# Patient Record
Sex: Female | Born: 1955 | Race: Black or African American | Hispanic: No | Marital: Single | Smoking: Never smoker
Health system: Southern US, Community
[De-identification: ages and names within clinical notes are randomized; demographics above are authoritative.]

## PROBLEM LIST (undated history)

## (undated) DIAGNOSIS — M1990 Unspecified osteoarthritis, unspecified site: Secondary | ICD-10-CM

## (undated) DIAGNOSIS — J189 Pneumonia, unspecified organism: Secondary | ICD-10-CM

## (undated) DIAGNOSIS — R0600 Dyspnea, unspecified: Secondary | ICD-10-CM

## (undated) DIAGNOSIS — E7800 Pure hypercholesterolemia, unspecified: Secondary | ICD-10-CM

## (undated) DIAGNOSIS — J45909 Unspecified asthma, uncomplicated: Secondary | ICD-10-CM

## (undated) DIAGNOSIS — K219 Gastro-esophageal reflux disease without esophagitis: Secondary | ICD-10-CM

## (undated) DIAGNOSIS — I10 Essential (primary) hypertension: Secondary | ICD-10-CM

## (undated) HISTORY — PX: OTHER SURGICAL HISTORY: SHX169

## (undated) HISTORY — PX: ABDOMINAL HYSTERECTOMY: SHX81

---

## 1997-12-05 ENCOUNTER — Emergency Department (HOSPITAL_COMMUNITY): Admission: EM | Admit: 1997-12-05 | Discharge: 1997-12-05 | Payer: Self-pay | Admitting: Emergency Medicine

## 1998-01-19 ENCOUNTER — Ambulatory Visit (HOSPITAL_COMMUNITY): Admission: RE | Admit: 1998-01-19 | Discharge: 1998-01-19 | Payer: Self-pay | Admitting: Family Medicine

## 1998-05-31 ENCOUNTER — Ambulatory Visit (HOSPITAL_COMMUNITY): Admission: RE | Admit: 1998-05-31 | Discharge: 1998-05-31 | Payer: Self-pay | Admitting: Otolaryngology

## 1999-01-23 ENCOUNTER — Ambulatory Visit (HOSPITAL_COMMUNITY): Admission: RE | Admit: 1999-01-23 | Discharge: 1999-01-23 | Payer: Self-pay | Admitting: Family Medicine

## 2000-08-20 ENCOUNTER — Encounter: Payer: Self-pay | Admitting: Emergency Medicine

## 2000-08-20 ENCOUNTER — Emergency Department (HOSPITAL_COMMUNITY): Admission: EM | Admit: 2000-08-20 | Discharge: 2000-08-20 | Payer: Self-pay | Admitting: Emergency Medicine

## 2004-05-08 ENCOUNTER — Ambulatory Visit: Payer: Self-pay | Admitting: Family Medicine

## 2004-05-29 ENCOUNTER — Ambulatory Visit: Payer: Self-pay | Admitting: Internal Medicine

## 2004-06-08 ENCOUNTER — Ambulatory Visit: Payer: Self-pay | Admitting: *Deleted

## 2004-07-23 ENCOUNTER — Ambulatory Visit: Payer: Self-pay | Admitting: Family Medicine

## 2004-09-13 ENCOUNTER — Ambulatory Visit: Payer: Self-pay | Admitting: Family Medicine

## 2004-09-21 ENCOUNTER — Ambulatory Visit: Payer: Self-pay | Admitting: Family Medicine

## 2004-12-31 ENCOUNTER — Ambulatory Visit: Payer: Self-pay | Admitting: Family Medicine

## 2005-03-07 ENCOUNTER — Ambulatory Visit: Payer: Self-pay | Admitting: Internal Medicine

## 2005-04-29 ENCOUNTER — Ambulatory Visit: Payer: Self-pay | Admitting: Internal Medicine

## 2005-06-14 ENCOUNTER — Ambulatory Visit: Payer: Self-pay | Admitting: Family Medicine

## 2005-12-09 ENCOUNTER — Ambulatory Visit: Payer: Self-pay | Admitting: Family Medicine

## 2005-12-18 ENCOUNTER — Ambulatory Visit (HOSPITAL_COMMUNITY): Admission: RE | Admit: 2005-12-18 | Discharge: 2005-12-18 | Payer: Self-pay | Admitting: Family Medicine

## 2005-12-25 ENCOUNTER — Ambulatory Visit: Payer: Self-pay | Admitting: Family Medicine

## 2006-05-01 ENCOUNTER — Ambulatory Visit: Payer: Self-pay | Admitting: Family Medicine

## 2006-06-19 ENCOUNTER — Ambulatory Visit: Payer: Self-pay | Admitting: Family Medicine

## 2006-08-19 ENCOUNTER — Ambulatory Visit: Payer: Self-pay | Admitting: Family Medicine

## 2006-10-14 ENCOUNTER — Ambulatory Visit: Payer: Self-pay | Admitting: Family Medicine

## 2007-01-29 ENCOUNTER — Ambulatory Visit: Payer: Self-pay | Admitting: Internal Medicine

## 2007-03-02 DIAGNOSIS — I10 Essential (primary) hypertension: Secondary | ICD-10-CM

## 2007-03-02 DIAGNOSIS — E785 Hyperlipidemia, unspecified: Secondary | ICD-10-CM

## 2007-03-02 DIAGNOSIS — J45909 Unspecified asthma, uncomplicated: Secondary | ICD-10-CM | POA: Insufficient documentation

## 2007-03-02 DIAGNOSIS — J309 Allergic rhinitis, unspecified: Secondary | ICD-10-CM

## 2007-03-02 DIAGNOSIS — K219 Gastro-esophageal reflux disease without esophagitis: Secondary | ICD-10-CM

## 2007-05-18 ENCOUNTER — Ambulatory Visit: Payer: Self-pay | Admitting: Internal Medicine

## 2007-05-20 ENCOUNTER — Encounter (INDEPENDENT_AMBULATORY_CARE_PROVIDER_SITE_OTHER): Payer: Self-pay | Admitting: *Deleted

## 2007-06-24 ENCOUNTER — Ambulatory Visit: Payer: Self-pay | Admitting: Internal Medicine

## 2007-07-09 ENCOUNTER — Ambulatory Visit: Payer: Self-pay | Admitting: Internal Medicine

## 2007-09-28 ENCOUNTER — Ambulatory Visit: Payer: Self-pay | Admitting: Internal Medicine

## 2008-01-05 ENCOUNTER — Encounter (INDEPENDENT_AMBULATORY_CARE_PROVIDER_SITE_OTHER): Payer: Self-pay | Admitting: Family Medicine

## 2008-01-05 ENCOUNTER — Ambulatory Visit: Payer: Self-pay | Admitting: Internal Medicine

## 2008-01-05 LAB — CONVERTED CEMR LAB
ALT: 9 units/L (ref 0–35)
BUN: 19 mg/dL (ref 6–23)
Basophils Absolute: 0 10*3/uL (ref 0.0–0.1)
CO2: 27 meq/L (ref 19–32)
Calcium: 10.1 mg/dL (ref 8.4–10.5)
Chloride: 100 meq/L (ref 96–112)
Cholesterol: 164 mg/dL (ref 0–200)
Creatinine, Ser: 0.8 mg/dL (ref 0.40–1.20)
Glucose, Bld: 73 mg/dL (ref 70–99)
HCT: 38.9 % (ref 36.0–46.0)
Hemoglobin: 12.5 g/dL (ref 12.0–15.0)
Lymphocytes Relative: 36 % (ref 12–46)
Lymphs Abs: 2.4 10*3/uL (ref 0.7–4.0)
Monocytes Absolute: 0.5 10*3/uL (ref 0.1–1.0)
Monocytes Relative: 8 % (ref 3–12)
Neutro Abs: 3.2 10*3/uL (ref 1.7–7.7)
RBC: 3.95 M/uL (ref 3.87–5.11)
Total CHOL/HDL Ratio: 2
Triglycerides: 87 mg/dL (ref <150)

## 2008-01-07 ENCOUNTER — Ambulatory Visit (HOSPITAL_COMMUNITY): Admission: RE | Admit: 2008-01-07 | Discharge: 2008-01-07 | Payer: Self-pay | Admitting: Family Medicine

## 2008-01-29 ENCOUNTER — Ambulatory Visit: Payer: Self-pay | Admitting: Internal Medicine

## 2008-02-09 ENCOUNTER — Ambulatory Visit: Payer: Self-pay | Admitting: Internal Medicine

## 2008-05-02 ENCOUNTER — Ambulatory Visit: Payer: Self-pay | Admitting: Family Medicine

## 2008-05-16 ENCOUNTER — Ambulatory Visit: Payer: Self-pay | Admitting: Internal Medicine

## 2008-07-05 ENCOUNTER — Emergency Department (HOSPITAL_COMMUNITY): Admission: EM | Admit: 2008-07-05 | Discharge: 2008-07-05 | Payer: Self-pay | Admitting: Family Medicine

## 2008-07-13 ENCOUNTER — Ambulatory Visit: Payer: Self-pay | Admitting: Internal Medicine

## 2008-07-13 ENCOUNTER — Encounter (INDEPENDENT_AMBULATORY_CARE_PROVIDER_SITE_OTHER): Payer: Self-pay | Admitting: Adult Health

## 2008-07-13 LAB — CONVERTED CEMR LAB
AST: 19 units/L (ref 0–37)
Alkaline Phosphatase: 67 units/L (ref 39–117)
BUN: 7 mg/dL (ref 6–23)
Basophils Relative: 1 % (ref 0–1)
Creatinine, Ser: 0.84 mg/dL (ref 0.40–1.20)
Eosinophils Absolute: 1.5 10*3/uL — ABNORMAL HIGH (ref 0.0–0.7)
MCHC: 33.5 g/dL (ref 30.0–36.0)
MCV: 93.9 fL (ref 78.0–100.0)
Monocytes Absolute: 0.3 10*3/uL (ref 0.1–1.0)
Monocytes Relative: 5 % (ref 3–12)
Neutrophils Relative %: 40 % — ABNORMAL LOW (ref 43–77)
Potassium: 3.4 meq/L — ABNORMAL LOW (ref 3.5–5.3)
RBC: 4.1 M/uL (ref 3.87–5.11)

## 2008-09-28 ENCOUNTER — Ambulatory Visit: Payer: Self-pay | Admitting: Internal Medicine

## 2008-09-28 ENCOUNTER — Encounter (INDEPENDENT_AMBULATORY_CARE_PROVIDER_SITE_OTHER): Payer: Self-pay | Admitting: Adult Health

## 2008-10-11 ENCOUNTER — Ambulatory Visit (HOSPITAL_COMMUNITY): Admission: RE | Admit: 2008-10-11 | Discharge: 2008-10-11 | Payer: Self-pay | Admitting: Family Medicine

## 2008-10-13 ENCOUNTER — Ambulatory Visit: Payer: Self-pay | Admitting: Internal Medicine

## 2008-10-13 ENCOUNTER — Encounter (INDEPENDENT_AMBULATORY_CARE_PROVIDER_SITE_OTHER): Payer: Self-pay | Admitting: Adult Health

## 2008-10-13 LAB — CONVERTED CEMR LAB
Albumin: 4.2 g/dL (ref 3.5–5.2)
CO2: 23 meq/L (ref 19–32)
Cholesterol: 172 mg/dL (ref 0–200)
Glucose, Bld: 83 mg/dL (ref 70–99)
Potassium: 3.6 meq/L (ref 3.5–5.3)
Sodium: 138 meq/L (ref 135–145)
Total Protein: 7.5 g/dL (ref 6.0–8.3)
Triglycerides: 59 mg/dL (ref <150)

## 2008-12-26 ENCOUNTER — Ambulatory Visit: Payer: Self-pay | Admitting: Internal Medicine

## 2009-01-20 ENCOUNTER — Ambulatory Visit: Payer: Self-pay | Admitting: Family Medicine

## 2009-01-31 ENCOUNTER — Ambulatory Visit: Payer: Self-pay | Admitting: Internal Medicine

## 2009-02-02 ENCOUNTER — Emergency Department (HOSPITAL_COMMUNITY): Admission: EM | Admit: 2009-02-02 | Discharge: 2009-02-02 | Payer: Self-pay | Admitting: Emergency Medicine

## 2009-02-09 ENCOUNTER — Ambulatory Visit: Payer: Self-pay | Admitting: Internal Medicine

## 2009-03-27 ENCOUNTER — Ambulatory Visit: Payer: Self-pay | Admitting: Internal Medicine

## 2009-05-22 ENCOUNTER — Encounter (INDEPENDENT_AMBULATORY_CARE_PROVIDER_SITE_OTHER): Payer: Self-pay | Admitting: Adult Health

## 2009-05-22 ENCOUNTER — Ambulatory Visit: Payer: Self-pay | Admitting: Internal Medicine

## 2009-05-22 LAB — CONVERTED CEMR LAB: Potassium: 3.9 meq/L (ref 3.5–5.3)

## 2009-06-15 ENCOUNTER — Ambulatory Visit: Payer: Self-pay | Admitting: Family Medicine

## 2009-06-21 ENCOUNTER — Emergency Department (HOSPITAL_COMMUNITY): Admission: EM | Admit: 2009-06-21 | Discharge: 2009-06-21 | Payer: Self-pay | Admitting: Emergency Medicine

## 2009-06-30 ENCOUNTER — Ambulatory Visit: Payer: Self-pay | Admitting: Internal Medicine

## 2009-06-30 ENCOUNTER — Encounter (INDEPENDENT_AMBULATORY_CARE_PROVIDER_SITE_OTHER): Payer: Self-pay | Admitting: Adult Health

## 2009-06-30 LAB — CONVERTED CEMR LAB
ALT: 11 units/L (ref 0–35)
AST: 19 units/L (ref 0–37)
Albumin: 4.4 g/dL (ref 3.5–5.2)
Alkaline Phosphatase: 53 units/L (ref 39–117)
BUN: 10 mg/dL (ref 6–23)
Basophils Absolute: 0.1 10*3/uL (ref 0.0–0.1)
Basophils Relative: 1 % (ref 0–1)
CO2: 25 meq/L (ref 19–32)
Calcium: 9.5 mg/dL (ref 8.4–10.5)
Chloride: 102 meq/L (ref 96–112)
Cholesterol: 149 mg/dL (ref 0–200)
Creatinine, Ser: 0.75 mg/dL (ref 0.40–1.20)
Eosinophils Absolute: 1 10*3/uL — ABNORMAL HIGH (ref 0.0–0.7)
Eosinophils Relative: 15 % — ABNORMAL HIGH (ref 0–5)
Glucose, Bld: 76 mg/dL (ref 70–99)
HCT: 38.9 % (ref 36.0–46.0)
HDL: 73 mg/dL (ref >39)
Hemoglobin: 12.8 g/dL (ref 12.0–15.0)
LDL Cholesterol: 55 mg/dL (ref 0–99)
Lymphocytes Relative: 35 % (ref 12–46)
Lymphs Abs: 2.3 10*3/uL (ref 0.7–4.0)
MCHC: 32.9 g/dL (ref 30.0–36.0)
MCV: 99 fL (ref 78.0–100.0)
Microalb, Ur: 0.5 mg/dL (ref 0.00–1.89)
Monocytes Absolute: 0.4 10*3/uL (ref 0.1–1.0)
Monocytes Relative: 6 % (ref 3–12)
Neutro Abs: 2.8 10*3/uL (ref 1.7–7.7)
Neutrophils Relative %: 43 % (ref 43–77)
Platelets: 443 10*3/uL — ABNORMAL HIGH (ref 150–400)
Potassium: 3.2 meq/L — ABNORMAL LOW (ref 3.5–5.3)
RBC: 3.93 M/uL (ref 3.87–5.11)
RDW: 14.5 % (ref 11.5–15.5)
Sodium: 141 meq/L (ref 135–145)
Total Bilirubin: 0.6 mg/dL (ref 0.3–1.2)
Total CHOL/HDL Ratio: 2
Total Protein: 7.2 g/dL (ref 6.0–8.3)
Triglycerides: 106 mg/dL (ref <150)
VLDL: 21 mg/dL (ref 0–40)
WBC: 6.6 10*3/uL (ref 4.0–10.5)

## 2009-07-01 ENCOUNTER — Encounter (INDEPENDENT_AMBULATORY_CARE_PROVIDER_SITE_OTHER): Payer: Self-pay | Admitting: Adult Health

## 2009-09-22 ENCOUNTER — Ambulatory Visit: Payer: Self-pay | Admitting: Internal Medicine

## 2009-10-26 ENCOUNTER — Ambulatory Visit: Payer: Self-pay | Admitting: Internal Medicine

## 2009-12-14 ENCOUNTER — Ambulatory Visit: Payer: Self-pay | Admitting: Family Medicine

## 2009-12-14 ENCOUNTER — Telehealth (INDEPENDENT_AMBULATORY_CARE_PROVIDER_SITE_OTHER): Payer: Self-pay | Admitting: *Deleted

## 2009-12-25 ENCOUNTER — Ambulatory Visit (HOSPITAL_COMMUNITY): Admission: RE | Admit: 2009-12-25 | Discharge: 2009-12-25 | Payer: Self-pay | Admitting: Family Medicine

## 2010-01-23 ENCOUNTER — Ambulatory Visit: Payer: Self-pay | Admitting: Internal Medicine

## 2010-01-25 ENCOUNTER — Ambulatory Visit (HOSPITAL_COMMUNITY): Admission: RE | Admit: 2010-01-25 | Discharge: 2010-01-25 | Payer: Self-pay | Admitting: Internal Medicine

## 2010-02-14 ENCOUNTER — Ambulatory Visit: Payer: Self-pay | Admitting: Internal Medicine

## 2010-02-20 ENCOUNTER — Encounter (INDEPENDENT_AMBULATORY_CARE_PROVIDER_SITE_OTHER): Payer: Self-pay | Admitting: Adult Health

## 2010-02-20 ENCOUNTER — Ambulatory Visit: Payer: Self-pay | Admitting: Internal Medicine

## 2010-02-20 LAB — CONVERTED CEMR LAB
ALT: 12 units/L (ref 0–35)
AST: 19 units/L (ref 0–37)
Albumin: 4.5 g/dL (ref 3.5–5.2)
Alkaline Phosphatase: 59 units/L (ref 39–117)
Calcium: 9.6 mg/dL (ref 8.4–10.5)
Chloride: 99 meq/L (ref 96–112)
LDL Cholesterol: 97 mg/dL (ref 0–99)
Potassium: 4.4 meq/L (ref 3.5–5.3)
Sodium: 135 meq/L (ref 135–145)
Total Protein: 7.3 g/dL (ref 6.0–8.3)
Vit D, 25-Hydroxy: 22 ng/mL — ABNORMAL LOW (ref 30–89)

## 2010-02-28 ENCOUNTER — Ambulatory Visit: Payer: Self-pay | Admitting: Internal Medicine

## 2010-03-07 ENCOUNTER — Ambulatory Visit (HOSPITAL_COMMUNITY): Admission: RE | Admit: 2010-03-07 | Discharge: 2010-03-07 | Payer: Self-pay | Admitting: Internal Medicine

## 2010-03-30 ENCOUNTER — Ambulatory Visit: Payer: Self-pay | Admitting: Internal Medicine

## 2010-04-10 ENCOUNTER — Ambulatory Visit: Payer: Self-pay | Admitting: Family Medicine

## 2010-10-02 NOTE — Progress Notes (Signed)
Summary: triage/possible sinus infection  Phone Note Call from Patient   Caller: triage/poss sinus infection Reason for Call: Talk to Nurse Summary of Call: patient has had a cold for over one week now..It started last Wednesday and she is now feeling sinus pressure and is blowing up yellow phlegm..She has a history of asthma and has been using her inhalers..but doesn't think they are working as she is having difficulty breathing.Marland KitchenMarland KitchenShe also has a history of HTN.Marland KitchenShe feels she now has a sinus infection.Marland KitchenMarland KitchenMarland KitchenAppointment made in Acute slot for this afternoon. Initial call taken by: Conchita Paris,  December 14, 2009 9:04 AM

## 2010-11-30 ENCOUNTER — Inpatient Hospital Stay (INDEPENDENT_AMBULATORY_CARE_PROVIDER_SITE_OTHER)
Admission: RE | Admit: 2010-11-30 | Discharge: 2010-11-30 | Disposition: A | Discharge status: Home / Self Care | Payer: Self-pay | Source: Ambulatory Visit | Attending: Family Medicine | Admitting: Family Medicine

## 2010-11-30 DIAGNOSIS — J309 Allergic rhinitis, unspecified: Secondary | ICD-10-CM

## 2010-12-05 ENCOUNTER — Other Ambulatory Visit (HOSPITAL_COMMUNITY): Payer: Self-pay | Admitting: Family Medicine

## 2010-12-05 DIAGNOSIS — J329 Chronic sinusitis, unspecified: Secondary | ICD-10-CM

## 2010-12-06 ENCOUNTER — Encounter (HOSPITAL_COMMUNITY): Payer: Self-pay

## 2010-12-06 ENCOUNTER — Ambulatory Visit (HOSPITAL_COMMUNITY)
Admission: RE | Admit: 2010-12-06 | Discharge: 2010-12-06 | Disposition: A | Discharge status: Home / Self Care | Payer: Self-pay | Source: Ambulatory Visit | Attending: Family Medicine | Admitting: Family Medicine

## 2010-12-06 DIAGNOSIS — K029 Dental caries, unspecified: Secondary | ICD-10-CM | POA: Insufficient documentation

## 2010-12-06 DIAGNOSIS — J329 Chronic sinusitis, unspecified: Secondary | ICD-10-CM

## 2010-12-06 DIAGNOSIS — J328 Other chronic sinusitis: Secondary | ICD-10-CM | POA: Insufficient documentation

## 2010-12-10 LAB — BASIC METABOLIC PANEL
CO2: 25 mEq/L (ref 19–32)
Calcium: 9.1 mg/dL (ref 8.4–10.5)
Chloride: 101 mEq/L (ref 96–112)
Creatinine, Ser: 0.9 mg/dL (ref 0.4–1.2)
Glucose, Bld: 114 mg/dL — ABNORMAL HIGH (ref 70–99)

## 2011-01-15 ENCOUNTER — Inpatient Hospital Stay (INDEPENDENT_AMBULATORY_CARE_PROVIDER_SITE_OTHER)
Admission: RE | Admit: 2011-01-15 | Discharge: 2011-01-15 | Disposition: A | Discharge status: Home / Self Care | Payer: Self-pay | Source: Ambulatory Visit | Attending: Emergency Medicine | Admitting: Emergency Medicine

## 2011-01-15 DIAGNOSIS — K047 Periapical abscess without sinus: Secondary | ICD-10-CM

## 2011-01-15 DIAGNOSIS — J019 Acute sinusitis, unspecified: Secondary | ICD-10-CM

## 2011-02-12 ENCOUNTER — Inpatient Hospital Stay (INDEPENDENT_AMBULATORY_CARE_PROVIDER_SITE_OTHER)
Admission: RE | Admit: 2011-02-12 | Discharge: 2011-02-12 | Disposition: A | Discharge status: Home / Self Care | Payer: Self-pay | Source: Ambulatory Visit | Attending: Family Medicine | Admitting: Family Medicine

## 2011-02-12 DIAGNOSIS — J310 Chronic rhinitis: Secondary | ICD-10-CM

## 2011-02-12 DIAGNOSIS — J019 Acute sinusitis, unspecified: Secondary | ICD-10-CM

## 2011-04-12 ENCOUNTER — Inpatient Hospital Stay (INDEPENDENT_AMBULATORY_CARE_PROVIDER_SITE_OTHER)
Admission: RE | Admit: 2011-04-12 | Discharge: 2011-04-12 | Disposition: A | Discharge status: Home / Self Care | Payer: Self-pay | Source: Ambulatory Visit | Attending: Family Medicine | Admitting: Family Medicine

## 2011-04-12 DIAGNOSIS — J019 Acute sinusitis, unspecified: Secondary | ICD-10-CM

## 2011-04-12 DIAGNOSIS — J310 Chronic rhinitis: Secondary | ICD-10-CM

## 2011-06-08 ENCOUNTER — Inpatient Hospital Stay (INDEPENDENT_AMBULATORY_CARE_PROVIDER_SITE_OTHER)
Admission: RE | Admit: 2011-06-08 | Discharge: 2011-06-08 | Disposition: A | Discharge status: Home / Self Care | Payer: Self-pay | Source: Ambulatory Visit | Attending: Family Medicine | Admitting: Family Medicine

## 2011-06-08 DIAGNOSIS — J019 Acute sinusitis, unspecified: Secondary | ICD-10-CM

## 2012-09-06 ENCOUNTER — Emergency Department (INDEPENDENT_AMBULATORY_CARE_PROVIDER_SITE_OTHER)
Admission: EM | Admit: 2012-09-06 | Discharge: 2012-09-06 | Disposition: A | Discharge status: Home / Self Care | Payer: Self-pay | Source: Home / Self Care

## 2012-09-06 ENCOUNTER — Encounter (HOSPITAL_COMMUNITY): Payer: Self-pay | Admitting: *Deleted

## 2012-09-06 DIAGNOSIS — S0180XA Unspecified open wound of other part of head, initial encounter: Secondary | ICD-10-CM

## 2012-09-06 DIAGNOSIS — S0181XA Laceration without foreign body of other part of head, initial encounter: Secondary | ICD-10-CM

## 2012-09-06 DIAGNOSIS — Z23 Encounter for immunization: Secondary | ICD-10-CM

## 2012-09-06 HISTORY — DX: Unspecified asthma, uncomplicated: J45.909

## 2012-09-06 MED ORDER — TETANUS-DIPHTH-ACELL PERTUSSIS 5-2.5-18.5 LF-MCG/0.5 IM SUSP
INTRAMUSCULAR | Status: AC
  Filled 2012-09-06: qty 0.5

## 2012-09-06 MED ORDER — TETANUS-DIPHTH-ACELL PERTUSSIS 5-2.5-18.5 LF-MCG/0.5 IM SUSP
0.5000 mL | Freq: Once | INTRAMUSCULAR | Status: AC
  Administered 2012-09-06: 0.5 mL via INTRAMUSCULAR

## 2012-09-06 NOTE — ED Provider Notes (Signed)
Medical screening examination/treatment/procedure(s) were performed by [facility] fellow and as supervising physician I was immediately available for consultation/collaboration.   Sharin Grave, MD   Sharin Grave, MD 09/06/12 [phone]

## 2012-09-06 NOTE — ED Notes (Signed)
Tripped on porch last night and fell face down.  Laceration to L forehead.  Bleeding stopped.

## 2012-09-06 NOTE — ED Provider Notes (Signed)
Angel Hebert is a 57 y.o. female who presents to Urgent Care today for for head laceration. Patient fell last night at about 6:30 and hit her head on the concrete outside of her house.  She applied compression to control bleeding and feels well overnight.  She denies any headache blurry vision or dizziness and feels well otherwise. She denies any loss of consciousness with the injury. She denies any fevers or chill. Patient cannot remember her last tetanus shot.    PMH reviewed. Asthma, and hypertension History  Substance Use Topics  . Smoking status: Not on file  . Smokeless tobacco: Not on file  . Alcohol Use: Not on file   ROS as above Medications reviewed. Current Facility-Administered Medications  Medication Dose Route Frequency Provider Last Rate Last Dose  . TDaP (BOOSTRIX) injection 0.5 mL  0.5 mL Intramuscular Once Rodolph Bong, MD       No current outpatient prescriptions on file.    Exam:  BP 140/78  Pulse 91  Temp 98.8 F (37.1 C) (Oral)  SpO2 99% Gen: Well NAD FOR HEAD: Small 1 cm linear laceration across the for head on the left side.  It is well approximated and not bleeding.  Neuro: Alert and oriented normally conversant normal gait.   No results found for this or any previous visit (from the past 24 hour(s)). No results found.  Assessment and Plan: 57 y.o. female with for head laceration. Unfortunately her laceration is about 20 hours old and not amenable to primary closure tonight.   The wound was irrigated.  A dressing was applied. Will the wound heal with secondary intent.  Will administer a tetanus posterior today.      Rodolph Bong, MD 09/06/12 [phone]

## 2012-09-06 NOTE — ED Notes (Signed)
Pt states she fell last night hitting head on cement laceration to left side forehead above left eye. Bleeding is controlled. Some swelling, ice applied. Pt is in no distress. Pt seated back in waiting area. Mw,cma

## 2012-09-09 ENCOUNTER — Emergency Department (INDEPENDENT_AMBULATORY_CARE_PROVIDER_SITE_OTHER)
Admission: EM | Admit: 2012-09-09 | Discharge: 2012-09-09 | Disposition: A | Discharge status: Home / Self Care | Payer: Self-pay | Source: Home / Self Care | Attending: Family Medicine | Admitting: Family Medicine

## 2012-09-09 ENCOUNTER — Encounter (HOSPITAL_COMMUNITY): Payer: Self-pay | Admitting: *Deleted

## 2012-09-09 DIAGNOSIS — J329 Chronic sinusitis, unspecified: Secondary | ICD-10-CM

## 2012-09-09 DIAGNOSIS — S0180XA Unspecified open wound of other part of head, initial encounter: Secondary | ICD-10-CM

## 2012-09-09 MED ORDER — PREDNISONE 10 MG PO TABS: ORAL_TABLET | ORAL | Status: DC

## 2012-09-09 MED ORDER — AMOXICILLIN-POT CLAVULANATE 875-125 MG PO TABS: 1.0000 | ORAL_TABLET | Freq: Two times a day (BID) | ORAL | Status: DC

## 2012-09-09 MED ORDER — HYDROCODONE-HOMATROPINE 5-1.5 MG/5ML PO SYRP: 5.0000 mL | ORAL_SOLUTION | Freq: Four times a day (QID) | ORAL | Status: DC | PRN

## 2012-09-09 NOTE — ED Notes (Signed)
Pt  Reports  Symptoms  Of  Nasal  stuffyness   And  Congestion      With  Yellow phlegm     and  Mucous  Production     Since last  Week         Pt  Reports  History of  Asthma         She  Is  Masked  In a  Private  Room  And   Is  Ion no  Severe  distress

## 2012-09-09 NOTE — ED Provider Notes (Signed)
History     CSN: [id number]  Arrival date & time 09/09/12  1122   First MD Initiated Contact with Patient 09/09/12 1329      Chief Complaint  Patient presents with  . URI    (Consider location/radiation/quality/duration/timing/severity/associated sxs/prior treatment) Patient is a 57 y.o. female presenting with cough. The history is provided by the patient. No language interpreter was used.  Cough This is a new problem. The current episode started more than 1 week ago. The problem occurs constantly. The problem has been gradually worsening. The cough is productive of sputum. There has been no fever. Associated symptoms include ear congestion, headaches, rhinorrhea and sore throat.  Pt reports she has a history of sinus problems and polyps.   Pt is requesting rx for augmentin and prednisone.   Pt reports she has had in the past  Past Medical History  Diagnosis Date  . Asthma     Past Surgical History  Procedure Date  . Abdominal hysterectomy     No family history on file.  History  Substance Use Topics  . Smoking status: Never Smoker   . Smokeless tobacco: Not on file  . Alcohol Use: No    OB History    Grav Para Term Preterm Abortions TAB SAB Ect Mult Living                  Review of Systems  HENT: Positive for sore throat and rhinorrhea.   Respiratory: Positive for cough.   Neurological: Positive for headaches.  All other systems reviewed and are negative.    Allergies  Review of patient's allergies indicates no known allergies.  Home Medications   Current Outpatient Rx  Name  Route  Sig  Dispense  Refill  . ALBUTEROL SULFATE HFA 108 (90 BASE) MCG/ACT IN AERS   Inhalation   Inhale 2 puffs into the lungs every 6 (six) hours as needed.         . ATORVASTATIN CALCIUM 10 MG PO TABS   Oral   Take 10 mg by mouth daily.         Marland Kitchen FLUTICASONE-SALMETEROL 500-50 MCG/DOSE IN AEPB   Inhalation   Inhale 1 puff into the lungs every 12 (twelve) hours.        . IRBESARTAN 150 MG PO TABS   Oral   Take 150 mg by mouth daily after breakfast.         . LORATADINE 10 MG PO TABS   Oral   Take 10 mg by mouth daily.         Marland Kitchen MONTELUKAST SODIUM 10 MG PO TABS   Oral   Take 10 mg by mouth at bedtime.         Marland Kitchen PANTOPRAZOLE SODIUM 20 MG PO TBEC   Oral   Take 20 mg by mouth daily.         . TRAMADOL HCL 50 MG PO TABS   Oral   Take 50 mg by mouth every 6 (six) hours as needed.         . TRAZODONE HCL 50 MG PO TABS   Oral   Take 50 mg by mouth at bedtime.           BP 134/86  Pulse 91  Temp 97.8 F (36.6 C) (Oral)  Resp 16  SpO2 97%  Physical Exam  Nursing note and vitals reviewed. Constitutional: She is oriented to person, place, and time. She appears well-developed and well-nourished.  HENT:  Head: Normocephalic and atraumatic.  Right Ear: External ear normal.  Left Ear: External ear normal.  Nose: Nose normal.  Mouth/Throat: Oropharynx is clear and moist.       Tender maxillary sinuses,    Eyes: Conjunctivae normal and EOM are normal. Pupils are equal, round, and reactive to light.  Neck: Normal range of motion. Neck supple.  Cardiovascular: Normal rate and normal heart sounds.   Pulmonary/Chest: Effort normal and breath sounds normal.  Abdominal: Soft. Bowel sounds are normal.  Musculoskeletal: Normal range of motion.  Neurological: She is alert and oriented to person, place, and time. She has normal reflexes.  Skin: Skin is warm.  Psychiatric: She has a normal mood and affect.    ED Course  Procedures (including critical care time)  Labs Reviewed - No data to display No results found.   No diagnosis found.    MDM  augmentin and prednisone taper,   Pt request medication for cough.   Pt given rx for hycodan.       Lonia Skinner [place], [place] 09/09/12 1336

## 2012-09-11 NOTE — ED Provider Notes (Signed)
Medical screening examination/treatment/procedure(s) were performed by non-physician practitioner and as supervising physician I was immediately available for consultation/collaboration.   Hebert,Angel Irving; MD   Sonnie Pawloski Moreno-Coll, MD 09/11/12 0837 

## 2012-10-03 ENCOUNTER — Encounter (HOSPITAL_COMMUNITY): Payer: Self-pay | Admitting: Emergency Medicine

## 2012-10-03 ENCOUNTER — Emergency Department (INDEPENDENT_AMBULATORY_CARE_PROVIDER_SITE_OTHER): Payer: Self-pay

## 2012-10-03 ENCOUNTER — Emergency Department (INDEPENDENT_AMBULATORY_CARE_PROVIDER_SITE_OTHER)
Admission: EM | Admit: 2012-10-03 | Discharge: 2012-10-03 | Disposition: A | Discharge status: Home / Self Care | Payer: Self-pay | Source: Home / Self Care | Attending: Emergency Medicine | Admitting: Emergency Medicine

## 2012-10-03 DIAGNOSIS — M1990 Unspecified osteoarthritis, unspecified site: Secondary | ICD-10-CM

## 2012-10-03 DIAGNOSIS — S0180XA Unspecified open wound of other part of head, initial encounter: Secondary | ICD-10-CM

## 2012-10-03 DIAGNOSIS — Z23 Encounter for immunization: Secondary | ICD-10-CM

## 2012-10-03 DIAGNOSIS — M109 Gout, unspecified: Secondary | ICD-10-CM

## 2012-10-03 DIAGNOSIS — E785 Hyperlipidemia, unspecified: Secondary | ICD-10-CM

## 2012-10-03 DIAGNOSIS — J45909 Unspecified asthma, uncomplicated: Secondary | ICD-10-CM

## 2012-10-03 MED ORDER — MELOXICAM 15 MG PO TABS: 15.0000 mg | ORAL_TABLET | Freq: Every day | ORAL | Status: DC

## 2012-10-03 MED ORDER — ALBUTEROL SULFATE HFA 108 (90 BASE) MCG/ACT IN AERS: 1.0000 | INHALATION_SPRAY | Freq: Four times a day (QID) | RESPIRATORY_TRACT | Status: DC | PRN

## 2012-10-03 MED ORDER — COLCHICINE 0.6 MG PO TABS: ORAL_TABLET | ORAL | Status: DC

## 2012-10-03 MED ORDER — FLUTICASONE-SALMETEROL 500-50 MCG/DOSE IN AEPB: 1.0000 | INHALATION_SPRAY | Freq: Two times a day (BID) | RESPIRATORY_TRACT | Status: DC

## 2012-10-03 NOTE — ED Notes (Addendum)
Pt is here for left knee pain x1 week Sx include: swelling, pain increases w/activity; 10/10 on pain scale Pain is also worse at night Has not taken any meds other than her tramadol  No hx of arthritis  She is alert w/no signs of acute distress.

## 2012-10-03 NOTE — ED Provider Notes (Signed)
Chief Complaint  Patient presents with  . Knee Pain    History of Present Illness:   Angel Hebert is a 57 year old female who has had a one-week history of left knee pain and swelling. She denies any injury. She has had no fever, chills, history of gout, arthritis, or prior knee problems. She has giving way and locking of the joint. It hurts to walk, to stand, going up and down stairs, and with prolonged sitting.  Review of Systems:  Other than noted above, the patient denies any of the following symptoms: Systemic:  No fevers, chills, sweats, or aches.  No fatigue or tiredness. Musculoskeletal:  No joint pain, arthritis, bursitis, swelling, back pain, or neck pain.  Neurological:  No muscular weakness, paresthesias, headache, or trouble with speech or coordination.  No dizziness.   PMFSH:  Past medical history, family history, social history, meds, and allergies were reviewed.  No prior history of knee pain or arthritis.  Physical Exam:   Vital signs:  BP 103/79  Pulse 90  Temp 98.1 F (36.7 C) (Oral)  Resp 18  SpO2 99% Gen:  Alert and oriented times 3.  In no distress. Musculoskeletal: The knee was visibly swollen and has a palpable effusion with ballottement of the patella. There is overall tenderness to palpation, not localized to one particular area. There is no erythema or warmth. The knee has a slightly limited range of motion but she is able to flex past 90. It does hurt to move.   McMurray's test was negative.  Lachman's test was negative.  Anterior drawer test was negative.   Varus and valgus stress negative.  Otherwise, all joints had a full a ROM with no swelling, bruising or deformity.  No edema, pulses full. Extremities were warm and pink.  Capillary refill was brisk.  Skin:  Clear, warm and dry.  No rash. Neuro:  Alert and oriented times 3.  Muscle strength was normal.  Sensation was intact to light touch.   Radiology:  Dg Knee Complete 4 Views Left  10/03/2012  *RADIOLOGY  REPORT*  Clinical Data: Left knee pain and swelling for 1 week, no known injury  LEFT KNEE - COMPLETE 4+ VIEW  Comparison: None.  Findings:  No fracture or dislocation.  Moderate to severe tricompartmental degenerative change, worse within the medial compartment joint space loss, subchondral sclerosis and osteophytosis.  Moderate sized suprapatellar joint effusion.  Enthesopathic change of the superior pole of the patella.  No radiopaque foreign body.  IMPRESSION:  1.  No fracture. 2.  Moderate to severe degenerative changes medial compartment of the knee with associated moderate sized suprapatellar joint effusion.   Original Report Authenticated By: Tacey Ruiz, MD    I reviewed the images independently and personally and concur with the radiologist's findings.  Results for orders placed during the hospital encounter of 10/03/12  URIC ACID      Component Value Range   Uric Acid, Serum 3.6  2.4 - 7.0 mg/dL   Assessment:  The primary encounter diagnosis was Gout. Diagnoses of Osteoarthritis and ASTHMA were also pertinent to this visit.  The uric acid level was not back at that time the patient left, thus I was thinking that gout would be a possible cause for this versus an acute flareup of osteoarthritis. I think it's worthwhile to try a trial of colchicine to see if it works since uric acid can be normal even with a gout attack. I will call back tomorrow to let her  know about this result. She has meloxicam to take for pain and I told her to followup with a primary care physician within the next week. In addition to the albuterol I also refilled her Advair.  Plan:   1.  The following meds were prescribed:   New Prescriptions   ALBUTEROL (PROVENTIL HFA;VENTOLIN HFA) 108 (90 BASE) MCG/ACT INHALER    Inhale 1-2 puffs into the lungs every 6 (six) hours as needed for wheezing.   COLCHICINE 0.6 MG TABLET    Take 2 now and 1 in 1 hour.  May repeat dose once daily.  For gout attack.   MELOXICAM (MOBIC) 15  MG TABLET    Take 1 tablet (15 mg total) by mouth daily.   2.  The patient was instructed in symptomatic care, including rest and activity, elevation, application of ice and compression.  Appropriate handouts were given. 3.  The patient was told to return if becoming worse in any way, if no better in 3 or 4 days, and given some red flag symptoms that would indicate earlier return.   4.  The patient was told to follow up with a primary care physician within a week.    Reuben Likes, MD 10/03/12 [phone]

## 2012-10-05 NOTE — ED Notes (Signed)
Patient called, asking about her lab reports; uric acid level normal range, x-rays show degenerative changes. Discussed medication compliance, and to try to sign up for [facility] card.

## 2012-10-08 ENCOUNTER — Emergency Department (INDEPENDENT_AMBULATORY_CARE_PROVIDER_SITE_OTHER)
Admission: EM | Admit: 2012-10-08 | Discharge: 2012-10-08 | Disposition: A | Discharge status: Home / Self Care | Payer: Self-pay | Source: Home / Self Care | Attending: Family Medicine | Admitting: Family Medicine

## 2012-10-08 ENCOUNTER — Encounter (HOSPITAL_COMMUNITY): Payer: Self-pay

## 2012-10-08 DIAGNOSIS — Z23 Encounter for immunization: Secondary | ICD-10-CM

## 2012-10-08 DIAGNOSIS — E785 Hyperlipidemia, unspecified: Secondary | ICD-10-CM

## 2012-10-08 DIAGNOSIS — I10 Essential (primary) hypertension: Secondary | ICD-10-CM

## 2012-10-08 DIAGNOSIS — S0180XA Unspecified open wound of other part of head, initial encounter: Secondary | ICD-10-CM

## 2012-10-08 LAB — POCT URINALYSIS DIP (DEVICE)
Bilirubin Urine: NEGATIVE
Glucose, UA: NEGATIVE mg/dL
Nitrite: NEGATIVE
Specific Gravity, Urine: 1.015 (ref 1.005–1.030)
Urobilinogen, UA: 0.2 mg/dL (ref 0.0–1.0)
pH: 5.5 (ref 5.0–8.0)

## 2012-10-08 MED ORDER — HYDROCODONE-ACETAMINOPHEN 5-325 MG PO TABS: 1.0000 | ORAL_TABLET | Freq: Four times a day (QID) | ORAL | Status: DC | PRN

## 2012-10-08 MED ORDER — SULFAMETHOXAZOLE-TMP DS 800-160 MG PO TABS: 1.0000 | ORAL_TABLET | Freq: Two times a day (BID) | ORAL | Status: DC

## 2012-10-08 NOTE — ED Provider Notes (Signed)
History   CSN: [id number]  Arrival date & time 10/08/12  1040   First MD Initiated Contact with Patient 10/08/12 1150     Chief Complaint  Patient presents with  . Leg Pain   HPI Pt is having sig pain in the left knee.  Pt says that she hurts all the time. She is taking mobic 15 mg which she got from urgent care.  She says that her symptoms have improved but still having pain and swelling in the knee.  Pt is asking for an orthopedic referral for evaluation.    Past Medical History  Diagnosis Date  . Asthma     Past Surgical History  Procedure Date  . Abdominal hysterectomy     No family history on file.  History  Substance Use Topics  . Smoking status: Never Smoker   . Smokeless tobacco: Not on file  . Alcohol Use: No    OB History    Grav Para Term Preterm Abortions TAB SAB Ect Mult Living                 Review of Systems  Musculoskeletal: Positive for joint swelling and arthralgias.       Left knee pain and swelling   All other systems reviewed and are negative.   Allergies  Review of patient's allergies indicates no known allergies.  Home Medications   Current Outpatient Rx  Name  Route  Sig  Dispense  Refill  . ALBUTEROL SULFATE HFA 108 (90 BASE) MCG/ACT IN AERS   Inhalation   Inhale 2 puffs into the lungs every 6 (six) hours as needed.         . ALBUTEROL SULFATE HFA 108 (90 BASE) MCG/ACT IN AERS   Inhalation   Inhale 1-2 puffs into the lungs every 6 (six) hours as needed for wheezing.   1 Inhaler   2   . AMOXICILLIN-POT CLAVULANATE 875-125 MG PO TABS   Oral   Take 1 tablet by mouth every 12 (twelve) hours.   20 tablet   0   . ATORVASTATIN CALCIUM 10 MG PO TABS   Oral   Take 10 mg by mouth daily.         . COLCHICINE 0.6 MG PO TABS      Take 2 now and 1 in 1 hour.  May repeat dose once daily.  For gout attack.   12 tablet   2   . FLUTICASONE-SALMETEROL 500-50 MCG/DOSE IN AEPB   Inhalation   Inhale 1 puff into the lungs every 12  (twelve) hours.   60 each   2   . HYDROCODONE-HOMATROPINE 5-1.5 MG/5ML PO SYRP   Oral   Take 5 mLs by mouth every 6 (six) hours as needed for cough.   120 mL   0   . IRBESARTAN 150 MG PO TABS   Oral   Take 150 mg by mouth daily after breakfast.         . LORATADINE 10 MG PO TABS   Oral   Take 10 mg by mouth daily.         . MELOXICAM 15 MG PO TABS   Oral   Take 1 tablet (15 mg total) by mouth daily.   15 tablet   2   . MONTELUKAST SODIUM 10 MG PO TABS   Oral   Take 10 mg by mouth at bedtime.         Marland Kitchen PANTOPRAZOLE SODIUM 20 MG PO TBEC  Oral   Take 20 mg by mouth daily.         Marland Kitchen PREDNISONE 10 MG PO TABS      6,6,5,5,4,4,3,3,2,2,1,1 taper   42 tablet   0   . TRAMADOL HCL 50 MG PO TABS   Oral   Take 50 mg by mouth every 6 (six) hours as needed.         . TRAZODONE HCL 50 MG PO TABS   Oral   Take 50 mg by mouth at bedtime.           BP 120/70  Pulse 85  Temp 97.9 F (36.6 C) (Oral)  Resp 19  SpO2 99%  Physical Exam  Nursing note and vitals reviewed. Constitutional: She is oriented to person, place, and time. She appears well-developed and well-nourished.  HENT:  Head: Normocephalic and atraumatic.  Eyes: EOM are normal. Pupils are equal, round, and reactive to light.  Neck: Normal range of motion. Neck supple.  Cardiovascular: Normal rate, regular rhythm and normal heart sounds.   Pulmonary/Chest: Effort normal.  Abdominal: Soft. Bowel sounds are normal.  Musculoskeletal: Normal range of motion. She exhibits tenderness.       Left knee tenderness, effusion   Neurological: She is alert and oriented to person, place, and time.  Skin: Skin is warm and dry.  Psychiatric: She has a normal mood and affect. Her behavior is normal. Judgment and thought content normal.    ED Course  Procedures (including critical care time)  Labs Reviewed - No data to display No results found.  1. HYPERLIPIDEMIA   2. HYPERTENSION   3.  Severe OA of  left knee 4.  Chronic knee pain   MDM   RECOMMENDATIONS / PLAN Continue mobic 15 mg po daily Hydrocodone APAP 5/325  - take 1 po every 8  hour prn severe knee pain  Referral to orthopedics  FOLLOW UP 2 months   The patient was given clear instructions to go to ER or return to medical center if symptoms don't improve, worsen or new problems develop.  The patient verbalized understanding.  The patient was told to call to get lab results if they haven't heard anything in the next week.            Cleora Fleet, MD 10/09/12 1018

## 2012-10-08 NOTE — ED Notes (Signed)
Patient complains of left leg pain-not getting any better, frequent urination ,urine is cloudy and burns

## 2012-12-09 ENCOUNTER — Encounter (HOSPITAL_COMMUNITY): Payer: Self-pay | Admitting: *Deleted

## 2012-12-09 ENCOUNTER — Emergency Department (INDEPENDENT_AMBULATORY_CARE_PROVIDER_SITE_OTHER)
Admission: EM | Admit: 2012-12-09 | Discharge: 2012-12-09 | Disposition: A | Discharge status: Home / Self Care | Payer: Self-pay | Source: Home / Self Care | Attending: Family Medicine | Admitting: Family Medicine

## 2012-12-09 DIAGNOSIS — J329 Chronic sinusitis, unspecified: Secondary | ICD-10-CM

## 2012-12-09 MED ORDER — CETIRIZINE HCL 10 MG PO TABS: 10.0000 mg | ORAL_TABLET | Freq: Every day | ORAL | Status: DC

## 2012-12-09 MED ORDER — AMOXICILLIN-POT CLAVULANATE 500-125 MG PO TABS: 1.0000 | ORAL_TABLET | Freq: Three times a day (TID) | ORAL | Status: DC

## 2012-12-09 MED ORDER — PREDNISONE 20 MG PO TABS: ORAL_TABLET | ORAL | Status: DC

## 2012-12-09 MED ORDER — FLUTICASONE PROPIONATE 50 MCG/ACT NA SUSP: 2.0000 | Freq: Every day | NASAL | Status: DC

## 2012-12-09 NOTE — ED Notes (Signed)
Pt  Reports  Symptoms  Of  Nasal  Congestion  Stuffy  Nose             As  Well  As      Sinus  Drainage        -    Pt  Reports  The  Symptoms  Since  Last week       She  Is  Sitting upright on  Exam table  Speaking in  Complete  Sentence   Talking on  Her  Cell  Phone   She appears  In  no acute  Distress

## 2012-12-09 NOTE — ED Provider Notes (Signed)
History     CSN: [id number]  Arrival date & time 12/09/12  1002   First MD Initiated Contact with Patient 12/09/12 1014      Chief Complaint  Patient presents with  . URI    (Consider location/radiation/quality/duration/timing/severity/associated sxs/prior treatment) HPI Comments: 57 year old female with history of hypertension and recurrent and sinusitis among other comorbidities. Here complaining of nasal congestion sinus pressure and thick yellow rhinorrhea for over a week. Denies fever or chills. No chest pain or difficulty breathing. Appetite is okay. Patient was found incidentally hypertensive today with a blood pressure 160/102. Patient states she has not taken her blood pressure medications today as she was not feeling well. Denies headache or visual changes. Denies chest pain or leg swelling. Patient states she has refills on her blood pressure medications.   Past Medical History  Diagnosis Date  . Asthma     Past Surgical History  Procedure Laterality Date  . Abdominal hysterectomy      History reviewed. No pertinent family history.  History  Substance Use Topics  . Smoking status: Never Smoker   . Smokeless tobacco: Not on file  . Alcohol Use: No    OB History   Grav Para Term Preterm Abortions TAB SAB Ect Mult Living                  Review of Systems  Constitutional: Negative for fever, chills, appetite change and fatigue.  HENT: Positive for congestion, rhinorrhea, sneezing and postnasal drip. Negative for ear pain, sore throat and trouble swallowing.   Eyes: Negative for discharge.  Respiratory: Positive for cough. Negative for shortness of breath and wheezing.   Cardiovascular: Negative for chest pain and leg swelling.  Gastrointestinal: Negative for nausea, vomiting and abdominal pain.  Neurological: Negative for weakness, numbness and headaches.  All other systems reviewed and are negative.    Allergies  Review of patient's allergies indicates  no known allergies.  Home Medications   Current Outpatient Rx  Name  Route  Sig  Dispense  Refill  . albuterol (PROVENTIL HFA;VENTOLIN HFA) 108 (90 BASE) MCG/ACT inhaler   Inhalation   Inhale 1-2 puffs into the lungs every 6 (six) hours as needed for wheezing.   1 Inhaler   2   . amoxicillin-clavulanate (AUGMENTIN) 500-125 MG per tablet   Oral   Take 1 tablet (500 mg total) by mouth 3 (three) times daily.   30 tablet   0   . atorvastatin (LIPITOR) 10 MG tablet   Oral   Take 10 mg by mouth daily.         . cetirizine (ZYRTEC) 10 MG tablet   Oral   Take 1 tablet (10 mg total) by mouth daily.   30 tablet   0   . colchicine 0.6 MG tablet      Take 2 now and 1 in 1 hour.  May repeat dose once daily.  For gout attack.   12 tablet   2   . fluticasone (FLONASE) 50 MCG/ACT nasal spray   Nasal   Place 2 sprays into the nose daily.   16 g   0   . Fluticasone-Salmeterol (ADVAIR) 500-50 MCG/DOSE AEPB   Inhalation   Inhale 1 puff into the lungs every 12 (twelve) hours.   60 each   2   . HYDROcodone-acetaminophen (NORCO/VICODIN) 5-325 MG per tablet   Oral   Take 1 tablet by mouth every 6 (six) hours as needed for pain.   25  tablet   0   . irbesartan (AVAPRO) 150 MG tablet   Oral   Take 150 mg by mouth daily after breakfast.         . meloxicam (MOBIC) 15 MG tablet   Oral   Take 1 tablet (15 mg total) by mouth daily.   15 tablet   2   . montelukast (SINGULAIR) 10 MG tablet   Oral   Take 10 mg by mouth at bedtime.         . pantoprazole (PROTONIX) 20 MG tablet   Oral   Take 20 mg by mouth daily.         . predniSONE (DELTASONE) 20 MG tablet      2 tabs by mouth daily for 5 days   10 tablet   0   . traMADol (ULTRAM) 50 MG tablet   Oral   Take 50 mg by mouth every 6 (six) hours as needed.         . traZODone (DESYREL) 50 MG tablet   Oral   Take 50 mg by mouth at bedtime.           BP 160/102  Pulse 72  Temp(Src) 98.6 F (37 C) (Oral)   Resp 16  SpO2 100%  Physical Exam  Nursing note and vitals reviewed. Constitutional: She is oriented to person, place, and time. She appears well-developed and well-nourished. No distress.  HENT:  Head: Normocephalic and atraumatic.  Mouth/Throat: No oropharyngeal exudate.  Nasal Congestion with erythema and swelling of nasal turbinates, thick rhinorrhea. Nasal voice pharyngeal erythema no exudates. No uvula deviation. No trismus. TM's normal.  Eyes: Conjunctivae and EOM are normal. Pupils are equal, round, and reactive to light. Right eye exhibits no discharge. Left eye exhibits no discharge.  Neck: No JVD present. No thyromegaly present.  Cardiovascular: Normal rate, regular rhythm and normal heart sounds.   No leg swelling  Pulmonary/Chest: Effort normal and breath sounds normal. No respiratory distress. She has no wheezes. She has no rales. She exhibits no tenderness.  Lymphadenopathy:    She has no cervical adenopathy.  Neurological: She is alert and oriented to person, place, and time.  Skin: No rash noted. She is not diaphoretic.    ED Course  Procedures (including critical care time)  Labs Reviewed - No data to display No results found.   1. Sinusitis       MDM  Treated with Augmentin, prednisone, cetirizine and Flonase. Asked to take her blood pressure medications as soon as she gets home. Patient was asked to followup with her primary care provider next week for blood pressure rechecked and to monitor her symptoms. Supportive care and red flags that should prompt her return to medical attention discussed with patient and provided in writing.        Sharin Grave, MD 12/09/12 1059

## 2013-02-15 ENCOUNTER — Encounter (HOSPITAL_COMMUNITY): Payer: Self-pay | Admitting: Emergency Medicine

## 2013-02-15 ENCOUNTER — Emergency Department (HOSPITAL_COMMUNITY)
Admission: EM | Admit: 2013-02-15 | Discharge: 2013-02-15 | Discharge status: Against Medical Advice | Payer: Self-pay | Attending: Emergency Medicine | Admitting: Emergency Medicine

## 2013-02-15 DIAGNOSIS — M25569 Pain in unspecified knee: Secondary | ICD-10-CM | POA: Insufficient documentation

## 2013-02-15 DIAGNOSIS — J45909 Unspecified asthma, uncomplicated: Secondary | ICD-10-CM | POA: Insufficient documentation

## 2013-02-15 DIAGNOSIS — I10 Essential (primary) hypertension: Secondary | ICD-10-CM | POA: Insufficient documentation

## 2013-02-15 HISTORY — DX: Essential (primary) hypertension: I10

## 2013-02-15 HISTORY — DX: Pure hypercholesterolemia, unspecified: E78.00

## 2013-02-15 HISTORY — DX: Gastro-esophageal reflux disease without esophagitis: K21.9

## 2013-02-15 NOTE — ED Notes (Signed)
Pt c/o left knee pain w/ ambulation and swelling x  2 months

## 2013-03-03 ENCOUNTER — Ambulatory Visit: Payer: No Typology Code available for payment source | Attending: Family Medicine | Admitting: Internal Medicine

## 2013-03-03 ENCOUNTER — Encounter: Payer: Self-pay | Admitting: Internal Medicine

## 2013-03-03 VITALS — BP 149/88 | HR 94 | Temp 98.0°F | Resp 18 | Ht 67.0 in | Wt 143.8 lb

## 2013-03-03 DIAGNOSIS — K219 Gastro-esophageal reflux disease without esophagitis: Secondary | ICD-10-CM

## 2013-03-03 DIAGNOSIS — M25462 Effusion, left knee: Secondary | ICD-10-CM

## 2013-03-03 DIAGNOSIS — M25469 Effusion, unspecified knee: Secondary | ICD-10-CM

## 2013-03-03 NOTE — Progress Notes (Signed)
Patient ID: Angel Hebert, female   DOB: 09-01-1956, 57 y.o.   MRN: [id number]   CC: fluid in the left knee  HPI: Patient is a 57 year old female has chronic left knee pain and had x-ray of the left knee done several months prior to this visit indicating moderate to severe amount of fluid in the left knee. She explains she has tried to have somebody drained of fluid but nobody is available as he has no insurance. She has orange card and is trying to find out if anybody would take her to do the fluid drained. She denies fevers and chills, no difficulty ambulation, no specific trauma, no recent sicknesses or hospitalizations.  No Known Allergies Past Medical History  Diagnosis Date  . Asthma   . High cholesterol   . Hypertension   . GERD (gastroesophageal reflux disease)    Current Outpatient Prescriptions on File Prior to Visit  Medication Sig Dispense Refill  . albuterol (PROVENTIL HFA;VENTOLIN HFA) 108 (90 BASE) MCG/ACT inhaler Inhale 1-2 puffs into the lungs every 6 (six) hours as needed for wheezing.  1 Inhaler  2  . atorvastatin (LIPITOR) 10 MG tablet Take 10 mg by mouth daily.      . cetirizine (ZYRTEC) 10 MG tablet Take 1 tablet (10 mg total) by mouth daily.  30 tablet  0  . fluticasone (FLONASE) 50 MCG/ACT nasal spray Place 2 sprays into the nose daily.  16 g  0  . Fluticasone-Salmeterol (ADVAIR) 500-50 MCG/DOSE AEPB Inhale 1 puff into the lungs every 12 (twelve) hours.  60 each  2  . irbesartan (AVAPRO) 150 MG tablet Take 150 mg by mouth daily after breakfast.      . meloxicam (MOBIC) 15 MG tablet Take 1 tablet (15 mg total) by mouth daily.  15 tablet  2  . montelukast (SINGULAIR) 10 MG tablet Take 10 mg by mouth at bedtime.      . pantoprazole (PROTONIX) 20 MG tablet Take 20 mg by mouth daily.      . traMADol (ULTRAM) 50 MG tablet Take 50 mg by mouth every 6 (six) hours as needed for pain.       . traZODone (DESYREL) 50 MG tablet Take 50 mg by mouth at bedtime.      . [DISCONTINUED]  loratadine (CLARITIN) 10 MG tablet Take 10 mg by mouth daily.       No current facility-administered medications on file prior to visit.   History reviewed. No pertinent family history. History   Social History  . Marital Status: Single    Spouse Name: N/A    Number of Children: N/A  . Years of Education: N/A   Occupational History  . Not on file.   Social History Main Topics  . Smoking status: Never Smoker   . Smokeless tobacco: Not on file  . Alcohol Use: No  . Drug Use: No  . Sexually Active: Not on file   Other Topics Concern  . Not on file   Social History Narrative  . No narrative on file    Review of Systems  Constitutional: Negative for fever, chills, diaphoresis, activity change, appetite change and fatigue.  HENT: Negative for ear pain, nosebleeds, congestion, facial swelling, rhinorrhea, neck pain, neck stiffness and ear discharge.   Eyes: Negative for pain, discharge, redness, itching and visual disturbance.  Respiratory: Negative for cough, choking, chest tightness, shortness of breath, wheezing and stridor.   Cardiovascular: Negative for chest pain, palpitations and leg swelling.  Gastrointestinal: Negative  for abdominal distention.  Genitourinary: Negative for dysuria, urgency, frequency, hematuria, flank pain, decreased urine volume, difficulty urinating and dyspareunia.  Musculoskeletal: Negative for back pain, joint swelling, arthralgias and gait problem.  Neurological: Negative for dizziness, tremors, seizures, syncope, facial asymmetry, speech difficulty, weakness, light-headedness, numbness and headaches.  Hematological: Negative for adenopathy. Does not bruise/bleed easily.  Psychiatric/Behavioral: Negative for hallucinations, behavioral problems, confusion, dysphoric mood, decreased concentration and agitation.    Objective:   Filed Vitals:   03/03/13 1147  BP: 149/88  Pulse: 94  Temp: 98 F (36.7 C)  Resp: 18    Physical Exam   Constitutional: Appears well-developed and well-nourished. No distress.  HENT: Normocephalic. External right and left ear normal. Oropharynx is clear and moist.  Eyes: Conjunctivae and EOM are normal. PERRLA, no scleral icterus.  Neck: Normal ROM. Neck supple. No JVD. No tracheal deviation. No thyromegaly.  CVS: RRR, S1/S2 +, no murmurs, no gallops, no carotid bruit.  Pulmonary: Effort and breath sounds normal, no stridor, rhonchi, wheezes, rales.  Abdominal: Soft. BS +,  no distension, tenderness, rebound or guarding.  Musculoskeletal: left knee swelling with no tenderness to palpation, no erythema  Lymphadenopathy: No lymphadenopathy noted, cervical, inguinal. Neuro: Alert. Normal reflexes, muscle tone coordination. No cranial nerve deficit. Skin: Skin is warm and dry. No rash noted. Not diaphoretic. No erythema. No pallor.  Psychiatric: Normal mood and affect. Behavior, judgment, thought content normal.   Lab Results  Component Value Date   WBC 6.6 06/30/2009   HGB 12.8 06/30/2009   HCT 38.9 06/30/2009   MCV 99.0 06/30/2009   PLT 443* 06/30/2009   Lab Results  Component Value Date   CREATININE 0.89 02/20/2010   BUN 14 02/20/2010   NA 135 02/20/2010   K 4.4 02/20/2010   CL 99 02/20/2010   CO2 25 02/20/2010    No results found for this basename: HGBA1C   Lipid Panel     Component Value Date/Time   CHOL 184 02/20/2010 2258   TRIG 79 02/20/2010 2258   HDL 71 02/20/2010 2258   CHOLHDL 2.6 Ratio 02/20/2010 2258   VLDL 16 02/20/2010 2258   LDLCALC 97 02/20/2010 2258       Assessment and plan:   Patient Active Problem List   Diagnosis Date Noted  . Left knee effusion - referral to ortho or sports medicine provided as we can not perform in the clinic  03/02/2007

## 2013-03-03 NOTE — Progress Notes (Signed)
Pt here with c/o left knee fluid and pain x 1 yr.c/o intermit achy pain. Has been to [facility] ED but told she need to f/u with primary for referral. Taking tylenol for pain

## 2013-03-03 NOTE — Patient Instructions (Signed)

## 2013-03-17 ENCOUNTER — Ambulatory Visit: Payer: No Typology Code available for payment source | Attending: Family Medicine | Admitting: Family Medicine

## 2013-03-17 VITALS — BP 176/95 | HR 85 | Temp 97.8°F | Resp 16 | Wt 141.2 lb

## 2013-03-17 DIAGNOSIS — K219 Gastro-esophageal reflux disease without esophagitis: Secondary | ICD-10-CM

## 2013-03-17 DIAGNOSIS — E785 Hyperlipidemia, unspecified: Secondary | ICD-10-CM

## 2013-03-17 DIAGNOSIS — I10 Essential (primary) hypertension: Secondary | ICD-10-CM

## 2013-03-17 DIAGNOSIS — J329 Chronic sinusitis, unspecified: Secondary | ICD-10-CM

## 2013-03-17 DIAGNOSIS — J45909 Unspecified asthma, uncomplicated: Secondary | ICD-10-CM

## 2013-03-17 DIAGNOSIS — J019 Acute sinusitis, unspecified: Secondary | ICD-10-CM | POA: Insufficient documentation

## 2013-03-17 DIAGNOSIS — J309 Allergic rhinitis, unspecified: Secondary | ICD-10-CM

## 2013-03-17 MED ORDER — AMOXICILLIN-POT CLAVULANATE 875-125 MG PO TABS: 1.0000 | ORAL_TABLET | Freq: Two times a day (BID) | ORAL | Status: DC

## 2013-03-17 MED ORDER — PREDNISONE 10 MG PO TABS: ORAL_TABLET | ORAL | Status: DC

## 2013-03-17 NOTE — Progress Notes (Signed)
Patient has congestion,cant sleep thick yellow discharge from nose

## 2013-03-17 NOTE — Progress Notes (Signed)
Patient ID: Angel Hebert, female   DOB: December 09, 1955, 57 y.o.   MRN: [id number]  CC:  Sinus   HPI: Pt reports that she has had a problem for weeks with her sinuses being infected and reported that she has been having some sinus pain and drainage.  Pt says that she is having some sinus pressure.  Pt is having a yellow drainage from nasal cavity. She needs to have sinus surgery but cannot afford to have the surgery because of not having medical insurance.    No Known Allergies Past Medical History  Diagnosis Date  . Asthma   . High cholesterol   . Hypertension   . GERD (gastroesophageal reflux disease)    Current Outpatient Prescriptions on File Prior to Visit  Medication Sig Dispense Refill  . albuterol (PROVENTIL HFA;VENTOLIN HFA) 108 (90 BASE) MCG/ACT inhaler Inhale 1-2 puffs into the lungs every 6 (six) hours as needed for wheezing.  1 Inhaler  2  . atorvastatin (LIPITOR) 10 MG tablet Take 10 mg by mouth daily.      . cetirizine (ZYRTEC) 10 MG tablet Take 1 tablet (10 mg total) by mouth daily.  30 tablet  0  . fluticasone (FLONASE) 50 MCG/ACT nasal spray Place 2 sprays into the nose daily.  16 g  0  . Fluticasone-Salmeterol (ADVAIR) 500-50 MCG/DOSE AEPB Inhale 1 puff into the lungs every 12 (twelve) hours.  60 each  2  . irbesartan (AVAPRO) 150 MG tablet Take 150 mg by mouth daily after breakfast.      . meloxicam (MOBIC) 15 MG tablet Take 1 tablet (15 mg total) by mouth daily.  15 tablet  2  . montelukast (SINGULAIR) 10 MG tablet Take 10 mg by mouth at bedtime.      . pantoprazole (PROTONIX) 20 MG tablet Take 20 mg by mouth daily.      . traMADol (ULTRAM) 50 MG tablet Take 50 mg by mouth every 6 (six) hours as needed for pain.       . traZODone (DESYREL) 50 MG tablet Take 50 mg by mouth at bedtime.      . [DISCONTINUED] loratadine (CLARITIN) 10 MG tablet Take 10 mg by mouth daily.       No current facility-administered medications on file prior to visit.   History reviewed. No pertinent  family history. History   Social History  . Marital Status: Single    Spouse Name: N/A    Number of Children: N/A  . Years of Education: N/A   Occupational History  . Not on file.   Social History Main Topics  . Smoking status: Never Smoker   . Smokeless tobacco: Not on file  . Alcohol Use: No  . Drug Use: No  . Sexually Active: Not on file   Other Topics Concern  . Not on file   Social History Narrative  . No narrative on file    Review of Systems  Constitutional: Negative for fever, chills, diaphoresis, activity change, appetite change and fatigue.  HENT:As above for ear pain, nosebleeds, congestion, facial swelling, rhinorrhea,Negative for neck pain, neck stiffness and ear discharge.   Eyes: Negative for pain, discharge, redness, itching and visual disturbance.  Respiratory: Negative for cough, choking, chest tightness, shortness of breath, wheezing and stridor.   Cardiovascular: Negative for chest pain, palpitations and leg swelling.  Gastrointestinal: Negative for abdominal distention.  Genitourinary: Negative for dysuria, urgency, frequency, hematuria, flank pain, decreased urine volume, difficulty urinating and dyspareunia.  Musculoskeletal: Negative for back  pain, joint swelling, arthralgias and gait problem.  Neurological: Negative for dizziness, tremors, seizures, syncope, facial asymmetry, speech difficulty, weakness, light-headedness, numbness and headaches.  Hematological: Negative for adenopathy. Does not bruise/bleed easily.  Psychiatric/Behavioral: Negative for hallucinations, behavioral problems, confusion, dysphoric mood, decreased concentration and agitation.    Objective:   Filed Vitals:   03/17/13 1541  BP: 176/95  Pulse: 85  Temp: 97.8 F (36.6 C)  Resp: 16    Physical Exam  Constitutional: Appears well-developed and well-nourished. No distress.  HENT: Normocephalic. External right and left ear normal. Oropharynx is clear and moist. swollen  nasal turbinates with yellow pus seen in left nasal cavity  Eyes: Conjunctivae and EOM are normal. PERRLA, no scleral icterus.  Neck: Normal ROM. Neck supple. No JVD. No tracheal deviation. No thyromegaly.  CVS: RRR, S1/S2 +, no murmurs, no gallops, no carotid bruit.  Pulmonary: Effort and breath sounds normal, no stridor, rhonchi, wheezes, rales.  Abdominal: Soft. BS +,  no distension, tenderness, rebound or guarding.  Musculoskeletal: Normal range of motion. No edema and no tenderness.  Lymphadenopathy: No lymphadenopathy noted, cervical, inguinal. Neuro: Alert. Normal reflexes, muscle tone coordination. No cranial nerve deficit. Skin: Skin is warm and dry. No rash noted. Not diaphoretic. No erythema. No pallor.  Psychiatric: Normal mood and affect. Behavior, judgment, thought content normal.   Lab Results  Component Value Date   WBC 6.6 06/30/2009   HGB 12.8 06/30/2009   HCT 38.9 06/30/2009   MCV 99.0 06/30/2009   PLT 443* 06/30/2009   Lab Results  Component Value Date   CREATININE 0.89 02/20/2010   BUN 14 02/20/2010   NA 135 02/20/2010   K 4.4 02/20/2010   CL 99 02/20/2010   CO2 25 02/20/2010    No results found for this basename: HGBA1C   Lipid Panel     Component Value Date/Time   CHOL 184 02/20/2010 2258   TRIG 79 02/20/2010 2258   HDL 71 02/20/2010 2258   CHOLHDL 2.6 Ratio 02/20/2010 2258   VLDL 16 02/20/2010 2258   LDLCALC 97 02/20/2010 2258       Assessment and plan:   Patient Active Problem List   Diagnosis Date Noted  . HYPERLIPIDEMIA 03/02/2007  . HYPERTENSION 03/02/2007  . ALLERGIC RHINITIS 03/02/2007  . ASTHMA 03/02/2007  . GERD 03/02/2007   Acute Sinusitis  Pt has done well in the past with augmentin and prednisone.  Will rx for this and have her follow up if no improvement.    The patient was given clear instructions to go to ER or return to medical center if symptoms don't improve, worsen or new problems develop.  The patient verbalized understanding.   The patient was told to call to get any lab results if not heard anything in the next week.    Follow up as scheduled  Rodney Langton, MD, CDE, FAAFP [facility] [place], [place]

## 2013-03-17 NOTE — Patient Instructions (Addendum)
Allergic Rhinitis Allergic rhinitis is when the mucous membranes in the nose respond to allergens. Allergens are particles in the air that cause your body to have an allergic reaction. This causes you to release allergic antibodies. Through a chain of events, these eventually cause you to release histamine into the blood stream (hence the use of antihistamines). Although meant to be protective to the body, it is this release that causes your discomfort, such as frequent sneezing, congestion and an itchy runny nose.  CAUSES  The pollen allergens may come from grasses, trees, and weeds. This is seasonal allergic rhinitis, or "hay fever." Other allergens cause year-round allergic rhinitis (perennial allergic rhinitis) such as house dust mite allergen, pet dander and mold spores.  SYMPTOMS   Nasal stuffiness (congestion).  Runny, itchy nose with sneezing and tearing of the eyes.  There is often an itching of the mouth, eyes and ears. It cannot be cured, but it can be controlled with medications. DIAGNOSIS  If you are unable to determine the offending allergen, skin or blood testing may find it. TREATMENT   Avoid the allergen.  Medications and allergy shots (immunotherapy) can help.  Hay fever may often be treated with antihistamines in pill or nasal spray forms. Antihistamines block the effects of histamine. There are over-the-counter medicines that may help with nasal congestion and swelling around the eyes. Check with your caregiver before taking or giving this medicine. If the treatment above does not work, there are many new medications your caregiver can prescribe. Stronger medications may be used if initial measures are ineffective. Desensitizing injections can be used if medications and avoidance fails. Desensitization is when a patient is given ongoing shots until the body becomes less sensitive to the allergen. Make sure you follow up with your caregiver if problems continue. SEEK MEDICAL  CARE IF:   You develop fever (more than 100.5 F (38.1 C).  You develop a cough that does not stop easily (persistent).  You have shortness of breath.  You start wheezing.  Symptoms interfere with normal daily activities. Document Released: 05/14/2001 Document Revised: 11/11/2011 Document Reviewed: 11/23/2008 Exit[employer] Patient Information 2014 [employer]. Sinusitis Sinusitis is redness, soreness, and swelling (inflammation) of the paranasal sinuses. Paranasal sinuses are air pockets within the bones of your face (beneath the eyes, the middle of the forehead, or above the eyes). In healthy paranasal sinuses, mucus is able to drain out, and air is able to circulate through them by way of your nose. However, when your paranasal sinuses are inflamed, mucus and air can become trapped. This can allow bacteria and other germs to grow and cause infection. Sinusitis can develop quickly and last only a short time (acute) or continue over a long period (chronic). Sinusitis that lasts for more than 12 weeks is considered chronic.  CAUSES  Causes of sinusitis include:  Allergies.  Structural abnormalities, such as displacement of the cartilage that separates your nostrils (deviated septum), which can decrease the air flow through your nose and sinuses and affect sinus drainage.  Functional abnormalities, such as when the small hairs (cilia) that line your sinuses and help remove mucus do not work properly or are not present. SYMPTOMS  Symptoms of acute and chronic sinusitis are the same. The primary symptoms are pain and pressure around the affected sinuses. Other symptoms include:  Upper toothache.  Earache.  Headache.  Bad breath.  Decreased sense of smell and taste.  A cough, which worsens when you are lying flat.  Fatigue.    Fever.  Thick drainage from your nose, which often is green and may contain pus (purulent).  Swelling and warmth over the affected sinuses. DIAGNOSIS    Your caregiver will perform a physical exam. During the exam, your caregiver may:  Look in your nose for signs of abnormal growths in your nostrils (nasal polyps).  Tap over the affected sinus to check for signs of infection.  View the inside of your sinuses (endoscopy) with a special imaging device with a light attached (endoscope), which is inserted into your sinuses. If your caregiver suspects that you have chronic sinusitis, one or more of the following tests may be recommended:  Allergy tests.  Nasal culture A sample of mucus is taken from your nose and sent to a lab and screened for bacteria.  Nasal cytology A sample of mucus is taken from your nose and examined by your caregiver to determine if your sinusitis is related to an allergy. TREATMENT  Most cases of acute sinusitis are related to a viral infection and will resolve on their own within 10 days. Sometimes medicines are prescribed to help relieve symptoms (pain medicine, decongestants, nasal steroid sprays, or saline sprays).  However, for sinusitis related to a bacterial infection, your caregiver will prescribe antibiotic medicines. These are medicines that will help kill the bacteria causing the infection.  Rarely, sinusitis is caused by a fungal infection. In theses cases, your caregiver will prescribe antifungal medicine. For some cases of chronic sinusitis, surgery is needed. Generally, these are cases in which sinusitis recurs more than 3 times per year, despite other treatments. HOME CARE INSTRUCTIONS   Drink plenty of water. Water helps thin the mucus so your sinuses can drain more easily.  Use a humidifier.  Inhale steam 3 to 4 times a day (for example, sit in the bathroom with the shower running).  Apply a warm, moist washcloth to your face 3 to 4 times a day, or as directed by your caregiver.  Use saline nasal sprays to help moisten and clean your sinuses.  Take over-the-counter or prescription medicines for  pain, discomfort, or fever only as directed by your caregiver. SEEK IMMEDIATE MEDICAL CARE IF:  You have increasing pain or severe headaches.  You have nausea, vomiting, or drowsiness.  You have swelling around your face.  You have vision problems.  You have a stiff neck.  You have difficulty breathing. MAKE SURE YOU:   Understand these instructions.  Will watch your condition.  Will get help right away if you are not doing well or get worse. Document Released: 08/19/2005 Document Revised: 11/11/2011 Document Reviewed: 09/03/2011 [employer] Patient Information 2014 [employer].  

## 2013-03-31 ENCOUNTER — Ambulatory Visit (INDEPENDENT_AMBULATORY_CARE_PROVIDER_SITE_OTHER): Payer: No Typology Code available for payment source | Admitting: Sports Medicine

## 2013-03-31 VITALS — BP 149/77 | Ht 67.0 in | Wt 145.0 lb

## 2013-03-31 DIAGNOSIS — M1710 Unilateral primary osteoarthritis, unspecified knee: Secondary | ICD-10-CM

## 2013-03-31 DIAGNOSIS — M25569 Pain in unspecified knee: Secondary | ICD-10-CM

## 2013-03-31 DIAGNOSIS — M25562 Pain in left knee: Secondary | ICD-10-CM

## 2013-03-31 DIAGNOSIS — M1712 Unilateral primary osteoarthritis, left knee: Secondary | ICD-10-CM

## 2013-03-31 NOTE — Progress Notes (Signed)
  Subjective:    Patient ID: Angel Hebert, female    DOB: 24-Dec-1955, 57 y.o.   MRN: [id number]  HPI chief complaint: Left knee pain and swelling  Very pleasant 57 year old female comes in today at the request of her primary care physician for evaluation of her left knee. For the past several months she has noticed diffuse pain and swelling in the knee which is most noticeable after activity. No previous trauma. No prior knee surgery. No mechanical symptoms. Reason for referral was to consider aspiration and injection of this joint. She denies fevers or chills. She denies being given any specific medication for this condition. She has not had any previous injections into this joint. She is here today with her mother.  Past medical history and current medications are reviewed No known drug allergies     Review of Systems     Objective:   Physical Exam Well-developed, well-nourished. No acute distress. Awake alert and oriented x3. Vital signs are reviewed  Left knee: Range of motion 0-120. 1+ effusion. She is tender to palpation along the medial joint line with pain but no popping with McMurray's. No tenderness along the lateral joint line. Good ligamentous stability. Neurovascularly intact distally. Walking with a slight limp.  X-rays of the left knee dated 10/03/2012 are reviewed. They are non-standing x-rays and demonstrate moderate to severe medial compartmental narrowing consistent with DJD. There is also a moderate-sized suprapatellar joint effusion.       Assessment & Plan:  1. 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 Informed consent was obtained. Patient's left knee was cleaned and draped in a sterile fashion. Under sterile technique and utilizing a superiolateral approach,3 cc of 1% Xylocaine were used to anesthetize the skin. This was followed by aspiration of 30 cc of clear/yellow fluid  using a 30 cc syringe and an 18-gauge needle. Syringe was then exchanged for a syringe containing 1 cc of Depo-Medrol and 3 cc of Xylocaine. Post procedure the patient was comfortable. She tolerated without difficulty. Effusion had resolved. Patient likely has more advanced medial compartmental DJD than what is seen on her x-rays. Definitive treatment at some point we'll be a total knee arthroplasty. We will give her an ACE wrap to help with compression. Followup when necessary.

## 2013-04-05 ENCOUNTER — Ambulatory Visit: Payer: No Typology Code available for payment source

## 2013-05-31 ENCOUNTER — Other Ambulatory Visit (HOSPITAL_COMMUNITY): Payer: Self-pay | Admitting: Nurse Practitioner

## 2013-05-31 ENCOUNTER — Ambulatory Visit (HOSPITAL_COMMUNITY)
Admission: RE | Admit: 2013-05-31 | Discharge: 2013-05-31 | Disposition: A | Discharge status: Home / Self Care | Payer: No Typology Code available for payment source | Source: Ambulatory Visit | Attending: Nurse Practitioner | Admitting: Nurse Practitioner

## 2013-05-31 DIAGNOSIS — M25569 Pain in unspecified knee: Secondary | ICD-10-CM | POA: Insufficient documentation

## 2013-05-31 DIAGNOSIS — M1710 Unilateral primary osteoarthritis, unspecified knee: Secondary | ICD-10-CM | POA: Insufficient documentation

## 2013-05-31 DIAGNOSIS — R52 Pain, unspecified: Secondary | ICD-10-CM

## 2013-05-31 DIAGNOSIS — IMO0002 Reserved for concepts with insufficient information to code with codable children: Secondary | ICD-10-CM | POA: Insufficient documentation

## 2013-09-29 ENCOUNTER — Other Ambulatory Visit (HOSPITAL_COMMUNITY): Payer: Self-pay | Admitting: Internal Medicine

## 2013-09-29 DIAGNOSIS — Z1231 Encounter for screening mammogram for malignant neoplasm of breast: Secondary | ICD-10-CM

## 2013-10-04 ENCOUNTER — Ambulatory Visit (HOSPITAL_COMMUNITY): Payer: Self-pay

## 2013-10-18 ENCOUNTER — Ambulatory Visit (HOSPITAL_COMMUNITY)
Admission: RE | Admit: 2013-10-18 | Discharge: 2013-10-18 | Disposition: A | Discharge status: Home / Self Care | Payer: No Typology Code available for payment source | Source: Ambulatory Visit | Attending: Internal Medicine | Admitting: Internal Medicine

## 2013-10-18 DIAGNOSIS — Z1231 Encounter for screening mammogram for malignant neoplasm of breast: Secondary | ICD-10-CM | POA: Insufficient documentation

## 2013-10-19 ENCOUNTER — Ambulatory Visit: Payer: Self-pay | Admitting: Internal Medicine

## 2013-10-19 ENCOUNTER — Ambulatory Visit (HOSPITAL_COMMUNITY): Payer: No Typology Code available for payment source

## 2013-10-20 ENCOUNTER — Encounter (HOSPITAL_COMMUNITY): Payer: Self-pay | Admitting: Emergency Medicine

## 2013-10-20 ENCOUNTER — Emergency Department (HOSPITAL_COMMUNITY)
Admission: EM | Admit: 2013-10-20 | Discharge: 2013-10-20 | Disposition: A | Discharge status: Home / Self Care | Payer: No Typology Code available for payment source | Attending: Emergency Medicine | Admitting: Emergency Medicine

## 2013-10-20 ENCOUNTER — Emergency Department (HOSPITAL_COMMUNITY): Payer: No Typology Code available for payment source

## 2013-10-20 DIAGNOSIS — Z791 Long term (current) use of non-steroidal anti-inflammatories (NSAID): Secondary | ICD-10-CM | POA: Insufficient documentation

## 2013-10-20 DIAGNOSIS — J45909 Unspecified asthma, uncomplicated: Secondary | ICD-10-CM | POA: Insufficient documentation

## 2013-10-20 DIAGNOSIS — I10 Essential (primary) hypertension: Secondary | ICD-10-CM | POA: Insufficient documentation

## 2013-10-20 DIAGNOSIS — K219 Gastro-esophageal reflux disease without esophagitis: Secondary | ICD-10-CM | POA: Insufficient documentation

## 2013-10-20 DIAGNOSIS — Z79899 Other long term (current) drug therapy: Secondary | ICD-10-CM | POA: Insufficient documentation

## 2013-10-20 DIAGNOSIS — E7800 Pure hypercholesterolemia, unspecified: Secondary | ICD-10-CM | POA: Insufficient documentation

## 2013-10-20 DIAGNOSIS — G479 Sleep disorder, unspecified: Secondary | ICD-10-CM | POA: Insufficient documentation

## 2013-10-20 DIAGNOSIS — R928 Other abnormal and inconclusive findings on diagnostic imaging of breast: Secondary | ICD-10-CM

## 2013-10-20 DIAGNOSIS — IMO0002 Reserved for concepts with insufficient information to code with codable children: Secondary | ICD-10-CM | POA: Insufficient documentation

## 2013-10-20 DIAGNOSIS — J329 Chronic sinusitis, unspecified: Secondary | ICD-10-CM | POA: Insufficient documentation

## 2013-10-20 MED ORDER — AMOXICILLIN-POT CLAVULANATE 875-125 MG PO TABS: 1.0000 | ORAL_TABLET | Freq: Two times a day (BID) | ORAL | Status: DC

## 2013-10-20 MED ORDER — DEXAMETHASONE SODIUM PHOSPHATE 10 MG/ML IJ SOLN
10.0000 mg | Freq: Once | INTRAMUSCULAR | Status: AC
  Administered 2013-10-20: 10 mg via INTRAMUSCULAR
  Filled 2013-10-20: qty 1

## 2013-10-20 MED ORDER — HYDROCODONE-HOMATROPINE 5-1.5 MG/5ML PO SYRP: 5.0000 mL | ORAL_SOLUTION | Freq: Four times a day (QID) | ORAL | Status: DC | PRN

## 2013-10-20 NOTE — ED Notes (Signed)
Pt reports non-productive cough x 2 weeks with nasal congestion with sinus pressure.

## 2013-10-20 NOTE — ED Provider Notes (Signed)
CSN: [id number]     Arrival date & time 10/20/13  1338 History  This chart was scribed for non-physician practitioner, Margarita Mail, PA-C working with Arbie Cookey,  by Einar Pheasant, ED scribe. This patient was seen in room TR09C/TR09C and the patient's care was started at 4:51 PM.    Chief Complaint  Patient presents with  . Nasal Congestion  . Cough   The history is provided by the patient. No language interpreter was used.   HPI Comments: Angel Hebert is a 58 y.o. female who presents to the Emergency Department complaining of nasal congestion that started 2 weeks ago. Pt states that she frequently gets sinus infections, so she believes this episode may be a sinus infection. Pt is also complaining of associated productive cough with yellow sputum, and sleep disturbances secondary to her cough. She reports taking prednisone and Augmentin. She is requesting a steroid injection. Denies any fever, rash, and diaphoresis.   Past Medical History  Diagnosis Date  . Asthma   . High cholesterol   . Hypertension   . GERD (gastroesophageal reflux disease)    Past Surgical History  Procedure Laterality Date  . Abdominal hysterectomy     No family history on file. History  Substance Use Topics  . Smoking status: Never Smoker   . Smokeless tobacco: Not on file  . Alcohol Use: No   OB History   Grav Para Term Preterm Abortions TAB SAB Ect Mult Living                 Review of Systems  HENT: Positive for congestion.   Respiratory: Positive for cough.       Allergies  Review of patient's allergies indicates no known allergies.  Home Medications   Current Outpatient Rx  Name  Route  Sig  Dispense  Refill  . albuterol (PROVENTIL HFA;VENTOLIN HFA) 108 (90 BASE) MCG/ACT inhaler   Inhalation   Inhale 1-2 puffs into the lungs every 6 (six) hours as needed for wheezing.   1 Inhaler   2   . atorvastatin (LIPITOR) 10 MG tablet   Oral   Take 10 mg by mouth daily.          . cetirizine (ZYRTEC) 10 MG tablet   Oral   Take 1 tablet (10 mg total) by mouth daily.   30 tablet   0   . fluticasone (FLONASE) 50 MCG/ACT nasal spray   Nasal   Place 2 sprays into the nose daily.   16 g   0   . Fluticasone-Salmeterol (ADVAIR) 500-50 MCG/DOSE AEPB   Inhalation   Inhale 1 puff into the lungs every 12 (twelve) hours.   60 each   2   . hydrochlorothiazide (HYDRODIURIL) 25 MG tablet   Oral   Take 12.5 mg by mouth daily.         . irbesartan (AVAPRO) 150 MG tablet   Oral   Take 150 mg by mouth daily after breakfast.         . meloxicam (MOBIC) 15 MG tablet   Oral   Take 1 tablet (15 mg total) by mouth daily.   15 tablet   2   . montelukast (SINGULAIR) 10 MG tablet   Oral   Take 10 mg by mouth at bedtime.         . pantoprazole (PROTONIX) 20 MG tablet   Oral   Take 20 mg by mouth daily.         Marland Kitchen  traZODone (DESYREL) 50 MG tablet   Oral   Take 50 mg by mouth at bedtime.          Triage Vitals: BP 146/80  Pulse 89  Temp(Src) 97.5 F (36.4 C) (Oral)  Resp 18  SpO2 98% Physical Exam  Nursing note and vitals reviewed. Constitutional: She is oriented to person, place, and time. She appears well-developed and well-nourished. No distress.  HENT:  Head: Normocephalic and atraumatic.  Nose: Right sinus exhibits maxillary sinus tenderness and frontal sinus tenderness. Left sinus exhibits maxillary sinus tenderness and frontal sinus tenderness.  Mouth/Throat: No oropharyngeal exudate or posterior oropharyngeal edema.  Eyes: EOM are normal.  Neck: Neck supple. No tracheal deviation present.  Cardiovascular: Normal rate, regular rhythm and normal heart sounds.   No murmur heard. Pulmonary/Chest: Effort normal and breath sounds normal. No respiratory distress. She has no wheezes. She has no rales.  Musculoskeletal: Normal range of motion.  Neurological: She is alert and oriented to person, place, and time.  Skin: Skin is warm and dry.   Psychiatric: She has a normal mood and affect. Her behavior is normal.    ED Course  Procedures (including critical care time)  DIAGNOSTIC STUDIES: Oxygen Saturation is 98% on RA, normal by my interpretation.    COORDINATION OF CARE: 4:54 PM- Will order a steroid injection for pt. Pt advised of plan for treatment and pt agrees.  Labs Review Labs Reviewed - No data to display Imaging Review Dg Chest 2 View  10/20/2013   CLINICAL DATA:  Cough and shortness of breath  EXAM: CHEST  2 VIEW  COMPARISON:  Jan 25, 2010  FINDINGS: The lungs are clear. Heart size and pulmonary vascularity are normal. No adenopathy. No bone lesions. .  IMPRESSION: No abnormality noted.   Electronically Signed   By: Lowella Grip M.D.   On: 10/20/2013 14:07    EKG Interpretation   None       MDM   Final diagnoses:  Sinusitis   Patient complaining of symptoms of sinusitis.    Severe symptoms have been present for greater than 10 days with purulent nasal discharge and maxillary sinus pain.  Concern for acute bacterial rhinosinusitis.  Patient discharged with Augmentin.  Instructions given for warm saline nasal wash and recommendations for follow-up with primary care physician.    I personally performed the services described in this documentation, which was scribed in my presence. The recorded information has been reviewed and is accurate.    Margarita Mail, PA-C 10/25/13 2021

## 2013-10-20 NOTE — Discharge Instructions (Signed)

## 2013-10-21 ENCOUNTER — Other Ambulatory Visit: Payer: Self-pay | Admitting: Internal Medicine

## 2013-10-21 DIAGNOSIS — R928 Other abnormal and inconclusive findings on diagnostic imaging of breast: Secondary | ICD-10-CM

## 2013-10-26 NOTE — ED Provider Notes (Signed)
Medical screening examination/treatment/procedure(s) were performed by non-physician practitioner and as supervising physician I was immediately available for consultation/collaboration.  EKG Interpretation   None         Rahil Passey David Jesse Nosbisch III, MD 10/26/13 0720 

## 2013-11-16 ENCOUNTER — Ambulatory Visit (HOSPITAL_COMMUNITY): Payer: No Typology Code available for payment source

## 2013-11-16 ENCOUNTER — Other Ambulatory Visit: Payer: Self-pay | Admitting: Obstetrics and Gynecology

## 2013-11-16 DIAGNOSIS — R928 Other abnormal and inconclusive findings on diagnostic imaging of breast: Secondary | ICD-10-CM

## 2013-11-22 ENCOUNTER — Inpatient Hospital Stay: Admission: RE | Admit: 2013-11-22 | Payer: No Typology Code available for payment source | Source: Ambulatory Visit

## 2013-12-22 ENCOUNTER — Ambulatory Visit: Payer: No Typology Code available for payment source | Admitting: Internal Medicine

## 2014-10-03 ENCOUNTER — Encounter: Payer: Self-pay | Admitting: Physician Assistant

## 2014-10-03 ENCOUNTER — Ambulatory Visit: Payer: Self-pay | Attending: Internal Medicine | Admitting: Physician Assistant

## 2014-10-03 VITALS — BP 142/83 | HR 104 | Temp 98.4°F | Resp 18 | Ht 67.0 in | Wt 123.0 lb

## 2014-10-03 DIAGNOSIS — J0110 Acute frontal sinusitis, unspecified: Secondary | ICD-10-CM

## 2014-10-03 DIAGNOSIS — H6123 Impacted cerumen, bilateral: Secondary | ICD-10-CM | POA: Insufficient documentation

## 2014-10-03 DIAGNOSIS — Z791 Long term (current) use of non-steroidal anti-inflammatories (NSAID): Secondary | ICD-10-CM | POA: Insufficient documentation

## 2014-10-03 DIAGNOSIS — L539 Erythematous condition, unspecified: Secondary | ICD-10-CM | POA: Insufficient documentation

## 2014-10-03 DIAGNOSIS — J019 Acute sinusitis, unspecified: Secondary | ICD-10-CM | POA: Insufficient documentation

## 2014-10-03 DIAGNOSIS — Z7951 Long term (current) use of inhaled steroids: Secondary | ICD-10-CM | POA: Insufficient documentation

## 2014-10-03 DIAGNOSIS — Z79899 Other long term (current) drug therapy: Secondary | ICD-10-CM | POA: Insufficient documentation

## 2014-10-03 MED ORDER — PREDNISONE 10 MG PO TABS: 10.0000 mg | ORAL_TABLET | Freq: Every day | ORAL | Status: DC

## 2014-10-03 MED ORDER — AMOXICILLIN-POT CLAVULANATE 875-125 MG PO TABS: 1.0000 | ORAL_TABLET | Freq: Two times a day (BID) | ORAL | Status: DC

## 2014-10-03 NOTE — Progress Notes (Signed)
Pt comes in with c/o sinus infection  Cough,nasal congestion,post nasal drip x 2 weeks Denies fever,chills,n,v VSS-

## 2014-10-03 NOTE — Progress Notes (Signed)
Chief Complaint: " I think I have a sinus infection"  Subjective:  59 year old female presents with 2 weeks of drainage from the nose. Is elbow in color. His peak. Shehas some associated shortness of breath. She has some associated dfficulty sleeping. She's ben congested in her chest and in her face. She is sore in the front of her face. She denies chest pain. She denies sore throat. She denies earache.  ROS:  GEN: denies fever or chills, denies change in weight Skin: denies lesions or rashes HEENT: denies headache, earache, epistaxis, sore throat, or neck pain LUNGS: denies SHOB, dyspnea, PND, orthopnea CV: denies CP or palpitations ABD: denies abd pain, N or V EXT: denies muscle spasms or swelling; no pain in lower ext, no weakness NEURO: denies numbness or tingling, denies sz, stroke or TIA   Objective:  Filed Vitals:   10/03/14 1618  BP: 142/83  Pulse: 104  Temp: 98.4 F (36.9 C)  TempSrc: Oral  Resp: 18  Height: 5\' 7"  (1.702 m)  Weight: 123 lb (55.792 kg)  SpO2: 98%    Physical Exam:  General: in no acute distress. HEENT:   ears-Full of wax. Minimally erythematous. No fluid. Neck supple.  Throat without exudate. Tender to palpation of the frontal sinus area. Heart: Normal  s1 &s2  Regular rate and rhythm, without murmurs, rubs, gallops. Lungs: Clear to auscultation bilaterally.  Pertinent Lab Results:none   Medications: Prior to Admission medications   Medication Sig Start Date End Date Taking? Authorizing Provider  albuterol (PROVENTIL HFA;VENTOLIN HFA) 108 (90 BASE) MCG/ACT inhaler Inhale 1-2 puffs into the lungs every 6 (six) hours as needed for wheezing. 10/03/12  Yes Harden Mo, MD  atorvastatin (LIPITOR) 10 MG tablet Take 10 mg by mouth daily.   Yes Historical Provider, MD  cetirizine (ZYRTEC) 10 MG tablet Take 1 tablet (10 mg total) by mouth daily. 12/09/12  Yes Adlih Moreno-Coll, MD  fluticasone (FLONASE) 50 MCG/ACT nasal spray Place 2 sprays into the  nose daily. 12/09/12  Yes Adlih Moreno-Coll, MD  Fluticasone-Salmeterol (ADVAIR) 500-50 MCG/DOSE AEPB Inhale 1 puff into the lungs every 12 (twelve) hours. 10/03/12  Yes Harden Mo, MD  hydrochlorothiazide (HYDRODIURIL) 25 MG tablet Take 12.5 mg by mouth daily.   Yes Historical Provider, MD  irbesartan (AVAPRO) 150 MG tablet Take 150 mg by mouth daily after breakfast.   Yes Historical Provider, MD  meloxicam (MOBIC) 15 MG tablet Take 1 tablet (15 mg total) by mouth daily. 10/03/12  Yes Harden Mo, MD  montelukast (SINGULAIR) 10 MG tablet Take 10 mg by mouth at bedtime.   Yes Historical Provider, MD  pantoprazole (PROTONIX) 20 MG tablet Take 20 mg by mouth daily.   Yes Historical Provider, MD  traZODone (DESYREL) 50 MG tablet Take 50 mg by mouth at bedtime.   Yes Historical Provider, MD  amoxicillin-clavulanate (AUGMENTIN) 875-125 MG per tablet Take 1 tablet by mouth every 12 (twelve) hours. 10/03/14   Nanci Lakatos Daneil Dan, PA-C  HYDROcodone-homatropine (HYCODAN) 5-1.5 MG/5ML syrup Take 5 mLs by mouth every 6 (six) hours as needed for cough. Patient not taking: Reported on 10/03/2014 10/20/13   Margarita Mail, PA-C  predniSONE (DELTASONE) 10 MG tablet Take 1 tablet (10 mg total) by mouth daily with breakfast. 10/03/14   Brayton Caves, PA-C    Assessment: 1.   Acute sinusitis  Plan:  Augmentin  Prednisone  Follow up:as needed  The patient was given clear instructions to go to ER or return to  medical center if symptoms don't improve, worsen or new problems develop. The patient verbalized understanding. The patient was told to call to get lab results if they haven't heard anything in the next week.   This note has been created with Surveyor, quantity. Any transcriptional errors are unintentional.   Zettie Pho, PA-C 10/03/2014, 4:28 PM

## 2014-10-28 ENCOUNTER — Ambulatory Visit: Payer: Self-pay | Attending: Internal Medicine

## 2014-12-02 ENCOUNTER — Ambulatory Visit: Payer: Self-pay | Attending: Family Medicine | Admitting: Family Medicine

## 2014-12-02 VITALS — BP 164/85 | HR 86 | Temp 97.5°F | Resp 16

## 2014-12-02 DIAGNOSIS — J0100 Acute maxillary sinusitis, unspecified: Secondary | ICD-10-CM

## 2014-12-02 DIAGNOSIS — J019 Acute sinusitis, unspecified: Secondary | ICD-10-CM | POA: Insufficient documentation

## 2014-12-02 DIAGNOSIS — J329 Chronic sinusitis, unspecified: Secondary | ICD-10-CM | POA: Insufficient documentation

## 2014-12-02 DIAGNOSIS — I10 Essential (primary) hypertension: Secondary | ICD-10-CM | POA: Insufficient documentation

## 2014-12-02 MED ORDER — PREDNISONE 10 MG PO TABS: 10.0000 mg | ORAL_TABLET | Freq: Every day | ORAL | Status: DC

## 2014-12-02 MED ORDER — SULFAMETHOXAZOLE-TRIMETHOPRIM 800-160 MG PO TABS: 1.0000 | ORAL_TABLET | Freq: Two times a day (BID) | ORAL | Status: DC

## 2014-12-02 NOTE — Progress Notes (Signed)
Pt comes in as per walk in with c/o Sinusitis flare up x 3 week C/o frequent cough, post nasal drip with yellow nasal drainage Pt has Hx.Polyps but unable to afford ENT fees Denies fever,chills,n,v Pt is using Albuterol inhaler, Singulair and Flonase without relief

## 2014-12-02 NOTE — Patient Instructions (Signed)
Take medications as prescribed Lots of liquids Follow-up as needed.

## 2014-12-02 NOTE — Progress Notes (Signed)
Patient ID: Angel Hebert, female   DOB: [birth year], 59 y.o.   MRN: [id number] CC:  "Sinusitis"  Subjective:  Patient with a history of nasal polyps and chronic sinus congestion, presents with a 3 week history of increased congestion, facial tenderness and some discolored drainage. She is treated for sinisitis 2-3 times a year. She denies, fever, chills, cough.  ROS:  See subjective.  Objective:  There is obvious nasal congestion, nasal passages are puffy. There is facial tenderness over the maxillary sinuses bilaterally. There is irritation on posterior pharynx wall.  Neck is supple FROM without adenopathy or tenderness. Lungs are clear. HS are regular.  Assessment:  Sinusitis Hypertension  Plan:  1.. Septra DS bid for 10 days 2.  Prednisone 10 mg one a day for 5 days. 3.  Follow-up as needed. 4.  Take BP regularly   Micheline Chapman, FNP [facility]

## 2015-01-07 ENCOUNTER — Other Ambulatory Visit: Payer: Self-pay | Admitting: Family Medicine

## 2015-01-07 ENCOUNTER — Encounter (HOSPITAL_COMMUNITY): Payer: Self-pay

## 2015-01-07 ENCOUNTER — Emergency Department (INDEPENDENT_AMBULATORY_CARE_PROVIDER_SITE_OTHER)
Admission: EM | Admit: 2015-01-07 | Discharge: 2015-01-07 | Disposition: A | Discharge status: Home / Self Care | Payer: No Typology Code available for payment source | Source: Home / Self Care | Attending: Family Medicine | Admitting: Family Medicine

## 2015-01-07 DIAGNOSIS — R05 Cough: Secondary | ICD-10-CM

## 2015-01-07 DIAGNOSIS — J0101 Acute recurrent maxillary sinusitis: Secondary | ICD-10-CM

## 2015-01-07 DIAGNOSIS — R059 Cough, unspecified: Secondary | ICD-10-CM

## 2015-01-07 MED ORDER — PREDNISONE 20 MG PO TABS: ORAL_TABLET | ORAL | Status: DC

## 2015-01-07 MED ORDER — HYDROCODONE-HOMATROPINE 5-1.5 MG/5ML PO SYRP: 5.0000 mL | ORAL_SOLUTION | Freq: Four times a day (QID) | ORAL | Status: DC | PRN

## 2015-01-07 MED ORDER — AMOXICILLIN-POT CLAVULANATE 875-125 MG PO TABS: 1.0000 | ORAL_TABLET | Freq: Two times a day (BID) | ORAL | Status: DC

## 2015-01-07 NOTE — ED Notes (Signed)
C/o pain , pressure in face w green and yellow nasal secretions . NAD. "I think I have another sinus infection"

## 2015-01-07 NOTE — ED Notes (Signed)
augmentin called in to [facility] , [place], at patient request

## 2015-01-07 NOTE — ED Provider Notes (Signed)
CSN: [id number]     Arrival date & time 01/07/15  1106 History   First MD Initiated Contact with Patient 01/07/15 1128     Chief Complaint  Patient presents with  . Facial Pain    The history is provided by the patient.  This 59 yo woman has two weeks of severe nasal congestion without fever but associated with cough.  She has been a smoker and notes some wheezing.  No hemoptysis or epistaxis.  No ear pain  Past Medical History  Diagnosis Date  . Asthma   . High cholesterol   . Hypertension   . GERD (gastroesophageal reflux disease)    Past Surgical History  Procedure Laterality Date  . Abdominal hysterectomy     History reviewed. No pertinent family history. History  Substance Use Topics  . Smoking status: Never Smoker   . Smokeless tobacco: Not on file  . Alcohol Use: No   OB History    No data available     Review of Systems  Constitutional: Positive for fatigue. Negative for fever, chills, diaphoresis and unexpected weight change.  HENT: Positive for congestion, dental problem, ear pain, hearing loss, postnasal drip, sinus pressure and sneezing. Negative for ear discharge, mouth sores, nosebleeds, rhinorrhea, trouble swallowing and voice change.   Eyes: Negative.   Respiratory: Positive for cough, chest tightness and wheezing.   Cardiovascular: Negative for chest pain, palpitations and leg swelling.  Gastrointestinal: Negative.   Genitourinary: Negative.   Musculoskeletal: Positive for myalgias.  Skin: Negative.   Neurological: Negative.   Psychiatric/Behavioral: Negative.     Allergies  Review of patient's allergies indicates no known allergies.  Home Medications   Prior to Admission medications   Medication Sig Start Date End Date Taking? Authorizing Provider  albuterol (PROVENTIL HFA;VENTOLIN HFA) 108 (90 BASE) MCG/ACT inhaler Inhale 1-2 puffs into the lungs every 6 (six) hours as needed for wheezing. 10/03/12   Harden Mo, MD  amoxicillin-clavulanate  (AUGMENTIN) 875-125 MG per tablet Take 1 tablet by mouth every 12 (twelve) hours. 10/03/14   Brayton Caves, PA-C  atorvastatin (LIPITOR) 10 MG tablet Take 10 mg by mouth daily.    Historical Provider, MD  cetirizine (ZYRTEC) 10 MG tablet Take 1 tablet (10 mg total) by mouth daily. 12/09/12   Adlih Moreno-Coll, MD  fluticasone (FLONASE) 50 MCG/ACT nasal spray Place 2 sprays into the nose daily. 12/09/12   Adlih Moreno-Coll, MD  Fluticasone-Salmeterol (ADVAIR) 500-50 MCG/DOSE AEPB Inhale 1 puff into the lungs every 12 (twelve) hours. 10/03/12   Harden Mo, MD  hydrochlorothiazide (HYDRODIURIL) 25 MG tablet Take 12.5 mg by mouth daily.    Historical Provider, MD  HYDROcodone-homatropine (HYCODAN) 5-1.5 MG/5ML syrup Take 5 mLs by mouth every 6 (six) hours as needed for cough. Patient not taking: Reported on 10/03/2014 10/20/13   Margarita Mail, PA-C  irbesartan (AVAPRO) 150 MG tablet Take 150 mg by mouth daily after breakfast.    Historical Provider, MD  meloxicam (MOBIC) 15 MG tablet Take 1 tablet (15 mg total) by mouth daily. 10/03/12   Harden Mo, MD  montelukast (SINGULAIR) 10 MG tablet Take 10 mg by mouth at bedtime.    Historical Provider, MD  pantoprazole (PROTONIX) 20 MG tablet Take 20 mg by mouth daily.    Historical Provider, MD  predniSONE (DELTASONE) 10 MG tablet Take 1 tablet (10 mg total) by mouth daily with breakfast. 12/02/14   Micheline Chapman, NP  sulfamethoxazole-trimethoprim (BACTRIM DS,SEPTRA DS) 800-160 MG per  tablet Take 1 tablet by mouth 2 (two) times daily. 12/02/14   Micheline Chapman, NP  traZODone (DESYREL) 50 MG tablet Take 50 mg by mouth at bedtime.    Historical Provider, MD   BP 110/57 mmHg  Pulse 105  Temp(Src) 97.6 F (36.4 C) (Oral)  Resp 14  SpO2 95% Physical Exam  Constitutional: She is oriented to person, place, and time. She appears well-developed and well-nourished.  HENT:  Head: Normocephalic and atraumatic.  Right Ear: External ear normal.  Left Ear: External  ear normal.  Poor dentition Marked nasal swelling  Eyes: Conjunctivae and EOM are normal. Pupils are equal, round, and reactive to light.  Neck: Normal range of motion. Neck supple.  Cardiovascular: Normal rate, regular rhythm, normal heart sounds and intact distal pulses.   Pulmonary/Chest: Effort normal. She has wheezes.  Right sided deep ronchi and wheezes  Abdominal: Soft.  Musculoskeletal: Normal range of motion. She exhibits no edema.  Neurological: She is alert and oriented to person, place, and time.  Skin: Skin is warm and dry.  Psychiatric: She has a normal mood and affect. Her behavior is normal.    ED Course  Procedures (including critical care time) Labs Review Labs Reviewed - No data to display  Imaging Review No results found.   MDM  Acute recurrent maxillary sinusitis     Robyn Haber, MD 01/07/15 1139

## 2015-01-07 NOTE — ED Notes (Signed)
[facility] [address], spoke w pharmacy staff (sent to [employer])

## 2015-01-07 NOTE — Discharge Instructions (Signed)

## 2015-03-28 ENCOUNTER — Ambulatory Visit: Payer: No Typology Code available for payment source | Admitting: Internal Medicine

## 2015-04-03 ENCOUNTER — Ambulatory Visit: Payer: No Typology Code available for payment source | Attending: Internal Medicine

## 2015-04-03 ENCOUNTER — Ambulatory Visit: Payer: Self-pay

## 2015-04-05 ENCOUNTER — Ambulatory Visit: Payer: No Typology Code available for payment source | Attending: Internal Medicine | Admitting: Internal Medicine

## 2015-04-05 ENCOUNTER — Encounter: Payer: Self-pay | Admitting: Internal Medicine

## 2015-04-05 VITALS — BP 159/95 | HR 77 | Temp 97.3°F | Resp 16 | Ht 67.0 in | Wt 129.2 lb

## 2015-04-05 DIAGNOSIS — J0111 Acute recurrent frontal sinusitis: Secondary | ICD-10-CM

## 2015-04-05 DIAGNOSIS — D1720 Benign lipomatous neoplasm of skin and subcutaneous tissue of unspecified limb: Secondary | ICD-10-CM

## 2015-04-05 DIAGNOSIS — R030 Elevated blood-pressure reading, without diagnosis of hypertension: Secondary | ICD-10-CM

## 2015-04-05 DIAGNOSIS — J339 Nasal polyp, unspecified: Secondary | ICD-10-CM

## 2015-04-05 DIAGNOSIS — IMO0001 Reserved for inherently not codable concepts without codable children: Secondary | ICD-10-CM

## 2015-04-05 MED ORDER — PREDNISONE 10 MG PO TABS: 10.0000 mg | ORAL_TABLET | Freq: Every day | ORAL | Status: DC

## 2015-04-05 MED ORDER — AMOXICILLIN-POT CLAVULANATE 875-125 MG PO TABS: 1.0000 | ORAL_TABLET | Freq: Two times a day (BID) | ORAL | Status: DC

## 2015-04-05 NOTE — Progress Notes (Signed)
Patient ID: Angel Hebert, female   DOB: 1955-09-20, 59 y.o.   MRN: [id number]  CC: Sinus Congestion and pain   HPI: Angel Hebert is a 59 y.o. female here today for a follow up visit.  Patient has past medical history of HTN, HLD, asthma, and GERD. Patient reports that she has been having sinus congestion for 2 weeks. She is having headache, cough, and rhinitis with yellow/green discharge. She denies fever, chills, sore throat, otalgia. She reports that she gets frequent sinus infections due to nasal polyps. She states that she seen the ENT provider last year who told her she had to pay before having polyp removal. She is also concerned about a "knot" under her left arm that is not painful. She states that she has had the "knot" for a couple of years and she would like removal if possible.   Patient has No chest pain, No abdominal pain - No Nausea, No new weakness tingling or numbness, No Cough - SOB.  Past Medical History  Diagnosis Date  . Asthma   . High cholesterol   . Hypertension   . GERD (gastroesophageal reflux disease)    Current Outpatient Prescriptions on File Prior to Visit  Medication Sig Dispense Refill  . albuterol (PROVENTIL HFA;VENTOLIN HFA) 108 (90 BASE) MCG/ACT inhaler Inhale 1-2 puffs into the lungs every 6 (six) hours as needed for wheezing. 1 Inhaler 2  . amoxicillin-clavulanate (AUGMENTIN) 875-125 MG per tablet Take 1 tablet by mouth every 12 (twelve) hours. 20 tablet 0  . atorvastatin (LIPITOR) 10 MG tablet Take 10 mg by mouth daily.    . cetirizine (ZYRTEC) 10 MG tablet Take 1 tablet (10 mg total) by mouth daily. 30 tablet 0  . fluticasone (FLONASE) 50 MCG/ACT nasal spray Place 2 sprays into the nose daily. 16 g 0  . Fluticasone-Salmeterol (ADVAIR) 500-50 MCG/DOSE AEPB Inhale 1 puff into the lungs every 12 (twelve) hours. 60 each 2  . hydrochlorothiazide (HYDRODIURIL) 25 MG tablet Take 12.5 mg by mouth daily.    Marland Kitchen HYDROcodone-homatropine (HYCODAN) 5-1.5 MG/5ML syrup Take 5  mLs by mouth every 6 (six) hours as needed for cough. 30 mL 0  . HYDROcodone-homatropine (HYDROMET) 5-1.5 MG/5ML syrup Take 5 mLs by mouth every 6 (six) hours as needed for cough. 120 mL 0  . irbesartan (AVAPRO) 150 MG tablet Take 150 mg by mouth daily after breakfast.    . meloxicam (MOBIC) 15 MG tablet Take 1 tablet (15 mg total) by mouth daily. 15 tablet 2  . montelukast (SINGULAIR) 10 MG tablet Take 10 mg by mouth at bedtime.    . pantoprazole (PROTONIX) 20 MG tablet Take 20 mg by mouth daily.    . predniSONE (DELTASONE) 20 MG tablet 3-3-3-2-2-2-1-1-1-1-1 daily with food 20 tablet 0  . traZODone (DESYREL) 50 MG tablet Take 50 mg by mouth at bedtime.    . [DISCONTINUED] loratadine (CLARITIN) 10 MG tablet Take 10 mg by mouth daily.     No current facility-administered medications on file prior to visit.   No family history on file. History   Social History  . Marital Status: Single    Spouse Name: N/A  . Number of Children: N/A  . Years of Education: N/A   Occupational History  . Not on file.   Social History Main Topics  . Smoking status: Never Smoker   . Smokeless tobacco: Not on file  . Alcohol Use: No  . Drug Use: No  . Sexual Activity: Not on file  Other Topics Concern  . Not on file   Social History Narrative    Review of Systems: Other than what is stated in HPI, all other systems are negative.  .    Objective:   Filed Vitals:   04/05/15 0949  BP: 159/95  Pulse: 77  Temp: 97.3 F (36.3 C)  Resp: 16    Physical Exam  HENT:  Right Ear: External ear normal.  Left Ear: External ear normal.  Mouth/Throat: Oropharynx is clear and moist.  Left nare-nasal polyp   Eyes: Conjunctivae are normal. Right eye exhibits no discharge. Left eye exhibits no discharge.  Cardiovascular: Normal rate, regular rhythm and normal heart sounds.   Pulmonary/Chest: Effort normal and breath sounds normal.    Lymphadenopathy:    She has no cervical adenopathy.     Lab  Results  Component Value Date   WBC 6.6 06/30/2009   HGB 12.8 06/30/2009   HCT 38.9 06/30/2009   MCV 99.0 06/30/2009   PLT 443* 06/30/2009   Lab Results  Component Value Date   CREATININE 0.89 02/20/2010   BUN 14 02/20/2010   NA 135 02/20/2010   K 4.4 02/20/2010   CL 99 02/20/2010   CO2 25 02/20/2010    No results found for: HGBA1C Lipid Panel     Component Value Date/Time   CHOL 184 02/20/2010 2258   TRIG 79 02/20/2010 2258   HDL 71 02/20/2010 2258   CHOLHDL 2.6 Ratio 02/20/2010 2258   VLDL 16 02/20/2010 2258   LDLCALC 97 02/20/2010 2258       Assessment and plan:  Angel Hebert was seen today for sinusitis.  Diagnoses and all orders for this visit:  Acute recurrent frontal sinusitis  Review of charts show that patient has been treated for recurrent sinusitis 3 times since February. I have advised patient to seek insurance so that she can get polyp removed to prevent frequent infections. Will give Augmentin and Prednisone for now. Encouraged daily Flonase use.  Nasal polyp Comments: left nare Will need removal   Lipoma of axilla Patient is uninsured currently. I will send surgery referral whenever patient is ready. I have reassured patient that lipomas are not cancerous.  Elevated BP Patient has history of HTN but reports that she does not take her prescribed medication as she should. Advised medication compliance and DASH diet.  Return in about 3 months (around 07/06/2015) for Hypertension.        Lance Bosch, [facility] [phone] 04/05/2015, 10:02 AM

## 2015-04-05 NOTE — Progress Notes (Signed)
Here to reestablish care Pt admits to not taking bp meds daily Patient is congested and having sinus pain

## 2015-04-05 NOTE — Patient Instructions (Addendum)
Please take you BP medication daily  Use Flonase daily to help with symptoms   Lipoma A lipoma is a noncancerous (benign) tumor composed of fat cells. They are usually found under the skin (subcutaneous). A lipoma may occur in any tissue of the body that contains fat. Common areas for lipomas to appear include the back, shoulders, buttocks, and thighs. Lipomas are a very common soft tissue growth. They are soft and grow slowly. Most problems caused by a lipoma depend on where it is growing. DIAGNOSIS  A lipoma can be diagnosed with a physical exam. These tumors rarely become cancerous, but radiographic studies can help determine this for certain. Studies used may include:  Computerized X-ray scans (CT or CAT scan).  Computerized magnetic scans (MRI). TREATMENT  Small lipomas that are not causing problems may be watched. If a lipoma continues to enlarge or causes problems, removal is often the best treatment. Lipomas can also be removed to improve appearance. Surgery is done to remove the fatty cells and the surrounding capsule. Most often, this is done with medicine that numbs the area (local anesthetic). The removed tissue is examined under a microscope to make sure it is not cancerous. Keep all follow-up appointments with your caregiver. SEEK MEDICAL CARE IF:   The lipoma becomes larger or hard.  The lipoma becomes painful, red, or increasingly swollen. These could be signs of infection or a more serious condition. Document Released: 08/09/2002 Document Revised: 11/11/2011 Document Reviewed: 01/19/2010 [facility] Patient Information 2015 [place], [place]. This information is not intended to replace advice given to you by your health care provider. Make sure you discuss any questions you have with your health care provider.

## 2015-05-23 ENCOUNTER — Encounter: Payer: Self-pay | Admitting: Internal Medicine

## 2015-05-23 ENCOUNTER — Ambulatory Visit: Payer: Self-pay | Attending: Internal Medicine | Admitting: Internal Medicine

## 2015-05-23 VITALS — BP 117/72 | HR 100 | Temp 98.0°F | Resp 16 | Ht 67.0 in | Wt 119.2 lb

## 2015-05-23 DIAGNOSIS — J0111 Acute recurrent frontal sinusitis: Secondary | ICD-10-CM | POA: Insufficient documentation

## 2015-05-23 DIAGNOSIS — J45909 Unspecified asthma, uncomplicated: Secondary | ICD-10-CM | POA: Insufficient documentation

## 2015-05-23 DIAGNOSIS — Z7952 Long term (current) use of systemic steroids: Secondary | ICD-10-CM | POA: Insufficient documentation

## 2015-05-23 DIAGNOSIS — E780 Pure hypercholesterolemia: Secondary | ICD-10-CM | POA: Insufficient documentation

## 2015-05-23 DIAGNOSIS — K219 Gastro-esophageal reflux disease without esophagitis: Secondary | ICD-10-CM | POA: Insufficient documentation

## 2015-05-23 DIAGNOSIS — I10 Essential (primary) hypertension: Secondary | ICD-10-CM | POA: Insufficient documentation

## 2015-05-23 DIAGNOSIS — Z79899 Other long term (current) drug therapy: Secondary | ICD-10-CM | POA: Insufficient documentation

## 2015-05-23 DIAGNOSIS — J339 Nasal polyp, unspecified: Secondary | ICD-10-CM | POA: Insufficient documentation

## 2015-05-23 MED ORDER — AMOXICILLIN 500 MG PO CAPS: 500.0000 mg | ORAL_CAPSULE | Freq: Three times a day (TID) | ORAL | Status: DC

## 2015-05-23 MED ORDER — FLUTICASONE PROPIONATE 50 MCG/ACT NA SUSP: 2.0000 | Freq: Every day | NASAL | Status: DC

## 2015-05-23 MED ORDER — PREDNISONE 10 MG PO TABS: 10.0000 mg | ORAL_TABLET | Freq: Every day | ORAL | Status: DC

## 2015-05-23 NOTE — Progress Notes (Signed)
Patient ID: Angel Hebert, female   DOB: [birth year], 59 y.o.   MRN: [id number]  CC: sinus pressure   HPI: Angel Hebert is a 59 y.o. female here today for a follow up visit.  Patient has past medical history of HLD, HTN, GERD, asthma. Patient reports that she has continued to have sinus pressure in her face, cough, and nasal congestion. This is a chronic problem for the patient. She was seen last month for the same concerns and was treated of recurrent sinusitis. She has had a reoccurrence of this complaint since last week. She has tried flonase. She has been seen by Dr. Lovenia Shuck who told her she had polyps and needed removal several years ago. She denies fevers, chills, nausea, vomiting.   No Known Allergies Past Medical History  Diagnosis Date  . Asthma   . High cholesterol   . Hypertension   . GERD (gastroesophageal reflux disease)    Current Outpatient Prescriptions on File Prior to Visit  Medication Sig Dispense Refill  . albuterol (PROVENTIL HFA;VENTOLIN HFA) 108 (90 BASE) MCG/ACT inhaler Inhale 1-2 puffs into the lungs every 6 (six) hours as needed for wheezing. 1 Inhaler 2  . atorvastatin (LIPITOR) 10 MG tablet Take 10 mg by mouth daily.    . cetirizine (ZYRTEC) 10 MG tablet Take 1 tablet (10 mg total) by mouth daily. 30 tablet 0  . fluticasone (FLONASE) 50 MCG/ACT nasal spray Place 2 sprays into the nose daily. 16 g 0  . Fluticasone-Salmeterol (ADVAIR) 500-50 MCG/DOSE AEPB Inhale 1 puff into the lungs every 12 (twelve) hours. 60 each 2  . hydrochlorothiazide (HYDRODIURIL) 25 MG tablet Take 12.5 mg by mouth daily.    Marland Kitchen HYDROcodone-homatropine (HYCODAN) 5-1.5 MG/5ML syrup Take 5 mLs by mouth every 6 (six) hours as needed for cough. 30 mL 0  . irbesartan (AVAPRO) 150 MG tablet Take 150 mg by mouth daily after breakfast.    . meloxicam (MOBIC) 15 MG tablet Take 1 tablet (15 mg total) by mouth daily. 15 tablet 2  . montelukast (SINGULAIR) 10 MG tablet Take 10 mg by mouth at bedtime.    .  pantoprazole (PROTONIX) 20 MG tablet Take 20 mg by mouth daily.    . traZODone (DESYREL) 50 MG tablet Take 50 mg by mouth at bedtime.    Marland Kitchen amoxicillin-clavulanate (AUGMENTIN) 875-125 MG per tablet Take 1 tablet by mouth every 12 (twelve) hours. (Patient not taking: Reported on 05/23/2015) 20 tablet 0  . HYDROcodone-homatropine (HYDROMET) 5-1.5 MG/5ML syrup Take 5 mLs by mouth every 6 (six) hours as needed for cough. 120 mL 0  . predniSONE (DELTASONE) 10 MG tablet Take 1 tablet (10 mg total) by mouth daily with breakfast. 6-5-4-3-2-1. Start with 6 pills today and decrease by once each day (Patient not taking: Reported on 05/23/2015) 21 tablet 0  . [DISCONTINUED] loratadine (CLARITIN) 10 MG tablet Take 10 mg by mouth daily.     No current facility-administered medications on file prior to visit.   History reviewed. No pertinent family history. Social History   Social History  . Marital Status: Single    Spouse Name: N/A  . Number of Children: N/A  . Years of Education: N/A   Occupational History  . Not on file.   Social History Main Topics  . Smoking status: Never Smoker   . Smokeless tobacco: Not on file  . Alcohol Use: No  . Drug Use: No  . Sexual Activity: Not on file   Other Topics Concern  .  Not on file   Social History Narrative    Review of Systems: Other than what is stated in HPI, all other systems are negative.   Objective:   Filed Vitals:   05/23/15 1602  BP: 117/72  Pulse: 100  Temp: 98 F (36.7 C)  Resp: 16    Physical Exam  HENT:  Right Ear: External ear normal.  Left Ear: External ear normal.  Mouth/Throat: Oropharynx is clear and moist.  Left nasal polyp   Eyes: Right eye exhibits no discharge. Left eye exhibits no discharge.  Cardiovascular: Normal rate, regular rhythm and normal heart sounds.   Pulmonary/Chest: Effort normal and breath sounds normal. She has no wheezes.  Lymphadenopathy:    She has no cervical adenopathy.      Lab Results   Component Value Date   WBC 6.6 06/30/2009   HGB 12.8 06/30/2009   HCT 38.9 06/30/2009   MCV 99.0 06/30/2009   PLT 443* 06/30/2009   Lab Results  Component Value Date   CREATININE 0.89 02/20/2010   BUN 14 02/20/2010   NA 135 02/20/2010   K 4.4 02/20/2010   CL 99 02/20/2010   CO2 25 02/20/2010    No results found for: HGBA1C Lipid Panel     Component Value Date/Time   CHOL 184 02/20/2010 2258   TRIG 79 02/20/2010 2258   HDL 71 02/20/2010 2258   CHOLHDL 2.6 Ratio 02/20/2010 2258   VLDL 16 02/20/2010 2258   LDLCALC 97 02/20/2010 2258       Assessment and plan:   Angel Hebert was seen today for sinusitis.  Diagnoses and all orders for this visit:  Acute recurrent frontal sinusitis -     Ambulatory referral to ENT -     Begin amoxicillin (AMOXIL) 500 MG capsule; Take 1 capsule (500 mg total) by mouth 3 (three) times daily. -    Begin predniSONE (DELTASONE) 10 MG tablet; Take 1 tablet (10 mg total) by mouth daily with breakfast. 6-5-4-3-2-1. Start with 6 pills today and decrease by once each day Patient is very persistent and demanding that she has a antibiotic because it helps her breath better at night. I have explained to patient several times that she does not show signs of infection at this time.   Left nasal polyps -     Ambulatory referral to ENT -    Refill fluticasone (FLONASE) 50 MCG/ACT nasal spray; Place 2 sprays into both nostrils daily.  Return if symptoms worsen or fail to improve.      Lance Bosch, [facility] [phone] 05/23/2015, 4:12 PM

## 2015-05-23 NOTE — Progress Notes (Signed)
Patient complains of cough nasal congestion and sinus pressure for the  Past week OTC medications not working

## 2015-06-29 ENCOUNTER — Telehealth: Payer: Self-pay | Admitting: Internal Medicine

## 2015-06-29 NOTE — Telephone Encounter (Signed)
Patient used to be a patient at [facility] and wants to transfer her Rx's to [place] but they closed. Patient saw Feliciana Rossetti on 9/20 and is hoping she can get a refill for the following. Please follow up with pt if rx can be filled here at our pharmacy. Thank you.  Cetirizine 10mg  Pantoprazole 40mg 

## 2015-07-04 MED ORDER — CETIRIZINE HCL 10 MG PO TABS: 10.0000 mg | ORAL_TABLET | Freq: Every day | ORAL | Status: DC

## 2015-07-04 MED ORDER — PANTOPRAZOLE SODIUM 20 MG PO TBEC: 20.0000 mg | DELAYED_RELEASE_TABLET | Freq: Every day | ORAL | Status: DC

## 2015-07-04 NOTE — Telephone Encounter (Signed)
Patient is calling to check on the status of her refill and also is needing her inhaler refilled for her asthma. Please follow up with pt. Thank you.

## 2015-07-04 NOTE — Telephone Encounter (Signed)
Returned patient phone call Patient onto available Left message on voice mail to return our call

## 2015-07-05 NOTE — Telephone Encounter (Signed)
Patient called and requested to speak to nurse in regards to her medications. Patient did not specify if she needed refills. Please f/u

## 2015-07-06 ENCOUNTER — Ambulatory Visit (INDEPENDENT_AMBULATORY_CARE_PROVIDER_SITE_OTHER): Payer: Self-pay | Admitting: Otolaryngology

## 2015-07-10 ENCOUNTER — Emergency Department (HOSPITAL_COMMUNITY)
Admission: EM | Admit: 2015-07-10 | Discharge: 2015-07-10 | Disposition: A | Discharge status: Home / Self Care | Payer: Self-pay | Attending: Emergency Medicine | Admitting: Emergency Medicine

## 2015-07-10 ENCOUNTER — Emergency Department (HOSPITAL_COMMUNITY): Payer: Self-pay

## 2015-07-10 ENCOUNTER — Encounter (HOSPITAL_COMMUNITY): Payer: Self-pay | Admitting: *Deleted

## 2015-07-10 DIAGNOSIS — Y9289 Other specified places as the place of occurrence of the external cause: Secondary | ICD-10-CM | POA: Insufficient documentation

## 2015-07-10 DIAGNOSIS — Z79899 Other long term (current) drug therapy: Secondary | ICD-10-CM | POA: Insufficient documentation

## 2015-07-10 DIAGNOSIS — E782 Mixed hyperlipidemia: Secondary | ICD-10-CM | POA: Insufficient documentation

## 2015-07-10 DIAGNOSIS — X58XXXA Exposure to other specified factors, initial encounter: Secondary | ICD-10-CM | POA: Insufficient documentation

## 2015-07-10 DIAGNOSIS — J45901 Unspecified asthma with (acute) exacerbation: Secondary | ICD-10-CM | POA: Insufficient documentation

## 2015-07-10 DIAGNOSIS — J159 Unspecified bacterial pneumonia: Secondary | ICD-10-CM | POA: Insufficient documentation

## 2015-07-10 DIAGNOSIS — K219 Gastro-esophageal reflux disease without esophagitis: Secondary | ICD-10-CM | POA: Insufficient documentation

## 2015-07-10 DIAGNOSIS — Z7951 Long term (current) use of inhaled steroids: Secondary | ICD-10-CM | POA: Insufficient documentation

## 2015-07-10 DIAGNOSIS — Y9389 Activity, other specified: Secondary | ICD-10-CM | POA: Insufficient documentation

## 2015-07-10 DIAGNOSIS — Y998 Other external cause status: Secondary | ICD-10-CM | POA: Insufficient documentation

## 2015-07-10 DIAGNOSIS — I10 Essential (primary) hypertension: Secondary | ICD-10-CM | POA: Insufficient documentation

## 2015-07-10 DIAGNOSIS — S93401A Sprain of unspecified ligament of right ankle, initial encounter: Secondary | ICD-10-CM | POA: Insufficient documentation

## 2015-07-10 DIAGNOSIS — J189 Pneumonia, unspecified organism: Secondary | ICD-10-CM

## 2015-07-10 MED ORDER — NAPROXEN 250 MG PO TABS
500.0000 mg | ORAL_TABLET | Freq: Once | ORAL | Status: AC
  Administered 2015-07-10: 500 mg via ORAL
  Filled 2015-07-10: qty 2

## 2015-07-10 MED ORDER — BENZONATATE 100 MG PO CAPS: 100.0000 mg | ORAL_CAPSULE | Freq: Three times a day (TID) | ORAL | Status: DC

## 2015-07-10 MED ORDER — AZITHROMYCIN 250 MG PO TABS: 250.0000 mg | ORAL_TABLET | Freq: Every day | ORAL | Status: DC

## 2015-07-10 MED ORDER — GUAIFENESIN ER 600 MG PO TB12: 600.0000 mg | ORAL_TABLET | Freq: Two times a day (BID) | ORAL | Status: DC

## 2015-07-10 MED ORDER — BENZONATATE 100 MG PO CAPS
100.0000 mg | ORAL_CAPSULE | Freq: Once | ORAL | Status: AC
  Administered 2015-07-10: 100 mg via ORAL
  Filled 2015-07-10: qty 1

## 2015-07-10 MED ORDER — AZITHROMYCIN 250 MG PO TABS
500.0000 mg | ORAL_TABLET | Freq: Once | ORAL | Status: AC
  Administered 2015-07-10: 500 mg via ORAL
  Filled 2015-07-10: qty 2

## 2015-07-10 MED ORDER — ALBUTEROL SULFATE (2.5 MG/3ML) 0.083% IN NEBU
2.5000 mg | INHALATION_SOLUTION | Freq: Once | RESPIRATORY_TRACT | Status: AC
  Administered 2015-07-10: 2.5 mg via RESPIRATORY_TRACT
  Filled 2015-07-10: qty 3

## 2015-07-10 NOTE — Discharge Instructions (Signed)
Community-Acquired Pneumonia, Adult Take antibiotics as prescribed. Pneumonia is an infection of the lungs. There are different types of pneumonia. One type can develop while a person is in a hospital. A different type, called community-acquired pneumonia, develops in people who are not, or have not recently been, in the hospital or other health care facility.  CAUSES Pneumonia may be caused by bacteria, viruses, or funguses. Community-acquired pneumonia is often caused by Streptococcus pneumonia bacteria. These bacteria are often passed from one person to another by breathing in droplets from the cough or sneeze of an infected person. RISK FACTORS The condition is more likely to develop in:  People who havechronic diseases, such as chronic obstructive pulmonary disease (COPD), asthma, congestive heart failure, cystic fibrosis, diabetes, or kidney disease.  People who haveearly-stage or late-stage HIV.  People who havesickle cell disease.  People who havehad their spleen removed (splenectomy).  People who havepoor Human resources officer.  People who havemedical conditions that increase the risk of breathing in (aspirating) secretions their own mouth and nose.   People who havea weakened immune system (immunocompromised).  People who smoke.  People whotravel to areas where pneumonia-causing germs commonly exist.  People whoare around animal habitats or animals that have pneumonia-causing germs, including birds, bats, rabbits, cats, and farm animals. SYMPTOMS Symptoms of this condition include:  Adry cough.  A wet (productive) cough.  Fever.  Sweating.  Chest pain, especially when breathing deeply or coughing.  Rapid breathing or difficulty breathing.  Shortness of breath.  Shaking chills.  Fatigue.  Muscle aches. DIAGNOSIS Your health care provider will take a medical history and perform a physical exam. You may also have other tests, including:  Imaging  studies of your chest, including X-rays.  Tests to check your blood oxygen level and other blood gases.  Other tests on blood, mucus (sputum), fluid around your lungs (pleural fluid), and urine. If your pneumonia is severe, other tests may be done to identify the specific cause of your illness. TREATMENT The type of treatment that you receive depends on many factors, such as the cause of your pneumonia, the medicines you take, and other medical conditions that you have. For most adults, treatment and recovery from pneumonia may occur at home. In some cases, treatment must happen in a hospital. Treatment may include:  Antibiotic medicines, if the pneumonia was caused by bacteria.  Antiviral medicines, if the pneumonia was caused by a virus.  Medicines that are given by mouth or through an IV tube.  Oxygen.  Respiratory therapy. Although rare, treating severe pneumonia may include:  Mechanical ventilation. This is done if you are not breathing well on your own and you cannot maintain a safe blood oxygen level.  Thoracentesis. This procedureremoves fluid around one lung or both lungs to help you breathe better. HOME CARE INSTRUCTIONS  Take over-the-counter and prescription medicines only as told by your health care provider.  Only takecough medicine if you are losing sleep. Understand that cough medicine can prevent your body's natural ability to remove mucus from your lungs.  If you were prescribed an antibiotic medicine, take it as told by your health care provider. Do not stop taking the antibiotic even if you start to feel better.  Sleep in a semi-upright position at night. Try sleeping in a reclining chair, or place a few pillows under your head.  Do not use tobacco products, including cigarettes, chewing tobacco, and e-cigarettes. If you need help quitting, ask your health care provider.  Drink enough water  to keep your urine clear or pale yellow. This will help to thin out  mucus secretions in your lungs. PREVENTION There are ways that you can decrease your risk of developing community-acquired pneumonia. Consider getting a pneumococcal vaccine if:  You are older than 58 years of age.  You are older than 58 years of age and are undergoing cancer treatment, have chronic lung disease, or have other medical conditions that affect your immune system. Ask your health care provider if this applies to you. There are different types and schedules of pneumococcal vaccines. Ask your health care provider which vaccination option is best for you. You may also prevent community-acquired pneumonia if you take these actions:  Get an influenza vaccine every year. Ask your health care provider which type of influenza vaccine is best for you.  Go to the dentist on a regular basis.  Wash your hands often. Use hand sanitizer if soap and water are not available. SEEK MEDICAL CARE IF:  You have a fever.  You are losing sleep because you cannot control your cough with cough medicine. SEEK IMMEDIATE MEDICAL CARE IF:  You have worsening shortness of breath.  You have increased chest pain.  Your sickness becomes worse, especially if you are an older adult or have a weakened immune system.  You cough up blood.   This information is not intended to replace advice given to you by your health care provider. Make sure you discuss any questions you have with your health care provider.   Document Released: 08/19/2005 Document Revised: 05/10/2015 Document Reviewed: 12/14/2014 Elsevier Interactive Patient Education 2016 [place]. Compress. Elevate. An ankle sprain is an injury to the strong, fibrous tissues (ligaments) that hold your ankle bones together.  HOME CARE   Put ice on your ankle for 1-2 days or as told by your doctor.  Put ice in a plastic bag.  Place a towel between your skin and the bag.  Leave the ice on for 15-20 minutes at a time,  every 2 hours while you are awake.  Only take medicine as told by your doctor.  Raise (elevate) your injured ankle above the level of your heart as much as possible for 2-3 days.  Use crutches if your doctor tells you to. Slowly put your own weight on the affected ankle. Use the crutches until you can walk without pain.  If you have a plaster splint:  Do not rest it on anything harder than a pillow for 24 hours.  Do not put weight on it.  Do not get it wet.  Take it off to shower or bathe.  If given, use an elastic wrap or support stocking for support. Take the wrap off if your toes lose feeling (numb), tingle, or turn cold or blue.  If you have an air splint:  Add or let out air to make it comfortable.  Take it off at night and to shower and bathe.  Wiggle your toes and move your ankle up and down often while you are wearing it. GET HELP IF:  You have rapidly increasing bruising or puffiness (swelling).  Your toes feel very cold.  You lose feeling in your foot.  Your medicine does not help your pain. GET HELP RIGHT AWAY IF:   Your toes lose feeling (numb) or turn blue.  You have severe pain that is increasing. MAKE SURE YOU:   Understand these instructions.  Will watch your condition.  Will get help right  away if you are not doing well or get worse.   This information is not intended to replace advice given to you by your health care provider. Make sure you discuss any questions you have with your health care provider.   Document Released: 02/05/2008 Document Revised: 09/09/2014 Document Reviewed: 03/02/2012 Elsevier Interactive Patient Education [employer].

## 2015-07-10 NOTE — ED Notes (Signed)
Pt reports recent cough and cold symptoms, cough is non productive. Reports no relief with inhalers at home. No resp distress noted at this time. Pt reports tripping and falling on Friday, having right ankle pain and swelling since.

## 2015-07-10 NOTE — ED Provider Notes (Signed)
CSN: [id number]     Arrival date & time 07/10/15  2836 History  By signing my name below, I, Essence Howell, attest that this documentation has been prepared under the direction and in the presence of Ottie Glazier, PA-C Electronically Signed: Ladene Artist, ED Scribe 07/11/2015 at 2:05 PM.   Chief Complaint  Patient presents with  . Cough  . Foot Pain   The history is provided by the patient. No language interpreter was used.   HPI Comments: Laquitta Dominski is a 59 y.o. female, with a h/o hypertension and asthma, who presents to the Emergency Department complaining of persistent dry cough for the past week. Pt reports worsened cough at night and with lying down. She also reports associated nasal congestion that is worsened at night. She has tried her rescue inhaler without significant relief. She denies fever. No h/o COPD or tobacco use. Pt also presents with a trip and fall that occurred 3 days ago. She reports secondary right ankle pain with associated swelling since the fall 3 days ago. Pain is exacerbated with palpation and movement. She has tried Tylenol without significant relief.   Past Medical History  Diagnosis Date  . Asthma   . High cholesterol   . Hypertension   . GERD (gastroesophageal reflux disease)    Past Surgical History  Procedure Laterality Date  . Abdominal hysterectomy     History reviewed. No pertinent family history. Social History  Substance Use Topics  . Smoking status: Never Smoker   . Smokeless tobacco: None  . Alcohol Use: No   OB History    No data available     Review of Systems  Constitutional: Negative for fever.  HENT: Positive for congestion.   Respiratory: Positive for cough.   Musculoskeletal: Positive for joint swelling and arthralgias.   Allergies  Review of patient's allergies indicates no known allergies.  Home Medications   Prior to Admission medications   Medication Sig Start Date End Date Taking? Authorizing Provider   albuterol (PROVENTIL HFA;VENTOLIN HFA) 108 (90 BASE) MCG/ACT inhaler Inhale 1-2 puffs into the lungs every 6 (six) hours as needed for wheezing. 10/03/12   Harden Mo, MD  amoxicillin (AMOXIL) 500 MG capsule Take 1 capsule (500 mg total) by mouth 3 (three) times daily. 05/23/15   Lance Bosch, NP  atorvastatin (LIPITOR) 10 MG tablet Take 10 mg by mouth daily.    Historical Provider, MD  azithromycin (ZITHROMAX) 250 MG tablet Take 1 tablet (250 mg total) by mouth daily. Take 1 tablet every day until finished starting on Tuesday, 07/11/15. You received your first dose in the ED. 07/10/15   Ottie Glazier, PA-C  benzonatate (TESSALON) 100 MG capsule Take 1 capsule (100 mg total) by mouth every 8 (eight) hours. 07/10/15   Drusilla Wampole Patel-Mills, PA-C  cetirizine (ZYRTEC) 10 MG tablet Take 1 tablet (10 mg total) by mouth daily. 07/04/15   Lance Bosch, NP  fluticasone (FLONASE) 50 MCG/ACT nasal spray Place 2 sprays into both nostrils daily. 05/23/15   Lance Bosch, NP  Fluticasone-Salmeterol (ADVAIR) 500-50 MCG/DOSE AEPB Inhale 1 puff into the lungs every 12 (twelve) hours. 10/03/12   Harden Mo, MD  guaiFENesin (MUCINEX) 600 MG 12 hr tablet Take 1 tablet (600 mg total) by mouth 2 (two) times daily. 07/10/15   Draco Malczewski Patel-Mills, PA-C  hydrochlorothiazide (HYDRODIURIL) 25 MG tablet Take 12.5 mg by mouth daily.    Historical Provider, MD  HYDROcodone-homatropine (HYCODAN) 5-1.5 MG/5ML syrup Take 5 mLs by  mouth every 6 (six) hours as needed for cough. 01/07/15   Robyn Haber, MD  HYDROcodone-homatropine (HYDROMET) 5-1.5 MG/5ML syrup Take 5 mLs by mouth every 6 (six) hours as needed for cough. 01/07/15   Robyn Haber, MD  irbesartan (AVAPRO) 150 MG tablet Take 150 mg by mouth daily after breakfast.    Historical Provider, MD  meloxicam (MOBIC) 15 MG tablet Take 1 tablet (15 mg total) by mouth daily. 10/03/12   Harden Mo, MD  montelukast (SINGULAIR) 10 MG tablet Take 10 mg by mouth at bedtime.     Historical Provider, MD  pantoprazole (PROTONIX) 20 MG tablet Take 1 tablet (20 mg total) by mouth daily. 07/04/15   Lance Bosch, NP  predniSONE (DELTASONE) 10 MG tablet Take 1 tablet (10 mg total) by mouth daily with breakfast. 6-5-4-3-2-1. Start with 6 pills today and decrease by once each day 05/23/15   Lance Bosch, NP  traZODone (DESYREL) 50 MG tablet Take 50 mg by mouth at bedtime.    Historical Provider, MD   BP 141/82 mmHg  Pulse 80  Temp(Src) 97.4 F (36.3 C) (Oral)  Resp 17  Ht 5\' 7"  (1.702 m)  Wt 127 lb (57.607 kg)  BMI 19.89 kg/m2  SpO2 99% Physical Exam  Constitutional: She is oriented to person, place, and time. She appears well-developed and well-nourished. No distress.  HENT:  Head: Normocephalic and atraumatic.  Eyes: Conjunctivae and EOM are normal.  Neck: Neck supple. No tracheal deviation present.  Cardiovascular: Normal rate.   Pulmonary/Chest: Effort normal. No respiratory distress. She has wheezes.  L sided wheezing throughout but no respiratory distress or difficulty breathing.   Musculoskeletal: Normal range of motion.  Moderate swelling to the lateral R ankle with tenderness but no erythema. No 5th metatarsal pain. Able to dorsi and plantar flex. Able to flex and extend toes without difficulty. 2+ DP pulse.  Neurological: She is alert and oriented to person, place, and time.  Skin: Skin is warm and dry.  Psychiatric: She has a normal mood and affect. Her behavior is normal.  Nursing note and vitals reviewed.  ED Course  Procedures (including critical care time) DIAGNOSTIC STUDIES: Oxygen Saturation is 99% on RA, normal by my interpretation.    COORDINATION OF CARE: 11:35 AM-Discussed treatment plan which includes XR and nebulizer treatment with pt at bedside and pt agreed to plan.   Labs Review Labs Reviewed - No data to display  Imaging Review Dg Chest 2 View  07/10/2015  CLINICAL DATA:  Cough and shortness of breath for 1 week. EXAM: CHEST  2  VIEW COMPARISON:  10/20/2013 FINDINGS: Cardiomediastinal silhouette is within normal limits. The lungs remain mildly hyperinflated. There are new ill-defined opacities in both lung bases. No pleural effusion or pneumothorax is identified. No acute osseous abnormality is seen. IMPRESSION: New, mild bibasilar opacities suspicious for pneumonia. Electronically Signed   By: Logan Bores M.D.   On: 07/10/2015 13:54   Dg Ankle Complete Right  07/10/2015  CLINICAL DATA:  Fall, pain, swelling, bruising in right ankle. EXAM: RIGHT ANKLE - COMPLETE 3+ VIEW COMPARISON:  None FINDINGS: Old healed distal tibial fracture. Diffuse soft tissue swelling. No acute fracture, subluxation or dislocation. Joint spaces are maintained. IMPRESSION: No acute bony abnormality. Electronically Signed   By: Rolm Baptise M.D.   On: 07/10/2015 12:11   I have personally reviewed and evaluated these image results as part of my medical decision-making.   EKG Interpretation None  MDM   Final diagnoses:  Ankle sprain, right, initial encounter  Community acquired pneumonia  Patient presents for cough and right ankle pain after fall. Chest x-ray shows new bibasilar opacities that are suspicious for pneumonia. She is well-appearing and in no acute distress. She is afebrile. Patient was given prescription for Zithromax and I explained that she would need to follow-up in a couple of days with her primary care physician. I also explained she could take Mucinex for her congestion. Medications  albuterol (PROVENTIL) (2.5 MG/3ML) 0.083% nebulizer solution 2.5 mg (2.5 mg Nebulization Given 07/10/15 1225)  naproxen (NAPROSYN) tablet 500 mg (500 mg Oral Given 07/10/15 1225)  benzonatate (TESSALON) capsule 100 mg (100 mg Oral Given 07/10/15 1300)  azithromycin (ZITHROMAX) tablet 500 mg (500 mg Oral Given 07/10/15 1441)   Right ankle is negative for acute bony abnormality. She has an old healed distal tibial fracture. I discussed RICE with  the patient. I personally performed the services described in this documentation, which was scribed in my presence. The recorded information has been reviewed and is accurate.   Ottie Glazier, PA-C 07/11/15 [place], MD 07/12/15 1011

## 2015-07-24 ENCOUNTER — Encounter: Payer: Self-pay | Admitting: Internal Medicine

## 2015-07-24 ENCOUNTER — Ambulatory Visit: Payer: Self-pay | Attending: Internal Medicine | Admitting: Internal Medicine

## 2015-07-24 VITALS — BP 172/99 | HR 73 | Temp 98.0°F | Resp 16 | Ht 67.0 in | Wt 116.2 lb

## 2015-07-24 DIAGNOSIS — I10 Essential (primary) hypertension: Secondary | ICD-10-CM | POA: Insufficient documentation

## 2015-07-24 DIAGNOSIS — J4530 Mild persistent asthma, uncomplicated: Secondary | ICD-10-CM | POA: Insufficient documentation

## 2015-07-24 DIAGNOSIS — Z79899 Other long term (current) drug therapy: Secondary | ICD-10-CM | POA: Insufficient documentation

## 2015-07-24 DIAGNOSIS — Z791 Long term (current) use of non-steroidal anti-inflammatories (NSAID): Secondary | ICD-10-CM | POA: Insufficient documentation

## 2015-07-24 DIAGNOSIS — G4700 Insomnia, unspecified: Secondary | ICD-10-CM | POA: Insufficient documentation

## 2015-07-24 DIAGNOSIS — Z7951 Long term (current) use of inhaled steroids: Secondary | ICD-10-CM | POA: Insufficient documentation

## 2015-07-24 DIAGNOSIS — E785 Hyperlipidemia, unspecified: Secondary | ICD-10-CM | POA: Insufficient documentation

## 2015-07-24 DIAGNOSIS — J339 Nasal polyp, unspecified: Secondary | ICD-10-CM | POA: Insufficient documentation

## 2015-07-24 DIAGNOSIS — K219 Gastro-esophageal reflux disease without esophagitis: Secondary | ICD-10-CM | POA: Insufficient documentation

## 2015-07-24 LAB — COMPLETE METABOLIC PANEL WITH GFR
ALT: 8 U/L (ref 6–29)
AST: 22 U/L (ref 10–35)
Albumin: 3.6 g/dL (ref 3.6–5.1)
Alkaline Phosphatase: 76 U/L (ref 33–130)
BUN: 6 mg/dL — ABNORMAL LOW (ref 7–25)
CHLORIDE: 105 mmol/L (ref 98–110)
CO2: 25 mmol/L (ref 20–31)
Calcium: 9.2 mg/dL (ref 8.6–10.4)
Creat: 0.65 mg/dL (ref 0.50–1.05)
GFR, Est African American: >89 mL/min (ref >=60)
GLUCOSE: 88 mg/dL (ref 65–99)
POTASSIUM: 4.6 mmol/L (ref 3.5–5.3)
SODIUM: 141 mmol/L (ref 135–146)
TOTAL PROTEIN: 7.1 g/dL (ref 6.1–8.1)
Total Bilirubin: 0.5 mg/dL (ref 0.2–1.2)

## 2015-07-24 MED ORDER — PANTOPRAZOLE SODIUM 20 MG PO TBEC: 20.0000 mg | DELAYED_RELEASE_TABLET | Freq: Every day | ORAL | Status: DC

## 2015-07-24 MED ORDER — ALBUTEROL SULFATE HFA 108 (90 BASE) MCG/ACT IN AERS: 1.0000 | INHALATION_SPRAY | Freq: Four times a day (QID) | RESPIRATORY_TRACT | Status: DC | PRN

## 2015-07-24 MED ORDER — MONTELUKAST SODIUM 10 MG PO TABS: 10.0000 mg | ORAL_TABLET | Freq: Every day | ORAL | Status: DC

## 2015-07-24 MED ORDER — PREDNISONE 20 MG PO TABS: 20.0000 mg | ORAL_TABLET | Freq: Every day | ORAL | Status: DC

## 2015-07-24 MED ORDER — FLUTICASONE-SALMETEROL 500-50 MCG/DOSE IN AEPB: 1.0000 | INHALATION_SPRAY | Freq: Two times a day (BID) | RESPIRATORY_TRACT | Status: DC

## 2015-07-24 MED ORDER — IRBESARTAN 150 MG PO TABS: 150.0000 mg | ORAL_TABLET | Freq: Every day | ORAL | Status: DC

## 2015-07-24 MED ORDER — HYDROCHLOROTHIAZIDE 25 MG PO TABS: 12.5000 mg | ORAL_TABLET | Freq: Every day | ORAL | Status: DC

## 2015-07-24 MED ORDER — ATORVASTATIN CALCIUM 10 MG PO TABS: 10.0000 mg | ORAL_TABLET | Freq: Every day | ORAL | Status: DC

## 2015-07-24 MED ORDER — CETIRIZINE HCL 10 MG PO TABS: 10.0000 mg | ORAL_TABLET | Freq: Every day | ORAL | Status: DC

## 2015-07-24 MED ORDER — TRAZODONE HCL 50 MG PO TABS: 50.0000 mg | ORAL_TABLET | Freq: Every day | ORAL | Status: DC

## 2015-07-24 NOTE — Progress Notes (Signed)
Patient ID: Angel Hebert, female   DOB: 12/10/1955, 59 y.o.   MRN: [id number]  CC: HTN. Asthma  HPI: Angel Hebert is a 59 y.o. female here today for a follow up visit.  Patient has past medical history of hypertension, HLD, asthma, and GERD. Patient reports that she has been out of all her inhalers for the past few of weeks. She states that she was getting her medication free at [facility] but they recently closed down. Since being out of her inhalers she has been having more difficulty with cough and SOB. She is getting up nightly to cough and catch her breath. Patient reports that she takes her blood pressure medication every morning without complications. Today her pressure is moderately elevated and reports that she took her medication today. She denies headaches, chest pain, palpitations, edema. She does not check her pressures at home.  Patient would like refills of all her medications today.  Patient has No headache, No chest pain, No abdominal pain - No Nausea, No new weakness tingling or numbness, No Cough - SOB.  No Known Allergies Past Medical History  Diagnosis Date  . Asthma   . High cholesterol   . Hypertension   . GERD (gastroesophageal reflux disease)    Current Outpatient Prescriptions on File Prior to Visit  Medication Sig Dispense Refill  . albuterol (PROVENTIL HFA;VENTOLIN HFA) 108 (90 BASE) MCG/ACT inhaler Inhale 1-2 puffs into the lungs every 6 (six) hours as needed for wheezing. 1 Inhaler 2  . atorvastatin (LIPITOR) 10 MG tablet Take 10 mg by mouth daily.    . cetirizine (ZYRTEC) 10 MG tablet Take 1 tablet (10 mg total) by mouth daily. 30 tablet 0  . Fluticasone-Salmeterol (ADVAIR) 500-50 MCG/DOSE AEPB Inhale 1 puff into the lungs every 12 (twelve) hours. 60 each 2  . hydrochlorothiazide (HYDRODIURIL) 25 MG tablet Take 12.5 mg by mouth daily.    . irbesartan (AVAPRO) 150 MG tablet Take 150 mg by mouth daily after breakfast.    . meloxicam (MOBIC) 15 MG tablet  Take 1 tablet (15 mg total) by mouth daily. 15 tablet 2  . montelukast (SINGULAIR) 10 MG tablet Take 10 mg by mouth at bedtime.    . pantoprazole (PROTONIX) 20 MG tablet Take 1 tablet (20 mg total) by mouth daily. 30 tablet 1  . traZODone (DESYREL) 50 MG tablet Take 50 mg by mouth at bedtime.    Marland Kitchen azithromycin (ZITHROMAX) 250 MG tablet Take 1 tablet (250 mg total) by mouth daily. Take 1 tablet every day until finished starting on Tuesday, 07/11/15. You received your first dose in the ED. 4 tablet 0  . benzonatate (TESSALON) 100 MG capsule Take 1 capsule (100 mg total) by mouth every 8 (eight) hours. 21 capsule 0  . fluticasone (FLONASE) 50 MCG/ACT nasal spray Place 2 sprays into both nostrils daily. 16 g 2  . guaiFENesin (MUCINEX) 600 MG 12 hr tablet Take 1 tablet (600 mg total) by mouth 2 (two) times daily. 10 tablet 0  . HYDROcodone-homatropine (HYCODAN) 5-1.5 MG/5ML syrup Take 5 mLs by mouth every 6 (six) hours as needed for cough. 30 mL 0  . HYDROcodone-homatropine (HYDROMET) 5-1.5 MG/5ML syrup Take 5 mLs by mouth every 6 (six) hours as needed for cough. 120 mL 0  . [DISCONTINUED] loratadine (CLARITIN) 10 MG tablet Take 10 mg by mouth daily.     No current facility-administered medications on file prior to visit.   History reviewed. No pertinent family history. Social History  Social History  . Marital Status: Single    Spouse Name: N/A  . Number of Children: N/A  . Years of Education: N/A   Occupational History  . Not on file.   Social History Main Topics  . Smoking status: Never Smoker   . Smokeless tobacco: Not on file  . Alcohol Use: No  . Drug Use: No  . Sexual Activity: Not on file   Other Topics Concern  . Not on file   Social History Narrative    Review of Systems: Constitutional: Negative for fever, chills, diaphoresis, activity change, appetite change and fatigue. HENT: Negative for ear pain, nosebleeds, congestion, facial swelling, rhinorrhea, neck pain, neck  stiffness and ear discharge.  Eyes: Negative for pain, discharge, redness, itching and visual disturbance. Respiratory: Negative for cough, choking, chest tightness, shortness of breath, wheezing and stridor.  Cardiovascular: Negative for chest pain, palpitations and leg swelling. Gastrointestinal: Negative for abdominal distention. Genitourinary: Negative for dysuria, urgency, frequency, hematuria, flank pain, decreased urine volume, difficulty urinating and dyspareunia.  Musculoskeletal: Negative for back pain, joint swelling, arthralgias and gait problem. Neurological: Negative for dizziness, tremors, seizures, syncope, facial asymmetry, speech difficulty, weakness, light-headedness, numbness and headaches.  Hematological: Negative for adenopathy. Does not bruise/bleed easily. Psychiatric/Behavioral: Negative for hallucinations, behavioral problems, confusion, dysphoric mood, decreased concentration and agitation.    Objective:   Filed Vitals:   07/24/15 1140 07/24/15 1151  BP: 183/94 172/99  Pulse: 71 73  Temp: 98 F (36.7 C)   Resp: 16     Physical Exam  Constitutional: She is oriented to person, place, and time.  HENT:  Right Ear: External ear normal.  Left Ear: External ear normal.  Mouth/Throat: Oropharynx is clear and moist.  Bilateral large nasal polyps  Cardiovascular: Normal rate, regular rhythm and normal heart sounds.   Pulmonary/Chest: Effort normal and breath sounds normal. She has no wheezes.  Musculoskeletal:  Right swollen ankle from ankle injry earlier this month. Some tenderness to touch  Lymphadenopathy:    She has no cervical adenopathy.  Neurological: She is alert and oriented to person, place, and time.  Skin: Skin is warm and dry.  Psychiatric: She has a normal mood and affect.     Lab Results  Component Value Date   WBC 6.6 06/30/2009   HGB 12.8 06/30/2009   HCT 38.9 06/30/2009   MCV 99.0 06/30/2009   PLT 443* 06/30/2009   Lab Results   Component Value Date   CREATININE 0.89 02/20/2010   BUN 14 02/20/2010   NA 135 02/20/2010   K 4.4 02/20/2010   CL 99 02/20/2010   CO2 25 02/20/2010    No results found for: HGBA1C Lipid Panel     Component Value Date/Time   CHOL 184 02/20/2010 2258   TRIG 79 02/20/2010 2258   HDL 71 02/20/2010 2258   CHOLHDL 2.6 Ratio 02/20/2010 2258   VLDL 16 02/20/2010 2258   LDLCALC 97 02/20/2010 2258       Assessment and plan:   Gola was seen today for cough.  Diagnoses and all orders for this visit:  Essential hypertension -     hydrochlorothiazide (HYDRODIURIL) 25 MG tablet; Take 0.5 tablets (12.5 mg total) by mouth daily. For blood pressure -     irbesartan (AVAPRO) 150 MG tablet; Take 1 tablet (150 mg total) by mouth daily after breakfast. For blood pressure -     COMPLETE METABOLIC PANEL WITH GFR Patient's blood pressure is elevated today but the previous visit  was normal. I will not make changes today but I will bring her back for a recheck. DASH diet advised.  Asthma, mild persistent, uncomplicated -     albuterol (PROVENTIL HFA;VENTOLIN HFA) 108 (90 BASE) MCG/ACT inhaler; Inhale 1-2 puffs into the lungs every 6 (six) hours as needed for wheezing. -     cetirizine (ZYRTEC) 10 MG tablet; Take 1 tablet (10 mg total) by mouth daily. -     Fluticasone-Salmeterol (ADVAIR) 500-50 MCG/DOSE AEPB; Inhale 1 puff into the lungs every 12 (twelve) hours. -     montelukast (SINGULAIR) 10 MG tablet; Take 1 tablet (10 mg total) by mouth at bedtime. Stable, meds refilled  HLD (hyperlipidemia) -     atorvastatin (LIPITOR) 10 MG tablet; Take 1 tablet (10 mg total) by mouth daily at 6 PM. For cholesterol -     Lipid panel; Future Patient has no Lipid panel on file. Will have her come back for a recheck  Gastroesophageal reflux disease, esophagitis presence not specified -     pantoprazole (PROTONIX) 20 MG tablet; Take 1 tablet (20 mg total) by mouth daily. Stable, meds  refilled  Insomnia -     traZODone (DESYREL) 50 MG tablet; Take 1 tablet (50 mg total) by mouth at bedtime. She will only take as needed, not daily  Nasal polyps -     predniSONE (DELTASONE) 20 MG tablet; Take 1 tablet (20 mg total) by mouth daily with breakfast. I have stressed that she will need to see a ENT surgeon. Explained that steroid therapy is not recommended to take repeatedly. She will continue Flonase daily to help until she is able to get insurance.   Return for 1 week fasting labs and BP check with RN and 3 mo PCP .        Lance Bosch, [facility] [phone] 07/24/2015, 11:54 AM

## 2015-07-24 NOTE — Progress Notes (Signed)
Patient  Here for follow up on her athma and HTN Patient has been out of her medications for a couple of weeks Also complains of her asthma bothering her Has been coughing and congested

## 2015-07-25 ENCOUNTER — Telehealth: Payer: Self-pay

## 2015-07-25 NOTE — Telephone Encounter (Signed)
Patient not available Unable to leave message Voice mail box has not been set up

## 2015-07-25 NOTE — Telephone Encounter (Signed)
-----   Message from Lance Bosch, NP sent at 07/25/2015  9:41 AM EST ----- Labs are within normal limits. Still needs to have fasting lipid drawn very soon.

## 2015-08-08 ENCOUNTER — Encounter: Payer: Self-pay | Admitting: Pharmacist

## 2015-08-08 ENCOUNTER — Other Ambulatory Visit: Payer: Self-pay

## 2015-10-08 ENCOUNTER — Emergency Department (HOSPITAL_COMMUNITY): Payer: Self-pay

## 2015-10-08 ENCOUNTER — Encounter (HOSPITAL_COMMUNITY): Payer: Self-pay | Admitting: Physical Medicine and Rehabilitation

## 2015-10-08 ENCOUNTER — Inpatient Hospital Stay (HOSPITAL_COMMUNITY)
Admission: EM | Admit: 2015-10-08 | Discharge: 2015-10-15 | DRG: 871 | Disposition: A | Discharge status: Home / Self Care | Payer: Self-pay | Attending: Internal Medicine | Admitting: Internal Medicine

## 2015-10-08 DIAGNOSIS — K219 Gastro-esophageal reflux disease without esophagitis: Secondary | ICD-10-CM | POA: Diagnosis present

## 2015-10-08 DIAGNOSIS — J90 Pleural effusion, not elsewhere classified: Secondary | ICD-10-CM | POA: Diagnosis present

## 2015-10-08 DIAGNOSIS — R5381 Other malaise: Secondary | ICD-10-CM | POA: Insufficient documentation

## 2015-10-08 DIAGNOSIS — A419 Sepsis, unspecified organism: Secondary | ICD-10-CM

## 2015-10-08 DIAGNOSIS — J9691 Respiratory failure, unspecified with hypoxia: Secondary | ICD-10-CM | POA: Diagnosis present

## 2015-10-08 DIAGNOSIS — A403 Sepsis due to Streptococcus pneumoniae: Secondary | ICD-10-CM | POA: Diagnosis present

## 2015-10-08 DIAGNOSIS — E46 Unspecified protein-calorie malnutrition: Secondary | ICD-10-CM | POA: Diagnosis present

## 2015-10-08 DIAGNOSIS — R7881 Bacteremia: Secondary | ICD-10-CM | POA: Diagnosis present

## 2015-10-08 DIAGNOSIS — L8990 Pressure ulcer of unspecified site, unspecified stage: Secondary | ICD-10-CM | POA: Insufficient documentation

## 2015-10-08 DIAGNOSIS — E872 Acidosis: Secondary | ICD-10-CM | POA: Diagnosis present

## 2015-10-08 DIAGNOSIS — D539 Nutritional anemia, unspecified: Secondary | ICD-10-CM | POA: Diagnosis present

## 2015-10-08 DIAGNOSIS — J45909 Unspecified asthma, uncomplicated: Secondary | ICD-10-CM | POA: Diagnosis present

## 2015-10-08 DIAGNOSIS — J9601 Acute respiratory failure with hypoxia: Secondary | ICD-10-CM

## 2015-10-08 DIAGNOSIS — J189 Pneumonia, unspecified organism: Secondary | ICD-10-CM | POA: Diagnosis present

## 2015-10-08 DIAGNOSIS — J9809 Other diseases of bronchus, not elsewhere classified: Secondary | ICD-10-CM

## 2015-10-08 DIAGNOSIS — D509 Iron deficiency anemia, unspecified: Secondary | ICD-10-CM | POA: Insufficient documentation

## 2015-10-08 DIAGNOSIS — A409 Streptococcal sepsis, unspecified: Principal | ICD-10-CM | POA: Diagnosis present

## 2015-10-08 DIAGNOSIS — E876 Hypokalemia: Secondary | ICD-10-CM | POA: Diagnosis present

## 2015-10-08 DIAGNOSIS — I10 Essential (primary) hypertension: Secondary | ICD-10-CM | POA: Diagnosis present

## 2015-10-08 DIAGNOSIS — K7200 Acute and subacute hepatic failure without coma: Secondary | ICD-10-CM | POA: Diagnosis present

## 2015-10-08 DIAGNOSIS — R6520 Severe sepsis without septic shock: Secondary | ICD-10-CM | POA: Diagnosis present

## 2015-10-08 LAB — CBC WITH DIFFERENTIAL/PLATELET
BASOS ABS: 0 10*3/uL (ref 0.0–0.1)
BASOS PCT: 0 %
EOS PCT: 0 %
Eosinophils Absolute: 0 10*3/uL (ref 0.0–0.7)
HEMATOCRIT: 28.6 % — AB (ref 36.0–46.0)
HEMOGLOBIN: 9.4 g/dL — AB (ref 12.0–15.0)
LYMPHS ABS: 0.4 10*3/uL — AB (ref 0.7–4.0)
Lymphocytes Relative: 8 %
MCH: 26.5 pg (ref 26.0–34.0)
MCHC: 32.9 g/dL (ref 30.0–36.0)
MCV: 80.6 fL (ref 78.0–100.0)
MONOS PCT: 1 %
Monocytes Absolute: 0.1 10*3/uL (ref 0.1–1.0)
NEUTROS ABS: 4.7 10*3/uL (ref 1.7–7.7)
NEUTROS PCT: 91 %
PLATELETS: 366 10*3/uL (ref 150–400)
RBC: 3.55 MIL/uL — ABNORMAL LOW (ref 3.87–5.11)
RDW: 23.1 % — ABNORMAL HIGH (ref 11.5–15.5)
WBC: 5.2 10*3/uL (ref 4.0–10.5)

## 2015-10-08 LAB — I-STAT CG4 LACTIC ACID, ED
LACTIC ACID, VENOUS: 6.65 mmol/L — AB (ref 0.5–2.0)
LACTIC ACID, VENOUS: 4.29 mmol/L — AB (ref 0.5–2.0)
Lactic Acid, Venous: 6.22 mmol/L (ref 0.5–2.0)

## 2015-10-08 LAB — COMPREHENSIVE METABOLIC PANEL
ALK PHOS: 64 U/L (ref 38–126)
ALT: 54 U/L (ref 14–54)
AST: 405 U/L — AB (ref 15–41)
Albumin: 2.3 g/dL — ABNORMAL LOW (ref 3.5–5.0)
Anion gap: 20 — ABNORMAL HIGH (ref 5–15)
BUN: 19 mg/dL (ref 6–20)
CALCIUM: 9 mg/dL (ref 8.9–10.3)
CHLORIDE: 91 mmol/L — AB (ref 101–111)
CO2: 22 mmol/L (ref 22–32)
CREATININE: 0.88 mg/dL (ref 0.44–1.00)
Glucose, Bld: 75 mg/dL (ref 65–99)
Potassium: 2.7 mmol/L — CL (ref 3.5–5.1)
Sodium: 133 mmol/L — ABNORMAL LOW (ref 135–145)
Total Bilirubin: 1.2 mg/dL (ref 0.3–1.2)
Total Protein: 7.2 g/dL (ref 6.5–8.1)

## 2015-10-08 LAB — I-STAT ARTERIAL BLOOD GAS, ED
ACID-BASE DEFICIT: 2 mmol/L (ref 0.0–2.0)
Bicarbonate: 21.6 mEq/L (ref 20.0–24.0)
O2 Saturation: 89 %
PH ART: 7.444 (ref 7.350–7.450)
PO2 ART: 54 mmHg — AB (ref 80.0–100.0)
TCO2: 23 mmol/L (ref 0–100)
pCO2 arterial: 31.5 mmHg — ABNORMAL LOW (ref 35.0–45.0)

## 2015-10-08 LAB — URINALYSIS, ROUTINE W REFLEX MICROSCOPIC
Glucose, UA: NEGATIVE mg/dL
Hgb urine dipstick: NEGATIVE
KETONES UR: 15 mg/dL — AB
LEUKOCYTES UA: NEGATIVE
NITRITE: NEGATIVE
PROTEIN: 100 mg/dL — AB
Specific Gravity, Urine: 1.018 (ref 1.005–1.030)
pH: 6 (ref 5.0–8.0)

## 2015-10-08 LAB — I-STAT TROPONIN, ED: TROPONIN I, POC: 0 ng/mL (ref 0.00–0.08)

## 2015-10-08 LAB — STREP PNEUMONIAE URINARY ANTIGEN: Strep Pneumo Urinary Antigen: POSITIVE — AB

## 2015-10-08 LAB — MRSA PCR SCREENING: MRSA by PCR: NEGATIVE

## 2015-10-08 LAB — PROCALCITONIN
PROCALCITONIN: 43.94 ng/mL
PROCALCITONIN: 42.22 ng/mL

## 2015-10-08 LAB — URINE MICROSCOPIC-ADD ON: WBC UA: NONE SEEN WBC/hpf (ref 0–5)

## 2015-10-08 LAB — BRAIN NATRIURETIC PEPTIDE: B NATRIURETIC PEPTIDE 5: 124.5 pg/mL — AB (ref 0.0–100.0)

## 2015-10-08 LAB — TROPONIN I: Troponin I: <0.03 ng/mL (ref <0.031)

## 2015-10-08 LAB — D-DIMER, QUANTITATIVE: D-Dimer, Quant: 3.87 ug/mL-FEU — ABNORMAL HIGH (ref 0.00–0.50)

## 2015-10-08 MED ORDER — ARFORMOTEROL TARTRATE 15 MCG/2ML IN NEBU
15.0000 ug | INHALATION_SOLUTION | Freq: Two times a day (BID) | RESPIRATORY_TRACT | Status: DC
  Administered 2015-10-08 – 2015-10-15 (×13): 15 ug via RESPIRATORY_TRACT
  Filled 2015-10-08 (×14): qty 2

## 2015-10-08 MED ORDER — IOHEXOL 350 MG/ML SOLN
100.0000 mL | Freq: Once | INTRAVENOUS | Status: AC | PRN
  Administered 2015-10-08: 100 mL via INTRAVENOUS

## 2015-10-08 MED ORDER — PANTOPRAZOLE SODIUM 40 MG PO TBEC
40.0000 mg | DELAYED_RELEASE_TABLET | Freq: Every day | ORAL | Status: DC
  Administered 2015-10-08 – 2015-10-15 (×8): 40 mg via ORAL
  Filled 2015-10-08 (×8): qty 1

## 2015-10-08 MED ORDER — FENTANYL CITRATE (PF) 100 MCG/2ML IJ SOLN
50.0000 ug | Freq: Once | INTRAMUSCULAR | Status: AC
  Administered 2015-10-08: 50 ug via INTRAVENOUS
  Filled 2015-10-08: qty 2

## 2015-10-08 MED ORDER — ONDANSETRON HCL 4 MG/2ML IJ SOLN: 4.0000 mg | Freq: Four times a day (QID) | INTRAMUSCULAR | Status: DC | PRN

## 2015-10-08 MED ORDER — DEXTROSE 5 % IV SOLN
1.0000 g | INTRAVENOUS | Status: DC
  Filled 2015-10-08: qty 10

## 2015-10-08 MED ORDER — POTASSIUM CHLORIDE 10 MEQ/100ML IV SOLN
10.0000 meq | INTRAVENOUS | Status: AC
  Administered 2015-10-08 (×2): 10 meq via INTRAVENOUS
  Filled 2015-10-08 (×2): qty 100

## 2015-10-08 MED ORDER — BUDESONIDE 0.5 MG/2ML IN SUSP
0.2500 mg | Freq: Four times a day (QID) | RESPIRATORY_TRACT | Status: DC
  Administered 2015-10-08 (×2): 0.25 mg via RESPIRATORY_TRACT
  Filled 2015-10-08 (×2): qty 2

## 2015-10-08 MED ORDER — SODIUM CHLORIDE 0.9 % IV BOLUS (SEPSIS): 500.0000 mL | Freq: Once | INTRAVENOUS | Status: DC

## 2015-10-08 MED ORDER — ALBUTEROL SULFATE (2.5 MG/3ML) 0.083% IN NEBU: 2.5000 mg | INHALATION_SOLUTION | RESPIRATORY_TRACT | Status: DC | PRN

## 2015-10-08 MED ORDER — ACETAMINOPHEN 325 MG PO TABS
650.0000 mg | ORAL_TABLET | ORAL | Status: DC | PRN
  Administered 2015-10-08 – 2015-10-13 (×4): 650 mg via ORAL
  Filled 2015-10-08 (×4): qty 2

## 2015-10-08 MED ORDER — SODIUM CHLORIDE 0.9 % IV BOLUS (SEPSIS)
1000.0000 mL | Freq: Once | INTRAVENOUS | Status: AC
  Administered 2015-10-08: 1000 mL via INTRAVENOUS

## 2015-10-08 MED ORDER — SODIUM CHLORIDE 0.9 % IV SOLN
INTRAVENOUS | Status: DC
  Administered 2015-10-08 – 2015-10-14 (×8): via INTRAVENOUS

## 2015-10-08 MED ORDER — ENOXAPARIN SODIUM 40 MG/0.4ML ~~LOC~~ SOLN
40.0000 mg | SUBCUTANEOUS | Status: DC
  Administered 2015-10-08: 40 mg via SUBCUTANEOUS
  Filled 2015-10-08: qty 0.4

## 2015-10-08 MED ORDER — DEXTROSE 5 % IV SOLN
500.0000 mg | INTRAVENOUS | Status: DC
  Administered 2015-10-09: 500 mg via INTRAVENOUS
  Filled 2015-10-08: qty 500

## 2015-10-08 MED ORDER — AZITHROMYCIN 500 MG IV SOLR
500.0000 mg | Freq: Once | INTRAVENOUS | Status: AC
  Administered 2015-10-08: 500 mg via INTRAVENOUS
  Filled 2015-10-08: qty 500

## 2015-10-08 MED ORDER — SODIUM CHLORIDE 0.9 % IV BOLUS (SEPSIS): 250.0000 mL | Freq: Once | INTRAVENOUS | Status: DC

## 2015-10-08 MED ORDER — DEXTROSE 5 % IV SOLN
1.0000 g | Freq: Once | INTRAVENOUS | Status: AC
  Administered 2015-10-08: 1 g via INTRAVENOUS
  Filled 2015-10-08: qty 10

## 2015-10-08 NOTE — ED Notes (Signed)
Pt placed on the bedpan   Talking with family

## 2015-10-08 NOTE — Progress Notes (Signed)
Pharmacy Code Sepsis Protocol  Time of code sepsis page: 0950 [x]  Antibiotics delivered at 10:03 []  Antibiotics administered prior to code at (if checked, omit next 2 questions)  Were antibiotics ordered at the time of the code sepsis page? Yes Was it required to contact the physician? [x]  Physician not contacted []  Physician contacted to order antibiotics for code sepsis []  Physician contacted to recommend changing antibiotics  Pharmacy consulted for: Ceftriaxone/Zithromax  Anti-infectives    Start     Dose/Rate Route Frequency Ordered Stop   10/08/15 0945  cefTRIAXone (ROCEPHIN) 1 g in dextrose 5 % 50 mL IVPB     1 g 100 mL/hr over 30 Minutes Intravenous  Once 10/08/15 0944     10/08/15 0945  azithromycin (ZITHROMAX) 500 mg in dextrose 5 % 250 mL IVPB     500 mg 250 mL/hr over 60 Minutes Intravenous  Once 10/08/15 0944          Nurse education provided: []  Minutes left to administer antibiotics to achieve 1 hour goal [x]  Correct order of antibiotic administration []  Antibiotic Y-site compatibilities     Rober Minion, PharmD., MS Clinical Pharmacist Pager:  [phone] Thank you for allowing pharmacy to be part of this patients care team. 10/08/2015, 10:26 AM

## 2015-10-08 NOTE — ED Notes (Signed)
Pt returned from CT °

## 2015-10-08 NOTE — ED Notes (Signed)
Phlebotomy at the bedside  

## 2015-10-08 NOTE — Progress Notes (Signed)
ANTIBIOTIC CONSULT NOTE - INITIAL  Pharmacy Consult for Azithromax/Ceftriaxone Indication: sepsis  No Known Allergies  Patient Measurements: Height: 5\' 7"  (170.2 cm) Weight: 127 lb (57.607 kg) IBW/kg (Calculated) : 61.6  Vital Signs: Temp: 97.5 F (36.4 C) (02/05 0859) Temp Source: Oral (02/05 0859) BP: 130/84 mmHg (02/05 1005) Pulse Rate: 129 (02/05 1005)  Labs:  Recent Labs  10/08/15 0906  WBC 5.2  HGB 9.4*  PLT 366  CREATININE 0.88   Estimated Creatinine Clearance: 63.4 mL/min (by C-G formula based on Cr of 0.88).  Microbiology: No results found for this or any previous visit (from the past 720 hour(s)).  Medical History: Past Medical History  Diagnosis Date  . Asthma   . High cholesterol   . Hypertension   . GERD (gastroesophageal reflux disease)    Medications:  Anti-infectives    Start     Dose/Rate Route Frequency Ordered Stop   10/08/15 0945  cefTRIAXone (ROCEPHIN) 1 g in dextrose 5 % 50 mL IVPB     1 g 100 mL/hr over 30 Minutes Intravenous  Once 10/08/15 0944 10/08/15 1035   10/08/15 0945  azithromycin (ZITHROMAX) 500 mg in dextrose 5 % 250 mL IVPB     500 mg 250 mL/hr over 60 Minutes Intravenous  Once 10/08/15 0944       Assessment: 60yo female comes into the ED with c/o SOB with productive sputum since yesterday.  She is afebrile and her WBC is 5.2 but a lactic acid of 6.6.  We have been asked to dose her Ceftriaxone and Zithromax for r/o sepsis and possible CAP.  Goal of Therapy:  Therapeutic response to IV antibiotics  Plan:  - Code sepsis called with Ceftriaxone 1gm and Azithromycin 500mg  ordered x 1.  She has been started on the Ceftriaxone which will be followed by the Azithromycin. - Will continue Ceftriaxone 1gm daily - Continue Azithromycin 500mg  every 24 hours also - Monitor clinical response, culture data and LOT  Rober Minion, PharmD., MS Clinical Pharmacist Pager:  [phone] Thank you for allowing pharmacy to be part of  this patients care team. 10/08/2015,10:29 AM

## 2015-10-08 NOTE — ED Provider Notes (Signed)
CSN: [id number]     Arrival date & time 10/08/15  [id number] History   First MD Initiated Contact with Patient 10/08/15 [phone]     Chief Complaint  Patient presents with  . Chest Pain     (Consider location/radiation/quality/duration/timing/severity/associated sxs/prior Treatment) HPI Comments: Shortness of breath and cough of productive yellow mucus since yesterday.  Denies chest pain but does have some pleuritic L rib pain.  Does not smoke, has asthma, exposed to smoke at home. No heart history. No fever.  Cough productive of yellow mucus.  Level 5 caveat for respiratory distress on arrival.  Patient is a 60 y.o. female presenting with chest pain. The history is provided by a relative and the patient. The history is limited by the condition of the patient.  Chest Pain Associated symptoms: cough, fatigue and shortness of breath   Associated symptoms: no abdominal pain, no dizziness, no fever, no headache, no nausea, not vomiting and no weakness     Past Medical History  Diagnosis Date  . Asthma   . High cholesterol   . Hypertension   . GERD (gastroesophageal reflux disease)    Past Surgical History  Procedure Laterality Date  . Abdominal hysterectomy     No family history on file. Social History  Substance Use Topics  . Smoking status: Never Smoker   . Smokeless tobacco: None  . Alcohol Use: No   OB History    No data available     Review of Systems  Constitutional: Positive for activity change, appetite change and fatigue. Negative for fever.  HENT: Negative for congestion and rhinorrhea.   Eyes: Negative for visual disturbance.  Respiratory: Positive for cough, chest tightness and shortness of breath.   Cardiovascular: Positive for chest pain.  Gastrointestinal: Negative for nausea, vomiting and abdominal pain.  Genitourinary: Negative for dysuria, hematuria, vaginal bleeding and vaginal discharge.  Musculoskeletal: Negative for myalgias and arthralgias.  Neurological:  Negative for dizziness, weakness and headaches.  A complete 10 system review of systems was obtained and all systems are negative except as noted in the HPI and PMH.      Allergies  Review of patient's allergies indicates no known allergies.  Home Medications   Prior to Admission medications   Medication Sig Start Date End Date Taking? Authorizing Provider  albuterol (PROVENTIL HFA;VENTOLIN HFA) 108 (90 BASE) MCG/ACT inhaler Inhale 1-2 puffs into the lungs every 6 (six) hours as needed for wheezing. 07/24/15  Yes Lance Bosch, NP  atorvastatin (LIPITOR) 10 MG tablet Take 1 tablet (10 mg total) by mouth daily at 6 PM. For cholesterol 07/24/15  Yes Lance Bosch, NP  cetirizine (ZYRTEC) 10 MG tablet Take 1 tablet (10 mg total) by mouth daily. 07/24/15  Yes Lance Bosch, NP  fluticasone (FLONASE) 50 MCG/ACT nasal spray Place 2 sprays into both nostrils daily. 05/23/15  Yes Lance Bosch, NP  Fluticasone-Salmeterol (ADVAIR) 500-50 MCG/DOSE AEPB Inhale 1 puff into the lungs every 12 (twelve) hours. 07/24/15  Yes Lance Bosch, NP  hydrochlorothiazide (HYDRODIURIL) 25 MG tablet Take 0.5 tablets (12.5 mg total) by mouth daily. For blood pressure 07/24/15  Yes Lance Bosch, NP  irbesartan (AVAPRO) 150 MG tablet Take 1 tablet (150 mg total) by mouth daily after breakfast. For blood pressure 07/24/15  Yes Lance Bosch, NP  montelukast (SINGULAIR) 10 MG tablet Take 1 tablet (10 mg total) by mouth at bedtime. 07/24/15  Yes Lance Bosch, NP  pantoprazole (PROTONIX) 20 MG tablet  Take 1 tablet (20 mg total) by mouth daily. 07/24/15  Yes Lance Bosch, NP  predniSONE (DELTASONE) 20 MG tablet Take 1 tablet (20 mg total) by mouth daily with breakfast. 07/24/15  Yes Lance Bosch, NP  traZODone (DESYREL) 50 MG tablet Take 1 tablet (50 mg total) by mouth at bedtime. 07/24/15  Yes Lance Bosch, NP   BP 151/92 mmHg  Pulse 145  Temp(Src) 97.5 F (36.4 C) (Oral)  Resp 36  Ht 5\' 7"  (1.702 m)   Wt 127 lb (57.607 kg)  BMI 19.89 kg/m2  SpO2 94% Physical Exam  Constitutional: She is oriented to person, place, and time. She appears well-developed and well-nourished. She appears distressed.  Tachypneic, speaking in short phrases disheveled  HENT:  Head: Normocephalic and atraumatic.  Mouth/Throat: Oropharynx is clear and moist. No oropharyngeal exudate.  Dry mucus membranes Poor dentition  Eyes: Conjunctivae and EOM are normal. Pupils are equal, round, and reactive to light.  Neck: Normal range of motion. Neck supple.  No meningismus.  Cardiovascular: Normal rate, normal heart sounds and intact distal pulses.   No murmur heard. Tachycardic to 130s  Pulmonary/Chest: Breath sounds normal. She is in respiratory distress.  Decreased breath sounds at L base with shallow respirations  Abdominal: Soft. There is no tenderness. There is no rebound and no guarding.  Musculoskeletal: Normal range of motion. She exhibits no edema or tenderness.  Neurological: She is alert and oriented to person, place, and time. No cranial nerve deficit. She exhibits normal muscle tone. Coordination normal.  No ataxia on finger to nose bilaterally. No pronator drift. 5/5 strength throughout. CN 2-12 intact.Equal grip strength. Sensation intact.   Skin: Skin is warm.  Psychiatric: She has a normal mood and affect. Her behavior is normal.  Nursing note and vitals reviewed.   ED Course  Procedures (including critical care time) Labs Review Labs Reviewed  CBC WITH DIFFERENTIAL/PLATELET - Abnormal; Notable for the following:    RBC 3.55 (*)    Hemoglobin 9.4 (*)    HCT 28.6 (*)    RDW 23.1 (*)    Lymphs Abs 0.4 (*)    All other components within normal limits  COMPREHENSIVE METABOLIC PANEL - Abnormal; Notable for the following:    Sodium 133 (*)    Potassium 2.7 (*)    Chloride 91 (*)    Albumin 2.3 (*)    AST 405 (*)    Anion gap 20 (*)    All other components within normal limits  D-DIMER,  QUANTITATIVE (NOT AT [facility]) - Abnormal; Notable for the following:    D-Dimer, Quant 3.87 (*)    All other components within normal limits  BRAIN NATRIURETIC PEPTIDE - Abnormal; Notable for the following:    B Natriuretic Peptide 124.5 (*)    All other components within normal limits  URINALYSIS, ROUTINE W REFLEX MICROSCOPIC (NOT AT [facility]) - Abnormal; Notable for the following:    Color, Urine AMBER (*)    APPearance CLOUDY (*)    Bilirubin Urine SMALL (*)    Ketones, ur 15 (*)    Protein, ur 100 (*)    All other components within normal limits  URINE MICROSCOPIC-ADD ON - Abnormal; Notable for the following:    Squamous Epithelial / LPF 6-30 (*)    Bacteria, UA RARE (*)    Casts HYALINE CASTS (*)    All other components within normal limits  STREP PNEUMONIAE URINARY ANTIGEN - Abnormal; Notable for the following:  Strep Pneumo Urinary Antigen POSITIVE (*)    All other components within normal limits  I-STAT CG4 LACTIC ACID, ED - Abnormal; Notable for the following:    Lactic Acid, Venous 6.65 (*)    All other components within normal limits  I-STAT ARTERIAL BLOOD GAS, ED - Abnormal; Notable for the following:    pCO2 arterial 31.5 (*)    pO2, Arterial 54.0 (*)    All other components within normal limits  I-STAT CG4 LACTIC ACID, ED - Abnormal; Notable for the following:    Lactic Acid, Venous 6.22 (*)    All other components within normal limits  I-STAT CG4 LACTIC ACID, ED - Abnormal; Notable for the following:    Lactic Acid, Venous 4.29 (*)    All other components within normal limits  CULTURE, BLOOD (ROUTINE X 2)  CULTURE, BLOOD (ROUTINE X 2)  URINE CULTURE  RESPIRATORY VIRUS PANEL  TROPONIN I  PROCALCITONIN  PROCALCITONIN  LEGIONELLA ANTIGEN, URINE  CBC  COMPREHENSIVE METABOLIC PANEL  LACTIC ACID, PLASMA  LACTIC ACID, PLASMA  I-STAT TROPOININ, ED  I-STAT CG4 LACTIC ACID, ED    Imaging Review Ct Angio Chest Pe W/cm &/or Wo Cm  10/08/2015  CLINICAL DATA:  Acute  shortness of breath. EXAM: CT ANGIOGRAPHY CHEST WITH CONTRAST TECHNIQUE: Multidetector CT imaging of the chest was performed using the standard protocol during bolus administration of intravenous contrast. Multiplanar CT image reconstructions and MIPs were obtained to evaluate the vascular anatomy. CONTRAST:  139mL OMNIPAQUE IOHEXOL 350 MG/ML SOLN COMPARISON:  Chest radiograph of same day. FINDINGS: No pneumothorax is noted. Small amount of fluid is noted in the superior portion of the left major fissure. There is nearly complete opacification of the left lower lobe due to pneumonia. Complete obstruction of the left lower lobe bronchus is noted most likely due to mucous plug or inflammatory material. Large right lower lobe pneumonia is noted as well. There is no evidence of pulmonary embolus. There is no evidence of thoracic aortic dissection or aneurysm. Coronary artery calcifications are noted. Visualized portion of upper abdomen is unremarkable. No significant osseous abnormality is noted. Probable lipoma is noted in left posterior chest. Review of the MIP images confirms the above findings. IMPRESSION: No evidence of pulmonary embolus. Complete opacification of the left lower lobe due to pneumonia. Complete obstruction of the left lower lobe bronchus is noted most likely due to mucous plug or inflammatory material. Large right lower lobe pneumonia is noted as well. Probable lipoma seen in left posterior chest. Electronically Signed   By: Marijo Conception, M.D.   On: 10/08/2015 14:32   Dg Chest Portable 1 View  10/08/2015  CLINICAL DATA:  60 year old female with shortness of breath since last night. Initial encounter. EXAM: PORTABLE CHEST 1 VIEW COMPARISON:  Chest radiographs 07/10/2015 and earlier. FINDINGS: Portable AP semi upright view at 0934 hours. New moderate size left pleural effusion with dense opacification of the left lung base. Visible mediastinal contours are stable. The right lung appears stable  and clear. No superimposed pneumothorax or pulmonary edema. No acute osseous abnormality identified. IMPRESSION: New moderate size left pleural effusion with dense left lung base collapse or consolidation. Electronically Signed   By: Genevie Ann M.D.   On: 10/08/2015 10:32   I have personally reviewed and evaluated these images and lab results as part of my medical decision-making.   EKG Interpretation   Date/Time:  Sunday October 08 2015 09:01:01 EST Ventricular Rate:  132 PR Interval:  116  QRS Duration: 80 QT Interval:  312 QTC Calculation: 462 R Axis:   78 Text Interpretation:  Sinus tachycardia Possible Left atrial enlargement  Borderline ECG No significant change was found Confirmed by Wyvonnia Dusky  MD,  Keidrick Murty [phone]) on 10/08/2015 9:35:37 AM      MDM   Final diagnoses:  Sepsis, due to unspecified organism (Joice)  CAP (community acquired pneumonia)   Patient with chest pain and shortness of breath onset last night. She is tachycardic, tachypneic, Hypoxic. Diminished Breath Sounds at the Left Base.  X-ray shows large infiltrate with effusion. Treated for community acquired pneumonia. Code sepsis activated.  Patient given IV fluids, IV antibiotics. Cultures obtained. Lactate is 6.6.  Discussed with Dr. Nelda Marseille critical care. She has no CO2 retention on her ABG. He feels she can go to stepdown if her lactate is improving with fluids.  Repeat lactate still 6 after 2 Liters of fluid.  HR 130-145s.  RR 30-40s.  No CO2 retention on ABG, BP stable.  Mental status stable. Large O2 requirement and switched to NRB.  D/w DR. Nelda Marseille who will admit to ICU.  CT negative for PE but does show Large LLL pneumonia with likely mucus plugging.  She is comfortable on NRB and mentating well.  Hold on intubation at this time.   CRITICAL CARE Performed by: Ezequiel Essex Total critical care time: 60 minutes Critical care time was exclusive of separately billable procedures and treating other  patients. Critical care was necessary to treat or prevent imminent or life-threatening deterioration. Critical care was time spent personally by me on the following activities: development of treatment plan with patient and/or surrogate as well as nursing, discussions with consultants, evaluation of patient's response to treatment, examination of patient, obtaining history from patient or surrogate, ordering and performing treatments and interventions, ordering and review of laboratory studies, ordering and review of radiographic studies, pulse oximetry and re-evaluation of patient's condition.   Ezequiel Essex, MD 10/08/15 1739

## 2015-10-08 NOTE — ED Notes (Signed)
theb pt just received a hhn  Alert  She appears comfortable

## 2015-10-08 NOTE — ED Notes (Signed)
Paged RT for breathing treatment and RVP.

## 2015-10-08 NOTE — ED Notes (Signed)
The pt feels a little better.  resp swab collected then to lab

## 2015-10-08 NOTE — ED Notes (Signed)
Admitting at the bedside.  

## 2015-10-08 NOTE — ED Notes (Signed)
Xray at the bedside.

## 2015-10-08 NOTE — Progress Notes (Signed)
CRITICAL VALUE ALERT  Critical value received:  (+ blood cultures) GRAM POS COCCI in pairs and chains in both sets of aerobic bottles.   Date of notification:  10/08/15  Time of notification:  2030  Critical value read back: Yes  Nurse who received alert:  Remo Lipps, RN  MD notified (1st page):  Called PCCM @ 2033 and spoke with Dr. Corinna Lines

## 2015-10-08 NOTE — ED Notes (Signed)
Pt reports SOB and L sided chest pain, onset Saturday evening. Denies chest pain upon arrival, reports "I feel like I can't catch my breath." respirations labored upon arrival to ED.

## 2015-10-08 NOTE — H&P (Signed)
[facility]ICINE   Name: Angel Hebert MRN: [id number] DOB: 1956/06/19    ADMISSION DATE:  10/08/2015  REFERRING MD:  EDP   CHIEF COMPLAINT:  LLL PNA/Sepsis   HISTORY OF PRESENT ILLNESS:   60 yo female never smoker with asthma presented to ER on 10/08/15 with a 2 day hx of cough, congestion , feverish, chills and sob . CXR showed a  large LLL PNA and effusion, elevated lactate ~6 . She had tachypnea and was hypoxic . Improved with NRB.  She was given IVF bolus with repeat lactate not showing significant improvement. WBC nml . ABG showed normal pH and no Co2 retention. She is talking comfortable with NRB on in room w/ normal b/p. No active wheezing. She did not receive flu shot. She has very poor dentition.  Says she has been compliant with Advair. Had friend that was sick recently that she was exposed to . She denies chest pain, orthopnea, leg swelling or hemoptysis .   PAST MEDICAL HISTORY :  She  has a past medical history of Asthma; High cholesterol; Hypertension; and GERD (gastroesophageal reflux disease).  PAST SURGICAL HISTORY: She  has past surgical history that includes Abdominal hysterectomy.  No Known Allergies  No current facility-administered medications on file prior to encounter.   Current Outpatient Prescriptions on File Prior to Encounter  Medication Sig  . albuterol (PROVENTIL HFA;VENTOLIN HFA) 108 (90 BASE) MCG/ACT inhaler Inhale 1-2 puffs into the lungs every 6 (six) hours as needed for wheezing.  [facility] atorvastatin (LIPITOR) 10 MG tablet Take 1 tablet (10 mg total) by mouth daily at 6 PM. For cholesterol  . cetirizine (ZYRTEC) 10 MG tablet Take 1 tablet (10 mg total) by mouth daily.  . fluticasone (FLONASE) 50 MCG/ACT nasal spray Place 2 sprays into both nostrils daily.  . Fluticasone-Salmeterol (ADVAIR) 500-50 MCG/DOSE AEPB Inhale 1 puff into the lungs every 12 (twelve) hours.  . hydrochlorothiazide (HYDRODIURIL) 25 MG tablet Take 0.5 tablets (12.5 mg  total) by mouth daily. For blood pressure  . irbesartan (AVAPRO) 150 MG tablet Take 1 tablet (150 mg total) by mouth daily after breakfast. For blood pressure  . montelukast (SINGULAIR) 10 MG tablet Take 1 tablet (10 mg total) by mouth at bedtime.  . pantoprazole (PROTONIX) 20 MG tablet Take 1 tablet (20 mg total) by mouth daily.  . predniSONE (DELTASONE) 20 MG tablet Take 1 tablet (20 mg total) by mouth daily with breakfast.  . traZODone (DESYREL) 50 MG tablet Take 1 tablet (50 mg total) by mouth at bedtime.  . [DISCONTINUED] loratadine (CLARITIN) 10 MG tablet Take 10 mg by mouth daily.    FAMILY HISTORY:  Her has no family status information on file.   SOCIAL HISTORY: She  reports that she has never smoked. She does not have any smokeless tobacco history on file. She reports that she does not drink alcohol or use illicit drugs.  REVIEW OF SYSTEMS:   Constitutional:   No  weight loss, night sweats,   +Fevers, chills, fatigue, or  lassitude.  HEENT:   No headaches,  Difficulty swallowing,  Tooth/dental problems, or  Sore throat,                No sneezing, itching, ear ache,  +nasal congestion, post nasal drip,   CV:  No chest pain,  Orthopnea, PND, swelling in lower extremities, anasarca, dizziness, palpitations, syncope.   GI  No heartburn, indigestion, abdominal pain, nausea, vomiting, diarrhea, change in bowel habits,  loss of appetite, bloody stools.   Resp: +shortness of breath with exertion or at rest.  +excess mucus, no productive cough,  + non-productive cough,  No coughing up of blood.  [facility]  No chest wall deformity  Skin: no rash or lesions.  GU: no dysuria, change in color of urine, no urgency or frequency.  No flank pain, no hematuria   MS:  No joint pain or swelling.  No decreased range of motion.  No back pain.  Psych:  No change in mood or affect. No depression or anxiety.  No memory loss.       SUBJECTIVE:  She says she is more comfortable on the oxygen    VITAL SIGNS: BP 140/82 mmHg  Pulse 132  Temp(Src) 97.5 F (36.4 C) (Oral)  Resp 29  Ht 5\' 7"  (1.702 m)  Wt 127 lb (57.607 kg)  BMI 19.89 kg/m2  SpO2 100%  HEMODYNAMICS:    VENTILATOR SETTINGS:    INTAKE / OUTPUT:    PHYSICAL EXAMINATION: General: frail with mild resp distress on NRB  Neuro:  A/Ox 3 , no focal deficits noted.  HEENT: very poor dentition Cardiovascular:  Tachy , no m/r/g Lungs: corse crackles on left  Abdomen:  Soft NT ,BS + Musculoskeletal:  Intact  Skin:  Intact w/ no rash   LABS:  BMET  Recent Labs Lab 10/08/15 0906  NA 133*  K 2.7*  CL 91*  CO2 22  BUN 19  CREATININE 0.88  GLUCOSE 75    Electrolytes  Recent Labs Lab 10/08/15 0906  CALCIUM 9.0    CBC  Recent Labs Lab 10/08/15 0906  WBC 5.2  HGB 9.4*  HCT 28.6*  PLT 366    Coag's No results for input(s): APTT, INR in the last 168 hours.  Sepsis Markers  Recent Labs Lab 10/08/15 0936 10/08/15 1151  LATICACIDVEN 6.65* 6.22*    ABG  Recent Labs Lab 10/08/15 1012  PHART 7.444  PCO2ART 31.5*  PO2ART 54.0*    Liver Enzymes  Recent Labs Lab 10/08/15 0906  AST 405*  ALT 54  ALKPHOS 64  BILITOT 1.2  ALBUMIN 2.3*    Cardiac Enzymes  Recent Labs Lab 10/08/15 0939  TROPONINI <0.03    Glucose No results for input(s): GLUCAP in the last 168 hours.  Imaging Dg Chest Portable 1 View  10/08/2015  CLINICAL DATA:  60 year old female with shortness of breath since last night. Initial encounter. EXAM: PORTABLE CHEST 1 VIEW COMPARISON:  Chest radiographs 07/10/2015 and earlier. FINDINGS: Portable AP semi upright view at 0934 hours. New moderate size left pleural effusion with dense opacification of the left lung base. Visible mediastinal contours are stable. The right lung appears stable and clear. No superimposed pneumothorax or pulmonary edema. No acute osseous abnormality identified. IMPRESSION: New moderate size left pleural effusion with dense left lung  base collapse or consolidation. Electronically Signed   By: Genevie Ann M.D.   On: 10/08/2015 10:32     STUDIES:   CULTURES: 2/5 BC x 2 >> 21/5 UC >>   ANTIBIOTICS: 2/5 Zithromax >> 2/5 Rocephin>>  SIGNIFICANT EVENTS:   LINES/TUBES:   DISCUSSION: 60 yo female never smoker with asthma admitted 2/5 with LLL CAP /effusion with SIRS presentation . Admit to SDU on IV abx and pulmonary hygiene  Follow lactate clearance.    ASSESSMENT / PLAN:  PULMONARY A: LLL CAP with left effusion  Asthma  P:   Admit to SDU  Cont  IV abx,-see ID  sxn  Change Advair to Brovana/Pulmicort  Albuterol Neb As needed   O2 to keep sat >90-92% Pulmonary hygiene with IS  Check viral panel  Check strep/Leg urin ag  CARDIOVASCULAR A: Tachycardia most likely from SIRS /PNA Troponin neg  HTN -b/p ok  Elevated Lactate  P:  Continue IVF at 100cc /hr  Hold Avapro and HCTZ  Tr lactate     RENAL A:   Hypokalemia   P:   Replace K+ in ER  Tr bmet  Replace electrolytes as indicated   GASTROINTESTINAL A:   GERD -on home PPI  Transaminitis  ? Secondary to infection vs Etoh P:   Heart healthy diet  PPI  Tr LFT   HEMATOLOGIC A:   Anemia  P:  Tr cbc  Lovenox DVT proph  INFECTIOUS A:  LLL PNA -CAP   P:   Cont Azith/Rocephin  Check viral panel  Follow cx data   ENDOCRINE A:     No acute issues  P:   Follow glucose on bmet   NEUROLOGIC A:  No apparent acute issues   P:   RASS goal: n/a Monitor     FAMILY  - Updates:   - Inter-disciplinary family meet or Palliative Care meeting due by:  day 7   Tammy Parrett NP-C  Pulmonary and [place] Pager: [phone]  10/08/2015, 1:22 PM  Attending Note:  60 year old female with PMH above presenting with hypoxemic respiratory failure requiring 100% NRB and lactic acidosis.  The patient responded nicely to IVF but failed to clear lactate.  Also severe hypoxemic with dramatic involvement  of the left lung.  There is also a moderate pleural effusion and obstruction of the left mainstem bronchus.  In a perfect world I would like to bronch the patient to evaluate the bronchial obstruction.  However, with level of hypoxemia she will need to be intubated first.  Therefore, will allow for a few days of abx, use flutter valve and IS in an attempt to clear what radiology believe is a mucous plug then repeat the CT without contrast.  If obstruction is still there then will need a bronchoscopy.  Will treat as CAP for now and check PCT protocol.  Will also check a lactic acid level in AM.  Given that she appears comfortable on 100% NRB and that BP has improved with IVF will admit to SDU and give to [facility] for AM with PCCM staying as consult.  The patient is critically ill with multiple organ systems failure and requires high complexity decision making for assessment and support, frequent evaluation and titration of therapies, application of advanced monitoring technologies and extensive interpretation of multiple databases.   Critical Care Time devoted to patient care services described in this note is  35  Minutes. This time reflects time of care of this signee Dr Jennet Maduro. This critical care time does not reflect procedure time, or teaching time or supervisory time of PA/NP/Med student/Med Resident etc but could involve care discussion time.  Rush Farmer, M.D. [facility] Pulmonary/Critical Care Medicine. Pager: [phone]. After hours pager: [phone].

## 2015-10-08 NOTE — ED Notes (Signed)
Report called to 1600

## 2015-10-08 NOTE — Progress Notes (Signed)
[facility] Progress Note Patient Name: Angel Hebert DOB: 02-16-1956 MRN: [id number]   Date of Service  10/08/2015  HPI/Events of Note  Blood Cultures are positive for GPC's in pairs and chains. Patient is currently on Rocephin and Azithromycin. Both antibiotics should cover GPC's in pairs and chains.   eICU Interventions  Continue present management.     Intervention Category Major Interventions: Infection - evaluation and management  Sommer,Steven Eugene 10/08/2015, 8:37 PM

## 2015-10-09 ENCOUNTER — Ambulatory Visit: Payer: Self-pay

## 2015-10-09 ENCOUNTER — Inpatient Hospital Stay (HOSPITAL_COMMUNITY): Payer: Self-pay

## 2015-10-09 DIAGNOSIS — R6520 Severe sepsis without septic shock: Secondary | ICD-10-CM

## 2015-10-09 DIAGNOSIS — A419 Sepsis, unspecified organism: Secondary | ICD-10-CM

## 2015-10-09 LAB — LACTIC ACID, PLASMA: LACTIC ACID, VENOUS: 3.2 mmol/L — AB (ref 0.5–2.0)

## 2015-10-09 LAB — LEGIONELLA ANTIGEN, URINE

## 2015-10-09 LAB — URINE CULTURE

## 2015-10-09 LAB — COMPREHENSIVE METABOLIC PANEL
ALBUMIN: 1.7 g/dL — AB (ref 3.5–5.0)
ALT: 36 U/L (ref 14–54)
ANION GAP: 15 (ref 5–15)
AST: 213 U/L — AB (ref 15–41)
Alkaline Phosphatase: 52 U/L (ref 38–126)
BILIRUBIN TOTAL: 1.4 mg/dL — AB (ref 0.3–1.2)
BUN: 10 mg/dL (ref 6–20)
CHLORIDE: 100 mmol/L — AB (ref 101–111)
CO2: 22 mmol/L (ref 22–32)
CREATININE: 0.78 mg/dL (ref 0.44–1.00)
Calcium: 7.8 mg/dL — ABNORMAL LOW (ref 8.9–10.3)
GFR calc Af Amer: >60 mL/min (ref >60)
GFR calc non Af Amer: >60 mL/min (ref >60)
Glucose, Bld: 145 mg/dL — ABNORMAL HIGH (ref 65–99)
POTASSIUM: 2.5 mmol/L — AB (ref 3.5–5.1)
SODIUM: 137 mmol/L (ref 135–145)
Total Protein: 5.6 g/dL — ABNORMAL LOW (ref 6.5–8.1)

## 2015-10-09 LAB — CBC
HCT: 23.9 % — ABNORMAL LOW (ref 36.0–46.0)
Hemoglobin: 7.9 g/dL — ABNORMAL LOW (ref 12.0–15.0)
MCH: 26.4 pg (ref 26.0–34.0)
MCHC: 33.1 g/dL (ref 30.0–36.0)
MCV: 79.9 fL (ref 78.0–100.0)
PLATELETS: 396 10*3/uL (ref 150–400)
RBC: 2.99 MIL/uL — ABNORMAL LOW (ref 3.87–5.11)
RDW: 22.8 % — AB (ref 11.5–15.5)
WBC: 17.1 10*3/uL — AB (ref 4.0–10.5)

## 2015-10-09 LAB — INFLUENZA PANEL BY PCR (TYPE A & B)
H1N1 flu by pcr: NOT DETECTED
Influenza A By PCR: NEGATIVE
Influenza B By PCR: NEGATIVE

## 2015-10-09 MED ORDER — CETYLPYRIDINIUM CHLORIDE 0.05 % MT LIQD
7.0000 mL | Freq: Two times a day (BID) | OROMUCOSAL | Status: DC
  Administered 2015-10-09 – 2015-10-15 (×13): 7 mL via OROMUCOSAL

## 2015-10-09 MED ORDER — ENOXAPARIN SODIUM 40 MG/0.4ML ~~LOC~~ SOLN
40.0000 mg | SUBCUTANEOUS | Status: DC
  Administered 2015-10-09 – 2015-10-14 (×6): 40 mg via SUBCUTANEOUS
  Filled 2015-10-09 (×7): qty 0.4

## 2015-10-09 MED ORDER — POTASSIUM CHLORIDE CRYS ER 20 MEQ PO TBCR
40.0000 meq | EXTENDED_RELEASE_TABLET | Freq: Once | ORAL | Status: AC
  Administered 2015-10-09: 40 meq via ORAL
  Filled 2015-10-09: qty 2

## 2015-10-09 MED ORDER — BUDESONIDE 0.5 MG/2ML IN SUSP
0.2500 mg | Freq: Two times a day (BID) | RESPIRATORY_TRACT | Status: DC
  Filled 2015-10-09 (×2): qty 2

## 2015-10-09 MED ORDER — BENZONATATE 100 MG PO CAPS
200.0000 mg | ORAL_CAPSULE | Freq: Three times a day (TID) | ORAL | Status: DC | PRN
  Administered 2015-10-09 – 2015-10-15 (×6): 200 mg via ORAL
  Filled 2015-10-09 (×6): qty 2

## 2015-10-09 MED ORDER — LEVALBUTEROL HCL 0.63 MG/3ML IN NEBU: 0.6300 mg | INHALATION_SOLUTION | RESPIRATORY_TRACT | Status: DC | PRN

## 2015-10-09 MED ORDER — DEXTROSE 5 % IV SOLN
2.0000 g | INTRAVENOUS | Status: DC
  Administered 2015-10-09 – 2015-10-14 (×6): 2 g via INTRAVENOUS
  Filled 2015-10-09 (×6): qty 2

## 2015-10-09 MED ORDER — DM-GUAIFENESIN ER 30-600 MG PO TB12
1.0000 | ORAL_TABLET | Freq: Two times a day (BID) | ORAL | Status: DC
  Administered 2015-10-09 – 2015-10-12 (×6): 1 via ORAL
  Filled 2015-10-09 (×6): qty 1

## 2015-10-09 MED ORDER — PNEUMOCOCCAL VAC POLYVALENT 25 MCG/0.5ML IJ INJ
0.5000 mL | INJECTION | INTRAMUSCULAR | Status: AC
  Administered 2015-10-10: 0.5 mL via INTRAMUSCULAR
  Filled 2015-10-09: qty 0.5

## 2015-10-09 MED ORDER — BUDESONIDE 0.25 MG/2ML IN SUSP
0.2500 mg | Freq: Two times a day (BID) | RESPIRATORY_TRACT | Status: DC
  Administered 2015-10-09 – 2015-10-15 (×12): 0.25 mg via RESPIRATORY_TRACT
  Filled 2015-10-09 (×12): qty 2

## 2015-10-09 MED ORDER — INFLUENZA VAC SPLIT QUAD 0.5 ML IM SUSY
0.5000 mL | PREFILLED_SYRINGE | INTRAMUSCULAR | Status: AC
Start: 2015-10-10 — End: 2015-10-10
  Administered 2015-10-10: 0.5 mL via INTRAMUSCULAR
  Filled 2015-10-09: qty 0.5

## 2015-10-09 NOTE — Progress Notes (Signed)
[place] TEAM 1 - Stepdown/ICU TEAM PROGRESS NOTE  Angel Hebert [id number] DOB: 30-Nov-1955 DOA: 10/08/2015 PCP: Lance Bosch, NP  Admit HPI / Brief Narrative: 60 yo female never smoker with asthma presented to ER on 10/08/15 with a 2 day hx of cough, congestion, fever/chills and sob . CXR showed a large LLL PNA and effusion, elevated lactate ~6 . She had tachypnea and was hypoxic . Improved with NRB. She was given IVF bolus with repeat lactate not showing significant improvement. WBC nml . ABG showed normal pH and no Co2 retention.   HPI/Subjective: The patient states she is feeling better overall.  She does complain of persisting hectic cough.  She denies body aches fevers chills nausea vomiting or chest pain.  Assessment/Plan:  Severe Sepsis due to Strep pneumo LLL > RLL CAP with left effusion  Lactate is trending down - cont to hydrate - follow effusion on serial CXR w/ hydration ongoing   Bacteremia 2/2 blood cx  Likely Strep pneumo - cont abx coverage and follow   Sinus Tachycardia  Due to above - hydrate and follow   Hypokalemia  Due to poor intake/malnutrition - check Mg - follow   Asthma  Well compensated at present   HTN  BP controlled at this time - follow trend   GERD on home PPI   Transaminitis Likely shock liver v/s direct irritation from RLL PNA - follow w/ hydration   Anemia  Likely due to chronic poor nutrition - follow   Code Status: FULL Family Communication: spoke w/ sister at bedside  Disposition Plan: SDU  Consultants: PCCM  Procedures: none  Antibiotics: Azithromycin 2/5 > Rocephin 2/5 >  DVT prophylaxis: lovenox   Objective: Blood pressure 127/81, pulse 115, temperature 98.1 F (36.7 C), temperature source Oral, resp. rate 33, height 5\' 7"  (1.702 m), weight 51.4 kg (113 lb 5.1 oz), SpO2 100 %.  Intake/Output Summary (Last 24 hours) at 10/09/15 1341 Last data filed at 10/09/15 1004  Gross per 24 hour  Intake   2975 ml    Output   1350 ml  Net   1625 ml   Exam: General: No acute respiratory distress at rest in bed  Lungs: coarse B basilar crackles - no wheeze  Cardiovascular: tachycardic but regular - no gallup or rub  Abdomen: Nontender, nondistended, soft, bowel sounds positive, no rebound, no ascites, no appreciable mass Extremities: No significant cyanosis, clubbing, or edema bilateral lower extremities  Data Reviewed:  Basic Metabolic Panel:  Recent Labs Lab 10/08/15 0906 10/09/15 0504  NA 133* 137  K 2.7* 2.5*  CL 91* 100*  CO2 22 22  GLUCOSE 75 145*  BUN 19 10  CREATININE 0.88 0.78  CALCIUM 9.0 7.8*    CBC:  Recent Labs Lab 10/08/15 0906 10/09/15 0504  WBC 5.2 17.1*  NEUTROABS 4.7  --   HGB 9.4* 7.9*  HCT 28.6* 23.9*  MCV 80.6 79.9  PLT 366 396   Liver Function Tests:  Recent Labs Lab 10/08/15 0906 10/09/15 0504  AST 405* 213*  ALT 54 36  ALKPHOS 64 52  BILITOT 1.2 1.4*  PROT 7.2 5.6*  ALBUMIN 2.3* 1.7*   Cardiac Enzymes:  Recent Labs Lab 10/08/15 0939  TROPONINI <0.03    Recent Results (from the past 240 hour(s))  Blood Culture (routine x 2)     Status: None (Preliminary result)   Collection Time: 10/08/15  9:57 AM  Result Value Ref Range Status   Specimen Description BLOOD RIGHT  HAND  Final   Special Requests BOTTLES DRAWN AEROBIC ONLY 5CCS  Final   Culture  Setup Time   Final    AEROBIC BOTTLE ONLY GRAM POSITIVE COCCI IN PAIRS IN CHAINS CRITICAL RESULT CALLED TO, READ BACK BY AND VERIFIED WITH: [facility] 2033 10/08/15 M.CAMPBELL    Culture CULTURE REINCUBATED FOR BETTER GROWTH  Final   Report Status PENDING  Incomplete  Blood Culture (routine x 2)     Status: None (Preliminary result)   Collection Time: 10/08/15 10:00 AM  Result Value Ref Range Status   Specimen Description BLOOD LEFT FOREARM  Final   Special Requests BOTTLES DRAWN AEROBIC ONLY 5CCS  Final   Culture  Setup Time   Final    AEROBIC BOTTLE ONLY GRAM POSITIVE COCCI IN PAIRS IN  CHAINS CRITICAL RESULT CALLED TO, READ BACK BY AND VERIFIED WITH: [facility] 2033 10/08/15 M.CAMPBELL    Culture CULTURE REINCUBATED FOR BETTER GROWTH  Final   Report Status PENDING  Incomplete  MRSA PCR Screening     Status: None   Collection Time: 10/08/15  7:17 PM  Result Value Ref Range Status   MRSA by PCR NEGATIVE NEGATIVE Final    Comment:        The GeneXpert MRSA Assay (FDA approved for NASAL specimens only), is one component of a comprehensive MRSA colonization surveillance program. It is not intended to diagnose MRSA infection nor to guide or monitor treatment for MRSA infections.      Studies:   Recent x-ray studies have been reviewed in detail by the Attending Physician  Scheduled Meds:  Scheduled Meds: . antiseptic oral rinse  7 mL Mouth Rinse BID  . arformoterol  15 mcg Nebulization BID  . azithromycin  500 mg Intravenous Q24H  . budesonide (PULMICORT) nebulizer solution  0.25 mg Nebulization BID  . cefTRIAXone (ROCEPHIN)  IV  2 g Intravenous Q24H  . enoxaparin (LOVENOX) injection  40 mg Subcutaneous Q24H  . [START ON 10/10/2015] Influenza vac split quadrivalent PF  0.5 mL Intramuscular Tomorrow-1000  . pantoprazole  40 mg Oral Daily  . [START ON 10/10/2015] pneumococcal 23 valent vaccine  0.5 mL Intramuscular Tomorrow-1000    Time spent on care of this patient: 35 mins   Christain Niznik T , MD   [facility] Office  [phone] Pager - Text Page per Shea Evans as per below:  On-Call/Text Page:      [email or web address]      password [email or web address]  If 7PM-7AM, please contact night-coverage [email or web address] Password [email or web address] 10/09/2015, 1:41 PM   LOS: 1 day

## 2015-10-09 NOTE — Progress Notes (Signed)
CRITICAL VALUE ALERT  Critical value received:  Potassium 2.5 and lactic acid 3.2   Date of notification:  10/09/15  Time of notification:  0658  Critical value read back: Yes  Nurse who received alert:  Remo Lipps, RN  MD notified (1st page):  [place]  Time of first page:  0701

## 2015-10-09 NOTE — Progress Notes (Signed)
Pharmacy Antibiotic Note  Angel Hebert is a 60 y.o. female admitted on 10/08/2015 with LLL PNA / sepsis.  Pharmacy has been consulted for ceftriaxone and azithromycin dosing. Now day #2 of abx. Blood cx's are growing 2/2 GPC in pairs and chains. Strep pneumo antigen also positive. Afebrile today and WBC elevated at 17.1.  Plan: Increase ceftriaxone to 2g IV Q24 Continue azithromycin 500mg  IV Q24 Monitor clinical picture F/U C&S, abx deescalation / LOT  Height: 5\' 7"  (170.2 cm) Weight: 113 lb 5.1 oz (51.4 kg) IBW/kg (Calculated) : 61.6  Temp (24hrs), Avg:98.9 F (37.2 C), Min:97.5 F (36.4 C), Max:101.4 F (38.6 C)   Recent Labs Lab 10/08/15 0906 10/08/15 0936 10/08/15 1151 10/08/15 1539 10/09/15 0504  WBC 5.2  --   --   --  17.1*  CREATININE 0.88  --   --   --  0.78  LATICACIDVEN  --  6.65* 6.22* 4.29* 3.2*    Estimated Creatinine Clearance: 62.2 mL/min (by C-G formula based on Cr of 0.78).    No Known Allergies  Antimicrobials this admission: Ceftriaxone 2/5 >>  Azithromycin 2/5 >>   Dose adjustments this admission: n/a  Microbiology results: 2/5 BCx: 2/2 GPC in pairs and chains 2/5 UCx: sent  2/5 MRSA PCR: negative 2/5 RVP: sent  Thank you for allowing pharmacy to be a part of this patient's care.  Elenor Quinones, PharmD, BCPS Clinical Pharmacist Pager [phone] 10/09/2015 8:50 AM

## 2015-10-09 NOTE — Progress Notes (Signed)
Utilization Review Completed.  

## 2015-10-10 ENCOUNTER — Inpatient Hospital Stay (HOSPITAL_COMMUNITY): Payer: Self-pay

## 2015-10-10 DIAGNOSIS — A419 Sepsis, unspecified organism: Secondary | ICD-10-CM | POA: Diagnosis present

## 2015-10-10 DIAGNOSIS — J189 Pneumonia, unspecified organism: Secondary | ICD-10-CM | POA: Diagnosis present

## 2015-10-10 DIAGNOSIS — R7881 Bacteremia: Secondary | ICD-10-CM | POA: Diagnosis present

## 2015-10-10 DIAGNOSIS — A403 Sepsis due to Streptococcus pneumoniae: Secondary | ICD-10-CM | POA: Diagnosis present

## 2015-10-10 DIAGNOSIS — J90 Pleural effusion, not elsewhere classified: Secondary | ICD-10-CM | POA: Diagnosis present

## 2015-10-10 LAB — RESPIRATORY VIRUS PANEL
Adenovirus: NEGATIVE
INFLUENZA A: NEGATIVE
INFLUENZA B 1: NEGATIVE
Metapneumovirus: NEGATIVE
PARAINFLUENZA 1 A: NEGATIVE
PARAINFLUENZA 2 A: NEGATIVE
PARAINFLUENZA 3 A: NEGATIVE
Respiratory Syncytial Virus A: NEGATIVE
Respiratory Syncytial Virus B: NEGATIVE
Rhinovirus: NEGATIVE

## 2015-10-10 LAB — CBC
HEMATOCRIT: 22.7 % — AB (ref 36.0–46.0)
Hemoglobin: 7.3 g/dL — ABNORMAL LOW (ref 12.0–15.0)
MCH: 25.3 pg — AB (ref 26.0–34.0)
MCHC: 32.2 g/dL (ref 30.0–36.0)
MCV: 78.5 fL (ref 78.0–100.0)
Platelets: 390 10*3/uL (ref 150–400)
RBC: 2.89 MIL/uL — ABNORMAL LOW (ref 3.87–5.11)
RDW: 22.7 % — AB (ref 11.5–15.5)
WBC: 19.7 10*3/uL — ABNORMAL HIGH (ref 4.0–10.5)

## 2015-10-10 LAB — COMPREHENSIVE METABOLIC PANEL
ALT: 25 U/L (ref 14–54)
AST: 90 U/L — ABNORMAL HIGH (ref 15–41)
Albumin: 1.6 g/dL — ABNORMAL LOW (ref 3.5–5.0)
Alkaline Phosphatase: 81 U/L (ref 38–126)
Anion gap: 10 (ref 5–15)
BILIRUBIN TOTAL: 1 mg/dL (ref 0.3–1.2)
BUN: 7 mg/dL (ref 6–20)
CHLORIDE: 100 mmol/L — AB (ref 101–111)
CO2: 23 mmol/L (ref 22–32)
CREATININE: 0.49 mg/dL (ref 0.44–1.00)
Calcium: 7.9 mg/dL — ABNORMAL LOW (ref 8.9–10.3)
Glucose, Bld: 82 mg/dL (ref 65–99)
Potassium: 3.5 mmol/L (ref 3.5–5.1)
Sodium: 133 mmol/L — ABNORMAL LOW (ref 135–145)
Total Protein: 6 g/dL — ABNORMAL LOW (ref 6.5–8.1)

## 2015-10-10 LAB — PROCALCITONIN: PROCALCITONIN: 32.07 ng/mL

## 2015-10-10 LAB — MAGNESIUM: Magnesium: 2 mg/dL (ref 1.7–2.4)

## 2015-10-10 MED ORDER — DEXTROSE 5 % IV SOLN
500.0000 mg | INTRAVENOUS | Status: DC
  Administered 2015-10-10 – 2015-10-11 (×2): 500 mg via INTRAVENOUS
  Filled 2015-10-10 (×2): qty 500

## 2015-10-10 NOTE — Progress Notes (Signed)
[place] TEAM 1 - Stepdown/ICU TEAM Progress Note  Angel Hebert [id number] DOB: 1955-10-14 DOA: 10/08/2015 PCP: Lance Bosch, NP  Admit HPI / Brief Narrative: 60 yo BF PMHx Asthma, HTN, HLD,  Never smoker with asthma presented to ER on 10/08/15 with a 2 day hx of cough, congestion, fever/chills and sob . CXR showed a large LLL PNA and effusion, elevated lactate ~6 . She had tachypnea and was hypoxic . Improved with NRB. She was given IVF bolus with repeat lactate not showing significant improvement. WBC nml . ABG showed normal pH and no Co2 retention.    HPI/Subjective: 2/7 A/O 4 NAD  Assessment/Plan:  Severe Sepsis due to Strep pneumo LLL > RLL CAP with left effusion  -Lactate is trending down  - cont normal saline 100 ml/hr  - Recheck effusion on 2/9 may require diagnostic/therapeutic thoracentesis  Bacteremia 2/2 blood cxpositive strep pneumoniae  Likely Strep pneumo - cont abx coverage and follow   Sinus Tachycardia  -Due to above - hydrate and follow   Hypokalemia  -Due to poor intake/malnutrition - check Mg - follow  -Resolved  Asthma  -Well compensated at present   HTN  -BP controlled at this time - follow trend   GERD on home PPI   Transaminitis -Likely shock liver v/s direct irritation from RLL PNA - resolving w/ hydration   Anemia  -Likely due to chronic poor nutrition - follow -Stable     Code Status: FULL Family Communication: no family present at time of exam Disposition Plan: SNF? Will await PT/OT recommendations   Consultants: PCCM  Procedure/Significant Events:    Culture 2/5 blood right hand/forearm positive strep pneumoniae 2/5 urine positive multiple species 2/5 MRSA by PCR negative 2/5 strep pneumo urine antigen positive  2/5 Legionella urine antigen negative    Antibiotics: Azithromycin 2/5 > Rocephin 2/5 >  DVT prophylaxis: Lovenox   Devices    LINES / TUBES:      Continuous Infusions: . sodium  chloride Stopped (10/10/15 1500)    Objective: VITAL SIGNS: Temp: 97.9 F (36.6 C) (02/07 2007) Temp Source: Oral (02/07 2007) BP: 131/93 mmHg (02/07 2007) Pulse Rate: 116 (02/07 2007) SPO2; FIO2:   Intake/Output Summary (Last 24 hours) at 10/10/15 2131 Last data filed at 10/10/15 1545  Gross per 24 hour  Intake   2190 ml  Output    925 ml  Net   1265 ml     Exam: General: A/O 4, NAD, No acute respiratory distress Eyes: Negative headache, negative scleral hemorrhage ENT: Negative Runny nose, negative gingival bleeding, Neck:  Negative scars, masses, torticollis, lymphadenopathy, JVD Lungs: Clear to auscultation on the right with decreased breath sounds LLL without wheezes or crackles Cardiovascular: Regular rate and rhythm without murmur gallop or rub normal S1 and S2 Abdomen:negative abdominal pain, nondistended, positive soft, bowel sounds, no rebound, no ascites, no appreciable mass Extremities: No significant cyanosis, clubbing, or edema bilateral lower extremities Psychiatric:  Negative depression, negative anxiety, negative fatigue, negative mania  Neurologic:  Cranial nerves II through XII intact, tongue/uvula midline, all extremities muscle strength 5/5, sensation intact throughout, negative dysarthria, negative expressive aphasia, negative receptive aphasia.   Data Reviewed: Basic Metabolic Panel:  Recent Labs Lab 10/08/15 0906 10/09/15 0504 10/10/15 0518  NA 133* 137 133*  K 2.7* 2.5* 3.5  CL 91* 100* 100*  CO2 22 22 23   GLUCOSE 75 145* 82  BUN 19 10 7   CREATININE 0.88 0.78 0.49  CALCIUM 9.0 7.8* 7.9*  MG  --   --  2.0   Liver Function Tests:  Recent Labs Lab 10/08/15 0906 10/09/15 0504 10/10/15 0518  AST 405* 213* 90*  ALT 54 36 25  ALKPHOS 64 52 81  BILITOT 1.2 1.4* 1.0  PROT 7.2 5.6* 6.0*  ALBUMIN 2.3* 1.7* 1.6*   No results for input(s): LIPASE, AMYLASE in the last 168 hours. No results for input(s): AMMONIA in the last 168  hours. CBC:  Recent Labs Lab 10/08/15 0906 10/09/15 0504 10/10/15 0518  WBC 5.2 17.1* 19.7*  NEUTROABS 4.7  --   --   HGB 9.4* 7.9* 7.3*  HCT 28.6* 23.9* 22.7*  MCV 80.6 79.9 78.5  PLT 366 396 390   Cardiac Enzymes:  Recent Labs Lab 10/08/15 0939  TROPONINI <0.03   BNP (last 3 results)  Recent Labs  10/08/15 0939  BNP 124.5*    ProBNP (last 3 results) No results for input(s): PROBNP in the last 8760 hours.  CBG: No results for input(s): GLUCAP in the last 168 hours.  Recent Results (from the past 240 hour(s))  Blood Culture (routine x 2)     Status: None (Preliminary result)   Collection Time: 10/08/15  9:57 AM  Result Value Ref Range Status   Specimen Description BLOOD RIGHT HAND  Final   Special Requests BOTTLES DRAWN AEROBIC ONLY 5CCS  Final   Culture  Setup Time   Final    AEROBIC BOTTLE ONLY GRAM POSITIVE COCCI IN PAIRS IN CHAINS CRITICAL RESULT CALLED TO, READ BACK BY AND VERIFIED WITH: [facility] 2033 10/08/15 M.CAMPBELL    Culture   Final    STREPTOCOCCUS PNEUMONIAE SUSCEPTIBILITIES TO FOLLOW    Report Status PENDING  Incomplete  Blood Culture (routine x 2)     Status: None (Preliminary result)   Collection Time: 10/08/15 10:00 AM  Result Value Ref Range Status   Specimen Description BLOOD LEFT FOREARM  Final   Special Requests BOTTLES DRAWN AEROBIC ONLY 5CCS  Final   Culture  Setup Time   Final    AEROBIC BOTTLE ONLY GRAM POSITIVE COCCI IN PAIRS IN CHAINS CRITICAL RESULT CALLED TO, READ BACK BY AND VERIFIED WITH: [facility] 2033 10/08/15 M.CAMPBELL    Culture STREPTOCOCCUS PNEUMONIAE  Final   Report Status PENDING  Incomplete  Urine culture     Status: None   Collection Time: 10/08/15 11:00 AM  Result Value Ref Range Status   Specimen Description URINE, CLEAN CATCH  Final   Special Requests NONE  Final   Culture MULTIPLE SPECIES PRESENT, SUGGEST RECOLLECTION  Final   Report Status 10/09/2015 FINAL  Final  MRSA PCR Screening     Status:  None   Collection Time: 10/08/15  7:17 PM  Result Value Ref Range Status   MRSA by PCR NEGATIVE NEGATIVE Final    Comment:        The GeneXpert MRSA Assay (FDA approved for NASAL specimens only), is one component of a comprehensive MRSA colonization surveillance program. It is not intended to diagnose MRSA infection nor to guide or monitor treatment for MRSA infections.      Studies:  Recent x-ray studies have been reviewed in detail by the Attending Physician  Scheduled Meds:  Scheduled Meds: . antiseptic oral rinse  7 mL Mouth Rinse BID  . arformoterol  15 mcg Nebulization BID  . azithromycin  500 mg Intravenous Q24H  . budesonide (PULMICORT) nebulizer solution  0.25 mg Nebulization BID  . cefTRIAXone (ROCEPHIN)  IV  2 g  Intravenous Q24H  . dextromethorphan-guaiFENesin  1 tablet Oral BID  . enoxaparin (LOVENOX) injection  40 mg Subcutaneous Q24H  . pantoprazole  40 mg Oral Daily    Time spent on care of this patient: 40 mins   Gotti Alwin, Geraldo Docker , MD  [facility] Office  [phone] Pager - [phone]  On-Call/Text Page:      [email or web address]      password [email or web address]  If 7PM-7AM, please contact night-coverage [email or web address] Password [email or web address] 10/10/2015, 9:31 PM   LOS: 2 days   Care during the described time interval was provided by me .  I have reviewed this patient's available data, including medical history, events of note, physical examination, and all test results as part of my evaluation. I have personally reviewed and interpreted all radiology studies.   Dia Crawford, MD [phone] Pager

## 2015-10-10 NOTE — Plan of Care (Signed)
Problem: Respiratory: Goal: Respiratory status will improve Outcome: Progressing Pt is on 2L [place] at night and RA during the day.  Pt has had no episodes of respiratory distress.

## 2015-10-10 NOTE — Plan of Care (Signed)
Problem: Activity: Goal: Ability to tolerate increased activity will improve Outcome: Progressing Pt moves around in the bed easily and independently. Pt easily transfers from bed to [facility] with minimal assistance.

## 2015-10-11 DIAGNOSIS — A409 Streptococcal sepsis, unspecified: Principal | ICD-10-CM

## 2015-10-11 LAB — CULTURE, BLOOD (ROUTINE X 2)

## 2015-10-11 LAB — CBC
HEMATOCRIT: 23.9 % — AB (ref 36.0–46.0)
Hemoglobin: 7.8 g/dL — ABNORMAL LOW (ref 12.0–15.0)
MCH: 25.3 pg — ABNORMAL LOW (ref 26.0–34.0)
MCHC: 32.6 g/dL (ref 30.0–36.0)
MCV: 77.6 fL — ABNORMAL LOW (ref 78.0–100.0)
PLATELETS: 490 10*3/uL — AB (ref 150–400)
RBC: 3.08 MIL/uL — ABNORMAL LOW (ref 3.87–5.11)
RDW: 22.9 % — AB (ref 11.5–15.5)
WBC: 18.2 10*3/uL — AB (ref 4.0–10.5)

## 2015-10-11 LAB — IRON AND TIBC
IRON: 11 ug/dL — AB (ref 28–170)
Saturation Ratios: 5 % — ABNORMAL LOW (ref 10.4–31.8)
TIBC: 231 ug/dL — ABNORMAL LOW (ref 250–450)
UIBC: 220 ug/dL

## 2015-10-11 LAB — LACTIC ACID, PLASMA: Lactic Acid, Venous: 2.3 mmol/L (ref 0.5–2.0)

## 2015-10-11 LAB — RETICULOCYTES: RBC.: 3.08 MIL/uL — AB (ref 3.87–5.11)

## 2015-10-11 LAB — FERRITIN: FERRITIN: 231 ng/mL (ref 11–307)

## 2015-10-11 LAB — FOLATE: Folate: 11.9 ng/mL (ref >5.9)

## 2015-10-11 LAB — VITAMIN B12: Vitamin B-12: 3217 pg/mL — ABNORMAL HIGH (ref 180–914)

## 2015-10-11 MED ORDER — PREDNISONE 20 MG PO TABS
20.0000 mg | ORAL_TABLET | Freq: Every day | ORAL | Status: DC
  Administered 2015-10-12 – 2015-10-15 (×4): 20 mg via ORAL
  Filled 2015-10-11 (×4): qty 1

## 2015-10-11 MED ORDER — HYDROCORTISONE NA SUCCINATE PF 100 MG IJ SOLR
50.0000 mg | Freq: Three times a day (TID) | INTRAMUSCULAR | Status: AC
  Administered 2015-10-11 (×2): 50 mg via INTRAVENOUS
  Filled 2015-10-11 (×2): qty 2

## 2015-10-11 NOTE — Progress Notes (Signed)
[place] TEAM 1 - Stepdown/ICU TEAM PROGRESS NOTE  Angel Hebert [id number] DOB: 1955-12-20 DOA: 10/08/2015 PCP: Lance Bosch, NP  Admit HPI / Brief Narrative: 60 yo female never smoker with asthma presented to ER on 10/08/15 with a 2 day hx of cough, congestion, fever/chills and sob. CXR showed a large LLL PNA and effusion, elevated lactate ~6. She had tachypnea and was hypoxic. Improved with NRB. She was given IVF bolus with repeat lactate not showing significant improvement. WBC nml. ABG showed normal pH and no Co2 retention.   HPI/Subjective: The patient states she feels much better overall.  She denies chest pain shortness of breath fevers chills nausea vomiting or anxiety.  She tells me she is on chronic prednisone for "nasal polyps" and tells me she has been on it "as long as I can remember."  Assessment/Plan:  Severe Sepsis due to Strep pneumo LLL > RLL CAP with left effusion  Lactate has trended down - cont to hydrate but slow rate - follow effusion on serial CXR in AM   Strep pneumo Bacteremia 2/2 blood cx  cont focused abx coverage and follow clinically   Sinus Tachycardia  Likely due to combination of infection as well as mild adrenal insuff (due to pred) - resume home prednisone after initial hydrocortisone burst - follow on tele   Hypokalemia  Due to poor intake/malnutrition - Mg normal - replace and follow   Mild hyponatremia Follow w/ hydration and resumption of steroids   Asthma  Well compensated at present   HTN  BP controlled at this time - follow trend   GERD on home PPI   Transaminitis Likely shock liver v/s direct irritation from RLL PNA - improving   Anemia  Likely due to chronic poor nutrition - Hgb trending down w/ hydration - no evidence of blood loss - check anemia panel - no IV Fe for now given systemic infection/bacteremia   Code Status: FULL Family Communication: no family present at time of exam today  Disposition Plan: stable for  transfer to tele bed - f/u Hgb - f/u CXR in AM - cont IV abx   Consultants: PCCM  Procedures: none  Antibiotics: Azithromycin 2/5 > 2/8 Rocephin 2/5 >  DVT prophylaxis: lovenox   Objective: Blood pressure 131/84, pulse 115, temperature 98 F (36.7 C), temperature source Axillary, resp. rate 46, height 5\' 7"  (1.702 m), weight 58.4 kg (128 lb 12 oz), SpO2 96 %.  Intake/Output Summary (Last 24 hours) at 10/11/15 1439 Last data filed at 10/11/15 1207  Gross per 24 hour  Intake   1920 ml  Output    875 ml  Net   1045 ml   Exam: General: No acute respiratory distress at rest in chair Lungs: coarse B basilar crackles - poor air movement in R base  Cardiovascular: tachycardic but regular - no gallup or rub or M Abdomen: Nontender, nondistended, soft, bowel sounds positive, no rebound, no ascites, no appreciable mass Extremities: No significant cyanosis, clubbing, edema bilateral lower extremities  Data Reviewed:  Basic Metabolic Panel:  Recent Labs Lab 10/08/15 0906 10/09/15 0504 10/10/15 0518  NA 133* 137 133*  K 2.7* 2.5* 3.5  CL 91* 100* 100*  CO2 22 22 23   GLUCOSE 75 145* 82  BUN 19 10 7   CREATININE 0.88 0.78 0.49  CALCIUM 9.0 7.8* 7.9*  MG  --   --  2.0    CBC:  Recent Labs Lab 10/08/15 0906 10/09/15 0504 10/10/15 0518  WBC  5.2 17.1* 19.7*  NEUTROABS 4.7  --   --   HGB 9.4* 7.9* 7.3*  HCT 28.6* 23.9* 22.7*  MCV 80.6 79.9 78.5  PLT 366 396 390   Liver Function Tests:  Recent Labs Lab 10/08/15 0906 10/09/15 0504 10/10/15 0518  AST 405* 213* 90*  ALT 54 36 25  ALKPHOS 64 52 81  BILITOT 1.2 1.4* 1.0  PROT 7.2 5.6* 6.0*  ALBUMIN 2.3* 1.7* 1.6*   Cardiac Enzymes:  Recent Labs Lab 10/08/15 0939  TROPONINI <0.03    Recent Results (from the past 240 hour(s))  Blood Culture (routine x 2)     Status: None   Collection Time: 10/08/15  9:57 AM  Result Value Ref Range Status   Specimen Description BLOOD RIGHT HAND  Final   Special Requests  BOTTLES DRAWN AEROBIC ONLY 5CCS  Final   Culture  Setup Time   Final    AEROBIC BOTTLE ONLY GRAM POSITIVE COCCI IN PAIRS IN CHAINS CRITICAL RESULT CALLED TO, READ BACK BY AND VERIFIED WITH: [facility] 2033 10/08/15 M.CAMPBELL    Culture STREPTOCOCCUS PNEUMONIAE  Final   Report Status 10/11/2015 FINAL  Final   Organism ID, Bacteria STREPTOCOCCUS PNEUMONIAE  Final      Susceptibility   Streptococcus pneumoniae - MIC*    ERYTHROMYCIN <=0.12 SENSITIVE Sensitive     LEVOFLOXACIN 0.5 SENSITIVE Sensitive     VANCOMYCIN 0.5 SENSITIVE Sensitive     PENICILLIN <=0.06 SENSITIVE Sensitive     CEFTRIAXONE <=0.12 SENSITIVE Sensitive     * STREPTOCOCCUS PNEUMONIAE  Blood Culture (routine x 2)     Status: None   Collection Time: 10/08/15 10:00 AM  Result Value Ref Range Status   Specimen Description BLOOD LEFT FOREARM  Final   Special Requests BOTTLES DRAWN AEROBIC ONLY 5CCS  Final   Culture  Setup Time   Final    AEROBIC BOTTLE ONLY GRAM POSITIVE COCCI IN PAIRS IN CHAINS CRITICAL RESULT CALLED TO, READ BACK BY AND VERIFIED WITH: [facility] 2033 10/08/15 M.CAMPBELL    Culture   Final    STREPTOCOCCUS PNEUMONIAE SUSCEPTIBILITIES PERFORMED ON PREVIOUS CULTURE WITHIN THE LAST 5 DAYS.    Report Status 10/11/2015 FINAL  Final  Urine culture     Status: None   Collection Time: 10/08/15 11:00 AM  Result Value Ref Range Status   Specimen Description URINE, CLEAN CATCH  Final   Special Requests NONE  Final   Culture MULTIPLE SPECIES PRESENT, SUGGEST RECOLLECTION  Final   Report Status 10/09/2015 FINAL  Final  Respiratory virus panel     Status: None   Collection Time: 10/08/15  4:00 PM  Result Value Ref Range Status   Respiratory Syncytial Virus A Negative Negative Final   Respiratory Syncytial Virus B Negative Negative Final   Influenza A Negative Negative Final   Influenza B Negative Negative Final   Parainfluenza 1 Negative Negative Final   Parainfluenza 2 Negative Negative Final    Parainfluenza 3 Negative Negative Final   Metapneumovirus Negative Negative Final   Rhinovirus Negative Negative Final   Adenovirus Negative Negative Final    Comment: (NOTE) Performed At: [facility] [address] [place], [place] [id number] Lindon Romp MD [id number]   MRSA PCR Screening     Status: None   Collection Time: 10/08/15  7:17 PM  Result Value Ref Range Status   MRSA by PCR NEGATIVE NEGATIVE Final    Comment:        The GeneXpert MRSA Assay (FDA  approved for NASAL specimens only), is one component of a comprehensive MRSA colonization surveillance program. It is not intended to diagnose MRSA infection nor to guide or monitor treatment for MRSA infections.      Studies:   Recent x-ray studies have been reviewed in detail by the Attending Physician  Scheduled Meds:  Scheduled Meds: . antiseptic oral rinse  7 mL Mouth Rinse BID  . arformoterol  15 mcg Nebulization BID  . azithromycin  500 mg Intravenous Q24H  . budesonide (PULMICORT) nebulizer solution  0.25 mg Nebulization BID  . cefTRIAXone (ROCEPHIN)  IV  2 g Intravenous Q24H  . dextromethorphan-guaiFENesin  1 tablet Oral BID  . enoxaparin (LOVENOX) injection  40 mg Subcutaneous Q24H  . pantoprazole  40 mg Oral Daily    Time spent on care of this patient: 35 mins   Nicolemarie Wooley T , MD   [facility] Office  [phone] Pager - Text Page per Shea Evans as per below:  On-Call/Text Page:      [email or web address]      password [email or web address]  If 7PM-7AM, please contact night-coverage [email or web address] Password [email or web address] 10/11/2015, 2:39 PM   LOS: 3 days

## 2015-10-11 NOTE — Progress Notes (Signed)
Angel Hebert [id number] Admission Data: 10/11/2015 6:13 PM Attending Provider: Cherene Altes, MD  [id number] [place], NP Consults/ Treatment Team:    Angel Hebert is a 60 y.o. female patient transfer from [facility] alert  & orientated  X $,  Full Code, VSS - Blood pressure 148/97, pulse 112, temperature 98.7 F (37.1 C), temperature source Oral, resp. rate 17, height 5\' 7"  (1.702 m), weight 58.4 kg (128 lb 12 oz), SpO2 97 %., Room Air, no c/o shortness of breath, no c/o chest pain, no distress noted. Tele # [phone] placed.    IV site WDL:  Right AC with a transparent dsg that's clean dry and intact. Running Normal Saline at 60 mL/Hour   Allergies:  No Known Allergies   Past Medical History  Diagnosis Date  . Asthma   . High cholesterol   . Hypertension   . GERD (gastroesophageal reflux disease)     Pt orientation to unit, room and routine.Upper Siderails up x 2, fall risk assessment complete with Patient verbalizing understanding of risks associated with falls. Pt verbalizes an understanding of how to use the call bell and to call for help before getting out of bed.  Skin, clean-dry. RN noted stage 2 on sacrum, RN placed foam dressing on site.  Skin exam done with Angel Liberty RN.   Will cont to monitor and assist as needed.  Dorita Fray, RN 10/11/2015 6:16 PM

## 2015-10-11 NOTE — Evaluation (Signed)
Occupational Therapy Evaluation Patient Details Name: Angel Hebert MRN: [id number] DOB: 1956-06-18 Today's Date: 10/11/2015    History of Present Illness 60 yo female admitted with LLL PNA effusion and elevated lactate 6. Severe sepsis due to strep LLL and sinus tachycardia. PMH: asthma, HTN, GERD   Clinical Impression   PT admitted with LLL PNA with AMS. Pt currently with functional limitiations due to the deficits listed below (see OT problem list). PTA independent with all adls living with a friend.  Pt will benefit from skilled OT to increase their independence and safety with adls and balance to allow discharge SNF due to ams and balance deficits. Pt incontinent of bladder and required Min (A) for balance.     Follow Up Recommendations  SNF;Supervision/Assistance - 24 hour (cognitive deficits needs 24/ 7 (A))    Equipment Recommendations  Other (comment) (defer to venue)    Recommendations for Other Services       Precautions / Restrictions Precautions Precautions: Fall Precaution Comments: watch HR and O2 Restrictions Weight Bearing Restrictions: No      Mobility Bed Mobility Overal bed mobility: Independent                Transfers Overall transfer level: Needs assistance   Transfers: Sit to/from Stand Sit to Stand: Min assist              Balance Overall balance assessment: Needs assistance         Standing balance support: During functional activity;Single extremity supported Standing balance-Leahy Scale: Poor                              ADL Overall ADL's : Needs assistance/impaired Eating/Feeding: Set up   Grooming: Wash/dry face;Oral care;Applying deodorant;Minimal assistance;Standing Grooming Details (indicate cue type and reason): hr incr 135 Upper Body Bathing: Minimal assitance;Standing   Lower Body Bathing: Minimal assistance;Sitting/lateral leans       Lower Body Dressing: Minimal assistance;Sitting/lateral leans    Toilet Transfer: Minimal assistance;Ambulation   Toileting- Clothing Manipulation and Hygiene: Minimal assistance;Sit to/from stand Toileting - Clothing Manipulation Details (indicate cue type and reason): where is the tissue?     Functional mobility during ADLs: Minimal assistance General ADL Comments: Pt unsteady with ambulation and needs physical (A) but reports "i am okay"     Vision     Perception     Praxis      Pertinent Vitals/Pain Pain Assessment: No/denies pain     Hand Dominance Right   Extremity/Trunk Assessment Upper Extremity Assessment Upper Extremity Assessment: Overall WFL for tasks assessed   Lower Extremity Assessment Lower Extremity Assessment: Defer to PT evaluation   Cervical / Trunk Assessment Cervical / Trunk Assessment: Normal   Communication Communication Communication: No difficulties   Cognition Arousal/Alertness: Awake/alert Behavior During Therapy: Flat affect Overall Cognitive Status: Impaired/Different from baseline Area of Impairment: Safety/judgement;Awareness;Problem solving;Following commands;Attention;Orientation Orientation Level: Disoriented to;Situation Current Attention Level: Focused Memory: Decreased short-term memory Following Commands: Follows one step commands inconsistently Safety/Judgement: Decreased awareness of deficits;Decreased awareness of safety Awareness: Intellectual Problem Solving: Slow processing;Difficulty sequencing General Comments: requires (A) to initiate and sequence adl at sink. Pt reports "i need to go to the bathroom and immediately voiding  bladder in floor. Pt needed (A) to sequence LB bathing    General Comments       Exercises       Shoulder Instructions      Home Living Family/patient expects to  be discharged to:: Private residence Living Arrangements: Non-relatives/Friends Available Help at Discharge: Available PRN/intermittently;Friend(s) Type of Home: House Home Access: [place]: One level     Bathroom Shower/Tub: Teacher, early years/pre: Carpenter: None   Additional Comments: lives with a friend. Boyfriend present at evaluation and works daytime hours doing sheet rock work. Pt will be home alone majority of the day      Prior Functioning/Environment Level of Independence: Independent             OT Diagnosis: Generalized weakness;Cognitive deficits   OT Problem List: Decreased strength;Decreased activity tolerance;Impaired balance (sitting and/or standing);Decreased cognition;Decreased safety awareness;Decreased knowledge of use of DME or AE;Decreased knowledge of precautions   OT Treatment/Interventions: Self-care/ADL training;Therapeutic exercise;DME and/or AE instruction;Therapeutic activities;Cognitive remediation/compensation;Patient/family education;Balance training    OT Goals(Current goals can be found in the care plan section) Acute Rehab OT Goals Patient Stated Goal: none stated OT Goal Formulation: Patient unable to participate in goal setting Time For Goal Achievement: 10/25/15 Potential to Achieve Goals: Good  OT Frequency: Min 2X/week   Barriers to D/C:            Co-evaluation              End of Session Equipment Utilized During Treatment: Gait belt Nurse Communication: Mobility status;Precautions  Activity Tolerance: Patient tolerated treatment well (inc HR) Patient left: in chair;with call bell/phone within reach;with chair alarm set;with family/visitor present;with nursing/sitter in room (boyfriend and RN present)   Time: [id number] OT Time Calculation (min): 21 min Charges:  OT General Charges $OT Visit: 1 Procedure OT Evaluation $OT Eval Moderate Complexity: 1 Procedure G-Codes:    Parke Poisson B 2015-11-09, 9:16 AM   Jeri Modena   OTR/L [phone] Office: [phone] .

## 2015-10-11 NOTE — Progress Notes (Signed)
10/11/2015 Rn on 2 central did made nurse on 5w patient respiratory rate goes in the 30's to the 40's. Did send a text via amion to Dr Thereasa Solo to make him aware. Patient been asymptomatic and is not having any work of breathing. Rico Sheehan RN

## 2015-10-11 NOTE — Progress Notes (Addendum)
Resp 30's-50's. Asymptomatic. Resp shallow. No accessory muscles used. Vitals within normal range. Arouses easy. Alert X4. Resp lower when woken. Baltazar Najjar NP notified.

## 2015-10-11 NOTE — Evaluation (Signed)
Physical Therapy Evaluation Patient Details Name: Angel Hebert MRN: [id number] DOB: 1955-12-20 Today's Date: 10/11/2015   History of Present Illness  60 yo female admitted with LLL PNA effusion and elevated lactate 6. Severe sepsis due to strep LLL and sinus tachycardia. PMH: asthma, HTN, GERD  Clinical Impression  Patient demonstrates deficits in functional mobility as indicated below. Will benefit from continued skilled PT to address deficits and maximize function. Will see as indicated and progress as tolerated. OF NOTE: patient with several noted LOB during session, concerned regarding fall risk. Patient may benefit from [facility] upon acute discharge unless increased supervision 24/7 initially can be arranged. Will follow for progress,    Follow Up Recommendations Supervision/Assistance - 24 hour;SNF (May consider home if adequate supervision can be provided)    Equipment Recommendations   (TBD)    Recommendations for Other Services       Precautions / Restrictions Precautions Precautions: Fall Precaution Comments: watch HR and O2      Mobility  Bed Mobility               General bed mobility comments: received in recliner  Transfers Overall transfer level: Needs assistance   Transfers: Sit to/from Stand Sit to Stand: Min assist         General transfer comment: from chair and from toilet  Ambulation/Gait Ambulation/Gait assistance: Min assist Ambulation Distance (Feet): 160 Feet Assistive device: None Gait Pattern/deviations: Step-through pattern;Decreased stride length;Narrow base of support;Scissoring;Staggering right;Staggering left Gait velocity: decreased Gait velocity interpretation: <1.8 ft/sec, indicative of risk for recurrent falls General Gait Details: instability noted with ambulation, multiple LOB during higher level ambulation activity (max cues for increased cadence )  Stairs            Wheelchair Mobility    Modified Rankin (Stroke  Patients Only)       Balance Overall balance assessment: Needs assistance         Standing balance support: During functional activity Standing balance-Leahy Scale: Poor               High level balance activites: Turns;Sudden stops;Head turns High Level Balance Comments: patient with instability and multiple LOB requring assist to correct during higher level balance tasks. Slow processing for task instruction             Pertinent Vitals/Pain Pain Assessment: No/denies pain    Home Living Family/patient expects to be discharged to:: Private residence Living Arrangements: Non-relatives/Friends Available Help at Discharge: Available PRN/intermittently;Friend(s) Type of Home: House Home Access: Ramped entrance     Home Layout: One level Home Equipment: None Additional Comments: lives with boyfriend, but boyfriend works and patient is home majority of day alone    Prior Function Level of Independence: Independent               Hand Dominance   Dominant Hand: Right    Extremity/Trunk Assessment               Lower Extremity Assessment:  (poor coordination)      Cervical / Trunk Assessment: Normal  Communication   Communication: No difficulties  Cognition Arousal/Alertness: Awake/alert Behavior During Therapy: Flat affect Overall Cognitive Status: Impaired/Different from baseline Area of Impairment: Safety/judgement;Awareness;Problem solving;Following commands;Attention;Orientation Orientation Level: Disoriented to;Situation Current Attention Level: Focused Memory: Decreased short-term memory Following Commands: Follows one step commands inconsistently Safety/Judgement: Decreased awareness of deficits;Decreased awareness of safety Awareness: Intellectual Problem Solving: Slow processing;Difficulty sequencing      General Comments General comments (skin  integrity, edema, etc.): patient was able to use bathroom and perform pericare and  hygiene without assist    Exercises        Assessment/Plan    PT Assessment Patient needs continued PT services  PT Diagnosis Difficulty walking;Abnormality of gait   PT Problem List Decreased strength;Decreased activity tolerance;Decreased balance;Decreased mobility;Decreased coordination;Decreased cognition;Decreased safety awareness  PT Treatment Interventions DME instruction;Gait training;Stair training;Functional mobility training;Therapeutic activities;Therapeutic exercise;Balance training;Patient/family education;Cognitive remediation   PT Goals (Current goals can be found in the Care Plan section) Acute Rehab PT Goals Patient Stated Goal: none stated PT Goal Formulation: With patient Time For Goal Achievement: 10/25/15 Potential to Achieve Goals: Good    Frequency Min 3X/week   Barriers to discharge Decreased caregiver support boyfriend works during the day, patient would be by herself    Co-evaluation               End of Session Equipment Utilized During Treatment: Gait belt Activity Tolerance: Patient limited by fatigue Patient left: in chair;with call bell/phone within reach;with chair alarm set Nurse Communication: Mobility status         Time: [id number] PT Time Calculation (min) (ACUTE ONLY): 17 min   Charges:   PT Evaluation $PT Eval Moderate Complexity: 1 Procedure     PT G CodesDuncan Dull 10/23/15, 3:15 PM  Alben Deeds, [place] DPT  [phone]

## 2015-10-11 NOTE — Progress Notes (Addendum)
CRITICAL VALUE ALERT  Critical value received:  Lactic Acid, Venous (2.3)  Date of notification:  10/11/2015  Time of notification:  [id number]  Critical value read back:Yes.    Nurse who received alert:  Rico Sheehan   MD notified (1st page):  Dr Thereasa Solo Via Amion  Time of first page:  6  MD notified (2nd page):  Time of second page:  Responding MD:  Made MD Aware via Amion spoke to Dr Thereasa Solo at [facility] via telephone.  Time MD responded:  Made MD Aware via Shea Evans

## 2015-10-12 ENCOUNTER — Inpatient Hospital Stay (HOSPITAL_COMMUNITY): Payer: MEDICAID

## 2015-10-12 DIAGNOSIS — E876 Hypokalemia: Secondary | ICD-10-CM

## 2015-10-12 DIAGNOSIS — D649 Anemia, unspecified: Secondary | ICD-10-CM

## 2015-10-12 DIAGNOSIS — R7881 Bacteremia: Secondary | ICD-10-CM

## 2015-10-12 DIAGNOSIS — A403 Sepsis due to Streptococcus pneumoniae: Secondary | ICD-10-CM

## 2015-10-12 LAB — COMPREHENSIVE METABOLIC PANEL
ALBUMIN: 1.5 g/dL — AB (ref 3.5–5.0)
ALT: 59 U/L — ABNORMAL HIGH (ref 14–54)
ANION GAP: 12 (ref 5–15)
AST: 148 U/L — AB (ref 15–41)
Alkaline Phosphatase: 111 U/L (ref 38–126)
BUN: 6 mg/dL (ref 6–20)
CO2: 26 mmol/L (ref 22–32)
Calcium: 7.6 mg/dL — ABNORMAL LOW (ref 8.9–10.3)
Chloride: 96 mmol/L — ABNORMAL LOW (ref 101–111)
Creatinine, Ser: 0.39 mg/dL — ABNORMAL LOW (ref 0.44–1.00)
GFR calc Af Amer: >60 mL/min (ref >60)
GFR calc non Af Amer: >60 mL/min (ref >60)
GLUCOSE: 96 mg/dL (ref 65–99)
POTASSIUM: 3.3 mmol/L — AB (ref 3.5–5.1)
SODIUM: 134 mmol/L — AB (ref 135–145)
Total Bilirubin: 1.1 mg/dL (ref 0.3–1.2)
Total Protein: 5.7 g/dL — ABNORMAL LOW (ref 6.5–8.1)

## 2015-10-12 LAB — CBC
HCT: 22.4 % — ABNORMAL LOW (ref 36.0–46.0)
Hemoglobin: 7.6 g/dL — ABNORMAL LOW (ref 12.0–15.0)
MCH: 26.7 pg (ref 26.0–34.0)
MCHC: 33.9 g/dL (ref 30.0–36.0)
MCV: 78.6 fL (ref 78.0–100.0)
Platelets: 435 10*3/uL — ABNORMAL HIGH (ref 150–400)
RBC: 2.85 MIL/uL — ABNORMAL LOW (ref 3.87–5.11)
RDW: 22.6 % — AB (ref 11.5–15.5)
WBC: 16.6 10*3/uL — ABNORMAL HIGH (ref 4.0–10.5)

## 2015-10-12 LAB — PROCALCITONIN: PROCALCITONIN: 9.15 ng/mL

## 2015-10-12 MED ORDER — GUAIFENESIN-DM 100-10 MG/5ML PO SYRP
5.0000 mL | ORAL_SOLUTION | ORAL | Status: DC | PRN
  Administered 2015-10-12 – 2015-10-15 (×4): 5 mL via ORAL
  Filled 2015-10-12 (×5): qty 5

## 2015-10-12 MED ORDER — METHOCARBAMOL 500 MG PO TABS
500.0000 mg | ORAL_TABLET | Freq: Three times a day (TID) | ORAL | Status: DC | PRN
  Administered 2015-10-12 – 2015-10-14 (×4): 500 mg via ORAL
  Filled 2015-10-12 (×4): qty 1

## 2015-10-12 NOTE — Progress Notes (Signed)
PROGRESS NOTE  Angel Hebert [id number] DOB: 1955-11-28 DOA: 10/08/2015 PCP: Lance Bosch, NP  Admit HPI / Brief Narrative: 60 yo female never smoker with asthma presented to ER on 10/08/15 with a 2 day hx of cough, congestion, fever/chills and sob. CXR showed a large LLL PNA and effusion, elevated lactate ~6. She had tachypnea and was hypoxic. Improved with NRB. She was given IVF bolus with repeat lactate not showing significant improvement. WBC nml. ABG showed normal pH and no Co2 retention.   HPI/Subjective: The patient states she feels much better overall.  Complaining of coughing spells and lower back pain. denies overt bleeding. No CP and no fever. AAOX3  Assessment/Plan:  Severe Sepsis due to Strep pneumo LLL > RLL CAP with left effusion  -Lactate has trended down  -afebrile and with WBC's trending down -patient weak and physically deconditioned; but overall better and improving  -good O2 sat on RA  Strep pneumo Bacteremia 2/2 blood cx  -cont focused abx coverage and follow clinically  -non toxic currently   Sinus Tachycardia  -Likely due to combination of infection as well as mild adrenal insuff -resume home prednisone after initial hydrocortisone burst  -HR stable for the last 24 hours; will d/c telemetry   Hypokalemia  -Due to poor intake/malnutrition - Mg normal - replace as needed   Mild hyponatremia -Follow w/ hydration and resumption of steroids   Asthma  -Well compensated at present  -continue home bronchodilators regimen   HTN  -BP controlled at this time - follow trend   GERD -continue PPI   Transaminitis -Likely shock liver v/s direct irritation from RLL PNA - improving   Anemia  -Likely due to chronic poor nutrition -Hgb trending down w/ hydration - no evidence of blood loss - check anemia panel - no IV Fe for now given systemic infection/bacteremia. -will transfuse as needed   Code Status: FULL Family Communication: no family present at  time of exam today  Disposition Plan: stable for transfer to tele bed - f/u Hgb - f/u CXR in AM - cont IV abx   Consultants: PCCM  Procedures: none  Antibiotics: Azithromycin 2/5 > 2/8 Rocephin 2/5 >  DVT prophylaxis: lovenox   Objective: Blood pressure 149/91, pulse 86, temperature 98.1 F (36.7 C), temperature source Oral, resp. rate 18, height 5\' 7"  (1.702 m), weight 55.8 kg (123 lb 0.3 oz), SpO2 100 %.  Intake/Output Summary (Last 24 hours) at 10/12/15 1746 Last data filed at 10/12/15 0715  Gross per 24 hour  Intake    840 ml  Output    300 ml  Net    540 ml   Exam: General: No acute respiratory; afebrile and complaining just of lower back pain and coughing spells. Lungs: improve air movement, no wheezing, positive rhonchi  Cardiovascular: RRR- no gallup or rub or M Abdomen: Nontender, nondistended, soft, bowel sounds positive, no rebound, no ascites, no appreciable mass Extremities: No significant cyanosis, clubbing, edema bilateral lower extremities  Data Reviewed:  Basic Metabolic Panel:  Recent Labs Lab 10/08/15 0906 10/09/15 0504 10/10/15 0518 10/12/15 0539  NA 133* 137 133* 134*  K 2.7* 2.5* 3.5 3.3*  CL 91* 100* 100* 96*  CO2 22 22 23 26   GLUCOSE 75 145* 82 96  BUN 19 10 7 6   CREATININE 0.88 0.78 0.49 0.39*  CALCIUM 9.0 7.8* 7.9* 7.6*  MG  --   --  2.0  --     CBC:  Recent Labs Lab 10/08/15  [id number] 10/09/15 0504 10/10/15 0518 10/11/15 1542 10/12/15 0539  WBC 5.2 17.1* 19.7* 18.2* 16.6*  NEUTROABS 4.7  --   --   --   --   HGB 9.4* 7.9* 7.3* 7.8* 7.6*  HCT 28.6* 23.9* 22.7* 23.9* 22.4*  MCV 80.6 79.9 78.5 77.6* 78.6  PLT 366 396 390 490* 435*   Liver Function Tests:  Recent Labs Lab 10/08/15 0906 10/09/15 0504 10/10/15 0518 10/12/15 0539  AST 405* 213* 90* 148*  ALT 54 36 25 59*  ALKPHOS 64 52 81 111  BILITOT 1.2 1.4* 1.0 1.1  PROT 7.2 5.6* 6.0* 5.7*  ALBUMIN 2.3* 1.7* 1.6* 1.5*   Cardiac Enzymes:  Recent Labs Lab  10/08/15 0939  TROPONINI <0.03    Recent Results (from the past 240 hour(s))  Blood Culture (routine x 2)     Status: None   Collection Time: 10/08/15  9:57 AM  Result Value Ref Range Status   Specimen Description BLOOD RIGHT HAND  Final   Special Requests BOTTLES DRAWN AEROBIC ONLY 5CCS  Final   Culture  Setup Time   Final    AEROBIC BOTTLE ONLY GRAM POSITIVE COCCI IN PAIRS IN CHAINS CRITICAL RESULT CALLED TO, READ BACK BY AND VERIFIED WITH: [facility] 2033 10/08/15 M.CAMPBELL    Culture STREPTOCOCCUS PNEUMONIAE  Final   Report Status 10/11/2015 FINAL  Final   Organism ID, Bacteria STREPTOCOCCUS PNEUMONIAE  Final      Susceptibility   Streptococcus pneumoniae - MIC*    ERYTHROMYCIN <=0.12 SENSITIVE Sensitive     LEVOFLOXACIN 0.5 SENSITIVE Sensitive     VANCOMYCIN 0.5 SENSITIVE Sensitive     PENICILLIN <=0.06 SENSITIVE Sensitive     CEFTRIAXONE <=0.12 SENSITIVE Sensitive     * STREPTOCOCCUS PNEUMONIAE  Blood Culture (routine x 2)     Status: None   Collection Time: 10/08/15 10:00 AM  Result Value Ref Range Status   Specimen Description BLOOD LEFT FOREARM  Final   Special Requests BOTTLES DRAWN AEROBIC ONLY 5CCS  Final   Culture  Setup Time   Final    AEROBIC BOTTLE ONLY GRAM POSITIVE COCCI IN PAIRS IN CHAINS CRITICAL RESULT CALLED TO, READ BACK BY AND VERIFIED WITH: [facility] 2033 10/08/15 M.CAMPBELL    Culture   Final    STREPTOCOCCUS PNEUMONIAE SUSCEPTIBILITIES PERFORMED ON PREVIOUS CULTURE WITHIN THE LAST 5 DAYS.    Report Status 10/11/2015 FINAL  Final  Urine culture     Status: None   Collection Time: 10/08/15 11:00 AM  Result Value Ref Range Status   Specimen Description URINE, CLEAN CATCH  Final   Special Requests NONE  Final   Culture MULTIPLE SPECIES PRESENT, SUGGEST RECOLLECTION  Final   Report Status 10/09/2015 FINAL  Final  Respiratory virus panel     Status: None   Collection Time: 10/08/15  4:00 PM  Result Value Ref Range Status   Respiratory  Syncytial Virus A Negative Negative Final   Respiratory Syncytial Virus B Negative Negative Final   Influenza A Negative Negative Final   Influenza B Negative Negative Final   Parainfluenza 1 Negative Negative Final   Parainfluenza 2 Negative Negative Final   Parainfluenza 3 Negative Negative Final   Metapneumovirus Negative Negative Final   Rhinovirus Negative Negative Final   Adenovirus Negative Negative Final    Comment: (NOTE) Performed At: [facility] [address] [place], [place] [id number] Lindon Romp MD [id number]   MRSA PCR Screening     Status: None   Collection  Time: 10/08/15  7:17 PM  Result Value Ref Range Status   MRSA by PCR NEGATIVE NEGATIVE Final    Comment:        The GeneXpert MRSA Assay (FDA approved for NASAL specimens only), is one component of a comprehensive MRSA colonization surveillance program. It is not intended to diagnose MRSA infection nor to guide or monitor treatment for MRSA infections.      Scheduled Meds:  Scheduled Meds: . antiseptic oral rinse  7 mL Mouth Rinse BID  . arformoterol  15 mcg Nebulization BID  . budesonide (PULMICORT) nebulizer solution  0.25 mg Nebulization BID  . cefTRIAXone (ROCEPHIN)  IV  2 g Intravenous Q24H  . enoxaparin (LOVENOX) injection  40 mg Subcutaneous Q24H  . pantoprazole  40 mg Oral Daily  . predniSONE  20 mg Oral Q breakfast    Time spent on care of this patient: 8 mins   Barton Dubois , MD  [phone] [facility] Office  [phone]  If 7PM-7AM, please contact night-coverage [email or web address] Password [email or web address] 10/12/2015, 5:46 PM   LOS: 4 days

## 2015-10-12 NOTE — Progress Notes (Signed)
Occupational Therapy Treatment Patient Details Name: Angel Hebert MRN: [id number] DOB: 07/02/1956 Today's Date: 10/12/2015    History of present illness 60 yo female admitted with LLL PNA effusion and elevated lactate 6. Severe sepsis due to strep LLL and sinus tachycardia. PMH: asthma, HTN, GERD   OT comments  Patient progressing towards OT goals, continue plan of care for now. Pt currently functioning at an overall close supervision to min guard level. IF pt unable to go SNF, she will need 24/7 initially due to decreased dynamic standing balance to safety perform ADLs and functional mobility on her own.    Follow Up Recommendations  SNF;Supervision/Assistance - 24 hour    Equipment Recommendations  3 in 1 bedside comode    Recommendations for Other Services  None at this time  Precautions / Restrictions Precautions Precautions: Fall Precaution Comments: watch HR and O2 Restrictions Weight Bearing Restrictions: No       Mobility Bed Mobility Overal bed mobility: Independent  Transfers Overall transfer level: Needs assistance Equipment used: None Transfers: Sit to/from Stand Sit to Stand: Min guard General transfer comment: min guard for safety, no LOB during this OT treat    Balance Overall balance assessment: Needs assistance Sitting-balance support: No upper extremity supported;Feet supported Sitting balance-Leahy Scale: Good     Standing balance support: No upper extremity supported;During functional activity Standing balance-Leahy Scale: Fair   ADL Overall ADL's : Needs assistance/impaired     Grooming: Wash/dry hands;Min Licensed conveyancer: Min guard;Ambulation;Comfort height toilet;Grab bars Functional mobility during ADLs: Min guard;Cueing for safety General ADL Comments: Pt overall min guard with functional ambulation, close to being supervision.      Cognition   Behavior During Therapy: Flat affect Overall Cognitive Status:  Impaired/Different from baseline Area of Impairment: Attention;Safety/judgement;Awareness;Problem solving   Current Attention Level: Selective    Following Commands: Follows one step commands consistently Safety/Judgement:  (pt able to state the need for assistance prior to getting OOB) Awareness: Emergent Problem Solving: Slow processing General Comments: Pt able to problem solve during ADL tasks today, no incontinence as patient voided in toilet.                  Pertinent Vitals/ Pain       Pain Assessment: Faces Pain Score: 10-Worst pain ever Faces Pain Scale: Hurts even more Pain Location: left side of low back Pain Descriptors / Indicators: Spasm Pain Intervention(s): Monitored during session   Frequency Min 2X/week     Progress Toward Goals  OT Goals(current goals can now befound in the care plan section)  Progress towards OT goals: Progressing toward goals  Acute Rehab OT Goals Patient Stated Goal: none stated OT Goal Formulation: With patient Time For Goal Achievement: 10/25/15 Potential to Achieve Goals: Good  Plan Discharge plan remains appropriate       End of Session Equipment Utilized During Treatment: Gait belt   Activity Tolerance Patient tolerated treatment well   Patient Left in bed;with call bell/phone within reach     Time: 1517-1530 OT Time Calculation (min): 13 min  Charges: OT General Charges $OT Visit: 1 Procedure OT Treatments $Self Care/Home Management : 8-22 mins  Chrys Racer , MS, OTR/L, CLT Pager: [phone]  10/12/2015, 3:53 PM

## 2015-10-12 NOTE — Progress Notes (Signed)
Physical Therapy Treatment Patient Details Name: Angel Hebert MRN: [id number] DOB: 07/25/1956 Today's Date: Oct 23, 2015    History of Present Illness 60 yo female admitted with LLL PNA effusion and elevated lactate 6. Severe sepsis due to strep LLL and sinus tachycardia. PMH: asthma, HTN, GERD    PT Comments    Pt performed increased gait distance.  Balance deficits remains requiring cues for B weight shifting and reciprocal armswing during gait training to improve balance.    Follow Up Recommendations  Supervision/Assistance - 24 hour;SNF     Equipment Recommendations   (TBD)    Recommendations for Other Services       Precautions / Restrictions Precautions Precautions: Fall Precaution Comments: watch HR and O2 Restrictions Weight Bearing Restrictions: No    Mobility  Bed Mobility Overal bed mobility: Independent                Transfers Overall transfer level: Needs assistance   Transfers: Sit to/from Stand Sit to Stand: Min guard         General transfer comment: from bed to standing.  Pt required cues for hand placement.    Ambulation/Gait Ambulation/Gait assistance: Min assist Ambulation Distance (Feet): 270 Feet Assistive device: None Gait Pattern/deviations: Step-through pattern;Decreased step length - left;Narrow base of support;Staggering left;Staggering right Gait velocity: decreased   General Gait Details: instability noted with ambulation, multiple LOB during higher level ambulation activity remains   Stairs            Wheelchair Mobility    Modified Rankin (Stroke Patients Only)       Balance Overall balance assessment: Needs assistance           Standing balance-Leahy Scale: Poor                      Cognition Arousal/Alertness: Awake/alert Behavior During Therapy: Flat affect Overall Cognitive Status: Impaired/Different from baseline Area of Impairment: Safety/judgement;Awareness;Problem solving;Following  commands;Attention;Orientation   Current Attention Level: Focused   Following Commands: Follows one step commands consistently Safety/Judgement: Decreased awareness of safety Awareness: Intellectual Problem Solving: Slow processing;Difficulty sequencing      Exercises      General Comments        Pertinent Vitals/Pain Pain Assessment: 0-10 Pain Score: 10-Worst pain ever Pain Location: Lside of low back Pain Descriptors / Indicators: Cramping Pain Intervention(s): Monitored during session    Home Living                      Prior Function            PT Goals (current goals can now be found in the care plan section) Acute Rehab PT Goals Potential to Achieve Goals: Good Progress towards PT goals: Progressing toward goals    Frequency  Min 3X/week    PT Plan      Co-evaluation             End of Session Equipment Utilized During Treatment: Gait belt Activity Tolerance: Patient limited by fatigue Patient left: in bed;with call bell/phone within reach;with family/visitor present     Time: [id number] PT Time Calculation (min) (ACUTE ONLY): 18 min  Charges:  $Gait Training: 8-22 mins                    G Codes:      Cristela Blue 23-Oct-2015, 12:11 PM  Governor Rooks, PTA pager [phone]

## 2015-10-12 NOTE — Progress Notes (Signed)
CSW consulted regarding SNF placement and homelessness. Patient expressed understanding that she did not qualify for SNF placement at this time and patient expressed being unable to pay privately. Patient reports currently living with her boyfriend/neighbor and that her sister brought her to the hospital. Patient is interested in finding her own housing and she has submitted a Medicaid application. CSW provided her w/ [facility] handout for emergency shelters and completed VI-SPDAT with patient.   CSW signing off.  Percell Locus Shamona Wirtz LCSWA [phone]

## 2015-10-12 NOTE — Progress Notes (Signed)
UR COMPLETED  

## 2015-10-13 DIAGNOSIS — R5381 Other malaise: Secondary | ICD-10-CM

## 2015-10-13 LAB — BASIC METABOLIC PANEL
Anion gap: 12 (ref 5–15)
BUN: <5 mg/dL — ABNORMAL LOW (ref 6–20)
CALCIUM: 8 mg/dL — AB (ref 8.9–10.3)
CO2: 27 mmol/L (ref 22–32)
CREATININE: 0.37 mg/dL — AB (ref 0.44–1.00)
Chloride: 98 mmol/L — ABNORMAL LOW (ref 101–111)
Glucose, Bld: 65 mg/dL (ref 65–99)
Potassium: 2.7 mmol/L — CL (ref 3.5–5.1)
SODIUM: 137 mmol/L (ref 135–145)

## 2015-10-13 LAB — CBC
HCT: 22.3 % — ABNORMAL LOW (ref 36.0–46.0)
HEMOGLOBIN: 7.2 g/dL — AB (ref 12.0–15.0)
MCH: 24.8 pg — ABNORMAL LOW (ref 26.0–34.0)
MCHC: 32.3 g/dL (ref 30.0–36.0)
MCV: 76.9 fL — ABNORMAL LOW (ref 78.0–100.0)
Platelets: 560 10*3/uL — ABNORMAL HIGH (ref 150–400)
RBC: 2.9 MIL/uL — ABNORMAL LOW (ref 3.87–5.11)
RDW: 23.3 % — AB (ref 11.5–15.5)
WBC: 13.5 10*3/uL — AB (ref 4.0–10.5)

## 2015-10-13 MED ORDER — POTASSIUM CHLORIDE CRYS ER 20 MEQ PO TBCR
40.0000 meq | EXTENDED_RELEASE_TABLET | ORAL | Status: AC
  Administered 2015-10-13 (×3): 40 meq via ORAL
  Filled 2015-10-13 (×3): qty 2

## 2015-10-13 NOTE — Progress Notes (Signed)
PROGRESS NOTE  Angel Hebert [id number] DOB: [birth year] DOA: 10/08/2015 PCP: Lance Bosch, NP  Admit HPI / Brief Narrative: 60 yo female never smoker with asthma presented to ER on 10/08/15 with a 2 day hx of cough, congestion, fever/chills and sob. CXR showed a large LLL PNA and effusion, elevated lactate ~6. She had tachypnea and was hypoxic. Improved with NRB. She was given IVF bolus with repeat lactate not showing significant improvement. WBC nml. ABG showed normal pH and no Co2 retention.   HPI/Subjective: The patient states she feels much better overall.  reporting cough is improving and denies CP and fever. AAOX3. Reports feeling weak and is in agreement with SNF for rehab.  Assessment/Plan:  Severe Sepsis due to Strep pneumo LLL > RLL CAP with left effusion  -Lactate WNL now. -afebrile and WBC's continue trending down -patient weak and physically deconditioned; but overall better and improved  -good O2 sat on RA  Strep pneumo Bacteremia 2/2 blood cx  -cont focused abx coverage and follow clinically  -non toxic currently  -plan is to complete at least 6 days IV therapy and total of 14 days of antibiotics, with transition to PO starting on 2/11  Sinus Tachycardia  -Likely due to combination of infection as well as mild adrenal insuff -resume home prednisone after initial hydrocortisone burst  -HR stable for the last 24 hours; will d/c telemetry   Hypokalemia  -Due to poor intake/malnutrition - Mg normal  -will replete and follow trend   Mild hyponatremia -Follow w/ hydration and resumption of steroids   Asthma/nasal polyps -Well compensated at present  -continue home bronchodilators regimen  -continue chronic prednisone   HTN  -BP controlled at this time - follow trend   GERD -continue PPI   Transaminitis -Likely shock liver v/s direct irritation from RLL PNA - improving   Anemia  -Likely due to chronic poor nutrition -Hgb trending down w/ hydration -  no evidence of blood loss - check anemia panel - no IV Fe for now given systemic infection/bacteremia. -will transfuse as needed   Code Status: FULL Family Communication: no family present at time of exam today  Disposition Plan: stable for transfer to tele bed - f/u Hgb - f/u CXR in AM - cont IV abx   Consultants: PCCM  Procedures: none  Antibiotics: Azithromycin 2/5 > 2/8 Rocephin 2/5 >  DVT prophylaxis: lovenox   Objective: Blood pressure 142/80, pulse 92, temperature 98 F (36.7 C), temperature source Oral, resp. rate 20, height 5\' 7"  (1.702 m), weight 59.058 kg (130 lb 3.2 oz), SpO2 100 %.  Intake/Output Summary (Last 24 hours) at 10/13/15 1744 Last data filed at 10/13/15 1153  Gross per 24 hour  Intake      0 ml  Output   1650 ml  Net  -1650 ml   Exam: General: No acute respiratory; afebrile and with improvement in coughing spells. Reports low back pain is also better. Lungs: improve air movement, no wheezing, positive rhonchi  Cardiovascular: RRR- no gallup or rub or M Abdomen: Nontender, nondistended, soft, bowel sounds positive, no rebound, no ascites, no appreciable mass Extremities: No significant cyanosis, clubbing, edema bilateral lower extremities  Data Reviewed:  Basic Metabolic Panel:  Recent Labs Lab 10/08/15 0906 10/09/15 0504 10/10/15 0518 10/12/15 0539 10/13/15 0631  NA 133* 137 133* 134* 137  K 2.7* 2.5* 3.5 3.3* 2.7*  CL 91* 100* 100* 96* 98*  CO2 22 22 23 26 27   GLUCOSE 75 145* 82  96 65  BUN 19 10 7 6  <5*  CREATININE 0.88 0.78 0.49 0.39* 0.37*  CALCIUM 9.0 7.8* 7.9* 7.6* 8.0*  MG  --   --  2.0  --   --     CBC:  Recent Labs Lab 10/08/15 0906 10/09/15 0504 10/10/15 0518 10/11/15 1542 10/12/15 0539 10/13/15 0631  WBC 5.2 17.1* 19.7* 18.2* 16.6* 13.5*  NEUTROABS 4.7  --   --   --   --   --   HGB 9.4* 7.9* 7.3* 7.8* 7.6* 7.2*  HCT 28.6* 23.9* 22.7* 23.9* 22.4* 22.3*  MCV 80.6 79.9 78.5 77.6* 78.6 76.9*  PLT 366 396 390 490*  435* 560*   Liver Function Tests:  Recent Labs Lab 10/08/15 0906 10/09/15 0504 10/10/15 0518 10/12/15 0539  AST 405* 213* 90* 148*  ALT 54 36 25 59*  ALKPHOS 64 52 81 111  BILITOT 1.2 1.4* 1.0 1.1  PROT 7.2 5.6* 6.0* 5.7*  ALBUMIN 2.3* 1.7* 1.6* 1.5*   Cardiac Enzymes:  Recent Labs Lab 10/08/15 0939  TROPONINI <0.03    Recent Results (from the past 240 hour(s))  Blood Culture (routine x 2)     Status: None   Collection Time: 10/08/15  9:57 AM  Result Value Ref Range Status   Specimen Description BLOOD RIGHT HAND  Final   Special Requests BOTTLES DRAWN AEROBIC ONLY 5CCS  Final   Culture  Setup Time   Final    AEROBIC BOTTLE ONLY GRAM POSITIVE COCCI IN PAIRS IN CHAINS CRITICAL RESULT CALLED TO, READ BACK BY AND VERIFIED WITH: [facility] 2033 10/08/15 M.CAMPBELL    Culture STREPTOCOCCUS PNEUMONIAE  Final   Report Status 10/11/2015 FINAL  Final   Organism ID, Bacteria STREPTOCOCCUS PNEUMONIAE  Final      Susceptibility   Streptococcus pneumoniae - MIC*    ERYTHROMYCIN <=0.12 SENSITIVE Sensitive     LEVOFLOXACIN 0.5 SENSITIVE Sensitive     VANCOMYCIN 0.5 SENSITIVE Sensitive     PENICILLIN <=0.06 SENSITIVE Sensitive     CEFTRIAXONE <=0.12 SENSITIVE Sensitive     * STREPTOCOCCUS PNEUMONIAE  Blood Culture (routine x 2)     Status: None   Collection Time: 10/08/15 10:00 AM  Result Value Ref Range Status   Specimen Description BLOOD LEFT FOREARM  Final   Special Requests BOTTLES DRAWN AEROBIC ONLY 5CCS  Final   Culture  Setup Time   Final    AEROBIC BOTTLE ONLY GRAM POSITIVE COCCI IN PAIRS IN CHAINS CRITICAL RESULT CALLED TO, READ BACK BY AND VERIFIED WITH: [facility] 2033 10/08/15 M.CAMPBELL    Culture   Final    STREPTOCOCCUS PNEUMONIAE SUSCEPTIBILITIES PERFORMED ON PREVIOUS CULTURE WITHIN THE LAST 5 DAYS.    Report Status 10/11/2015 FINAL  Final  Urine culture     Status: None   Collection Time: 10/08/15 11:00 AM  Result Value Ref Range Status   Specimen  Description URINE, CLEAN CATCH  Final   Special Requests NONE  Final   Culture MULTIPLE SPECIES PRESENT, SUGGEST RECOLLECTION  Final   Report Status 10/09/2015 FINAL  Final  Respiratory virus panel     Status: None   Collection Time: 10/08/15  4:00 PM  Result Value Ref Range Status   Respiratory Syncytial Virus A Negative Negative Final   Respiratory Syncytial Virus B Negative Negative Final   Influenza A Negative Negative Final   Influenza B Negative Negative Final   Parainfluenza 1 Negative Negative Final   Parainfluenza 2 Negative Negative Final   Parainfluenza  3 Negative Negative Final   Metapneumovirus Negative Negative Final   Rhinovirus Negative Negative Final   Adenovirus Negative Negative Final    Comment: (NOTE) Performed At: [facility] [place], [place] [id number] Lindon Romp MD [id number]   MRSA PCR Screening     Status: None   Collection Time: 10/08/15  7:17 PM  Result Value Ref Range Status   MRSA by PCR NEGATIVE NEGATIVE Final    Comment:        The GeneXpert MRSA Assay (FDA approved for NASAL specimens only), is one component of a comprehensive MRSA colonization surveillance program. It is not intended to diagnose MRSA infection nor to guide or monitor treatment for MRSA infections.      Scheduled Meds:  Scheduled Meds: . antiseptic oral rinse  7 mL Mouth Rinse BID  . arformoterol  15 mcg Nebulization BID  . budesonide (PULMICORT) nebulizer solution  0.25 mg Nebulization BID  . cefTRIAXone (ROCEPHIN)  IV  2 g Intravenous Q24H  . enoxaparin (LOVENOX) injection  40 mg Subcutaneous Q24H  . pantoprazole  40 mg Oral Daily  . potassium chloride  40 mEq Oral Q4H  . predniSONE  20 mg Oral Q breakfast    Time spent on care of this patient: 76 mins   Barton Dubois , MD  [phone] [facility] Office  [phone]  If 7PM-7AM, please contact night-coverage [email or web address] Password [email or web address] 10/13/2015, 5:44 PM   LOS:  5 days

## 2015-10-13 NOTE — Progress Notes (Signed)
Physical Therapy Treatment Patient Details Name: Angel Hebert MRN: [id number] DOB: 23-Dec-1955 Today's Date: 10/13/2015    History of Present Illness 60 yo female admitted with LLL PNA effusion and elevated lactate 6. Severe sepsis due to strep LLL and sinus tachycardia. PMH: asthma, HTN, GERD    PT Comments    Patient able to ambulate 170 feet with min guard assistance. Noted instability throughout session but no gross loss of balance. PT to continue to follow and progress as tolerated.   Follow Up Recommendations  Supervision/Assistance - 24 hour;SNF     Equipment Recommendations  None recommended by PT;Other (comment) (to be addressed at next level of care)    Recommendations for Other Services       Precautions / Restrictions Precautions Precautions: Fall Precaution Comments: watch HR and O2 Restrictions Weight Bearing Restrictions: No    Mobility  Bed Mobility Overal bed mobility: Independent             General bed mobility comments: sit to supine  Transfers Overall transfer level: Needs assistance Equipment used: None Transfers: Sit to/from Stand Sit to Stand: Min guard         General transfer comment: for safety  Ambulation/Gait Ambulation/Gait assistance: Min guard Ambulation Distance (Feet): 170 Feet Assistive device: None Gait Pattern/deviations: Step-through pattern Gait velocity: decreased   General Gait Details: noted instability with gait but, no gross loss of balance.    Stairs            Wheelchair Mobility    Modified Rankin (Stroke Patients Only)       Balance Overall balance assessment: Needs assistance Sitting-balance support: No upper extremity supported Sitting balance-Leahy Scale: Good     Standing balance support: No upper extremity supported Standing balance-Leahy Scale: Fair Standing balance comment: mild instability dynamically                    Cognition Arousal/Alertness: Awake/alert Behavior  During Therapy: Flat affect Overall Cognitive Status: Within Functional Limits for tasks assessed         Following Commands: Follows one step commands consistently            Exercises      General Comments        Pertinent Vitals/Pain Pain Assessment: Faces Faces Pain Scale: Hurts a little bit Pain Location: low back Pain Descriptors / Indicators: Sore Pain Intervention(s): Limited activity within patient's tolerance;Monitored during session    Home Living                      Prior Function            PT Goals (current goals can now be found in the care plan section) Acute Rehab PT Goals Patient Stated Goal: none stated PT Goal Formulation: With patient Time For Goal Achievement: 10/25/15 Potential to Achieve Goals: Good    Frequency  Min 3X/week    PT Plan Current plan remains appropriate    Co-evaluation             End of Session Equipment Utilized During Treatment: Gait belt Activity Tolerance: Patient limited by fatigue Patient left: in bed;with call bell/phone within reach     Time: [id number] PT Time Calculation (min) (ACUTE ONLY): 11 min  Charges:  $Gait Training: 8-22 mins                    G Codes:      Cassell Clement, PT,  CSCS Pager [phone] Office [phone]  10/13/2015, 3:58 PM

## 2015-10-13 NOTE — Progress Notes (Signed)
Pt a/o, no c/o pain, pt resting comfortably, pt stable 

## 2015-10-14 DIAGNOSIS — L8990 Pressure ulcer of unspecified site, unspecified stage: Secondary | ICD-10-CM | POA: Insufficient documentation

## 2015-10-14 LAB — BASIC METABOLIC PANEL
Anion gap: 11 (ref 5–15)
CALCIUM: 8.1 mg/dL — AB (ref 8.9–10.3)
CHLORIDE: 103 mmol/L (ref 101–111)
CO2: 24 mmol/L (ref 22–32)
CREATININE: 0.55 mg/dL (ref 0.44–1.00)
GFR calc Af Amer: >60 mL/min (ref >60)
GFR calc non Af Amer: >60 mL/min (ref >60)
Glucose, Bld: 100 mg/dL — ABNORMAL HIGH (ref 65–99)
Potassium: 4.1 mmol/L (ref 3.5–5.1)
SODIUM: 138 mmol/L (ref 135–145)

## 2015-10-14 MED ORDER — LEVOFLOXACIN 750 MG PO TABS
750.0000 mg | ORAL_TABLET | Freq: Every day | ORAL | Status: DC
Start: 2015-10-14 — End: 2015-10-15
  Administered 2015-10-14 – 2015-10-15 (×2): 750 mg via ORAL
  Filled 2015-10-14 (×2): qty 1

## 2015-10-14 MED ORDER — POLYETHYLENE GLYCOL 3350 17 G PO PACK
17.0000 g | PACK | Freq: Every day | ORAL | Status: DC
  Administered 2015-10-14 – 2015-10-15 (×2): 17 g via ORAL
  Filled 2015-10-14 (×2): qty 1

## 2015-10-14 MED ORDER — SENNA 8.6 MG PO TABS
1.0000 | ORAL_TABLET | Freq: Two times a day (BID) | ORAL | Status: DC
  Administered 2015-10-14 – 2015-10-15 (×2): 8.6 mg via ORAL
  Filled 2015-10-14 (×2): qty 1

## 2015-10-14 NOTE — Progress Notes (Addendum)
PROGRESS NOTE  Angel Hebert [id number] DOB: Feb 21, 1956 DOA: 10/08/2015 PCP: Lance Bosch, NP  Admit HPI / Brief Narrative: 60 yo female never smoker with asthma presented to ER on 10/08/15 with a 2 day hx of cough, congestion, fever/chills and sob. CXR showed a large LLL PNA and effusion, elevated lactate ~6. She had tachypnea and was hypoxic. Improved with NRB. She was given IVF bolus with repeat lactate not showing significant improvement. WBC nml. ABG showed normal pH and no Co2 retention.   HPI/Subjective: The patient states she feels much better overall.  reporting cough is much improved. Denies CP and fever. AAOX3. Reports feeling weak and is in agreement with discharge home with home services; patient was not approved for SNF.  Assessment/Plan: Severe Sepsis due to Strep pneumo LLL > RLL CAP with left effusion  -Lactate WNL now. -afebrile and WBC's continue trending down -patient weak and physically deconditioned; but overall better and steadily improving -didn't qualify for SNF (lack of insurance basically); will set up [facility] services and will received assistance/care from family members and boyfriend -good O2 sat on RA  Strep pneumo Bacteremia 2/2 blood cx  -cont focused abx coverage; will transitioned to levaquin PO now -non toxic currently  -has completed 6 days of IV therapy and now will transitioned to PO Levaquin for a total of 14 days of antibiotics.  Sinus Tachycardia  -Likely due to combination of infection as well as mild adrenal insuff -resume home prednisone after initial hydrocortisone burst  -HR has now remains stable  Hypokalemia  -Due to poor intake/malnutrition - Mg normal  -repleted and WNL now  Mild hyponatremia -associated with dehydration and PNA -resolved with IVF's, steroids resumption and antibiotic therapy   Asthma/nasal polyps -Well compensated at present  -continue home bronchodilators regimen  -continue chronic prednisone   HTN  -BP  controlled at this time - follow trend   GERD -continue PPI   Transaminitis -Likely shock liver v/s direct irritation from RLL PNA - improving/resolving    Anemia  -Likely due to chronic poor nutrition -Hgb trending down w/ hydration - no evidence of blood loss  -will transfuse as needed  -niferex at discharge  Pressure ulcer -will continue barrier cream and preventive measures -patient is now more mobile  Code Status: FULL Family Communication: no family present at time of exam today  Disposition Plan: stable for transition to PO antibiotics; will set up [facility] services and most likely home in am  Consultants: PCCM  Procedures: none  Antibiotics: Azithromycin 2/5 > 2/8 Rocephin 2/5 >  DVT prophylaxis: lovenox   Objective: Blood pressure 124/72, pulse 100, temperature 98.4 F (36.9 C), temperature source Oral, resp. rate 30, height 5\' 7"  (1.702 m), weight 59.194 kg (130 lb 8 oz), SpO2 100 %.  Intake/Output Summary (Last 24 hours) at 10/14/15 1749 Last data filed at 10/14/15 0655  Gross per 24 hour  Intake   3415 ml  Output   1850 ml  Net   1565 ml   Exam: General: No acute respiratory; afebrile and with improvement in coughing spells. Reports low back pain is also better and is feeling stronger. Will transition to PO abx's today; home in am most likely  Lungs: improve air movement, no wheezing, positive rhonchi  Cardiovascular: RRR- no gallup or rub or M Abdomen: Nontender, nondistended, soft, bowel sounds positive, no rebound, no ascites, no appreciable mass Extremities: No significant cyanosis, clubbing, edema bilateral lower extremities  Data Reviewed:  Basic Metabolic Panel:  Recent Labs Lab 10/09/15 0504 10/10/15 0518 10/12/15 0539 10/13/15 0631 10/14/15 0937  NA 137 133* 134* 137 138  K 2.5* 3.5 3.3* 2.7* 4.1  CL 100* 100* 96* 98* 103  CO2 22 23 26 27 24   GLUCOSE 145* 82 96 65 100*  BUN 10 7 6  <5* <5*  CREATININE 0.78 0.49 0.39* 0.37* 0.55   CALCIUM 7.8* 7.9* 7.6* 8.0* 8.1*  MG  --  2.0  --   --   --     CBC:  Recent Labs Lab 10/08/15 0906 10/09/15 0504 10/10/15 0518 10/11/15 1542 10/12/15 0539 10/13/15 0631  WBC 5.2 17.1* 19.7* 18.2* 16.6* 13.5*  NEUTROABS 4.7  --   --   --   --   --   HGB 9.4* 7.9* 7.3* 7.8* 7.6* 7.2*  HCT 28.6* 23.9* 22.7* 23.9* 22.4* 22.3*  MCV 80.6 79.9 78.5 77.6* 78.6 76.9*  PLT 366 396 390 490* 435* 560*   Liver Function Tests:  Recent Labs Lab 10/08/15 0906 10/09/15 0504 10/10/15 0518 10/12/15 0539  AST 405* 213* 90* 148*  ALT 54 36 25 59*  ALKPHOS 64 52 81 111  BILITOT 1.2 1.4* 1.0 1.1  PROT 7.2 5.6* 6.0* 5.7*  ALBUMIN 2.3* 1.7* 1.6* 1.5*   Cardiac Enzymes:  Recent Labs Lab 10/08/15 0939  TROPONINI <0.03    Recent Results (from the past 240 hour(s))  Blood Culture (routine x 2)     Status: None   Collection Time: 10/08/15  9:57 AM  Result Value Ref Range Status   Specimen Description BLOOD RIGHT HAND  Final   Special Requests BOTTLES DRAWN AEROBIC ONLY 5CCS  Final   Culture  Setup Time   Final    AEROBIC BOTTLE ONLY GRAM POSITIVE COCCI IN PAIRS IN CHAINS CRITICAL RESULT CALLED TO, READ BACK BY AND VERIFIED WITH: [facility] 2033 10/08/15 M.CAMPBELL    Culture STREPTOCOCCUS PNEUMONIAE  Final   Report Status 10/11/2015 FINAL  Final   Organism ID, Bacteria STREPTOCOCCUS PNEUMONIAE  Final      Susceptibility   Streptococcus pneumoniae - MIC*    ERYTHROMYCIN <=0.12 SENSITIVE Sensitive     LEVOFLOXACIN 0.5 SENSITIVE Sensitive     VANCOMYCIN 0.5 SENSITIVE Sensitive     PENICILLIN <=0.06 SENSITIVE Sensitive     CEFTRIAXONE <=0.12 SENSITIVE Sensitive     * STREPTOCOCCUS PNEUMONIAE  Blood Culture (routine x 2)     Status: None   Collection Time: 10/08/15 10:00 AM  Result Value Ref Range Status   Specimen Description BLOOD LEFT FOREARM  Final   Special Requests BOTTLES DRAWN AEROBIC ONLY 5CCS  Final   Culture  Setup Time   Final    AEROBIC BOTTLE ONLY GRAM POSITIVE  COCCI IN PAIRS IN CHAINS CRITICAL RESULT CALLED TO, READ BACK BY AND VERIFIED WITH: [facility] 2033 10/08/15 M.CAMPBELL    Culture   Final    STREPTOCOCCUS PNEUMONIAE SUSCEPTIBILITIES PERFORMED ON PREVIOUS CULTURE WITHIN THE LAST 5 DAYS.    Report Status 10/11/2015 FINAL  Final  Urine culture     Status: None   Collection Time: 10/08/15 11:00 AM  Result Value Ref Range Status   Specimen Description URINE, CLEAN CATCH  Final   Special Requests NONE  Final   Culture MULTIPLE SPECIES PRESENT, SUGGEST RECOLLECTION  Final   Report Status 10/09/2015 FINAL  Final  Respiratory virus panel     Status: None   Collection Time: 10/08/15  4:00 PM  Result Value Ref Range Status   Respiratory  Syncytial Virus A Negative Negative Final   Respiratory Syncytial Virus B Negative Negative Final   Influenza A Negative Negative Final   Influenza B Negative Negative Final   Parainfluenza 1 Negative Negative Final   Parainfluenza 2 Negative Negative Final   Parainfluenza 3 Negative Negative Final   Metapneumovirus Negative Negative Final   Rhinovirus Negative Negative Final   Adenovirus Negative Negative Final    Comment: (NOTE) Performed At: [facility] [place], [place] [id number] Lindon Romp MD [id number]   MRSA PCR Screening     Status: None   Collection Time: 10/08/15  7:17 PM  Result Value Ref Range Status   MRSA by PCR NEGATIVE NEGATIVE Final    Comment:        The GeneXpert MRSA Assay (FDA approved for NASAL specimens only), is one component of a comprehensive MRSA colonization surveillance program. It is not intended to diagnose MRSA infection nor to guide or monitor treatment for MRSA infections.      Scheduled Meds:  Scheduled Meds: . antiseptic oral rinse  7 mL Mouth Rinse BID  . arformoterol  15 mcg Nebulization BID  . budesonide (PULMICORT) nebulizer solution  0.25 mg Nebulization BID  . enoxaparin (LOVENOX) injection  40 mg Subcutaneous  Q24H  . levofloxacin  750 mg Oral Daily  . pantoprazole  40 mg Oral Daily  . polyethylene glycol  17 g Oral Daily  . predniSONE  20 mg Oral Q breakfast  . senna  1 tablet Oral BID    Time spent on care of this patient: 64 mins   Barton Dubois , MD  [phone] [facility] Office  [phone]  If 7PM-7AM, please contact night-coverage [email or web address] Password [email or web address] 10/14/2015, 5:49 PM   LOS: 6 days

## 2015-10-15 DIAGNOSIS — I10 Essential (primary) hypertension: Secondary | ICD-10-CM | POA: Insufficient documentation

## 2015-10-15 DIAGNOSIS — D509 Iron deficiency anemia, unspecified: Secondary | ICD-10-CM

## 2015-10-15 DIAGNOSIS — R5381 Other malaise: Secondary | ICD-10-CM | POA: Insufficient documentation

## 2015-10-15 LAB — CBC
HEMATOCRIT: 20.4 % — AB (ref 36.0–46.0)
Hemoglobin: 6.5 g/dL — CL (ref 12.0–15.0)
MCH: 25.3 pg — ABNORMAL LOW (ref 26.0–34.0)
MCHC: 31.9 g/dL (ref 30.0–36.0)
MCV: 79.4 fL (ref 78.0–100.0)
PLATELETS: 830 10*3/uL — AB (ref 150–400)
RBC: 2.57 MIL/uL — AB (ref 3.87–5.11)
RDW: 23.4 % — ABNORMAL HIGH (ref 11.5–15.5)
WBC: 12.5 10*3/uL — AB (ref 4.0–10.5)

## 2015-10-15 LAB — PREPARE RBC (CROSSMATCH)

## 2015-10-15 LAB — ABO/RH: ABO/RH(D): B POS

## 2015-10-15 MED ORDER — SODIUM CHLORIDE 0.9 % IV SOLN
Freq: Once | INTRAVENOUS | Status: DC
Start: 2015-10-15 — End: 2015-10-15

## 2015-10-15 MED ORDER — POLYSACCHARIDE IRON COMPLEX 150 MG PO CAPS: 150.0000 mg | ORAL_CAPSULE | Freq: Every day | ORAL | Status: DC

## 2015-10-15 MED ORDER — LEVOFLOXACIN 750 MG PO TABS: 750.0000 mg | ORAL_TABLET | Freq: Every day | ORAL | Status: DC

## 2015-10-15 MED ORDER — BENZONATATE 200 MG PO CAPS: 200.0000 mg | ORAL_CAPSULE | Freq: Three times a day (TID) | ORAL | Status: DC | PRN

## 2015-10-15 MED ORDER — SODIUM CHLORIDE 0.9 % IV SOLN
Freq: Once | INTRAVENOUS | Status: AC
  Administered 2015-10-15: 08:00:00 via INTRAVENOUS

## 2015-10-15 MED ORDER — POLYSACCHARIDE IRON COMPLEX 150 MG PO CAPS
150.0000 mg | ORAL_CAPSULE | Freq: Every day | ORAL | Status: DC
  Administered 2015-10-15: 150 mg via ORAL
  Filled 2015-10-15: qty 1

## 2015-10-15 NOTE — Discharge Summary (Signed)
Physician Discharge Summary  Angel Hebert [id number] DOB: [birth year] DOA: 10/08/2015  PCP: Lance Bosch, NP  Admit date: 10/08/2015 Discharge date: 10/15/2015  Time spent: 40 minutes  Recommendations for Outpatient Follow-up:  Repeat BMET to follow electrolytes and renal function Repeat CBC to follow Hgb trend If further low Hgb continue to be appreciated; will need outpatient work up with GI service   Discharge Diagnoses:  Active Problems:   Community acquired pneumonia   CAP (community acquired pneumonia)   Pleural effusion   Sepsis ([place])   Sepsis due to Streptococcus pneumoniae ([place])   Bacteremia   Pressure ulcer iron deficiency anemia   Discharge Condition: stable and improved. Patient discharge home with home health services and with instructions to follow up with PCP in 10 days.  Diet recommendation: heart healthy diet   Filed Weights   10/13/15 0513 10/14/15 0451 10/15/15 0655  Weight: 59.058 kg (130 lb 3.2 oz) 59.194 kg (130 lb 8 oz) 56.201 kg (123 lb 14.4 oz)    History of present illness:  60 yo female never smoker with asthma presented to ER on 10/08/15 with a 2 day hx of cough, congestion , feverish, chills and sob . CXR showed a large LLL PNA and effusion, elevated lactate ~6 . She had tachypnea and was hypoxic . Improved with NRB. She was given IVF bolus with repeat lactate not showing significant improvement. ABG showed normal pH and no Co2 retention. She is talking comfortable with NRB on in room w/ normal b/p. No active wheezing. She did not receive flu shot this season. She has very poor dentition. Patient endorses been compliant with Advair. Had friend that was sick recently that she was exposed to . She denies chest pain, orthopnea, leg swelling or hemoptysis .   Hospital Course:  Severe Sepsis due to Strep pneumo LLL > RLL CAP with left effusion  -Lactate WNL now. -Patient has remained afebrile and WBC's continue trending down -patient weak and physically  deconditioned; but overall better and steadily improving -didn't qualify for SNF (lack of insurance basically); will set up [facility] services at discharge and she will received assistance/care from family members and boyfriend -good O2 sat on RA  Strep pneumo Bacteremia  -2/2 positive blood cx  -cont focused abx coverage; will transitioned to levaquin PO now and complete treatment. -non toxic at discharge and able to tolerate PO medications and diet. -has completed 6 days of IV therapy and now will transitioned to PO Levaquin for a total of 14 days of antibiotics.  Sinus Tachycardia  -Likely due to combination of infection as well as mild adrenal insuff -resume home prednisone after initial hydrocortisone burst  -HR has remains stable  Hypokalemia  -Due to poor intake/malnutrition - Mg normal  -repleted and WNL at discharge  Mild hyponatremia -associated with dehydration and PNA -resolved with IVF's, steroids resumption and antibiotic therapy   Asthma/nasal polyps -Well compensated at present  -continue home bronchodilators regimen  -continue chronic prednisone   HTN  -BP controlled at this time - follow trend   GERD -continue PPI   Transaminitis -Likely shock liver v/s direct irritation from RLL PNA - improving/resolving   Anemia  -Likely due to chronic poor nutrition -Hgb trending down w/ hydration - no evidence of blood loss  -required 4 units of PRBC's during whole hospitalization  -niferex at discharge prescribed  -CBC to follow Hgb trend will be needed during follow visit  Pressure ulcer -will continue barrier cream and preventive measures -patient  is now more mobile  Procedures:  See below for x-ray reports   Consultations:  PCCM  Discharge Exam: Filed Vitals:   10/15/15 0931 10/15/15 1119  BP: 114/81 140/74  Pulse: 107 84  Temp: 98.3 F (36.8 C) 98.4 F (36.9 C)  Resp: 26 24   General: No acute respiratory; afebrile and with improvement  in coughing spells. Reports low back pain is also better and is feeling stronger. Will transition to PO abx's today; home in am most likely  Lungs: improve air movement, no wheezing, positive rhonchi  Cardiovascular: RRR- no gallup or rub or M Abdomen: Nontender, nondistended, soft, bowel sounds positive, no rebound, no ascites, no appreciable mass Extremities: No significant cyanosis, clubbing, edema bilateral lower extremities  Discharge Instructions   Discharge Instructions    Diet - low sodium heart healthy    Complete by:  As directed      Discharge instructions    Complete by:  As directed   Avoid prolonged exposure to cold Stop smoking and Avoid second hand smoking  Take medications as prescribed  Keep yourself well hydrated  Arrange follow up with PCP in 10 days          Current Discharge Medication List    START taking these medications   Details  benzonatate (TESSALON) 200 MG capsule Take 1 capsule (200 mg total) by mouth 3 (three) times daily as needed for cough. Qty: 20 capsule, Refills: 0    iron polysaccharides (NIFEREX) 150 MG capsule Take 1 capsule (150 mg total) by mouth daily. Qty: 30 capsule, Refills: 2    levofloxacin (LEVAQUIN) 750 MG tablet Take 1 tablet (750 mg total) by mouth daily. Qty: 8 tablet, Refills: 0      CONTINUE these medications which have NOT CHANGED   Details  albuterol (PROVENTIL HFA;VENTOLIN HFA) 108 (90 BASE) MCG/ACT inhaler Inhale 1-2 puffs into the lungs every 6 (six) hours as needed for wheezing. Qty: 1 Inhaler, Refills: 2   Associated Diagnoses: Asthma, mild persistent, uncomplicated    atorvastatin (LIPITOR) 10 MG tablet Take 1 tablet (10 mg total) by mouth daily at 6 PM. For cholesterol Qty: 30 tablet, Refills: 5   Associated Diagnoses: HLD (hyperlipidemia)    cetirizine (ZYRTEC) 10 MG tablet Take 1 tablet (10 mg total) by mouth daily. Qty: 30 tablet, Refills: 1   Associated Diagnoses: Asthma, mild persistent,  uncomplicated    fluticasone (FLONASE) 50 MCG/ACT nasal spray Place 2 sprays into both nostrils daily. Qty: 16 g, Refills: 2   Associated Diagnoses: Left nasal polyps    Fluticasone-Salmeterol (ADVAIR) 500-50 MCG/DOSE AEPB Inhale 1 puff into the lungs every 12 (twelve) hours. Qty: 60 each, Refills: 4   Associated Diagnoses: Asthma, mild persistent, uncomplicated    hydrochlorothiazide (HYDRODIURIL) 25 MG tablet Take 0.5 tablets (12.5 mg total) by mouth daily. For blood pressure Qty: 30 tablet, Refills: 5   Associated Diagnoses: Essential hypertension    irbesartan (AVAPRO) 150 MG tablet Take 1 tablet (150 mg total) by mouth daily after breakfast. For blood pressure Qty: 30 tablet, Refills: 5   Associated Diagnoses: Essential hypertension    montelukast (SINGULAIR) 10 MG tablet Take 1 tablet (10 mg total) by mouth at bedtime. Qty: 30 tablet, Refills: 2   Associated Diagnoses: Asthma, mild persistent, uncomplicated    pantoprazole (PROTONIX) 20 MG tablet Take 1 tablet (20 mg total) by mouth daily. Qty: 30 tablet, Refills: 5   Associated Diagnoses: Gastroesophageal reflux disease, esophagitis presence not specified  predniSONE (DELTASONE) 20 MG tablet Take 1 tablet (20 mg total) by mouth daily with breakfast. Qty: 3 tablet, Refills: 0   Associated Diagnoses: Nasal polyps    traZODone (DESYREL) 50 MG tablet Take 1 tablet (50 mg total) by mouth at bedtime. Qty: 30 tablet, Refills: 0   Associated Diagnoses: Insomnia       No Known Allergies Follow-up Information    Follow up with Lance Bosch, NP. Schedule an appointment as soon as possible for a visit in 10 days.   Specialty:  Internal Medicine   Contact information:   [place] [address] [phone]       The results of significant diagnostics from this hospitalization (including imaging, microbiology, ancillary and laboratory) are listed below for reference.    Significant Diagnostic Studies: Ct  Angio Chest Pe W/cm &/or Wo Cm  10/08/2015  CLINICAL DATA:  Acute shortness of breath. EXAM: CT ANGIOGRAPHY CHEST WITH CONTRAST TECHNIQUE: Multidetector CT imaging of the chest was performed using the standard protocol during bolus administration of intravenous contrast. Multiplanar CT image reconstructions and MIPs were obtained to evaluate the vascular anatomy. CONTRAST:  157mL OMNIPAQUE IOHEXOL 350 MG/ML SOLN COMPARISON:  Chest radiograph of same day. FINDINGS: No pneumothorax is noted. Small amount of fluid is noted in the superior portion of the left major fissure. There is nearly complete opacification of the left lower lobe due to pneumonia. Complete obstruction of the left lower lobe bronchus is noted most likely due to mucous plug or inflammatory material. Large right lower lobe pneumonia is noted as well. There is no evidence of pulmonary embolus. There is no evidence of thoracic aortic dissection or aneurysm. Coronary artery calcifications are noted. Visualized portion of upper abdomen is unremarkable. No significant osseous abnormality is noted. Probable lipoma is noted in left posterior chest. Review of the MIP images confirms the above findings. IMPRESSION: No evidence of pulmonary embolus. Complete opacification of the left lower lobe due to pneumonia. Complete obstruction of the left lower lobe bronchus is noted most likely due to mucous plug or inflammatory material. Large right lower lobe pneumonia is noted as well. Probable lipoma seen in left posterior chest. Electronically Signed   By: Marijo Conception, M.D.   On: 10/08/2015 14:32   Dg Chest Port 1 View  10/12/2015  CLINICAL DATA:  Followup of pneumonia.  Pleural effusion. EXAM: PORTABLE CHEST 1 VIEW COMPARISON:  10/10/2015. FINDINGS: Midline trachea. Cardiomegaly accentuated by AP portable technique. Small left pleural effusion is similar. Trace right pleural fluid is not significantly changed. No pneumothorax. No congestive failure. Left  worse than right base airspace disease is not significantly changed. IMPRESSION: No significant change in left worse than right airspace disease and pleural fluid. Likely infection. Recommend radiographic follow-up until clearing, especially of the left lower lobe. Cardiomegaly without congestive failure. Electronically Signed   By: Abigail Miyamoto M.D.   On: 10/12/2015 07:57   Dg Chest Port 1 View  10/10/2015  CLINICAL DATA:  Community-acquired pneumonia, history of asthma and hypertension. EXAM: PORTABLE CHEST 1 VIEW COMPARISON:  Chest x-ray of October 09, 2015. FINDINGS: There remains increased density at the left lung base with obscuration of the hemidiaphragm and portions of the left heart border. On the right there is increased density which partially obscures the hemidiaphragm. The cardiac silhouette where visualized appears top normal in size. The pulmonary vascularity is not engorged. The mediastinum is normal in width. IMPRESSION: Persistent left basilar atelectasis or pneumonia with  moderate-sized pleural effusion. The pleural effusion has slightly increased in volume. On the right increased basilar atelectasis or pneumonia is present. Electronically Signed   By: David  Martinique M.D.   On: 10/10/2015 07:17   Dg Chest Port 1 View  10/09/2015  CLINICAL DATA:  Pneumonia EXAM: PORTABLE CHEST 1 VIEW COMPARISON:  10/08/2015 FINDINGS: Left lower lobe consolidation unchanged. Associated left effusion unchanged. Mild right lower lobe airspace disease also unchanged. Negative for heart failure or edema. IMPRESSION: No significant change bibasilar airspace disease left greater than right. Probable associated left pleural effusion. Electronically Signed   By: Franchot Gallo M.D.   On: 10/09/2015 07:09   Dg Chest Portable 1 View  10/08/2015  CLINICAL DATA:  60 year old female with shortness of breath since last night. Initial encounter. EXAM: PORTABLE CHEST 1 VIEW COMPARISON:  Chest radiographs 07/10/2015 and  earlier. FINDINGS: Portable AP semi upright view at 0934 hours. New moderate size left pleural effusion with dense opacification of the left lung base. Visible mediastinal contours are stable. The right lung appears stable and clear. No superimposed pneumothorax or pulmonary edema. No acute osseous abnormality identified. IMPRESSION: New moderate size left pleural effusion with dense left lung base collapse or consolidation. Electronically Signed   By: Genevie Ann M.D.   On: 10/08/2015 10:32    Microbiology: Recent Results (from the past 240 hour(s))  Blood Culture (routine x 2)     Status: None   Collection Time: 10/08/15  9:57 AM  Result Value Ref Range Status   Specimen Description BLOOD RIGHT HAND  Final   Special Requests BOTTLES DRAWN AEROBIC ONLY 5CCS  Final   Culture  Setup Time   Final    AEROBIC BOTTLE ONLY GRAM POSITIVE COCCI IN PAIRS IN CHAINS CRITICAL RESULT CALLED TO, READ BACK BY AND VERIFIED WITH: [facility] 2033 10/08/15 M.CAMPBELL    Culture STREPTOCOCCUS PNEUMONIAE  Final   Report Status 10/11/2015 FINAL  Final   Organism ID, Bacteria STREPTOCOCCUS PNEUMONIAE  Final      Susceptibility   Streptococcus pneumoniae - MIC*    ERYTHROMYCIN <=0.12 SENSITIVE Sensitive     LEVOFLOXACIN 0.5 SENSITIVE Sensitive     VANCOMYCIN 0.5 SENSITIVE Sensitive     PENICILLIN <=0.06 SENSITIVE Sensitive     CEFTRIAXONE <=0.12 SENSITIVE Sensitive     * STREPTOCOCCUS PNEUMONIAE  Blood Culture (routine x 2)     Status: None   Collection Time: 10/08/15 10:00 AM  Result Value Ref Range Status   Specimen Description BLOOD LEFT FOREARM  Final   Special Requests BOTTLES DRAWN AEROBIC ONLY 5CCS  Final   Culture  Setup Time   Final    AEROBIC BOTTLE ONLY GRAM POSITIVE COCCI IN PAIRS IN CHAINS CRITICAL RESULT CALLED TO, READ BACK BY AND VERIFIED WITH: [facility] 2033 10/08/15 M.CAMPBELL    Culture   Final    STREPTOCOCCUS PNEUMONIAE SUSCEPTIBILITIES PERFORMED ON PREVIOUS CULTURE WITHIN THE LAST  5 DAYS.    Report Status 10/11/2015 FINAL  Final  Urine culture     Status: None   Collection Time: 10/08/15 11:00 AM  Result Value Ref Range Status   Specimen Description URINE, CLEAN CATCH  Final   Special Requests NONE  Final   Culture MULTIPLE SPECIES PRESENT, SUGGEST RECOLLECTION  Final   Report Status 10/09/2015 FINAL  Final  Respiratory virus panel     Status: None   Collection Time: 10/08/15  4:00 PM  Result Value Ref Range Status   Respiratory Syncytial Virus A Negative Negative  Final   Respiratory Syncytial Virus B Negative Negative Final   Influenza A Negative Negative Final   Influenza B Negative Negative Final   Parainfluenza 1 Negative Negative Final   Parainfluenza 2 Negative Negative Final   Parainfluenza 3 Negative Negative Final   Metapneumovirus Negative Negative Final   Rhinovirus Negative Negative Final   Adenovirus Negative Negative Final    Comment: (NOTE) Performed At: [facility] [place], [place] [id number] Lindon Romp MD [id number]   MRSA PCR Screening     Status: None   Collection Time: 10/08/15  7:17 PM  Result Value Ref Range Status   MRSA by PCR NEGATIVE NEGATIVE Final    Comment:        The GeneXpert MRSA Assay (FDA approved for NASAL specimens only), is one component of a comprehensive MRSA colonization surveillance program. It is not intended to diagnose MRSA infection nor to guide or monitor treatment for MRSA infections.      Labs: Basic Metabolic Panel:  Recent Labs Lab 10/09/15 0504 10/10/15 0518 10/12/15 0539 10/13/15 0631 10/14/15 0937  NA 137 133* 134* 137 138  K 2.5* 3.5 3.3* 2.7* 4.1  CL 100* 100* 96* 98* 103  CO2 22 23 26 27 24   GLUCOSE 145* 82 96 65 100*  BUN 10 7 6  <5* <5*  CREATININE 0.78 0.49 0.39* 0.37* 0.55  CALCIUM 7.8* 7.9* 7.6* 8.0* 8.1*  MG  --  2.0  --   --   --    Liver Function Tests:  Recent Labs Lab 10/09/15 0504 10/10/15 0518 10/12/15 0539  AST 213* 90*  148*  ALT 36 25 59*  ALKPHOS 52 81 111  BILITOT 1.4* 1.0 1.1  PROT 5.6* 6.0* 5.7*  ALBUMIN 1.7* 1.6* 1.5*   CBC:  Recent Labs Lab 10/10/15 0518 10/11/15 1542 10/12/15 0539 10/13/15 0631 10/15/15 0359  WBC 19.7* 18.2* 16.6* 13.5* 12.5*  HGB 7.3* 7.8* 7.6* 7.2* 6.5*  HCT 22.7* 23.9* 22.4* 22.3* 20.4*  MCV 78.5 77.6* 78.6 76.9* 79.4  PLT 390 490* 435* 560* 830*    BNP (last 3 results)  Recent Labs  10/08/15 0939  BNP 124.5*    Signed:  Barton Dubois MD.  [facility] 10/15/2015, 3:15 PM

## 2015-10-15 NOTE — Progress Notes (Signed)
UR COMPLETED  

## 2015-10-15 NOTE — Progress Notes (Signed)
CRITICAL VALUE ALERT  Critical value received:  hgb 6.5  Date of notification:  10/15/15  Time of notification:  0450  Critical value read back:Yes.    Nurse who received alert:  cassie  MD notified (1st page):  On call physician Archivist)   Time of first page:  04:52  MD notified (2nd page):  Time of second page:  Responding MD:  Schorr  Time MD responded:  [phone]

## 2015-10-16 LAB — TYPE AND SCREEN
ABO/RH(D): B POS
Antibody Screen: NEGATIVE
Unit division: 0
Unit division: 0

## 2015-10-26 ENCOUNTER — Ambulatory Visit: Payer: Self-pay | Attending: Internal Medicine

## 2015-10-26 ENCOUNTER — Ambulatory Visit (HOSPITAL_BASED_OUTPATIENT_CLINIC_OR_DEPARTMENT_OTHER): Payer: Self-pay | Admitting: Internal Medicine

## 2015-10-26 ENCOUNTER — Encounter: Payer: Self-pay | Admitting: Internal Medicine

## 2015-10-26 VITALS — BP 157/90 | HR 79 | Temp 98.6°F | Resp 15 | Ht 67.0 in | Wt 118.2 lb

## 2015-10-26 DIAGNOSIS — J189 Pneumonia, unspecified organism: Secondary | ICD-10-CM

## 2015-10-26 DIAGNOSIS — D649 Anemia, unspecified: Secondary | ICD-10-CM

## 2015-10-26 DIAGNOSIS — E7800 Pure hypercholesterolemia, unspecified: Secondary | ICD-10-CM | POA: Insufficient documentation

## 2015-10-26 DIAGNOSIS — J45909 Unspecified asthma, uncomplicated: Secondary | ICD-10-CM | POA: Insufficient documentation

## 2015-10-26 DIAGNOSIS — Z131 Encounter for screening for diabetes mellitus: Secondary | ICD-10-CM | POA: Insufficient documentation

## 2015-10-26 DIAGNOSIS — Z79899 Other long term (current) drug therapy: Secondary | ICD-10-CM | POA: Insufficient documentation

## 2015-10-26 DIAGNOSIS — K219 Gastro-esophageal reflux disease without esophagitis: Secondary | ICD-10-CM | POA: Insufficient documentation

## 2015-10-26 DIAGNOSIS — I10 Essential (primary) hypertension: Secondary | ICD-10-CM | POA: Insufficient documentation

## 2015-10-26 LAB — POCT GLYCOSYLATED HEMOGLOBIN (HGB A1C): HEMOGLOBIN A1C: 5.3

## 2015-10-26 MED ORDER — GUAIFENESIN-CODEINE 100-10 MG/5ML PO SYRP: 5.0000 mL | ORAL_SOLUTION | Freq: Three times a day (TID) | ORAL | Status: DC | PRN

## 2015-10-26 NOTE — Progress Notes (Signed)
Patient ID: Angel Hebert, female   DOB: Oct 16, 1955, 60 y.o.   MRN: [id number]  CC: HFU  HPI: Angel Hebert is a 60 y.o. female here today for a follow up visit.  Patient has past medical history of asthma, HLD, HTN, GERD. Patient reports that she was recently discharged from the hospital for CAP and sepsis. She was discharged with Levaquin and has completed all medication. She states that she still has a lingering non-productive cough. She has also continued to take iron supplement due to very low hemoglobin levels while hospitalized. She denies any dark tarry stools or fatigue.    No Known Allergies Past Medical History  Diagnosis Date  . Asthma   . High cholesterol   . Hypertension   . GERD (gastroesophageal reflux disease)    Current Outpatient Prescriptions on File Prior to Visit  Medication Sig Dispense Refill  . albuterol (PROVENTIL HFA;VENTOLIN HFA) 108 (90 BASE) MCG/ACT inhaler Inhale 1-2 puffs into the lungs every 6 (six) hours as needed for wheezing. 1 Inhaler 2  . atorvastatin (LIPITOR) 10 MG tablet Take 1 tablet (10 mg total) by mouth daily at 6 PM. For cholesterol 30 tablet 5  . cetirizine (ZYRTEC) 10 MG tablet Take 1 tablet (10 mg total) by mouth daily. 30 tablet 1  . fluticasone (FLONASE) 50 MCG/ACT nasal spray Place 2 sprays into both nostrils daily. 16 g 2  . Fluticasone-Salmeterol (ADVAIR) 500-50 MCG/DOSE AEPB Inhale 1 puff into the lungs every 12 (twelve) hours. 60 each 4  . hydrochlorothiazide (HYDRODIURIL) 25 MG tablet Take 0.5 tablets (12.5 mg total) by mouth daily. For blood pressure 30 tablet 5  . irbesartan (AVAPRO) 150 MG tablet Take 1 tablet (150 mg total) by mouth daily after breakfast. For blood pressure 30 tablet 5  . iron polysaccharides (NIFEREX) 150 MG capsule Take 1 capsule (150 mg total) by mouth daily. 30 capsule 2  . montelukast (SINGULAIR) 10 MG tablet Take 1 tablet (10 mg total) by mouth at bedtime. 30 tablet 2  . pantoprazole (PROTONIX) 20 MG tablet Take 1  tablet (20 mg total) by mouth daily. 30 tablet 5  . traZODone (DESYREL) 50 MG tablet Take 1 tablet (50 mg total) by mouth at bedtime. 30 tablet 0  . benzonatate (TESSALON) 200 MG capsule Take 1 capsule (200 mg total) by mouth 3 (three) times daily as needed for cough. (Patient not taking: Reported on 10/26/2015) 20 capsule 0  . [DISCONTINUED] loratadine (CLARITIN) 10 MG tablet Take 10 mg by mouth daily.     No current facility-administered medications on file prior to visit.   History reviewed. No pertinent family history. Social History   Social History  . Marital Status: Single    Spouse Name: N/A  . Number of Children: N/A  . Years of Education: N/A   Occupational History  . Not on file.   Social History Main Topics  . Smoking status: Never Smoker   . Smokeless tobacco: Not on file  . Alcohol Use: No  . Drug Use: No  . Sexual Activity: Not on file   Other Topics Concern  . Not on file   Social History Narrative    Review of Systems: Other than what is stated in HPI, all other systems are negative.   Objective:   Filed Vitals:   10/26/15 1447  BP: 157/90  Pulse: 79  Temp: 98.6 F (37 C)  Resp: 15    Physical Exam  Constitutional: She is oriented to person, place, and  time.  Cardiovascular: Normal rate, regular rhythm and normal heart sounds.   Pulmonary/Chest: Effort normal and breath sounds normal. She has no wheezes.  Neurological: She is alert and oriented to person, place, and time.  Skin: Skin is warm and dry.  Psychiatric: She has a normal mood and affect.    Lab Results  Component Value Date   WBC 12.5* 10/15/2015   HGB 6.5* 10/15/2015   HCT 20.4* 10/15/2015   MCV 79.4 10/15/2015   PLT 830* 10/15/2015   Lab Results  Component Value Date   CREATININE 0.55 10/14/2015   BUN <5* 10/14/2015   NA 138 10/14/2015   K 4.1 10/14/2015   CL 103 10/14/2015   CO2 24 10/14/2015    Lab Results  Component Value Date   HGBA1C 5.3 10/26/2015   Lipid  Panel     Component Value Date/Time   CHOL 184 02/20/2010 2258   TRIG 79 02/20/2010 2258   HDL 71 02/20/2010 2258   CHOLHDL 2.6 Ratio 02/20/2010 2258   VLDL 16 02/20/2010 2258   LDLCALC 97 02/20/2010 2258       Assessment and plan:   Dafney was seen today for hospitalization follow-up.  Diagnoses and all orders for this visit:  CAP (community acquired pneumonia) -     Basic Metabolic Panel -     Begin guaiFENesin-codeine (ROBITUSSIN AC) 100-10 MG/5ML syrup; Take 5 mLs by mouth 3 (three) times daily as needed for cough.  Anemia, unspecified anemia type -     CBC with Differential I will recheck today. I have explained that she needs to apply for [facility] so that I can send her for a Colonoscopy. She has never had a Colonoscopy.   Diabetes mellitus screening -     HgB A1c   Return if symptoms worsen or fail to improve.       Lance Bosch, [facility] [phone] 10/26/2015, 3:03 PM

## 2015-10-26 NOTE — Progress Notes (Signed)
Patient reports feeling better after recent hospitalization She has taken her meds today She finished her antibiotics

## 2015-10-27 LAB — CBC WITH DIFFERENTIAL/PLATELET
BASOS ABS: 0.1 10*3/uL (ref 0.0–0.1)
Basophils Relative: 1 % (ref 0–1)
EOS ABS: 0.1 10*3/uL (ref 0.0–0.7)
Eosinophils Relative: 1 % (ref 0–5)
HEMATOCRIT: 29.8 % — AB (ref 36.0–46.0)
Hemoglobin: 9.2 g/dL — ABNORMAL LOW (ref 12.0–15.0)
LYMPHS ABS: 2.3 10*3/uL (ref 0.7–4.0)
LYMPHS PCT: 27 % (ref 12–46)
MCH: 26.4 pg (ref 26.0–34.0)
MCHC: 30.9 g/dL (ref 30.0–36.0)
MCV: 85.4 fL (ref 78.0–100.0)
MPV: 8.7 fL (ref 8.6–12.4)
Monocytes Absolute: 0.3 10*3/uL (ref 0.1–1.0)
Monocytes Relative: 4 % (ref 3–12)
NEUTROS PCT: 67 % (ref 43–77)
Neutro Abs: 5.8 10*3/uL (ref 1.7–7.7)
PLATELETS: 713 10*3/uL — AB (ref 150–400)
RBC: 3.49 MIL/uL — ABNORMAL LOW (ref 3.87–5.11)
RDW: 20.8 % — ABNORMAL HIGH (ref 11.5–15.5)
WBC: 8.7 10*3/uL (ref 4.0–10.5)

## 2015-10-27 LAB — BASIC METABOLIC PANEL
BUN: 6 mg/dL — ABNORMAL LOW (ref 7–25)
CHLORIDE: 101 mmol/L (ref 98–110)
CO2: 28 mmol/L (ref 20–31)
CREATININE: 0.43 mg/dL — AB (ref 0.50–1.05)
Calcium: 8.9 mg/dL (ref 8.6–10.4)
Glucose, Bld: 66 mg/dL (ref 65–99)
POTASSIUM: 4.1 mmol/L (ref 3.5–5.3)
Sodium: 136 mmol/L (ref 135–146)

## 2015-11-03 ENCOUNTER — Ambulatory Visit: Payer: Self-pay | Attending: Family Medicine

## 2015-11-06 ENCOUNTER — Telehealth: Payer: Self-pay

## 2015-11-06 NOTE — Telephone Encounter (Signed)
Tried to contact patient Patient not available Left message with family member to have her return our call

## 2015-11-06 NOTE — Telephone Encounter (Signed)
-----   Message from Lance Bosch, NP sent at 11/06/2015 12:52 PM EST ----- Anemia has improved but she still needs to continue taking iron. If she has [facility] please place order for Colonoscopy to evaluate for bleeding

## 2015-11-08 ENCOUNTER — Other Ambulatory Visit: Payer: Self-pay | Admitting: Internal Medicine

## 2015-11-09 ENCOUNTER — Encounter: Payer: Self-pay | Admitting: Gastroenterology

## 2015-11-09 ENCOUNTER — Telehealth: Payer: Self-pay

## 2015-11-09 DIAGNOSIS — Z0000 Encounter for general adult medical examination without abnormal findings: Secondary | ICD-10-CM

## 2015-11-09 NOTE — Telephone Encounter (Signed)
Patient returned phone call and stated she needs a referral to GI for  Her colonoscopy

## 2015-11-09 NOTE — Telephone Encounter (Signed)
Tried to contact patient to see why she needs a referral to gi Patient not available Message left on voice mail to return our call

## 2015-12-01 ENCOUNTER — Ambulatory Visit: Payer: Self-pay | Admitting: Internal Medicine

## 2015-12-08 ENCOUNTER — Other Ambulatory Visit: Payer: Self-pay | Admitting: Internal Medicine

## 2015-12-08 ENCOUNTER — Ambulatory Visit: Payer: Self-pay | Attending: Internal Medicine | Admitting: Internal Medicine

## 2015-12-08 ENCOUNTER — Encounter: Payer: Self-pay | Admitting: Internal Medicine

## 2015-12-08 VITALS — BP 175/75 | HR 96 | Temp 97.5°F | Resp 16 | Ht 67.0 in | Wt 143.6 lb

## 2015-12-08 DIAGNOSIS — R059 Cough, unspecified: Secondary | ICD-10-CM

## 2015-12-08 DIAGNOSIS — E7800 Pure hypercholesterolemia, unspecified: Secondary | ICD-10-CM | POA: Insufficient documentation

## 2015-12-08 DIAGNOSIS — J45909 Unspecified asthma, uncomplicated: Secondary | ICD-10-CM | POA: Insufficient documentation

## 2015-12-08 DIAGNOSIS — M25469 Effusion, unspecified knee: Secondary | ICD-10-CM | POA: Insufficient documentation

## 2015-12-08 DIAGNOSIS — M25561 Pain in right knee: Secondary | ICD-10-CM | POA: Insufficient documentation

## 2015-12-08 DIAGNOSIS — Z79899 Other long term (current) drug therapy: Secondary | ICD-10-CM | POA: Insufficient documentation

## 2015-12-08 DIAGNOSIS — J339 Nasal polyp, unspecified: Secondary | ICD-10-CM | POA: Insufficient documentation

## 2015-12-08 DIAGNOSIS — M25562 Pain in left knee: Secondary | ICD-10-CM | POA: Insufficient documentation

## 2015-12-08 DIAGNOSIS — K219 Gastro-esophageal reflux disease without esophagitis: Secondary | ICD-10-CM | POA: Insufficient documentation

## 2015-12-08 DIAGNOSIS — I10 Essential (primary) hypertension: Secondary | ICD-10-CM | POA: Insufficient documentation

## 2015-12-08 DIAGNOSIS — R05 Cough: Secondary | ICD-10-CM | POA: Insufficient documentation

## 2015-12-08 MED ORDER — BENZONATATE 200 MG PO CAPS: 200.0000 mg | ORAL_CAPSULE | Freq: Three times a day (TID) | ORAL | Status: DC | PRN

## 2015-12-08 MED ORDER — PREDNISONE 20 MG PO TABS: 20.0000 mg | ORAL_TABLET | Freq: Every day | ORAL | Status: DC

## 2015-12-08 NOTE — Progress Notes (Signed)
Patient ID: Angel Hebert, female   DOB: 10-Aug-1956, 60 y.o.   MRN: [id number]  CC: joint swelling  HPI: Angel Hebert is a 60 y.o. female here today for a follow up visit.  Patient has past medical history of GERD, HTN, and nasal polyps. Patient reports that she has been suffering from bilateral knee pain and joint swelling for the past 2 months. Knees are painful and often rated as a 10/10 on pain scale. Pain is aggravated by activity and walking. She states that she had her knee drained several years ago. She has only tried Tylenol Arthritis for pain with very minimal relief.  Patient reports that she has been out of her Avapro for one week. States that she only took HCTZ this morning. Patient states that it is very difficult for her to get transportation to the pharmacy.  No Known Allergies Past Medical History  Diagnosis Date  . Asthma   . High cholesterol   . Hypertension   . GERD (gastroesophageal reflux disease)    Current Outpatient Prescriptions on File Prior to Visit  Medication Sig Dispense Refill  . albuterol (PROVENTIL HFA;VENTOLIN HFA) 108 (90 BASE) MCG/ACT inhaler Inhale 1-2 puffs into the lungs every 6 (six) hours as needed for wheezing. 1 Inhaler 2  . atorvastatin (LIPITOR) 10 MG tablet Take 1 tablet (10 mg total) by mouth daily at 6 PM. For cholesterol 30 tablet 5  . cetirizine (ZYRTEC) 10 MG tablet Take 1 tablet (10 mg total) by mouth daily. 30 tablet 1  . fluticasone (FLONASE) 50 MCG/ACT nasal spray Place 2 sprays into both nostrils daily. 16 g 2  . Fluticasone-Salmeterol (ADVAIR) 500-50 MCG/DOSE AEPB Inhale 1 puff into the lungs every 12 (twelve) hours. 60 each 4  . hydrochlorothiazide (HYDRODIURIL) 25 MG tablet Take 0.5 tablets (12.5 mg total) by mouth daily. For blood pressure 30 tablet 5  . irbesartan (AVAPRO) 150 MG tablet Take 1 tablet (150 mg total) by mouth daily after breakfast. For blood pressure 30 tablet 5  . montelukast (SINGULAIR) 10 MG tablet Take 1 tablet (10 mg  total) by mouth at bedtime. 30 tablet 2  . pantoprazole (PROTONIX) 20 MG tablet Take 1 tablet (20 mg total) by mouth daily. 30 tablet 5  . traZODone (DESYREL) 50 MG tablet Take 1 tablet (50 mg total) by mouth at bedtime. 30 tablet 0  . benzonatate (TESSALON) 200 MG capsule Take 1 capsule (200 mg total) by mouth 3 (three) times daily as needed for cough. (Patient not taking: Reported on 10/26/2015) 20 capsule 0  . guaiFENesin-codeine (ROBITUSSIN AC) 100-10 MG/5ML syrup Take 5 mLs by mouth 3 (three) times daily as needed for cough. (Patient not taking: Reported on 12/08/2015) 180 mL 0  . iron polysaccharides (NIFEREX) 150 MG capsule Take 1 capsule (150 mg total) by mouth daily. (Patient not taking: Reported on 12/08/2015) 30 capsule 2  . [DISCONTINUED] loratadine (CLARITIN) 10 MG tablet Take 10 mg by mouth daily.     No current facility-administered medications on file prior to visit.   History reviewed. No pertinent family history. Social History   Social History  . Marital Status: Single    Spouse Name: N/A  . Number of Children: N/A  . Years of Education: N/A   Occupational History  . Not on file.   Social History Main Topics  . Smoking status: Never Smoker   . Smokeless tobacco: Not on file  . Alcohol Use: No  . Drug Use: No  . Sexual Activity:  Not on file   Other Topics Concern  . Not on file   Social History Narrative    Review of Systems: Other than what is stated in HPI, all other systems are negative.   Objective:   Filed Vitals:   12/08/15 0927  BP: 175/75  Pulse: 96  Temp: 97.5 F (36.4 C)  Resp: 16    Physical Exam  Constitutional: She is oriented to person, place, and time.  HENT:  Right Ear: External ear normal.  Left Ear: External ear normal.  Nose: Nose normal.  Mouth/Throat: Oropharynx is clear and moist.  Bilateral nasal polyps  Cardiovascular: Normal rate, regular rhythm and normal heart sounds.   Pulmonary/Chest: Effort normal and breath sounds  normal.  Musculoskeletal: Normal range of motion. She exhibits edema and tenderness (medial knees bilaterally, effusion).  Neurological: She is alert and oriented to person, place, and time.     Lab Results  Component Value Date   WBC 8.7 10/26/2015   HGB 9.2* 10/26/2015   HCT 29.8* 10/26/2015   MCV 85.4 10/26/2015   PLT 713* 10/26/2015   Lab Results  Component Value Date   CREATININE 0.43* 10/26/2015   BUN 6* 10/26/2015   NA 136 10/26/2015   K 4.1 10/26/2015   CL 101 10/26/2015   CO2 28 10/26/2015    Lab Results  Component Value Date   HGBA1C 5.3 10/26/2015   Lipid Panel     Component Value Date/Time   CHOL 184 02/20/2010 2258   TRIG 79 02/20/2010 2258   HDL 71 02/20/2010 2258   CHOLHDL 2.6 Ratio 02/20/2010 2258   VLDL 16 02/20/2010 2258   LDLCALC 97 02/20/2010 2258       Assessment and plan:   Flara was seen today for joint swelling.  Diagnoses and all orders for this visit:  Knee effusion, unspecified laterality -     Ambulatory referral to Orthopedic Surgery -     predniSONE (DELTASONE) 20 MG tablet; Take 1 tablet (20 mg total) by mouth daily with breakfast. Patient unable to use NSAID's due to blood pressure. Steroids will help with nasal polyps and knee until seen by Orthopedics. Will use heat and rest.   Nasal polyps -     predniSONE (DELTASONE) 20 MG tablet; Take 1 tablet (20 mg total) by mouth daily with breakfast. Continue flonase.   Cough -     benzonatate (TESSALON) 200 MG capsule; Take 1 capsule (200 mg total) by mouth 3 (three) times daily as needed for cough. Was diagnosed with Pneumonia 2 months ago, has resolved. Cough could be a result of PND. I have encourage continued use of Flonase and inhaler.  HTN Patient blood pressure remains elevated today, she will stay on current regimen. I have stressed taking medication as directed. She will return in 2 weeks for blood pressure recheck with nurse. Stressed diet changes, regular exercise  regimen, and modifiable risk factors. Will follow up with CMP as needed, Will follow up with patient in 3 months.   Return in about 2 weeks (around 12/22/2015) for Nurse Visit-BP check and 3 mo PCP .       Lance Bosch, [facility] [phone] 12/08/2015, 9:40 AM

## 2015-12-08 NOTE — Progress Notes (Signed)
Patient c/o swelling to both knees. Patient states they have been swollen x29months.   Patient rates her pain 10/10, described as painful to walk on.

## 2015-12-12 ENCOUNTER — Other Ambulatory Visit: Payer: Self-pay | Admitting: Internal Medicine

## 2015-12-12 NOTE — Telephone Encounter (Signed)
Pt. Called requesting a med refill on traZODone (DESYREL) 50 MG tablet. Please f/u with pt. °

## 2015-12-13 ENCOUNTER — Encounter: Payer: Self-pay | Admitting: Family Medicine

## 2015-12-13 ENCOUNTER — Ambulatory Visit (INDEPENDENT_AMBULATORY_CARE_PROVIDER_SITE_OTHER): Payer: Self-pay | Admitting: Family Medicine

## 2015-12-13 ENCOUNTER — Ambulatory Visit
Admission: RE | Admit: 2015-12-13 | Discharge: 2015-12-13 | Disposition: A | Discharge status: Home / Self Care | Payer: No Typology Code available for payment source | Source: Ambulatory Visit | Attending: Family Medicine | Admitting: Family Medicine

## 2015-12-13 DIAGNOSIS — M1712 Unilateral primary osteoarthritis, left knee: Secondary | ICD-10-CM | POA: Insufficient documentation

## 2015-12-13 DIAGNOSIS — M25562 Pain in left knee: Secondary | ICD-10-CM

## 2015-12-13 DIAGNOSIS — M25561 Pain in right knee: Secondary | ICD-10-CM

## 2015-12-13 MED ORDER — METHYLPREDNISOLONE ACETATE 40 MG/ML IJ SUSP
40.0000 mg | Freq: Once | INTRAMUSCULAR | Status: AC
  Administered 2015-12-13: 40 mg via INTRA_ARTICULAR

## 2015-12-13 NOTE — Progress Notes (Signed)
  [name] - 60 y.o. female MRN [id number]  Date of birth: 03-14-1956  SUBJECTIVE:  Including CC & ROS.  Angel Hebert is a 60 y.o. female who presents today for B/L knee pain.    Knee Pain B/L, initial visit 12/13/15 - patient presents today for ongoing bilateral knee pain. Pain is worse in the left than the right. It aches at night and bugs her when initially moving. Does get better with activity. 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 She is previously had one aspiration injection was worked for 2-3 years. Has tried medication but no brace or compression sleeve.  PMHx - Updated and reviewed.  Contributory factors include: HTn, HLD, GERD PSHx - Updated and reviewed.  Contributory factors include:  Hysterectomy FHx - Updated and reviewed.  Contributory factors include:  Negative Social Hx - Updated and reviewed. Contributory factors include: Non smoker   Medications - Updated/reviewed    12 point ROS negative other than per HPI.   Exam:  Filed Vitals:   12/13/15 1354  BP: 124/66   Gen: NAD, AAO 3 Cardio- RRR Pulm - Normal respiratory effort/rate Skin: No rashes or erythema Extremities: No edema  Vascular: pulses +2 bilateral upper and lower extremity Psych: Normal affect   B/L Knee:  Effusion +1 L>R TTP Medial jt line L>R ROM normal in flexion (135 degrees) and extension (0 degrees) and lower leg rotation. Ligaments with solid consistent endpoints including ACL, PCL, LCL, MCL.  Negative Anterior Drawer/Lachman/Pivot Shift Patellar and quadriceps tendons unremarkable. Hamstring and quadriceps strength is normal.  Neurovascularly intact B/L LE

## 2015-12-13 NOTE — Assessment & Plan Note (Signed)
L>R.  B/L 4 view standing films today.  Consider medication, medial unloading brace, topical medications, physical therapy, compression sleeve, synvisc and PRN injections.   Aspiration/Injection Procedure Note Lameeka Dungee 04/23/1956  Procedure: Aspiration and Injection Indications: Knee effusion and pain B/L   Procedure Details Consent: Risks of procedure as well as the alternatives and risks of each were explained to the (patient/caregiver).  Consent for procedure obtained. Time Out: Verified patient identification, verified procedure, site/side was marked, verified correct patient position, special equipment/implants available, medications/allergies/relevent history reviewed, required imaging and test results available.  Performed.  The area was cleaned with iodine and alcohol swabs.    The R and L knee were injected using 2 cc's of 40mg  Depomedrol and 2 cc's of 1% lidocaine with a 21 1 1/2" needle.    Amount of Fluid Aspirated: 40mL B/L  Character of Fluid: straw colored Fluid was sent [id number] A sterile dressing was applied.  Patient did tolerate procedure well. Estimated blood loss: None

## 2015-12-20 ENCOUNTER — Ambulatory Visit: Payer: Self-pay | Admitting: Sports Medicine

## 2015-12-26 ENCOUNTER — Ambulatory Visit: Payer: Self-pay | Admitting: Pharmacist

## 2016-01-03 ENCOUNTER — Ambulatory Visit (AMBULATORY_SURGERY_CENTER): Payer: Self-pay | Admitting: *Deleted

## 2016-01-03 VITALS — Ht 67.0 in | Wt 151.0 lb

## 2016-01-03 DIAGNOSIS — Z1211 Encounter for screening for malignant neoplasm of colon: Secondary | ICD-10-CM

## 2016-01-03 MED ORDER — NA SULFATE-K SULFATE-MG SULF 17.5-3.13-1.6 GM/177ML PO SOLN: ORAL | Status: DC

## 2016-01-03 NOTE — Progress Notes (Signed)
Patient denies any allergies to eggs or soy. Patient denies any problems with anesthesia/sedation. Patient denies any oxygen use at home and does not take any diet/weight loss medications. Patient declined EMMI education. Patient has orange card, Suprep sample given to patient during PV. Pt's sister at her side during PV today.

## 2016-01-17 ENCOUNTER — Encounter: Payer: Self-pay | Admitting: Gastroenterology

## 2016-01-17 ENCOUNTER — Ambulatory Visit (AMBULATORY_SURGERY_CENTER): Payer: Self-pay | Admitting: Gastroenterology

## 2016-01-17 VITALS — BP 128/93 | HR 83 | Temp 97.8°F | Resp 16 | Ht 67.0 in | Wt 151.0 lb

## 2016-01-17 DIAGNOSIS — D125 Benign neoplasm of sigmoid colon: Secondary | ICD-10-CM

## 2016-01-17 DIAGNOSIS — D120 Benign neoplasm of cecum: Secondary | ICD-10-CM

## 2016-01-17 DIAGNOSIS — D123 Benign neoplasm of transverse colon: Secondary | ICD-10-CM

## 2016-01-17 DIAGNOSIS — Z1211 Encounter for screening for malignant neoplasm of colon: Secondary | ICD-10-CM

## 2016-01-17 MED ORDER — SODIUM CHLORIDE 0.9 % IV SOLN: 500.0000 mL | INTRAVENOUS | Status: DC

## 2016-01-17 NOTE — Progress Notes (Signed)
Called to room to assist during endoscopic procedure.  Patient ID and intended procedure confirmed with present staff. Received instructions for my participation in the procedure from the performing physician.  

## 2016-01-17 NOTE — Op Note (Signed)
[facility] Patient Name: Angel Hebert Procedure Date: 01/17/2016 10:44 AM MRN: [id number] Endoscopist: Mauri Pole , MD Age: 60 Referring MD:  Date of Birth: 1956/01/16 Gender: Female Procedure:                Colonoscopy Indications:              Screening for colorectal malignant neoplasm Medicines:                Monitored Anesthesia Care Procedure:                Pre-Anesthesia Assessment:                           - Prior to the procedure, a History and Physical                            was performed, and patient medications and                            allergies were reviewed. The patient's tolerance of                            previous anesthesia was also reviewed. The risks                            and benefits of the procedure and the sedation                            options and risks were discussed with the patient.                            All questions were answered, and informed consent                            was obtained. Prior Anticoagulants: The patient has                            taken no previous anticoagulant or antiplatelet                            agents. ASA Grade Assessment: II - A patient with                            mild systemic disease. After reviewing the risks                            and benefits, the patient was deemed in                            satisfactory condition to undergo the procedure.                           After obtaining informed consent, the colonoscope  was passed under direct vision. Throughout the                            procedure, the patient's blood pressure, pulse, and                            oxygen saturations were monitored continuously. The                            Model CF-HQ190L [id number]) scope was introduced                            through the anus and advanced to the the cecum,                            identified by appendiceal orifice and  ileocecal                            valve. The colonoscopy was performed without                            difficulty. The patient tolerated the procedure                            well. The quality of the bowel preparation was                            good. The ileocecal valve, appendiceal orifice, and                            rectum were photographed. Scope In: 10:56:01 AM Scope Out: 11:13:36 AM Scope Withdrawal Time: 0 hours 14 minutes 19 seconds  Total Procedure Duration: 0 hours 17 minutes 35 seconds  Findings:                 The perianal and digital rectal examinations were                            normal.                           A 11 mm polyp was found in the cecum. The polyp was                            sessile. The polyp was removed with a hot snare.                            Resection and retrieval were complete.                           A 4 mm polyp was found in the transverse colon. The                            polyp was sessile. The polyp was removed with  a                            cold biopsy forceps. Resection and retrieval were                            complete.                           A 7 mm polyp was found in the sigmoid colon. The                            polyp was sessile. The polyp was removed with a                            cold snare. Resection and retrieval were complete.                           A few small-mouthed diverticula were found in the                            sigmoid colon.                           Non-bleeding internal hemorrhoids were found during                            retroflexion. The hemorrhoids were small. Complications:            No immediate complications. Estimated Blood Loss:     Estimated blood loss: none. Impression:               - One 11 mm polyp in the cecum, removed with a hot                            snare. Resected and retrieved.                           - One 4 mm polyp in the transverse  colon, removed                            with a cold biopsy forceps. Resected and retrieved.                           - One 7 mm polyp in the sigmoid colon, removed with                            a cold snare. Resected and retrieved.                           - Diverticulosis in the sigmoid colon.                           - Non-bleeding internal hemorrhoids. Recommendation:           - Patient has a  contact number available for                            emergencies. The signs and symptoms of potential                            delayed complications were discussed with the                            patient. Return to normal activities tomorrow.                            Written discharge instructions were provided to the                            patient.                           - Resume previous diet.                           - Continue present medications.                           - Await pathology results.                           - Repeat colonoscopy in 3 - 5 years for                            surveillance.                           - Return to GI clinic PRN. Mauri Pole, MD 01/17/2016 11:24:35 AM This report has been signed electronically.

## 2016-01-17 NOTE — Patient Instructions (Signed)
YOU HAD AN ENDOSCOPIC PROCEDURE TODAY AT THE [facility]:   Refer to the procedure report that was given to you for any specific questions about what was found during the examination.  If the procedure report does not answer your questions, please call your gastroenterologist to clarify.  If you requested that your care partner not be given the details of your procedure findings, then the procedure report has been included in a sealed envelope for you to review at your convenience later.  YOU SHOULD EXPECT: Some feelings of bloating in the abdomen. Passage of more gas than usual.  Walking can help get rid of the air that was put into your GI tract during the procedure and reduce the bloating. If you had a lower endoscopy (such as a colonoscopy or flexible sigmoidoscopy) you may notice spotting of blood in your stool or on the toilet paper. If you underwent a bowel prep for your procedure, you may not have a normal bowel movement for a few days.  Please Note:  You might notice some irritation and congestion in your nose or some drainage.  This is from the oxygen used during your procedure.  There is no need for concern and it should clear up in a day or so.  SYMPTOMS TO REPORT IMMEDIATELY:   Following lower endoscopy (colonoscopy or flexible sigmoidoscopy):  Excessive amounts of blood in the stool  Significant tenderness or worsening of abdominal pains  Swelling of the abdomen that is new, acute  Fever of 100F or higher   For urgent or emergent issues, a gastroenterologist can be reached at any hour by calling [phone].   DIET: Your first meal following the procedure should be a small meal and then it is ok to progress to your normal diet. Heavy or fried foods are harder to digest and may make you feel nauseous or bloated.  Likewise, meals heavy in dairy and vegetables can increase bloating.  Drink plenty of fluids but you should avoid alcoholic beverages for 24  hours.  ACTIVITY:  You should plan to take it easy for the rest of today and you should NOT DRIVE or use heavy machinery until tomorrow (because of the sedation medicines used during the test).    FOLLOW UP: Our staff will call the number listed on your records the next business day following your procedure to check on you and address any questions or concerns that you may have regarding the information given to you following your procedure. If we do not reach you, we will leave a message.  However, if you are feeling well and you are not experiencing any problems, there is no need to return our call.  We will assume that you have returned to your regular daily activities without incident.  If any biopsies were taken you will be contacted by phone or by letter within the next 1-3 weeks.  Please call us at [phone] if you have not heard about the biopsies in 3 weeks.    SIGNATURES/CONFIDENTIALITY: You and/or your care partner have signed paperwork which will be entered into your electronic medical record.  These signatures attest to the fact that that the information above on your After Visit Summary has been reviewed and is understood.  Full responsibility of the confidentiality of this discharge information lies with you and/or your care-partner. 

## 2016-01-18 ENCOUNTER — Telehealth: Payer: Self-pay | Admitting: *Deleted

## 2016-01-18 NOTE — Telephone Encounter (Signed)
  Follow up Call-  Call back number 01/17/2016  Permission to leave phone message Yes     Patient questions:  Do you have a fever, pain , or abdominal swelling? No. Pain Score  0 *  Have you tolerated food without any problems? Yes.    Have you been able to return to your normal activities? Yes.    Do you have any questions about your discharge instructions: Diet   No. Medications  No. Follow up visit  No.  Do you have questions or concerns about your Care? No.  Actions: * If pain score is 4 or above: No action needed, pain <4.

## 2016-01-24 ENCOUNTER — Encounter: Payer: Self-pay | Admitting: Gastroenterology

## 2016-02-05 ENCOUNTER — Other Ambulatory Visit: Payer: Self-pay | Admitting: Internal Medicine

## 2016-02-07 ENCOUNTER — Other Ambulatory Visit: Payer: Self-pay | Admitting: Internal Medicine

## 2016-02-08 NOTE — Telephone Encounter (Signed)
Medication Refill: montelukast (SINGULAIR) 10 MG tablet

## 2016-02-20 ENCOUNTER — Other Ambulatory Visit: Payer: Self-pay | Admitting: Internal Medicine

## 2016-02-20 ENCOUNTER — Ambulatory Visit (HOSPITAL_COMMUNITY)
Admission: EM | Admit: 2016-02-20 | Discharge: 2016-02-20 | Disposition: A | Discharge status: Home / Self Care | Payer: No Typology Code available for payment source | Attending: Emergency Medicine | Admitting: Emergency Medicine

## 2016-02-20 ENCOUNTER — Encounter (HOSPITAL_COMMUNITY): Payer: Self-pay | Admitting: Emergency Medicine

## 2016-02-20 DIAGNOSIS — J018 Other acute sinusitis: Secondary | ICD-10-CM

## 2016-02-20 DIAGNOSIS — J339 Nasal polyp, unspecified: Secondary | ICD-10-CM

## 2016-02-20 MED ORDER — FLUTICASONE PROPIONATE 50 MCG/ACT NA SUSP: 2.0000 | Freq: Every day | NASAL | Status: DC

## 2016-02-20 MED ORDER — CETIRIZINE HCL 10 MG PO TABS: ORAL_TABLET | ORAL | Status: DC

## 2016-02-20 MED ORDER — AMOXICILLIN-POT CLAVULANATE 875-125 MG PO TABS: 1.0000 | ORAL_TABLET | Freq: Two times a day (BID) | ORAL | Status: DC

## 2016-02-20 MED ORDER — PREDNISONE 20 MG PO TABS: ORAL_TABLET | ORAL | Status: DC

## 2016-02-20 NOTE — ED Notes (Signed)
PT reports nasal congestion, facial pain and pressure, and non productive cough for 3 weeks.

## 2016-02-20 NOTE — Discharge Instructions (Signed)
You have a combination of allergies and infection causing your symptoms. Take Augmentin and prednisone as prescribed. These will help with the infection. Use the cetirizine and Flonase daily to help with the allergies. This should get better over the next several days. You can only use Afrin for 3 days. Follow-up as needed.

## 2016-02-20 NOTE — ED Provider Notes (Signed)
CSN: [id number]     Arrival date & time 02/20/16  1258 History   First MD Initiated Contact with Patient 02/20/16 1310     Chief Complaint  Patient presents with  . Facial Pain   (Consider location/radiation/quality/duration/timing/severity/associated sxs/prior Treatment) HPI She is a 60 year old woman here for evaluation of nasal congestion. She states for the last 3 weeks she has had nasal congestion and sinus drainage. She reports postnasal drainage. She also reports a cough that is predominantly present at night and keeps her awake. She states she is unable to breathe through her nose at night. This makes her feel short of breath. She denies any fevers or ear pain. No chest pain. No nausea or vomiting. She has tried Benadryl as well as Afrin with minimal improvement.  She states she has a history of nasal polyps that were removed. She reports she gets symptoms like this every once a while and is usually treated with antibiotics and steroids.  Past Medical History  Diagnosis Date  . Asthma   . High cholesterol   . Hypertension   . GERD (gastroesophageal reflux disease)    Past Surgical History  Procedure Laterality Date  . Abdominal hysterectomy     Family History  Problem Relation Age of Onset  . Colon cancer Neg Hx    Social History  Substance Use Topics  . Smoking status: Never Smoker   . Smokeless tobacco: Never Used  . Alcohol Use: No   OB History    No data available     Review of Systems As in history of present illness Allergies  Review of patient's allergies indicates no known allergies.  Home Medications   Prior to Admission medications   Medication Sig Start Date End Date Taking? Authorizing Provider  albuterol (PROVENTIL HFA;VENTOLIN HFA) 108 (90 BASE) MCG/ACT inhaler Inhale 1-2 puffs into the lungs every 6 (six) hours as needed for wheezing. 07/24/15   Lance Bosch, NP  amoxicillin-clavulanate (AUGMENTIN) 875-125 MG tablet Take 1 tablet by mouth 2 (two)  times daily. 02/20/16   Melony Overly, MD  atorvastatin (LIPITOR) 10 MG tablet Take 1 tablet (10 mg total) by mouth daily at 6 PM. For cholesterol 07/24/15   Lance Bosch, NP  cetirizine (ZYRTEC) 10 MG tablet TAKE 1 TABLET (10 MG TOTAL) BY MOUTH DAILY. 02/20/16   Melony Overly, MD  fluticasone (FLONASE) 50 MCG/ACT nasal spray Place 2 sprays into both nostrils daily. 02/20/16   Melony Overly, MD  Fluticasone-Salmeterol (ADVAIR) 500-50 MCG/DOSE AEPB Inhale 1 puff into the lungs every 12 (twelve) hours. 07/24/15   Lance Bosch, NP  hydrochlorothiazide (HYDRODIURIL) 25 MG tablet Take 0.5 tablets (12.5 mg total) by mouth daily. For blood pressure 07/24/15   Lance Bosch, NP  irbesartan (AVAPRO) 150 MG tablet Take 1 tablet (150 mg total) by mouth daily after breakfast. For blood pressure 07/24/15   Lance Bosch, NP  montelukast (SINGULAIR) 10 MG tablet TAKE 1 TABLET BY MOUTH AT BEDTIME. 02/08/16   Tresa Garter, MD  pantoprazole (PROTONIX) 20 MG tablet Take 1 tablet (20 mg total) by mouth daily. 07/24/15   Lance Bosch, NP  predniSONE (DELTASONE) 20 MG tablet Take 3 tablets daily for 5 days, then 2 tablets for 3 days, then 1 tablet for 3 days. 02/20/16   Melony Overly, MD  traZODone (DESYREL) 50 MG tablet Take 1 tablet (50 mg total) by mouth at bedtime. Must have office visit for refills 02/05/16  Tresa Garter, MD   Meds Ordered and Administered this Visit  Medications - No data to display  BP 117/74 mmHg  Pulse 91  Temp(Src) 98.3 F (36.8 C) (Oral)  Resp 20  Ht 5\' 7"  (1.702 m)  SpO2 99% No data found.   Physical Exam  Constitutional: She is oriented to person, place, and time. She appears well-developed and well-nourished.  HENT:  Mouth/Throat: No oropharyngeal exudate.  Thick postnasal drainage seen. Nasal mucosa is quite edematous with some purulent appearing drainage. Right TM is normal. Left TM obscured by earwax.  Neck: Neck supple.  Cardiovascular: Normal rate, regular  rhythm and normal heart sounds.   No murmur heard. Pulmonary/Chest: Effort normal and breath sounds normal. No respiratory distress. She has no wheezes. She has no rales.  Lymphadenopathy:    She has no cervical adenopathy.  Neurological: She is alert and oriented to person, place, and time.    ED Course  Procedures (including critical care time)  Labs Review Labs Reviewed - No data to display  Imaging Review No results found.    MDM   1. Other acute sinusitis   2. Left nasal polyps    Likely combination of allergies and subsequent infection. Treat with Augmentin and prednisone. Cetirizine and Flonase for allergic component. Discussed that she can only use Afrin for 3 days. Follow-up as needed.    Melony Overly, MD 02/20/16 [phone]

## 2016-02-29 ENCOUNTER — Other Ambulatory Visit: Payer: Self-pay | Admitting: Internal Medicine

## 2016-04-01 ENCOUNTER — Other Ambulatory Visit: Payer: Self-pay | Admitting: Internal Medicine

## 2016-04-18 ENCOUNTER — Other Ambulatory Visit: Payer: Self-pay | Admitting: Internal Medicine

## 2016-04-18 DIAGNOSIS — E785 Hyperlipidemia, unspecified: Secondary | ICD-10-CM

## 2016-04-22 ENCOUNTER — Other Ambulatory Visit: Payer: Self-pay | Admitting: Internal Medicine

## 2016-04-22 ENCOUNTER — Telehealth: Payer: Self-pay | Admitting: Internal Medicine

## 2016-04-22 ENCOUNTER — Ambulatory Visit: Payer: No Typology Code available for payment source | Attending: Internal Medicine

## 2016-04-22 DIAGNOSIS — I10 Essential (primary) hypertension: Secondary | ICD-10-CM

## 2016-04-22 DIAGNOSIS — J4530 Mild persistent asthma, uncomplicated: Secondary | ICD-10-CM

## 2016-04-22 NOTE — Telephone Encounter (Signed)
Patient is needing Hydrochlorothiazide.   Please follow up.

## 2016-04-22 NOTE — Telephone Encounter (Signed)
Hydrochlorothiazide refilled x 30 days - patient needs office visit for further refills.

## 2016-04-26 ENCOUNTER — Encounter (HOSPITAL_COMMUNITY): Payer: Self-pay | Admitting: *Deleted

## 2016-04-26 ENCOUNTER — Ambulatory Visit (HOSPITAL_COMMUNITY)
Admission: EM | Admit: 2016-04-26 | Discharge: 2016-04-26 | Disposition: A | Discharge status: Home / Self Care | Payer: No Typology Code available for payment source | Attending: Family Medicine | Admitting: Family Medicine

## 2016-04-26 DIAGNOSIS — J339 Nasal polyp, unspecified: Secondary | ICD-10-CM

## 2016-04-26 MED ORDER — IPRATROPIUM BROMIDE 0.06 % NA SOLN: 2.0000 | Freq: Four times a day (QID) | NASAL | 1 refills | Status: DC

## 2016-04-26 MED ORDER — PREDNISONE 50 MG PO TABS: ORAL_TABLET | ORAL | 0 refills | Status: DC

## 2016-04-26 MED ORDER — FLUTICASONE PROPIONATE 50 MCG/ACT NA SUSP: 1.0000 | Freq: Two times a day (BID) | NASAL | 2 refills | Status: DC

## 2016-04-26 NOTE — ED Triage Notes (Signed)
Pt  Reports  Symptoms  Of  Nasal   Congestion   /  Cough        Sneezing    With  Symptoms     Since last  Week      Pt  Ambulated  To  Room  With a  Steady fluid gait

## 2016-04-26 NOTE — ED Provider Notes (Signed)
[place]    CSN: [id number] Arrival date & time: 04/26/16  1150  First Provider Contact:  First MD Initiated Contact with Patient 04/26/16 1238        History   Chief Complaint Chief Complaint  Patient presents with  . URI    HPI Angel Hebert is a 61 y.o. female.   The history is provided by the patient.  URI  Presenting symptoms: congestion, cough and rhinorrhea   Presenting symptoms: no fever   Severity:  Moderate Onset quality:  Gradual Duration:  1 week Progression:  Worsening Chronicity:  Recurrent (nasal polyps,cause of sx.) Relieved by:  None tried Worsened by:  Nothing Ineffective treatments:  None tried   Past Medical History:  Diagnosis Date  . Asthma   . GERD (gastroesophageal reflux disease)   . High cholesterol   . Hypertension     Patient Active Problem List   Diagnosis Date Noted  . Primary localized osteoarthritis of knee 12/13/2015  . Benign essential HTN   . Physical deconditioning   . Anemia, iron deficiency   . Pressure ulcer 10/14/2015  . CAP (community acquired pneumonia)   . Pleural effusion   . Sepsis ([place])   . Sepsis due to Streptococcus pneumoniae ([place])   . Bacteremia   . Community acquired pneumonia 10/08/2015  . Acute sinusitis 12/02/2014  . Sinusitis 03/17/2013  . HYPERLIPIDEMIA 03/02/2007  . HYPERTENSION 03/02/2007  . ALLERGIC RHINITIS 03/02/2007  . ASTHMA 03/02/2007  . GERD 03/02/2007    Past Surgical History:  Procedure Laterality Date  . ABDOMINAL HYSTERECTOMY      OB History    No data available       Home Medications    Prior to Admission medications   Medication Sig Start Date End Date Taking? Authorizing Provider  amoxicillin-clavulanate (AUGMENTIN) 875-125 MG tablet Take 1 tablet by mouth 2 (two) times daily. 02/20/16   Melony Overly, MD  atorvastatin (LIPITOR) 10 MG tablet TAKE 1 TABLET BY MOUTH DAILY AT 6 PM FOR CHOLESTEROL 04/18/16   Tresa Garter, MD  cetirizine (ZYRTEC) 10 MG  tablet TAKE 1 TABLET BY MOUTH DAILY. 03/01/16   Tresa Garter, MD  fluticasone (FLONASE) 50 MCG/ACT nasal spray Place 2 sprays into both nostrils daily. 02/20/16   Melony Overly, MD  Fluticasone-Salmeterol (ADVAIR) 500-50 MCG/DOSE AEPB Inhale 1 puff into the lungs every 12 (twelve) hours. 07/24/15   Lance Bosch, NP  hydrochlorothiazide (HYDRODIURIL) 25 MG tablet TAKE 1/2 TABLET BY MOUTH DAILY FOR BLOOD PRESSURE 04/22/16   Tresa Garter, MD  irbesartan (AVAPRO) 150 MG tablet Take 1 tablet (150 mg total) by mouth daily after breakfast. For blood pressure 07/24/15   Lance Bosch, NP  montelukast (SINGULAIR) 10 MG tablet TAKE 1 TABLET BY MOUTH AT BEDTIME. 04/22/16   Tresa Garter, MD  pantoprazole (PROTONIX) 20 MG tablet Take 1 tablet (20 mg total) by mouth daily. 07/24/15   Lance Bosch, NP  predniSONE (DELTASONE) 20 MG tablet Take 3 tablets daily for 5 days, then 2 tablets for 3 days, then 1 tablet for 3 days. 02/20/16   Melony Overly, MD  traZODone (DESYREL) 50 MG tablet TAKE 1 TABLET BY MOUTH AT BEDTIME. 04/22/16   Olugbemiga E Doreene Burke, MD  VENTOLIN HFA 108 (90 Base) MCG/ACT inhaler INHALE 1-2 PUFFS INTO THE LUNGS EVERY 6 HOURS AS NEEDED FOR WHEEZING. 04/22/16   Tresa Garter, MD    Family History Family History  Problem  Relation Age of Onset  . Colon cancer Neg Hx     Social History Social History  Substance Use Topics  . Smoking status: Never Smoker  . Smokeless tobacco: Never Used  . Alcohol use No     Allergies   Review of patient's allergies indicates no known allergies.   Review of Systems Review of Systems  Constitutional: Negative.  Negative for fever.  HENT: Positive for congestion and rhinorrhea.   Respiratory: Positive for cough.   All other systems reviewed and are negative.    Physical Exam Triage Vital Signs ED Triage Vitals [04/26/16 1224]  Enc Vitals Group     BP 134/72     Pulse Rate 85     Resp 16     Temp 97.9 F (36.6 C)      Temp Source Oral     SpO2 99 %     Weight      Height      Head Circumference      Peak Flow      Pain Score      Pain Loc      Pain Edu?      Excl. in [place]?    No data found.   Updated Vital Signs BP 134/72 (BP Location: Left Arm)   Pulse 85   Temp 97.9 F (36.6 C) (Oral)   Resp 16   SpO2 99%   Visual Acuity Right Eye Distance:   Left Eye Distance:   Bilateral Distance:    Right Eye Near:   Left Eye Near:    Bilateral Near:     Physical Exam  Constitutional: She appears well-developed and well-nourished. No distress.  HENT:  Right Ear: External ear normal.  Left Ear: External ear normal.  Nose: Mucosal edema, rhinorrhea and septal deviation present.    Mouth/Throat: Oropharynx is clear and moist.  Eyes: EOM are normal. Pupils are equal, round, and reactive to light.  Neck: Normal range of motion. Neck supple.  Cardiovascular: Normal rate, regular rhythm, normal heart sounds and intact distal pulses.   Lymphadenopathy:    She has no cervical adenopathy.  Nursing note and vitals reviewed.    UC Treatments / Results  Labs (all labs ordered are listed, but only abnormal results are displayed) Labs Reviewed - No data to display  EKG  EKG Interpretation None       Radiology No results found.  Procedures Procedures (including critical care time)  Medications Ordered in UC Medications - No data to display   Initial Impression / Assessment and Plan / UC Course  I have reviewed the triage vital signs and the nursing notes.  Pertinent labs & imaging results that were available during my care of the patient were reviewed by me and considered in my medical decision making (see chart for details).  Clinical Course      Final Clinical Impressions(s) / UC Diagnoses   Final diagnoses:  None    New Prescriptions New Prescriptions   No medications on file     Billy Fischer, MD 04/26/16 1254

## 2016-04-29 ENCOUNTER — Emergency Department (HOSPITAL_COMMUNITY)
Admission: EM | Admit: 2016-04-29 | Discharge: 2016-04-29 | Disposition: A | Discharge status: Home / Self Care | Payer: Self-pay | Attending: Emergency Medicine | Admitting: Emergency Medicine

## 2016-04-29 ENCOUNTER — Emergency Department (HOSPITAL_COMMUNITY): Payer: Self-pay

## 2016-04-29 ENCOUNTER — Encounter (HOSPITAL_COMMUNITY): Payer: Self-pay | Admitting: Physical Medicine and Rehabilitation

## 2016-04-29 DIAGNOSIS — I10 Essential (primary) hypertension: Secondary | ICD-10-CM | POA: Insufficient documentation

## 2016-04-29 DIAGNOSIS — J069 Acute upper respiratory infection, unspecified: Secondary | ICD-10-CM | POA: Insufficient documentation

## 2016-04-29 DIAGNOSIS — J019 Acute sinusitis, unspecified: Secondary | ICD-10-CM | POA: Insufficient documentation

## 2016-04-29 DIAGNOSIS — Z79899 Other long term (current) drug therapy: Secondary | ICD-10-CM | POA: Insufficient documentation

## 2016-04-29 DIAGNOSIS — J45909 Unspecified asthma, uncomplicated: Secondary | ICD-10-CM | POA: Insufficient documentation

## 2016-04-29 MED ORDER — AZITHROMYCIN 250 MG PO TABS: 250.0000 mg | ORAL_TABLET | Freq: Every day | ORAL | 0 refills | Status: DC

## 2016-04-29 MED ORDER — BENZONATATE 100 MG PO CAPS: 200.0000 mg | ORAL_CAPSULE | Freq: Two times a day (BID) | ORAL | 0 refills | Status: DC | PRN

## 2016-04-29 MED ORDER — OXYMETAZOLINE HCL 0.05 % NA SOLN: 1.0000 | Freq: Two times a day (BID) | NASAL | 0 refills | Status: DC

## 2016-04-29 NOTE — Discharge Instructions (Signed)
Take your medications as prescribed. I recommend continuing to drink fluids at home to remain hydrated. You may also take an over-the-counter decongestion if your nasal spray is not sufficient enough. Follow-up with your primary care provider in the next week if your symptoms have not improved. Please return to the Emergency Department if symptoms worsen or new onset of fever, facial swelling, difficulty breathing, coughing up blood, chest pain, vomiting, unable to keep fluids down, headache, neck stiffness.

## 2016-04-29 NOTE — ED Provider Notes (Signed)
[place] DEPT Provider Note   CSN: [id number] Arrival date & time: 04/29/16  1036     History   Chief Complaint Chief Complaint  Patient presents with  . Cough    HPI Angel Hebert is a 60 y.o. female.  Patient is a 60 year old female with past medical history of hypertension and asthma who presents the ED with complaint of cough, onset 1.5 weeks. Patient reports she has had a worsening dry cough with associated nasal congestion, rhinorrhea and sinus pressure. She notes she has been taking medications over-the-counter without relief. Patient states she was seen at an urgent care on 8/25 for similar symptoms and was discharged home with no spray and prednisone without relief. Denies fever, chills, headache, ear pain, sore throat, hemoptysis, shortness of breath, wheezing, chest pain, palpitations, abdominal pain, vomiting, headache, neck stiffness. Pt denies any known sick contacts. Patient states she had pneumonia in February and is concerned that she has pneumonia again.      Past Medical History:  Diagnosis Date  . Asthma   . GERD (gastroesophageal reflux disease)   . High cholesterol   . Hypertension     Patient Active Problem List   Diagnosis Date Noted  . Primary localized osteoarthritis of knee 12/13/2015  . Benign essential HTN   . Physical deconditioning   . Anemia, iron deficiency   . Pressure ulcer 10/14/2015  . CAP (community acquired pneumonia)   . Pleural effusion   . Sepsis ([place])   . Sepsis due to Streptococcus pneumoniae ([place])   . Bacteremia   . Community acquired pneumonia 10/08/2015  . Acute sinusitis 12/02/2014  . Sinusitis 03/17/2013  . HYPERLIPIDEMIA 03/02/2007  . HYPERTENSION 03/02/2007  . ALLERGIC RHINITIS 03/02/2007  . ASTHMA 03/02/2007  . GERD 03/02/2007    Past Surgical History:  Procedure Laterality Date  . ABDOMINAL HYSTERECTOMY      OB History    No data available       Home Medications    Prior to Admission medications    Medication Sig Start Date End Date Taking? Authorizing Provider  atorvastatin (LIPITOR) 10 MG tablet TAKE 1 TABLET BY MOUTH DAILY AT 6 PM FOR CHOLESTEROL 04/18/16  Yes Olugbemiga E Doreene Burke, MD  cetirizine (ZYRTEC) 10 MG tablet TAKE 1 TABLET BY MOUTH DAILY. 03/01/16  Yes Tresa Garter, MD  fluticasone (FLONASE) 50 MCG/ACT nasal spray Place 1 spray into both nostrils 2 (two) times daily. 04/26/16  Yes Billy Fischer, MD  Fluticasone-Salmeterol (ADVAIR) 500-50 MCG/DOSE AEPB Inhale 1 puff into the lungs every 12 (twelve) hours. 07/24/15  Yes Lance Bosch, NP  hydrochlorothiazide (HYDRODIURIL) 25 MG tablet TAKE 1/2 TABLET BY MOUTH DAILY FOR BLOOD PRESSURE 04/22/16  Yes Tresa Garter, MD  ibuprofen (ADVIL,MOTRIN) 200 MG tablet Take 200 mg by mouth every 6 (six) hours as needed for mild pain.   Yes Historical Provider, MD  ipratropium (ATROVENT) 0.06 % nasal spray Place 2 sprays into both nostrils 4 (four) times daily. 04/26/16  Yes Billy Fischer, MD  irbesartan (AVAPRO) 150 MG tablet Take 1 tablet (150 mg total) by mouth daily after breakfast. For blood pressure 07/24/15  Yes Lance Bosch, NP  montelukast (SINGULAIR) 10 MG tablet TAKE 1 TABLET BY MOUTH AT BEDTIME. 04/22/16  Yes Olugbemiga E Doreene Burke, MD  pantoprazole (PROTONIX) 20 MG tablet Take 1 tablet (20 mg total) by mouth daily. 07/24/15  Yes Lance Bosch, NP  traZODone (DESYREL) 50 MG tablet TAKE 1 TABLET BY MOUTH AT  BEDTIME. 04/22/16  Yes Olugbemiga E Doreene Burke, MD  VENTOLIN HFA 108 (90 Base) MCG/ACT inhaler INHALE 1-2 PUFFS INTO THE LUNGS EVERY 6 HOURS AS NEEDED FOR WHEEZING. 04/22/16  Yes Tresa Garter, MD  amoxicillin-clavulanate (AUGMENTIN) 875-125 MG tablet Take 1 tablet by mouth 2 (two) times daily. 02/20/16   Melony Overly, MD  azithromycin (ZITHROMAX) 250 MG tablet Take 1 tablet (250 mg total) by mouth daily. Take first 2 tablets together, then 1 every day until finished. 04/29/16   Nona Dell, PA-C  benzonatate  (TESSALON) 100 MG capsule Take 2 capsules (200 mg total) by mouth 2 (two) times daily as needed for cough. 04/29/16   Nona Dell, PA-C  oxymetazoline (AFRIN NASAL SPRAY) 0.05 % nasal spray Place 1 spray into both nostrils 2 (two) times daily. 04/29/16   Nona Dell, PA-C    Family History Family History  Problem Relation Age of Onset  . Colon cancer Neg Hx     Social History Social History  Substance Use Topics  . Smoking status: Never Smoker  . Smokeless tobacco: Never Used  . Alcohol use No     Allergies   Review of patient's allergies indicates no known allergies.   Review of Systems Review of Systems  HENT: Positive for congestion, rhinorrhea and sinus pressure.   Respiratory: Positive for cough.   All other systems reviewed and are negative.    Physical Exam Updated Vital Signs BP 123/76 (BP Location: Right Arm)   Pulse 87   Temp 97.8 F (36.6 C) (Oral)   Resp 20   Ht 5\' 7"  (1.702 m)   Wt 68 kg   SpO2 100%   BMI 23.49 kg/m   Physical Exam  Constitutional: She is oriented to person, place, and time. She appears well-developed and well-nourished. No distress.  HENT:  Head: Normocephalic and atraumatic.  Right Ear: Tympanic membrane normal.  Left Ear: Tympanic membrane normal.  Nose: Mucosal edema and rhinorrhea present. Right sinus exhibits no maxillary sinus tenderness and no frontal sinus tenderness. Left sinus exhibits no maxillary sinus tenderness and no frontal sinus tenderness.  Mouth/Throat: Uvula is midline and oropharynx is clear and moist. No oropharyngeal exudate, posterior oropharyngeal edema, posterior oropharyngeal erythema or tonsillar abscesses. No tonsillar exudate.  Eyes: Conjunctivae and EOM are normal. Pupils are equal, round, and reactive to light. Right eye exhibits no discharge. Left eye exhibits no discharge. No scleral icterus.  Neck: Normal range of motion. Neck supple.  Cardiovascular: Normal rate, regular  rhythm, normal heart sounds and intact distal pulses.   Pulmonary/Chest: Effort normal and breath sounds normal. No respiratory distress. She has no wheezes. She has no rales. She exhibits no tenderness.  Abdominal: Soft. Bowel sounds are normal. She exhibits no distension and no mass. There is no tenderness. There is no rebound and no guarding. No hernia.  Musculoskeletal: Normal range of motion. She exhibits no edema or tenderness.  Lymphadenopathy:    She has no cervical adenopathy.  Neurological: She is alert and oriented to person, place, and time.  Skin: Skin is warm and dry. She is not diaphoretic.  Nursing note and vitals reviewed.    ED Treatments / Results  Labs (all labs ordered are listed, but only abnormal results are displayed) Labs Reviewed - No data to display  EKG  EKG Interpretation None       Radiology Dg Chest 2 View  Result Date: 04/29/2016 CLINICAL DATA:  Pt reports sinus congestion and pain. Ongoing  for 2 weeks. Also states dry cough. Respirations unlabored. Pt concerned she could have pneumonia. EXAM: CHEST  2 VIEW COMPARISON:  10/12/2015 and 10/20/2013 FINDINGS: Cardiac silhouette is normal in size. No mediastinal or hilar masses or evidence of adenopathy. Mild prominence of bronchovascular markings, most evident in the lung bases, increased when compared to a chest radiograph dated 10/20/2013. Findings may reflect acute bronchial infection/ inflammation. There is no convincing pulmonary edema. There is no consolidation to suggest pneumonia. No pleural effusion or pneumothorax. Bony thorax is demineralized but intact. IMPRESSION: 1. No evidence of pneumonia or pulmonary edema. 2. Prominence of the bronchovascular markings suggests acute bronchial infection or inflammation given the history of cough. Electronically Signed   By: Lajean Manes M.D.   On: 04/29/2016 11:08    Procedures Procedures (including critical care time)  Medications Ordered in  ED Medications - No data to display   Initial Impression / Assessment and Plan / ED Course  I have reviewed the triage vital signs and the nursing notes.  Pertinent labs & imaging results that were available during my care of the patient were reviewed by me and considered in my medical decision making (see chart for details).  Clinical Course    Asian presents with cough, nasal congestion, rhinorrhea and sinus pressure for the past week that has worsened. She was seen at urgent care and prescribed nasal spray and steroids without relief. Denies fever, shortness of breath or chest pain. VSS. Exam revealed mucosal edema and rhinorrhea, lungs clear to auscultation bilaterally, no evidence of respiratory distress on exam, remaining exam unremarkable. Chest x-ray revealed bronchial vascular markings suggesting acute bronchial infection or inflammation. Due to patient without wheezing or shortness of breath on exam I suspect patient's symptoms are likely due to viral respiratory infection. Plan to d/c patient home with symptomatic treatment.  Patient also complaining of symptoms of sinusitis. Moderate symptoms have been present for greater than 10 days with purulent nasal discharge.  Concern for acute bacterial rhinosinusitis.  Patient discharged with Azithromycin.  Instructions given for warm saline nasal wash and recommendations for follow-up with primary care physician.  Discussed results and plan for discharge with patient. Discussed return precautions.   Final Clinical Impressions(s) / ED Diagnoses   Final diagnoses:  Acute sinusitis, recurrence not specified, unspecified location  URI (upper respiratory infection)    New Prescriptions New Prescriptions   AZITHROMYCIN (ZITHROMAX) 250 MG TABLET    Take 1 tablet (250 mg total) by mouth daily. Take first 2 tablets together, then 1 every day until finished.   BENZONATATE (TESSALON) 100 MG CAPSULE    Take 2 capsules (200 mg total) by mouth 2  (two) times daily as needed for cough.   OXYMETAZOLINE (AFRIN NASAL SPRAY) 0.05 % NASAL SPRAY    Place 1 spray into both nostrils 2 (two) times daily.     Chesley Noon [place], [place] 04/29/16 [place], MD 04/29/16 [phone]

## 2016-04-29 NOTE — ED Notes (Signed)
Pt states she understands instructions. Home stable , steady gait with husband.

## 2016-04-29 NOTE — ED Triage Notes (Signed)
Pt reports sinus congestion and pain. Ongoing for several day. Also states dry cough. Respirations unlabored. Pt concerned she could have pneumonia.

## 2016-04-29 NOTE — ED Notes (Signed)
Pt oob to [facility] with steaDY GAIT.

## 2016-05-02 ENCOUNTER — Ambulatory Visit (HOSPITAL_COMMUNITY)
Admission: EM | Admit: 2016-05-02 | Discharge: 2016-05-02 | Disposition: A | Discharge status: Home / Self Care | Payer: No Typology Code available for payment source | Attending: Family | Admitting: Family

## 2016-05-02 ENCOUNTER — Encounter (HOSPITAL_COMMUNITY): Payer: Self-pay | Admitting: Emergency Medicine

## 2016-05-02 DIAGNOSIS — R05 Cough: Secondary | ICD-10-CM

## 2016-05-02 DIAGNOSIS — J0110 Acute frontal sinusitis, unspecified: Secondary | ICD-10-CM

## 2016-05-02 DIAGNOSIS — R059 Cough, unspecified: Secondary | ICD-10-CM

## 2016-05-02 MED ORDER — PREDNISONE 10 MG (21) PO TBPK: ORAL_TABLET | ORAL | 0 refills | Status: DC

## 2016-05-02 MED ORDER — AMOXICILLIN-POT CLAVULANATE 875-125 MG PO TABS: 1.0000 | ORAL_TABLET | Freq: Two times a day (BID) | ORAL | 0 refills | Status: AC

## 2016-05-02 MED ORDER — HYDROCOD POLST-CPM POLST ER 10-8 MG/5ML PO SUER: 5.0000 mL | Freq: Every evening | ORAL | 0 refills | Status: DC | PRN

## 2016-05-02 NOTE — ED Triage Notes (Signed)
Pt c/o cold sx onset x1 week... Reports she was seen at [facility] ER and [facility] for similar sx   Was given a Z pack, tessalon caps, and Afrin nasal spray w/no relief. On 8/28  Sx today include: dry cough, congestion, and runny nose  A&O x4... NAD

## 2016-05-02 NOTE — Discharge Instructions (Signed)
Start Augmentin twice a day as directed. Start Prednisone as directed. Use Tussionex cough syrup at night to help with cough. Continue to increase fluid intake and rest. Follow-up with your primary care provider in 5 to 7 days if not improving.

## 2016-05-02 NOTE — ED Provider Notes (Signed)
CSN: [id number]     Arrival date & time 05/02/16  [id number] History   First MD Initiated Contact with Patient 05/02/16 1019     Chief Complaint  Patient presents with  . URI   (Consider location/radiation/quality/duration/timing/severity/associated sxs/prior Treatment) 60 year old female presents with nasal congestion, sinus pressure and cough for almost 2 weeks. Was seen here at Urgent Care on 04/26/16 and dx with nasal polps- no medication provided. She then went to the ER 2 days ago and was given an Rx for Z-pak and Tessalon perles due to possible bronchial infection on chest x-ray. She has a long standing history of nasal polyp inflammation and recurrent sinus infections that respond well to Augmentin and Prednisone. She also is not sleeping at night due to the cough and requests other cough medication since Tessalon perles are not helping.       Past Medical History:  Diagnosis Date  . Asthma   . GERD (gastroesophageal reflux disease)   . High cholesterol   . Hypertension    Past Surgical History:  Procedure Laterality Date  . ABDOMINAL HYSTERECTOMY     Family History  Problem Relation Age of Onset  . Colon cancer Neg Hx    Social History  Substance Use Topics  . Smoking status: Never Smoker  . Smokeless tobacco: Never Used  . Alcohol use No   OB History    No data available     Review of Systems  Constitutional: Positive for fatigue. Negative for chills and fever.  HENT: Positive for congestion, rhinorrhea, sinus pressure and sore throat. Negative for ear pain.   Respiratory: Positive for cough and wheezing. Negative for chest tightness and shortness of breath.   Cardiovascular: Negative for chest pain.  Gastrointestinal: Negative for diarrhea, nausea and vomiting.  Neurological: Positive for headaches. Negative for dizziness, syncope, weakness and numbness.    Allergies  Review of patient's allergies indicates no known allergies.  Home Medications   Prior to  Admission medications   Medication Sig Start Date End Date Taking? Authorizing Provider  atorvastatin (LIPITOR) 10 MG tablet TAKE 1 TABLET BY MOUTH DAILY AT 6 PM FOR CHOLESTEROL 04/18/16  Yes Olugbemiga E Doreene Burke, MD  azithromycin (ZITHROMAX) 250 MG tablet Take 1 tablet (250 mg total) by mouth daily. Take first 2 tablets together, then 1 every day until finished. 04/29/16  Yes Chesley Noon Nadeau, PA-C  benzonatate (TESSALON) 100 MG capsule Take 2 capsules (200 mg total) by mouth 2 (two) times daily as needed for cough. 04/29/16  Yes Nona Dell, PA-C  cetirizine (ZYRTEC) 10 MG tablet TAKE 1 TABLET BY MOUTH DAILY. 03/01/16  Yes Tresa Garter, MD  fluticasone (FLONASE) 50 MCG/ACT nasal spray Place 1 spray into both nostrils 2 (two) times daily. 04/26/16  Yes Billy Fischer, MD  Fluticasone-Salmeterol (ADVAIR) 500-50 MCG/DOSE AEPB Inhale 1 puff into the lungs every 12 (twelve) hours. 07/24/15  Yes Lance Bosch, NP  hydrochlorothiazide (HYDRODIURIL) 25 MG tablet TAKE 1/2 TABLET BY MOUTH DAILY FOR BLOOD PRESSURE 04/22/16  Yes Tresa Garter, MD  ipratropium (ATROVENT) 0.06 % nasal spray Place 2 sprays into both nostrils 4 (four) times daily. 04/26/16  Yes Billy Fischer, MD  montelukast (SINGULAIR) 10 MG tablet TAKE 1 TABLET BY MOUTH AT BEDTIME. 04/22/16  Yes Olugbemiga E Doreene Burke, MD  VENTOLIN HFA 108 (90 Base) MCG/ACT inhaler INHALE 1-2 PUFFS INTO THE LUNGS EVERY 6 HOURS AS NEEDED FOR WHEEZING. 04/22/16  Yes Tresa Garter, MD  amoxicillin-clavulanate (AUGMENTIN) 875-125 MG tablet Take 1 tablet by mouth every 12 (twelve) hours. 05/02/16 05/09/16  Katy Apo, NP  chlorpheniramine-HYDROcodone [facility] PENNKINETIC ER) 10-8 MG/5ML SUER Take 5 mLs by mouth at bedtime as needed for cough. 05/02/16   Katy Apo, NP  ibuprofen (ADVIL,MOTRIN) 200 MG tablet Take 200 mg by mouth every 6 (six) hours as needed for mild pain.    Historical Provider, MD  irbesartan (AVAPRO) 150 MG tablet  Take 1 tablet (150 mg total) by mouth daily after breakfast. For blood pressure 07/24/15   Lance Bosch, NP  pantoprazole (PROTONIX) 20 MG tablet Take 1 tablet (20 mg total) by mouth daily. 07/24/15   Lance Bosch, NP  predniSONE (STERAPRED UNI-PAK 21 TAB) 10 MG (21) TBPK tablet Take 6 tabs by mouth daily  for 2 days, then 5 tabs for 2 days, then 4 tabs for 2 days, then 3 tabs for 2 days, 2 tabs for 2 days, then 1 tab by mouth daily for 2 days 05/02/16   Katy Apo, NP  traZODone (DESYREL) 50 MG tablet TAKE 1 TABLET BY MOUTH AT BEDTIME. 04/22/16   Tresa Garter, MD   Meds Ordered and Administered this Visit  Medications - No data to display  BP 156/89 (BP Location: Left Arm)   Pulse 103   Temp 98.3 F (36.8 C) (Oral)   Resp 18   SpO2 97%  No data found.   Physical Exam  Constitutional: She is oriented to person, place, and time. She appears well-developed and well-nourished. No distress.  HENT:  Head: Normocephalic and atraumatic.  Right Ear: Hearing, tympanic membrane, external ear and ear canal normal.  Left Ear: Hearing, tympanic membrane, external ear and ear canal normal.  Nose: Mucosal edema, rhinorrhea (with yellow discharge) and sinus tenderness present. Right sinus exhibits frontal sinus tenderness. Right sinus exhibits no maxillary sinus tenderness. Left sinus exhibits frontal sinus tenderness. Left sinus exhibits no maxillary sinus tenderness.  Mouth/Throat: Uvula is midline and mucous membranes are normal. Abnormal dentition. Posterior oropharyngeal erythema (with yellow post nasal drainage) present.  Neck: Normal range of motion. Neck supple.  Cardiovascular: Normal rate, regular rhythm and normal heart sounds.   Pulmonary/Chest: Effort normal. She has no decreased breath sounds. She has wheezes. She has no rhonchi.  Lymphadenopathy:    She has no cervical adenopathy.  Neurological: She is alert and oriented to person, place, and time.  Skin: Skin is warm and  dry.  Psychiatric: She has a normal mood and affect. Her behavior is normal. Judgment and thought content normal.    Urgent Care Course   Clinical Course    Procedures (including critical care time)  Labs Review Labs Reviewed - No data to display  Imaging Review No results found.   Visual Acuity Review  Right Eye Distance:   Left Eye Distance:   Bilateral Distance:    Right Eye Near:   Left Eye Near:    Bilateral Near:         MDM   1. Acute frontal sinusitis, recurrence not specified   2. Cough    Recommend start Augmentin 875mg  twice a day as directed. She took last dose of Z-pak today. Recommend start Prednisone 10mg  12 day dose pack as directed. May continue using Flonase. Stop Afrin. Continue Tessalon perles during the day. May start Tussionex 1 teaspoon at night for cough. [place] Controlled medication Registry reviewed with no active or recent Rx for patient. Continue inhaler medication  for asthma. Rest. Follow-up with her primary care provider if symptoms do not improve within 7 days.     Katy Apo, NP 05/03/16 1253

## 2016-05-15 ENCOUNTER — Other Ambulatory Visit: Payer: Self-pay | Admitting: Internal Medicine

## 2016-05-15 DIAGNOSIS — I10 Essential (primary) hypertension: Secondary | ICD-10-CM

## 2016-05-15 DIAGNOSIS — E785 Hyperlipidemia, unspecified: Secondary | ICD-10-CM

## 2016-05-15 DIAGNOSIS — K219 Gastro-esophageal reflux disease without esophagitis: Secondary | ICD-10-CM

## 2016-05-20 ENCOUNTER — Encounter: Payer: Self-pay | Admitting: Family Medicine

## 2016-05-20 ENCOUNTER — Ambulatory Visit: Payer: Self-pay | Attending: Family Medicine | Admitting: Family Medicine

## 2016-05-20 VITALS — BP 99/65 | HR 90 | Temp 98.7°F | Resp 18 | Ht 67.0 in | Wt 163.8 lb

## 2016-05-20 DIAGNOSIS — J4530 Mild persistent asthma, uncomplicated: Secondary | ICD-10-CM

## 2016-05-20 DIAGNOSIS — K219 Gastro-esophageal reflux disease without esophagitis: Secondary | ICD-10-CM

## 2016-05-20 DIAGNOSIS — Z23 Encounter for immunization: Secondary | ICD-10-CM | POA: Insufficient documentation

## 2016-05-20 DIAGNOSIS — Z0000 Encounter for general adult medical examination without abnormal findings: Secondary | ICD-10-CM

## 2016-05-20 DIAGNOSIS — E785 Hyperlipidemia, unspecified: Secondary | ICD-10-CM

## 2016-05-20 DIAGNOSIS — I10 Essential (primary) hypertension: Secondary | ICD-10-CM

## 2016-05-20 DIAGNOSIS — Z79899 Other long term (current) drug therapy: Secondary | ICD-10-CM | POA: Insufficient documentation

## 2016-05-20 DIAGNOSIS — J302 Other seasonal allergic rhinitis: Secondary | ICD-10-CM

## 2016-05-20 DIAGNOSIS — J0101 Acute recurrent maxillary sinusitis: Secondary | ICD-10-CM

## 2016-05-20 MED ORDER — CETIRIZINE HCL 10 MG PO TABS: 10.0000 mg | ORAL_TABLET | Freq: Every day | ORAL | 5 refills | Status: DC

## 2016-05-20 MED ORDER — FLUTICASONE PROPIONATE 50 MCG/ACT NA SUSP: 1.0000 | Freq: Two times a day (BID) | NASAL | 5 refills | Status: DC

## 2016-05-20 MED ORDER — PANTOPRAZOLE SODIUM 20 MG PO TBEC: 20.0000 mg | DELAYED_RELEASE_TABLET | Freq: Every day | ORAL | 5 refills | Status: DC

## 2016-05-20 MED ORDER — HYDROCHLOROTHIAZIDE 25 MG PO TABS: ORAL_TABLET | ORAL | 5 refills | Status: DC

## 2016-05-20 MED ORDER — ATORVASTATIN CALCIUM 10 MG PO TABS: ORAL_TABLET | ORAL | 5 refills | Status: DC

## 2016-05-20 MED ORDER — IRBESARTAN 150 MG PO TABS: ORAL_TABLET | ORAL | 5 refills | Status: DC

## 2016-05-20 MED ORDER — ALBUTEROL SULFATE HFA 108 (90 BASE) MCG/ACT IN AERS: INHALATION_SPRAY | RESPIRATORY_TRACT | 5 refills | Status: DC

## 2016-05-20 MED ORDER — TRAZODONE HCL 50 MG PO TABS: 50.0000 mg | ORAL_TABLET | Freq: Every day | ORAL | 5 refills | Status: DC

## 2016-05-20 MED ORDER — HYDROCOD POLST-CPM POLST ER 10-8 MG/5ML PO SUER: 5.0000 mL | Freq: Every evening | ORAL | 0 refills | Status: DC | PRN

## 2016-05-20 MED ORDER — FLUTICASONE-SALMETEROL 500-50 MCG/DOSE IN AEPB: 1.0000 | INHALATION_SPRAY | Freq: Two times a day (BID) | RESPIRATORY_TRACT | 5 refills | Status: DC

## 2016-05-20 MED ORDER — MONTELUKAST SODIUM 10 MG PO TABS: 10.0000 mg | ORAL_TABLET | Freq: Every day | ORAL | 5 refills | Status: DC

## 2016-05-20 NOTE — Progress Notes (Signed)
Subjective:  Patient ID: Angel Hebert, female    DOB: [birth year]  Age: 60 y.o. MRN: [id number]  CC: Establish Care and Sinus Problem   HPI Angel Hebert presents Is a 60 year old female with a history of hypertension, hyperlipidemia, asthma, allergic rhinitis, nasal polyps, GERD who presents to the clinic to establish care with me.  She complains of nasal congestion, postnasal drip and cough which keeps her awake at night and postnasal drip is worse when she lies flat. Denies shortness of breath, wheezing or chest pains. Review of her chart indicates she was prescribed Z-Pak and a 10 day prednisone taper on 05/02/16. She denies fever.  Blood pressure is on the low side and she denies lightheadedness. Has been compliant with all her medications and Would like to have a refill of her medications today.  Past Medical History:  Diagnosis Date  . Asthma   . GERD (gastroesophageal reflux disease)   . High cholesterol   . Hypertension     Past Surgical History:  Procedure Laterality Date  . ABDOMINAL HYSTERECTOMY       Outpatient Medications Prior to Visit  Medication Sig Dispense Refill  . ipratropium (ATROVENT) 0.06 % nasal spray Place 2 sprays into both nostrils 4 (four) times daily. 15 mL 1  . atorvastatin (LIPITOR) 10 MG tablet TAKE 1 TABLET BY MOUTH DAILY AT 6 PM FOR CHOLESTEROL 30 tablet 0  . azithromycin (ZITHROMAX) 250 MG tablet Take 1 tablet (250 mg total) by mouth daily. Take first 2 tablets together, then 1 every day until finished. 6 tablet 0  . benzonatate (TESSALON) 100 MG capsule Take 2 capsules (200 mg total) by mouth 2 (two) times daily as needed for cough. 20 capsule 0  . cetirizine (ZYRTEC) 10 MG tablet TAKE 1 TABLET BY MOUTH DAILY. 30 tablet 0  . chlorpheniramine-HYDROcodone (TUSSIONEX PENNKINETIC ER) 10-8 MG/5ML SUER Take 5 mLs by mouth at bedtime as needed for cough. 60 mL 0  . fluticasone (FLONASE) 50 MCG/ACT nasal spray Place 1 spray into both nostrils 2 (two) times  daily. 1 g 2  . Fluticasone-Salmeterol (ADVAIR) 500-50 MCG/DOSE AEPB Inhale 1 puff into the lungs every 12 (twelve) hours. 60 each 4  . hydrochlorothiazide (HYDRODIURIL) 25 MG tablet TAKE 1/2 TABLET BY MOUTH DAILY FOR BLOOD PRESSURE 30 tablet 0  . ibuprofen (ADVIL,MOTRIN) 200 MG tablet Take 200 mg by mouth every 6 (six) hours as needed for mild pain.    Marland Kitchen irbesartan (AVAPRO) 150 MG tablet TAKE 1 TABLET BY MOUTH DAILY AFTER BREAKFAST FOR BLOOD PRESSURE 30 tablet 0  . montelukast (SINGULAIR) 10 MG tablet TAKE 1 TABLET BY MOUTH AT BEDTIME. 30 tablet 0  . pantoprazole (PROTONIX) 20 MG tablet TAKE 1 TABLET BY MOUTH DAILY. 30 tablet 0  . predniSONE (STERAPRED UNI-PAK 21 TAB) 10 MG (21) TBPK tablet Take 6 tabs by mouth daily  for 2 days, then 5 tabs for 2 days, then 4 tabs for 2 days, then 3 tabs for 2 days, 2 tabs for 2 days, then 1 tab by mouth daily for 2 days 42 tablet 0  . traZODone (DESYREL) 50 MG tablet TAKE 1 TABLET BY MOUTH AT BEDTIME. 30 tablet 0  . VENTOLIN HFA 108 (90 Base) MCG/ACT inhaler INHALE 1-2 PUFFS INTO THE LUNGS EVERY 6 HOURS AS NEEDED FOR WHEEZING. 18 g 2   No facility-administered medications prior to visit.     ROS Review of Systems  Constitutional: Negative for activity change, appetite change and fatigue.  HENT:  Positive for congestion and postnasal drip. Negative for sinus pressure and sore throat.   Eyes: Negative for visual disturbance.  Respiratory: Positive for cough. Negative for chest tightness, shortness of breath and wheezing.   Cardiovascular: Negative for chest pain and palpitations.  Gastrointestinal: Negative for abdominal distention, abdominal pain and constipation.  Endocrine: Negative for polydipsia.  Genitourinary: Negative for dysuria and frequency.  Musculoskeletal: Negative for arthralgias and back pain.  Skin: Negative for rash.  Neurological: Negative for tremors, light-headedness and numbness.  Hematological: Does not bruise/bleed easily.    Psychiatric/Behavioral: Negative for agitation and behavioral problems.    Objective:  BP 99/65 (BP Location: Right Arm, Patient Position: Sitting, Cuff Size: Large)   Pulse 90   Temp 98.7 F (37.1 C) (Oral)   Resp 18   Ht 5\' 7"  (1.702 m)   Wt 163 lb 12.8 oz (74.3 kg)   SpO2 98%   BMI 25.65 kg/m   BP/Weight 05/20/2016 05/02/2016 0000000  Systolic BP 99 [id number]  Diastolic BP 65 89 72  Wt. (Lbs) 163.8 - 150  BMI 25.65 - 23.49      Physical Exam  Constitutional: She is oriented to person, place, and time. She appears well-developed and well-nourished.  Cardiovascular: Normal rate, normal heart sounds and intact distal pulses.   No murmur heard. Pulmonary/Chest: Effort normal and breath sounds normal. She has no wheezes. She has no rales. She exhibits no tenderness.  Abdominal: Soft. Bowel sounds are normal. She exhibits no distension and no mass. There is no tenderness.  Musculoskeletal: Normal range of motion.  Neurological: She is alert and oriented to person, place, and time.     Assessment & Plan:   1. Healthcare maintenance - Flu Vaccine QUAD 36+ mos PF IM (Fluarix & Fluzone Quad PF)  2. HLD (hyperlipidemia) - Lipid panel; Future - atorvastatin (LIPITOR) 10 MG tablet; TAKE 1 TABLET BY MOUTH DAILY AT 6 PM FOR CHOLESTEROL  Dispense: 30 tablet; Refill: 5 - Lipid panel  3. Benign essential HTN Blood pressure is on the low side. Blood pressure from last visit was in the [id number] systolic and so I will make no changes to her regimen today - COMPLETE METABOLIC PANEL WITH GFR; Future - COMPLETE METABOLIC PANEL WITH GFR  4. Acute recurrent maxillary sinusitis Holding off on antibiotics as she recently completed a Z-Pak last month along with a prednisone taper Continue antihistamines  5. Asthma, mild persistent, uncomplicated No acute exacerbation - Fluticasone-Salmeterol (ADVAIR) 500-50 MCG/DOSE AEPB; Inhale 1 puff into the lungs every 12 (twelve) hours.  Dispense: 60  each; Refill: 5 - montelukast (SINGULAIR) 10 MG tablet; Take 1 tablet (10 mg total) by mouth at bedtime.  Dispense: 30 tablet; Refill: 5 - albuterol (VENTOLIN HFA) 108 (90 Base) MCG/ACT inhaler; INHALE 1-2 PUFFS INTO THE LUNGS EVERY 6 HOURS AS NEEDED FOR WHEEZING.  Dispense: 18 g; Refill: 5  6. Essential hypertension - hydrochlorothiazide (HYDRODIURIL) 25 MG tablet; TAKE 1/2 TABLET BY MOUTH DAILY FOR BLOOD PRESSURE  Dispense: 30 tablet; Refill: 5 - irbesartan (AVAPRO) 150 MG tablet; TAKE 1 TABLET BY MOUTH DAILY AFTER BREAKFAST FOR BLOOD PRESSURE  Dispense: 30 tablet; Refill: 5  7. Gastroesophageal reflux disease, esophagitis presence not specified Stable - pantoprazole (PROTONIX) 20 MG tablet; Take 1 tablet (20 mg total) by mouth daily.  Dispense: 30 tablet; Refill: 5  8. Other seasonal allergic rhinitis This could explain cough and postnasal drip - chlorpheniramine-HYDROcodone (TUSSIONEX PENNKINETIC ER) 10-8 MG/5ML SUER; Take 5 mLs by mouth at  bedtime as needed for cough.  Dispense: 60 mL; Refill: 0 - cetirizine (ZYRTEC) 10 MG tablet; Take 1 tablet (10 mg total) by mouth daily.  Dispense: 30 tablet; Refill: 5 - fluticasone (FLONASE) 50 MCG/ACT nasal spray; Place 1 spray into both nostrils 2 (two) times daily.  Dispense: 1 g; Refill: 5   Meds ordered this encounter  Medications  . chlorpheniramine-HYDROcodone (TUSSIONEX PENNKINETIC ER) 10-8 MG/5ML SUER    Sig: Take 5 mLs by mouth at bedtime as needed for cough.    Dispense:  60 mL    Refill:  0  . atorvastatin (LIPITOR) 10 MG tablet    Sig: TAKE 1 TABLET BY MOUTH DAILY AT 6 PM FOR CHOLESTEROL    Dispense:  30 tablet    Refill:  5    Must have office visit for refills  . cetirizine (ZYRTEC) 10 MG tablet    Sig: Take 1 tablet (10 mg total) by mouth daily.    Dispense:  30 tablet    Refill:  5    Must have office visit for refills  . fluticasone (FLONASE) 50 MCG/ACT nasal spray    Sig: Place 1 spray into both nostrils 2 (two) times  daily.    Dispense:  1 g    Refill:  5  . hydrochlorothiazide (HYDRODIURIL) 25 MG tablet    Sig: TAKE 1/2 TABLET BY MOUTH DAILY FOR BLOOD PRESSURE    Dispense:  30 tablet    Refill:  5    Must have office visit for refills  . Fluticasone-Salmeterol (ADVAIR) 500-50 MCG/DOSE AEPB    Sig: Inhale 1 puff into the lungs every 12 (twelve) hours.    Dispense:  60 each    Refill:  5  . montelukast (SINGULAIR) 10 MG tablet    Sig: Take 1 tablet (10 mg total) by mouth at bedtime.    Dispense:  30 tablet    Refill:  5    Must have office visit for refills  . irbesartan (AVAPRO) 150 MG tablet    Sig: TAKE 1 TABLET BY MOUTH DAILY AFTER BREAKFAST FOR BLOOD PRESSURE    Dispense:  30 tablet    Refill:  5    Must have office visit for refills  . pantoprazole (PROTONIX) 20 MG tablet    Sig: Take 1 tablet (20 mg total) by mouth daily.    Dispense:  30 tablet    Refill:  5    Must have office visit for refills  . traZODone (DESYREL) 50 MG tablet    Sig: Take 1 tablet (50 mg total) by mouth at bedtime.    Dispense:  30 tablet    Refill:  5    Must have office visit for refills  . albuterol (VENTOLIN HFA) 108 (90 Base) MCG/ACT inhaler    Sig: INHALE 1-2 PUFFS INTO THE LUNGS EVERY 6 HOURS AS NEEDED FOR WHEEZING.    Dispense:  18 g    Refill:  5    Follow-up: Return in about 2 weeks (around 06/03/2016) for follow up of sinusitis.   Arnoldo Morale MD

## 2016-05-20 NOTE — Progress Notes (Signed)
Patient is here for SINUS FU  Patient complains of medications providing minimal relief for sinus concerns.  Patient has taken medication today and patient has not eaten today.  Patient would like the flu vaccine today and pneumococcal if possible.

## 2016-05-20 NOTE — Patient Instructions (Signed)

## 2016-05-21 LAB — LIPID PANEL
CHOL/HDL RATIO: 2.5 ratio (ref <=5.0)
Cholesterol: 185 mg/dL (ref 125–200)
HDL: 74 mg/dL (ref >=46)
LDL Cholesterol: 85 mg/dL (ref <130)
TRIGLYCERIDES: 129 mg/dL (ref <150)
VLDL: 26 mg/dL (ref <30)

## 2016-05-21 LAB — COMPLETE METABOLIC PANEL WITH GFR
ALBUMIN: 4.4 g/dL (ref 3.6–5.1)
ALK PHOS: 89 U/L (ref 33–130)
ALT: 7 U/L (ref 6–29)
AST: 17 U/L (ref 10–35)
BILIRUBIN TOTAL: 0.8 mg/dL (ref 0.2–1.2)
BUN: 5 mg/dL — ABNORMAL LOW (ref 7–25)
CO2: 28 mmol/L (ref 20–31)
Calcium: 9.8 mg/dL (ref 8.6–10.4)
Chloride: 95 mmol/L — ABNORMAL LOW (ref 98–110)
Creat: 0.55 mg/dL (ref 0.50–1.05)
Glucose, Bld: 92 mg/dL (ref 65–99)
POTASSIUM: 3.6 mmol/L (ref 3.5–5.3)
SODIUM: 133 mmol/L — AB (ref 135–146)
TOTAL PROTEIN: 7.4 g/dL (ref 6.1–8.1)

## 2016-05-22 ENCOUNTER — Telehealth: Payer: Self-pay

## 2016-05-22 NOTE — Telephone Encounter (Signed)
-----   Message from Arnoldo Morale, MD sent at 05/21/2016  4:50 PM EDT ----- Lipids are stable, lipids are normal.

## 2016-05-22 NOTE — Telephone Encounter (Signed)
Patient hipaa verif; patient advised of recent lipid results and no further questions at this time. Priscille Heidelberg, RN, BSN

## 2016-05-24 ENCOUNTER — Emergency Department (HOSPITAL_COMMUNITY)
Admission: EM | Admit: 2016-05-24 | Discharge: 2016-05-24 | Disposition: A | Discharge status: Home Health | Payer: Self-pay | Attending: Emergency Medicine | Admitting: Emergency Medicine

## 2016-05-24 ENCOUNTER — Encounter (HOSPITAL_COMMUNITY): Payer: Self-pay | Admitting: Emergency Medicine

## 2016-05-24 ENCOUNTER — Emergency Department (HOSPITAL_COMMUNITY): Payer: Self-pay

## 2016-05-24 DIAGNOSIS — Z79899 Other long term (current) drug therapy: Secondary | ICD-10-CM | POA: Insufficient documentation

## 2016-05-24 DIAGNOSIS — I10 Essential (primary) hypertension: Secondary | ICD-10-CM | POA: Insufficient documentation

## 2016-05-24 DIAGNOSIS — J189 Pneumonia, unspecified organism: Secondary | ICD-10-CM | POA: Insufficient documentation

## 2016-05-24 MED ORDER — DOXYCYCLINE HYCLATE 100 MG PO CAPS: 100.0000 mg | ORAL_CAPSULE | Freq: Two times a day (BID) | ORAL | 0 refills | Status: DC

## 2016-05-24 MED ORDER — OXYMETAZOLINE HCL 0.05 % NA SOLN: 1.0000 | Freq: Two times a day (BID) | NASAL | 0 refills | Status: DC

## 2016-05-24 MED ORDER — BENZONATATE 100 MG PO CAPS: 200.0000 mg | ORAL_CAPSULE | Freq: Two times a day (BID) | ORAL | 0 refills | Status: DC | PRN

## 2016-05-24 NOTE — ED Triage Notes (Signed)
Pt sts productive cough with yellow sputum x 3 days and congestion

## 2016-05-24 NOTE — Discharge Instructions (Signed)
Take your antibiotic and medications as prescribed. I recommend using albuterol inhaler at home as needed for shortness of breath/wheezing. Continue drinking fluids at home to remain hydrated. Follow-up with your primary care provider in the next 5-7 days. Please return to the Emergency Department if symptoms worsen or new onset of worsening shortness of breath/difficulty breathing, chest pain, coughing up blood, decreased oral intake, weakness, syncope.

## 2016-05-24 NOTE — ED Provider Notes (Signed)
[facility] DEPT Provider Note   CSN: [id number] Arrival date & time: 05/24/16  1049  By signing my name below, I, Dora Sims, attest that this documentation has been prepared under the direction and in the presence of Harlene Ramus, [place]. Electronically Signed: Dora Sims, Scribe. 05/24/2016. 11:51 AM.  History   Chief Complaint Chief Complaint  Patient presents with  . Cough    The history is provided by the patient. No language interpreter was used.     HPI Comments: Angel Hebert is a 60 y.o. female with PMHx of asthma, GERD, HLD, and HTN who presents to the Emergency Department complaining of sudden onset, constant, productive cough with yellow mucous for the last week. Pt notes associated congestion, rhinorrhea, and mild SOB. Pt denies being SOB upon arrival to the ED. Pt has not tried any medications for these symptoms. She denies sick contacts with similar symptoms. She is not currently on any antibiotics. Pt has no known allergies to medications. She denies recent hospitalizations. Pt further denies abdominal pain, nausea, vomiting, hemoptysis, chest pain, wheezing, fever, sore throat, ear pain, or any other associated symptoms. She is followed at [facility].  Past Medical History:  Diagnosis Date  . Asthma   . GERD (gastroesophageal reflux disease)   . High cholesterol   . Hypertension     Patient Active Problem List   Diagnosis Date Noted  . Primary localized osteoarthritis of knee 12/13/2015  . Benign essential HTN   . Physical deconditioning   . Anemia, iron deficiency   . Pressure ulcer 10/14/2015  . CAP (community acquired pneumonia)   . Pleural effusion   . Sepsis ([place])   . Sepsis due to Streptococcus pneumoniae ([place])   . Bacteremia   . Community acquired pneumonia 10/08/2015  . Acute sinusitis 12/02/2014  . Sinusitis 03/17/2013  . Hyperlipidemia 03/02/2007  . HYPERTENSION 03/02/2007  . Allergic rhinitis 03/02/2007  . Asthma  03/02/2007  . GERD 03/02/2007    Past Surgical History:  Procedure Laterality Date  . ABDOMINAL HYSTERECTOMY      OB History    No data available       Home Medications    Prior to Admission medications   Medication Sig Start Date End Date Taking? Authorizing Provider  albuterol (VENTOLIN HFA) 108 (90 Base) MCG/ACT inhaler INHALE 1-2 PUFFS INTO THE LUNGS EVERY 6 HOURS AS NEEDED FOR WHEEZING. 05/20/16   Arnoldo Morale, MD  atorvastatin (LIPITOR) 10 MG tablet TAKE 1 TABLET BY MOUTH DAILY AT 6 PM FOR CHOLESTEROL 05/20/16   Arnoldo Morale, MD  benzonatate (TESSALON) 100 MG capsule Take 2 capsules (200 mg total) by mouth 2 (two) times daily as needed for cough. 05/24/16   Nona Dell, PA-C  cetirizine (ZYRTEC) 10 MG tablet Take 1 tablet (10 mg total) by mouth daily. 05/20/16   Arnoldo Morale, MD  chlorpheniramine-HYDROcodone (TUSSIONEX PENNKINETIC ER) 10-8 MG/5ML SUER Take 5 mLs by mouth at bedtime as needed for cough. 05/20/16   Arnoldo Morale, MD  doxycycline (VIBRAMYCIN) 100 MG capsule Take 1 capsule (100 mg total) by mouth 2 (two) times daily. 05/24/16   Nona Dell, PA-C  fluticasone [facility]) 50 MCG/ACT nasal spray Place 1 spray into both nostrils 2 (two) times daily. 05/20/16   Arnoldo Morale, MD  Fluticasone-Salmeterol (ADVAIR) 500-50 MCG/DOSE AEPB Inhale 1 puff into the lungs every 12 (twelve) hours. 05/20/16   Arnoldo Morale, MD  hydrochlorothiazide (HYDRODIURIL) 25 MG tablet TAKE 1/2 TABLET BY MOUTH DAILY FOR BLOOD  PRESSURE 05/20/16   Arnoldo Morale, MD  ipratropium (ATROVENT) 0.06 % nasal spray Place 2 sprays into both nostrils 4 (four) times daily. 04/26/16   Billy Fischer, MD  irbesartan (AVAPRO) 150 MG tablet TAKE 1 TABLET BY MOUTH DAILY AFTER BREAKFAST FOR BLOOD PRESSURE 05/20/16   Arnoldo Morale, MD  montelukast (SINGULAIR) 10 MG tablet Take 1 tablet (10 mg total) by mouth at bedtime. 05/20/16   Arnoldo Morale, MD  oxymetazoline (AFRIN NASAL SPRAY) 0.05 % nasal spray Place 1  spray into both nostrils 2 (two) times daily. Apply spray to both nostrils twice daily for the next 3 days. Do not use longer than 3 days to prevent rebound rhinorrhea. 05/24/16   Nona Dell, PA-C  pantoprazole (PROTONIX) 20 MG tablet Take 1 tablet (20 mg total) by mouth daily. 05/20/16   Arnoldo Morale, MD  traZODone (DESYREL) 50 MG tablet Take 1 tablet (50 mg total) by mouth at bedtime. 05/20/16   Arnoldo Morale, MD    Family History Family History  Problem Relation Age of Onset  . Colon cancer Neg Hx     Social History Social History  Substance Use Topics  . Smoking status: Never Smoker  . Smokeless tobacco: Never Used  . Alcohol use No     Allergies   Review of patient's allergies indicates no known allergies.   Review of Systems Review of Systems  Constitutional: Negative for fever.  HENT: Positive for congestion and rhinorrhea. Negative for ear pain and sore throat.   Respiratory: Positive for cough and shortness of breath. Negative for wheezing.        Negative for hemoptysis.  Cardiovascular: Negative for chest pain.  Gastrointestinal: Negative for abdominal pain, nausea and vomiting.  All other systems reviewed and are negative.   Physical Exam Updated Vital Signs BP 123/71 (BP Location: Right Arm)   Pulse 98   Temp 97.6 F (36.4 C) (Oral)   Resp 18   SpO2 97%   Physical Exam  Constitutional: She is oriented to person, place, and time. She appears well-developed and well-nourished.  HENT:  Head: Normocephalic and atraumatic.  Right Ear: Tympanic membrane normal.  Left Ear: Tympanic membrane normal.  Nose: Nose normal. Right sinus exhibits no maxillary sinus tenderness and no frontal sinus tenderness. Left sinus exhibits no maxillary sinus tenderness and no frontal sinus tenderness.  Mouth/Throat: Uvula is midline, oropharynx is clear and moist and mucous membranes are normal. No oropharyngeal exudate, posterior oropharyngeal edema, posterior  oropharyngeal erythema or tonsillar abscesses.  Eyes: Conjunctivae and EOM are normal. Right eye exhibits no discharge. Left eye exhibits no discharge. No scleral icterus.  Neck: Normal range of motion. Neck supple.  Cardiovascular: Normal rate, regular rhythm, normal heart sounds and intact distal pulses.   Pulmonary/Chest: Effort normal. No respiratory distress. She has no wheezes. She has rales (mild rales noted in bibasillar lobes). She exhibits no tenderness.  Abdominal: Soft. Bowel sounds are normal. She exhibits no distension and no mass. There is no tenderness. There is no rebound and no guarding. No hernia.  Musculoskeletal: She exhibits no edema.  Lymphadenopathy:    She has no cervical adenopathy.  Neurological: She is alert and oriented to person, place, and time.  Skin: Skin is warm and dry.  Nursing note and vitals reviewed.   ED Treatments / Results  Labs (all labs ordered are listed, but only abnormal results are displayed) Labs Reviewed - No data to display  EKG  EKG Interpretation None  Radiology Dg Chest 2 View  Result Date: 05/24/2016 CLINICAL DATA:  Cough for 1 week. EXAM: CHEST  2 VIEW COMPARISON:  None. FINDINGS: Two-view chest shows no focal airspace consolidation. No pulmonary edema or pleural effusion. Interstitial markings are diffusely coarsened with chronic features. Chronic atelectasis or scarring at the left base is stable. Subtle reticular nodular pattern seen at the lung bases today is slightly more conspicuous than prior study and bronchopneumonia is not excluded. The cardiopericardial silhouette is within normal limits for size. The visualized bony structures of the thorax are intact. IMPRESSION: Question patchy basilar pneumonia bilaterally. No dense focal airspace consolidation. Electronically Signed   By: Misty Stanley M.D.   On: 05/24/2016 11:44    Procedures Procedures (including critical care time)  DIAGNOSTIC STUDIES: Oxygen  Saturation is 97% on RA, normal by my interpretation.    COORDINATION OF CARE: 11:56 AM Discussed treatment plan with pt at bedside and pt agreed to plan.  Medications Ordered in ED Medications - No data to display   Initial Impression / Assessment and Plan / ED Course  I have reviewed the triage vital signs and the nursing notes.  Pertinent labs & imaging results that were available during my care of the patient were reviewed by me and considered in my medical decision making (see chart for details).  Clinical Course   Patient has been diagnosed with CAP via chest xray. Pt is not ill appearing, immunocompromised, and does not have multiple co morbidities, therefore I feel like the they can be treated as an OP with abx therapy. Pt has been advised to return to the ED if symptoms worsen or they do not improve. Pt verbalizes understanding and is agreeable with plan.    I personally performed the services described in this documentation, which was scribed in my presence. The recorded information has been reviewed and is accurate.   Final Clinical Impressions(s) / ED Diagnoses   Final diagnoses:  CAP (community acquired pneumonia)    New Prescriptions New Prescriptions   BENZONATATE (TESSALON) 100 MG CAPSULE    Take 2 capsules (200 mg total) by mouth 2 (two) times daily as needed for cough.   DOXYCYCLINE (VIBRAMYCIN) 100 MG CAPSULE    Take 1 capsule (100 mg total) by mouth 2 (two) times daily.   OXYMETAZOLINE (AFRIN NASAL SPRAY) 0.05 % NASAL SPRAY    Place 1 spray into both nostrils 2 (two) times daily. Apply spray to both nostrils twice daily for the next 3 days. Do not use longer than 3 days to prevent rebound rhinorrhea.     Chesley Noon [place], [place] 05/24/16 1217    Ripley Fraise, MD 05/25/16 [phone]

## 2016-05-29 ENCOUNTER — Encounter (HOSPITAL_COMMUNITY): Payer: Self-pay

## 2016-05-29 ENCOUNTER — Emergency Department (HOSPITAL_COMMUNITY)
Admission: EM | Admit: 2016-05-29 | Discharge: 2016-05-29 | Disposition: A | Discharge status: Home / Self Care | Payer: Self-pay | Attending: Emergency Medicine | Admitting: Emergency Medicine

## 2016-05-29 DIAGNOSIS — J069 Acute upper respiratory infection, unspecified: Secondary | ICD-10-CM | POA: Insufficient documentation

## 2016-05-29 DIAGNOSIS — J209 Acute bronchitis, unspecified: Secondary | ICD-10-CM | POA: Insufficient documentation

## 2016-05-29 DIAGNOSIS — I10 Essential (primary) hypertension: Secondary | ICD-10-CM | POA: Insufficient documentation

## 2016-05-29 DIAGNOSIS — J309 Allergic rhinitis, unspecified: Secondary | ICD-10-CM | POA: Insufficient documentation

## 2016-05-29 MED ORDER — FLUTICASONE PROPIONATE 50 MCG/ACT NA SUSP: 2.0000 | Freq: Every day | NASAL | 0 refills | Status: DC

## 2016-05-29 MED ORDER — BENZONATATE 100 MG PO CAPS
200.0000 mg | ORAL_CAPSULE | Freq: Once | ORAL | Status: AC
  Administered 2016-05-29: 200 mg via ORAL
  Filled 2016-05-29: qty 2

## 2016-05-29 MED ORDER — GUAIFENESIN-CODEINE 100-10 MG/5ML PO SOLN
10.0000 mL | Freq: Once | ORAL | Status: AC
  Administered 2016-05-29: 10 mL via ORAL
  Filled 2016-05-29: qty 10

## 2016-05-29 MED ORDER — ALBUTEROL SULFATE HFA 108 (90 BASE) MCG/ACT IN AERS: 1.0000 | INHALATION_SPRAY | Freq: Four times a day (QID) | RESPIRATORY_TRACT | 0 refills | Status: DC | PRN

## 2016-05-29 MED ORDER — IPRATROPIUM-ALBUTEROL 0.5-2.5 (3) MG/3ML IN SOLN
3.0000 mL | Freq: Once | RESPIRATORY_TRACT | Status: AC
  Administered 2016-05-29: 3 mL via RESPIRATORY_TRACT
  Filled 2016-05-29: qty 6

## 2016-05-29 MED ORDER — PREDNISONE 20 MG PO TABS: 40.0000 mg | ORAL_TABLET | Freq: Every day | ORAL | 0 refills | Status: DC

## 2016-05-29 MED ORDER — LORATADINE 10 MG PO TABS: 10.0000 mg | ORAL_TABLET | Freq: Every day | ORAL | 0 refills | Status: DC

## 2016-05-29 MED ORDER — GUAIFENESIN-CODEINE 100-10 MG/5ML PO SOLN: 5.0000 mL | Freq: Three times a day (TID) | ORAL | 0 refills | Status: DC | PRN

## 2016-05-29 MED ORDER — ACETAMINOPHEN-CODEINE 120-12 MG/5ML PO SOLN: 10.0000 mL | Freq: Once | ORAL | Status: DC

## 2016-05-29 NOTE — ED Notes (Signed)
Pt complains of sinus problems with drainage and cough.  Not being able to sleep at night due to same.

## 2016-05-29 NOTE — ED Notes (Addendum)
Pt ambulated with no issue around the nurses station on [address]. Pt had no distress while walking

## 2016-05-29 NOTE — ED Triage Notes (Signed)
Patient complains of ongoing cough and congestion after being diagnosed with pneumonia 5 days ago. Taking antibiotics as prescribed but coughing all night, no distress

## 2016-05-29 NOTE — ED Provider Notes (Signed)
[facility] DEPT Provider Note   CSN: [id number] Arrival date & time: 05/29/16  [id number]     History   Chief Complaint Chief Complaint  Patient presents with  . Nasal Congestion  . Cough    HPI Joelisa Tobey is a 60 y.o. female.  HPI   Patient is a 60 year old female with history of asthma, hypertension, GERD, she presents ER with CC of pneumonia diagnosed 5 days ago, unimproved with doxycycline and Tessalon pearls, with continued profuse nasal congestion and drainage with persistent cough.  She has difficulty sleeping because of postnasal drainage and worsening cough. She has clear sputum production. She denies fever, sweats, chills.  She reports generalized malaise and fatigue with mild exertional dyspnea. She states normally she is able to ambulate long distances without any dyspnea however since being ill she can only walk roughly one block before becoming short of breath and having to stop to catch her breath. She denies a history of smoking but has some exposure to secondhand smoke with her significant other. She reports a history of asthma and bronchitis but does not have any breathing treatments available to her at home.  She denies orthopnea, lower extremity edema, palpitations, chest pain, near syncope, diaphoresis, nausea, vomiting, abdominal pain.  Past Medical History:  Diagnosis Date  . Asthma   . GERD (gastroesophageal reflux disease)   . High cholesterol   . Hypertension     Patient Active Problem List   Diagnosis Date Noted  . Primary localized osteoarthritis of knee 12/13/2015  . Benign essential HTN   . Physical deconditioning   . Anemia, iron deficiency   . Pressure ulcer 10/14/2015  . CAP (community acquired pneumonia)   . Pleural effusion   . Sepsis ([place])   . Sepsis due to Streptococcus pneumoniae ([place])   . Bacteremia   . Community acquired pneumonia 10/08/2015  . Acute sinusitis 12/02/2014  . Sinusitis 03/17/2013  . Hyperlipidemia 03/02/2007  .  HYPERTENSION 03/02/2007  . Allergic rhinitis 03/02/2007  . Asthma 03/02/2007  . GERD 03/02/2007    Past Surgical History:  Procedure Laterality Date  . ABDOMINAL HYSTERECTOMY      OB History    No data available       Home Medications    Prior to Admission medications   Medication Sig Start Date End Date Taking? Authorizing Provider  albuterol (PROVENTIL HFA;VENTOLIN HFA) 108 (90 Base) MCG/ACT inhaler Inhale 1-2 puffs into the lungs every 6 (six) hours as needed for wheezing or shortness of breath. 05/29/16   Delsa Grana, PA-C  albuterol (VENTOLIN HFA) 108 (90 Base) MCG/ACT inhaler INHALE 1-2 PUFFS INTO THE LUNGS EVERY 6 HOURS AS NEEDED FOR WHEEZING. 05/20/16   Arnoldo Morale, MD  atorvastatin (LIPITOR) 10 MG tablet TAKE 1 TABLET BY MOUTH DAILY AT 6 PM FOR CHOLESTEROL 05/20/16   Arnoldo Morale, MD  benzonatate (TESSALON) 100 MG capsule Take 2 capsules (200 mg total) by mouth 2 (two) times daily as needed for cough. 05/24/16   Nona Dell, PA-C  cetirizine (ZYRTEC) 10 MG tablet Take 1 tablet (10 mg total) by mouth daily. 05/20/16   Arnoldo Morale, MD  chlorpheniramine-HYDROcodone (TUSSIONEX PENNKINETIC ER) 10-8 MG/5ML SUER Take 5 mLs by mouth at bedtime as needed for cough. 05/20/16   Arnoldo Morale, MD  doxycycline (VIBRAMYCIN) 100 MG capsule Take 1 capsule (100 mg total) by mouth 2 (two) times daily. 05/24/16   Nona Dell, PA-C  fluticasone [facility]) 50 MCG/ACT nasal spray Place 1 spray into both  nostrils 2 (two) times daily. 05/20/16   Arnoldo Morale, MD  fluticasone (FLONASE) 50 MCG/ACT nasal spray Place 2 sprays into both nostrils daily. 05/29/16   Delsa Grana, PA-C  Fluticasone-Salmeterol (ADVAIR) 500-50 MCG/DOSE AEPB Inhale 1 puff into the lungs every 12 (twelve) hours. 05/20/16   Arnoldo Morale, MD  guaiFENesin-codeine 100-10 MG/5ML syrup Take 5 mLs by mouth 3 (three) times daily as needed for cough. 05/29/16   Delsa Grana, PA-C  hydrochlorothiazide (HYDRODIURIL) 25 MG tablet  TAKE 1/2 TABLET BY MOUTH DAILY FOR BLOOD PRESSURE 05/20/16   Arnoldo Morale, MD  ipratropium (ATROVENT) 0.06 % nasal spray Place 2 sprays into both nostrils 4 (four) times daily. 04/26/16   Billy Fischer, MD  irbesartan (AVAPRO) 150 MG tablet TAKE 1 TABLET BY MOUTH DAILY AFTER BREAKFAST FOR BLOOD PRESSURE 05/20/16   Arnoldo Morale, MD  loratadine (CLARITIN) 10 MG tablet Take 1 tablet (10 mg total) by mouth daily. 05/29/16   Delsa Grana, PA-C  montelukast (SINGULAIR) 10 MG tablet Take 1 tablet (10 mg total) by mouth at bedtime. 05/20/16   Arnoldo Morale, MD  oxymetazoline (AFRIN NASAL SPRAY) 0.05 % nasal spray Place 1 spray into both nostrils 2 (two) times daily. Apply spray to both nostrils twice daily for the next 3 days. Do not use longer than 3 days to prevent rebound rhinorrhea. 05/24/16   Nona Dell, PA-C  pantoprazole (PROTONIX) 20 MG tablet Take 1 tablet (20 mg total) by mouth daily. 05/20/16   Arnoldo Morale, MD  predniSONE (DELTASONE) 20 MG tablet Take 2 tablets (40 mg total) by mouth daily. 05/29/16   Delsa Grana, PA-C  traZODone (DESYREL) 50 MG tablet Take 1 tablet (50 mg total) by mouth at bedtime. 05/20/16   Arnoldo Morale, MD    Family History Family History  Problem Relation Age of Onset  . Colon cancer Neg Hx     Social History Social History  Substance Use Topics  . Smoking status: Never Smoker  . Smokeless tobacco: Never Used  . Alcohol use No     Allergies   Review of patient's allergies indicates no known allergies.   Review of Systems Review of Systems  All other systems reviewed and are negative.    Physical Exam Updated Vital Signs BP 136/87 (BP Location: Right Arm)   Pulse 90   Temp 98 F (36.7 C) (Oral)   Resp 16   Ht 5\' 7"  (1.702 m)   Wt 72.6 kg   SpO2 98%   BMI 25.06 kg/m   Physical Exam  Constitutional: She is oriented to person, place, and time. She appears well-developed and well-nourished. No distress.  HENT:  Head: Normocephalic and  atraumatic.  Right Ear: External ear normal.  Left Ear: External ear normal.  Nose: Mucosal edema present.  Mouth/Throat: Uvula is midline and oropharynx is clear and moist. No oropharyngeal exudate.  Pale, boggy and edematous nasal mucosa with profuse clear discharge  Eyes: Conjunctivae and EOM are normal. Pupils are equal, round, and reactive to light. Right eye exhibits no discharge. Left eye exhibits no discharge. No scleral icterus.  Neck: Normal range of motion. Neck supple. No JVD present. No tracheal deviation present.  Cardiovascular: Normal rate, regular rhythm, normal heart sounds and intact distal pulses.  Exam reveals no gallop and no friction rub.   No murmur heard. Symmetrical pulses radial 2+, dorsal pedis 2+, no lower extremity edema  Pulmonary/Chest: Effort normal. No stridor. No respiratory distress. She has wheezes. She has no rales.  She exhibits no tenderness.  Faint expiratory wheeze and upper airways, mildly diminished bilaterally at the bases, no rales or rhonchi, no retractions, no tachypnea, no respiratory distress, frequent cough  Abdominal: Soft. Bowel sounds are normal. She exhibits no distension and no mass. There is no tenderness. There is no guarding.  Musculoskeletal: Normal range of motion. She exhibits no edema.  Lymphadenopathy:    She has no cervical adenopathy.  Neurological: She is alert and oriented to person, place, and time. She exhibits normal muscle tone. Coordination normal.  Skin: Skin is warm and dry. Capillary refill takes less than 2 seconds. No rash noted. She is not diaphoretic. No erythema. No pallor.  No clubbing, no cyanosis, brisk capillary refill  Psychiatric: She has a normal mood and affect. Her behavior is normal. Judgment and thought content normal.  Nursing note and vitals reviewed.    ED Treatments / Results  Labs (all labs ordered are listed, but only abnormal results are displayed) Labs Reviewed - No data to display  EKG   EKG Interpretation None       Radiology No results found.  Procedures Procedures (including critical care time)  Medications Ordered in ED Medications  ipratropium-albuterol (DUONEB) 0.5-2.5 (3) MG/3ML nebulizer solution 3 mL (3 mLs Nebulization Given 05/29/16 1042)  benzonatate (TESSALON) capsule 200 mg (200 mg Oral Given 05/29/16 1041)  guaiFENesin-codeine 100-10 MG/5ML solution 10 mL (10 mLs Oral Given 05/29/16 1048)     Initial Impression / Assessment and Plan / ED Course  I have reviewed the triage vital signs and the nursing notes.  Pertinent labs & imaging results that were available during my care of the patient were reviewed by me and considered in my medical decision making (see chart for details).  Clinical Course   60 year old female complains of URI, profuse nasal drainage, continued cough, recently diagnosed and treated for immunity acquired pneumonia, 5 days ago. She reported minimal relief with Tessalon. Has been compliant with doxycycline without much improvement however she does state that her cough and shortness of breath are mostly from her postnasal drip and profuse nasal discharge. She denies chest pain, orthopnea, PND, palpitations, lower extremity edema. No history of CHF, she appears euvolemic. She has no respiratory distress.  No concern for ACS, or PE. She has had 2 chest x-rays of the last month, 5 days ago x-ray was significant for questionable patchy basilar pneumonia bilaterally without focal airspace consolidation. She does not have fever, do not feel she has felt outpatient and back therapy for But rather she has severe rhinitis which is causing continued cough. We will treat wheeze, cough and asthma with breathing treatments, steroids and cough medicine. Will treat nasal symptoms more aggressively with changes antihistamine, Flonase and encouraged to add montelukast, if change in antihistamine does not improve her nasal symptoms.  She was encouraged to  follow up with her PCP in 2-3 days. Return precautions reviewed, patient verbalizes understanding.  Final Clinical Impressions(s) / ED Diagnoses   Final diagnoses:  URI (upper respiratory infection)  Allergic rhinitis, unspecified allergic rhinitis type  Acute bronchitis, unspecified organism    New Prescriptions New Prescriptions   ALBUTEROL (PROVENTIL HFA;VENTOLIN HFA) 108 (90 BASE) MCG/ACT INHALER    Inhale 1-2 puffs into the lungs every 6 (six) hours as needed for wheezing or shortness of breath.   FLUTICASONE (FLONASE) 50 MCG/ACT NASAL SPRAY    Place 2 sprays into both nostrils daily.   GUAIFENESIN-CODEINE 100-10 MG/5ML SYRUP    Take 5 mLs  by mouth 3 (three) times daily as needed for cough.   LORATADINE (CLARITIN) 10 MG TABLET    Take 1 tablet (10 mg total) by mouth daily.   PREDNISONE (DELTASONE) 20 MG TABLET    Take 2 tablets (40 mg total) by mouth daily.     Delsa Grana, PA-C 06/01/16 2357    Gareth Morgan, MD 06/05/16 [phone]

## 2016-06-21 ENCOUNTER — Encounter: Payer: Self-pay | Admitting: Family Medicine

## 2016-06-21 ENCOUNTER — Ambulatory Visit: Payer: Self-pay | Attending: Family Medicine | Admitting: Family Medicine

## 2016-06-21 VITALS — BP 105/69 | HR 110 | Temp 98.1°F | Ht 67.0 in | Wt 166.2 lb

## 2016-06-21 DIAGNOSIS — J4540 Moderate persistent asthma, uncomplicated: Secondary | ICD-10-CM | POA: Insufficient documentation

## 2016-06-21 DIAGNOSIS — E785 Hyperlipidemia, unspecified: Secondary | ICD-10-CM | POA: Insufficient documentation

## 2016-06-21 DIAGNOSIS — G8929 Other chronic pain: Secondary | ICD-10-CM | POA: Insufficient documentation

## 2016-06-21 DIAGNOSIS — M25561 Pain in right knee: Secondary | ICD-10-CM | POA: Insufficient documentation

## 2016-06-21 DIAGNOSIS — Z79899 Other long term (current) drug therapy: Secondary | ICD-10-CM | POA: Insufficient documentation

## 2016-06-21 DIAGNOSIS — I10 Essential (primary) hypertension: Secondary | ICD-10-CM | POA: Insufficient documentation

## 2016-06-21 DIAGNOSIS — J322 Chronic ethmoidal sinusitis: Secondary | ICD-10-CM | POA: Insufficient documentation

## 2016-06-21 DIAGNOSIS — M25562 Pain in left knee: Secondary | ICD-10-CM | POA: Insufficient documentation

## 2016-06-21 DIAGNOSIS — K219 Gastro-esophageal reflux disease without esophagitis: Secondary | ICD-10-CM | POA: Insufficient documentation

## 2016-06-21 MED ORDER — MELOXICAM 7.5 MG PO TABS: 7.5000 mg | ORAL_TABLET | Freq: Every day | ORAL | 1 refills | Status: DC

## 2016-06-21 MED ORDER — PREDNISONE 10 MG PO TABS: 10.0000 mg | ORAL_TABLET | Freq: Every day | ORAL | 0 refills | Status: DC

## 2016-06-21 MED ORDER — ALBUTEROL SULFATE (2.5 MG/3ML) 0.083% IN NEBU: 2.5000 mg | INHALATION_SOLUTION | Freq: Four times a day (QID) | RESPIRATORY_TRACT | 1 refills | Status: DC | PRN

## 2016-06-21 MED ORDER — ALBUTEROL SULFATE HFA 108 (90 BASE) MCG/ACT IN AERS: 1.0000 | INHALATION_SPRAY | Freq: Four times a day (QID) | RESPIRATORY_TRACT | 2 refills | Status: DC | PRN

## 2016-06-21 NOTE — Patient Instructions (Signed)

## 2016-06-24 NOTE — Progress Notes (Signed)
Subjective:  Patient ID: Angel Hebert, female    DOB: 30-Dec-1955  Age: 60 y.o. MRN: [id number]  CC: Hypertension; URI; Cough; Nasal Congestion; Shortness of Breath; and Knee Pain (bilateral)   HPI Akeya Hebert  Is a 60 year old female with a history of hypertension, hyperlipidemia, asthma, allergic rhinitis, nasal polyps, GERD who presents to the clinic complaining of nasal congestion and sinus pressure. She denies fever, shortness of breath or wheezing.  She has had a couple of ED visits since her last visit with me a month ago. On 05/24/16 she was treated for community-acquired pneumonia and received a prescription for doxycycline, Tessalon Perles, Afrin nasal spray; on 9/27 treated for URI and received breathing treatments, steroids and Singulair was added to her regimen. Of note in 05/02/16 she received Augmentin and prednisone after completing a course of Z-Pak for acute sinusitis.  She has been unable to see ENT for recommended sinus surgery due to cost.  Past Medical History:  Diagnosis Date  . Asthma   . GERD (gastroesophageal reflux disease)   . High cholesterol   . Hypertension     Past Surgical History:  Procedure Laterality Date  . ABDOMINAL HYSTERECTOMY       Outpatient Medications Prior to Visit  Medication Sig Dispense Refill  . atorvastatin (LIPITOR) 10 MG tablet TAKE 1 TABLET BY MOUTH DAILY AT 6 PM FOR CHOLESTEROL 30 tablet 5  . chlorpheniramine-HYDROcodone (TUSSIONEX PENNKINETIC ER) 10-8 MG/5ML SUER Take 5 mLs by mouth at bedtime as needed for cough. 60 mL 0  . Fluticasone-Salmeterol (ADVAIR) 500-50 MCG/DOSE AEPB Inhale 1 puff into the lungs every 12 (twelve) hours. 60 each 5  . hydrochlorothiazide (HYDRODIURIL) 25 MG tablet TAKE 1/2 TABLET BY MOUTH DAILY FOR BLOOD PRESSURE 30 tablet 5  . ipratropium (ATROVENT) 0.06 % nasal spray Place 2 sprays into both nostrils 4 (four) times daily. 15 mL 1  . irbesartan (AVAPRO) 150 MG tablet TAKE 1 TABLET BY MOUTH DAILY AFTER  BREAKFAST FOR BLOOD PRESSURE 30 tablet 5  . montelukast (SINGULAIR) 10 MG tablet Take 1 tablet (10 mg total) by mouth at bedtime. 30 tablet 5  . pantoprazole (PROTONIX) 20 MG tablet Take 1 tablet (20 mg total) by mouth daily. 30 tablet 5  . traZODone (DESYREL) 50 MG tablet Take 1 tablet (50 mg total) by mouth at bedtime. 30 tablet 5  . albuterol (PROVENTIL HFA;VENTOLIN HFA) 108 (90 Base) MCG/ACT inhaler Inhale 1-2 puffs into the lungs every 6 (six) hours as needed for wheezing or shortness of breath. 1 Inhaler 0  . benzonatate (TESSALON) 100 MG capsule Take 2 capsules (200 mg total) by mouth 2 (two) times daily as needed for cough. (Patient not taking: Reported on 06/21/2016) 20 capsule 0  . cetirizine (ZYRTEC) 10 MG tablet Take 1 tablet (10 mg total) by mouth daily. 30 tablet 5  . fluticasone (FLONASE) 50 MCG/ACT nasal spray Place 1 spray into both nostrils 2 (two) times daily. (Patient not taking: Reported on 06/21/2016) 1 g 5  . oxymetazoline (AFRIN NASAL SPRAY) 0.05 % nasal spray Place 1 spray into both nostrils 2 (two) times daily. Apply spray to both nostrils twice daily for the next 3 days. Do not use longer than 3 days to prevent rebound rhinorrhea. (Patient not taking: Reported on 06/21/2016) 30 mL 0  . albuterol (VENTOLIN HFA) 108 (90 Base) MCG/ACT inhaler INHALE 1-2 PUFFS INTO THE LUNGS EVERY 6 HOURS AS NEEDED FOR WHEEZING. 18 g 5  . doxycycline (VIBRAMYCIN) 100 MG capsule Take  1 capsule (100 mg total) by mouth 2 (two) times daily. 20 capsule 0  . fluticasone (FLONASE) 50 MCG/ACT nasal spray Place 2 sprays into both nostrils daily. 16 g 0  . guaiFENesin-codeine 100-10 MG/5ML syrup Take 5 mLs by mouth 3 (three) times daily as needed for cough. (Patient not taking: Reported on 06/21/2016) 120 mL 0  . loratadine (CLARITIN) 10 MG tablet Take 1 tablet (10 mg total) by mouth daily. (Patient not taking: Reported on 06/21/2016) 30 tablet 0  . predniSONE (DELTASONE) 20 MG tablet Take 2 tablets (40 mg  total) by mouth daily. (Patient not taking: Reported on 06/21/2016) 10 tablet 0   No facility-administered medications prior to visit.     ROS Review of Systems  Constitutional: Negative for activity change, appetite change and fatigue.  HENT:       See history of present illness  Eyes: Negative for visual disturbance.  Respiratory: Negative for cough, chest tightness, shortness of breath and wheezing.   Cardiovascular: Negative for chest pain and palpitations.  Gastrointestinal: Negative for abdominal distention, abdominal pain and constipation.  Endocrine: Negative for polydipsia.  Genitourinary: Negative for dysuria and frequency.  Musculoskeletal: Negative for arthralgias and back pain.  Skin: Negative for rash.  Neurological: Negative for tremors, light-headedness and numbness.  Hematological: Does not bruise/bleed easily.  Psychiatric/Behavioral: Negative for agitation and behavioral problems.    Objective:  BP 105/69 (BP Location: Right Arm, Patient Position: Sitting, Cuff Size: Large)   Pulse (!) 110   Temp 98.1 F (36.7 C) (Oral)   Ht 5\' 7"  (1.702 m)   Wt 166 lb 3.2 oz (75.4 kg)   SpO2 98%   BMI 26.03 kg/m   BP/Weight 06/21/2016 05/29/2016 [id number]  Systolic BP 123456 [id number]  Diastolic BP 69 78 71  Wt. (Lbs) 166.2 160 -  BMI 26.03 25.06 -      Physical Exam  Constitutional: She is oriented to person, place, and time. She appears well-developed and well-nourished.  HENT:  Nasal speech Right maxillary sinus tenderness  Cardiovascular: Normal heart sounds and intact distal pulses.  Tachycardia present.   No murmur heard. Pulmonary/Chest: Effort normal and breath sounds normal. She has no wheezes. She has no rales. She exhibits no tenderness.  Abdominal: Soft. Bowel sounds are normal. She exhibits no distension and no mass. There is no tenderness.  Musculoskeletal: Normal range of motion.  Neurological: She is alert and oriented to person, place, and time.      Assessment & Plan:   1. Chronic ethmoidal sinusitis Recently completed a course of doxycycline which was prescribed on 05/24/16 I do not feel antibiotic is indicated at this time. Patient needs to have a sinus surgery which is unable to afford. - predniSONE (DELTASONE) 10 MG tablet; Take 1 tablet (10 mg total) by mouth daily.  Dispense: 5 tablet; Refill: 0  2. Chronic pain of both knees Likely underlying osteoarthritis - meloxicam (MOBIC) 7.5 MG tablet; Take 1 tablet (7.5 mg total) by mouth daily.  Dispense: 30 tablet; Refill: 1  3. Moderate persistent asthma without complication Continue nebulizers as needed as well as Advair, proventil.   Meds ordered this encounter  Medications  . meloxicam (MOBIC) 7.5 MG tablet    Sig: Take 1 tablet (7.5 mg total) by mouth daily.    Dispense:  30 tablet    Refill:  1  . predniSONE (DELTASONE) 10 MG tablet    Sig: Take 1 tablet (10 mg total) by mouth daily.  Dispense:  5 tablet    Refill:  0  . albuterol (PROVENTIL HFA;VENTOLIN HFA) 108 (90 Base) MCG/ACT inhaler    Sig: Inhale 1-2 puffs into the lungs every 6 (six) hours as needed for wheezing or shortness of breath.    Dispense:  1 Inhaler    Refill:  2  . albuterol (PROVENTIL) (2.5 MG/3ML) 0.083% nebulizer solution    Sig: Take 3 mLs (2.5 mg total) by nebulization every 6 (six) hours as needed for wheezing or shortness of breath.    Dispense:  150 mL    Refill:  1    Follow-up: Return in about 6 weeks (around 08/02/2016) for Follow-up on chronic medical conditions.   Arnoldo Morale MD

## 2016-07-10 ENCOUNTER — Ambulatory Visit: Payer: Self-pay

## 2016-07-10 ENCOUNTER — Ambulatory Visit: Payer: Self-pay | Attending: Family Medicine

## 2016-07-18 ENCOUNTER — Ambulatory Visit (HOSPITAL_COMMUNITY)
Admission: EM | Admit: 2016-07-18 | Discharge: 2016-07-18 | Disposition: A | Discharge status: Home / Self Care | Payer: Self-pay | Attending: Emergency Medicine | Admitting: Emergency Medicine

## 2016-07-18 ENCOUNTER — Encounter (HOSPITAL_COMMUNITY): Payer: Self-pay | Admitting: Emergency Medicine

## 2016-07-18 DIAGNOSIS — J339 Nasal polyp, unspecified: Secondary | ICD-10-CM

## 2016-07-18 DIAGNOSIS — J320 Chronic maxillary sinusitis: Secondary | ICD-10-CM

## 2016-07-18 MED ORDER — PREDNISONE 10 MG (21) PO TBPK: ORAL_TABLET | ORAL | 0 refills | Status: DC

## 2016-07-18 MED ORDER — AMOXICILLIN-POT CLAVULANATE 875-125 MG PO TABS: 1.0000 | ORAL_TABLET | Freq: Two times a day (BID) | ORAL | 0 refills | Status: AC

## 2016-07-18 NOTE — ED Provider Notes (Signed)
CSN: [id number]     Arrival date & time 07/18/16  1021 History   First MD Initiated Contact with Patient 07/18/16 1157     Chief Complaint  Patient presents with  . Sinusitis   (Consider location/radiation/quality/duration/timing/severity/associated sxs/prior Treatment) 60 year old female presents with nasal congestion, sinus pain and pressure, and dry cough for over 2 weeks. Denies any fever or GI symptoms. Has history of recurrent sinus infections due to numerous nasal polyps. Last seen here in August 2017 and was prescribed Augmentin and Prednisone which worked well. She has also used her Albuterol inhaler with good success. She is unable to use her Flonase due to severe congestion and nasal swelling. She had seen an ENT a few years ago but was unable to afford surgery at that time. Wants to be referred again to ENT for further evaluation.      Past Medical History:  Diagnosis Date  . Asthma   . GERD (gastroesophageal reflux disease)   . High cholesterol   . Hypertension    Past Surgical History:  Procedure Laterality Date  . ABDOMINAL HYSTERECTOMY     Family History  Problem Relation Age of Onset  . Colon cancer Neg Hx    Social History  Substance Use Topics  . Smoking status: Never Smoker  . Smokeless tobacco: Never Used  . Alcohol use No   OB History    No data available     Review of Systems  Constitutional: Positive for fatigue. Negative for appetite change, chills and fever.  HENT: Positive for congestion, rhinorrhea, sinus pain and sinus pressure. Negative for ear discharge, ear pain, facial swelling, sneezing and sore throat.   Eyes: Negative for discharge.  Respiratory: Positive for cough. Negative for chest tightness, shortness of breath and wheezing.   Cardiovascular: Negative for chest pain.  Gastrointestinal: Negative for abdominal pain, diarrhea, nausea and vomiting.  Musculoskeletal: Negative for neck pain and neck stiffness.  Skin: Negative for rash  and wound.  Neurological: Positive for headaches. Negative for dizziness, weakness, light-headedness and numbness.  Hematological: Negative for adenopathy.    Allergies  Patient has no known allergies.  Home Medications   Prior to Admission medications   Medication Sig Start Date End Date Taking? Authorizing Provider  albuterol (PROVENTIL HFA;VENTOLIN HFA) 108 (90 Base) MCG/ACT inhaler Inhale 1-2 puffs into the lungs every 6 (six) hours as needed for wheezing or shortness of breath. 06/21/16  Yes Arnoldo Morale, MD  albuterol (PROVENTIL) (2.5 MG/3ML) 0.083% nebulizer solution Take 3 mLs (2.5 mg total) by nebulization every 6 (six) hours as needed for wheezing or shortness of breath. 06/21/16  Yes Arnoldo Morale, MD  atorvastatin (LIPITOR) 10 MG tablet TAKE 1 TABLET BY MOUTH DAILY AT 6 PM FOR CHOLESTEROL 05/20/16  Yes Arnoldo Morale, MD  cetirizine (ZYRTEC) 10 MG tablet Take 1 tablet (10 mg total) by mouth daily. 05/20/16  Yes Arnoldo Morale, MD  fluticasone (FLONASE) 50 MCG/ACT nasal spray Place 1 spray into both nostrils 2 (two) times daily. 05/20/16  Yes Arnoldo Morale, MD  Fluticasone-Salmeterol (ADVAIR) 500-50 MCG/DOSE AEPB Inhale 1 puff into the lungs every 12 (twelve) hours. 05/20/16  Yes Arnoldo Morale, MD  hydrochlorothiazide (HYDRODIURIL) 25 MG tablet TAKE 1/2 TABLET BY MOUTH DAILY FOR BLOOD PRESSURE 05/20/16  Yes Arnoldo Morale, MD  ipratropium (ATROVENT) 0.06 % nasal spray Place 2 sprays into both nostrils 4 (four) times daily. 04/26/16  Yes Billy Fischer, MD  irbesartan (AVAPRO) 150 MG tablet TAKE 1 TABLET BY MOUTH  DAILY AFTER BREAKFAST FOR BLOOD PRESSURE 05/20/16  Yes Arnoldo Morale, MD  meloxicam (MOBIC) 7.5 MG tablet Take 1 tablet (7.5 mg total) by mouth daily. 06/21/16  Yes Arnoldo Morale, MD  montelukast (SINGULAIR) 10 MG tablet Take 1 tablet (10 mg total) by mouth at bedtime. 05/20/16  Yes Arnoldo Morale, MD  pantoprazole (PROTONIX) 20 MG tablet Take 1 tablet (20 mg total) by mouth daily. 05/20/16  Yes  Arnoldo Morale, MD  traZODone (DESYREL) 50 MG tablet Take 1 tablet (50 mg total) by mouth at bedtime. 05/20/16  Yes Arnoldo Morale, MD  amoxicillin-clavulanate (AUGMENTIN) 875-125 MG tablet Take 1 tablet by mouth 2 (two) times daily. 07/18/16 07/28/16  Katy Apo, NP  predniSONE (STERAPRED UNI-PAK 21 TAB) 10 MG (21) TBPK tablet Take 6 tabs by mouth on 1st day then 5 tabs on 2nd day, then 4 tabs for 3rd day, then 3 tabs for 4th day then 2 tabs for 5th day, then 1 tab by mouth on day 6. 07/18/16   Katy Apo, NP   Meds Ordered and Administered this Visit  Medications - No data to display  BP 130/76 (BP Location: Left Arm)   Pulse 111   Temp 97.7 F (36.5 C) (Oral)   Resp 20   SpO2 99%  No data found.   Physical Exam  Constitutional: She is oriented to person, place, and time. She appears well-developed and well-nourished. She appears ill. No distress.  HENT:  Head: Normocephalic and atraumatic.  Right Ear: Hearing, tympanic membrane, external ear and ear canal normal.  Left Ear: Hearing, tympanic membrane, external ear and ear canal normal.  Nose: Mucosal edema and nasal deformity (nasal polyps present in both upper nares) present. Right sinus exhibits maxillary sinus tenderness. Right sinus exhibits no frontal sinus tenderness. Left sinus exhibits maxillary sinus tenderness. Left sinus exhibits no frontal sinus tenderness.  Mouth/Throat: Uvula is midline, oropharynx is clear and moist and mucous membranes are normal. Oropharyngeal exudate: yellow post nasal drainage present.  Neck: Normal range of motion. Neck supple.  Cardiovascular: Normal rate, regular rhythm and normal heart sounds.   Pulmonary/Chest: Effort normal and breath sounds normal. She has no wheezes. She has no rhonchi. She has no rales.  Lymphadenopathy:    She has no cervical adenopathy.  Neurological: She is alert and oriented to person, place, and time.  Skin: Skin is warm and dry. Capillary refill takes less than  2 seconds.  Psychiatric: She has a normal mood and affect. Her behavior is normal. Judgment and thought content normal.    Urgent Care Course   Clinical Course     Procedures (including critical care time)  Labs Review Labs Reviewed - No data to display  Imaging Review No results found.   Visual Acuity Review  Right Eye Distance:   Left Eye Distance:   Bilateral Distance:    Right Eye Near:   Left Eye Near:    Bilateral Near:         MDM   1. Sinusitis, maxillary, chronic   2. Multiple nasal polyps    Discussed that we will treat her current infection but she needs to see an ENT. Recommend start Augmentin 875mg  twice a day as directed. (Did not prescribe 1000mg XR since unable to afford- no insurance).  Recommend Prednisone 10mg  6 day dose pack as directed. Continue Albuterol and Flonase as directed. Call ENT office as listedon her paperwork for appointment ASAP for further evaluation.      Nicholes Stairs  Fey Coghill, NP 07/19/16 1010

## 2016-07-18 NOTE — ED Triage Notes (Signed)
Pt has been suffering from nasal and sinus congestion and a cough for two weeks.  Pt reports green drainage from the nose.  She denies any fever.  She has not tried any OTC remedies.

## 2016-07-18 NOTE — Discharge Instructions (Signed)
Start Augmentin twice a day as directed. Recommend Prednisone 10mg  6 day dose pack as directed. Continue Albuterol and Flonase as directed. Call ENT office as listed for appointment ASAP for further evaluation.

## 2016-07-29 ENCOUNTER — Emergency Department (HOSPITAL_COMMUNITY)
Admission: EM | Admit: 2016-07-29 | Discharge: 2016-07-29 | Disposition: A | Discharge status: Home / Self Care | Payer: Self-pay | Attending: Emergency Medicine | Admitting: Emergency Medicine

## 2016-07-29 ENCOUNTER — Encounter (HOSPITAL_COMMUNITY): Payer: Self-pay | Admitting: *Deleted

## 2016-07-29 DIAGNOSIS — G8929 Other chronic pain: Secondary | ICD-10-CM

## 2016-07-29 DIAGNOSIS — M25561 Pain in right knee: Secondary | ICD-10-CM | POA: Insufficient documentation

## 2016-07-29 DIAGNOSIS — M25562 Pain in left knee: Secondary | ICD-10-CM | POA: Insufficient documentation

## 2016-07-29 DIAGNOSIS — J329 Chronic sinusitis, unspecified: Secondary | ICD-10-CM | POA: Insufficient documentation

## 2016-07-29 DIAGNOSIS — I10 Essential (primary) hypertension: Secondary | ICD-10-CM | POA: Insufficient documentation

## 2016-07-29 DIAGNOSIS — J45909 Unspecified asthma, uncomplicated: Secondary | ICD-10-CM | POA: Insufficient documentation

## 2016-07-29 MED ORDER — DEXAMETHASONE SODIUM PHOSPHATE 10 MG/ML IJ SOLN
10.0000 mg | Freq: Once | INTRAMUSCULAR | Status: AC
  Administered 2016-07-29: 10 mg via INTRAMUSCULAR
  Filled 2016-07-29: qty 1

## 2016-07-29 MED ORDER — NAPROXEN 500 MG PO TABS: 500.0000 mg | ORAL_TABLET | Freq: Two times a day (BID) | ORAL | 0 refills | Status: DC

## 2016-07-29 NOTE — ED Provider Notes (Signed)
[name] DEPT Provider Note   CSN: [id number] Arrival date & time: 07/29/16  [id number]  By signing my name below, I, Rayna Sexton, attest that this documentation has been prepared under the direction and in the presence of Gloriann Loan, PA-C. Electronically Signed: Rayna Sexton, ED Scribe. 07/29/16. 12:37 PM.   History   Chief Complaint Chief Complaint  Patient presents with  . Knee Pain  . URI    HPI HPI Comments: Angel Hebert is a 60 y.o. female who presents to the Emergency Department complaining of chronic, moderate, worsening bilateral knee pain x 1 week. She denies any recent falls or injuries. Pt reports associated, mild, bilateral knee swelling. She states her symptoms are chronic and typically has an arthrocentesis performed for relief. She denies fevers, chills, increased warmth, redness or other associated symptoms at this time.    Pt was seen on 07/19/2016 and dx with a URI/sinusitis. She was d/c with Augmentin 875 mg twice daily as well as six day course of prednisone and instructed to continue use of albuterol and Flonase. Pt states she was compliant with her abx and denies relief of her sinus congestion. Pt states she is unable to use Flonase due to her sinus congestion but has been taking zyrtec. She denies eye pain, visual changes, HA, sore throat or other associated symptoms at this time.   The history is provided by the patient. No language interpreter was used.    Past Medical History:  Diagnosis Date  . Asthma   . GERD (gastroesophageal reflux disease)   . High cholesterol   . Hypertension     Patient Active Problem List   Diagnosis Date Noted  . Primary localized osteoarthritis of knee 12/13/2015  . Benign essential HTN   . Physical deconditioning   . Anemia, iron deficiency   . Pressure ulcer 10/14/2015  . CAP (community acquired pneumonia)   . Pleural effusion   . Sepsis ([place])   . Sepsis due to Streptococcus pneumoniae ([place])   . Bacteremia   .  Community acquired pneumonia 10/08/2015  . Acute sinusitis 12/02/2014  . Sinusitis 03/17/2013  . Hyperlipidemia 03/02/2007  . HYPERTENSION 03/02/2007  . Allergic rhinitis 03/02/2007  . Asthma 03/02/2007  . GERD 03/02/2007    Past Surgical History:  Procedure Laterality Date  . ABDOMINAL HYSTERECTOMY      OB History    No data available       Home Medications    Prior to Admission medications   Medication Sig Start Date End Date Taking? Authorizing Provider  albuterol (PROVENTIL HFA;VENTOLIN HFA) 108 (90 Base) MCG/ACT inhaler Inhale 1-2 puffs into the lungs every 6 (six) hours as needed for wheezing or shortness of breath. 06/21/16   Arnoldo Morale, MD  albuterol (PROVENTIL) (2.5 MG/3ML) 0.083% nebulizer solution Take 3 mLs (2.5 mg total) by nebulization every 6 (six) hours as needed for wheezing or shortness of breath. 06/21/16   Arnoldo Morale, MD  atorvastatin (LIPITOR) 10 MG tablet TAKE 1 TABLET BY MOUTH DAILY AT 6 PM FOR CHOLESTEROL 05/20/16   Arnoldo Morale, MD  cetirizine (ZYRTEC) 10 MG tablet Take 1 tablet (10 mg total) by mouth daily. 05/20/16   Arnoldo Morale, MD  fluticasone (FLONASE) 50 MCG/ACT nasal spray Place 1 spray into both nostrils 2 (two) times daily. 05/20/16   Arnoldo Morale, MD  Fluticasone-Salmeterol (ADVAIR) 500-50 MCG/DOSE AEPB Inhale 1 puff into the lungs every 12 (twelve) hours. 05/20/16   Arnoldo Morale, MD  hydrochlorothiazide (HYDRODIURIL) 25 MG tablet TAKE  1/2 TABLET BY MOUTH DAILY FOR BLOOD PRESSURE 05/20/16   Arnoldo Morale, MD  ipratropium (ATROVENT) 0.06 % nasal spray Place 2 sprays into both nostrils 4 (four) times daily. 04/26/16   Billy Fischer, MD  irbesartan (AVAPRO) 150 MG tablet TAKE 1 TABLET BY MOUTH DAILY AFTER BREAKFAST FOR BLOOD PRESSURE 05/20/16   Arnoldo Morale, MD  meloxicam (MOBIC) 7.5 MG tablet Take 1 tablet (7.5 mg total) by mouth daily. 06/21/16   Arnoldo Morale, MD  montelukast (SINGULAIR) 10 MG tablet Take 1 tablet (10 mg total) by mouth at bedtime.  05/20/16   Arnoldo Morale, MD  naproxen (NAPROSYN) 500 MG tablet Take 1 tablet (500 mg total) by mouth 2 (two) times daily. 07/29/16   Gloriann Loan, PA-C  pantoprazole (PROTONIX) 20 MG tablet Take 1 tablet (20 mg total) by mouth daily. 05/20/16   Arnoldo Morale, MD  predniSONE (STERAPRED UNI-PAK 21 TAB) 10 MG (21) TBPK tablet Take 6 tabs by mouth on 1st day then 5 tabs on 2nd day, then 4 tabs for 3rd day, then 3 tabs for 4th day then 2 tabs for 5th day, then 1 tab by mouth on day 6. 07/18/16   Katy Apo, NP  traZODone (DESYREL) 50 MG tablet Take 1 tablet (50 mg total) by mouth at bedtime. 05/20/16   Arnoldo Morale, MD    Family History Family History  Problem Relation Age of Onset  . Colon cancer Neg Hx     Social History Social History  Substance Use Topics  . Smoking status: Never Smoker  . Smokeless tobacco: Never Used  . Alcohol use No     Allergies   Patient has no known allergies.   Review of Systems Review of Systems  HENT: Positive for congestion. Negative for sore throat.   Eyes: Negative for pain and visual disturbance.  Musculoskeletal: Positive for arthralgias and joint swelling.  Skin: Negative for color change and wound.  Neurological: Negative for numbness.  All other systems reviewed and are negative.  Physical Exam Updated Vital Signs BP 119/69 (BP Location: Left Arm)   Pulse 97   Temp 98.2 F (36.8 C) (Oral)   Resp 18   Ht 5\' 7"  (1.702 m)   Wt 72.6 kg   SpO2 100%   BMI 25.06 kg/m   Physical Exam  Constitutional: She is oriented to person, place, and time. She appears well-developed and well-nourished.  HENT:  Head: Normocephalic and atraumatic.  Right Ear: External ear normal.  Left Ear: External ear normal.  Nose: Mucosal edema and rhinorrhea present. Right sinus exhibits no maxillary sinus tenderness and no frontal sinus tenderness. Left sinus exhibits no maxillary sinus tenderness and no frontal sinus tenderness.  Eyes: Conjunctivae are normal.  No scleral icterus.  No pain with EOM  Neck: No tracheal deviation present.  Pulmonary/Chest: Effort normal. No respiratory distress.  Abdominal: She exhibits no distension.  Musculoskeletal: Normal range of motion. She exhibits tenderness.  Generalized tenderness over bilateral knees with minimal swelling without erythema, warmth, or pain with PROM/AROM.  Neurological: She is alert and oriented to person, place, and time.  Normal strength and sensation throughout bilateral lower extremities.   Skin: Skin is warm and dry.  Psychiatric: She has a normal mood and affect. Her behavior is normal.   ED Treatments / Results  Labs (all labs ordered are listed, but only abnormal results are displayed) Labs Reviewed - No data to display  EKG  EKG Interpretation None  Radiology No results found.  Procedures Procedures  DIAGNOSTIC STUDIES: Oxygen Saturation is 100% on RA, normal by my interpretation.    COORDINATION OF CARE: 12:35 PM Discussed next steps with pt. Pt verbalized understanding and is agreeable with the plan.    Medications Ordered in ED Medications  dexamethasone (DECADRON) injection 10 mg (10 mg Intramuscular Given 07/29/16 1253)     Initial Impression / Assessment and Plan / ED Course  I have reviewed the triage vital signs and the nursing notes.  Pertinent labs & imaging results that were available during my care of the patient were reviewed by me and considered in my medical decision making (see chart for details).  Clinical Course    Patient complaining of symptoms of sinusitis and chronic bilateral knee pain.  She has finished Augmentin and prednisone with continued nasal discharge.  No frontal/maxillary sinus tenderness on exam, no signs of orbital cellulitis, nasal discharge and mucosal edema.  Given Decadron in ED.  Discussed saline nasal washes, flonase, and zyrtec.  Given follow up with ENT.  Bilateral knee pain is chronic, no trauma to warrant  imaging.  No signs of septic arthritis.  No joint effusions to warrant arthrocentesis.  Home with Naproxen.  Follow up with orthopedics.  Return precautions discussed.  Stable for discharge.   Final Clinical Impressions(s) / ED Diagnoses   Final diagnoses:  Sinusitis, unspecified chronicity, unspecified location  Chronic pain of both knees    New Prescriptions New Prescriptions   NAPROXEN (NAPROSYN) 500 MG TABLET    Take 1 tablet (500 mg total) by mouth 2 (two) times daily.   I personally performed the services described in this documentation, which was scribed in my presence. The recorded information has been reviewed and is accurate.        Gloriann Loan, PA-C 07/29/16 [place], MD 08/06/16 [phone]

## 2016-07-29 NOTE — ED Triage Notes (Signed)
Pt states chronic bil knee pain and sometimes has fluid drained off of them. Here today to see if she can have them x-rayed, as well as her chronic pain looked at.  Also states the sinus congestion that she was seen here for 1 week ago has not improved.

## 2016-07-29 NOTE — Discharge Instructions (Signed)
Take Naproxen twice daily for pain.  Continue using Flonase for nasal congestion.  Also, continue taking Zyrtec.  It is important to follow up with an ENT for continued management of your nasal congestion.  Follow up with orthopedics for persistent knee pain.  Return to the ED for worsening pain, visual changes, severe headache, pain with eye movement, or any new or concerning symptoms.

## 2016-07-29 NOTE — ED Notes (Signed)
Declined W/C at D/C and was escorted to lobby by RN. 

## 2016-08-01 ENCOUNTER — Encounter: Payer: Self-pay | Admitting: Family Medicine

## 2016-08-01 ENCOUNTER — Ambulatory Visit: Payer: Self-pay | Attending: Family Medicine | Admitting: Family Medicine

## 2016-08-01 VITALS — BP 107/62 | HR 98 | Temp 97.8°F | Ht 67.0 in | Wt 171.6 lb

## 2016-08-01 DIAGNOSIS — M7989 Other specified soft tissue disorders: Secondary | ICD-10-CM | POA: Insufficient documentation

## 2016-08-01 DIAGNOSIS — M79604 Pain in right leg: Secondary | ICD-10-CM | POA: Insufficient documentation

## 2016-08-01 DIAGNOSIS — M170 Bilateral primary osteoarthritis of knee: Secondary | ICD-10-CM | POA: Insufficient documentation

## 2016-08-01 DIAGNOSIS — J322 Chronic ethmoidal sinusitis: Secondary | ICD-10-CM | POA: Insufficient documentation

## 2016-08-01 DIAGNOSIS — M79605 Pain in left leg: Secondary | ICD-10-CM | POA: Insufficient documentation

## 2016-08-01 DIAGNOSIS — M1710 Unilateral primary osteoarthritis, unspecified knee: Secondary | ICD-10-CM

## 2016-08-01 DIAGNOSIS — J45909 Unspecified asthma, uncomplicated: Secondary | ICD-10-CM | POA: Insufficient documentation

## 2016-08-01 DIAGNOSIS — E7800 Pure hypercholesterolemia, unspecified: Secondary | ICD-10-CM | POA: Insufficient documentation

## 2016-08-01 DIAGNOSIS — I10 Essential (primary) hypertension: Secondary | ICD-10-CM | POA: Insufficient documentation

## 2016-08-01 DIAGNOSIS — R0981 Nasal congestion: Secondary | ICD-10-CM | POA: Insufficient documentation

## 2016-08-01 DIAGNOSIS — K219 Gastro-esophageal reflux disease without esophagitis: Secondary | ICD-10-CM | POA: Insufficient documentation

## 2016-08-01 MED ORDER — METHYLPREDNISOLONE SODIUM SUCC 125 MG IJ SOLR
62.5000 mg | Freq: Once | INTRAMUSCULAR | Status: AC
  Administered 2016-08-01: 62.5 mg via INTRAMUSCULAR

## 2016-08-01 NOTE — Progress Notes (Signed)
Subjective:  Patient ID: Angel Hebert, female    DOB: 26-Nov-1955  Age: 60 y.o. MRN: [id number]  CC: Nasal Congestion; Hypertension; and Leg Pain (L>R  swelling)   HPI Angel Hebert  Is a 60 year old female with a history of hypertension, hyperlipidemia, asthma, allergic rhinitis, nasal polyps, GERD who presents to the clinic Requesting referral to ENT and orthopedics.  She was seen at her last ED visit for bilateral knee pain and was informed she would need to see orthopedics. X-rays from 12/2015 revealed tricompartmental osteoarthritis most severe in the medial femorotibial compartment, left greater than right, moderate left joint effusion, small right joint effusion. She has had repeated arthrocentesis in the past according to patient. Pain is not responding to NSAIDs and is worse with walking.  She has also had several ED visits for chronic sinusitis and has received antibiotics at least monthly in the last 6 months. She was previously by ENT that she would need surgery however she never followed through due to the cost.  Past Medical History:  Diagnosis Date  . Asthma   . GERD (gastroesophageal reflux disease)   . High cholesterol   . Hypertension     Past Surgical History:  Procedure Laterality Date  . ABDOMINAL HYSTERECTOMY      No Known Allergies   Outpatient Medications Prior to Visit  Medication Sig Dispense Refill  . albuterol (PROVENTIL HFA;VENTOLIN HFA) 108 (90 Base) MCG/ACT inhaler Inhale 1-2 puffs into the lungs every 6 (six) hours as needed for wheezing or shortness of breath. 1 Inhaler 2  . albuterol (PROVENTIL) (2.5 MG/3ML) 0.083% nebulizer solution Take 3 mLs (2.5 mg total) by nebulization every 6 (six) hours as needed for wheezing or shortness of breath. 150 mL 1  . atorvastatin (LIPITOR) 10 MG tablet TAKE 1 TABLET BY MOUTH DAILY AT 6 PM FOR CHOLESTEROL 30 tablet 5  . cetirizine (ZYRTEC) 10 MG tablet Take 1 tablet (10 mg total) by mouth daily. 30 tablet 5  .  Fluticasone-Salmeterol (ADVAIR) 500-50 MCG/DOSE AEPB Inhale 1 puff into the lungs every 12 (twelve) hours. 60 each 5  . hydrochlorothiazide (HYDRODIURIL) 25 MG tablet TAKE 1/2 TABLET BY MOUTH DAILY FOR BLOOD PRESSURE 30 tablet 5  . irbesartan (AVAPRO) 150 MG tablet TAKE 1 TABLET BY MOUTH DAILY AFTER BREAKFAST FOR BLOOD PRESSURE 30 tablet 5  . montelukast (SINGULAIR) 10 MG tablet Take 1 tablet (10 mg total) by mouth at bedtime. 30 tablet 5  . pantoprazole (PROTONIX) 20 MG tablet Take 1 tablet (20 mg total) by mouth daily. 30 tablet 5  . traZODone (DESYREL) 50 MG tablet Take 1 tablet (50 mg total) by mouth at bedtime. 30 tablet 5  . fluticasone (FLONASE) 50 MCG/ACT nasal spray Place 1 spray into both nostrils 2 (two) times daily. (Patient not taking: Reported on 08/01/2016) 1 g 5  . ipratropium (ATROVENT) 0.06 % nasal spray Place 2 sprays into both nostrils 4 (four) times daily. (Patient not taking: Reported on 08/01/2016) 15 mL 1  . meloxicam (MOBIC) 7.5 MG tablet Take 1 tablet (7.5 mg total) by mouth daily. (Patient not taking: Reported on 08/01/2016) 30 tablet 1  . naproxen (NAPROSYN) 500 MG tablet Take 1 tablet (500 mg total) by mouth 2 (two) times daily. (Patient not taking: Reported on 08/01/2016) 30 tablet 0  . predniSONE (STERAPRED UNI-PAK 21 TAB) 10 MG (21) TBPK tablet Take 6 tabs by mouth on 1st day then 5 tabs on 2nd day, then 4 tabs for 3rd day, then 3  tabs for 4th day then 2 tabs for 5th day, then 1 tab by mouth on day 6. 21 tablet 0   No facility-administered medications prior to visit.     ROS Review of Systems Constitutional: Negative for activity change, appetite change and fatigue.  HENT:       See history of present illness  Eyes: Negative for visual disturbance.  Respiratory: Negative for cough, chest tightness, shortness of breath and wheezing.   Cardiovascular: Negative for chest pain and palpitations.  Gastrointestinal: Negative for abdominal distention, abdominal pain and  constipation.  Endocrine: Negative for polydipsia.  Genitourinary: Negative for dysuria and frequency.  Musculoskeletal: See history of present illness Skin: Negative for rash.  Neurological: Negative for tremors, light-headedness and numbness.  Hematological: Does not bruise/bleed easily.  Psychiatric/Behavioral: Negative for agitation and behavioral problems.   Objective:  BP 107/62 (BP Location: Right Arm, Patient Position: Sitting, Cuff Size: Small)   Pulse 98   Temp 97.8 F (36.6 C) (Oral)   Ht 5\' 7"  (1.702 m)   Wt 171 lb 9.6 oz (77.8 kg)   SpO2 99%   BMI 26.88 kg/m   BP/Weight 08/01/2016 07/29/2016 [id number]  Systolic BP [id number]  Diastolic BP 62 69 76  Wt. (Lbs) 171.6 160 -  BMI 26.88 25.06 -      Physical Exam  Constitutional: She is oriented to person, place, and time. She appears well-developed and well-nourished.  Cardiovascular: Normal rate, normal heart sounds and intact distal pulses.   No murmur heard. Pulmonary/Chest: Effort normal and breath sounds normal. She has no wheezes. She has no rales. She exhibits no tenderness.  Abdominal: Soft. Bowel sounds are normal. She exhibits no distension and no mass. There is no tenderness.  Musculoskeletal: She exhibits edema (slight edema of lefee) and tenderness (Tenderness on range of motion of left knee).  Neurological: She is alert and oriented to person, place, and time.     Assessment & Plan:   1. Primary localized osteoarthritis of knee She has been encouraged to pick up her prescription for naproxen (which she received from the ED) from the pharmacy Apply RICE protocol - methylPREDNISolone sodium succinate (SOLU-MEDROL) 125 mg/2 mL injection 62.5 mg; Inject 1 mL (62.5 mg total) into the muscle once. - Ambulatory referral to Orthopedic Surgery  2. Chronic ethmoidal sinusitis Recurrent Continue Flonase, Zyrtec - Ambulatory referral to ENT   Meds ordered this encounter  Medications  .  methylPREDNISolone sodium succinate (SOLU-MEDROL) 125 mg/2 mL injection 62.5 mg    Follow-up: Return in about 3 months (around 10/30/2016) for Follow-up of chronic medical conditions.Arnoldo Morale MD

## 2016-08-01 NOTE — Patient Instructions (Signed)

## 2016-08-09 ENCOUNTER — Encounter (INDEPENDENT_AMBULATORY_CARE_PROVIDER_SITE_OTHER): Payer: Self-pay | Admitting: Orthopedic Surgery

## 2016-08-09 ENCOUNTER — Ambulatory Visit (INDEPENDENT_AMBULATORY_CARE_PROVIDER_SITE_OTHER): Payer: Medicaid Other | Admitting: Orthopedic Surgery

## 2016-08-09 DIAGNOSIS — M170 Bilateral primary osteoarthritis of knee: Secondary | ICD-10-CM | POA: Diagnosis not present

## 2016-08-09 MED ORDER — HYDROCODONE-ACETAMINOPHEN 5-325 MG PO TABS: 1.0000 | ORAL_TABLET | Freq: Every day | ORAL | 0 refills | Status: DC

## 2016-08-09 NOTE — Progress Notes (Signed)
Office Visit Note   Patient: Angel Hebert           Date of Birth: 04/24/1956           MRN: [id number] Visit Date: 08/09/2016 Requested by: Arnoldo Morale, MD [place], [place] [address] PCP: Arnoldo Morale, MD  Subjective: Chief Complaint  Patient presents with  . Left Knee - Pain  . Right Knee - Pain    HPI Angel Hebert is a 60 year old female with bilateral knee pain left worse than right.  She was seen in the emergency room 07/29/2016.  At that time aspiration and cortisone injection was performed.  Radiographs from April are reviewed.  End-stage arthritis is present in both knees left worse and right.  Both knees give out.  Reports burning and cracking.  Denies any history of injury.  She is currently unemployed.  She does not use a cane.  She had pneumonia earlier this year.  She has on medication reviewed prescription for mobic and naproxen.  She also is taking trazodone among others.              Review of Systems All systems reviewed are negative as they relate to the chief complaint within the history of present illness.  Patient denies  fevers or chills.    Assessment & Plan: Visit Diagnoses:  1. Primary osteoarthritis of both knees     Plan: Impression is bilateral knee arthritis left worse than right.  She has reasonable flexion but is having some pain with ambulation.  Patient states that she wants to try pain medicine.  How long talk with her today about not becoming addicted to pain medicine for this problem which she is going have to manage.  In general we don't do long-term pain medicine for arthritis.  I did write her a one time prescription for Norco 05/325 one per day maximum that will not be refilled at this office.  I'll see her back as needed.  She is going to check into her insurance issues and see if she can work that out for knee replacement on the left  Follow-Up Instructions: Return if symptoms worsen or fail to improve.   Orders:  No orders of  the defined types were placed in this encounter.  Meds ordered this encounter  Medications  . HYDROcodone-acetaminophen (NORCO/VICODIN) 5-325 MG tablet    Sig: Take 1 tablet by mouth daily.    Dispense:  30 tablet    Refill:  0      Procedures: No procedures performed   Clinical Data: No additional findings.  Objective: Vital Signs: There were no vitals taken for this visit.  Physical Exam   Constitutional: Patient appears well-developed HEENT:  Head: Normocephalic Eyes:EOM are normal Neck: Normal range of motion Cardiovascular: Normal rate Pulmonary/chest: Effort normal Neurologic: Patient is alert Skin: Skin is warm Psychiatric: Patient has normal mood and affect     Ortho Exam examination of both knees demonstrates palpable pedal pulses mild effusion in each knee about a 5 flexion contracture in each knee.  These flex to about 115.  On the left knee collateral cruciate ligaments are stable there is no groin pain with internal rotation leg skin is intact in the knee region extensor mechanism is intact collateral crucial ligaments are stable  Specialty Comments:  No specialty comments available.  Imaging: No results found.   PMFS History: Patient Active Problem List   Diagnosis Date Noted  . Primary localized osteoarthritis of knee  12/13/2015  . Benign essential HTN   . Physical deconditioning   . Anemia, iron deficiency   . Pressure ulcer 10/14/2015  . CAP (community acquired pneumonia)   . Pleural effusion   . Sepsis ([place])   . Sepsis due to Streptococcus pneumoniae ([place])   . Bacteremia   . Community acquired pneumonia 10/08/2015  . Acute sinusitis 12/02/2014  . Sinusitis 03/17/2013  . Hyperlipidemia 03/02/2007  . HYPERTENSION 03/02/2007  . Allergic rhinitis 03/02/2007  . Asthma 03/02/2007  . GERD 03/02/2007   Past Medical History:  Diagnosis Date  . Asthma   . GERD (gastroesophageal reflux disease)   . High cholesterol   . Hypertension       Family History  Problem Relation Age of Onset  . Colon cancer Neg Hx     Past Surgical History:  Procedure Laterality Date  . ABDOMINAL HYSTERECTOMY     Social History   Occupational History  . Not on file.   Social History Main Topics  . Smoking status: Never Smoker  . Smokeless tobacco: Never Used  . Alcohol use No  . Drug use: No  . Sexual activity: Not on file

## 2016-08-13 ENCOUNTER — Encounter: Payer: Self-pay | Admitting: Family Medicine

## 2016-08-13 ENCOUNTER — Ambulatory Visit: Payer: Self-pay | Attending: Family Medicine | Admitting: Family Medicine

## 2016-08-13 VITALS — BP 137/84 | HR 94 | Temp 97.5°F | Ht 67.0 in | Wt 173.0 lb

## 2016-08-13 DIAGNOSIS — K219 Gastro-esophageal reflux disease without esophagitis: Secondary | ICD-10-CM | POA: Insufficient documentation

## 2016-08-13 DIAGNOSIS — J322 Chronic ethmoidal sinusitis: Secondary | ICD-10-CM | POA: Insufficient documentation

## 2016-08-13 DIAGNOSIS — Z79899 Other long term (current) drug therapy: Secondary | ICD-10-CM | POA: Insufficient documentation

## 2016-08-13 DIAGNOSIS — M1712 Unilateral primary osteoarthritis, left knee: Secondary | ICD-10-CM | POA: Insufficient documentation

## 2016-08-13 DIAGNOSIS — M1710 Unilateral primary osteoarthritis, unspecified knee: Secondary | ICD-10-CM

## 2016-08-13 DIAGNOSIS — E7800 Pure hypercholesterolemia, unspecified: Secondary | ICD-10-CM | POA: Insufficient documentation

## 2016-08-13 DIAGNOSIS — J45909 Unspecified asthma, uncomplicated: Secondary | ICD-10-CM | POA: Insufficient documentation

## 2016-08-13 DIAGNOSIS — I10 Essential (primary) hypertension: Secondary | ICD-10-CM | POA: Insufficient documentation

## 2016-08-13 MED ORDER — NAPROXEN 500 MG PO TABS: 500.0000 mg | ORAL_TABLET | Freq: Two times a day (BID) | ORAL | 2 refills | Status: DC

## 2016-08-13 NOTE — Progress Notes (Signed)
Medication refill for naproxen

## 2016-08-13 NOTE — Progress Notes (Signed)
Subjective:  Patient ID: Angel Hebert, female    DOB: [birth year]  Age: 60 y.o. MRN: [id number]  CC: Nasal Congestion and Cough (dry)   HPI Angel Hebert  Is a 60 year old female with a history of hypertension, hyperlipidemia, asthma, allergic rhinitis, nasal polyps, GERD who presents to the clinic complaining of nasal congestion She had been referred to ENT at her last office visit for chronic sinusitis unresponsive to current management.   Also has an upcoming appointment with orthopedics for evaluation of left knee pain and is requesting refill of naproxen.  X-rays from 12/2015 revealed tricompartmental osteoarthritis most severe in the medial femorotibial compartment, left greater than right, moderate left joint effusion, small right joint effusion. She has had repeated arthrocentesis in the past according to patient. Pain is worse with walking.  She has also had several ED visits for chronic sinusitis and has received antibiotics at least monthly in the last 6 months. She was previously informed by ENT that she would need surgery however she never followed through due to the cost.  Past Medical History:  Diagnosis Date  . Asthma   . GERD (gastroesophageal reflux disease)   . High cholesterol   . Hypertension     Past Surgical History:  Procedure Laterality Date  . ABDOMINAL HYSTERECTOMY      No Known Allergies   Outpatient Medications Prior to Visit  Medication Sig Dispense Refill  . albuterol (PROVENTIL HFA;VENTOLIN HFA) 108 (90 Base) MCG/ACT inhaler Inhale 1-2 puffs into the lungs every 6 (six) hours as needed for wheezing or shortness of breath. 1 Inhaler 2  . albuterol (PROVENTIL) (2.5 MG/3ML) 0.083% nebulizer solution Take 3 mLs (2.5 mg total) by nebulization every 6 (six) hours as needed for wheezing or shortness of breath. 150 mL 1  . atorvastatin (LIPITOR) 10 MG tablet TAKE 1 TABLET BY MOUTH DAILY AT 6 PM FOR CHOLESTEROL 30 tablet 5  . cetirizine (ZYRTEC) 10 MG tablet Take  1 tablet (10 mg total) by mouth daily. 30 tablet 5  . fluticasone (FLONASE) 50 MCG/ACT nasal spray Place 1 spray into both nostrils 2 (two) times daily. 1 g 5  . Fluticasone-Salmeterol (ADVAIR) 500-50 MCG/DOSE AEPB Inhale 1 puff into the lungs every 12 (twelve) hours. 60 each 5  . hydrochlorothiazide (HYDRODIURIL) 25 MG tablet TAKE 1/2 TABLET BY MOUTH DAILY FOR BLOOD PRESSURE 30 tablet 5  . HYDROcodone-acetaminophen (NORCO/VICODIN) 5-325 MG tablet Take 1 tablet by mouth daily. 30 tablet 0  . ipratropium (ATROVENT) 0.06 % nasal spray Place 2 sprays into both nostrils 4 (four) times daily. 15 mL 1  . irbesartan (AVAPRO) 150 MG tablet TAKE 1 TABLET BY MOUTH DAILY AFTER BREAKFAST FOR BLOOD PRESSURE 30 tablet 5  . montelukast (SINGULAIR) 10 MG tablet Take 1 tablet (10 mg total) by mouth at bedtime. 30 tablet 5  . pantoprazole (PROTONIX) 20 MG tablet Take 1 tablet (20 mg total) by mouth daily. 30 tablet 5  . traZODone (DESYREL) 50 MG tablet Take 1 tablet (50 mg total) by mouth at bedtime. 30 tablet 5  . meloxicam (MOBIC) 7.5 MG tablet Take 1 tablet (7.5 mg total) by mouth daily. (Patient not taking: Reported on 08/13/2016) 30 tablet 1  . naproxen (NAPROSYN) 500 MG tablet Take 1 tablet (500 mg total) by mouth 2 (two) times daily. (Patient not taking: Reported on 08/13/2016) 30 tablet 0   No facility-administered medications prior to visit.     ROS Review of Systems Constitutional: Negative for activity change, appetite change  and fatigue.  HENT:       See history of present illness  Eyes: Negative for visual disturbance.  Respiratory: Negative for cough, chest tightness, shortness of breath and wheezing.   Cardiovascular: Negative for chest pain and palpitations.  Gastrointestinal: Negative for abdominal distention, abdominal pain and constipation.  Endocrine: Negative for polydipsia.  Genitourinary: Negative for dysuria and frequency.  Musculoskeletal: See history of present illness Skin:  Negative for rash.  Neurological: Negative for tremors, light-headedness and numbness.  Hematological: Does not bruise/bleed easily.  Psychiatric/Behavioral: Negative for agitation and behavioral problems Objective:  BP 137/84 (BP Location: Right Arm, Patient Position: Sitting, Cuff Size: Small)   Pulse 94   Temp 97.5 F (36.4 C) (Oral)   Ht 5\' 7"  (1.702 m)   Wt 173 lb (78.5 kg)   SpO2 98%   BMI 27.10 kg/m   Wt Readings from Last 3 Encounters:  08/13/16 173 lb (78.5 kg)  08/01/16 171 lb 9.6 oz (77.8 kg)  07/29/16 160 lb (72.6 kg)    BP/Weight 08/13/2016 08/01/2016 0000000  Systolic BP 0000000 [id number] [id number]  Diastolic BP 84 62 69  Wt. (Lbs) 173 171.6 160  BMI 27.1 26.88 25.06      Physical Exam Constitutional: She is oriented to person, place, and time. She appears well-developed and well-nourished.  HEENT: Nasal speech, no sinus tenderness, normal oropharynx, normal TM Cardiovascular: Normal rate, normal heart sounds and intact distal pulses.   No murmur heard. Pulmonary/Chest: Effort normal and breath sounds normal. She has no wheezes. She has no rales. She exhibits no tenderness.  Abdominal: Soft. Bowel sounds are normal. She exhibits no distension and no mass. There is no tenderness.  Musculoskeletal: She exhibits edema (slight edema of lefee) and tenderness (Tenderness on range of motion of left knee).  Neurological: She is alert and oriented to person, place, and time.     Assessment & Plan:   1. Chronic ethmoidal sinusitis She received Solu-Medrol less than 2 weeks ago and has had multiple doses of steroids and antibiotics in the last few months with resulting weight gain. Educated about side effects of steroids- in fact that she is gaining weight significantly (gained 13 pounds in 3 weeks) and is at risk of developing diabetes Continue Flonase, Zyrtec and other nasal decongestant and await appointment for ENT Reviewed referral now to the patient which indicated the  patient was made aware that referral could take a long time.  2. Osteoarthritis of the knee Referred to orthopedic Continue naproxen  Meds ordered this encounter  Medications  . naproxen (NAPROSYN) 500 MG tablet    Sig: Take 1 tablet (500 mg total) by mouth 2 (two) times daily.    Dispense:  60 tablet    Refill:  2    Follow-up: Return in about 3 months (around 11/11/2016) for follow up of chronic medical conditions.   Arnoldo Morale MD

## 2016-08-19 ENCOUNTER — Telehealth (INDEPENDENT_AMBULATORY_CARE_PROVIDER_SITE_OTHER): Payer: Self-pay | Admitting: Orthopedic Surgery

## 2016-08-19 NOTE — Telephone Encounter (Signed)
Patient called and would like to schedule knee surgery. Please forward surgery sheet and we will get her scheduled.

## 2016-08-19 NOTE — Telephone Encounter (Signed)
Okay to schedule blue sheet on my desk

## 2016-08-20 ENCOUNTER — Other Ambulatory Visit (INDEPENDENT_AMBULATORY_CARE_PROVIDER_SITE_OTHER): Payer: Self-pay | Admitting: Orthopedic Surgery

## 2016-08-20 DIAGNOSIS — M1712 Unilateral primary osteoarthritis, left knee: Secondary | ICD-10-CM

## 2016-08-31 ENCOUNTER — Emergency Department (HOSPITAL_COMMUNITY)
Admission: EM | Admit: 2016-08-31 | Discharge: 2016-08-31 | Disposition: A | Discharge status: Home / Self Care | Payer: Self-pay | Attending: Emergency Medicine | Admitting: Emergency Medicine

## 2016-08-31 ENCOUNTER — Encounter (HOSPITAL_COMMUNITY): Payer: Self-pay | Admitting: Emergency Medicine

## 2016-08-31 DIAGNOSIS — J339 Nasal polyp, unspecified: Secondary | ICD-10-CM | POA: Insufficient documentation

## 2016-08-31 DIAGNOSIS — J45909 Unspecified asthma, uncomplicated: Secondary | ICD-10-CM | POA: Insufficient documentation

## 2016-08-31 DIAGNOSIS — I10 Essential (primary) hypertension: Secondary | ICD-10-CM | POA: Insufficient documentation

## 2016-08-31 DIAGNOSIS — J329 Chronic sinusitis, unspecified: Secondary | ICD-10-CM | POA: Insufficient documentation

## 2016-08-31 MED ORDER — DEXAMETHASONE SODIUM PHOSPHATE 10 MG/ML IJ SOLN
10.0000 mg | Freq: Once | INTRAMUSCULAR | Status: AC
  Administered 2016-08-31: 10 mg via INTRAMUSCULAR
  Filled 2016-08-31: qty 1

## 2016-08-31 NOTE — ED Notes (Signed)
ED Provider at bedside. 

## 2016-08-31 NOTE — ED Provider Notes (Signed)
[facility] DEPT Provider Note   CSN: [id number] Arrival date & time: 08/31/16  1116   By signing my name below, I, [name], attest that this documentation has been prepared under the direction and in the presence of  Gloriann Loan, PA-C. Electronically Signed: [name], ED Scribe. 08/31/16. 12:09 PM.   History   Chief Complaint Chief Complaint  Patient presents with  . Cough  . Nasal Congestion   The history is provided by the patient. No language interpreter was used.   HPI Comments:  Angel Hebert is a 60 y.o. female, with a hx of nasal polyps and asthma, who presents to the Emergency Department complaining of ongoing, moderate nasal congestion x 2 weeks. Pt also reports difficulty breathing. She has been taking antibiotics, been treated with steroids and has been taking over the counter medications with no relief. Pt denies being followed by an ENT specialist, fevers, vision changes, any other associated symptoms and any other modifying factors at this time.    Past Medical History:  Diagnosis Date  . Asthma   . GERD (gastroesophageal reflux disease)   . High cholesterol   . Hypertension     Patient Active Problem List   Diagnosis Date Noted  . Primary localized osteoarthritis of knee 12/13/2015  . Benign essential HTN   . Physical deconditioning   . Anemia, iron deficiency   . Pressure ulcer 10/14/2015  . CAP (community acquired pneumonia)   . Pleural effusion   . Sepsis ([place])   . Sepsis due to Streptococcus pneumoniae ([place])   . Bacteremia   . Community acquired pneumonia 10/08/2015  . Acute sinusitis 12/02/2014  . Sinusitis 03/17/2013  . Hyperlipidemia 03/02/2007  . HYPERTENSION 03/02/2007  . Allergic rhinitis 03/02/2007  . Asthma 03/02/2007  . GERD 03/02/2007    Past Surgical History:  Procedure Laterality Date  . ABDOMINAL HYSTERECTOMY      OB History    No data available       Home Medications    Prior to Admission medications     Medication Sig Start Date End Date Taking? Authorizing Provider  albuterol (PROVENTIL HFA;VENTOLIN HFA) 108 (90 Base) MCG/ACT inhaler Inhale 1-2 puffs into the lungs every 6 (six) hours as needed for wheezing or shortness of breath. 06/21/16   Arnoldo Morale, MD  albuterol (PROVENTIL) (2.5 MG/3ML) 0.083% nebulizer solution Take 3 mLs (2.5 mg total) by nebulization every 6 (six) hours as needed for wheezing or shortness of breath. 06/21/16   Arnoldo Morale, MD  atorvastatin (LIPITOR) 10 MG tablet TAKE 1 TABLET BY MOUTH DAILY AT 6 PM FOR CHOLESTEROL 05/20/16   Arnoldo Morale, MD  cetirizine (ZYRTEC) 10 MG tablet Take 1 tablet (10 mg total) by mouth daily. 05/20/16   Arnoldo Morale, MD  fluticasone (FLONASE) 50 MCG/ACT nasal spray Place 1 spray into both nostrils 2 (two) times daily. 05/20/16   Arnoldo Morale, MD  Fluticasone-Salmeterol (ADVAIR) 500-50 MCG/DOSE AEPB Inhale 1 puff into the lungs every 12 (twelve) hours. 05/20/16   Arnoldo Morale, MD  hydrochlorothiazide (HYDRODIURIL) 25 MG tablet TAKE 1/2 TABLET BY MOUTH DAILY FOR BLOOD PRESSURE 05/20/16   Arnoldo Morale, MD  HYDROcodone-acetaminophen (NORCO/VICODIN) 5-325 MG tablet Take 1 tablet by mouth daily. 08/09/16   Meredith Pel, MD  ipratropium (ATROVENT) 0.06 % nasal spray Place 2 sprays into both nostrils 4 (four) times daily. 04/26/16   Billy Fischer, MD  irbesartan (AVAPRO) 150 MG tablet TAKE 1 TABLET BY MOUTH DAILY AFTER BREAKFAST FOR BLOOD PRESSURE  05/20/16   Arnoldo Morale, MD  meloxicam (MOBIC) 7.5 MG tablet Take 1 tablet (7.5 mg total) by mouth daily. Patient not taking: Reported on 08/13/2016 06/21/16   Arnoldo Morale, MD  montelukast (SINGULAIR) 10 MG tablet Take 1 tablet (10 mg total) by mouth at bedtime. 05/20/16   Arnoldo Morale, MD  naproxen (NAPROSYN) 500 MG tablet Take 1 tablet (500 mg total) by mouth 2 (two) times daily. 08/13/16   Arnoldo Morale, MD  pantoprazole (PROTONIX) 20 MG tablet Take 1 tablet (20 mg total) by mouth daily. 05/20/16   Arnoldo Morale, MD  traZODone (DESYREL) 50 MG tablet Take 1 tablet (50 mg total) by mouth at bedtime. 05/20/16   Arnoldo Morale, MD    Family History Family History  Problem Relation Age of Onset  . Colon cancer Neg Hx     Social History Social History  Substance Use Topics  . Smoking status: Never Smoker  . Smokeless tobacco: Never Used  . Alcohol use No     Allergies   Patient has no known allergies.   Review of Systems Review of Systems 10 systems reviewed and all are negative for acute change except as noted in the HPI.  Physical Exam Updated Vital Signs BP 115/79   Pulse 109   Temp 97.9 F (36.6 C) (Oral)   Resp 22   Ht 5\' 7"  (1.702 m)   Wt 77.1 kg   SpO2 99%   BMI 26.63 kg/m   Physical Exam  Constitutional: She is oriented to person, place, and time. She appears well-developed and well-nourished. She is active.  Non-toxic appearance. She does not have a sickly appearance. She does not appear ill.  HENT:  Head: Normocephalic and atraumatic.  Right Ear: Tympanic membrane and external ear normal. Tympanic membrane is not erythematous and not bulging.  Left Ear: Tympanic membrane and external ear normal. Tympanic membrane is not erythematous and not bulging.  Nose: Mucosal edema present.  Mouth/Throat: Uvula is midline, oropharynx is clear and moist and mucous membranes are normal. No trismus in the jaw. No uvula swelling. No oropharyngeal exudate, posterior oropharyngeal edema, posterior oropharyngeal erythema or tonsillar abscesses.  Bilateral nasal polyps and mucosal edema without purulent discharge.  Neck: Normal range of motion. Neck supple.  No nuchal rigidity.   Cardiovascular: Normal rate and regular rhythm.   Pulmonary/Chest: Effort normal and breath sounds normal. No respiratory distress. She has no wheezes. She has no rales.  Abdominal: Soft. Bowel sounds are normal. She exhibits no distension. There is no tenderness.  Musculoskeletal: Normal range of motion.    Lymphadenopathy:    She has no cervical adenopathy.  Neurological: She is alert and oriented to person, place, and time.  Skin: Skin is warm and dry.  Psychiatric: She has a normal mood and affect. Her behavior is normal.     ED Treatments / Results  DIAGNOSTIC STUDIES:  Oxygen Saturation is 99% on RA, normal by my interpretation.    COORDINATION OF CARE:  12:07 PM Discussed treatment plan with pt at bedside and pt agreed to plan.  Labs (all labs ordered are listed, but only abnormal results are displayed) Labs Reviewed - No data to display  EKG  EKG Interpretation None       Radiology No results found.  Procedures Procedures (including critical care time)  Medications Ordered in ED Medications  dexamethasone (DECADRON) injection 10 mg (10 mg Intramuscular Given 08/31/16 1213)     Initial Impression / Assessment and Plan /  ED Course  I have reviewed the triage vital signs and the nursing notes.  Pertinent labs & imaging results that were available during my care of the patient were reviewed by me and considered in my medical decision making (see chart for details).  Clinical Course    Patient with multiple ED visits for the same.  Has been told by ENT she needs surgery for nasal polyp removal.  She has no systemic symptoms.  I do not suspect bacterial sinusitis or deep space infection.  No visual changes.  She has been on antibiotics recently, and I do not think they are indicated based on clinical findings.  She was given IM Decadron in ED and given follow up for ENT.  Return precautions discussed.  Stable for discharge.   Final Clinical Impressions(s) / ED Diagnoses   Final diagnoses:  Chronic sinusitis, unspecified location  Nasal polyps    New Prescriptions Discharge Medication List as of 08/31/2016 12:08 PM     I personally performed the services described in this documentation, which was scribed in my presence. The recorded information has been  reviewed and is accurate.     Gloriann Loan, PA-C 08/31/16 2038    Lajean Saver, MD 09/01/16 [phone]

## 2016-08-31 NOTE — Discharge Instructions (Signed)
Call the ENT doctor to schedule an appointment as soon as possible for further management of your nasal polyps.

## 2016-08-31 NOTE — ED Triage Notes (Signed)
Pt c/o cough and nasal congestion ongoing for 2 weeks. Pt has tried over the counter mediations without relief. Pt reports that she has nasal polyps that she needs to have surgery on.

## 2016-09-05 DIAGNOSIS — J324 Chronic pansinusitis: Secondary | ICD-10-CM | POA: Insufficient documentation

## 2016-09-17 ENCOUNTER — Other Ambulatory Visit: Payer: Self-pay

## 2016-09-20 NOTE — Pre-Procedure Instructions (Signed)
Angel Hebert  09/20/2016      [id number] - [place], [address] [place] [address] Phone: [phone] Fax: [place], [address] [place] [address] Phone: [phone] Fax: [phone]    Your procedure is scheduled on January, 30th, Tuesday.   Report to [facility] Admitting at 10:15 AM             (posted surgery time 12:15 pm - 3:05 pm)   Call this number if you have problems the morning of surgery:  [phone]   Remember:  Do not eat food or drink liquids after midnight Monday.   Take these medicines the morning of surgery with A SIP OF WATER : Protonix, Claritin, Hydrocodone.  Please use your inhalers that morning.              4-5 days prior to surgery, STOP TAKING any Vitamins, Herbal Supplements, Anti-inflammatories.   Do not wear jewelry, make-up or nail polish.  Do not wear lotions, powders,  perfumes, or deoderant.   Do not shave underarms & legs 48 hours prior to surgery.    Do not bring valuables to the hospital.  [facility] is not responsible for any belongings or valuables.  Contacts, dentures or bridgework may not be worn into surgery.  Leave your suitcase in the car.  After surgery it may be brought to your room.  For patients admitted to the hospital, discharge time will be determined by your treatment team.  Please read over the following fact sheets that you were given. Pain Booklet, MRSA Information and Surgical Site Infection Prevention

## 2016-09-23 ENCOUNTER — Encounter (HOSPITAL_COMMUNITY)
Admission: RE | Admit: 2016-09-23 | Discharge: 2016-09-23 | Disposition: A | Discharge status: Home / Self Care | Payer: BLUE CROSS/BLUE SHIELD | Source: Ambulatory Visit | Attending: Orthopedic Surgery | Admitting: Orthopedic Surgery

## 2016-09-23 ENCOUNTER — Ambulatory Visit (HOSPITAL_COMMUNITY)
Admission: RE | Admit: 2016-09-23 | Discharge: 2016-09-23 | Disposition: A | Discharge status: Home / Self Care | Payer: BLUE CROSS/BLUE SHIELD | Source: Ambulatory Visit | Attending: Orthopedic Surgery | Admitting: Orthopedic Surgery

## 2016-09-23 ENCOUNTER — Encounter (HOSPITAL_COMMUNITY): Payer: Self-pay

## 2016-09-23 DIAGNOSIS — Z01812 Encounter for preprocedural laboratory examination: Secondary | ICD-10-CM | POA: Diagnosis not present

## 2016-09-23 DIAGNOSIS — Z01818 Encounter for other preprocedural examination: Secondary | ICD-10-CM | POA: Insufficient documentation

## 2016-09-23 DIAGNOSIS — Z01810 Encounter for preprocedural cardiovascular examination: Secondary | ICD-10-CM | POA: Insufficient documentation

## 2016-09-23 HISTORY — DX: Dyspnea, unspecified: R06.00

## 2016-09-23 HISTORY — DX: Unspecified osteoarthritis, unspecified site: M19.90

## 2016-09-23 LAB — COMPREHENSIVE METABOLIC PANEL
ALT: 8 U/L — AB (ref 14–54)
AST: 18 U/L (ref 15–41)
Albumin: 3.8 g/dL (ref 3.5–5.0)
Alkaline Phosphatase: 73 U/L (ref 38–126)
Anion gap: 7 (ref 5–15)
BILIRUBIN TOTAL: 0.3 mg/dL (ref 0.3–1.2)
BUN: 6 mg/dL (ref 6–20)
CO2: 25 mmol/L (ref 22–32)
CREATININE: 0.7 mg/dL (ref 0.44–1.00)
Calcium: 9.2 mg/dL (ref 8.9–10.3)
Chloride: 103 mmol/L (ref 101–111)
GFR calc Af Amer: >60 mL/min (ref >60)
Glucose, Bld: 91 mg/dL (ref 65–99)
POTASSIUM: 3.6 mmol/L (ref 3.5–5.1)
Sodium: 135 mmol/L (ref 135–145)
TOTAL PROTEIN: 7.1 g/dL (ref 6.5–8.1)

## 2016-09-23 LAB — URINALYSIS, ROUTINE W REFLEX MICROSCOPIC
BILIRUBIN URINE: NEGATIVE
Glucose, UA: NEGATIVE mg/dL
Ketones, ur: NEGATIVE mg/dL
LEUKOCYTES UA: NEGATIVE
NITRITE: NEGATIVE
Protein, ur: NEGATIVE mg/dL
SPECIFIC GRAVITY, URINE: 1.009 (ref 1.005–1.030)
pH: 5 (ref 5.0–8.0)

## 2016-09-23 LAB — TYPE AND SCREEN
ABO/RH(D): B POS
ANTIBODY SCREEN: NEGATIVE

## 2016-09-23 LAB — CBC
HEMATOCRIT: 33.8 % — AB (ref 36.0–46.0)
HEMOGLOBIN: 10.9 g/dL — AB (ref 12.0–15.0)
MCH: 29.5 pg (ref 26.0–34.0)
MCHC: 32.2 g/dL (ref 30.0–36.0)
MCV: 91.4 fL (ref 78.0–100.0)
Platelets: 510 10*3/uL — ABNORMAL HIGH (ref 150–400)
RBC: 3.7 MIL/uL — ABNORMAL LOW (ref 3.87–5.11)
RDW: 16.3 % — ABNORMAL HIGH (ref 11.5–15.5)
WBC: 8.3 10*3/uL (ref 4.0–10.5)

## 2016-09-23 LAB — SURGICAL PCR SCREEN
MRSA, PCR: NEGATIVE
Staphylococcus aureus: NEGATIVE

## 2016-09-23 NOTE — Progress Notes (Signed)
PCP is Dr. Arnoldo Morale at [facility]. LOV 08/2016 Denies any cardiac issues.  Has never been to see a cardio. This past Tuesday, the 16th, she had nasal polyps removed by Dr. Janace Hoard at [facility]..  No active bleeding or problems and her f/u is on Thursday.

## 2016-09-24 LAB — URINE CULTURE

## 2016-09-30 MED ORDER — TRANEXAMIC ACID 1000 MG/10ML IV SOLN
1000.0000 mg | INTRAVENOUS | Status: AC
  Administered 2016-10-01: 1000 mg via INTRAVENOUS
  Filled 2016-09-30: qty 10

## 2016-09-30 MED ORDER — CEFAZOLIN SODIUM-DEXTROSE 2-4 GM/100ML-% IV SOLN
2.0000 g | INTRAVENOUS | Status: AC
  Administered 2016-10-01: 2 g via INTRAVENOUS
  Filled 2016-09-30: qty 100

## 2016-10-01 ENCOUNTER — Encounter (HOSPITAL_COMMUNITY)
Admission: RE | Disposition: A | Discharge status: Home Health | Payer: Self-pay | Source: Ambulatory Visit | Attending: Orthopedic Surgery

## 2016-10-01 ENCOUNTER — Inpatient Hospital Stay (HOSPITAL_COMMUNITY)
Admission: RE | Admit: 2016-10-01 | Discharge: 2016-10-04 | DRG: 470 | Disposition: A | Discharge status: Home Health | Payer: BLUE CROSS/BLUE SHIELD | Source: Ambulatory Visit | Attending: Orthopedic Surgery | Admitting: Orthopedic Surgery

## 2016-10-01 ENCOUNTER — Inpatient Hospital Stay (HOSPITAL_COMMUNITY): Payer: BLUE CROSS/BLUE SHIELD | Admitting: Anesthesiology

## 2016-10-01 ENCOUNTER — Encounter (HOSPITAL_COMMUNITY): Payer: Self-pay | Admitting: *Deleted

## 2016-10-01 DIAGNOSIS — Z79899 Other long term (current) drug therapy: Secondary | ICD-10-CM

## 2016-10-01 DIAGNOSIS — I10 Essential (primary) hypertension: Secondary | ICD-10-CM | POA: Diagnosis present

## 2016-10-01 DIAGNOSIS — D509 Iron deficiency anemia, unspecified: Secondary | ICD-10-CM | POA: Diagnosis present

## 2016-10-01 DIAGNOSIS — K219 Gastro-esophageal reflux disease without esophagitis: Secondary | ICD-10-CM | POA: Diagnosis present

## 2016-10-01 DIAGNOSIS — J45909 Unspecified asthma, uncomplicated: Secondary | ICD-10-CM | POA: Diagnosis present

## 2016-10-01 DIAGNOSIS — E7800 Pure hypercholesterolemia, unspecified: Secondary | ICD-10-CM | POA: Diagnosis present

## 2016-10-01 DIAGNOSIS — M1712 Unilateral primary osteoarthritis, left knee: Secondary | ICD-10-CM | POA: Diagnosis present

## 2016-10-01 DIAGNOSIS — M25562 Pain in left knee: Secondary | ICD-10-CM | POA: Diagnosis present

## 2016-10-01 DIAGNOSIS — M1710 Unilateral primary osteoarthritis, unspecified knee: Secondary | ICD-10-CM | POA: Diagnosis present

## 2016-10-01 HISTORY — PX: TOTAL KNEE ARTHROPLASTY: SHX125

## 2016-10-01 SURGERY — ARTHROPLASTY, KNEE, TOTAL
Anesthesia: Spinal | Site: Knee | Laterality: Left

## 2016-10-01 MED ORDER — TRAZODONE HCL 50 MG PO TABS
50.0000 mg | ORAL_TABLET | Freq: Every day | ORAL | Status: DC
  Administered 2016-10-01 – 2016-10-03 (×3): 50 mg via ORAL
  Filled 2016-10-01 (×3): qty 1

## 2016-10-01 MED ORDER — FENTANYL CITRATE (PF) 100 MCG/2ML IJ SOLN
INTRAMUSCULAR | Status: DC | PRN
  Administered 2016-10-01: 50 ug via INTRAVENOUS

## 2016-10-01 MED ORDER — CEFAZOLIN SODIUM-DEXTROSE 2-4 GM/100ML-% IV SOLN
2.0000 g | Freq: Four times a day (QID) | INTRAVENOUS | Status: AC
  Administered 2016-10-01 (×2): 2 g via INTRAVENOUS
  Filled 2016-10-01 (×2): qty 100

## 2016-10-01 MED ORDER — PROPOFOL 10 MG/ML IV BOLUS
INTRAVENOUS | Status: AC
  Filled 2016-10-01: qty 20

## 2016-10-01 MED ORDER — MORPHINE SULFATE (PF) 4 MG/ML IV SOLN
4.0000 mg | INTRAVENOUS | Status: DC | PRN
  Administered 2016-10-01: 4 mg via INTRAVENOUS
  Filled 2016-10-01: qty 1

## 2016-10-01 MED ORDER — POTASSIUM CHLORIDE IN NACL 20-0.9 MEQ/L-% IV SOLN
INTRAVENOUS | Status: AC
  Administered 2016-10-01: 18:00:00 via INTRAVENOUS
  Filled 2016-10-01: qty 1000

## 2016-10-01 MED ORDER — ALBUTEROL SULFATE HFA 108 (90 BASE) MCG/ACT IN AERS: 1.0000 | INHALATION_SPRAY | Freq: Four times a day (QID) | RESPIRATORY_TRACT | Status: DC | PRN

## 2016-10-01 MED ORDER — IRBESARTAN 150 MG PO TABS
150.0000 mg | ORAL_TABLET | Freq: Every day | ORAL | Status: DC
  Administered 2016-10-02 – 2016-10-04 (×3): 150 mg via ORAL
  Filled 2016-10-01 (×5): qty 1

## 2016-10-01 MED ORDER — BUPIVACAINE HCL (PF) 0.5 % IJ SOLN
INTRAMUSCULAR | Status: DC | PRN
  Administered 2016-10-01: 3 mL via INTRATHECAL

## 2016-10-01 MED ORDER — ONDANSETRON HCL 4 MG/2ML IJ SOLN: 4.0000 mg | Freq: Four times a day (QID) | INTRAMUSCULAR | Status: DC | PRN

## 2016-10-01 MED ORDER — FENTANYL CITRATE (PF) 100 MCG/2ML IJ SOLN
INTRAMUSCULAR | Status: AC
  Administered 2016-10-01: 50 ug via INTRAVENOUS
  Filled 2016-10-01: qty 2

## 2016-10-01 MED ORDER — FENTANYL CITRATE (PF) 100 MCG/2ML IJ SOLN
50.0000 ug | Freq: Once | INTRAMUSCULAR | Status: AC
  Administered 2016-10-01: 50 ug via INTRAVENOUS

## 2016-10-01 MED ORDER — METOCLOPRAMIDE HCL 5 MG PO TABS: 5.0000 mg | ORAL_TABLET | Freq: Three times a day (TID) | ORAL | Status: DC | PRN

## 2016-10-01 MED ORDER — SODIUM CHLORIDE 0.9% FLUSH
INTRAVENOUS | Status: DC | PRN
  Administered 2016-10-01: 20 mL

## 2016-10-01 MED ORDER — BUPIVACAINE HCL (PF) 0.25 % IJ SOLN
INTRAMUSCULAR | Status: AC
  Filled 2016-10-01: qty 30

## 2016-10-01 MED ORDER — BUPIVACAINE-EPINEPHRINE (PF) 0.5% -1:200000 IJ SOLN
INTRAMUSCULAR | Status: AC
  Filled 2016-10-01: qty 30

## 2016-10-01 MED ORDER — ONDANSETRON HCL 4 MG PO TABS: 4.0000 mg | ORAL_TABLET | Freq: Four times a day (QID) | ORAL | Status: DC | PRN

## 2016-10-01 MED ORDER — MIDAZOLAM HCL 2 MG/2ML IJ SOLN
INTRAMUSCULAR | Status: AC
  Administered 2016-10-01: 1 mg via INTRAVENOUS
  Filled 2016-10-01: qty 2

## 2016-10-01 MED ORDER — HYDROCHLOROTHIAZIDE 25 MG PO TABS
12.5000 mg | ORAL_TABLET | Freq: Every day | ORAL | Status: DC
  Administered 2016-10-02 – 2016-10-04 (×3): 12.5 mg via ORAL
  Filled 2016-10-01 (×4): qty 1

## 2016-10-01 MED ORDER — MIDAZOLAM HCL 2 MG/2ML IJ SOLN
INTRAMUSCULAR | Status: AC
  Filled 2016-10-01: qty 2

## 2016-10-01 MED ORDER — PHENYLEPHRINE HCL 10 MG/ML IJ SOLN
INTRAVENOUS | Status: DC | PRN
  Administered 2016-10-01: 30 ug/min via INTRAVENOUS

## 2016-10-01 MED ORDER — MOMETASONE FURO-FORMOTEROL FUM 200-5 MCG/ACT IN AERO
2.0000 | INHALATION_SPRAY | Freq: Two times a day (BID) | RESPIRATORY_TRACT | Status: DC
  Administered 2016-10-01 – 2016-10-04 (×6): 2 via RESPIRATORY_TRACT
  Filled 2016-10-01: qty 8.8

## 2016-10-01 MED ORDER — MORPHINE SULFATE (PF) 4 MG/ML IV SOLN
INTRAVENOUS | Status: DC | PRN
  Administered 2016-10-01: 8 mg via INTRAMUSCULAR

## 2016-10-01 MED ORDER — PROPOFOL 500 MG/50ML IV EMUL
INTRAVENOUS | Status: DC | PRN
  Administered 2016-10-01: 75 ug/kg/min via INTRAVENOUS

## 2016-10-01 MED ORDER — LACTATED RINGERS IV SOLN
INTRAVENOUS | Status: DC
  Administered 2016-10-01 (×4): via INTRAVENOUS

## 2016-10-01 MED ORDER — MENTHOL 3 MG MT LOZG: 1.0000 | LOZENGE | OROMUCOSAL | Status: DC | PRN

## 2016-10-01 MED ORDER — PANTOPRAZOLE SODIUM 20 MG PO TBEC
20.0000 mg | DELAYED_RELEASE_TABLET | Freq: Every day | ORAL | Status: DC
  Administered 2016-10-02 – 2016-10-04 (×3): 20 mg via ORAL
  Filled 2016-10-01 (×4): qty 1

## 2016-10-01 MED ORDER — MIDAZOLAM HCL 5 MG/5ML IJ SOLN
INTRAMUSCULAR | Status: DC | PRN
  Administered 2016-10-01: 2 mg via INTRAVENOUS

## 2016-10-01 MED ORDER — DOCUSATE SODIUM 100 MG PO CAPS
100.0000 mg | ORAL_CAPSULE | Freq: Two times a day (BID) | ORAL | Status: DC
  Administered 2016-10-01 – 2016-10-04 (×6): 100 mg via ORAL
  Filled 2016-10-01 (×6): qty 1

## 2016-10-01 MED ORDER — FENTANYL CITRATE (PF) 100 MCG/2ML IJ SOLN
INTRAMUSCULAR | Status: AC
  Filled 2016-10-01: qty 4

## 2016-10-01 MED ORDER — CHLORHEXIDINE GLUCONATE 4 % EX LIQD: 60.0000 mL | Freq: Once | CUTANEOUS | Status: DC

## 2016-10-01 MED ORDER — HYDROMORPHONE HCL 1 MG/ML IJ SOLN: 0.2500 mg | INTRAMUSCULAR | Status: DC | PRN

## 2016-10-01 MED ORDER — RIVAROXABAN 10 MG PO TABS
10.0000 mg | ORAL_TABLET | Freq: Every day | ORAL | Status: DC
  Administered 2016-10-02 – 2016-10-04 (×3): 10 mg via ORAL
  Filled 2016-10-01 (×3): qty 1

## 2016-10-01 MED ORDER — MORPHINE SULFATE (PF) 4 MG/ML IV SOLN
INTRAVENOUS | Status: AC
  Filled 2016-10-01: qty 2

## 2016-10-01 MED ORDER — OXYCODONE HCL 5 MG/5ML PO SOLN: 5.0000 mg | Freq: Once | ORAL | Status: DC | PRN

## 2016-10-01 MED ORDER — CLONIDINE HCL (ANALGESIA) 100 MCG/ML EP SOLN
150.0000 ug | Freq: Once | EPIDURAL | Status: AC
  Administered 2016-10-01: 1 mL via INTRA_ARTICULAR
  Filled 2016-10-01: qty 1.5

## 2016-10-01 MED ORDER — LIDOCAINE 2% (20 MG/ML) 5 ML SYRINGE
INTRAMUSCULAR | Status: AC
  Filled 2016-10-01: qty 5

## 2016-10-01 MED ORDER — ACETAMINOPHEN 650 MG RE SUPP: 650.0000 mg | Freq: Four times a day (QID) | RECTAL | Status: DC | PRN

## 2016-10-01 MED ORDER — BUPIVACAINE LIPOSOME 1.3 % IJ SUSP
20.0000 mL | Freq: Once | INTRAMUSCULAR | Status: AC
  Administered 2016-10-01: 20 mL
  Filled 2016-10-01: qty 20

## 2016-10-01 MED ORDER — METOCLOPRAMIDE HCL 5 MG/ML IJ SOLN: 5.0000 mg | Freq: Three times a day (TID) | INTRAMUSCULAR | Status: DC | PRN

## 2016-10-01 MED ORDER — 0.9 % SODIUM CHLORIDE (POUR BTL) OPTIME
TOPICAL | Status: DC | PRN
  Administered 2016-10-01 (×2): 1000 mL

## 2016-10-01 MED ORDER — SUGAMMADEX SODIUM 200 MG/2ML IV SOLN
INTRAVENOUS | Status: AC
  Filled 2016-10-01: qty 2

## 2016-10-01 MED ORDER — MONTELUKAST SODIUM 10 MG PO TABS
10.0000 mg | ORAL_TABLET | Freq: Every day | ORAL | Status: DC
Start: 2016-10-01 — End: 2016-10-04
  Administered 2016-10-01 – 2016-10-03 (×3): 10 mg via ORAL
  Filled 2016-10-01 (×3): qty 1

## 2016-10-01 MED ORDER — SODIUM CHLORIDE 0.9 % IV SOLN
2000.0000 mg | Freq: Once | INTRAVENOUS | Status: AC
  Administered 2016-10-01: 2000 mg via TOPICAL
  Filled 2016-10-01: qty 20

## 2016-10-01 MED ORDER — DEXAMETHASONE SODIUM PHOSPHATE 10 MG/ML IJ SOLN
INTRAMUSCULAR | Status: AC
  Filled 2016-10-01: qty 2

## 2016-10-01 MED ORDER — MIDAZOLAM HCL 2 MG/2ML IJ SOLN
1.0000 mg | Freq: Once | INTRAMUSCULAR | Status: AC
  Administered 2016-10-01: 1 mg via INTRAVENOUS

## 2016-10-01 MED ORDER — PHENYLEPHRINE HCL 10 MG/ML IJ SOLN
INTRAMUSCULAR | Status: DC | PRN
  Administered 2016-10-01 (×3): 80 ug via INTRAVENOUS
  Administered 2016-10-01: 120 ug via INTRAVENOUS

## 2016-10-01 MED ORDER — PHENOL 1.4 % MT LIQD: 1.0000 | OROMUCOSAL | Status: DC | PRN

## 2016-10-01 MED ORDER — GABAPENTIN 300 MG PO CAPS
300.0000 mg | ORAL_CAPSULE | Freq: Three times a day (TID) | ORAL | Status: DC
  Administered 2016-10-01 – 2016-10-04 (×9): 300 mg via ORAL
  Filled 2016-10-01: qty 3
  Filled 2016-10-01 (×5): qty 1
  Filled 2016-10-01 (×2): qty 3
  Filled 2016-10-01: qty 1

## 2016-10-01 MED ORDER — METHOCARBAMOL 1000 MG/10ML IJ SOLN
500.0000 mg | Freq: Four times a day (QID) | INTRAVENOUS | Status: DC | PRN
  Filled 2016-10-01: qty 5

## 2016-10-01 MED ORDER — OXYCODONE HCL 5 MG PO TABS
5.0000 mg | ORAL_TABLET | ORAL | Status: DC | PRN
  Administered 2016-10-01 – 2016-10-04 (×15): 10 mg via ORAL
  Filled 2016-10-01 (×15): qty 2

## 2016-10-01 MED ORDER — ACETAMINOPHEN 325 MG PO TABS
650.0000 mg | ORAL_TABLET | Freq: Four times a day (QID) | ORAL | Status: DC | PRN
  Administered 2016-10-04: 650 mg via ORAL
  Filled 2016-10-01: qty 2

## 2016-10-01 MED ORDER — ATORVASTATIN CALCIUM 10 MG PO TABS
10.0000 mg | ORAL_TABLET | Freq: Every day | ORAL | Status: DC
  Administered 2016-10-01 – 2016-10-03 (×3): 10 mg via ORAL
  Filled 2016-10-01 (×3): qty 1

## 2016-10-01 MED ORDER — OXYCODONE HCL 5 MG PO TABS: 5.0000 mg | ORAL_TABLET | Freq: Once | ORAL | Status: DC | PRN

## 2016-10-01 MED ORDER — ALBUTEROL SULFATE (2.5 MG/3ML) 0.083% IN NEBU: 2.5000 mg | INHALATION_SOLUTION | Freq: Four times a day (QID) | RESPIRATORY_TRACT | Status: DC | PRN

## 2016-10-01 MED ORDER — ONDANSETRON HCL 4 MG/2ML IJ SOLN
INTRAMUSCULAR | Status: AC
  Filled 2016-10-01: qty 4

## 2016-10-01 MED ORDER — METHOCARBAMOL 500 MG PO TABS
500.0000 mg | ORAL_TABLET | Freq: Four times a day (QID) | ORAL | Status: DC | PRN
  Administered 2016-10-01 – 2016-10-04 (×8): 500 mg via ORAL
  Filled 2016-10-01 (×8): qty 1

## 2016-10-01 MED ORDER — SUCCINYLCHOLINE CHLORIDE 200 MG/10ML IV SOSY
PREFILLED_SYRINGE | INTRAVENOUS | Status: AC
  Filled 2016-10-01: qty 10

## 2016-10-01 MED ORDER — BUPIVACAINE HCL (PF) 0.25 % IJ SOLN
INTRAMUSCULAR | Status: DC | PRN
  Administered 2016-10-01: 20 mL

## 2016-10-01 MED ORDER — BUPIVACAINE HCL (PF) 0.5 % IJ SOLN
INTRAMUSCULAR | Status: AC
  Filled 2016-10-01: qty 30

## 2016-10-01 MED ORDER — BUPIVACAINE HCL (PF) 0.5 % IJ SOLN
INTRAMUSCULAR | Status: DC | PRN
  Administered 2016-10-01: 10 mL
  Administered 2016-10-01: 20 mL

## 2016-10-01 MED ORDER — ROPIVACAINE HCL 7.5 MG/ML IJ SOLN
INTRAMUSCULAR | Status: DC | PRN
  Administered 2016-10-01: 20 mL via PERINEURAL

## 2016-10-01 SURGICAL SUPPLY — 73 items
BANDAGE ACE 4X5 VEL STRL LF (GAUZE/BANDAGES/DRESSINGS) ×3 IMPLANT
BANDAGE ESMARK 6X9 LF (GAUZE/BANDAGES/DRESSINGS) ×1 IMPLANT
BLADE SAG 18X100X1.27 (BLADE) ×3 IMPLANT
BLADE SAW SGTL 13.0X1.19X90.0M (BLADE) IMPLANT
BNDG CMPR 9X6 STRL LF SNTH (GAUZE/BANDAGES/DRESSINGS) ×1
BNDG CMPR MED 15X6 ELC VLCR LF (GAUZE/BANDAGES/DRESSINGS) ×1
BNDG COHESIVE 6X5 TAN STRL LF (GAUZE/BANDAGES/DRESSINGS) ×3 IMPLANT
BNDG ELASTIC 6X15 VLCR STRL LF (GAUZE/BANDAGES/DRESSINGS) ×5 IMPLANT
BNDG ESMARK 6X9 LF (GAUZE/BANDAGES/DRESSINGS) ×3
BOWL SMART MIX CTS (DISPOSABLE) IMPLANT
CAPT KNEE TRIATH TK-4 ×3 IMPLANT
CLOSURE WOUND 1/2 X4 (GAUZE/BANDAGES/DRESSINGS) ×1
COVER SURGICAL LIGHT HANDLE (MISCELLANEOUS) ×3 IMPLANT
CUFF TOURNIQUET SINGLE 34IN LL (TOURNIQUET CUFF) ×3 IMPLANT
CUFF TOURNIQUET SINGLE 44IN (TOURNIQUET CUFF) IMPLANT
DECANTER SPIKE VIAL GLASS SM (MISCELLANEOUS) ×3 IMPLANT
DRAPE INCISE IOBAN 66X45 STRL (DRAPES) IMPLANT
DRAPE ORTHO SPLIT 77X108 STRL (DRAPES) ×9
DRAPE SURG ORHT 6 SPLT 77X108 (DRAPES) ×3 IMPLANT
DRAPE U-SHAPE 47X51 STRL (DRAPES) ×3 IMPLANT
DRSG AQUACEL AG ADV 3.5X10 (GAUZE/BANDAGES/DRESSINGS) ×3 IMPLANT
DRSG AQUACEL AG ADV 3.5X14 (GAUZE/BANDAGES/DRESSINGS) ×3 IMPLANT
DRSG PAD ABDOMINAL 8X10 ST (GAUZE/BANDAGES/DRESSINGS) ×6 IMPLANT
DURAPREP 26ML APPLICATOR (WOUND CARE) ×6 IMPLANT
ELECT CAUTERY BLADE 6.4 (BLADE) ×3 IMPLANT
ELECT REM PT RETURN 9FT ADLT (ELECTROSURGICAL) ×3
ELECTRODE REM PT RTRN 9FT ADLT (ELECTROSURGICAL) ×1 IMPLANT
GAUZE SPONGE 4X4 12PLY STRL (GAUZE/BANDAGES/DRESSINGS) ×3 IMPLANT
GLOVE BIOGEL PI IND STRL 7.5 (GLOVE) ×1 IMPLANT
GLOVE BIOGEL PI IND STRL 8 (GLOVE) ×1 IMPLANT
GLOVE BIOGEL PI INDICATOR 7.5 (GLOVE) ×2
GLOVE BIOGEL PI INDICATOR 8 (GLOVE) ×2
GLOVE ECLIPSE 7.0 STRL STRAW (GLOVE) ×3 IMPLANT
GLOVE SURG ORTHO 8.0 STRL STRW (GLOVE) ×3 IMPLANT
GOWN STRL REUS W/ TWL LRG LVL3 (GOWN DISPOSABLE) ×3 IMPLANT
GOWN STRL REUS W/TWL LRG LVL3 (GOWN DISPOSABLE) ×9
HANDPIECE INTERPULSE COAX TIP (DISPOSABLE)
HOOD PEEL AWAY FLYTE STAYCOOL (MISCELLANEOUS) ×9 IMPLANT
IMMOBILIZER KNEE 20 (SOFTGOODS) IMPLANT
IMMOBILIZER KNEE 22 UNIV (SOFTGOODS) ×3 IMPLANT
IMMOBILIZER KNEE 24 THIGH 36 (MISCELLANEOUS) IMPLANT
IMMOBILIZER KNEE 24 UNIV (MISCELLANEOUS)
KIT BASIN OR (CUSTOM PROCEDURE TRAY) ×3 IMPLANT
KIT ROOM TURNOVER OR (KITS) ×3 IMPLANT
MANIFOLD NEPTUNE II (INSTRUMENTS) ×3 IMPLANT
NDL SAFETY ECLIPSE 18X1.5 (NEEDLE) ×1 IMPLANT
NEEDLE HYPO 18GX1.5 SHARP (NEEDLE) ×3
NEEDLE HYPO 22GX1.5 SAFETY (NEEDLE) ×3 IMPLANT
NEEDLE SPNL 18GX3.5 QUINCKE PK (NEEDLE) ×3 IMPLANT
NS IRRIG 1000ML POUR BTL (IV SOLUTION) ×6 IMPLANT
PACK TOTAL JOINT (CUSTOM PROCEDURE TRAY) ×3 IMPLANT
PAD ARMBOARD 7.5X6 YLW CONV (MISCELLANEOUS) ×6 IMPLANT
PAD CAST 4YDX4 CTTN HI CHSV (CAST SUPPLIES) ×1 IMPLANT
PADDING CAST COTTON 4X4 STRL (CAST SUPPLIES) ×3
PADDING CAST COTTON 6X4 STRL (CAST SUPPLIES) ×3 IMPLANT
SET HNDPC FAN SPRY TIP SCT (DISPOSABLE) IMPLANT
SPONGE LAP 18X18 X RAY DECT (DISPOSABLE) ×2 IMPLANT
STRIP CLOSURE SKIN 1/2X4 (GAUZE/BANDAGES/DRESSINGS) ×3 IMPLANT
SUCTION FRAZIER HANDLE 10FR (MISCELLANEOUS) ×2
SUCTION TUBE FRAZIER 10FR DISP (MISCELLANEOUS) ×1 IMPLANT
SUT MNCRL AB 3-0 PS2 18 (SUTURE) ×3 IMPLANT
SUT VIC AB 0 CT1 27 (SUTURE) ×9
SUT VIC AB 0 CT1 27XBRD ANBCTR (SUTURE) ×3 IMPLANT
SUT VIC AB 1 CT1 27 (SUTURE) ×21
SUT VIC AB 1 CT1 27XBRD ANBCTR (SUTURE) ×5 IMPLANT
SUT VIC AB 2-0 CT1 27 (SUTURE) ×12
SUT VIC AB 2-0 CT1 TAPERPNT 27 (SUTURE) ×4 IMPLANT
SYR 30ML LL (SYRINGE) ×9 IMPLANT
SYR TB 1ML LUER SLIP (SYRINGE) ×3 IMPLANT
TOWEL OR 17X24 6PK STRL BLUE (TOWEL DISPOSABLE) ×6 IMPLANT
TOWEL OR 17X26 10 PK STRL BLUE (TOWEL DISPOSABLE) ×6 IMPLANT
TRAY CATH 16FR W/PLASTIC CATH (SET/KITS/TRAYS/PACK) IMPLANT
WATER STERILE IRR 1000ML POUR (IV SOLUTION) IMPLANT

## 2016-10-01 NOTE — Anesthesia Procedure Notes (Signed)
Anesthesia Regional Block:  Adductor canal block  Pre-Anesthetic Checklist: ,, timeout performed, Correct Patient, Correct Site, Correct Laterality, Correct Procedure, Correct Position, site marked, Risks and benefits discussed,  Surgical consent,  Pre-op evaluation,  At surgeon's request and post-op pain management  Laterality: Left  Prep: chloraprep       Needles:  Injection technique: Single-shot  Needle Type: Echogenic Needle     Needle Length: 9cm 9 cm Needle Gauge: 21 and 21 G    Additional Needles:  Procedures: ultrasound guided (picture in chart) Adductor canal block Narrative:  Start time: 10/01/2016 10:31 AM End time: 10/01/2016 10:41 AM Injection made incrementally with aspirations every 5 mL.  Performed by: Personally  Anesthesiologist: Tonjia Parillo  Additional Notes: Pt tolerated the procedure well.

## 2016-10-01 NOTE — Progress Notes (Signed)
Orthopedic Tech Progress Note Patient Details:  Angel Hebert April 13, 1956 [id number]  Ortho Devices Ortho Device/Splint Location: Bone Foam zero degree knee provided and placed at bedside.  Overhead Frame applied.  Knee immobilizer already on leg at this time.    Kristopher Oppenheim 10/01/2016, 5:35 PM

## 2016-10-01 NOTE — Anesthesia Preprocedure Evaluation (Signed)
Anesthesia Evaluation  Patient identified by MRN, date of birth, ID band Patient awake    Reviewed: Allergy & Precautions, H&P , NPO status , Patient's Chart, lab work & pertinent test results  Airway Mallampati: II   Neck ROM: full    Dental   Pulmonary asthma ,    breath sounds clear to auscultation       Cardiovascular hypertension,  Rhythm:regular Rate:Normal     Neuro/Psych    GI/Hepatic GERD  ,  Endo/Other    Renal/GU      Musculoskeletal  (+) Arthritis ,   Abdominal   Peds  Hematology   Anesthesia Other Findings   Reproductive/Obstetrics                             Anesthesia Physical Anesthesia Plan  ASA: II  Anesthesia Plan: General   Post-op Pain Management:  Regional for Post-op pain   Induction: Intravenous  Airway Management Planned: Oral ETT  Additional Equipment:   Intra-op Plan:   Post-operative Plan: Extubation in OR  Informed Consent: I have reviewed the patients History and Physical, chart, labs and discussed the procedure including the risks, benefits and alternatives for the proposed anesthesia with the patient or authorized representative who has indicated his/her understanding and acceptance.     Plan Discussed with: CRNA, Anesthesiologist and Surgeon  Anesthesia Plan Comments:         Anesthesia Quick Evaluation

## 2016-10-01 NOTE — Transfer of Care (Signed)
Immediate Anesthesia Transfer of Care Note  Patient: Angel Hebert  Procedure(s) Performed: Procedure(s): LEFT TOTAL KNEE ARTHROPLASTY (Left)  Patient Location: PACU  Anesthesia Type:Spinal  Level of Consciousness: awake, alert , oriented and patient cooperative  Airway & Oxygen Therapy: Patient Spontanous Breathing and Patient connected to nasal cannula oxygen  Post-op Assessment: Report given to RN and Post -op Vital signs reviewed and stable  Post vital signs: Reviewed and stable  Last Vitals:  Vitals:   10/01/16 0915  BP: 131/73  Pulse: 82  Resp: 20  Temp: 36.7 C    Last Pain:  Vitals:   10/01/16 0915  TempSrc: Oral      Patients Stated Pain Goal: 6 ([id number])  Complications: No apparent anesthesia complications

## 2016-10-01 NOTE — Brief Op Note (Signed)
10/01/2016  2:49 PM  PATIENT:  Angel Hebert  61 y.o. female  PRE-OPERATIVE DIAGNOSIS:  LEFT KNEE OSTEOARTHRITIS  POST-OPERATIVE DIAGNOSIS:  LEFT KNEE OSTEOARTHRITIS  PROCEDURE:  Procedure(s): LEFT TOTAL KNEE ARTHROPLASTY  SURGEON:  Surgeon(s): Meredith Pel, MD  ASSISTANT: Laure Kidney rnfa  ANESTHESIA:   general  EBL: 75 ml    Total I/O In: 1000 [I.V.:1000] Out: [id number] [Urine:1500; Blood:80]  BLOOD ADMINISTERED: none  DRAINS: none   LOCAL MEDICATIONS USED:  Marcaine mso4 clonidine exparel  SPECIMEN:  No Specimen  COUNTS:  YES  TOURNIQUET:   Total Tourniquet Time Documented: Thigh (Left) - 87 minutes Total: Thigh (Left) - 87 minutes   DICTATION: .Other Dictation: Dictation Number [id number]  PLAN OF CARE: Admit to inpatient   PATIENT DISPOSITION:  PACU - hemodynamically stable

## 2016-10-01 NOTE — H&P (Signed)
TOTAL KNEE ADMISSION H&P  Patient is being admitted for left total knee arthroplasty.  Subjective:  Chief Complaint:left knee pain.  HPI: Angel Hebert, 60 y.o. female, has a history of pain and functional disability in the left knee due to arthritis and has failed non-surgical conservative treatments for greater than 12 weeks to includeNSAID's and/or analgesics, corticosteriod injections, flexibility and strengthening excercises, use of assistive devices and activity modification.  Onset of symptoms was gradual, starting 8 years ago with gradually worsening course since that time. The patient noted no past surgery on the left knee(s).  Patient currently rates pain in the left knee(s) at 10 out of 10 with activity. Patient has night pain, worsening of pain with activity and weight bearing, pain that interferes with activities of daily living, pain with passive range of motion, crepitus and joint swelling.  Patient has evidence of subchondral sclerosis and joint space narrowing by imaging studies. This patient has had pain for a long time but it has increased recently to the point where it's become somewhat unbearable.. There is no active infection.  Patient has no personal or family history of DVT or pulmonary embolism  Patient Active Problem List   Diagnosis Date Noted  . Primary localized osteoarthritis of knee 12/13/2015  . Benign essential HTN   . Physical deconditioning   . Anemia, iron deficiency   . Pressure ulcer 10/14/2015  . CAP (community acquired pneumonia)   . Pleural effusion   . Sepsis ([place])   . Sepsis due to Streptococcus pneumoniae ([place])   . Bacteremia   . Community acquired pneumonia 10/08/2015  . Acute sinusitis 12/02/2014  . Sinusitis 03/17/2013  . Hyperlipidemia 03/02/2007  . HYPERTENSION 03/02/2007  . Allergic rhinitis 03/02/2007  . Asthma 03/02/2007  . GERD 03/02/2007   Past Medical History:  Diagnosis Date  . Arthritis   . Asthma    last year in  December  . Dyspnea   . GERD (gastroesophageal reflux disease)   . High cholesterol   . Hypertension     Past Surgical History:  Procedure Laterality Date  . ABDOMINAL HYSTERECTOMY    . nasal polyps     removed just this past tuesday at [facility] facility [facility]    Prescriptions Prior to Admission  Medication Sig Dispense Refill Last Dose  . albuterol (PROVENTIL HFA;VENTOLIN HFA) 108 (90 Base) MCG/ACT inhaler Inhale 1-2 puffs into the lungs every 6 (six) hours as needed for wheezing or shortness of breath. 1 Inhaler 2 Taking  . albuterol (PROVENTIL) (2.5 MG/3ML) 0.083% nebulizer solution Take 3 mLs (2.5 mg total) by nebulization every 6 (six) hours as needed for wheezing or shortness of breath. 150 mL 1 Taking  . atorvastatin (LIPITOR) 10 MG tablet TAKE 1 TABLET BY MOUTH DAILY AT 6 PM FOR CHOLESTEROL (Patient taking differently: Take 10 mg by mouth daily. FOR CHOLESTEROL) 30 tablet 5 Taking  . cetirizine (ZYRTEC) 10 MG tablet Take 1 tablet (10 mg total) by mouth daily. 30 tablet 5 Taking  . Fluticasone-Salmeterol (ADVAIR) 500-50 MCG/DOSE AEPB Inhale 1 puff into the lungs every 12 (twelve) hours. (Patient taking differently: Inhale 1 puff into the lungs daily as needed (shortness of breath). ) 60 each 5 Taking  . hydrochlorothiazide (HYDRODIURIL) 25 MG tablet TAKE 1/2 TABLET BY MOUTH DAILY FOR BLOOD PRESSURE (Patient taking differently: Take 12.5 mg by mouth daily. ) 30 tablet 5 Taking  . HYDROcodone-acetaminophen (NORCO/VICODIN) 5-325 MG tablet Take 1 tablet by mouth daily. 30 tablet 0 Taking  .  irbesartan (AVAPRO) 150 MG tablet TAKE 1 TABLET BY MOUTH DAILY AFTER BREAKFAST FOR BLOOD PRESSURE (Patient taking differently: Take 150 mg by mouth daily. AFTER BREAKFAST FOR BLOOD PRESSURE) 30 tablet 5 Taking  . montelukast (SINGULAIR) 10 MG tablet Take 1 tablet (10 mg total) by mouth at bedtime. 30 tablet 5 Taking  . naproxen (NAPROSYN) 500 MG tablet Take 1 tablet (500 mg total) by mouth 2  (two) times daily. 60 tablet 2   . pantoprazole (PROTONIX) 20 MG tablet Take 1 tablet (20 mg total) by mouth daily. 30 tablet 5 Taking  . traZODone (DESYREL) 50 MG tablet Take 1 tablet (50 mg total) by mouth at bedtime. 30 tablet 5 Taking   Allergies  Allergen Reactions  . No Known Allergies     Social History  Substance Use Topics  . Smoking status: Never Smoker  . Smokeless tobacco: Never Used  . Alcohol use No    Family History  Problem Relation Age of Onset  . Colon cancer Neg Hx      Review of Systems  Musculoskeletal: Positive for joint pain.  All other systems reviewed and are negative.   Objective:  Physical Exam  Constitutional: She appears well-developed.  HENT:  Head: Normocephalic.  Eyes: Pupils are equal, round, and reactive to light.  Cardiovascular: Normal rate.   Respiratory: Effort normal.  Neurological: She is alert.  Skin: Skin is warm.  Psychiatric: She has a normal mood and affect.   examination the left knee demonstrates palpable pedal pulses she has good flexion easily past 90 about a 5 flexion contracture medial and lateral joint line tenderness extensor mechanism is intact no other masses lymph adenopathy or skin changes noted in the left knee region.  Vital signs in last 24 hours: Temp:  [98.1 F (36.7 C)] 98.1 F (36.7 C) (01/30 0915) Pulse Rate:  [82] 82 (01/30 0915) Resp:  [20] 20 (01/30 0915) BP: (131)/(73) 131/73 (01/30 0915) SpO2:  [100 %] 100 % (01/30 0915) Weight:  [171 lb (77.6 kg)] 171 lb (77.6 kg) (01/30 0915)  Labs:   Estimated body mass index is 26.78 kg/m as calculated from the following:   Height as of 09/23/16: 5\' 7"  (1.702 m).   Weight as of this encounter: 171 lb (77.6 kg).   Imaging Review Plain radiographs demonstrate severe degenerative joint disease of the left knee(s). The overall alignment ismild varus. The bone quality appears to be good for age and reported activity level.  Assessment/Plan:  End stage  arthritis, left knee   The patient history, physical examination, clinical judgment of the provider and imaging studies are consistent with end stage degenerative joint disease of the left knee(s) and total knee arthroplasty is deemed medically necessary. The treatment options including medical management, injection therapy arthroscopy and arthroplasty were discussed at length. The risks and benefits of total knee arthroplasty were presented and reviewed. The risks due to aseptic loosening, infection, stiffness, patella tracking problems, thromboembolic complications and other imponderables were discussed. The patient acknowledged the explanation, agreed to proceed with the plan and consent was signed. Patient is being admitted for inpatient treatment for surgery, pain control, PT, OT, prophylactic antibiotics, VTE prophylaxis, progressive ambulation and ADL's and discharge planning. The patient is planning to be discharged to skilled nursing facility

## 2016-10-01 NOTE — Anesthesia Postprocedure Evaluation (Signed)
Anesthesia Post Note  Patient: Angel Hebert  Procedure(s) Performed: Procedure(s) (LRB): LEFT TOTAL KNEE ARTHROPLASTY (Left)  Patient location during evaluation: PACU Anesthesia Type: Spinal Level of consciousness: awake and alert Pain management: pain level controlled Vital Signs Assessment: post-procedure vital signs reviewed and stable Respiratory status: spontaneous breathing and respiratory function stable Cardiovascular status: blood pressure returned to baseline and stable Postop Assessment: spinal receding Anesthetic complications: no       Last Vitals:  Vitals:   10/01/16 1545 10/01/16 1600  BP:    Pulse: 67 65  Resp: 13 12  Temp:      Last Pain:  Vitals:   10/01/16 1600  TempSrc:   PainSc: 0-No pain                 Tiajuana Amass

## 2016-10-01 NOTE — Anesthesia Procedure Notes (Signed)
Spinal  Patient location during procedure: OR Start time: 10/01/2016 11:49 AM End time: 10/01/2016 11:53 AM Staffing Anesthesiologist: Marcie Bal, ADAM Performed: anesthesiologist  Preanesthetic Checklist Completed: patient identified, site marked, surgical consent, pre-op evaluation, timeout performed, IV checked, risks and benefits discussed and monitors and equipment checked Spinal Block Patient position: sitting Prep: Betadine Patient monitoring: heart rate, cardiac monitor, continuous pulse ox and blood pressure Approach: midline Location: L3-4 Injection technique: single-shot Needle Needle type: Pencan  Needle gauge: 24 G Needle length: 9 cm Assessment Sensory level: T8 Additional Notes Pt tolerated the procedure well.

## 2016-10-02 ENCOUNTER — Encounter (HOSPITAL_COMMUNITY): Payer: Self-pay | Admitting: Orthopedic Surgery

## 2016-10-02 NOTE — Evaluation (Signed)
Physical Therapy Evaluation Patient Details Name: Angel Hebert MRN: [id number] DOB: 08-15-1956 Today's Date: 10/02/2016   History of Present Illness  61 yo female with h/o GERD, HTN, CAP, bacteremia, asthma who presents for left TKA after failing conservative management of left knee OA  Clinical Impression  Patient is s/p above surgery resulting in functional limitations due to the deficits listed below (see PT Problem List). Pt with 8/10 left knee pain with mobility but tolerated 25' ambulation with min A and RW. Pt left in zero knee foam which was very uncomfortable for her but she was working towards staying in 15 mins.   Patient will benefit from skilled PT to increase their independence and safety with mobility to allow discharge to the venue listed below.       Follow Up Recommendations Home health PT;Supervision for mobility/OOB    Equipment Recommendations  Rolling walker with 5" wheels;3in1 (PT)    Recommendations for Other Services       Precautions / Restrictions Precautions Precautions: Knee Precaution Booklet Issued: No Precaution Comments: reviewed proper positioning and use of CPM and zero knee foam Required Braces or Orthoses: Knee Immobilizer - Left Restrictions Weight Bearing Restrictions: Yes LLE Weight Bearing: Weight bearing as tolerated      Mobility  Bed Mobility Overal bed mobility: Needs Assistance Bed Mobility: Supine to Sit     Supine to sit: Min guard     General bed mobility comments: pt able to sit straight up in bed and use UE's to move LLE to EOB, vc's for sequencing and discussed rolling and sitting up if she is inable to sit straight up at home  Transfers Overall transfer level: Needs assistance Equipment used: Rolling walker (2 wheeled) Transfers: Sit to/from Stand Sit to Stand: Min guard         General transfer comment: vc's for hand placement  Ambulation/Gait Ambulation/Gait assistance: Min assist Ambulation Distance  (Feet): 25 Feet Assistive device: Rolling walker (2 wheeled) Gait Pattern/deviations: Step-through pattern;Decreased weight shift to left;Decreased stance time - left;Decreased step length - left Gait velocity: decreased Gait velocity interpretation: Below normal speed for age/gender General Gait Details: vc's for sequencing and trying to get left heel to floor  Stairs            Wheelchair Mobility    Modified Rankin (Stroke Patients Only)       Balance Overall balance assessment: Needs assistance Sitting-balance support: No upper extremity supported Sitting balance-Leahy Scale: Normal     Standing balance support: Single extremity supported Standing balance-Leahy Scale: Fair Standing balance comment: relies on RW for stability                             Pertinent Vitals/Pain Pain Assessment: 0-10 Pain Score: 8  Pain Location: left knee Pain Descriptors / Indicators: Aching;Operative site guarding Pain Intervention(s): Limited activity within patient's tolerance;Monitored during session;Premedicated before session;Repositioned    Home Living Family/patient expects to be discharged to:: Private residence Living Arrangements: Non-relatives/Friends Available Help at Discharge: Friend(s);Available 24 hours/day Type of Home: House Home Access: Ramped entrance     Home Layout: One level Home Equipment: None Additional Comments: pt lives with boyfriend who works and friend names Glendell Docker who does not work and is at the house all of the time    Prior Function Level of Independence: Independent               Journalist, newspaper  Extremity/Trunk Assessment   Upper Extremity Assessment Upper Extremity Assessment: Overall WFL for tasks assessed    Lower Extremity Assessment Lower Extremity Assessment: LLE deficits/detail LLE Deficits / Details: hip flex 2+/5, knee ext 2+/5, knee flex 3/5, ankle WFL    Cervical / Trunk Assessment Cervical /  Trunk Assessment: Normal  Communication   Communication: No difficulties  Cognition Arousal/Alertness: Awake/alert Behavior During Therapy: WFL for tasks assessed/performed Overall Cognitive Status: Within Functional Limits for tasks assessed                      General Comments General comments (skin integrity, edema, etc.): O2 sats 96% on RA, HR 93 bpm    Exercises Total Joint Exercises Ankle Circles/Pumps: AROM;Both;20 reps;Seated Quad Sets: AROM;Both;10 reps;Seated Long Arc Quad: AROM;Left;10 reps;Seated Goniometric ROM: 10-85   Assessment/Plan    PT Assessment Patient needs continued PT services  PT Problem List Decreased strength;Decreased range of motion;Decreased activity tolerance;Decreased balance;Decreased mobility;Decreased knowledge of use of DME;Decreased knowledge of precautions;Pain          PT Treatment Interventions DME instruction;Gait training;Functional mobility training;Therapeutic activities;Therapeutic exercise;Balance training;Patient/family education    PT Goals (Current goals can be found in the Care Plan section)  Acute Rehab PT Goals Patient Stated Goal: return home PT Goal Formulation: With patient/family Time For Goal Achievement: 10/09/16 Potential to Achieve Goals: Good    Frequency 7X/week   Barriers to discharge        Co-evaluation               End of Session Equipment Utilized During Treatment: Gait belt Activity Tolerance: Patient tolerated treatment well Patient left: in chair;with call bell/phone within reach;with chair alarm set;with family/visitor present Nurse Communication: Mobility status         Time: [id number] PT Time Calculation (min) (ACUTE ONLY): 25 min   Charges:   PT Evaluation $PT Eval Low Complexity: 1 Procedure PT Treatments $Gait Training: 8-22 mins   PT G Codes:      Leighton Roach, PT  Acute Rehab Services  [place] 10/02/2016, 9:50 AM

## 2016-10-02 NOTE — Progress Notes (Signed)
Subjective: Patient stable.  Pain reasonably well-controlled on Percocet and IV morphine.  Patient is tolerating CPM machine well   Objective: Vital signs in last 24 hours: Temp:  [97 F (36.1 C)-98.6 F (37 C)] 98.6 F (37 C) (01/31 0450) Pulse Rate:  [60-82] 82 (01/31 0045) Resp:  [10-20] 16 (01/31 0450) BP: (98-131)/(52-74) 113/68 (01/31 0450) SpO2:  [97 %-100 %] 97 % (01/31 0757) Weight:  [171 lb (77.6 kg)] 171 lb (77.6 kg) (01/30 0915)  Intake/Output from previous day: 01/30 0701 - 01/31 0700 In: 3631.3 [P.O.:320; I.V.:3101.3; IV Piggyback:210] Out: 2580 [Urine:2500; Blood:80] Intake/Output this shift: No intake/output data recorded.  Exam:  Dorsiflexion/Plantar flexion intact  Labs: No results for input(s): HGB in the last 72 hours. No results for input(s): WBC, RBC, HCT, PLT in the last 72 hours. No results for input(s): NA, K, CL, CO2, BUN, CREATININE, GLUCOSE, CALCIUM in the last 72 hours. No results for input(s): LABPT, INR in the last 72 hours.  Assessment/Plan: Plan physical therapy twice today.  I will remove Ace wrap tomorrow.  Patient may be able to be discharged home tomorrow versus Friday depending on her progress today.   Angel Hebert 10/02/2016, 8:49 AM

## 2016-10-02 NOTE — Progress Notes (Signed)
Physical Therapy Treatment Patient Details Name: Angel Hebert MRN: [id number] DOB: [birth year] Today's Date: 10/02/2016    History of Present Illness 61 yo female with h/o GERD, HTN, CAP, bacteremia, asthma who presents for left TKA after failing conservative management of left knee OA, now s/p L TKA    PT Comments    Pt progressing well with afternoon, ambulated 100' with RW and min-guard A. Worked on wt shifting to left with ambulation and getting heel to floor. Left in CPM -5-60 degrees. PT will continue to follow.   Follow Up Recommendations  Home health PT;Supervision for mobility/OOB     Equipment Recommendations  Rolling walker with 5" wheels;3in1 (PT)    Recommendations for Other Services       Precautions / Restrictions Precautions Precautions: Knee Precaution Booklet Issued: No Required Braces or Orthoses: Knee Immobilizer - Left Restrictions Weight Bearing Restrictions: Yes LLE Weight Bearing: Weight bearing as tolerated    Mobility  Bed Mobility Overal bed mobility: Needs Assistance (Pt up in chair upon OT arrival) Bed Mobility: Sit to Supine       Sit to supine: Min assist   General bed mobility comments: min A for LLE back into bed  Transfers Overall transfer level: Needs assistance Equipment used: Rolling walker (2 wheeled) Transfers: Sit to/from Stand Sit to Stand: Min guard         General transfer comment: min-guard from recliner, toielt, and bed. Needed reminding of proper hand placement  Ambulation/Gait Ambulation/Gait assistance: Min guard Ambulation Distance (Feet): 100 Feet Assistive device: Rolling walker (2 wheeled) Gait Pattern/deviations: Step-through pattern;Decreased weight shift to left;Decreased stance time - left;Decreased step length - left Gait velocity: decreased Gait velocity interpretation: Below normal speed for age/gender General Gait Details: pt tolerated increased distance this afternoon and had smoother gait  pattern with increased distance   Stairs            Wheelchair Mobility    Modified Rankin (Stroke Patients Only)       Balance Overall balance assessment: Needs assistance Sitting-balance support: No upper extremity supported Sitting balance-Leahy Scale: Normal     Standing balance support: Single extremity supported Standing balance-Leahy Scale: Fair Standing balance comment: relies on RW for stability                    Cognition Arousal/Alertness: Awake/alert Behavior During Therapy: WFL for tasks assessed/performed Overall Cognitive Status: Within Functional Limits for tasks assessed                      Exercises Total Joint Exercises Ankle Circles/Pumps: AROM;Both;20 reps;Seated Quad Sets: AROM;Both;10 reps;Seated Heel Slides: AROM;Left;10 reps;Supine Hip ABduction/ADduction: Left;AAROM;10 reps;Supine Straight Leg Raises: AAROM;Left;10 reps;Supine Long Arc Quad: AROM;Left;10 reps;Seated Knee Flexion: AROM;Left;10 reps;Seated Goniometric ROM: 10-80 Marching in Standing: AROM;Left;10 reps;Standing Standing Hip Extension: AROM;Left;10 reps;Standing    General Comments General comments (skin integrity, edema, etc.): pt has some concerns because the man she and her boyfriend live with owns the house and she says she has to leave whenever his brother comes, which is every Sunday. Discussed the possibility of rehab with her but she does not want to go to rehab      Pertinent Vitals/Pain Pain Assessment: Faces Faces Pain Scale: Hurts whole lot Pain Location: left knee Pain Descriptors / Indicators: Aching;Operative site guarding Pain Intervention(s): Limited activity within patient's tolerance;Monitored during session;Repositioned    Home Living  Prior Function            PT Goals (current goals can now be found in the care plan section) Acute Rehab PT Goals Patient Stated Goal: Decreased pain left knee PT  Goal Formulation: With patient/family Time For Goal Achievement: 10/09/16 Potential to Achieve Goals: Good Progress towards PT goals: Progressing toward goals    Frequency    7X/week      PT Plan Current plan remains appropriate    Co-evaluation             End of Session Equipment Utilized During Treatment: Gait belt Activity Tolerance: Patient tolerated treatment well Patient left: with call bell/phone within reach;with family/visitor present;in bed;in CPM     Time: 1357-1435 PT Time Calculation (min) (ACUTE ONLY): 38 min  Charges:  $Gait Training: 23-37 mins $Therapeutic Exercise: 8-22 mins                    G Codes:     Leighton Roach, PT  Acute Rehab Services  [place] 10/02/2016, 3:26 PM

## 2016-10-02 NOTE — Progress Notes (Signed)
Orthopedic Tech Progress Note Patient Details:  Angel Hebert 03/22/1956 [id number] Put on cpm at 1800 Patient ID: Angel Hebert, female   DOB: [birth year], 61 y.o.   MRN: [id number]   Braulio Bosch 10/02/2016, 6:01 PM

## 2016-10-02 NOTE — Op Note (Deleted)
  The note originally documented on this encounter has been moved the the encounter in which it belongs.  

## 2016-10-02 NOTE — Progress Notes (Signed)
Orthopedic Tech Progress Note Patient Details:  Angel Hebert 10/30/1955 [id number]  Patient ID: Angel Hebert, female   DOB: 02-May-1956, 61 y.o.   MRN: [id number]   [name] 10/02/2016, 7:49 AM Placed pt's lle on cpm @ 0-40 degrees @0750 ; RNnotified

## 2016-10-02 NOTE — Evaluation (Signed)
Occupational Therapy Evaluation Patient Details Name: Angel Hebert MRN: [id number] DOB: 01/17/1956 Today's Date: 10/02/2016    History of Present Illness 61 yo female with h/o GERD, HTN, CAP, bacteremia, asthma who presents for left TKA after failing conservative management of left knee OA, now s/p L TKA   Clinical Impression   Pt admitted as above, currently Min A LB ADL's/self care tasks. She should benefit from acute OT to assist with maximizing independence with ADL, functional moblity and transfers. She will have boyfriend/friends assist 24/7 at d/c per her report. She should benefit from 3:1 for home use. Will follow for OT, recommend tub transfers next session.    Follow Up Recommendations  No OT follow up;Supervision - Intermittent    Equipment Recommendations  3 in 1 bedside commode    Recommendations for Other Services       Precautions / Restrictions Precautions Precautions: Knee Precaution Booklet Issued: No Precaution Comments: reviewed proper positioning and use of CPM and zero knee foam Required Braces or Orthoses: Knee Immobilizer - Left Restrictions Weight Bearing Restrictions: Yes LLE Weight Bearing: Weight bearing as tolerated      Mobility Bed Mobility Overal bed mobility:  (Pt up in chair upon OT arrival) Bed Mobility: Supine to Sit     Supine to sit: Min guard     General bed mobility comments: pt able to sit straight up in bed and use UE's to move LLE to EOB, vc's for sequencing and discussed rolling and sitting up if she is inable to sit straight up at home  Transfers Overall transfer level: Needs assistance Equipment used: Rolling walker (2 wheeled) Transfers: Sit to/from [facility] Sit to Stand: Min guard Stand pivot transfers: Min guard       General transfer comment: vc's for hand placement    Balance Overall balance assessment: Needs assistance Sitting-balance support: No upper extremity supported Sitting  balance-Leahy Scale: Normal     Standing balance support: Single extremity supported Standing balance-Leahy Scale: Fair Standing balance comment: relies on RW for stability                            ADL Overall ADL's : Needs assistance/impaired Eating/Feeding: Independent;Sitting   Grooming: Wash/dry hands;Wash/dry face;Standing;Supervision/safety   Upper Body Bathing: Set up;Sitting   Lower Body Bathing: Minimal assistance;Sit to/from stand   Upper Body Dressing : Set up;Sitting   Lower Body Dressing: Minimal assistance;Sit to/from stand;Sitting/lateral leans   Toilet Transfer: Supervision/safety;Comfort height toilet;Grab bars;RW;Ambulation   Toileting- Clothing Manipulation and Hygiene: Min guard;Sit to/from stand       Functional mobility during ADLs: Supervision/safety;Min guard;Rolling walker General ADL Comments: Pt was assessed, educated in role of OT, followed by participation in ADL retraining session with focus on functional mobility, transfers and grooming standing at sink. Pt is currently Min A LB ADl's and self care tasks. She should benefit from acute OT to assist in maximizing independence with ADL, functional moblity and transfers. She will have boyfriend/friends assist 24/7 at d/c per her report. She should benefit from 3:1 for home use. Will follow for OT, rec tub transfers next session.     Vision  No change from baseline per pt report   Perception     Praxis      Pertinent Vitals/Pain Pain Assessment: 0-10 Pain Score: 10-Worst pain ever Pain Location: left knee Pain Descriptors / Indicators: Aching;Operative site guarding Pain Intervention(s): Limited activity within patient's tolerance;Monitored during session;Repositioned;Patient  requesting pain meds-RN notified;Ice applied     Hand Dominance Left   Extremity/Trunk Assessment Upper Extremity Assessment Upper Extremity Assessment: Overall WFL for tasks assessed   Lower Extremity  Assessment Lower Extremity Assessment: Defer to PT evaluation LLE Deficits / Details: hip flex 2+/5, knee ext 2+/5, knee flex 3/5, ankle WFL   Cervical / Trunk Assessment Cervical / Trunk Assessment: Normal   Communication Communication Communication: No difficulties   Cognition Arousal/Alertness: Awake/alert Behavior During Therapy: WFL for tasks assessed/performed Overall Cognitive Status: Within Functional Limits for tasks assessed                     General Comments       Exercises Exercises: Total Joint     Shoulder Instructions      Home Living Family/patient expects to be discharged to:: Private residence Living Arrangements: Non-relatives/Friends Available Help at Discharge: Friend(s);Available 24 hours/day Type of Home: House Home Access: Ramped entrance     Home Layout: One level     Bathroom Shower/Tub: Teacher, early years/pre: Standard     Home Equipment: None   Additional Comments: pt lives with boyfriend who works and friend named Glendell Docker who does not work and is at the house all of the time      Prior Functioning/Environment Level of Independence: Independent                 OT Problem List: Pain;Decreased knowledge of use of DME or AE;Decreased activity tolerance;Decreased knowledge of precautions   OT Treatment/Interventions: Self-care/ADL training;Therapeutic exercise;Patient/family education;Therapeutic activities;DME and/or AE instruction    OT Goals(Current goals can be found in the care plan section) Acute Rehab OT Goals Patient Stated Goal: Decreased pain left knee OT Goal Formulation: With patient Time For Goal Achievement: 10/09/16 Potential to Achieve Goals: Good  OT Frequency: Min 2X/week   Barriers to D/C:            Co-evaluation              End of Session Equipment Utilized During Treatment: Gait belt;Rolling walker;Left knee immobilizer Nurse Communication: Mobility status;Patient requests  pain meds  Activity Tolerance: Patient tolerated treatment well;No increased pain Patient left: in chair;with call bell/phone within reach;with chair alarm set;with nursing/sitter in room;with family/visitor present   Time: 0922-0947 OT Time Calculation (min): 25 min Charges:  OT General Charges $OT Visit: 1 Procedure OT Evaluation $OT Eval Low Complexity: 1 Procedure OT Treatments $Self Care/Home Management : 8-22 mins G-Codes:    Josephine Igo Dixon, OTR/L 10/02/2016, 10:00 AM

## 2016-10-02 NOTE — Op Note (Signed)
NAME:  Angel Hebert, Angel Hebert NO.:  [id number]  MEDICAL RECORD NO.:  [id number]  LOCATION:                               FACILITY:  [facility]  PHYSICIAN:  Anderson Malta, M.D.    DATE OF BIRTH:  April 16, 1956  DATE OF PROCEDURE: DATE OF DISCHARGE:                              OPERATIVE REPORT   PREOPERATIVE DIAGNOSIS:  Left knee arthritis.  POSTOPERATIVE DIAGNOSIS:  Left knee arthritis.  PROCEDURES PERFORMED:  Left total knee replacement with Stryker components, press-fit 3 femur, 3 tibia, 11-mm polyethylene insert, and 29-mm 3-peg press-fit patella.  SURGEON:  Anderson Malta, MD.  ASSISTANT:  Laure Kidney, RNFA.  INDICATIONS:  Angel Hebert is a patient with end-stage left knee arthritis, who presents for operative management after explanation of risks and benefits.  PROCEDURE IN DETAIL:  The patient was brought to the operating room, where spinal anesthetic was induced.  Preoperative antibiotics were administered.  Time-out was called.  The left leg was pre-scrubbed with alcohol and Betadine, allowed to air dry, prepped with DuraPrep solution, and draped in a sterile manner.  Charlie Pitter was used to cover the operative field.  The leg was elevated and exsanguinated with the Esmarch wrap.  Tourniquet was inflated.  A median parapatellar approach was made. This was marked with a #1 Vicryl suture upon everting the patella.  The fat pad was partially excised.  Soft tissue of the anterior distal femur was removed in a supraperiosteal fashion.  The lateral patellofemoral ligament was released.  Using intramedullary alignment, a 9 mm cut off the least affected lateral tibial plateau was made with the collaterals and PCL protected.  The PCL appeared robust and was maintained for posterior cruciate retaining prosthesis.  The cut was made.  Intramedullary alignment was then used to cut the distal femur 5 degrees of valgus, with a 10-mm cut due to preoperative flexion contracture of about 10  to 12 degrees.  Following this, a 9-mm spacer fit easily within the resected surfaces.  The femur was measured, and then the chamfer cuts, anterior-posterior cuts were made with the collaterals protected using a size 3 block. The tibia was then keel punched for press-fit implant.  Bone quality was good.  In a similar manner, the lug nuts were drilled for the femur.  Trial components were placed.  Patella was cut from 21 down to 12, and a 9-mm patellar trial was placed.  With trials in position including an 11-mm spacer, the patient achieved full extension, full flexion, excellent patellar tracking using no thumbs technique, and excellent stability to varus and valgus stress at 0, 30, and 90 degrees.  Implants were removed.  A thorough irrigation was performed with 3 liters of irrigating solution. Exparel was then injected into the capsule circumferentially around the knee.  Tranexamic acid was then utilized for 3 minutes inside the knee. This was then removed, and the true components were press fit into position with good press-fit achieved.  The 11-mm poly spacer was utilized.  This was not a __________ spacer because the integrity of the PCL was excellent.  Following this, the tourniquet was released. Bleeding points were encountered and controlled with electrocautery.  An additional 2 L of irrigating solution  was utilized, and the knee was closed using #1 Vicryl suture, 0 Vicryl suture, 2-0 Vicryl suture, and then a 3-0 Monocryl.  It should be noted that the skin edges were anesthetized with 30 mL of Marcaine-morphine-clonidine mixture.  Bulky wrap knee immobilizer was placed.  The patient tolerated the procedure well without immediate complications, and transferred to the recovery room in stable condition.     Anderson Malta, M.D.   ______________________________ Darnell Level. Alphonzo Severance, M.D.    GSD/MEDQ  D:  10/01/2016  T:  10/02/2016  Job:  [id number]

## 2016-10-03 ENCOUNTER — Encounter (HOSPITAL_COMMUNITY): Payer: Self-pay | Admitting: General Practice

## 2016-10-03 MED ORDER — RIVAROXABAN 10 MG PO TABS: 10.0000 mg | ORAL_TABLET | Freq: Every day | ORAL | 0 refills | Status: DC

## 2016-10-03 MED ORDER — METHOCARBAMOL 500 MG PO TABS: 500.0000 mg | ORAL_TABLET | Freq: Four times a day (QID) | ORAL | 0 refills | Status: DC | PRN

## 2016-10-03 MED ORDER — DOCUSATE SODIUM 100 MG PO CAPS: 100.0000 mg | ORAL_CAPSULE | Freq: Two times a day (BID) | ORAL | 0 refills | Status: DC

## 2016-10-03 MED ORDER — OXYCODONE HCL 5 MG PO TABS: 5.0000 mg | ORAL_TABLET | ORAL | 0 refills | Status: DC | PRN

## 2016-10-03 NOTE — Progress Notes (Signed)
Subjective: Pt stable - moving well on floor   Objective: Vital signs in last 24 hours: Temp:  [98.7 F (37.1 C)-100.1 F (37.8 C)] 98.7 F (37.1 C) (02/01 0900) Pulse Rate:  [101-109] 102 (02/01 0951) Resp:  [16-18] 17 (02/01 0951) BP: (112-121)/(61-71) 112/61 (02/01 0900) SpO2:  [98 %-100 %] 98 % (02/01 0951)  Intake/Output from previous day: 01/31 0701 - 02/01 0700 In: 716 [P.O.:716] Out: -  Intake/Output this shift: Total I/O In: 240 [P.O.:240] Out: -   Exam:  Sensation intact distally Intact pulses distally Dorsiflexion/Plantar flexion intact  Labs: No results for input(s): HGB in the last 72 hours. No results for input(s): WBC, RBC, HCT, PLT in the last 72 hours. No results for input(s): NA, K, CL, CO2, BUN, CREATININE, GLUCOSE, CALCIUM in the last 72 hours. No results for input(s): LABPT, INR in the last 72 hours.  Assessment/Plan: Plan dc am   Anderson Malta 10/03/2016, 12:43 PM

## 2016-10-03 NOTE — Progress Notes (Signed)
Orthopedic Tech Progress Note Patient Details:  Angel Hebert 03-19-1956 [id number]  Patient ID: [name], female   DOB: 1955/09/09, 61 y.o.   MRN: [id number] Pt wants to wait till after breakfast to get in cpm. Will call when ready.  Karolee Stamps 10/03/2016, 6:15 AM

## 2016-10-03 NOTE — Progress Notes (Signed)
Physical Therapy Treatment Patient Details Name: Angel Hebert MRN: [id number] DOB: 10-03-1955 Today's Date: 10/03/2016    History of Present Illness 61 yo female with h/o GERD, HTN, CAP, bacteremia, asthma who presents for left TKA after failing conservative management of left knee OA, now s/p L TKA    PT Comments    Patient is progressing toward mobility goals. Overall min guard/supervision. Continue to progress as tolerated with anticipated d/c home with HHPT.   Follow Up Recommendations  Home health PT;Supervision for mobility/OOB     Equipment Recommendations  Rolling walker with 5" wheels;3in1 (PT)    Recommendations for Other Services       Precautions / Restrictions Precautions Precautions: Knee Precaution Comments: reviewed precautions/positioning  Required Braces or Orthoses: Knee Immobilizer - Left Restrictions Weight Bearing Restrictions: Yes LLE Weight Bearing: Weight bearing as tolerated    Mobility  Bed Mobility Overal bed mobility: Needs Assistance Bed Mobility: Supine to Sit     Supine to sit: Min guard     General bed mobility comments: pt OOB in chair upon arrival  Transfers Overall transfer level: Needs assistance Equipment used: Rolling walker (2 wheeled) Transfers: Sit to/from Stand Sit to Stand: Min guard         General transfer comment: min guard for safety; cues for safe hand placement  Ambulation/Gait Ambulation/Gait assistance: Min guard Ambulation Distance (Feet): 130 Feet Assistive device: Rolling walker (2 wheeled) Gait Pattern/deviations: Step-through pattern;Decreased weight shift to left;Decreased stance time - left;Decreased step length - right Gait velocity: decreased   General Gait Details: pt with mildly antalgic gait initially; cues for posture, sequencing and proximity of RW; pt with improved WB on L LE and step length symmetry with increased distance   Stairs            Wheelchair Mobility     Modified Rankin (Stroke Patients Only)       Balance Overall balance assessment: Needs assistance Sitting-balance support: No upper extremity supported Sitting balance-Leahy Scale: Normal     Standing balance support: Single extremity supported Standing balance-Leahy Scale: Fair Standing balance comment: relies on RW for stability                    Cognition Arousal/Alertness: Awake/alert Behavior During Therapy: WFL for tasks assessed/performed Overall Cognitive Status: Within Functional Limits for tasks assessed                      Exercises Total Joint Exercises Quad Sets: AROM;Both;10 reps Short Arc Quad: AROM;Left;10 reps Heel Slides: AROM;Left;10 reps Hip ABduction/ADduction: Left;10 reps;AROM Straight Leg Raises: AAROM;Left;10 reps    General Comments        Pertinent Vitals/Pain Pain Assessment: 0-10 Pain Score: 10-Worst pain ever Pain Location: left knee Pain Descriptors / Indicators: Guarding;Sore Pain Intervention(s): Limited activity within patient's tolerance;Monitored during session;Premedicated before session;Repositioned    Home Living                      Prior Function            PT Goals (current goals can now be found in the care plan section) Acute Rehab PT Goals Patient Stated Goal: Decreased pain left knee PT Goal Formulation: With patient/family Time For Goal Achievement: 10/09/16 Potential to Achieve Goals: Good Progress towards PT goals: Progressing toward goals    Frequency    7X/week      PT Plan Current plan remains appropriate    Co-evaluation  End of Session Equipment Utilized During Treatment: Gait belt Activity Tolerance: Patient tolerated treatment well Patient left: with call bell/phone within reach;with family/visitor present;in bed;in CPM     Time: [id number] PT Time Calculation (min) (ACUTE ONLY): 36 min  Charges:  $Gait Training: 8-22 mins $Therapeutic  Exercise: 8-22 mins                    G Codes:      Salina April, PTA Pager: [phone]   10/03/2016, 2:38 PM

## 2016-10-03 NOTE — Progress Notes (Signed)
Occupational Therapy Treatment Patient Details Name: Angel Hebert MRN: [id number] DOB: 06-29-1956 Today's Date: 10/03/2016    History of present illness 61 yo female with h/o GERD, HTN, CAP, bacteremia, asthma who presents for left TKA after failing conservative management of left knee OA, now s/p L TKA   OT comments  Pt progressing towards acute OT goals. Pt requesting to void upon therapist arrival. Focus of session was toilet transfer and education on tub transfer using 3n1 as shower seat. D/c plan remains appropriate.    Follow Up Recommendations  No OT follow up;Supervision - Intermittent    Equipment Recommendations  3 in 1 bedside commode    Recommendations for Other Services      Precautions / Restrictions Precautions Precautions: Knee Precaution Comments: reviewed precautions Required Braces or Orthoses: Knee Immobilizer - Left Restrictions Weight Bearing Restrictions: Yes LLE Weight Bearing: Weight bearing as tolerated       Mobility Bed Mobility Overal bed mobility: Needs Assistance Bed Mobility: Supine to Sit     Supine to sit: Min guard     General bed mobility comments: extra time and effort  Transfers Overall transfer level: Needs assistance Equipment used: Rolling walker (2 wheeled) Transfers: Sit to/from Stand Sit to Stand: Min guard         General transfer comment: from EOB, regular height toilet, and recliner    Balance Overall balance assessment: Needs assistance Sitting-balance support: No upper extremity supported Sitting balance-Leahy Scale: Normal     Standing balance support: Bilateral upper extremity supported Standing balance-Leahy Scale: Fair Standing balance comment: relies on RW for stability                   ADL Overall ADL's : Needs assistance/impaired                         Toilet Transfer: Min guard;Regular Toilet;Grab bars;RW Armed forces technical officer Details (indicate cue type and reason): no 3n1 in  room. notified nursing staff. Pt min guard from lower seat surface. would benefit from 3n1 over toilet. Toileting- Water quality scientist and Hygiene: Min guard;Sit to/from stand       Functional mobility during ADLs: Min guard;Rolling walker General ADL Comments: Pt requesting to use the bathroom upon therapist arrival. Pt completed bed mobility, toilet transfer, and pericare as detailed above. Provided handout and educated on tub transfer to 3n1.       Vision                     Perception     Praxis      Cognition   Behavior During Therapy: WFL for tasks assessed/performed Overall Cognitive Status: Within Functional Limits for tasks assessed                       Extremity/Trunk Assessment               Exercises     Shoulder Instructions       General Comments      Pertinent Vitals/ Pain       Pain Assessment: 0-10 Pain Score: 10-Worst pain ever Pain Location: left knee Pain Descriptors / Indicators: Aching;Operative site guarding Pain Intervention(s): Limited activity within patient's tolerance;Monitored during session;Repositioned;Other (comment) (Nurse gave oral meds just before OT session)  Home Living  Prior Functioning/Environment              Frequency  Min 2X/week        Progress Toward Goals  OT Goals(current goals can now be found in the care plan section)  Progress towards OT goals: Progressing toward goals  Acute Rehab OT Goals Patient Stated Goal: Decreased pain left knee OT Goal Formulation: With patient Time For Goal Achievement: 10/09/16 Potential to Achieve Goals: Good ADL Goals Pt Will Perform Lower Body Dressing: with min guard assist;with caregiver independent in assisting;sit to/from stand;sitting/lateral leans Pt Will Transfer to Toilet: with modified independence;ambulating;bedside commode Pt Will Perform Toileting - Clothing Manipulation and  hygiene: with modified independence;sitting/lateral leans;sit to/from stand Pt Will Perform Tub/Shower Transfer: Tub transfer;with min assist;with caregiver independent in assisting;ambulating;rolling walker;3 in 1  Plan Discharge plan remains appropriate    Co-evaluation                 End of Session Equipment Utilized During Treatment: Gait belt;Rolling walker;Left knee immobilizer   Activity Tolerance Patient tolerated treatment well;Patient limited by pain   Patient Left in chair;with call bell/phone within reach   Nurse Communication          Time: [id number] OT Time Calculation (min): 27 min  Charges: OT General Charges $OT Visit: 1 Procedure OT Evaluation $OT Eval Low Complexity: 1 Procedure OT Treatments $Therapeutic Activity: 23-37 mins  Hortencia Pilar 10/03/2016, 1:00 PM

## 2016-10-03 NOTE — Progress Notes (Signed)
Orthopedic Tech Progress Note Patient Details:  Angel Hebert [birth year] [id number]  CPM Left Knee CPM Left Knee: On Left Knee Flexion (Degrees): 60 Left Knee Extension (Degrees): -5   [name] 10/03/2016, 4:43 PM

## 2016-10-03 NOTE — Progress Notes (Signed)
Physical Therapy Treatment Patient Details Name: Angel Hebert MRN: [id number] DOB: [birth year] Today's Date: 10/03/2016    History of Present Illness 61 yo female with h/o GERD, HTN, CAP, bacteremia, asthma who presents for left TKA after failing conservative management of left knee OA, now s/p L TKA    PT Comments    Patient continues to progress with gait and practiced bed mobility this session. Pt will be d/cing to sister's home and has 3 steps. Patient needs to practice stairs next session.     Follow Up Recommendations  Home health PT;Supervision for mobility/OOB     Equipment Recommendations  Rolling walker with 5" wheels;3in1 (PT)    Recommendations for Other Services       Precautions / Restrictions Precautions Precautions: Knee Precaution Comments: reviewed precautions/positioning  Restrictions Weight Bearing Restrictions: Yes LLE Weight Bearing: Weight bearing as tolerated    Mobility  Bed Mobility Overal bed mobility: Needs Assistance Bed Mobility: Supine to Sit;Sit to Supine     Supine to sit: Supervision Sit to supine: Supervision   General bed mobility comments: supervision for safety; cues for sequencing and technique and pt able to "hook" R foot under L LE to assist to EOB and back into bed  Transfers Overall transfer level: Needs assistance Equipment used: Rolling walker (2 wheeled) Transfers: Sit to/from Stand Sit to Stand: Min guard         General transfer comment: min guard for safety; cues for safe hand placement  Ambulation/Gait Ambulation/Gait assistance: Min guard Ambulation Distance (Feet): 175 Feet Assistive device: Rolling walker (2 wheeled) Gait Pattern/deviations: Step-through pattern;Decreased weight shift to left;Decreased stance time - left;Decreased step length - right Gait velocity: decreased   General Gait Details: cues for sequencing and posture; pt with improved gait mechanics this session   Stairs             Wheelchair Mobility    Modified Rankin (Stroke Patients Only)       Balance Overall balance assessment: Needs assistance Sitting-balance support: No upper extremity supported Sitting balance-Leahy Scale: Normal     Standing balance support: Single extremity supported Standing balance-Leahy Scale: Fair Standing balance comment: relies on RW for stability                    Cognition Arousal/Alertness: Awake/alert Behavior During Therapy: WFL for tasks assessed/performed Overall Cognitive Status: Within Functional Limits for tasks assessed                      Exercises Total Joint Exercises Quad Sets: AROM;Both;10 reps Short Arc Quad: AROM;Left;10 reps Heel Slides: AROM;Left;10 reps Hip ABduction/ADduction: Left;10 reps;AROM Straight Leg Raises: AAROM;Left;10 reps    General Comments        Pertinent Vitals/Pain Pain Assessment: Faces Pain Score: 10-Worst pain ever Faces Pain Scale: Hurts little more Pain Location: left knee Pain Descriptors / Indicators: Guarding;Sore Pain Intervention(s): Limited activity within patient's tolerance;Monitored during session;Premedicated before session;Repositioned    Home Living                      Prior Function            PT Goals (current goals can now be found in the care plan section) Acute Rehab PT Goals Patient Stated Goal: Decreased pain left knee PT Goal Formulation: With patient/family Time For Goal Achievement: 10/09/16 Potential to Achieve Goals: Good Progress towards PT goals: Progressing toward goals    Frequency  7X/week      PT Plan Current plan remains appropriate    Co-evaluation             End of Session Equipment Utilized During Treatment: Gait belt Activity Tolerance: Patient tolerated treatment well Patient left: with call bell/phone within reach;with family/visitor present;in bed;Other (comment) (L LE in zero degree foam)     Time: [id number] PT  Time Calculation (min) (ACUTE ONLY): 22 min  Charges:  $Gait Training: 8-22 mins $Therapeutic Exercise: 8-22 mins                    G Codes:      Salina April, PTA Pager: [phone]   10/03/2016, 4:31 PM

## 2016-10-04 ENCOUNTER — Telehealth (INDEPENDENT_AMBULATORY_CARE_PROVIDER_SITE_OTHER): Payer: Self-pay

## 2016-10-04 DIAGNOSIS — M1712 Unilateral primary osteoarthritis, left knee: Principal | ICD-10-CM

## 2016-10-04 MED ORDER — POLYETHYLENE GLYCOL 3350 17 G PO PACK
17.0000 g | PACK | Freq: Every day | ORAL | Status: DC
  Administered 2016-10-04: 17 g via ORAL
  Filled 2016-10-04 (×2): qty 1

## 2016-10-04 NOTE — Telephone Encounter (Signed)
Angel Hebert at [facility] on [address] would like an order for patient's constipation.  Call back number is [phone]. Please Advise. Thank You

## 2016-10-04 NOTE — Progress Notes (Signed)
Pt discharge instructions including wound and RX reviewed with pt and family verb understanding.. Pt left via wheelchair in NAD with family at side.

## 2016-10-04 NOTE — Progress Notes (Signed)
Physical Therapy Treatment Patient Details Name: Angel Hebert MRN: [id number] DOB: 01/04/1956 Today's Date: 10/04/2016    History of Present Illness 61 yo female with h/o GERD, HTN, CAP, bacteremia, asthma who presents for left TKA after failing conservative management of left knee OA, now s/p L TKA    PT Comments    Patient continues to make progress toward mobility goals and tolerated stair training this session. Continue to progress as tolerated with anticipated d/c home with HHPT.   Follow Up Recommendations  Home health PT;Supervision for mobility/OOB     Equipment Recommendations  Rolling walker with 5" wheels;3in1 (PT)    Recommendations for Other Services       Precautions / Restrictions Precautions Precautions: Knee Precaution Comments: reviewed precautions/positioning  Restrictions Weight Bearing Restrictions: Yes LLE Weight Bearing: Weight bearing as tolerated    Mobility  Bed Mobility Overal bed mobility: Needs Assistance Bed Mobility: Supine to Sit;Sit to Supine     Supine to sit: Supervision Sit to supine: Supervision   General bed mobility comments: supervision for safety; cues for sequencing   Transfers Overall transfer level: Needs assistance Equipment used: Rolling walker (2 wheeled) Transfers: Sit to/from Stand Sit to Stand: Supervision         General transfer comment: supervision for safety; from EOB and BSC; carry over of safe hand placement  Ambulation/Gait Ambulation/Gait assistance: Supervision Ambulation Distance (Feet): 150 Feet Assistive device: Rolling walker (2 wheeled) Gait Pattern/deviations: Step-through pattern;Decreased weight shift to left;Decreased step length - right;Decreased stance time - left;Antalgic Gait velocity: decreased   General Gait Details: cues for sequencing and posture; heavy reliance on RW   Stairs Stairs: Yes   Stair Management: One rail Left;Step to pattern;Forwards;Sideways Number of  Stairs: 2 General stair comments: assist to steady and HHA when ascending and with L hand rail; cues for sequencing and technique  Wheelchair Mobility    Modified Rankin (Stroke Patients Only)       Balance Overall balance assessment: Needs assistance Sitting-balance support: No upper extremity supported Sitting balance-Leahy Scale: Normal     Standing balance support: Single extremity supported Standing balance-Leahy Scale: Fair Standing balance comment: relies on RW for stability                    Cognition Arousal/Alertness: Awake/alert Behavior During Therapy: WFL for tasks assessed/performed Overall Cognitive Status: Within Functional Limits for tasks assessed                      Exercises      General Comments        Pertinent Vitals/Pain Pain Assessment: Faces Faces Pain Scale: Hurts little more Pain Location: left knee Pain Descriptors / Indicators: Guarding;Sore Pain Intervention(s): Limited activity within patient's tolerance;Monitored during session;Premedicated before session;Repositioned    Home Living                      Prior Function            PT Goals (current goals can now be found in the care plan section) Acute Rehab PT Goals Patient Stated Goal: Decreased pain left knee PT Goal Formulation: With patient/family Time For Goal Achievement: 10/09/16 Potential to Achieve Goals: Good Progress towards PT goals: Progressing toward goals    Frequency    7X/week      PT Plan Current plan remains appropriate    Co-evaluation             End  of Session Equipment Utilized During Treatment: Gait belt Activity Tolerance: Patient tolerated treatment well Patient left: with call bell/phone within reach;Other (comment);in chair (L LE in zero degree foam)     Time: 1116-1150 PT Time Calculation (min) (ACUTE ONLY): 34 min  Charges:  $Gait Training: 8-22 mins $Therapeutic Activity: 8-22 mins                     G Codes:      Salina April, PTA Pager: [phone]   10/04/2016, 12:02 PM

## 2016-10-04 NOTE — Care Management Note (Signed)
Case Management Note  Patient Details  Name: Angel Hebert MRN: [id number] Date of Birth: 10-12-1955  Subjective/Objective:   61 yr old female s/p left total knee arthroplasty.                  Action/Plan: Case manager spoke  with patient concerning [place] and DME needs.Patient was preoperatively setup with [facility], no changes.  Patient asked case manager if she could get her housing because she is technically homeless. CM asked about her living arrangements prior to her surgery, patient explained she has a place [place] but cant stay there on Sunday. CM contacted patient's sister, Angel Hebert at [phone], and discussed possibility of patient staying with her for a few weeks. She has agreed and states she would call patient. Her Address is [address], [place] [address]. DME has been delivered to patient's room.    Expected Discharge Date:  10/04/16               Expected Discharge Plan:  [place]  In-House Referral:  NA  Discharge planning Services  CM Consult  Post Acute Care Choice:  Durable Medical Equipment, Home Health Choice offered to:  Patient  DME Arranged:  [facility]-N-1[place] DME Agency:  [place]:  [place] Agency:  [facility])  Status of Service:  Completed, signed off  If discussed at [employer] of Stay Meetings, dates discussed:    Additional Comments:  Angel Meeker, RN 10/04/2016, 2:55 PM

## 2016-10-04 NOTE — Progress Notes (Signed)
Rept received from [place]. Pt resting quietly SF in bed. Resps even and unlabored. Pt easily aroused. No s/sx resp or cardiac distress and no c/o such. Pt denies pain. Will continue to monitor.

## 2016-10-04 NOTE — Telephone Encounter (Signed)
Dr Marlou Sa called [facility]

## 2016-10-04 NOTE — Progress Notes (Signed)
Physical Therapy Treatment Patient Details Name: Angel Hebert MRN: [id number] DOB: [birth year] Today's Date: 10/04/2016    History of Present Illness 61 yo female with h/o GERD, HTN, CAP, bacteremia, asthma who presents for left TKA after failing conservative management of left knee OA, now s/p L TKA    PT Comments    Patient tolerated therex well and is progressing with ROM. Reviewed HEP handout and discussed frequency, use of zero degree foam and ice, as well as precautions and positioning. Current plan remains appropriate.   Follow Up Recommendations  Home health PT;Supervision for mobility/OOB     Equipment Recommendations  Rolling walker with 5" wheels;3in1 (PT)    Recommendations for Other Services       Precautions / Restrictions Precautions Precautions: Knee Precaution Comments: reviewed precautions/positioning  Restrictions Weight Bearing Restrictions: Yes LLE Weight Bearing: Weight bearing as tolerated    Mobility  Bed Mobility Overal bed mobility: Needs Assistance Bed Mobility: Supine to Sit;Sit to Supine     Supine to sit: Supervision Sit to supine: Supervision   General bed mobility comments: pt OOB in chair upon arrival  Transfers Overall transfer level: Needs assistance Equipment used: Rolling walker (2 wheeled) Transfers: Sit to/from Stand Sit to Stand: Supervision         General transfer comment: supervision for safety; from EOB and BSC; carry over of safe hand placement  Ambulation/Gait Ambulation/Gait assistance: Supervision Ambulation Distance (Feet): 150 Feet Assistive device: Rolling walker (2 wheeled) Gait Pattern/deviations: Step-through pattern;Decreased weight shift to left;Decreased step length - right;Decreased stance time - left;Antalgic Gait velocity: decreased   General Gait Details: cues for sequencing and posture; heavy reliance on RW   Stairs Stairs: Yes   Stair Management: One rail Left;Step to  pattern;Forwards;Sideways Number of Stairs: 2 General stair comments: assist to steady and HHA when ascending and with L hand rail; cues for sequencing and technique  Wheelchair Mobility    Modified Rankin (Stroke Patients Only)       Balance Overall balance assessment: Needs assistance Sitting-balance support: No upper extremity supported Sitting balance-Leahy Scale: Normal     Standing balance support: Single extremity supported Standing balance-Leahy Scale: Fair Standing balance comment: relies on RW for stability                    Cognition Arousal/Alertness: Awake/alert Behavior During Therapy: WFL for tasks assessed/performed Overall Cognitive Status: Within Functional Limits for tasks assessed                      Exercises Total Joint Exercises Ankle Circles/Pumps: AROM;Both;10 reps Quad Sets: AROM;Both;10 reps Short Arc Quad: AROM;Left;10 reps Heel Slides: AROM;Left;10 reps Hip ABduction/ADduction: AROM;Left;10 reps Straight Leg Raises: AAROM;Left;10 reps Long Arc Quad: AROM;Left;10 reps Knee Flexion: AROM;Left;5 reps;Seated;Other (comment) (10 sec holds) Goniometric ROM: 0-92    General Comments        Pertinent Vitals/Pain Pain Assessment: Faces Faces Pain Scale: Hurts little more (6 with flexion exercises) Pain Location: left knee Pain Descriptors / Indicators: Guarding;Sore;Grimacing;Tightness Pain Intervention(s): Limited activity within patient's tolerance;Monitored during session;Premedicated before session;Repositioned    Home Living                      Prior Function            PT Goals (current goals can now be found in the care plan section) Acute Rehab PT Goals Patient Stated Goal: Decreased pain left knee PT Goal Formulation: With patient/family  Time For Goal Achievement: 10/09/16 Potential to Achieve Goals: Good Progress towards PT goals: Progressing toward goals    Frequency    7X/week      PT  Plan Current plan remains appropriate    Co-evaluation             End of Session Equipment Utilized During Treatment: Gait belt Activity Tolerance: Patient tolerated treatment well Patient left: with call bell/phone within reach;Other (comment);in chair (L LE in zero degree foam)     Time: 1431-1456 PT Time Calculation (min) (ACUTE ONLY): 25 min  Charges:   $Therapeutic Exercise: 23-37 mins                     G Codes:      Salina April, PTA Pager: [phone]   10/04/2016, 3:03 PM

## 2016-10-08 ENCOUNTER — Telehealth (INDEPENDENT_AMBULATORY_CARE_PROVIDER_SITE_OTHER): Payer: Self-pay | Admitting: Orthopedic Surgery

## 2016-10-08 NOTE — Telephone Encounter (Signed)
IC Debbie and advised ok. LMVM

## 2016-10-08 NOTE — Telephone Encounter (Signed)
Angel Hebert from [place] [facility] called requesting to clarify orders for [facility] physical therapy.  HHPT would like to continue 3x a week for 2 weeks.  [id number]

## 2016-10-10 ENCOUNTER — Telehealth (INDEPENDENT_AMBULATORY_CARE_PROVIDER_SITE_OTHER): Payer: Self-pay | Admitting: Orthopedic Surgery

## 2016-10-10 ENCOUNTER — Other Ambulatory Visit (INDEPENDENT_AMBULATORY_CARE_PROVIDER_SITE_OTHER): Payer: Self-pay | Admitting: Orthopedic Surgery

## 2016-10-10 MED ORDER — OXYCODONE HCL 5 MG PO TABS: 5.0000 mg | ORAL_TABLET | Freq: Four times a day (QID) | ORAL | 0 refills | Status: DC | PRN

## 2016-10-10 NOTE — Telephone Encounter (Signed)
Rx request- oxycodone postop

## 2016-10-10 NOTE — Telephone Encounter (Signed)
rx done pls clal thx

## 2016-10-10 NOTE — Telephone Encounter (Signed)
Patient calling to request more pain medication for knee.  Please call when ready to pick up.

## 2016-10-10 NOTE — Telephone Encounter (Signed)
IC patient and advised Rx ready for pickup 

## 2016-10-14 ENCOUNTER — Ambulatory Visit (INDEPENDENT_AMBULATORY_CARE_PROVIDER_SITE_OTHER): Payer: BLUE CROSS/BLUE SHIELD

## 2016-10-14 ENCOUNTER — Encounter (INDEPENDENT_AMBULATORY_CARE_PROVIDER_SITE_OTHER): Payer: Self-pay | Admitting: Orthopedic Surgery

## 2016-10-14 ENCOUNTER — Ambulatory Visit (INDEPENDENT_AMBULATORY_CARE_PROVIDER_SITE_OTHER): Payer: Medicaid Other | Admitting: Orthopedic Surgery

## 2016-10-14 DIAGNOSIS — Z96652 Presence of left artificial knee joint: Secondary | ICD-10-CM

## 2016-10-14 MED ORDER — ASPIRIN EC 325 MG PO TBEC: DELAYED_RELEASE_TABLET | ORAL | 0 refills | Status: DC

## 2016-10-14 NOTE — Progress Notes (Signed)
   Post-Op Visit Note   Patient: Angel Hebert           Date of Birth: [birth year]           MRN: [id number] Visit Date: 10/14/2016 PCP: Arnoldo Morale, MD   Assessment & Plan:  Chief Complaint:  Chief Complaint  Patient presents with  . Left Knee - Routine Post Op   Visit Diagnoses:  1. Presence of left artificial knee joint     Plan: Angel Hebert is a patient who is now 2 weeks out left total knee replacement.  She's been doing well.  On exam she flexes to 95 and comes out straight.  She can do a straight leg raise.  X-rays showed components to be in good position.  Plan is for her to continue with home health physical therapy transitioning to outpatient physical therapy.  No real calf tenderness to direct palpation.  I'm acute from the Xarelto for 3 weeks total postop and then change her over to one aspirin a day.  I'll see her back in 4 weeks for clinical recheck  Follow-Up Instructions: No Follow-up on file.   Orders:  Orders Placed This Encounter  Procedures  . XR Knee 1-2 Views Left   No orders of the defined types were placed in this encounter.   Imaging: Xr Knee 1-2 Views Left  Result Date: 10/14/2016 2 views left knee reviewed.  AP and lateral.  Hardware appears to be in good position.  The alignment on the coronal and sagittal views.  No evidence of hardware complication or fracture.   PMFS History: Patient Active Problem List   Diagnosis Date Noted  . Presence of left artificial knee joint 10/14/2016  . Arthritis of knee 10/01/2016  . Primary localized osteoarthritis of knee 12/13/2015  . Benign essential HTN   . Physical deconditioning   . Anemia, iron deficiency   . Pressure ulcer 10/14/2015  . CAP (community acquired pneumonia)   . Pleural effusion   . Sepsis ([place])   . Sepsis due to Streptococcus pneumoniae ([place])   . Bacteremia   . Community acquired pneumonia 10/08/2015  . Acute sinusitis 12/02/2014  . Sinusitis 03/17/2013  . Hyperlipidemia  03/02/2007  . HYPERTENSION 03/02/2007  . Allergic rhinitis 03/02/2007  . Asthma 03/02/2007  . GERD 03/02/2007   Past Medical History:  Diagnosis Date  . Arthritis   . Asthma    last year in December  . Dyspnea   . GERD (gastroesophageal reflux disease)   . High cholesterol   . Hypertension     Family History  Problem Relation Age of Onset  . Colon cancer Neg Hx     Past Surgical History:  Procedure Laterality Date  . ABDOMINAL HYSTERECTOMY    . nasal polyps     removed just this past tuesday at [place] facility over by [facility]  . TOTAL KNEE ARTHROPLASTY Left 10/01/2016   Procedure: LEFT TOTAL KNEE ARTHROPLASTY;  Surgeon: Meredith Pel, MD;  Location: [facility];  Service: Orthopedics;  Laterality: Left;   Social History   Occupational History  . Not on file.   Social History Main Topics  . Smoking status: Never Smoker  . Smokeless tobacco: Never Used  . Alcohol use No  . Drug use: No  . Sexual activity: Not on file

## 2016-10-14 NOTE — Discharge Summary (Signed)
Physician Discharge Summary  Patient ID: Angel Hebert MRN: [id number] DOB/AGE: October 02, 1955 61 y.o.  Admit date: 10/01/2016 Discharge date: 10/04/2016  Admission Diagnoses:  Active Problems:   Arthritis of knee   Discharge Diagnoses:  Same  Surgeries: Procedure(s): LEFT TOTAL KNEE ARTHROPLASTY on 10/01/2016   Consultants:   Discharged Condition: Stable  Hospital Course: Angel Hebert is an 61 y.o. female who was admitted 10/01/2016 with a chief complaint of Left knee pain, and found to have a diagnosis of left knee arthritis.  They were brought to the operating room on 10/01/2016 and underwent the above named procedures.  She tolerated the procedure well.  She was discharged home on postop day #3 in good condition.  She will follow-up with me in 2 weeks for evaluation.  Home health physical therapy and CPM machine treatment initiated.  Home on pain medicine and Xarelto for DVT prophylaxis  Antibiotics given:  Anti-infectives    Start     Dose/Rate Route Frequency Ordered Stop   10/01/16 1745  ceFAZolin (ANCEF) IVPB 2g/100 mL premix     2 g 200 mL/hr over 30 Minutes Intravenous Every 6 hours 10/01/16 1658 10/01/16 2306   10/01/16 1030  ceFAZolin (ANCEF) IVPB 2g/100 mL premix     2 g 200 mL/hr over 30 Minutes Intravenous To ShortStay Surgical 09/30/16 1034 10/01/16 1145    .  Recent vital signs:  Vitals:   10/03/16 2244 10/04/16 0447  BP:  102/62  Pulse:  98  Resp:  16  Temp: 99.7 F (37.6 C) 98.8 F (37.1 C)    Recent laboratory studies:  Results for orders placed or performed during the hospital encounter of 09/23/16  Urine culture  Result Value Ref Range   Specimen Description URINE, CLEAN CATCH    Special Requests NONE    Culture MULTIPLE SPECIES PRESENT, SUGGEST RECOLLECTION (A)    Report Status 09/24/2016 FINAL   Surgical pcr screen  Result Value Ref Range   MRSA, PCR NEGATIVE NEGATIVE   Staphylococcus aureus NEGATIVE NEGATIVE  CBC  Result Value  Ref Range   WBC 8.3 4.0 - 10.5 K/uL   RBC 3.70 (L) 3.87 - 5.11 MIL/uL   Hemoglobin 10.9 (L) 12.0 - 15.0 g/dL   HCT 33.8 (L) 36.0 - 46.0 %   MCV 91.4 78.0 - 100.0 fL   MCH 29.5 26.0 - 34.0 pg   MCHC 32.2 30.0 - 36.0 g/dL   RDW 16.3 (H) 11.5 - 15.5 %   Platelets 510 (H) 150 - 400 K/uL  Urinalysis, Routine w reflex microscopic  Result Value Ref Range   Color, Urine STRAW (A) YELLOW   APPearance CLEAR CLEAR   Specific Gravity, Urine 1.009 1.005 - 1.030   pH 5.0 5.0 - 8.0   Glucose, UA NEGATIVE NEGATIVE mg/dL   Hgb urine dipstick SMALL (A) NEGATIVE   Bilirubin Urine NEGATIVE NEGATIVE   Ketones, ur NEGATIVE NEGATIVE mg/dL   Protein, ur NEGATIVE NEGATIVE mg/dL   Nitrite NEGATIVE NEGATIVE   Leukocytes, UA NEGATIVE NEGATIVE   RBC / HPF 0-5 0 - 5 RBC/hpf   WBC, UA 0-5 0 - 5 WBC/hpf   Bacteria, UA FEW (A) NONE SEEN   Squamous Epithelial / LPF 0-5 (A) NONE SEEN  Comprehensive metabolic panel  Result Value Ref Range   Sodium 135 135 - 145 mmol/L   Potassium 3.6 3.5 - 5.1 mmol/L   Chloride 103 101 - 111 mmol/L   CO2 25 22 - 32 mmol/L   Glucose, Bld 91  65 - 99 mg/dL   BUN 6 6 - 20 mg/dL   Creatinine, Ser 0.70 0.44 - 1.00 mg/dL   Calcium 9.2 8.9 - 10.3 mg/dL   Total Protein 7.1 6.5 - 8.1 g/dL   Albumin 3.8 3.5 - 5.0 g/dL   AST 18 15 - 41 U/L   ALT 8 (L) 14 - 54 U/L   Alkaline Phosphatase 73 38 - 126 U/L   Total Bilirubin 0.3 0.3 - 1.2 mg/dL   GFR calc non Af Amer >60 >60 mL/min   GFR calc Af Amer >60 >60 mL/min   Anion gap 7 5 - 15  Type and screen Order type and screen if day of surgery is less than 15 days from draw of preadmission visit or order morning of surgery if day of surgery is greater than 6 days from preadmission visit.  Result Value Ref Range   ABO/RH(D) B POS    Antibody Screen NEG    Sample Expiration 10/07/2016    Extend sample reason NO TRANSFUSIONS OR PREGNANCY IN THE PAST 3 MONTHS     Discharge Medications:   Allergies as of 10/04/2016      Reactions   No  Known Allergies       Medication List    STOP taking these medications   HYDROcodone-acetaminophen 5-325 MG tablet Commonly known as:  NORCO/VICODIN   naproxen 500 MG tablet Commonly known as:  NAPROSYN     TAKE these medications   albuterol 108 (90 Base) MCG/ACT inhaler Commonly known as:  PROVENTIL HFA;VENTOLIN HFA Inhale 1-2 puffs into the lungs every 6 (six) hours as needed for wheezing or shortness of breath.   albuterol (2.5 MG/3ML) 0.083% nebulizer solution Commonly known as:  PROVENTIL Take 3 mLs (2.5 mg total) by nebulization every 6 (six) hours as needed for wheezing or shortness of breath.   atorvastatin 10 MG tablet Commonly known as:  LIPITOR TAKE 1 TABLET BY MOUTH DAILY AT 6 PM FOR CHOLESTEROL What changed:  how much to take  how to take this  when to take this  additional instructions   cetirizine 10 MG tablet Commonly known as:  ZYRTEC Take 1 tablet (10 mg total) by mouth daily.   docusate sodium 100 MG capsule Commonly known as:  COLACE Take 1 capsule (100 mg total) by mouth 2 (two) times daily.   Fluticasone-Salmeterol 500-50 MCG/DOSE Aepb Commonly known as:  ADVAIR Inhale 1 puff into the lungs every 12 (twelve) hours. What changed:  when to take this  reasons to take this   hydrochlorothiazide 25 MG tablet Commonly known as:  HYDRODIURIL TAKE 1/2 TABLET BY MOUTH DAILY FOR BLOOD PRESSURE What changed:  how much to take  how to take this  when to take this  additional instructions   irbesartan 150 MG tablet Commonly known as:  AVAPRO TAKE 1 TABLET BY MOUTH DAILY AFTER BREAKFAST FOR BLOOD PRESSURE What changed:  how much to take  how to take this  when to take this  additional instructions   methocarbamol 500 MG tablet Commonly known as:  ROBAXIN Take 1 tablet (500 mg total) by mouth every 6 (six) hours as needed for muscle spasms.   montelukast 10 MG tablet Commonly known as:  SINGULAIR Take 1 tablet (10 mg total) by  mouth at bedtime.   oxyCODONE 5 MG immediate release tablet Commonly known as:  Oxy IR/ROXICODONE Take 1-2 tablets (5-10 mg total) by mouth every 3 (three) hours as needed for breakthrough pain.  pantoprazole 20 MG tablet Commonly known as:  PROTONIX Take 1 tablet (20 mg total) by mouth daily.   rivaroxaban 10 MG Tabs tablet Commonly known as:  XARELTO Take 1 tablet (10 mg total) by mouth daily with breakfast.   traZODone 50 MG tablet Commonly known as:  DESYREL Take 1 tablet (50 mg total) by mouth at bedtime.       Diagnostic Studies: Dg Chest 2 View  Result Date: 09/23/2016 CLINICAL DATA:  Pre knee replacement evaluation. EXAM: CHEST  2 VIEW COMPARISON:  05/24/2016. FINDINGS: Normal sized heart. Clear lungs. Minimal diffuse peribronchial thickening without significant change. Diffuse osteopenia. IMPRESSION: No acute abnormality.  Minimal chronic bronchitic changes. Electronically Signed   By: Claudie Revering M.D.   On: 09/23/2016 14:14   Xr Knee 1-2 Views Left  Result Date: 10/14/2016 2 views left knee reviewed.  AP and lateral.  Hardware appears to be in good position.  The alignment on the coronal and sagittal views.  No evidence of hardware complication or fracture.   Disposition: 01-Home or Self Care  Discharge Instructions    Call MD / Call 911    Complete by:  As directed    If you experience chest pain or shortness of breath, CALL 911 and be transported to the hospital emergency room.  If you develope a fever above 101 F, pus (white drainage) or increased drainage or redness at the wound, or calf pain, call your surgeon's office.   Call MD / Call 911    Complete by:  As directed    If you experience chest pain or shortness of breath, CALL 911 and be transported to the hospital emergency room.  If you develope a fever above 101 F, pus (white drainage) or increased drainage or redness at the wound, or calf pain, call your surgeon's office.   Constipation Prevention     Complete by:  As directed    Drink plenty of fluids.  Prune juice may be helpful.  You may use a stool softener, such as Colace (over the counter) 100 mg twice a day.  Use MiraLax (over the counter) for constipation as needed.   Constipation Prevention    Complete by:  As directed    Drink plenty of fluids.  Prune juice may be helpful.  You may use a stool softener, such as Colace (over the counter) 100 mg twice a day.  Use MiraLax (over the counter) for constipation as needed.   Diet - low sodium heart healthy    Complete by:  As directed    Diet - low sodium heart healthy    Complete by:  As directed    Discharge instructions    Complete by:  As directed    Weight bearing as tolerated Ok to shower Leave dressing on until return visit Use cpm machine 1 hour three times a day - do not go beyond 90 degrees   Increase activity slowly as tolerated    Complete by:  As directed    Increase activity slowly as tolerated    Complete by:  As directed       Follow-up Information    [facility] Follow up.   Specialty:  [place] Why:  Someone from [facility] will contact you to arrange start date and time for therapy. Contact information: [address] [place] [address] [phone]            Signed: Anderson Malta 10/14/2016,  3:37 PM

## 2016-10-17 ENCOUNTER — Telehealth (INDEPENDENT_AMBULATORY_CARE_PROVIDER_SITE_OTHER): Payer: Self-pay | Admitting: *Deleted

## 2016-10-17 NOTE — Telephone Encounter (Signed)
See below, ok to do this instead of OP PT?

## 2016-10-17 NOTE — Telephone Encounter (Signed)
error 

## 2016-10-17 NOTE — Telephone Encounter (Signed)
Yes

## 2016-10-17 NOTE — Telephone Encounter (Signed)
IC and advised ok.  LMVM

## 2016-10-17 NOTE — Telephone Encounter (Signed)
[facility] called for home health physical therapy to 2x next week. Pt is not doing any out pt therapy

## 2016-10-28 NOTE — Telephone Encounter (Signed)
Patient is requesting a refill of oxycodone .  Cb#: [id number]

## 2016-10-28 NOTE — Telephone Encounter (Signed)
Rx request 

## 2016-10-29 MED ORDER — OXYCODONE HCL 5 MG PO TABS: ORAL_TABLET | ORAL | 0 refills | Status: DC

## 2016-10-29 NOTE — Telephone Encounter (Signed)
Okay to refill 1 by mouth twice a day as needed for pain #40

## 2016-10-29 NOTE — Addendum Note (Signed)
Addended byBrand Males on: 10/29/2016 04:11 PM   Modules accepted: Orders

## 2016-10-30 NOTE — Telephone Encounter (Signed)
IC patient and advised Rx ready to pickup 

## 2016-11-05 ENCOUNTER — Other Ambulatory Visit: Payer: Self-pay | Admitting: Family Medicine

## 2016-11-05 DIAGNOSIS — E785 Hyperlipidemia, unspecified: Secondary | ICD-10-CM

## 2016-11-08 ENCOUNTER — Other Ambulatory Visit: Payer: Self-pay

## 2016-11-08 ENCOUNTER — Other Ambulatory Visit: Payer: Self-pay | Admitting: Family Medicine

## 2016-11-08 DIAGNOSIS — J4520 Mild intermittent asthma, uncomplicated: Secondary | ICD-10-CM

## 2016-11-08 MED ORDER — PROAIR HFA 108 (90 BASE) MCG/ACT IN AERS: 2.0000 | INHALATION_SPRAY | Freq: Four times a day (QID) | RESPIRATORY_TRACT | 5 refills | Status: DC | PRN

## 2016-11-11 ENCOUNTER — Other Ambulatory Visit: Payer: Self-pay | Admitting: Family Medicine

## 2016-11-13 ENCOUNTER — Other Ambulatory Visit: Payer: Self-pay | Admitting: Family Medicine

## 2016-11-13 ENCOUNTER — Ambulatory Visit (INDEPENDENT_AMBULATORY_CARE_PROVIDER_SITE_OTHER): Payer: Medicaid Other | Admitting: Orthopedic Surgery

## 2016-11-14 ENCOUNTER — Telehealth (INDEPENDENT_AMBULATORY_CARE_PROVIDER_SITE_OTHER): Payer: Self-pay | Admitting: *Deleted

## 2016-11-14 MED ORDER — OXYCODONE-ACETAMINOPHEN 5-325 MG PO TABS: ORAL_TABLET | ORAL | 0 refills | Status: DC

## 2016-11-14 NOTE — Addendum Note (Signed)
Addended byLaurann Montana on: 11/14/2016 04:27 PM   Modules accepted: Orders

## 2016-11-14 NOTE — Telephone Encounter (Signed)
Can you please advise since Dr Marlou Sa not in office this week?

## 2016-11-14 NOTE — Telephone Encounter (Signed)
rx printed for Dr Erlinda Hong to sign in the morning. I called and spoke with patient advised could pick up in the morning

## 2016-11-14 NOTE — Telephone Encounter (Signed)
Patient called in this morning in regards to needing a prescription refill on her Oxycodone please. Her CB # [phone]. Thank you

## 2016-11-14 NOTE — Telephone Encounter (Signed)
Yes #30.  1 tab po bid prn pain

## 2016-11-18 ENCOUNTER — Other Ambulatory Visit: Payer: Self-pay | Admitting: Family Medicine

## 2016-11-19 ENCOUNTER — Other Ambulatory Visit: Payer: Self-pay | Admitting: Family Medicine

## 2016-11-20 ENCOUNTER — Telehealth (INDEPENDENT_AMBULATORY_CARE_PROVIDER_SITE_OTHER): Payer: Self-pay | Admitting: Orthopedic Surgery

## 2016-11-20 ENCOUNTER — Ambulatory Visit (INDEPENDENT_AMBULATORY_CARE_PROVIDER_SITE_OTHER): Payer: Medicaid Other | Admitting: Orthopedic Surgery

## 2016-11-20 ENCOUNTER — Encounter (INDEPENDENT_AMBULATORY_CARE_PROVIDER_SITE_OTHER): Payer: Self-pay | Admitting: Orthopedic Surgery

## 2016-11-20 DIAGNOSIS — M1712 Unilateral primary osteoarthritis, left knee: Secondary | ICD-10-CM

## 2016-11-20 NOTE — Progress Notes (Signed)
   Post-Op Visit Note   Patient: Angel Hebert           Date of Birth: Jan 29, 1956           MRN: [id number] Visit Date: 11/20/2016 PCP: Arnoldo Morale, MD   Assessment & Plan:  Chief Complaint:  Chief Complaint  Patient presents with  . Left Knee - Follow-up   Visit Diagnoses:  1. Unilateral primary osteoarthritis, left knee     Plan: Angel Hebert is a 61 year old female who is now 6 weeks out left total knee replacement.  She's been doing well.  On exam she has full extension and flexion easily past 90.  Still is having some pain but is not out of the ordinary.  Plan at this time two-month return.  I can refill her pain medicine 1 time  Follow-Up Instructions: No Follow-up on file.   Orders:  No orders of the defined types were placed in this encounter.  No orders of the defined types were placed in this encounter.   Imaging: No results found.  PMFS History: Patient Active Problem List   Diagnosis Date Noted  . Presence of left artificial knee joint 10/14/2016  . Arthritis of knee 10/01/2016  . Unilateral primary osteoarthritis, left knee 12/13/2015  . Benign essential HTN   . Physical deconditioning   . Anemia, iron deficiency   . Pressure ulcer 10/14/2015  . CAP (community acquired pneumonia)   . Pleural effusion   . Sepsis ([place])   . Sepsis due to Streptococcus pneumoniae ([place])   . Bacteremia   . Community acquired pneumonia 10/08/2015  . Acute sinusitis 12/02/2014  . Sinusitis 03/17/2013  . Hyperlipidemia 03/02/2007  . HYPERTENSION 03/02/2007  . Allergic rhinitis 03/02/2007  . Asthma 03/02/2007  . GERD 03/02/2007   Past Medical History:  Diagnosis Date  . Arthritis   . Asthma    last year in December  . Dyspnea   . GERD (gastroesophageal reflux disease)   . High cholesterol   . Hypertension     Family History  Problem Relation Age of Onset  . Colon cancer Neg Hx     Past Surgical History:  Procedure Laterality Date  . ABDOMINAL HYSTERECTOMY      . nasal polyps     removed just this past tuesday at [facility] facility over by [facility]  . TOTAL KNEE ARTHROPLASTY Left 10/01/2016   Procedure: LEFT TOTAL KNEE ARTHROPLASTY;  Surgeon: Meredith Pel, MD;  Location: [facility];  Service: Orthopedics;  Laterality: Left;   Social History   Occupational History  . Not on file.   Social History Main Topics  . Smoking status: Never Smoker  . Smokeless tobacco: Never Used  . Alcohol use No  . Drug use: No  . Sexual activity: Not on file

## 2016-11-20 NOTE — Telephone Encounter (Signed)
Patient called stating that since she has medicaid, she was only allowed to get 14 pills.  She wanted to make sure that we knew she had medicaid for her insurance.  Dr. Marlou Sa had written for a quantity of 30.

## 2016-11-21 ENCOUNTER — Telehealth (INDEPENDENT_AMBULATORY_CARE_PROVIDER_SITE_OTHER): Payer: Self-pay | Admitting: Radiology

## 2016-11-21 NOTE — Telephone Encounter (Signed)
I called pt, she only got #14 of oxycodone per Dr Erlinda Hong.  Per Dr Marlou Sa ok to get the rest of the Rx for her, will send for auth to [facility] and pt can get that next Wednesday on schedule per Rx Xu wrote.

## 2016-11-25 NOTE — Telephone Encounter (Signed)
PENDING PRIOR AUTH

## 2016-11-25 NOTE — Telephone Encounter (Signed)
[place] for rest thx

## 2016-11-25 NOTE — Telephone Encounter (Signed)
This is a medicaid PA we need to send in.

## 2016-11-25 NOTE — Telephone Encounter (Signed)
Ok to fill for #16 more tablets. Patient only received 14 of the 30 prescribed because her insurance requires prior auth.

## 2016-11-26 ENCOUNTER — Telehealth (INDEPENDENT_AMBULATORY_CARE_PROVIDER_SITE_OTHER): Payer: Self-pay | Admitting: Orthopedic Surgery

## 2016-11-26 MED ORDER — OXYCODONE-ACETAMINOPHEN 5-325 MG PO TABS: ORAL_TABLET | ORAL | 0 refills | Status: DC

## 2016-11-26 NOTE — Telephone Encounter (Signed)
This is the one we sent for PA.  Will you please followup?  Thanks

## 2016-11-26 NOTE — Addendum Note (Signed)
Addended byLaurann Montana on: 11/26/2016 02:20 PM   Modules accepted: Orders

## 2016-11-26 NOTE — Telephone Encounter (Signed)
Tried to call patient. No answer. Left detailed message advising patient that rx was approved by insurance but she would have to come by to pick up rx to take to pharmacy. Advised her on VM that she would not be able to pick up rx until tomorrow morning because it had to be signed by Dr Marlou Sa and he would not be in until then.

## 2016-11-26 NOTE — Telephone Encounter (Signed)
Medication was authorized PA# [id number] valid through 05/25/17

## 2016-11-26 NOTE — Telephone Encounter (Signed)
Patient returned your call.  CB#[phone]

## 2016-11-26 NOTE — Telephone Encounter (Signed)
Called spoke with patient and advised working on trying to get insurance to authorize rx. Advised her we would let her know as soon as we knew something.

## 2016-11-26 NOTE — Telephone Encounter (Signed)
Patient called this morning requesting a refill on her oxycodone.  She uses [facility] on [address]. CB#[phone].  Thank you.

## 2016-11-27 ENCOUNTER — Encounter: Payer: Self-pay | Admitting: Family Medicine

## 2016-11-27 ENCOUNTER — Ambulatory Visit: Payer: Medicaid Other | Attending: Family Medicine | Admitting: Family Medicine

## 2016-11-27 DIAGNOSIS — Z96652 Presence of left artificial knee joint: Secondary | ICD-10-CM | POA: Diagnosis not present

## 2016-11-27 DIAGNOSIS — E7800 Pure hypercholesterolemia, unspecified: Secondary | ICD-10-CM | POA: Insufficient documentation

## 2016-11-27 DIAGNOSIS — Z7901 Long term (current) use of anticoagulants: Secondary | ICD-10-CM | POA: Insufficient documentation

## 2016-11-27 DIAGNOSIS — M170 Bilateral primary osteoarthritis of knee: Secondary | ICD-10-CM | POA: Diagnosis not present

## 2016-11-27 DIAGNOSIS — E785 Hyperlipidemia, unspecified: Secondary | ICD-10-CM | POA: Insufficient documentation

## 2016-11-27 DIAGNOSIS — J302 Other seasonal allergic rhinitis: Secondary | ICD-10-CM | POA: Insufficient documentation

## 2016-11-27 DIAGNOSIS — Z7982 Long term (current) use of aspirin: Secondary | ICD-10-CM | POA: Insufficient documentation

## 2016-11-27 DIAGNOSIS — I10 Essential (primary) hypertension: Secondary | ICD-10-CM | POA: Diagnosis not present

## 2016-11-27 DIAGNOSIS — J4520 Mild intermittent asthma, uncomplicated: Secondary | ICD-10-CM | POA: Diagnosis not present

## 2016-11-27 DIAGNOSIS — K219 Gastro-esophageal reflux disease without esophagitis: Secondary | ICD-10-CM | POA: Insufficient documentation

## 2016-11-27 MED ORDER — ALBUTEROL SULFATE (2.5 MG/3ML) 0.083% IN NEBU: 2.5000 mg | INHALATION_SOLUTION | Freq: Four times a day (QID) | RESPIRATORY_TRACT | 3 refills | Status: DC | PRN

## 2016-11-27 MED ORDER — ATORVASTATIN CALCIUM 10 MG PO TABS
ORAL_TABLET | ORAL | 5 refills | Status: DC
Start: 2016-11-27 — End: 2017-03-03

## 2016-11-27 MED ORDER — FLUTICASONE PROPIONATE 50 MCG/ACT NA SUSP: 2.0000 | Freq: Every day | NASAL | 6 refills | Status: DC

## 2016-11-27 MED ORDER — FLUTICASONE-SALMETEROL 500-50 MCG/DOSE IN AEPB: 1.0000 | INHALATION_SPRAY | Freq: Two times a day (BID) | RESPIRATORY_TRACT | 5 refills | Status: DC

## 2016-11-27 MED ORDER — PROAIR HFA 108 (90 BASE) MCG/ACT IN AERS: 2.0000 | INHALATION_SPRAY | Freq: Four times a day (QID) | RESPIRATORY_TRACT | 5 refills | Status: DC | PRN

## 2016-11-27 MED ORDER — MONTELUKAST SODIUM 10 MG PO TABS: 10.0000 mg | ORAL_TABLET | Freq: Every day | ORAL | 5 refills | Status: DC

## 2016-11-27 MED ORDER — IRBESARTAN 150 MG PO TABS: ORAL_TABLET | ORAL | 5 refills | Status: DC

## 2016-11-27 MED ORDER — CETIRIZINE HCL 10 MG PO TABS: 10.0000 mg | ORAL_TABLET | Freq: Every day | ORAL | 5 refills | Status: DC

## 2016-11-27 MED ORDER — NAPROXEN 500 MG PO TABS: 500.0000 mg | ORAL_TABLET | Freq: Two times a day (BID) | ORAL | 3 refills | Status: DC

## 2016-11-27 MED ORDER — PANTOPRAZOLE SODIUM 20 MG PO TBEC: 20.0000 mg | DELAYED_RELEASE_TABLET | Freq: Every day | ORAL | 5 refills | Status: DC

## 2016-11-27 MED ORDER — HYDROCHLOROTHIAZIDE 25 MG PO TABS: ORAL_TABLET | ORAL | 5 refills | Status: DC

## 2016-11-27 MED ORDER — TRAZODONE HCL 50 MG PO TABS: 50.0000 mg | ORAL_TABLET | Freq: Every day | ORAL | 5 refills | Status: DC

## 2016-11-27 NOTE — Progress Notes (Signed)
Subjective:  Patient ID: Angel Hebert, female    DOB: 17-Aug-1956  Age: 61 y.o. MRN: [id number]  CC: Hypertension and Nasal Congestion   HPI Angel Hebert Is a 61 -year-old female with a history of hypertension, hyperlipidemia, asthma, allergic rhinitis, nasal polyps (Status post sinus surgery in 10/2016), GERD, bilateral knee osteoarthritis (status post left total knee arthroplasty in 09/2016) who comes in for a follow-up visit.  She has completed physical therapy and still follows up with her orthopedics - Dr. Marlou Sa from whom she receives oxycodone but has run out. She complains of some swelling of her left knee joint and pain worse with movement; she ambulates with a cane.  Complaints of persisting nasal congestion and review of her chart indicates she is not on Flonase but as per the patient she took this in the past. Denies fever, cough but does have some postnasal drainage.  Hospital is controlled and she denies wheezing or shortness of breath. She has been compliant with her statin and antihypertensive.  Past Medical History:  Diagnosis Date  . Arthritis   . Asthma    last year in December  . Dyspnea   . GERD (gastroesophageal reflux disease)   . High cholesterol   . Hypertension     Past Surgical History:  Procedure Laterality Date  . ABDOMINAL HYSTERECTOMY    . nasal polyps     removed just this past tuesday at [facility] facility over by [facility]  . TOTAL KNEE ARTHROPLASTY Left 10/01/2016   Procedure: LEFT TOTAL KNEE ARTHROPLASTY;  Surgeon: Meredith Pel, MD;  Location: [place];  Service: Orthopedics;  Laterality: Left;    Allergies  Allergen Reactions  . No Known Allergies      Outpatient Medications Prior to Visit  Medication Sig Dispense Refill  . aspirin EC 325 MG tablet Begin taking aspirin on 10/27/2016 30 tablet 0  . oxyCODONE (OXY IR/ROXICODONE) 5 MG immediate release tablet Take 1-2 tablets (5-10 mg total) by mouth every 3 (three) hours as  needed for breakthrough pain. 60 tablet 0  . oxyCODONE-acetaminophen (PERCOCET/ROXICET) 5-325 MG tablet 1 tab po bid prn pain 16 tablet 0  . albuterol (PROVENTIL HFA;VENTOLIN HFA) 108 (90 Base) MCG/ACT inhaler Inhale 1-2 puffs into the lungs every 6 (six) hours as needed for wheezing or shortness of breath. 1 Inhaler 2  . albuterol (PROVENTIL) (2.5 MG/3ML) 0.083% nebulizer solution Take 3 mLs (2.5 mg total) by nebulization every 6 (six) hours as needed for wheezing or shortness of breath. 150 mL 1  . atorvastatin (LIPITOR) 10 MG tablet TAKE 1 TABLET BY MOUTH DAILY AT 6 PM FOR CHOLESTEROL 30 tablet 2  . cetirizine (ZYRTEC) 10 MG tablet Take 1 tablet (10 mg total) by mouth daily. 30 tablet 5  . docusate sodium (COLACE) 100 MG capsule Take 1 capsule (100 mg total) by mouth 2 (two) times daily. 10 capsule 0  . Fluticasone-Salmeterol (ADVAIR) 500-50 MCG/DOSE AEPB Inhale 1 puff into the lungs every 12 (twelve) hours. (Patient taking differently: Inhale 1 puff into the lungs daily as needed (shortness of breath). ) 60 each 5  . hydrochlorothiazide (HYDRODIURIL) 25 MG tablet TAKE 1/2 TABLET BY MOUTH DAILY FOR BLOOD PRESSURE (Patient taking differently: Take 12.5 mg by mouth daily. ) 30 tablet 5  . irbesartan (AVAPRO) 150 MG tablet TAKE 1 TABLET BY MOUTH DAILY AFTER BREAKFAST FOR BLOOD PRESSURE (Patient taking differently: Take 150 mg by mouth daily. AFTER BREAKFAST FOR BLOOD PRESSURE) 30 tablet 5  . montelukast (  SINGULAIR) 10 MG tablet Take 1 tablet (10 mg total) by mouth at bedtime. 30 tablet 5  . pantoprazole (PROTONIX) 20 MG tablet Take 1 tablet (20 mg total) by mouth daily. 30 tablet 5  . PROAIR HFA 108 (90 Base) MCG/ACT inhaler Inhale 2 puffs into the lungs every 6 (six) hours as needed for wheezing or shortness of breath. 1 Inhaler 5  . traZODone (DESYREL) 50 MG tablet Take 1 tablet (50 mg total) by mouth at bedtime. 30 tablet 5  . methocarbamol (ROBAXIN) 500 MG tablet Take 1 tablet (500 mg total) by  mouth every 6 (six) hours as needed for muscle spasms. (Patient not taking: Reported on 11/27/2016) 30 tablet 0  . oxyCODONE (OXY IR/ROXICODONE) 5 MG immediate release tablet 1 po bid prn pain 40 tablet 0  . rivaroxaban (XARELTO) 10 MG TABS tablet Take 1 tablet (10 mg total) by mouth daily with breakfast. (Patient not taking: Reported on 11/27/2016) 21 tablet 0   No facility-administered medications prior to visit.     ROS Review of Systems Constitutional: Negative for activity change, appetite change and fatigue.  HENT:       See history of present illness  Eyes: Negative for visual disturbance.  Respiratory: Negative for cough, chest tightness, shortness of breath and wheezing.   Cardiovascular: Negative for chest pain and palpitations.  Gastrointestinal: Negative for abdominal distention, abdominal pain and constipation.  Endocrine: Negative for polydipsia.  Genitourinary: Negative for dysuria and frequency.  Musculoskeletal: See history of present illness Skin: Negative for rash.  Neurological: Negative for tremors, light-headedness and numbness.  Hematological: Does not bruise/bleed easily.  Psychiatric/Behavioral: Negative for agitation and behavioral problems  Objective:  BP 133/77 (BP Location: Right Arm, Patient Position: Sitting, Cuff Size: Small)   Pulse 99   Temp 98.2 F (36.8 C) (Oral)   Ht 5\' 7"  (1.702 m)   Wt 170 lb 9.6 oz (77.4 kg)   SpO2 98%   BMI 26.72 kg/m   BP/Weight 11/27/2016 10/04/2016 0/53/9767  Systolic BP 341 937 -  Diastolic BP 77 62 -  Wt. (Lbs) 170.6 171 -  BMI 26.72 - 26.78      Physical Exam  Constitutional: She is oriented to person, place, and time. She appears well-developed and well-nourished.  HENT:  Right Ear: External ear normal.  Left Ear: External ear normal.  Cardiovascular: Normal rate, normal heart sounds and intact distal pulses.   No murmur heard. Pulmonary/Chest: Effort normal and breath sounds normal. She has no wheezes. She  has no rales. She exhibits no tenderness.  Abdominal: Soft. Bowel sounds are normal. She exhibits no distension and no mass. There is no tenderness.  Musculoskeletal:  Right knee: Normal Left knee: Slight edema, healed vertical surgical scar, mild tenderness and restricted range of motion  Neurological: She is alert and oriented to person, place, and time.  Skin: Skin is warm and dry.  Psychiatric: She has a normal mood and affect.     CMP Latest Ref Rng & Units 09/23/2016 05/20/2016 10/26/2015  Glucose 65 - 99 mg/dL 91 92 66  BUN 6 - 20 mg/dL 6 5(L) 6(L)  Creatinine 0.44 - 1.00 mg/dL 0.70 0.55 0.43(L)  Sodium 135 - 145 mmol/L 135 133(L) 136  Potassium 3.5 - 5.1 mmol/L 3.6 3.6 4.1  Chloride 101 - 111 mmol/L 103 95(L) 101  CO2 22 - 32 mmol/L 25 28 28   Calcium 8.9 - 10.3 mg/dL 9.2 9.8 8.9  Total Protein 6.5 - 8.1 g/dL 7.1 7.4 -  Total Bilirubin 0.3 - 1.2 mg/dL 0.3 0.8 -  Alkaline Phos 38 - 126 U/L 73 89 -  AST 15 - 41 U/L 18 17 -  ALT 14 - 54 U/L 8(L) 7 -    Lipid Panel     Component Value Date/Time   CHOL 185 05/20/2016 1452   TRIG 129 05/20/2016 1452   HDL 74 05/20/2016 1452   CHOLHDL 2.5 05/20/2016 1452   VLDL 26 05/20/2016 1452   LDLCALC 85 05/20/2016 1452    Assessment & Plan:   1. Essential hypertension Controlled Low-sodium diet - irbesartan (AVAPRO) 150 MG tablet; TAKE 1 TABLET BY MOUTH DAILY AFTER BREAKFAST FOR BLOOD PRESSURE  Dispense: 30 tablet; Refill: 5 - hydrochlorothiazide (HYDRODIURIL) 25 MG tablet; TAKE 1/2 TABLET BY MOUTH DAILY FOR BLOOD PRESSURE  Dispense: 30 tablet; Refill: 5  2. Gastroesophageal reflux disease, esophagitis presence not specified Controlled - pantoprazole (PROTONIX) 20 MG tablet; Take 1 tablet (20 mg total) by mouth daily.  Dispense: 30 tablet; Refill: 5  3. Pure hypercholesterolemia Controlled Low-cholesterol diet - atorvastatin (LIPITOR) 10 MG tablet; TAKE 1 TABLET BY MOUTH DAILY AT 6 PM FOR CHOLESTEROL  Dispense: 30 tablet;  Refill: 5  4. Other seasonal allergic rhinitis Uncontrolled Status post sinus surgery Flonase added to her regimen - cetirizine (ZYRTEC) 10 MG tablet; Take 1 tablet (10 mg total) by mouth daily.  Dispense: 30 tablet; Refill: 5  5. Mild intermittent asthma without complication No acute exacerbation - PROAIR HFA 108 (90 Base) MCG/ACT inhaler; Inhale 2 puffs into the lungs every 6 (six) hours as needed for wheezing or shortness of breath.  Dispense: 1 Inhaler; Refill: 5  6. History of total knee arthroplasty, left Residual pain Awaiting oxycodone from orthopedics Placed on naproxen  Keep follow-up appointment with Dr. Alphonzo Severance in 2 months.   Meds ordered this encounter  Medications  . montelukast (SINGULAIR) 10 MG tablet    Sig: Take 1 tablet (10 mg total) by mouth at bedtime.    Dispense:  30 tablet    Refill:  5  . Fluticasone-Salmeterol (ADVAIR) 500-50 MCG/DOSE AEPB    Sig: Inhale 1 puff into the lungs every 12 (twelve) hours.    Dispense:  60 each    Refill:  5  . irbesartan (AVAPRO) 150 MG tablet    Sig: TAKE 1 TABLET BY MOUTH DAILY AFTER BREAKFAST FOR BLOOD PRESSURE    Dispense:  30 tablet    Refill:  5  . hydrochlorothiazide (HYDRODIURIL) 25 MG tablet    Sig: TAKE 1/2 TABLET BY MOUTH DAILY FOR BLOOD PRESSURE    Dispense:  30 tablet    Refill:  5  . pantoprazole (PROTONIX) 20 MG tablet    Sig: Take 1 tablet (20 mg total) by mouth daily.    Dispense:  30 tablet    Refill:  5  . atorvastatin (LIPITOR) 10 MG tablet    Sig: TAKE 1 TABLET BY MOUTH DAILY AT 6 PM FOR CHOLESTEROL    Dispense:  30 tablet    Refill:  5  . cetirizine (ZYRTEC) 10 MG tablet    Sig: Take 1 tablet (10 mg total) by mouth daily.    Dispense:  30 tablet    Refill:  5  . traZODone (DESYREL) 50 MG tablet    Sig: Take 1 tablet (50 mg total) by mouth at bedtime.    Dispense:  30 tablet    Refill:  5  . albuterol (PROVENTIL) (2.5 MG/3ML) 0.083% nebulizer solution  Sig: Take 3 mLs (2.5 mg total)  by nebulization every 6 (six) hours as needed for wheezing or shortness of breath.    Dispense:  75 mL    Refill:  3  . PROAIR HFA 108 (90 Base) MCG/ACT inhaler    Sig: Inhale 2 puffs into the lungs every 6 (six) hours as needed for wheezing or shortness of breath.    Dispense:  1 Inhaler    Refill:  5  . fluticasone (FLONASE) 50 MCG/ACT nasal spray    Sig: Place 2 sprays into both nostrils daily.    Dispense:  16 g    Refill:  6  . naproxen (NAPROSYN) 500 MG tablet    Sig: Take 1 tablet (500 mg total) by mouth 2 (two) times daily with a meal.    Dispense:  60 tablet    Refill:  3    Follow-up: Return in about 3 months (around 02/27/2017) for follow up on Chronic medical conditions.   Arnoldo Morale MD

## 2016-11-27 NOTE — Patient Instructions (Signed)
Allergies, Adult An allergy is when your body's defense system (immune system) overreacts to an otherwise harmless substance (allergen) that you breathe in or eat or something that touches your skin. When you come into contact with something that you are allergic to, your immune system produces certain proteins (antibodies). These proteins cause cells to release chemicals (histamines) that trigger the symptoms of an allergic reaction. Allergies often affect the nasal passages (allergic rhinitis), eyes (allergic conjunctivitis), skin (atopic dermatitis), and stomach. Allergies can be mild or severe. Allergies cannot spread from person to person (are not contagious). They can develop at any age and may be outgrown. What are the causes? Allergies can be caused by any substance that your immune system mistakenly targets as harmful. These may include:  Outdoor allergens, such as pollen, grass, weeds, car exhaust, and mold spores.  Indoor allergens, such as dust, smoke, mold, and pet dander.  Foods, especially peanuts, milk, eggs, fish, shellfish, soy, nuts, and wheat.  Medicines, such as penicillin.  Skin irritants, such as detergents, chemicals, and latex.  Perfume.  Insect bites or stings. What increases the risk? You may be at greater risk of allergies if other people in your family have allergies. What are the signs or symptoms? Symptoms depend on what type of allergy you have. They may include:  Runny, stuffy nose.  Sneezing.  Itchy mouth, ears, or throat.  Postnasal drip.  Sore throat.  Itchy, red, watery, or puffy eyes.  Skin rash or hives.  Stomach pain.  Vomiting.  Diarrhea.  Bloating.  Wheezing or coughing. People with a severe allergy to food, medicine, or an insect bite may have a life-threatening allergic reaction (anaphylaxis). Symptoms of anaphylaxis include:  Hives.  Itching.  Flushed face.  Swollen lips, tongue, or mouth.  Tight or swollen  throat.  Chest pain or tightness in the chest.  Trouble breathing or shortness of breath.  Rapid heartbeat.  Dizziness or fainting.  Vomiting.  Diarrhea.  Pain in the abdomen. How is this diagnosed? This condition is diagnosed based on:  Your symptoms.  Your family and medical history.  A physical exam. You may need to see a health care provider who specializes in treating allergies (allergist). You may also have tests, including:  Skin tests to see which allergens are causing your symptoms, such as:  Skin prick test. In this test, your skin is pricked with a tiny needle and exposed to small amounts of possible allergens to see if your skin reacts.  Intradermal skin test. In this test, a small amount of allergen is injected under your skin to see if your skin reacts.  Patch test. In this test, a small amount of allergen is placed on your skin and then your skin is covered with a bandage. Your health care provider will check your skin after a couple of days to see if a rash has developed.  Blood tests.  Challenges tests. In this test, you inhale a small amount of allergen by mouth to see if you have an allergic reaction. You may also be asked to:  Keep a food diary. A food diary is a record of all the foods and drinks you have in a day and any symptoms you experience.  Practice an elimination diet. An elimination diet involves eliminating specific foods from your diet and then adding them back in one by one to find out if a certain food causes an allergic reaction. How is this treated? Treatment for allergies depends on your symptoms.   Treatment may include:  Cold compresses to soothe itching and swelling.  Eye drops.  Nasal sprays.  Using a saline spray or container (neti pot) to flush out the nose (nasal irrigation). These methods can help clear away mucus and keep the nasal passages moist.  Using a humidifier.  Oral antihistamines or other medicines to block  allergic reaction and inflammation.  Skin creams to treat rashes or itching.  Diet changes to eliminate food allergy triggers.  Repeated exposure to tiny amounts of allergens to build up a tolerance and prevent future allergic reactions (immunotherapy). These include:  Allergy shots.  Oral treatment. This involves taking small doses of an allergen under the tongue (sublingual immunotherapy).  Emergency epinephrine injection (auto-injector) in case of an allergic emergency. This is a self-injectable, pre-measured medicine that must be given within the first few minutes of a serious allergic reaction. Follow these instructions at home:  Avoid known allergens whenever possible.  If you suffer from airborne allergens, wash out your nose daily. You can do this with a saline spray or a neti pot to flush out your nose (nasal irrigation).  Take over-the-counter and prescription medicines only as told by your health care provider.  Keep all follow-up visits as told by your health care provider. This is important.  If you are at risk of a severe allergic reaction (anaphylaxis), keep your auto-injector with you at all times.  If you have ever had anaphylaxis, wear a medical alert bracelet or necklace that states you have a severe allergy. Contact a health care provider if:  Your symptoms do not improve with treatment. Get help right away if:  You have symptoms of anaphylaxis, such as:  Swollen mouth, tongue, or throat.  Pain or tightness in your chest.  Trouble breathing or shortness of breath.  Dizziness or fainting.  Severe abdominal pain, vomiting, or diarrhea. This information is not intended to replace advice given to you by your health care provider. Make sure you discuss any questions you have with your health care provider. Document Released: 11/12/2002 Document Revised: 04/18/2016 Document Reviewed: 03/06/2016 Elsevier Interactive Patient Education  2017 Elsevier Inc.  

## 2016-11-27 NOTE — Progress Notes (Signed)
Medication refills- "everything"

## 2016-12-05 ENCOUNTER — Telehealth (INDEPENDENT_AMBULATORY_CARE_PROVIDER_SITE_OTHER): Payer: Self-pay | Admitting: Orthopedic Surgery

## 2016-12-05 NOTE — Telephone Encounter (Signed)
Okay to refill please call thanks

## 2016-12-05 NOTE — Telephone Encounter (Signed)
Last rx on 03/27. Ok to rf?

## 2016-12-05 NOTE — Telephone Encounter (Signed)
Patient called needing Rx refilled (Oxycodone) The number to contact patient is [phone]

## 2016-12-06 MED ORDER — OXYCODONE-ACETAMINOPHEN 5-325 MG PO TABS: ORAL_TABLET | ORAL | 0 refills | Status: DC

## 2016-12-06 NOTE — Addendum Note (Signed)
Addended byBrand Males on: 12/06/2016 08:29 AM   Modules accepted: Orders

## 2016-12-06 NOTE — Telephone Encounter (Signed)
IC patient and advised Rx ready for pickup, printed and signed by Dondra Prader.

## 2016-12-06 NOTE — Telephone Encounter (Signed)
Will you see if someone is ok writing this? Dr Marlou Sa ok'd it yesterday.

## 2016-12-06 NOTE — Addendum Note (Signed)
Addended byBrand Males on: 12/06/2016 08:23 AM   Modules accepted: Orders

## 2016-12-17 IMAGING — CR DG CHEST 1V PORT
1 series · 1 of 1 positions shown · non-contrast
Comparison: 10/08/2015

CLINICAL DATA: Pneumonia

EXAM:
PORTABLE CHEST 1 VIEW

[AP]
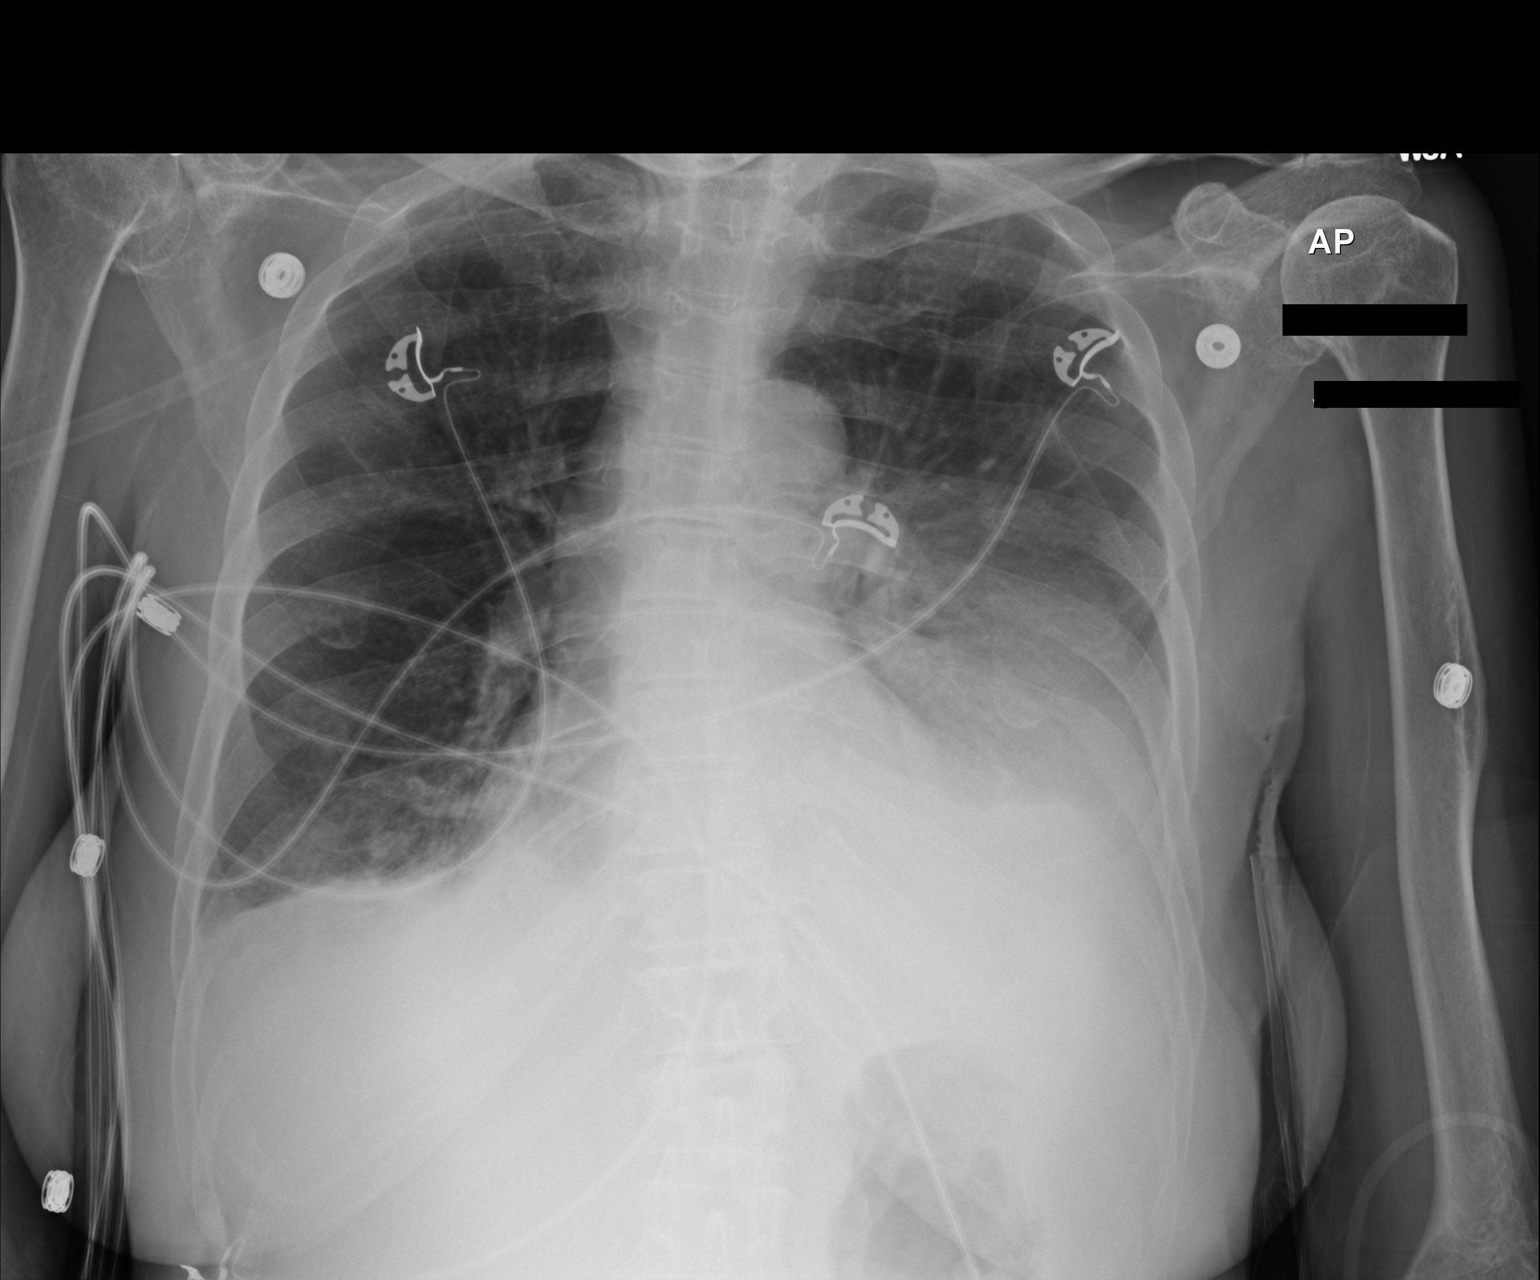

[1 of 1 positions shown; findings below may reference images not displayed]

FINDINGS: Left lower lobe consolidation unchanged. Associated left effusion
unchanged.

Mild right lower lobe airspace disease also unchanged. Negative for
heart failure or edema.
IMPRESSION: No significant change bibasilar airspace disease left greater than
right. Probable associated left pleural effusion.

## 2016-12-19 ENCOUNTER — Telehealth (INDEPENDENT_AMBULATORY_CARE_PROVIDER_SITE_OTHER): Payer: Self-pay | Admitting: Orthopedic Surgery

## 2016-12-19 MED ORDER — HYDROCODONE-ACETAMINOPHEN 5-325 MG PO TABS: ORAL_TABLET | ORAL | 0 refills | Status: DC

## 2016-12-19 NOTE — Telephone Encounter (Signed)
Do you want to change rx?

## 2016-12-19 NOTE — Telephone Encounter (Signed)
Ok to rf? 

## 2016-12-19 NOTE — Telephone Encounter (Signed)
Please change to Norco 01/03/2024 one by mouth every 6-8 hours when necessary pain.  Please explain her that we need to start tapering off the pain medicine following knee replacement

## 2016-12-19 NOTE — Telephone Encounter (Signed)
Patient advised of this. Verbalized understanding

## 2016-12-19 NOTE — Telephone Encounter (Signed)
Ok to rf pls cla lthx

## 2016-12-19 NOTE — Telephone Encounter (Signed)
Patient called needing Rx refilled (Oxycodone) The number to contact patient is [phone]

## 2017-01-08 ENCOUNTER — Telehealth (INDEPENDENT_AMBULATORY_CARE_PROVIDER_SITE_OTHER): Payer: Self-pay | Admitting: Orthopedic Surgery

## 2017-01-08 NOTE — Telephone Encounter (Signed)
Ok to rf? Last had norco 5 on 4/19 #30

## 2017-01-08 NOTE — Telephone Encounter (Signed)
Patient called needing Rx refilled (Hydrocodone). The number to contact patient is [phone]

## 2017-01-08 NOTE — Telephone Encounter (Signed)
I will have ready for patient in the morning and she is aware

## 2017-01-08 NOTE — Telephone Encounter (Signed)
Okay to fill tomorrow 1 by mouth Q 12 hours #30

## 2017-01-09 ENCOUNTER — Telehealth (INDEPENDENT_AMBULATORY_CARE_PROVIDER_SITE_OTHER): Payer: Self-pay | Admitting: Orthopedic Surgery

## 2017-01-09 MED ORDER — HYDROCODONE-ACETAMINOPHEN 5-325 MG PO TABS: ORAL_TABLET | ORAL | 0 refills | Status: DC

## 2017-01-09 NOTE — Telephone Encounter (Signed)
Patient aware may pick up at front desk.

## 2017-01-09 NOTE — Addendum Note (Signed)
Addended byLaurann Montana on: 01/09/2017 08:25 AM   Modules accepted: Orders

## 2017-01-09 NOTE — Telephone Encounter (Signed)
Patient called asking why her medicaid didn't cover her prescription of Hydrocodone. CB # [phone]

## 2017-01-09 NOTE — Telephone Encounter (Signed)
s/w patient she states medicaid would cover but cost her more money out of pocket. She wanted to know why. I advised patient was not sure that she would need to contact Medicaid. We have not received notice from pharmacy letting us know needed prior auth.

## 2017-01-15 ENCOUNTER — Encounter (INDEPENDENT_AMBULATORY_CARE_PROVIDER_SITE_OTHER): Payer: Self-pay | Admitting: Orthopedic Surgery

## 2017-01-15 ENCOUNTER — Ambulatory Visit (INDEPENDENT_AMBULATORY_CARE_PROVIDER_SITE_OTHER): Payer: Medicaid Other | Admitting: Orthopedic Surgery

## 2017-01-15 DIAGNOSIS — Z96659 Presence of unspecified artificial knee joint: Secondary | ICD-10-CM

## 2017-01-15 MED ORDER — TRAMADOL HCL 50 MG PO TABS: 50.0000 mg | ORAL_TABLET | Freq: Four times a day (QID) | ORAL | 0 refills | Status: DC | PRN

## 2017-01-18 NOTE — Progress Notes (Signed)
Office Visit Note   Patient: Angel Hebert           Date of Birth: 1956/06/26           MRN: [id number] Visit Date: 01/15/2017 Requested by: Arnoldo Morale, MD [place], [place] [address] PCP: Arnoldo Morale, MD  Subjective: Chief Complaint  Patient presents with  . Left Knee - Follow-up    HPI: Angel Hebert is a 61-year-old patient with mild left knee pain 4 months out left total knee replacement.  In general she's been doing well.  She is out of physical therapy.  She may have been doing some home exercises for range of motion and strengthening.  She's been taking some pain medication.              ROS: All systems reviewed are negative as they relate to the chief complaint within the history of present illness.  Patient denies  fevers or chills.   Assessment & Plan: Visit Diagnoses:  1. Artificial knee joint present, unspecified laterality     Plan: Impression is 4 months out left total knee replacement but no effusion and good range of motion improving quad and hamstring strength in some amount of expected residual pain.  Plan is to transition to over-the-counter pain medications.  Continue to work on quad strengthening exercises.  I'll see her back in 1 year check for follow-up.  Follow-Up Instructions: No Follow-up on file.   Orders:  No orders of the defined types were placed in this encounter.  Meds ordered this encounter  Medications  . traMADol (ULTRAM) 50 MG tablet    Sig: Take 1 tablet (50 mg total) by mouth every 6 (six) hours as needed.    Dispense:  45 tablet    Refill:  0      Procedures: No procedures performed   Clinical Data: No additional findings.  Objective: Vital Signs: There were no vitals taken for this visit.  Physical Exam:   Constitutional: Patient appears well-developed HEENT:  Head: Normocephalic Eyes:EOM are normal Neck: Normal range of motion Cardiovascular: Normal rate Pulmonary/chest: Effort  normal Neurologic: Patient is alert Skin: Skin is warm Psychiatric: Patient has normal mood and affect    Ortho Exam: Orthopedic exam demonstrates good range of motion the left knee with about a 5 flexion contracture flexion to 115 extensor mechanism is intact collaterals are stable pedal pulses are palpable mild swelling in the distal tibia is noted but there is no calf tenderness to direct palpation skin is intact in the incision also looks good  Specialty Comments:  No specialty comments available.  Imaging: No results found.   PMFS History: Patient Active Problem List   Diagnosis Date Noted  . 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 Presence of left artificial knee joint 10/14/2016  . Arthritis of knee 10/01/2016  . Unilateral primary osteoarthritis, left knee 12/13/2015  . Benign essential HTN   . Physical deconditioning   . Anemia, iron deficiency   . Pressure ulcer 10/14/2015  . CAP (community acquired pneumonia)   . Pleural effusion   . Sepsis ([place])   . Sepsis due to Streptococcus pneumoniae ([place])   . Bacteremia   . Community acquired pneumonia 10/08/2015  . Acute sinusitis 12/02/2014  . Sinusitis 03/17/2013  . Hyperlipidemia 03/02/2007  . HYPERTENSION 03/02/2007  . Allergic rhinitis 03/02/2007  . Asthma 03/02/2007  . GERD 03/02/2007   Past Medical History:  Diagnosis Date  . Arthritis   .  Asthma    last year in December  . Dyspnea   . GERD (gastroesophageal reflux disease)   . High cholesterol   . Hypertension     Family History  Problem Relation Age of Onset  . Colon cancer Neg Hx     Past Surgical History:  Procedure Laterality Date  . ABDOMINAL HYSTERECTOMY    . nasal polyps     removed just this past tuesday at [facility] facility over by [facility]  . TOTAL KNEE ARTHROPLASTY Left 10/01/2016   Procedure: LEFT TOTAL KNEE ARTHROPLASTY;  Surgeon: Meredith Pel, MD;  Location: [place];  Service: Orthopedics;  Laterality: Left;    Social History   Occupational History  . Not on file.   Social History Main Topics  . Smoking status: Never Smoker  . Smokeless tobacco: Never Used  . Alcohol use No  . Drug use: No  . Sexual activity: Not on file

## 2017-01-28 ENCOUNTER — Other Ambulatory Visit: Payer: Self-pay | Admitting: Family Medicine

## 2017-01-28 DIAGNOSIS — J302 Other seasonal allergic rhinitis: Secondary | ICD-10-CM

## 2017-01-31 DIAGNOSIS — J189 Pneumonia, unspecified organism: Secondary | ICD-10-CM

## 2017-01-31 HISTORY — DX: Pneumonia, unspecified organism: J18.9

## 2017-02-04 ENCOUNTER — Observation Stay (HOSPITAL_COMMUNITY)
Admission: EM | Admit: 2017-02-04 | Discharge: 2017-02-05 | Disposition: A | Discharge status: Home / Self Care | Payer: Medicaid Other | Attending: Student in an Organized Health Care Education/Training Program | Admitting: Student in an Organized Health Care Education/Training Program

## 2017-02-04 ENCOUNTER — Encounter (HOSPITAL_COMMUNITY): Payer: Self-pay | Admitting: *Deleted

## 2017-02-04 ENCOUNTER — Emergency Department (HOSPITAL_COMMUNITY): Payer: Medicaid Other

## 2017-02-04 DIAGNOSIS — R05 Cough: Secondary | ICD-10-CM

## 2017-02-04 DIAGNOSIS — Z7982 Long term (current) use of aspirin: Secondary | ICD-10-CM | POA: Insufficient documentation

## 2017-02-04 DIAGNOSIS — R918 Other nonspecific abnormal finding of lung field: Secondary | ICD-10-CM | POA: Diagnosis not present

## 2017-02-04 DIAGNOSIS — K219 Gastro-esophageal reflux disease without esophagitis: Secondary | ICD-10-CM | POA: Diagnosis not present

## 2017-02-04 DIAGNOSIS — Z79899 Other long term (current) drug therapy: Secondary | ICD-10-CM | POA: Insufficient documentation

## 2017-02-04 DIAGNOSIS — Z96652 Presence of left artificial knee joint: Secondary | ICD-10-CM | POA: Diagnosis not present

## 2017-02-04 DIAGNOSIS — E7800 Pure hypercholesterolemia, unspecified: Secondary | ICD-10-CM | POA: Diagnosis not present

## 2017-02-04 DIAGNOSIS — J189 Pneumonia, unspecified organism: Secondary | ICD-10-CM | POA: Diagnosis not present

## 2017-02-04 DIAGNOSIS — R509 Fever, unspecified: Secondary | ICD-10-CM

## 2017-02-04 DIAGNOSIS — J181 Lobar pneumonia, unspecified organism: Secondary | ICD-10-CM | POA: Diagnosis not present

## 2017-02-04 DIAGNOSIS — E876 Hypokalemia: Secondary | ICD-10-CM | POA: Insufficient documentation

## 2017-02-04 DIAGNOSIS — D72829 Elevated white blood cell count, unspecified: Secondary | ICD-10-CM

## 2017-02-04 DIAGNOSIS — I10 Essential (primary) hypertension: Secondary | ICD-10-CM | POA: Diagnosis not present

## 2017-02-04 DIAGNOSIS — R071 Chest pain on breathing: Secondary | ICD-10-CM

## 2017-02-04 DIAGNOSIS — Z90710 Acquired absence of both cervix and uterus: Secondary | ICD-10-CM | POA: Diagnosis not present

## 2017-02-04 DIAGNOSIS — Z7951 Long term (current) use of inhaled steroids: Secondary | ICD-10-CM | POA: Diagnosis not present

## 2017-02-04 DIAGNOSIS — E785 Hyperlipidemia, unspecified: Secondary | ICD-10-CM | POA: Diagnosis not present

## 2017-02-04 DIAGNOSIS — E871 Hypo-osmolality and hyponatremia: Secondary | ICD-10-CM | POA: Diagnosis not present

## 2017-02-04 DIAGNOSIS — R0789 Other chest pain: Secondary | ICD-10-CM | POA: Insufficient documentation

## 2017-02-04 DIAGNOSIS — R000 Tachycardia, unspecified: Secondary | ICD-10-CM | POA: Diagnosis not present

## 2017-02-04 DIAGNOSIS — Z66 Do not resuscitate: Secondary | ICD-10-CM | POA: Diagnosis not present

## 2017-02-04 DIAGNOSIS — J309 Allergic rhinitis, unspecified: Secondary | ICD-10-CM | POA: Diagnosis present

## 2017-02-04 DIAGNOSIS — J45909 Unspecified asthma, uncomplicated: Secondary | ICD-10-CM | POA: Diagnosis present

## 2017-02-04 DIAGNOSIS — Z833 Family history of diabetes mellitus: Secondary | ICD-10-CM

## 2017-02-04 HISTORY — DX: Pneumonia, unspecified organism: J18.9

## 2017-02-04 LAB — CBC WITH DIFFERENTIAL/PLATELET
BASOS PCT: 0 %
Basophils Absolute: 0 10*3/uL (ref 0.0–0.1)
EOS PCT: 0 %
Eosinophils Absolute: 0 10*3/uL (ref 0.0–0.7)
HCT: 30.7 % — ABNORMAL LOW (ref 36.0–46.0)
Hemoglobin: 10.2 g/dL — ABNORMAL LOW (ref 12.0–15.0)
LYMPHS PCT: 4 %
Lymphs Abs: 1.1 10*3/uL (ref 0.7–4.0)
MCH: 27.5 pg (ref 26.0–34.0)
MCHC: 33.2 g/dL (ref 30.0–36.0)
MCV: 82.7 fL (ref 78.0–100.0)
MONO ABS: 1.1 10*3/uL — AB (ref 0.1–1.0)
Monocytes Relative: 4 %
NEUTROS PCT: 92 %
Neutro Abs: 24.3 10*3/uL — ABNORMAL HIGH (ref 1.7–7.7)
PLATELETS: 571 10*3/uL — AB (ref 150–400)
RBC: 3.71 MIL/uL — ABNORMAL LOW (ref 3.87–5.11)
RDW: 18.8 % — ABNORMAL HIGH (ref 11.5–15.5)
WBC: 26.5 10*3/uL — AB (ref 4.0–10.5)

## 2017-02-04 LAB — COMPREHENSIVE METABOLIC PANEL
ALT: 9 U/L — ABNORMAL LOW (ref 14–54)
ANION GAP: 15 (ref 5–15)
AST: 17 U/L (ref 15–41)
Albumin: 3.6 g/dL (ref 3.5–5.0)
Alkaline Phosphatase: 107 U/L (ref 38–126)
BUN: 10 mg/dL (ref 6–20)
CHLORIDE: 97 mmol/L — AB (ref 101–111)
CO2: 21 mmol/L — AB (ref 22–32)
CREATININE: 0.78 mg/dL (ref 0.44–1.00)
Calcium: 9.3 mg/dL (ref 8.9–10.3)
GFR calc non Af Amer: >60 mL/min (ref >60)
Glucose, Bld: 84 mg/dL (ref 65–99)
POTASSIUM: 2.9 mmol/L — AB (ref 3.5–5.1)
SODIUM: 133 mmol/L — AB (ref 135–145)
Total Bilirubin: 1.4 mg/dL — ABNORMAL HIGH (ref 0.3–1.2)
Total Protein: 8.3 g/dL — ABNORMAL HIGH (ref 6.5–8.1)

## 2017-02-04 LAB — URINALYSIS, ROUTINE W REFLEX MICROSCOPIC
Bacteria, UA: NONE SEEN
Bilirubin Urine: NEGATIVE
GLUCOSE, UA: NEGATIVE mg/dL
KETONES UR: 20 mg/dL — AB
LEUKOCYTES UA: NEGATIVE
Nitrite: NEGATIVE
PROTEIN: NEGATIVE mg/dL
RBC / HPF: NONE SEEN RBC/hpf (ref 0–5)
Specific Gravity, Urine: 1.002 — ABNORMAL LOW (ref 1.005–1.030)
pH: 6 (ref 5.0–8.0)

## 2017-02-04 LAB — I-STAT TROPONIN, ED: Troponin i, poc: 0 ng/mL (ref 0.00–0.08)

## 2017-02-04 LAB — I-STAT CG4 LACTIC ACID, ED
LACTIC ACID, VENOUS: 1.18 mmol/L (ref 0.5–1.9)
Lactic Acid, Venous: 1.3 mmol/L (ref 0.5–1.9)

## 2017-02-04 MED ORDER — DEXTROSE 5 % IV SOLN
1.0000 g | INTRAVENOUS | Status: AC
  Administered 2017-02-04: 1 g via INTRAVENOUS

## 2017-02-04 MED ORDER — ATORVASTATIN CALCIUM 10 MG PO TABS
10.0000 mg | ORAL_TABLET | Freq: Every day | ORAL | Status: DC
  Administered 2017-02-04: 10 mg via ORAL
  Filled 2017-02-04: qty 1

## 2017-02-04 MED ORDER — POTASSIUM CHLORIDE CRYS ER 20 MEQ PO TBCR
40.0000 meq | EXTENDED_RELEASE_TABLET | Freq: Once | ORAL | Status: AC
  Administered 2017-02-04: 40 meq via ORAL
  Filled 2017-02-04: qty 2

## 2017-02-04 MED ORDER — ACETAMINOPHEN 325 MG PO TABS
650.0000 mg | ORAL_TABLET | Freq: Once | ORAL | Status: AC
  Administered 2017-02-04: 650 mg via ORAL
  Filled 2017-02-04: qty 2

## 2017-02-04 MED ORDER — ALBUTEROL SULFATE HFA 108 (90 BASE) MCG/ACT IN AERS: 2.0000 | INHALATION_SPRAY | Freq: Four times a day (QID) | RESPIRATORY_TRACT | Status: DC | PRN

## 2017-02-04 MED ORDER — DEXTROSE 5 % IV SOLN
1.0000 g | INTRAVENOUS | Status: DC
  Administered 2017-02-05: 1 g via INTRAVENOUS
  Filled 2017-02-04: qty 10

## 2017-02-04 MED ORDER — TRAZODONE HCL 50 MG PO TABS
50.0000 mg | ORAL_TABLET | Freq: Every day | ORAL | Status: DC
  Administered 2017-02-04: 50 mg via ORAL
  Filled 2017-02-04: qty 1

## 2017-02-04 MED ORDER — FLUTICASONE PROPIONATE 50 MCG/ACT NA SUSP
2.0000 | Freq: Every day | NASAL | Status: DC
  Administered 2017-02-05: 2 via NASAL
  Filled 2017-02-04: qty 16

## 2017-02-04 MED ORDER — POTASSIUM CHLORIDE 10 MEQ/100ML IV SOLN
10.0000 meq | Freq: Once | INTRAVENOUS | Status: AC
  Administered 2017-02-04: 10 meq via INTRAVENOUS
  Filled 2017-02-04: qty 100

## 2017-02-04 MED ORDER — ONDANSETRON HCL 4 MG PO TABS
4.0000 mg | ORAL_TABLET | Freq: Four times a day (QID) | ORAL | Status: DC | PRN
Start: 2017-02-04 — End: 2017-02-05

## 2017-02-04 MED ORDER — ENOXAPARIN SODIUM 40 MG/0.4ML ~~LOC~~ SOLN
40.0000 mg | SUBCUTANEOUS | Status: DC
  Administered 2017-02-04: 40 mg via SUBCUTANEOUS
  Filled 2017-02-04: qty 0.4

## 2017-02-04 MED ORDER — ALBUTEROL SULFATE (2.5 MG/3ML) 0.083% IN NEBU: 2.5000 mg | INHALATION_SOLUTION | Freq: Four times a day (QID) | RESPIRATORY_TRACT | Status: DC | PRN

## 2017-02-04 MED ORDER — FLUTICASONE FUROATE-VILANTEROL 200-25 MCG/INH IN AEPB
1.0000 | INHALATION_SPRAY | Freq: Every day | RESPIRATORY_TRACT | Status: DC
  Administered 2017-02-05: 1 via RESPIRATORY_TRACT
  Filled 2017-02-04: qty 28

## 2017-02-04 MED ORDER — DEXTROSE 5 % IV SOLN
500.0000 mg | INTRAVENOUS | Status: AC
  Administered 2017-02-04: 500 mg via INTRAVENOUS

## 2017-02-04 MED ORDER — MONTELUKAST SODIUM 10 MG PO TABS
10.0000 mg | ORAL_TABLET | Freq: Every day | ORAL | Status: DC
  Administered 2017-02-04: 10 mg via ORAL
  Filled 2017-02-04: qty 1

## 2017-02-04 MED ORDER — SODIUM CHLORIDE 0.9 % IV BOLUS (SEPSIS)
1000.0000 mL | Freq: Once | INTRAVENOUS | Status: AC
  Administered 2017-02-04: 1000 mL via INTRAVENOUS

## 2017-02-04 MED ORDER — ACETAMINOPHEN 325 MG PO TABS: 650.0000 mg | ORAL_TABLET | Freq: Once | ORAL | Status: DC

## 2017-02-04 MED ORDER — PANTOPRAZOLE SODIUM 20 MG PO TBEC
20.0000 mg | DELAYED_RELEASE_TABLET | Freq: Every day | ORAL | Status: DC
  Administered 2017-02-04 – 2017-02-05 (×2): 20 mg via ORAL
  Filled 2017-02-04 (×3): qty 1

## 2017-02-04 MED ORDER — DEXTROSE 5 % IV SOLN
500.0000 mg | Freq: Once | INTRAVENOUS | Status: DC
  Filled 2017-02-04: qty 500

## 2017-02-04 MED ORDER — ACETAMINOPHEN 650 MG RE SUPP: 650.0000 mg | Freq: Four times a day (QID) | RECTAL | Status: DC | PRN

## 2017-02-04 MED ORDER — POTASSIUM CHLORIDE 20 MEQ/15ML (10%) PO SOLN
40.0000 meq | Freq: Once | ORAL | Status: AC
  Administered 2017-02-04: 40 meq via ORAL
  Filled 2017-02-04: qty 30

## 2017-02-04 MED ORDER — DEXTROSE 5 % IV SOLN
1.0000 g | Freq: Once | INTRAVENOUS | Status: DC
  Filled 2017-02-04: qty 10

## 2017-02-04 MED ORDER — ONDANSETRON HCL 4 MG/2ML IJ SOLN: 4.0000 mg | Freq: Four times a day (QID) | INTRAMUSCULAR | Status: DC | PRN

## 2017-02-04 MED ORDER — HYDROCHLOROTHIAZIDE 25 MG PO TABS
12.5000 mg | ORAL_TABLET | Freq: Every day | ORAL | Status: DC
  Administered 2017-02-04 – 2017-02-05 (×2): 12.5 mg via ORAL
  Filled 2017-02-04 (×2): qty 1

## 2017-02-04 MED ORDER — IRBESARTAN 150 MG PO TABS
150.0000 mg | ORAL_TABLET | Freq: Every day | ORAL | Status: DC
  Administered 2017-02-05: 150 mg via ORAL
  Filled 2017-02-04: qty 1

## 2017-02-04 MED ORDER — DEXTROSE 5 % IV SOLN
500.0000 mg | INTRAVENOUS | Status: DC
  Administered 2017-02-05: 500 mg via INTRAVENOUS
  Filled 2017-02-04: qty 500

## 2017-02-04 MED ORDER — ACETAMINOPHEN 325 MG PO TABS
650.0000 mg | ORAL_TABLET | Freq: Four times a day (QID) | ORAL | Status: DC | PRN
  Administered 2017-02-04 – 2017-02-05 (×3): 650 mg via ORAL
  Filled 2017-02-04 (×3): qty 2

## 2017-02-04 NOTE — H&P (Signed)
Date: 02/04/2017               Patient Name:  Angel Hebert MRN: [id number]  DOB: 04-Aug-1956 Age / Sex: 61 y.o., female   PCP: Arnoldo Morale, MD              Medical Service: Internal Medicine Teaching Service              Attending Physician: Dr. Evette Doffing, Mallie Mussel, *    First Contact: Marylen Ponto, MS4 Pager: [phone]  Second Contact: Dr. Posey Pronto Pager: [phone]            After Hours (After 5p/  First Contact Pager: [phone]  weekends / holidays): Second Contact Pager: [phone]   Chief Complaint: Chest pain  History of Present Illness:  Angel Hebert is a 61 yo woman with history of HTN, HLD, asthma, allergic rhinitis, GERD, knee OA s/p left total knee replacement who presents with a 2 day history of fever, cough, SOB, diarrhea, and right-sided sharp pleuritic chest pain. Cough is nonproductive and she has 2-3 watery stools / day.  In addition to fever, she notes night sweats and chills. She denies any preceding illness, but states that one of her roommates is sick with a cough.  She has not been hospitalized recently, denies any recent travel, and has not been on any antibiotics recently.  She says these symptoms are very similar to when she had an episode of CAP ~ 1 year ago.  She denies any associated symptoms sore throat, rhinorrhea, abd pain, nausea, vomiting, myalgias, arthralgias, or dysuria.   Additionally, patient states that she takes her asthma medication daily and usually has good asthma control.  She has no concerns of pain, swelling, infx, or injury to left knee s/p replacement.   In the ED, she was febrile, with mild tacchycardia and leukocytosis. Lactate wnl at 1.18.  CXR showed RU lobe PNA.  She received 1 L NS bolus and was started on azithromycin and ceftriaxone.   Meds: Current Meds  Medication Sig  . albuterol (PROVENTIL) (2.5 MG/3ML) 0.083% nebulizer solution Take 3 mLs (2.5 mg total) by nebulization every 6 (six) hours as needed for wheezing or  shortness of breath.  . ALL DAY ALLERGY 10 MG tablet TAKE 1 TABLET BY MOUTH DAILY.  Marland Kitchen aspirin EC 325 MG tablet Begin taking aspirin on 10/27/2016  . atorvastatin (LIPITOR) 10 MG tablet TAKE 1 TABLET BY MOUTH DAILY AT 6 PM FOR CHOLESTEROL  . fluticasone (FLONASE) 50 MCG/ACT nasal spray Place 2 sprays into both nostrils daily.  . Fluticasone-Salmeterol (ADVAIR) 500-50 MCG/DOSE AEPB Inhale 1 puff into the lungs every 12 (twelve) hours.  . hydrochlorothiazide (HYDRODIURIL) 25 MG tablet TAKE 1/2 TABLET BY MOUTH DAILY FOR BLOOD PRESSURE  . HYDROcodone-acetaminophen (NORCO/VICODIN) 5-325 MG tablet 1 po q 12hrs prn pain  . irbesartan (AVAPRO) 150 MG tablet TAKE 1 TABLET BY MOUTH DAILY AFTER BREAKFAST FOR BLOOD PRESSURE  . montelukast (SINGULAIR) 10 MG tablet Take 1 tablet (10 mg total) by mouth at bedtime.  . pantoprazole (PROTONIX) 20 MG tablet Take 1 tablet (20 mg total) by mouth daily.  Marland Kitchen PROAIR HFA 108 (90 Base) MCG/ACT inhaler Inhale 2 puffs into the lungs every 6 (six) hours as needed for wheezing or shortness of breath.  . traMADol (ULTRAM) 50 MG tablet Take 1 tablet (50 mg total) by mouth every 6 (six) hours as needed.  . traZODone (DESYREL) 50 MG tablet Take 1 tablet (50 mg total) by mouth at bedtime.  Allergies: Allergies as of 02/04/2017 - Review Complete 02/04/2017  Allergen Reaction Noted  . No known allergies  09/30/2016   Past Medical History:  Diagnosis Date  . Arthritis   . Asthma    last year in December  . Dyspnea   . GERD (gastroesophageal reflux disease)   . High cholesterol   . Hypertension     Family History:  Significant only for sister with DM  Social History:  Denies any alcohol, tobacco, or illicit drug use.  Lives with boyfriend and roommate.   Review of Systems: Constitutional: Positive for fever, chills, night sweats HEENT: No sinus pain. No change in vision or hearing.  CV:  Positive for chest pain.  No palpitations, orthopnea Resp: Positive for  cough and shortness of breath GI: Positive for diarrhea. Denies abdominal pain, nausea, vomiting.  No constipation, blood in stool, dysphagia MSK: Denies arthralgias, myalgias, joint pain Urology: Denies dysuria, hematuria, change in urinary frequency, change in urinary urgency Skin: Denies new rash, new worrisome lesions Neurology: Denies headache, numbness Psychology:  Denies depressed mood, agitation, sleep problems   Physical Exam: Blood pressure (!) 144/92, pulse 98, temperature (!) 100.4 F (38 C), temperature source Oral, resp. rate (!) 29, SpO2 96 %. BP (!) 144/92   Pulse 98   Temp (!) 100.4 F (38 C) (Oral)   Resp (!) 29   SpO2 96%   Gen: Well-appearing in NAD.  Sitting up with mild cough. Neuro:  Alert and oriented x 3. Strength 5/5 bilaterally in upper and lower extremities. HEENT:  Head: NCAT. Eyes: EOMI. PERRLA.  Mouth: Moist mucous membranes. Neck:  No thyromegaly. CV: Mildly tachycardic but regular. No murmurs, rubs, or gallops. Pulm: Normal respiratory effort.  Sating well on RA.  CTAB, no wheezes or rhonchi. Abdomen: Soft, non-tender to palpation in four quadrants.  Normal BS appreciated.  Extremities: No skin lesions appreciated.  No LE edema.  Pulses in all extremities intact.  Imaging:  CXR 02/04/17 FINDINGS: Cardiac silhouette normal in size, unchanged. Minimal thoracic aortic arch atherosclerosis, unchanged. Hilar and mediastinal contours otherwise unremarkable. Confluent airspace consolidation involving the inferior right upper. Possible small right pleural effusion. Linear scarring in the left lower lobe. Lungs otherwise clear. Pulmonary vascularity normal. Degenerative changes involving the thoracic and upper lumbar spine.  IMPRESSION: Acute dense segmental pneumonia involving the inferior right upper lobe and possible small right pleural effusion. Given the findings on a prior CT from February, 2017, mucous plugging with post-obstructive  pneumonia is suspected.   Assessment & Plan by Problem: Principal Problem:   CAP (community acquired pneumonia) Active Problems:   Essential hypertension   Allergic rhinitis   Asthma   GERD  Angel Hebert is a 61 yo woman with history of HTN, HLD, asthma, allergic rhinitis, GERD, knee OA s/p left total knee replacement with CAP.   CAP - Fever, cough, SOB, right sided pleuritic chest pain, sick contact, and lobular consolidation on CXR consistent with community-acquired PNA.  CXR shows dense segmental PNA of right upper lobe and small right sided pleural effusion.  Some concern for post-obstructive PNA 2/2 mucous plugging.  However her cough is non-productive and lung exam seems less consistent with mucous plugging.  WBC of 26.5, predominately PMNs. Given mild hyponatremia and diarrheal illness, atypical PNA including legionaire's disease was considered.  However, she denies any exposures suggesting legionaire's disease including hotel stay/ exposure to water sources and her symptoms are extremely mild. Current abx would cover legionella as well.  Mild hyponatremia  likely represented low volume status. Will start with routine CAP coverage of ceftriaxone and azithromycin and complete infectious work up with BC.  UA negative.  -ceftriaxone, azithromycin for CAP coverage - urine strep pneumo antigen - urine legionella antigen -f/u BC x2  Asthma - On advair and montelukast with PRN albuterol as outpatient.  - continue outpatient montelukast -switch advair to breo as this is on formulary here - albuterol PRN q6 for wheezing or SOB  Hypokalemia - Potassium of 2.9 - replete with 10 mEq IV and 80 mEq oral K  HTN - On irbesartan 150 mg and HCTZ 12.5 qday as outpatient. - continue outpatient regimen  GERD - continue outpatient pantoprazole 20 mg  HLD  - continue outpatient atorvastatin 10 mg qday  FEN - normal diet  VTE proph - Lovenox  Code status - DNR   Signed: Doug Sou, Medical Student 02/04/2017, 2:53 PM   Attestation for Student Documentation:  I personally was present and performed or re-performed the history, physical exam and medical decision-making activities of this service and have verified that the service and findings are accurately documented in the student's note.  Zada Finders, MD 02/04/2017, 4:20 PM

## 2017-02-04 NOTE — ED Triage Notes (Signed)
Pt states that she began having rt sided chest pain and SOB  Last night. Pt states that she has diarrhea as well. Reports pain is worse with breathing.

## 2017-02-04 NOTE — ED Provider Notes (Signed)
[facility] DEPT Provider Note   CSN: [id number] Arrival date & time: 02/04/17  9509     History   Chief Complaint Chief Complaint  Patient presents with  . Chest Pain    HPI Angel Hebert is a 61 y.o. female.  HPI  61 y.o. female with a hx of HTN, HLD, presents to the Emergency Department today complaining of right sided chest pain with associated shortness of breath since last night. States pain is constant and occurred at rest this AM. Denies cough/congestion or URI symptoms. Did endorse a few episodes of diarrhea that resolved. Rates chest pain on right side 6/10. Pressure sensation. Worse with inspiration. Noted hx of same last year with "walking pneumonia." No fevers at home. No hx DVT/PE. No hx ACS. Does endorse left knee surgery in January with compliance on blood thinners, which she currently is not on. No other symptoms noted.   Past Medical History:  Diagnosis Date  . Arthritis   . Asthma    last year in December  . Dyspnea   . GERD (gastroesophageal reflux disease)   . High cholesterol   . Hypertension     Patient Active Problem List   Diagnosis Date Noted  . 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 Presence of left artificial knee joint 10/14/2016  . Arthritis of knee 10/01/2016  . Unilateral primary osteoarthritis, left knee 12/13/2015  . Benign essential HTN   . Physical deconditioning   . Anemia, iron deficiency   . Pressure ulcer 10/14/2015  . CAP (community acquired pneumonia)   . Pleural effusion   . Sepsis ([place])   . Sepsis due to Streptococcus pneumoniae ([place])   . Bacteremia   . Community acquired pneumonia 10/08/2015  . Acute sinusitis 12/02/2014  . Sinusitis 03/17/2013  . Hyperlipidemia 03/02/2007  . HYPERTENSION 03/02/2007  . Allergic rhinitis 03/02/2007  . Asthma 03/02/2007  . GERD 03/02/2007    Past Surgical History:  Procedure Laterality Date  . ABDOMINAL HYSTERECTOMY    . nasal polyps     removed just this  past tuesday at [place] facility over by [facility]  . TOTAL KNEE ARTHROPLASTY Left 10/01/2016   Procedure: LEFT TOTAL KNEE ARTHROPLASTY;  Surgeon: Meredith Pel, MD;  Location: [facility];  Service: Orthopedics;  Laterality: Left;    OB History    No data available       Home Medications    Prior to Admission medications   Medication Sig Start Date End Date Taking? Authorizing Provider  albuterol (PROVENTIL) (2.5 MG/3ML) 0.083% nebulizer solution Take 3 mLs (2.5 mg total) by nebulization every 6 (six) hours as needed for wheezing or shortness of breath. 11/27/16   Arnoldo Morale, MD  ALL DAY ALLERGY 10 MG tablet TAKE 1 TABLET BY MOUTH DAILY. 01/28/17   Arnoldo Morale, MD  aspirin EC 325 MG tablet Begin taking aspirin on 10/27/2016 10/14/16   Meredith Pel, MD  atorvastatin (LIPITOR) 10 MG tablet TAKE 1 TABLET BY MOUTH DAILY AT 6 PM FOR CHOLESTEROL 11/27/16   Arnoldo Morale, MD  fluticasone (FLONASE) 50 MCG/ACT nasal spray Place 2 sprays into both nostrils daily. 11/27/16   Arnoldo Morale, MD  Fluticasone-Salmeterol (ADVAIR) 500-50 MCG/DOSE AEPB Inhale 1 puff into the lungs every 12 (twelve) hours. 11/27/16   Arnoldo Morale, MD  hydrochlorothiazide (HYDRODIURIL) 25 MG tablet TAKE 1/2 TABLET BY MOUTH DAILY FOR BLOOD PRESSURE 11/27/16   Arnoldo Morale, MD  HYDROcodone-acetaminophen (NORCO/VICODIN) 5-325 MG tablet 1 po q 12hrs prn  pain 01/09/17   Leandrew Koyanagi, MD  irbesartan (AVAPRO) 150 MG tablet TAKE 1 TABLET BY MOUTH DAILY AFTER BREAKFAST FOR BLOOD PRESSURE 11/27/16   Arnoldo Morale, MD  montelukast (SINGULAIR) 10 MG tablet Take 1 tablet (10 mg total) by mouth at bedtime. 11/27/16   Arnoldo Morale, MD  naproxen (NAPROSYN) 500 MG tablet Take 1 tablet (500 mg total) by mouth 2 (two) times daily with a meal. 11/27/16   Arnoldo Morale, MD  oxyCODONE (OXY IR/ROXICODONE) 5 MG immediate release tablet Take 1-2 tablets (5-10 mg total) by mouth every 3 (three) hours as needed for breakthrough pain. 10/03/16   Meredith Pel, MD  oxyCODONE-acetaminophen (PERCOCET/ROXICET) 5-325 MG tablet 1 tab po bid prn pain 12/06/16   Suzan Slick, NP  pantoprazole (PROTONIX) 20 MG tablet Take 1 tablet (20 mg total) by mouth daily. 11/27/16   Arnoldo Morale, MD  PROAIR HFA 108 (90 Base) MCG/ACT inhaler Inhale 2 puffs into the lungs every 6 (six) hours as needed for wheezing or shortness of breath. 11/27/16   Arnoldo Morale, MD  traMADol (ULTRAM) 50 MG tablet Take 1 tablet (50 mg total) by mouth every 6 (six) hours as needed. 01/15/17   Meredith Pel, MD  traZODone (DESYREL) 50 MG tablet Take 1 tablet (50 mg total) by mouth at bedtime. 11/27/16   Arnoldo Morale, MD    Family History Family History  Problem Relation Age of Onset  . Colon cancer Neg Hx     Social History Social History  Substance Use Topics  . Smoking status: Never Smoker  . Smokeless tobacco: Never Used  . Alcohol use No     Allergies   No known allergies   Review of Systems Review of Systems ROS reviewed and all are negative for acute change except as noted in the HPI.  Physical Exam Updated Vital Signs BP 134/71 (BP Location: Right Arm)   Pulse 99   Temp (!) 100.4 F (38 C) (Oral)   Resp 18   SpO2 98%   Physical Exam  Constitutional: She is oriented to person, place, and time. Vital signs are normal. She appears well-developed and well-nourished.  HENT:  Head: Normocephalic and atraumatic.  Right Ear: Hearing normal.  Left Ear: Hearing normal.  Eyes: Conjunctivae and EOM are normal. Pupils are equal, round, and reactive to light.  Neck: Normal range of motion. Neck supple.  Cardiovascular: Normal rate, regular rhythm, normal heart sounds and intact distal pulses.   Pulmonary/Chest: Effort normal and breath sounds normal. She has no decreased breath sounds. She has no wheezes. She has no rhonchi. She has no rales.  Right anterior chest wall TTP. No palpable or visible deformities.   Abdominal: There is no tenderness.    Musculoskeletal: Normal range of motion.  Neurological: She is alert and oriented to person, place, and time.  Skin: Skin is warm and dry.  Psychiatric: She has a normal mood and affect. Her speech is normal and behavior is normal. Thought content normal.  Nursing note and vitals reviewed.  ED Treatments / Results  Labs (all labs ordered are listed, but only abnormal results are displayed) Labs Reviewed  COMPREHENSIVE METABOLIC PANEL - Abnormal; Notable for the following:       Result Value   Sodium 133 (*)    Potassium 2.9 (*)    Chloride 97 (*)    CO2 21 (*)    Total Protein 8.3 (*)    ALT 9 (*)  Total Bilirubin 1.4 (*)    All other components within normal limits  CBC WITH DIFFERENTIAL/PLATELET - Abnormal; Notable for the following:    WBC 26.5 (*)    RBC 3.71 (*)    Hemoglobin 10.2 (*)    HCT 30.7 (*)    RDW 18.8 (*)    Platelets 571 (*)    Neutro Abs 24.3 (*)    Monocytes Absolute 1.1 (*)    All other components within normal limits  URINALYSIS, ROUTINE W REFLEX MICROSCOPIC - Abnormal; Notable for the following:    Color, Urine STRAW (*)    Specific Gravity, Urine 1.002 (*)    Hgb urine dipstick LARGE (*)    Ketones, ur 20 (*)    Squamous Epithelial / LPF 0-5 (*)    All other components within normal limits  CULTURE, BLOOD (ROUTINE X 2)  CULTURE, BLOOD (ROUTINE X 2)  I-STAT CG4 LACTIC ACID, ED  I-STAT TROPOININ, ED  I-STAT CG4 LACTIC ACID, ED    EKG  EKG Interpretation  Date/Time:  Tuesday February 04 2017 10:09:57 EDT Ventricular Rate:  102 PR Interval:  140 QRS Duration: 72 QT Interval:  316 QTC Calculation: 411 R Axis:   91 Text Interpretation:  Sinus tachycardia with Possible Premature atrial complexes with Abberant conduction Rightward axis ST & T wave abnormality, consider inferior ischemia Abnormal ECG Since last EKG , non specific diffuse St changes, possibly rate related Confirmed by Duffy Bruce [phone]) on 02/04/2017 11:09:43 AM        Radiology Dg Chest 2 View  Result Date: 02/04/2017 CLINICAL DATA:  61 year old presenting with a 2 day history of right-sided chest pain upon deep inspiration. EXAM: CHEST  2 VIEW COMPARISON:  09/23/2016, 05/24/2016 and earlier, including CT chest 10/08/2015. FINDINGS: Cardiac silhouette normal in size, unchanged. Minimal thoracic aortic arch atherosclerosis, unchanged. Hilar and mediastinal contours otherwise unremarkable. Confluent airspace consolidation involving the inferior right upper. Possible small right pleural effusion. Linear scarring in the left lower lobe. Lungs otherwise clear. Pulmonary vascularity normal. Degenerative changes involving the thoracic and upper lumbar spine. IMPRESSION: Acute dense segmental pneumonia involving the inferior right upper lobe and possible small right pleural effusion. Given the findings on a prior CT from February, 2017, mucous plugging with post-obstructive pneumonia is suspected. Electronically Signed   By: Evangeline Dakin M.D.   On: 02/04/2017 11:55    Procedures Procedures (including critical care time)  Medications Ordered in ED Medications  acetaminophen (TYLENOL) tablet 650 mg (not administered)   Initial Impression / Assessment and Plan / ED Course  I have reviewed the triage vital signs and the nursing notes.  Pertinent labs & imaging results that were available during my care of the patient were reviewed by me and considered in my medical decision making (see chart for details).  Final Clinical Impressions(s) / ED Diagnoses  {I have reviewed and evaluated the relevant laboratory values. {I have reviewed and evaluated the relevant imaging studies. {I have interpreted the relevant EKG. {I have reviewed the relevant previous healthcare records.  {I obtained HPI from historian. {Patient discussed with supervising physician.  ED Course:  Assessment: Pt is a 61 y.o. female with hx HTN, HLD who presents with right sided chest pain with  onset last night. Occurred at rest. Constant. Worse with inspiration. No N/V. No diaphoresis. Hx same in past with Pneumonia. No hx ACS. No hx DVT/PE. On exam, pt in NAD. Nontoxic/nonseptic appearing. VS with slight tachycardia. Temp 100.39F. Lungs CTA.Abdomen nontender soft. CBC  with Leukocytosis 26.5. BMP with potassium 2.9. Given Kdur as well as IV Potassium. iStat lactic 1.18. CXR shows right upper lobe Pneumonia with small right pleural effusion. Blood cultures drawn. Given Azithro/Rocephin in ED. Plan is to Admit to medicine.  Disposition/Plan:   Admit Pt acknowledges and agrees with plan  Supervising Physician Duffy Bruce, MD  Final diagnoses:  Community acquired pneumonia of right upper lobe of lung [facility])    New Prescriptions New Prescriptions   No medications on file     Shary Decamp, Hershal Coria 02/04/17 1325    Duffy Bruce, MD 02/05/17 1325

## 2017-02-04 NOTE — Progress Notes (Signed)
Angel Hebert is a 61 y.o. female patient admitted from ED awake, alert - oriented  X 4 - no acute distress noted.   IV in place, occlusive dsg intact without redness. Patient reported 3 water like bowel movement, provider notified.   Orientation to room, and floor completed with information packet given to patient/family.  Patient declined safety video at this time.  Admission INP armband ID verified with patient/family, and in place.   SR up x 2, fall assessment complete, with patient and family able to verbalize understanding of risk associated with falls, and verbalized understanding to call nsg before up out of bed.  Call light within reach, patient able to voice, and demonstrate understanding.  Skin, clean-dry- intact without evidence of bruising, or skin tears.   No evidence of skin break down noted on exam.     Will cont to eval and treat per MD orders.  [name], RN 02/04/2017 4:38 PM

## 2017-02-05 ENCOUNTER — Telehealth: Payer: Self-pay

## 2017-02-05 ENCOUNTER — Observation Stay (HOSPITAL_COMMUNITY): Payer: Medicaid Other

## 2017-02-05 DIAGNOSIS — Z79899 Other long term (current) drug therapy: Secondary | ICD-10-CM

## 2017-02-05 DIAGNOSIS — Z7951 Long term (current) use of inhaled steroids: Secondary | ICD-10-CM

## 2017-02-05 DIAGNOSIS — K219 Gastro-esophageal reflux disease without esophagitis: Secondary | ICD-10-CM

## 2017-02-05 DIAGNOSIS — E785 Hyperlipidemia, unspecified: Secondary | ICD-10-CM

## 2017-02-05 DIAGNOSIS — Z7982 Long term (current) use of aspirin: Secondary | ICD-10-CM

## 2017-02-05 DIAGNOSIS — J45909 Unspecified asthma, uncomplicated: Secondary | ICD-10-CM | POA: Diagnosis not present

## 2017-02-05 DIAGNOSIS — J189 Pneumonia, unspecified organism: Secondary | ICD-10-CM | POA: Diagnosis not present

## 2017-02-05 DIAGNOSIS — I10 Essential (primary) hypertension: Secondary | ICD-10-CM | POA: Diagnosis not present

## 2017-02-05 DIAGNOSIS — E876 Hypokalemia: Secondary | ICD-10-CM

## 2017-02-05 LAB — CBC
HEMATOCRIT: 26.5 % — AB (ref 36.0–46.0)
Hemoglobin: 8.4 g/dL — ABNORMAL LOW (ref 12.0–15.0)
MCH: 26.2 pg (ref 26.0–34.0)
MCHC: 31.7 g/dL (ref 30.0–36.0)
MCV: 82.6 fL (ref 78.0–100.0)
Platelets: 547 10*3/uL — ABNORMAL HIGH (ref 150–400)
RBC: 3.21 MIL/uL — ABNORMAL LOW (ref 3.87–5.11)
RDW: 19 % — AB (ref 11.5–15.5)
WBC: 22.4 10*3/uL — AB (ref 4.0–10.5)

## 2017-02-05 LAB — BASIC METABOLIC PANEL
Anion gap: 13 (ref 5–15)
BUN: 7 mg/dL (ref 6–20)
CHLORIDE: 100 mmol/L — AB (ref 101–111)
CO2: 19 mmol/L — AB (ref 22–32)
Calcium: 8.8 mg/dL — ABNORMAL LOW (ref 8.9–10.3)
Creatinine, Ser: 0.65 mg/dL (ref 0.44–1.00)
GFR calc Af Amer: >60 mL/min (ref >60)
GFR calc non Af Amer: >60 mL/min (ref >60)
Glucose, Bld: 98 mg/dL (ref 65–99)
POTASSIUM: 3.3 mmol/L — AB (ref 3.5–5.1)
SODIUM: 132 mmol/L — AB (ref 135–145)

## 2017-02-05 LAB — STREP PNEUMONIAE URINARY ANTIGEN: Strep Pneumo Urinary Antigen: NEGATIVE

## 2017-02-05 LAB — HIV ANTIBODY (ROUTINE TESTING W REFLEX): HIV Screen 4th Generation wRfx: NONREACTIVE

## 2017-02-05 LAB — D-DIMER, QUANTITATIVE: D-Dimer, Quant: 1.39 ug{FEU}/mL — ABNORMAL HIGH (ref 0.00–0.50)

## 2017-02-05 LAB — C DIFFICILE QUICK SCREEN W PCR REFLEX
C Diff antigen: NEGATIVE
C Diff interpretation: NOT DETECTED
C Diff toxin: NEGATIVE

## 2017-02-05 MED ORDER — AZITHROMYCIN 250 MG PO TABS: 250.0000 mg | ORAL_TABLET | Freq: Every day | ORAL | 0 refills | Status: AC

## 2017-02-05 MED ORDER — POTASSIUM CHLORIDE 20 MEQ/15ML (10%) PO SOLN
40.0000 meq | Freq: Once | ORAL | Status: AC
  Administered 2017-02-05: 40 meq via ORAL
  Filled 2017-02-05: qty 30

## 2017-02-05 MED ORDER — IOPAMIDOL (ISOVUE-370) INJECTION 76%
INTRAVENOUS | Status: AC
  Administered 2017-02-05: 100 mL
  Filled 2017-02-05: qty 100

## 2017-02-05 MED ORDER — CEFDINIR 300 MG PO CAPS: 300.0000 mg | ORAL_CAPSULE | Freq: Two times a day (BID) | ORAL | 0 refills | Status: AC

## 2017-02-05 MED ORDER — WHITE PETROLATUM GEL
Status: AC
  Administered 2017-02-05: 10:00:00
  Filled 2017-02-05: qty 1

## 2017-02-05 MED ORDER — ASPIRIN EC 325 MG PO TBEC
325.0000 mg | DELAYED_RELEASE_TABLET | Freq: Every day | ORAL | Status: DC
  Administered 2017-02-05: 325 mg via ORAL
  Filled 2017-02-05: qty 1

## 2017-02-05 NOTE — Progress Notes (Signed)
Subjective:  Comfortable and says she does not have any SOB and only mild chest pain and mild cough today. She denies any lightheadedness, abdominal pain, and or new episodes of diarrhea.   We clarified today about her L knee replacement around 4 months ago.  She now says that she did complete a 3 week course of Xarelto before transitioning to ASA which she still takes.  She is very mobile at home and has remained very active even here in the hospital.    Objective:  Vital signs in last 24 hours: Vitals:   02/04/17 1649 02/04/17 2141 02/05/17 0610 02/05/17 0749  BP: 138/67 (!) 142/76 129/71   Pulse: 98 (!) 121 75   Resp: 18 20 18    Temp: (!) 100.5 F (38.1 C) 99.8 F (37.7 C) 98.5 F (36.9 C)   TempSrc: Oral Oral Oral   SpO2: 98% 98% 99% 98%  Weight: 74.1 kg (163 lb 5.8 oz)     Height: 5\' 7"  (1.702 m)      Weight change:   Intake/Output Summary (Last 24 hours) at 02/05/17 1024 Last data filed at 02/05/17 0925  Gross per 24 hour  Intake              360 ml  Output              100 ml  Net              260 ml   Gen: Well-appearing in NAD.  Sitting up comfortably in bed. Neuro:  Alert and oriented x3.   HEENT: NCAT. EOMI. Moist mucous membranes.  CV: RRR with no murmurs, rubs, or gallops. Pulm:  Normal work of breathing. CTAB, no wheezes or rhonchi. Abdomen: Soft, non-tender to palpation in four quadrants.   Extremities: No skin lesions appreciated.  No LE edema.    Assessment/Plan:  Principal Problem:   CAP (community acquired pneumonia) Active Problems:   Essential hypertension   Allergic rhinitis   Asthma   GERD  Ms. Cotta is a 61 yo woman with history of PNA 1 year ago, HTN, HLD, asthma, allergic rhinitis, GERD, knee OA s/p left total knee replacement with CAP.   CAP - Despite her presentation with pleuritic chest pain, we still believe that PNA is the most likely diagnosis at this time. PE was also considered given presenting complaint of pleuritic  chest pain w/o clear infectious prodrome, but fever, cough, SOB, right sided pleuritic chest pain, sick contact, and lobular consolidation on CXR are more consistent with community-acquired PNA. Additionally she was anticoagulated after knee replacement now 4 mo ago and is very mobile at home.   Her cough is non-productive and lung exam seems less consistent with mucous plugging as suggested by CXR.  WBC of 26.5 on admisison, predominately PMNs. Given mild hyponatremia and diarrheal illness, atypical PNA including legionella was considered, but she denies exposures and her symptoms are extremely mild. Current abx would cover legionella as well.  Mild hyponatremia likely represented low volume status. Started with routine CAP coverage of ceftriaxone and azithromycin. -ceftriaxone, azithromycin for CAP coverage while inpatient - Strep pneumo antigen >> Negative - f/u urine legionella antigen - f/u BC x2 - Check D-Dimer - if negative will plan for d/c today; if positive will obtain CTA chest  Asthma - On advair and montelukast with PRN albuterol as outpatient.  - continue outpatient montelukast - continue breo  - albuterol PRN q6 for wheezing or SOB  Hypokalemia - Potassium  of 3.3 - replete with 40 mEq oral K  HTN - On irbesartan 150 mg and HCTZ 12.5 qday as outpatient. - continue outpatient regimen  GERD - continue outpatient pantoprazole 20 mg  HLD  - continue outpatient atorvastatin 10 mg qday  FEN - normal diet  VTE proph - Lovenox  Code status - DNR   LOS: 0 days   Doug Sou, Medical Student 02/05/2017, 10:24 AM  Attestation for Student Documentation:  I personally was present and performed or re-performed the history, physical exam and medical decision-making activities of this service and have verified that the service and findings are accurately documented in the student's note.  Patient feeling better. Given history, we will obtain a D-dimer to assess for PE.  If positive, we will evaluate further with a CTA chest. If negative, I feel confident that her acute illness is secondary to a RUL PNA and she will be stable for discharge today on oral antibiotics if O2 saturations are appropriate with ambulation. Would continue with oral Azithromycin and consider transitioning Ceftriaxone to oral Cefdinir.  Zada Finders, MD 02/05/2017, 10:55 AM

## 2017-02-05 NOTE — Progress Notes (Signed)
NURSING PROGRESS NOTE  Angel Hebert [id number] Discharge Data: 02/05/2017 6:48 PM Attending Provider: Axel Filler, * CBU:LAGT, Charlane Ferretti, MD     [name] to be D/C'd Home per MD order.  Discussed with the patient the After Visit Summary and all questions fully answered. All IV's discontinued with no bleeding noted. All belongings returned to patient for patient to take home.   Last Vital Signs:  Blood pressure 126/70, pulse (!) 102, temperature 98.2 F (36.8 C), temperature source Oral, resp. rate 16, height 5\' 7"  (1.702 m), weight 74.1 kg (163 lb 5.8 oz), SpO2 100 %.  Discharge Medication List Allergies as of 02/05/2017      Reactions   No Known Allergies       Medication List    TAKE these medications   albuterol (2.5 MG/3ML) 0.083% nebulizer solution Commonly known as:  PROVENTIL Take 3 mLs (2.5 mg total) by nebulization every 6 (six) hours as needed for wheezing or shortness of breath.   PROAIR HFA 108 (90 Base) MCG/ACT inhaler Generic drug:  albuterol Inhale 2 puffs into the lungs every 6 (six) hours as needed for wheezing or shortness of breath.   ALL DAY ALLERGY 10 MG tablet Generic drug:  cetirizine TAKE 1 TABLET BY MOUTH DAILY.   aspirin EC 325 MG tablet Begin taking aspirin on 10/27/2016   atorvastatin 10 MG tablet Commonly known as:  LIPITOR TAKE 1 TABLET BY MOUTH DAILY AT 6 PM FOR CHOLESTEROL   azithromycin 250 MG tablet Commonly known as:  ZITHROMAX Take 1 tablet (250 mg total) by mouth daily. Start taking on:  02/06/2017   cefdinir 300 MG capsule Commonly known as:  OMNICEF Take 1 capsule (300 mg total) by mouth 2 (two) times daily. Start taking on:  02/06/2017   fluticasone 50 MCG/ACT nasal spray Commonly known as:  FLONASE Place 2 sprays into both nostrils daily.   Fluticasone-Salmeterol 500-50 MCG/DOSE Aepb Commonly known as:  ADVAIR Inhale 1 puff into the lungs every 12 (twelve) hours.   hydrochlorothiazide 25 MG  tablet Commonly known as:  HYDRODIURIL TAKE 1/2 TABLET BY MOUTH DAILY FOR BLOOD PRESSURE   irbesartan 150 MG tablet Commonly known as:  AVAPRO TAKE 1 TABLET BY MOUTH DAILY AFTER BREAKFAST FOR BLOOD PRESSURE   montelukast 10 MG tablet Commonly known as:  SINGULAIR Take 1 tablet (10 mg total) by mouth at bedtime.   naproxen 500 MG tablet Commonly known as:  NAPROSYN Take 1 tablet (500 mg total) by mouth 2 (two) times daily with a meal.   pantoprazole 20 MG tablet Commonly known as:  PROTONIX Take 1 tablet (20 mg total) by mouth daily.   traZODone 50 MG tablet Commonly known as:  DESYREL Take 1 tablet (50 mg total) by mouth at bedtime.

## 2017-02-05 NOTE — Progress Notes (Signed)
CM made pt aware: [place]   [phone] [place] [id number]    Next Steps: Call on 02/07/2017    Instructions: Please call on 02/07/2017 @ 8:30 am to arrange post hospital follow up with Dr. Jarold Song for Monday or Tuesday of next week.     Pt very appreciative and stated will follow up with [facility] for appointment. Whitman Hero RN,BSN,CM [phone]

## 2017-02-05 NOTE — Discharge Summary (Signed)
Name: Angel Hebert MRN: [id number] DOB: 1955/12/02 61 y.o. PCP: Arnoldo Morale, MD  Date of Admission: 02/04/2017 10:18 AM Date of Discharge: 02/05/2017 Attending Physician: Axel Filler, MD  Discharge Diagnosis: 1. Community Acquired Pnuemonia  Principal Problem:   CAP (community acquired pneumonia) Active Problems:   Essential hypertension   Allergic rhinitis   Asthma   GERD   Discharge Medications: Allergies as of 02/05/2017      Reactions   No Known Allergies       Medication List    TAKE these medications   albuterol (2.5 MG/3ML) 0.083% nebulizer solution Commonly known as:  PROVENTIL Take 3 mLs (2.5 mg total) by nebulization every 6 (six) hours as needed for wheezing or shortness of breath.   PROAIR HFA 108 (90 Base) MCG/ACT inhaler Generic drug:  albuterol Inhale 2 puffs into the lungs every 6 (six) hours as needed for wheezing or shortness of breath.   ALL DAY ALLERGY 10 MG tablet Generic drug:  cetirizine TAKE 1 TABLET BY MOUTH DAILY.   aspirin EC 325 MG tablet Begin taking aspirin on 10/27/2016   atorvastatin 10 MG tablet Commonly known as:  LIPITOR TAKE 1 TABLET BY MOUTH DAILY AT 6 PM FOR CHOLESTEROL   azithromycin 250 MG tablet Commonly known as:  ZITHROMAX Take 1 tablet (250 mg total) by mouth daily.   cefdinir 300 MG capsule Commonly known as:  OMNICEF Take 1 capsule (300 mg total) by mouth 2 (two) times daily.   fluticasone 50 MCG/ACT nasal spray Commonly known as:  FLONASE Place 2 sprays into both nostrils daily.   Fluticasone-Salmeterol 500-50 MCG/DOSE Aepb Commonly known as:  ADVAIR Inhale 1 puff into the lungs every 12 (twelve) hours.   hydrochlorothiazide 25 MG tablet Commonly known as:  HYDRODIURIL TAKE 1/2 TABLET BY MOUTH DAILY FOR BLOOD PRESSURE   irbesartan 150 MG tablet Commonly known as:  AVAPRO TAKE 1 TABLET BY MOUTH DAILY AFTER BREAKFAST FOR BLOOD PRESSURE   montelukast 10 MG tablet Commonly known as:   SINGULAIR Take 1 tablet (10 mg total) by mouth at bedtime.   naproxen 500 MG tablet Commonly known as:  NAPROSYN Take 1 tablet (500 mg total) by mouth 2 (two) times daily with a meal.   pantoprazole 20 MG tablet Commonly known as:  PROTONIX Take 1 tablet (20 mg total) by mouth daily.   traZODone 50 MG tablet Commonly known as:  DESYREL Take 1 tablet (50 mg total) by mouth at bedtime.       Disposition and follow-up:   Ms.Deon Milius was discharged from [facility] in good condition.  At the hospital follow up visit please address:  1.  Community acquired pneumonia   2.  Labs / imaging needed: Chest XR in 6 weeks, BMET for potassium check  3. Labs to follow up:  Legionella urine antigen  Follow-up Appointments: Follow-up Information    [place]. Go on 02/07/2017.   Why:   Please go to [facility] on 02/07/2017  at 10:30 am  for post hospital follow up appointment Contact information: [place] [phone]          Hospital Course by problem list: Principal Problem:   CAP (community acquired pneumonia) Active Problems:   Essential hypertension   Allergic rhinitis   Asthma   GERD   1. Community Acquired Pneumonia Patient presented with a 2 day history of R sided pleuritic chest pain, fever, cough,  SOB, and diarrhea without definitive infectious prodrome, but sick contact in her roommate at home.  CXR in the ED showed RUL consolidation and she was started with CAP coverage on ceftriaxone and azithromycin and given a 1L NS bolus.  Given her acute onset pleuritic chest pain, D-dimer was obtained that was positive.  Subsequent CTA was negative for PE.  Additionally given her mild hyponatremia and diarrheal illness, legionella PNA was considered and urine antigen test was obtained that had not resulted at the time of discharge.  C. Diff testing was negative. She received  azithromycin and ceftriaxone for 2 days IV before being prescribed PO azithromycin and cefdinir to complete a 5 day course.    Asthma - She was continued on outpatient meds of montelukast with PRN albuterol as outpatient. Breo was substituted for advair during admission as this was on formulary.   Hypokalemia - She was admitted with a potassium of 2.9.  She received 10 mEq IV and 80 mEq oral K replacement on hospital day 1 and another 40 mEq oral K on hospital day 2 for a potassium of 3.3.     HTN - Continued outpatient regimen of irbesartan 150 mg and HCTZ 12.5 qday.  GERD - Continued outpatient pantoprazole 20 mg  HLD - Continued outpatient atorvastatin 10 mg qday   Discharge Vitals:   BP 126/70 (BP Location: Left Arm)   Pulse (!) 102   Temp 98.2 F (36.8 C) (Oral)   Resp 16   Ht 5\' 7"  (1.702 m)   Wt 163 lb 5.8 oz (74.1 kg)   SpO2 100%   BMI 25.59 kg/m   Pertinent Labs, Studies, and Procedures:   CXR  IMPRESSION: Acute dense segmental pneumonia involving the inferior right upper lobe and possible small right pleural effusion. Given the findings on a prior CT from February, 2017, mucous plugging with post-obstructive pneumonia is suspected.  D-dimer - Elevated 1.23  CTA IMPRESSION: 1. Posterior right upper lobe pneumonia. 2. Negative for acute PE or thoracic aortic dissection. 3. Trace right pleural effusion.   Discharge Instructions: Discharge Instructions    Call MD for:  difficulty breathing, headache or visual disturbances    Complete by:  As directed    Call MD for:  persistant dizziness or light-headedness    Complete by:  As directed    Call MD for:  persistant nausea and vomiting    Complete by:  As directed    Increase activity slowly    Complete by:  As directed     Please complete your antibiotics as prescribed and follow up with your primary care provider in the next 1-2 weeks. You may need a repeat chest xray in about 6 weeks to see if the  pneumonia has improved.  Signed: Zada Finders, MD 02/06/2017, 6:52 AM

## 2017-02-05 NOTE — Telephone Encounter (Signed)
An appointment is now available for hospital follow up on 02/07/17 @ 1030. This information was provided to Whitman Hero, RN CM who will update the AVS.

## 2017-02-05 NOTE — Telephone Encounter (Signed)
Call received from Whitman Hero, requesting a hospital follow up appointment for the patient at [facility]. Informed her that there are currently no hospital follow up appointments available and the patient will need to call at the end of this week to check for availability.

## 2017-02-05 NOTE — Discharge Instructions (Signed)
Ms. Jo, Booze were in the hospital for a community acquired pneumonia.  Please complete your antibiotics as prescribed and follow up with your primary care provider in the next 1-2 weeks. You may need a repeat chest xray in about 6 weeks to see if the pneumonia has improved.    Community-Acquired Pneumonia, Adult Pneumonia is an infection of the lungs. One type of pneumonia can happen while a person is in a hospital. A different type can happen when a person is not in a hospital (community-acquired pneumonia). It is easy for this kind to spread from person to person. It can spread to you if you breathe near an infected person who coughs or sneezes. Some symptoms include:  A dry cough.  A wet (productive) cough.  Fever.  Sweating.  Chest pain.  Follow these instructions at home:  Take over-the-counter and prescription medicines only as told by your doctor. ? Only take cough medicine if you are losing sleep. ? If you were prescribed an antibiotic medicine, take it as told by your doctor. Do not stop taking the antibiotic even if you start to feel better.  Sleep with your head and neck raised (elevated). You can do this by putting a few pillows under your head, or you can sleep in a recliner.  Do not use tobacco products. These include cigarettes, chewing tobacco, and e-cigarettes. If you need help quitting, ask your doctor.  Drink enough water to keep your pee (urine) clear or pale yellow. A shot (vaccine) can help prevent pneumonia. Shots are often suggested for:  People older than 60 years of age.  People older than 60 years of age: ? Who are having cancer treatment. ? Who have long-term (chronic) lung disease. ? Who have problems with their body's defense system (immune system).  You may also prevent pneumonia if you take these actions:  Get the flu (influenza) shot every year.  Go to the dentist as often as told.  Wash your hands often. If soap and water are  not available, use hand sanitizer.  Contact a doctor if:  You have a fever.  You lose sleep because your cough medicine does not help. Get help right away if:  You are short of breath and it gets worse.  You have more chest pain.  Your sickness gets worse. This is very serious if: ? You are an older adult. ? Your body's defense system is weak.  You cough up blood. This information is not intended to replace advice given to you by your health care provider. Make sure you discuss any questions you have with your health care provider. Document Released: 02/05/2008 Document Revised: 01/25/2016 Document Reviewed: 12/14/2014 Elsevier Interactive Patient Education  [employer].

## 2017-02-05 NOTE — Progress Notes (Signed)
Nurse tech walked patient in the hall. She was 100% on room air while ambulating.

## 2017-02-06 ENCOUNTER — Telehealth (INDEPENDENT_AMBULATORY_CARE_PROVIDER_SITE_OTHER): Payer: Self-pay | Admitting: Orthopedic Surgery

## 2017-02-06 LAB — LEGIONELLA PNEUMOPHILA SEROGP 1 UR AG: L. PNEUMOPHILA SEROGP 1 UR AG: NEGATIVE

## 2017-02-06 NOTE — Telephone Encounter (Signed)
Returned call to patient left message to return call  [phone]

## 2017-02-06 NOTE — Progress Notes (Signed)
Patient ID: Angel Hebert, female   DOB: [birth year], 61 y.o.   MRN: [id number]     Angel Hebert, is a 61 y.o. female  FGH:[id number]  CVE:[id number]  DOB - 1956-06-22  Subjective:  Chief Complaint and HPI: Angel Hebert is a 61 y.o. female here today for a follow up visit After being hospitalized 6/5-02/05/2017 for CAP.  Today she is feeling much better other than having some nausea without vomiting.  She is still taking antibiotics.  Her breathing and cough is much improved.  Diarrhea is resolved.  Legionella testing was negative.  No f/c.     From hospital notes: 1. Community Acquired Pneumonia Patient presented with a 2 day history of R sided pleuritic chest pain, fever, cough, SOB, and diarrhea without definitive infectious prodrome, but sick contact in her roommate at home.  CXR in the ED showed RUL consolidation and she was started with CAP coverage on ceftriaxone and azithromycin and given a 1L NS bolus.  Given her acute onset pleuritic chest pain, D-dimer was obtained that was positive.  Subsequent CTA was negative for PE.  Additionally given her mild hyponatremia and diarrheal illness, legionella PNA was considered and urine antigen test was obtained that had not resulted at the time of discharge.  C. Diff testing was negative. She received azithromycin and ceftriaxone for 2 days IV before being prescribed PO azithromycin and cefdinir to complete a 5 day course.    Asthma - She was continued on outpatient meds of montelukast with PRN albuterol as outpatient. Breo was substituted for advair during admission as this was on formulary.   Hypokalemia - She was admitted with a potassium of 2.9.  She received 10 mEq IV and 80 mEq oral K replacement on hospital day 1 and another 40 mEq oral K on hospital day 2 for a potassium of 3.3.     HTN - Continued outpatient regimen of irbesartan 150 mg and HCTZ 12.5 qday.  GERD - Continued outpatient pantoprazole 20 mg  HLD -  Continued outpatient atorvastatin  .   ED/Hospital notes reviewed.    ROS:   Constitutional:  No f/c, No night sweats, No unexplained weight loss. EENT:  No vision changes, No blurry vision, No hearing changes. No mouth, throat, or ear problems.  Respiratory: cough is improved, No SOB Cardiac: No CP, no palpitations GI:  No abd pain, No N/V/D. GU: No Urinary s/sx Musculoskeletal: No joint pain Neuro: No headache, no dizziness, no motor weakness.  Skin: No rash Endocrine:  No polydipsia. No polyuria.  Psych: Denies SI/HI  No problems updated.  ALLERGIES: Allergies  Allergen Reactions  . No Known Allergies     PAST MEDICAL HISTORY: Past Medical History:  Diagnosis Date  . Arthritis   . Asthma    last year in December  . Dyspnea   . GERD (gastroesophageal reflux disease)   . High cholesterol   . Hypertension   . Pneumonia 01/2017    MEDICATIONS AT HOME: Prior to Admission medications   Medication Sig Start Date End Date Taking? Authorizing Provider  albuterol (PROVENTIL) (2.5 MG/3ML) 0.083% nebulizer solution Take 3 mLs (2.5 mg total) by nebulization every 6 (six) hours as needed for wheezing or shortness of breath. 11/27/16   Arnoldo Morale, MD  ALL DAY ALLERGY 10 MG tablet TAKE 1 TABLET BY MOUTH DAILY. 01/28/17   Arnoldo Morale, MD  aspirin EC 325 MG tablet Begin taking aspirin on 10/27/2016 10/14/16   Meredith Pel, MD  atorvastatin (LIPITOR)  10 MG tablet TAKE 1 TABLET BY MOUTH DAILY AT 6 PM FOR CHOLESTEROL 11/27/16   Arnoldo Morale, MD  azithromycin (ZITHROMAX) 250 MG tablet Take 1 tablet (250 mg total) by mouth daily. 02/06/17 02/09/17  Zada Finders, MD  cefdinir (OMNICEF) 300 MG capsule Take 1 capsule (300 mg total) by mouth 2 (two) times daily. 02/06/17 02/09/17  Zada Finders, MD  fluticasone (FLONASE) 50 MCG/ACT nasal spray Place 2 sprays into both nostrils daily. 11/27/16   Arnoldo Morale, MD  Fluticasone-Salmeterol (ADVAIR) 500-50 MCG/DOSE AEPB Inhale 1 puff into the  lungs every 12 (twelve) hours. 11/27/16   Arnoldo Morale, MD  hydrochlorothiazide (HYDRODIURIL) 25 MG tablet TAKE 1/2 TABLET BY MOUTH DAILY FOR BLOOD PRESSURE 11/27/16   Arnoldo Morale, MD  irbesartan (AVAPRO) 150 MG tablet TAKE 1 TABLET BY MOUTH DAILY AFTER BREAKFAST FOR BLOOD PRESSURE 11/27/16   Arnoldo Morale, MD  montelukast (SINGULAIR) 10 MG tablet Take 1 tablet (10 mg total) by mouth at bedtime. 11/27/16   Arnoldo Morale, MD  naproxen (NAPROSYN) 500 MG tablet Take 1 tablet (500 mg total) by mouth 2 (two) times daily with a meal. 11/27/16   Arnoldo Morale, MD  ondansetron (ZOFRAN-ODT) 8 MG disintegrating tablet Take 1 tablet (8 mg total) by mouth every 8 (eight) hours as needed for nausea or vomiting. 02/07/17   Argentina Donovan, PA-C  pantoprazole (PROTONIX) 20 MG tablet Take 1 tablet (20 mg total) by mouth daily. 11/27/16   Arnoldo Morale, MD  PROAIR HFA 108 (90 Base) MCG/ACT inhaler Inhale 2 puffs into the lungs every 6 (six) hours as needed for wheezing or shortness of breath. 11/27/16   Arnoldo Morale, MD  traZODone (DESYREL) 50 MG tablet Take 1 tablet (50 mg total) by mouth at bedtime. 11/27/16   Arnoldo Morale, MD     Objective:  EXAM:   Vitals:   02/07/17 1100  BP: 98/63  Pulse: 88  Resp: 18  Temp: 97.7 F (36.5 C)  TempSrc: Oral  SpO2: 100%  Weight: 163 lb 6.4 oz (74.1 kg)  Height: 5\' 7"  (1.702 m)    General appearance : A&OX3. NAD. Non-toxic-appearing HEENT: Atraumatic and Normocephalic.  PERRLA. EOM intact.   Neck: supple, no JVD. No cervical lymphadenopathy. No thyromegaly Chest/Lungs:  Breathing-non-labored, Good air entry bilaterally, breath sounds normal without rales, rhonchi, or wheezing  CVS: S1 S2 regular, no murmurs, gallops, rubs  Extremities: Bilateral Lower Ext shows no edema, both legs are warm to touch with = pulse throughout Neurology:  CN II-XII grossly intact, Non focal.   Psych:  TP linear. J/I WNL. Normal speech. Appropriate eye contact and affect.  Skin:  No  Rash  Data Review Lab Results  Component Value Date   HGBA1C 5.3 10/26/2015     Assessment & Plan   1. Essential hypertension Controlled.  Continue current regimen. - Basic metabolic panel  2. Community acquired pneumonia, unspecified laterality-hospital f/up Improving; advised deep breathing - CBC with Differential/Platelet - DG Chest 2 View; Future-recommended at 6 weeks for f/up of CAP Patient went for xray today-it was supposed to be 6 weeks from now.  Shows increasing RML consolidation-finish Zpack.  3. Hyponatremia - Basic metabolic panel  4. Hypokalemia - Basic metabolic panel  5. Nausea - ondansetron (ZOFRAN-ODT) 8 MG disintegrating tablet; Take 1 tablet (8 mg total) by mouth every 8 (eight) hours as needed for nausea or vomiting.  Dispense: 20 tablet; Refill: 0  Patient have been counseled extensively about nutrition and exercise  Return in  about 2 weeks (around 02/21/2017) for Dr Jarold Song; f/up pneumonia/htn/nausea/abnormal labs.  The patient was given clear instructions to go to ER or return to medical center if symptoms don't improve, worsen or new problems develop. The patient verbalized understanding. The patient was told to call to get lab results if they haven't heard anything in the next week.     Freeman Caldron, PA-C [facility] [place], [place]   02/07/2017, 11:10 AM

## 2017-02-07 ENCOUNTER — Ambulatory Visit (HOSPITAL_COMMUNITY)
Admission: RE | Admit: 2017-02-07 | Discharge: 2017-02-07 | Disposition: A | Discharge status: Home / Self Care | Payer: Medicaid Other | Source: Ambulatory Visit | Attending: Physician Assistant | Admitting: Physician Assistant

## 2017-02-07 ENCOUNTER — Encounter (HOSPITAL_COMMUNITY): Payer: Self-pay | Admitting: *Deleted

## 2017-02-07 ENCOUNTER — Emergency Department (HOSPITAL_COMMUNITY)
Admission: EM | Admit: 2017-02-07 | Discharge: 2017-02-07 | Disposition: A | Discharge status: Home / Self Care | Payer: Medicaid Other | Attending: Emergency Medicine | Admitting: Emergency Medicine

## 2017-02-07 ENCOUNTER — Ambulatory Visit: Payer: Medicaid Other | Attending: Family Medicine | Admitting: Physician Assistant

## 2017-02-07 VITALS — BP 98/63 | HR 88 | Temp 97.7°F | Resp 18 | Ht 67.0 in | Wt 163.4 lb

## 2017-02-07 DIAGNOSIS — J189 Pneumonia, unspecified organism: Secondary | ICD-10-CM | POA: Insufficient documentation

## 2017-02-07 DIAGNOSIS — R918 Other nonspecific abnormal finding of lung field: Secondary | ICD-10-CM | POA: Diagnosis not present

## 2017-02-07 DIAGNOSIS — J45909 Unspecified asthma, uncomplicated: Secondary | ICD-10-CM | POA: Insufficient documentation

## 2017-02-07 DIAGNOSIS — Z5321 Procedure and treatment not carried out due to patient leaving prior to being seen by health care provider: Secondary | ICD-10-CM | POA: Diagnosis not present

## 2017-02-07 DIAGNOSIS — Z96652 Presence of left artificial knee joint: Secondary | ICD-10-CM | POA: Diagnosis not present

## 2017-02-07 DIAGNOSIS — K219 Gastro-esophageal reflux disease without esophagitis: Secondary | ICD-10-CM | POA: Insufficient documentation

## 2017-02-07 DIAGNOSIS — E871 Hypo-osmolality and hyponatremia: Secondary | ICD-10-CM | POA: Diagnosis not present

## 2017-02-07 DIAGNOSIS — R110 Nausea: Secondary | ICD-10-CM

## 2017-02-07 DIAGNOSIS — I10 Essential (primary) hypertension: Secondary | ICD-10-CM | POA: Insufficient documentation

## 2017-02-07 DIAGNOSIS — Z09 Encounter for follow-up examination after completed treatment for conditions other than malignant neoplasm: Secondary | ICD-10-CM | POA: Insufficient documentation

## 2017-02-07 DIAGNOSIS — E876 Hypokalemia: Secondary | ICD-10-CM

## 2017-02-07 DIAGNOSIS — Z79899 Other long term (current) drug therapy: Secondary | ICD-10-CM | POA: Insufficient documentation

## 2017-02-07 DIAGNOSIS — Z7982 Long term (current) use of aspirin: Secondary | ICD-10-CM | POA: Insufficient documentation

## 2017-02-07 DIAGNOSIS — E7800 Pure hypercholesterolemia, unspecified: Secondary | ICD-10-CM | POA: Insufficient documentation

## 2017-02-07 MED ORDER — ONDANSETRON 8 MG PO TBDP: 8.0000 mg | ORAL_TABLET | Freq: Three times a day (TID) | ORAL | 0 refills | Status: DC | PRN

## 2017-02-07 NOTE — Progress Notes (Signed)
Patient is here for h f/up  Patient complains stomach being nauseas

## 2017-02-07 NOTE — ED Triage Notes (Signed)
Pt sent her for chest xray by [facility] for PCP to check status post admission for pneumonia, pt reports being discharged this Weds, denies SOB, cough & pain, A&O x4

## 2017-02-09 LAB — CULTURE, BLOOD (ROUTINE X 2)
CULTURE: NO GROWTH
Culture: NO GROWTH
SPECIAL REQUESTS: ADEQUATE
Special Requests: ADEQUATE

## 2017-02-13 ENCOUNTER — Telehealth: Payer: Self-pay | Admitting: *Deleted

## 2017-02-13 NOTE — Telephone Encounter (Signed)
-----   Message from Argentina Donovan, [place] sent at 02/07/2017  3:52 PM EDT ----- Please call patient.  The repeat xray was supposed to be done at the end of July.  This xray is too early to show significant change.  Follow up with Dr Jarold Song as planned and we will plan to do another xray in about 6 weeks. Finish antibiotics.  Thanks, Freeman Caldron, PA-C

## 2017-02-13 NOTE — Telephone Encounter (Signed)
Patient verified DOB Patient is aware of xray being too seen and a future order being placed for 6 weeks out. Patient advised to return to radiology at the end of July and repeat xray. Patient expressed her understanding and had no further questions.

## 2017-02-17 ENCOUNTER — Telehealth (INDEPENDENT_AMBULATORY_CARE_PROVIDER_SITE_OTHER): Payer: Self-pay

## 2017-02-17 ENCOUNTER — Telehealth (INDEPENDENT_AMBULATORY_CARE_PROVIDER_SITE_OTHER): Payer: Self-pay | Admitting: Orthopedic Surgery

## 2017-02-17 MED ORDER — TRAMADOL HCL 50 MG PO TABS: 50.0000 mg | ORAL_TABLET | Freq: Two times a day (BID) | ORAL | 0 refills | Status: DC | PRN

## 2017-02-17 NOTE — Telephone Encounter (Signed)
Ok to rf? 

## 2017-02-17 NOTE — Telephone Encounter (Signed)
rx called to pharmacy. Patient advised done.  

## 2017-02-17 NOTE — Telephone Encounter (Signed)
Advised that rx was called and left on pharmacy VM

## 2017-02-17 NOTE — Telephone Encounter (Signed)
Patient called saying that she spoke with [facility] and they said they never received the RX for the patient. CB # [phone]

## 2017-02-17 NOTE — Addendum Note (Signed)
Addended byLaurann Montana on: 02/17/2017 01:52 PM   Modules accepted: Orders

## 2017-02-17 NOTE — Telephone Encounter (Signed)
Patient would like a Rx refill on Tramadol.  Please Advise.

## 2017-02-17 NOTE — Telephone Encounter (Signed)
y

## 2017-03-03 ENCOUNTER — Ambulatory Visit: Payer: Medicaid Other | Attending: Family Medicine | Admitting: Family Medicine

## 2017-03-03 ENCOUNTER — Encounter: Payer: Self-pay | Admitting: Family Medicine

## 2017-03-03 VITALS — BP 100/60 | HR 97 | Temp 98.4°F | Resp 18 | Ht 67.0 in | Wt 167.0 lb

## 2017-03-03 DIAGNOSIS — E7800 Pure hypercholesterolemia, unspecified: Secondary | ICD-10-CM | POA: Diagnosis not present

## 2017-03-03 DIAGNOSIS — J322 Chronic ethmoidal sinusitis: Secondary | ICD-10-CM | POA: Insufficient documentation

## 2017-03-03 DIAGNOSIS — I10 Essential (primary) hypertension: Secondary | ICD-10-CM | POA: Insufficient documentation

## 2017-03-03 DIAGNOSIS — G4700 Insomnia, unspecified: Secondary | ICD-10-CM | POA: Insufficient documentation

## 2017-03-03 DIAGNOSIS — G4709 Other insomnia: Secondary | ICD-10-CM

## 2017-03-03 DIAGNOSIS — Z7982 Long term (current) use of aspirin: Secondary | ICD-10-CM | POA: Insufficient documentation

## 2017-03-03 DIAGNOSIS — M1712 Unilateral primary osteoarthritis, left knee: Secondary | ICD-10-CM

## 2017-03-03 DIAGNOSIS — E876 Hypokalemia: Secondary | ICD-10-CM | POA: Insufficient documentation

## 2017-03-03 DIAGNOSIS — J4520 Mild intermittent asthma, uncomplicated: Secondary | ICD-10-CM | POA: Insufficient documentation

## 2017-03-03 DIAGNOSIS — Z79899 Other long term (current) drug therapy: Secondary | ICD-10-CM | POA: Diagnosis not present

## 2017-03-03 DIAGNOSIS — Z96652 Presence of left artificial knee joint: Secondary | ICD-10-CM | POA: Insufficient documentation

## 2017-03-03 DIAGNOSIS — M170 Bilateral primary osteoarthritis of knee: Secondary | ICD-10-CM | POA: Insufficient documentation

## 2017-03-03 DIAGNOSIS — K219 Gastro-esophageal reflux disease without esophagitis: Secondary | ICD-10-CM | POA: Insufficient documentation

## 2017-03-03 DIAGNOSIS — D649 Anemia, unspecified: Secondary | ICD-10-CM | POA: Insufficient documentation

## 2017-03-03 MED ORDER — FLUTICASONE-SALMETEROL 500-50 MCG/DOSE IN AEPB: 1.0000 | INHALATION_SPRAY | Freq: Two times a day (BID) | RESPIRATORY_TRACT | 5 refills | Status: DC

## 2017-03-03 MED ORDER — ATORVASTATIN CALCIUM 10 MG PO TABS: ORAL_TABLET | ORAL | 5 refills | Status: DC

## 2017-03-03 MED ORDER — PROAIR HFA 108 (90 BASE) MCG/ACT IN AERS: 2.0000 | INHALATION_SPRAY | Freq: Four times a day (QID) | RESPIRATORY_TRACT | 5 refills | Status: DC | PRN

## 2017-03-03 MED ORDER — ALBUTEROL SULFATE (2.5 MG/3ML) 0.083% IN NEBU: 2.5000 mg | INHALATION_SOLUTION | Freq: Four times a day (QID) | RESPIRATORY_TRACT | 3 refills | Status: DC | PRN

## 2017-03-03 MED ORDER — TRAZODONE HCL 50 MG PO TABS: 50.0000 mg | ORAL_TABLET | Freq: Every day | ORAL | 5 refills | Status: DC

## 2017-03-03 MED ORDER — MONTELUKAST SODIUM 10 MG PO TABS: 10.0000 mg | ORAL_TABLET | Freq: Every day | ORAL | 5 refills | Status: DC

## 2017-03-03 MED ORDER — NAPROXEN 500 MG PO TABS
500.0000 mg | ORAL_TABLET | Freq: Two times a day (BID) | ORAL | 3 refills | Status: DC
Start: 2017-03-03 — End: 2017-03-26

## 2017-03-03 MED ORDER — PANTOPRAZOLE SODIUM 20 MG PO TBEC: 20.0000 mg | DELAYED_RELEASE_TABLET | Freq: Every day | ORAL | 5 refills | Status: DC

## 2017-03-03 MED ORDER — FLUTICASONE PROPIONATE 50 MCG/ACT NA SUSP: 2.0000 | Freq: Every day | NASAL | 6 refills | Status: DC

## 2017-03-03 MED ORDER — IRBESARTAN 150 MG PO TABS: ORAL_TABLET | ORAL | 5 refills | Status: DC

## 2017-03-03 MED ORDER — DEXTROMETHORPHAN-GUAIFENESIN 10-100 MG/5ML PO LIQD: 10.0000 mL | ORAL | 0 refills | Status: DC | PRN

## 2017-03-03 NOTE — Patient Instructions (Signed)

## 2017-03-03 NOTE — Progress Notes (Signed)
Patient is here FU  Patient denies pain at this time.  Patient has taken medication today. Patient has eaten today.

## 2017-03-03 NOTE — Progress Notes (Signed)
Subjective:  Patient ID: Angel Hebert, female    DOB: 1955-12-29  Age: 61 y.o. MRN: [id number]  CC: Follow-up   HPI Angel Hebert Is a 60 -year-old female with a history of hypertension, hyperlipidemia, asthma, allergic rhinitis, nasal polyps (Status post sinus surgery in 10/2016), GERD, bilateral knee osteoarthritis (status post left total knee arthroplasty in 09/2016) who comes in for a follow-up visit.  She has completed physical therapy and still follows up with her orthopedics - Dr. Marlou Sa from whom she receives Tramadol which she takes in addition to naproxen as she does have some residual pain in her left knee.   Complaints of persisting cough ever since she had the pneumonia early on in the month and has used OTC Robitussin with no much relief . She has been compliant with Flonase, Singulair and antihistamine.  Asthma is controlled and she denies wheezing or shortness of breath. She has been compliant with her statin and antihypertensive.  She has no other concerns at this time  Labs reviewed - she did have hypokalemia of 3.3 and anemia with a hemoglobin of 8.4 from her most recent labs from 02/04/17.  Past Medical History:  Diagnosis Date  . Arthritis   . Asthma    last year in December  . Dyspnea   . GERD (gastroesophageal reflux disease)   . High cholesterol   . Hypertension   . Pneumonia 01/2017    Past Surgical History:  Procedure Laterality Date  . ABDOMINAL HYSTERECTOMY    . nasal polyps     removed just this past tuesday at [facility] facility over by [facility]  . TOTAL KNEE ARTHROPLASTY Left 10/01/2016   Procedure: LEFT TOTAL KNEE ARTHROPLASTY;  Surgeon: Meredith Pel, MD;  Location: [facility];  Service: Orthopedics;  Laterality: Left;    Allergies  Allergen Reactions  . No Known Allergies      Outpatient Medications Prior to Visit  Medication Sig Dispense Refill  . ALL DAY ALLERGY 10 MG tablet TAKE 1 TABLET BY MOUTH DAILY. 30 tablet 2  .  aspirin EC 325 MG tablet Begin taking aspirin on 10/27/2016 30 tablet 0  . traMADol (ULTRAM) 50 MG tablet Take 1 tablet (50 mg total) by mouth every 12 (twelve) hours as needed. 30 tablet 0  . albuterol (PROVENTIL) (2.5 MG/3ML) 0.083% nebulizer solution Take 3 mLs (2.5 mg total) by nebulization every 6 (six) hours as needed for wheezing or shortness of breath. 75 mL 3  . atorvastatin (LIPITOR) 10 MG tablet TAKE 1 TABLET BY MOUTH DAILY AT 6 PM FOR CHOLESTEROL 30 tablet 5  . fluticasone (FLONASE) 50 MCG/ACT nasal spray Place 2 sprays into both nostrils daily. 16 g 6  . Fluticasone-Salmeterol (ADVAIR) 500-50 MCG/DOSE AEPB Inhale 1 puff into the lungs every 12 (twelve) hours. 60 each 5  . hydrochlorothiazide (HYDRODIURIL) 25 MG tablet TAKE 1/2 TABLET BY MOUTH DAILY FOR BLOOD PRESSURE 30 tablet 5  . irbesartan (AVAPRO) 150 MG tablet TAKE 1 TABLET BY MOUTH DAILY AFTER BREAKFAST FOR BLOOD PRESSURE 30 tablet 5  . montelukast (SINGULAIR) 10 MG tablet Take 1 tablet (10 mg total) by mouth at bedtime. 30 tablet 5  . naproxen (NAPROSYN) 500 MG tablet Take 1 tablet (500 mg total) by mouth 2 (two) times daily with a meal. 60 tablet 3  . ondansetron (ZOFRAN-ODT) 8 MG disintegrating tablet Take 1 tablet (8 mg total) by mouth every 8 (eight) hours as needed for nausea or vomiting. 20 tablet 0  . pantoprazole (  PROTONIX) 20 MG tablet Take 1 tablet (20 mg total) by mouth daily. 30 tablet 5  . PROAIR HFA 108 (90 Base) MCG/ACT inhaler Inhale 2 puffs into the lungs every 6 (six) hours as needed for wheezing or shortness of breath. 1 Inhaler 5  . traZODone (DESYREL) 50 MG tablet Take 1 tablet (50 mg total) by mouth at bedtime. 30 tablet 5   No facility-administered medications prior to visit.     ROS Review of Systems Constitutional: Negative for activity change, appetite change and fatigue.  HENT: No sinus tenderness, no postnasal drip Eyes: Negative for visual disturbance.  Respiratory: Positive for cough, negative  for chest tightness, shortness of breath and wheezing.   Cardiovascular: Negative for chest pain and palpitations.  Gastrointestinal: Negative for abdominal distention, abdominal pain and constipation.  Endocrine: Negative for polydipsia.  Genitourinary: Negative for dysuria and frequency.  Musculoskeletal: See history of present illness Skin: Negative for rash.  Neurological: Negative for tremors, light-headedness and numbness.  Hematological: Does not bruise/bleed easily.  Psychiatric/Behavioral: Negative for agitation and behavioral problems  Objective:  BP 100/60 (BP Location: Left Arm, Patient Position: Sitting, Cuff Size: Large)   Pulse 97   Temp 98.4 F (36.9 C) (Oral)   Resp 18   Ht 5\' 7"  (1.702 m)   Wt 167 lb (75.8 kg)   SpO2 98%   BMI 26.16 kg/m   BP/Weight 03/03/2017 10/09/7410 04/09/8675  Systolic BP 720 947 98  Diastolic BP 60 84 63  Wt. (Lbs) 167 166 163.4  BMI 26.16 26 25.59      Physical Exam Constitutional: She is oriented to person, place, and time. She appears well-developed and well-nourished.  HENT:  Right Ear: Cerumen obscuring TM  Left Ear: Cerumen obscuring TM Oropharynx: Normal  Cardiovascular: Normal rate, normal heart sounds and intact distal pulses.   No murmur heard. Pulmonary/Chest: Effort normal and breath sounds normal. She has no wheezes. She has no rales. She exhibits no tenderness.  Abdominal: Soft. Bowel sounds are normal. She exhibits no distension and no mass. There is no tenderness.  Musculoskeletal:  Right knee: Normal Left knee: healed vertical surgical scar, mild tenderness and restricted range of motion  Neurological: She is alert and oriented to person, place, and time.  Skin: Skin is warm and dry.  Psychiatric: She has a normal mood and affect.    . CMP Latest Ref Rng & Units 02/05/2017 02/04/2017 09/23/2016  Glucose 65 - 99 mg/dL 98 84 91  BUN 6 - 20 mg/dL 7 10 6   Creatinine 0.44 - 1.00 mg/dL 0.65 0.78 0.70  Sodium 135 - 145  mmol/L 132(L) 133(L) 135  Potassium 3.5 - 5.1 mmol/L 3.3(L) 2.9(L) 3.6  Chloride 101 - 111 mmol/L 100(L) 97(L) 103  CO2 22 - 32 mmol/L 19(L) 21(L) 25  Calcium 8.9 - 10.3 mg/dL 8.8(L) 9.3 9.2  Total Protein 6.5 - 8.1 g/dL - 8.3(H) 7.1  Total Bilirubin 0.3 - 1.2 mg/dL - 1.4(H) 0.3  Alkaline Phos 38 - 126 U/L - 107 73  AST 15 - 41 U/L - 17 18  ALT 14 - 54 U/L - 9(L) 8(L)     CBC    Component Value Date/Time   WBC 22.4 (H) 02/05/2017 0539   RBC 3.21 (L) 02/05/2017 0539   HGB 8.4 (L) 02/05/2017 0539   HCT 26.5 (L) 02/05/2017 0539   PLT 547 (H) 02/05/2017 0539   MCV 82.6 02/05/2017 0539   MCH 26.2 02/05/2017 0539   MCHC 31.7 02/05/2017  0539   RDW 19.0 (H) 02/05/2017 0539   LYMPHSABS 1.1 02/04/2017 1030   MONOABS 1.1 (H) 02/04/2017 1030   EOSABS 0.0 02/04/2017 1030   BASOSABS 0.0 02/04/2017 1030    Lipid Panel     Component Value Date/Time   CHOL 185 05/20/2016 1452   TRIG 129 05/20/2016 1452   HDL 74 05/20/2016 1452   CHOLHDL 2.5 05/20/2016 1452   VLDL 26 05/20/2016 1452   LDLCALC 85 05/20/2016 1452    Assessment & Plan:   1. Pure hypercholesterolemia Controlled - atorvastatin (LIPITOR) 10 MG tablet; TAKE 1 TABLET BY MOUTH DAILY AT 6 PM FOR CHOLESTEROL  Dispense: 30 tablet; Refill: 5  2. Essential hypertension We'll need to reassess blood pressure at her next visit as I have discontinued hydrochlorothiazide due to hypokalemia - irbesartan (AVAPRO) 150 MG tablet; TAKE 1 TABLET BY MOUTH DAILY AFTER BREAKFAST FOR BLOOD PRESSURE  Dispense: 30 tablet; Refill: 5  3. Mild intermittent asthma without complication Stable No acute exacerbations - Fluticasone-Salmeterol (ADVAIR) 500-50 MCG/DOSE AEPB; Inhale 1 puff into the lungs every 12 (twelve) hours.  Dispense: 60 each; Refill: 5 - montelukast (SINGULAIR) 10 MG tablet; Take 1 tablet (10 mg total) by mouth at bedtime.  Dispense: 30 tablet; Refill: 5 - PROAIR HFA 108 (90 Base) MCG/ACT inhaler; Inhale 2 puffs into the lungs  every 6 (six) hours as needed for wheezing or shortness of breath.  Dispense: 1 Inhaler; Refill: 5 - albuterol (PROVENTIL) (2.5 MG/3ML) 0.083% nebulizer solution; Take 3 mLs (2.5 mg total) by nebulization every 6 (six) hours as needed for wheezing or shortness of breath.  Dispense: 75 mL; Refill: 3  4. Gastroesophageal reflux disease, esophagitis presence not specified Controlled - pantoprazole (PROTONIX) 20 MG tablet; Take 1 tablet (20 mg total) by mouth daily.  Dispense: 30 tablet; Refill: 5  5. Anemia, unspecified type Last hemoglobin was 8.4 - CBC with Differential/Platelet  6. Hypokalemia Last potassium was 3.3 Hypokalemia could be secondary to hydrochlorothiazide which have discontinued today Increase intake of potassium rich foods - Comprehensive metabolic panel  7. History of total knee arthroplasty, left Refilled naproxen  8. Chronic ethmoidal sinusitis Stable - fluticasone (FLONASE) 50 MCG/ACT nasal spray; Place 2 sprays into both nostrils daily.  Dispense: 16 g; Refill: 6 - dextromethorphan-guaiFENesin (TUSSIN DM) 10-100 MG/5ML liquid; Take 10 mLs by mouth every 4 (four) hours as needed for cough.  Dispense: 240 mL; Refill: 0  10. Other insomnia Controlled - traZODone (DESYREL) 50 MG tablet; Take 1 tablet (50 mg total) by mouth at bedtime.  Dispense: 30 tablet; Refill: 5   Meds ordered this encounter  Medications  . atorvastatin (LIPITOR) 10 MG tablet    Sig: TAKE 1 TABLET BY MOUTH DAILY AT 6 PM FOR CHOLESTEROL    Dispense:  30 tablet    Refill:  5  . irbesartan (AVAPRO) 150 MG tablet    Sig: TAKE 1 TABLET BY MOUTH DAILY AFTER BREAKFAST FOR BLOOD PRESSURE    Dispense:  30 tablet    Refill:  5    Discontinue hydrochlorothiazide  . Fluticasone-Salmeterol (ADVAIR) 500-50 MCG/DOSE AEPB    Sig: Inhale 1 puff into the lungs every 12 (twelve) hours.    Dispense:  60 each    Refill:  5  . montelukast (SINGULAIR) 10 MG tablet    Sig: Take 1 tablet (10 mg total) by  mouth at bedtime.    Dispense:  30 tablet    Refill:  5  . PROAIR HFA 108 (  90 Base) MCG/ACT inhaler    Sig: Inhale 2 puffs into the lungs every 6 (six) hours as needed for wheezing or shortness of breath.    Dispense:  1 Inhaler    Refill:  5  . fluticasone (FLONASE) 50 MCG/ACT nasal spray    Sig: Place 2 sprays into both nostrils daily.    Dispense:  16 g    Refill:  6  . albuterol (PROVENTIL) (2.5 MG/3ML) 0.083% nebulizer solution    Sig: Take 3 mLs (2.5 mg total) by nebulization every 6 (six) hours as needed for wheezing or shortness of breath.    Dispense:  75 mL    Refill:  3  . pantoprazole (PROTONIX) 20 MG tablet    Sig: Take 1 tablet (20 mg total) by mouth daily.    Dispense:  30 tablet    Refill:  5  . traZODone (DESYREL) 50 MG tablet    Sig: Take 1 tablet (50 mg total) by mouth at bedtime.    Dispense:  30 tablet    Refill:  5  . naproxen (NAPROSYN) 500 MG tablet    Sig: Take 1 tablet (500 mg total) by mouth 2 (two) times daily with a meal.    Dispense:  60 tablet    Refill:  3  . dextromethorphan-guaiFENesin (TUSSIN DM) 10-100 MG/5ML liquid    Sig: Take 10 mLs by mouth every 4 (four) hours as needed for cough.    Dispense:  240 mL    Refill:  0    Follow-up: Return in about 1 month (around 04/03/2017) for Follow-up on hypertension and hypokalemia.   Arnoldo Morale MD

## 2017-03-04 ENCOUNTER — Other Ambulatory Visit: Payer: Self-pay | Admitting: Family Medicine

## 2017-03-04 ENCOUNTER — Other Ambulatory Visit (INDEPENDENT_AMBULATORY_CARE_PROVIDER_SITE_OTHER): Payer: Self-pay | Admitting: Orthopedic Surgery

## 2017-03-04 LAB — COMPREHENSIVE METABOLIC PANEL
ALK PHOS: 173 IU/L — AB (ref 39–117)
ALT: 5 IU/L (ref 0–32)
AST: 13 IU/L (ref 0–40)
Albumin/Globulin Ratio: 1.2 (ref 1.2–2.2)
Albumin: 4.1 g/dL (ref 3.6–4.8)
BUN/Creatinine Ratio: 19 (ref 12–28)
BUN: 12 mg/dL (ref 8–27)
Bilirubin Total: <0.2 mg/dL (ref 0.0–1.2)
CALCIUM: 9.6 mg/dL (ref 8.7–10.3)
CO2: 22 mmol/L (ref 20–29)
CREATININE: 0.64 mg/dL (ref 0.57–1.00)
Chloride: 101 mmol/L (ref 96–106)
GFR calc Af Amer: 112 mL/min/{1.73_m2} (ref >59)
GFR, EST NON AFRICAN AMERICAN: 97 mL/min/{1.73_m2} (ref >59)
GLOBULIN, TOTAL: 3.4 g/dL (ref 1.5–4.5)
GLUCOSE: 79 mg/dL (ref 65–99)
Potassium: 3.7 mmol/L (ref 3.5–5.2)
SODIUM: 139 mmol/L (ref 134–144)
Total Protein: 7.5 g/dL (ref 6.0–8.5)

## 2017-03-04 LAB — CBC WITH DIFFERENTIAL/PLATELET
BASOS ABS: 0.1 10*3/uL (ref 0.0–0.2)
Basos: 1 %
EOS (ABSOLUTE): 1.3 10*3/uL — ABNORMAL HIGH (ref 0.0–0.4)
Eos: 13 %
HEMOGLOBIN: 9.5 g/dL — AB (ref 11.1–15.9)
Hematocrit: 30.7 % — ABNORMAL LOW (ref 34.0–46.6)
IMMATURE GRANULOCYTES: 0 %
Immature Grans (Abs): 0 10*3/uL (ref 0.0–0.1)
Lymphocytes Absolute: 2.6 10*3/uL (ref 0.7–3.1)
Lymphs: 27 %
MCH: 27 pg (ref 26.6–33.0)
MCHC: 30.9 g/dL — ABNORMAL LOW (ref 31.5–35.7)
MCV: 87 fL (ref 79–97)
MONOCYTES: 6 %
Monocytes Absolute: 0.6 10*3/uL (ref 0.1–0.9)
NEUTROS PCT: 53 %
Neutrophils Absolute: 5.3 10*3/uL (ref 1.4–7.0)
Platelets: 515 10*3/uL — ABNORMAL HIGH (ref 150–379)
RBC: 3.52 x10E6/uL — AB (ref 3.77–5.28)
RDW: 19.3 % — AB (ref 12.3–15.4)
WBC: 9.9 10*3/uL (ref 3.4–10.8)

## 2017-03-04 MED ORDER — FERROUS SULFATE 325 (65 FE) MG PO TABS: 325.0000 mg | ORAL_TABLET | Freq: Two times a day (BID) | ORAL | 3 refills | Status: DC

## 2017-03-06 NOTE — Telephone Encounter (Signed)
y

## 2017-03-06 NOTE — Telephone Encounter (Signed)
Ok to refill 

## 2017-03-25 ENCOUNTER — Other Ambulatory Visit (INDEPENDENT_AMBULATORY_CARE_PROVIDER_SITE_OTHER): Payer: Self-pay | Admitting: Orthopedic Surgery

## 2017-03-25 NOTE — Telephone Encounter (Signed)
y

## 2017-03-25 NOTE — Telephone Encounter (Signed)
Ok to rf? 

## 2017-03-26 ENCOUNTER — Emergency Department (HOSPITAL_COMMUNITY): Payer: Medicaid Other

## 2017-03-26 ENCOUNTER — Telehealth (INDEPENDENT_AMBULATORY_CARE_PROVIDER_SITE_OTHER): Payer: Self-pay | Admitting: Orthopedic Surgery

## 2017-03-26 ENCOUNTER — Emergency Department (HOSPITAL_COMMUNITY)
Admission: EM | Admit: 2017-03-26 | Discharge: 2017-03-26 | Disposition: A | Discharge status: Home / Self Care | Payer: Medicaid Other | Attending: Emergency Medicine | Admitting: Emergency Medicine

## 2017-03-26 ENCOUNTER — Other Ambulatory Visit (INDEPENDENT_AMBULATORY_CARE_PROVIDER_SITE_OTHER): Payer: Self-pay | Admitting: Orthopedic Surgery

## 2017-03-26 ENCOUNTER — Encounter (HOSPITAL_COMMUNITY): Payer: Self-pay

## 2017-03-26 DIAGNOSIS — J45909 Unspecified asthma, uncomplicated: Secondary | ICD-10-CM | POA: Insufficient documentation

## 2017-03-26 DIAGNOSIS — Z79899 Other long term (current) drug therapy: Secondary | ICD-10-CM | POA: Diagnosis not present

## 2017-03-26 DIAGNOSIS — Z7982 Long term (current) use of aspirin: Secondary | ICD-10-CM | POA: Diagnosis not present

## 2017-03-26 DIAGNOSIS — I10 Essential (primary) hypertension: Secondary | ICD-10-CM | POA: Diagnosis not present

## 2017-03-26 DIAGNOSIS — Z96652 Presence of left artificial knee joint: Secondary | ICD-10-CM | POA: Diagnosis not present

## 2017-03-26 DIAGNOSIS — M25561 Pain in right knee: Secondary | ICD-10-CM | POA: Insufficient documentation

## 2017-03-26 MED ORDER — NAPROXEN 500 MG PO TABS: 500.0000 mg | ORAL_TABLET | Freq: Two times a day (BID) | ORAL | 0 refills | Status: DC

## 2017-03-26 MED ORDER — IBUPROFEN 400 MG PO TABS
600.0000 mg | ORAL_TABLET | Freq: Once | ORAL | Status: AC
  Administered 2017-03-26: 13:00:00 600 mg via ORAL

## 2017-03-26 NOTE — Discharge Instructions (Signed)
Take your medication as prescribed as needed for pain relief. I recommend continuing to rest, elevate and ice her knee for 15-20 minutes 3-4 times daily. Follow-up with your orthopedist if your symptoms have not improved over the next 1-2 weeks. Please return to the Emergency Department if symptoms worsen or new onset of fever, redness, swelling, warmth, numbness, decreased range of motion.

## 2017-03-26 NOTE — ED Provider Notes (Signed)
[facility] DEPT Provider Note   CSN: [id number] Arrival date & time: 03/26/17  1052     History   Chief Complaint Chief Complaint  Patient presents with  . Knee Pain    HPI Angel Hebert is a 61 y.o. female.  HPI   Pt is a 61 yo female with PMH of arthritis, asthma, HTN, HLD who presents the ED with complaint of right knee pain, onset 2 months. Patient reports having gradually worsening intermittent pain to her right knee with associated swelling. She notes having a left knee replacement performed in January 2018 and states since her surgery she has been putting more weight on her right knee which she thinks is causing her pain. Denies any recent fall, trauma or injury. She reports feeling like there is fluid in her joint. Denies fever, redness, warmth, numbness, weakness, decreased range of motion. She states she has been taking Tylenol at home without relief. Denies history of gout.  Past Medical History:  Diagnosis Date  . Arthritis   . Asthma    last year in December  . Dyspnea   . GERD (gastroesophageal reflux disease)   . High cholesterol   . Hypertension   . Pneumonia 01/2017    Patient Active Problem List   Diagnosis Date Noted  . Insomnia 03/03/2017  . 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 Presence of left artificial knee joint 10/14/2016  . Arthritis of knee 10/01/2016  . Unilateral primary osteoarthritis, left knee 12/13/2015  . Benign essential HTN   . Physical deconditioning   . Anemia, iron deficiency   . Pressure ulcer 10/14/2015  . CAP (community acquired pneumonia)   . Pleural effusion   . Sepsis ([place])   . Sepsis due to Streptococcus pneumoniae ([place])   . Bacteremia   . Community acquired pneumonia 10/08/2015  . Acute sinusitis 12/02/2014  . Sinusitis 03/17/2013  . Hyperlipidemia 03/02/2007  . Essential hypertension 03/02/2007  . Allergic rhinitis 03/02/2007  . Asthma 03/02/2007  . GERD 03/02/2007    Past Surgical  History:  Procedure Laterality Date  . ABDOMINAL HYSTERECTOMY    . nasal polyps     removed just this past tuesday at [facility] facility over by [facility]  . TOTAL KNEE ARTHROPLASTY Left 10/01/2016   Procedure: LEFT TOTAL KNEE ARTHROPLASTY;  Surgeon: Meredith Pel, MD;  Location: [facility];  Service: Orthopedics;  Laterality: Left;    OB History    No data available       Home Medications    Prior to Admission medications   Medication Sig Start Date End Date Taking? Authorizing Provider  albuterol (PROVENTIL) (2.5 MG/3ML) 0.083% nebulizer solution Take 3 mLs (2.5 mg total) by nebulization every 6 (six) hours as needed for wheezing or shortness of breath. 03/03/17   Arnoldo Morale, MD  ALL DAY ALLERGY 10 MG tablet TAKE 1 TABLET BY MOUTH DAILY. 01/28/17   Arnoldo Morale, MD  aspirin EC 325 MG tablet Begin taking aspirin on 10/27/2016 10/14/16   Meredith Pel, MD  atorvastatin (LIPITOR) 10 MG tablet TAKE 1 TABLET BY MOUTH DAILY AT 6 PM FOR CHOLESTEROL 03/03/17   Arnoldo Morale, MD  dextromethorphan-guaiFENesin (TUSSIN DM) 10-100 MG/5ML liquid Take 10 mLs by mouth every 4 (four) hours as needed for cough. 03/03/17   Arnoldo Morale, MD  ferrous sulfate 325 (65 FE) MG tablet Take 1 tablet (325 mg total) by mouth 2 (two) times daily with a meal. 03/04/17   Arnoldo Morale, MD  fluticasone (FLONASE) 50 MCG/ACT nasal spray Place 2 sprays into both nostrils daily. 03/03/17   Arnoldo Morale, MD  Fluticasone-Salmeterol (ADVAIR) 500-50 MCG/DOSE AEPB Inhale 1 puff into the lungs every 12 (twelve) hours. 03/03/17   Arnoldo Morale, MD  irbesartan (AVAPRO) 150 MG tablet TAKE 1 TABLET BY MOUTH DAILY AFTER BREAKFAST FOR BLOOD PRESSURE 03/03/17   Arnoldo Morale, MD  montelukast (SINGULAIR) 10 MG tablet Take 1 tablet (10 mg total) by mouth at bedtime. 03/03/17   Arnoldo Morale, MD  naproxen (NAPROSYN) 500 MG tablet Take 1 tablet (500 mg total) by mouth 2 (two) times daily. 03/26/17   Nona Dell, PA-C  pantoprazole  (PROTONIX) 20 MG tablet Take 1 tablet (20 mg total) by mouth daily. 03/03/17   Arnoldo Morale, MD  PROAIR HFA 108 (90 Base) MCG/ACT inhaler Inhale 2 puffs into the lungs every 6 (six) hours as needed for wheezing or shortness of breath. 03/03/17   Arnoldo Morale, MD  traMADol (ULTRAM) 50 MG tablet TAKE 1 TABLET EVERY 12 HOURS AS NEEDED FOR PAIN 03/25/17   Meredith Pel, MD  traZODone (DESYREL) 50 MG tablet Take 1 tablet (50 mg total) by mouth at bedtime. 03/03/17   Arnoldo Morale, MD    Family History Family History  Problem Relation Age of Onset  . Diabetes Sister   . Colon cancer Neg Hx     Social History Social History  Substance Use Topics  . Smoking status: Never Smoker  . Smokeless tobacco: Never Used  . Alcohol use No     Comment: Quit years ago     Allergies   No known allergies   Review of Systems Review of Systems  Musculoskeletal: Positive for arthralgias (right knee).  All other systems reviewed and are negative.    Physical Exam Updated Vital Signs BP 129/72 (BP Location: Left Arm)   Pulse 97   Temp 97.8 F (36.6 C) (Oral)   Resp 16   Ht 5\' 7"  (1.702 m)   Wt 75.3 kg (166 lb)   SpO2 98%   BMI 26.00 kg/m   Physical Exam  Constitutional: She is oriented to person, place, and time. She appears well-developed and well-nourished. No distress.  HENT:  Head: Normocephalic and atraumatic.  Eyes: Conjunctivae and EOM are normal. Right eye exhibits no discharge. Left eye exhibits no discharge. No scleral icterus.  Neck: Normal range of motion. Neck supple.  Cardiovascular: Normal rate and intact distal pulses.   Pulmonary/Chest: Effort normal.  Musculoskeletal: Normal range of motion. She exhibits tenderness. She exhibits no edema or deformity.       Right hip: Normal.       Right knee: She exhibits swelling. She exhibits normal range of motion (mild), no effusion, no ecchymosis, no deformity, no laceration, no erythema, normal alignment, no LCL laxity, normal  patellar mobility and no MCL laxity. Tenderness (mild posterior) found.       Right ankle: Normal.       Right upper leg: Normal.       Right lower leg: Normal.  Neurological: She is alert and oriented to person, place, and time.  Skin: Skin is warm and dry. Capillary refill takes less than 2 seconds. She is not diaphoretic.  Nursing note and vitals reviewed.    ED Treatments / Results  Labs (all labs ordered are listed, but only abnormal results are displayed) Labs Reviewed - No data to display  EKG  EKG Interpretation None       Radiology  Dg Knee Complete 4 Views Right  Result Date: 03/26/2017 CLINICAL DATA:  Onset of right knee pain 2-3 weeks ago without known injury. The patient has recently undergone left knee replacement with resultant gait change. EXAM: RIGHT KNEE - COMPLETE 4+ VIEW COMPARISON:  Right knee series of in 08/2016 FINDINGS: The bones are subjectively mildly osteopenic. There is mild narrowing of the medial joint compartment which is stable. Spurs arise from the periphery of the medial tibial plateau and medial femoral condyle. There is beaking of the tibial spines. There is a suprapatellar effusion. A spur arises from the inferior articular margin of the patella. IMPRESSION: There is osteoarthritic change centered on the medial and patellofemoral compartments. There is small suprapatellar effusion. There is no acute fracture nor dislocation. Electronically Signed   By: David  Martinique M.D.   On: 03/26/2017 12:15    Procedures Procedures (including critical care time)  Medications Ordered in ED Medications  ibuprofen (ADVIL,MOTRIN) tablet 600 mg (600 mg Oral Given 03/26/17 1253)     Initial Impression / Assessment and Plan / ED Course  I have reviewed the triage vital signs and the nursing notes.  Pertinent labs & imaging results that were available during my care of the patient were reviewed by me and considered in my medical decision making (see chart for  details).     Patient presents with right knee pain that has been present for the past 2 months. Denies any associated fall, trauma or injury. Denies fever, redness, swelling. Reports pain started after having her left knee replaced in January. VSS. Exam revealed mild swelling present to right anterior knee with mild tenderness. Patient with full range of motion of right knee. Right lower extremity neurovascularly intact. Pt is able ambulate, no bony abnormality or deformity, no erythema or excessive heat, no evidence of cellulitis, DVT, or septic joint. Right knee x-ray revealed osteoarthritic changes with small suprapatellar effusion, no acute fracture or dislocation noted. Patient discharged home with NSAIDs and symptomatic treatment. Advised to follow-up with orthopedist as needed. Discussed return precautions.  Final Clinical Impressions(s) / ED Diagnoses   Final diagnoses:  Acute pain of right knee    New Prescriptions Discharge Medication List as of 03/26/2017  1:38 PM       Nona Dell, PA-C 03/26/17 1354    Daleen Bo, MD 03/27/17 1125

## 2017-03-26 NOTE — ED Notes (Signed)
Pt is in stable condition upon d/c and is escorted from ED via wheelchair. 

## 2017-03-26 NOTE — ED Notes (Signed)
Pt in xray

## 2017-03-26 NOTE — Telephone Encounter (Signed)
Called rx. Pt advised

## 2017-03-26 NOTE — Telephone Encounter (Signed)
PT REQUEST REFILL OF TRAMADOL PLEASE.  [phone]

## 2017-03-26 NOTE — ED Triage Notes (Signed)
Pt presents for evaluation of R knee pain x 3 weeks. Reports hx of L knee replacement, denies injury to R knee but reports feels like has been putting more weight on it since surgery. Pt ambulatory.

## 2017-03-31 ENCOUNTER — Encounter (INDEPENDENT_AMBULATORY_CARE_PROVIDER_SITE_OTHER): Payer: Self-pay | Admitting: Orthopedic Surgery

## 2017-03-31 ENCOUNTER — Ambulatory Visit (INDEPENDENT_AMBULATORY_CARE_PROVIDER_SITE_OTHER): Payer: Medicaid Other | Admitting: Orthopedic Surgery

## 2017-03-31 DIAGNOSIS — G8929 Other chronic pain: Secondary | ICD-10-CM

## 2017-03-31 DIAGNOSIS — M25561 Pain in right knee: Secondary | ICD-10-CM

## 2017-03-31 MED ORDER — BUPIVACAINE HCL 0.25 % IJ SOLN
4.0000 mL | INTRAMUSCULAR | Status: AC | PRN
  Administered 2017-03-31: 4 mL via INTRA_ARTICULAR

## 2017-03-31 MED ORDER — METHYLPREDNISOLONE ACETATE 40 MG/ML IJ SUSP
40.0000 mg | INTRAMUSCULAR | Status: AC | PRN
  Administered 2017-03-31: 40 mg via INTRA_ARTICULAR

## 2017-03-31 MED ORDER — LIDOCAINE HCL 1 % IJ SOLN
5.0000 mL | INTRAMUSCULAR | Status: AC | PRN
  Administered 2017-03-31: 5 mL

## 2017-03-31 NOTE — Progress Notes (Signed)
Office Visit Note   Patient: Angel Hebert           Date of Birth: [birth year]           MRN: [id number] Visit Date: 03/31/2017 Requested by: Arnoldo Morale, MD [place], [place] [id number] PCP: Arnoldo Morale, MD  Subjective: Chief Complaint  Patient presents with  . Right Leg - Follow-up    HPI: [name] is a 61-year-old patient with right knee pain.  She had left total knee replacement done earlier this year and she is doing well with that.  She went to the emergency room for right knee pain 03/26/2017.  She's taking Ultram for pain.  Denies any history of injury.              ROS: All systems reviewed are negative as they relate to the chief complaint within the history of present illness.  Patient denies  fevers or chills.   Assessment & Plan: Visit Diagnoses:  1. Chronic pain of right knee     Plan: Impression is right knee pain with exacerbation of arthritis.  Plan is for injection today.  Continue with Ultram for pain.  Continue nonweightbearing quad strengthening exercises.  She may need knee replacement in the future.  She's doing well with her left knee replacement.  Follow-Up Instructions: Return if symptoms worsen or fail to improve.   Orders:  No orders of the defined types were placed in this encounter.  No orders of the defined types were placed in this encounter.     Procedures: Large Joint Inj Date/Time: 03/31/2017 10:05 PM Performed by: Meredith Pel Authorized by: Meredith Pel   Consent Given by:  Patient Site marked: the procedure site was marked   Timeout: prior to procedure the correct patient, procedure, and site was verified   Indications:  Pain, joint swelling and diagnostic evaluation Location:  Knee Site:  R knee Prep: patient was prepped and draped in usual sterile fashion   Needle Size:  18 G Needle Length:  1.5 inches Approach:  Superolateral Ultrasound Guidance: No   Fluoroscopic Guidance: No     Arthrogram: No   Medications:  5 mL lidocaine 1 %; 4 mL bupivacaine 0.25 %; 40 mg methylPREDNISolone acetate 40 MG/ML Patient tolerance:  Patient tolerated the procedure well with no immediate complications     Clinical Data: No additional findings.  Objective: Vital Signs: There were no vitals taken for this visit.  Physical Exam:   Constitutional: Patient appears well-developed HEENT:  Head: Normocephalic Eyes:EOM are normal Neck: Normal range of motion Cardiovascular: Normal rate Pulmonary/chest: Effort normal Neurologic: Patient is alert Skin: Skin is warm Psychiatric: Patient has normal mood and affect    Ortho Exam: Orthopedic exam demonstrates full active and passive range of motion that right knee with no effusion.  Extensor mechanism is intact.  Pedal pulses palpable.  No other masses lymph adenopathy or skin changes noted in the right knee region.  No groin pain with internal/external rotation of the leg.  Specialty Comments:  No specialty comments available.  Imaging: No results found.   PMFS History: Patient Active Problem List   Diagnosis Date Noted  . Insomnia 03/03/2017  . 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 Presence of left artificial knee joint 10/14/2016  . Arthritis of knee 10/01/2016  . Unilateral primary osteoarthritis, left knee 12/13/2015  . Benign essential HTN   . Physical deconditioning   . Anemia, iron deficiency   .  Pressure ulcer 10/14/2015  . CAP (community acquired pneumonia)   . Pleural effusion   . Sepsis ([place])   . Sepsis due to Streptococcus pneumoniae ([place])   . Bacteremia   . Community acquired pneumonia 10/08/2015  . Acute sinusitis 12/02/2014  . Sinusitis 03/17/2013  . Hyperlipidemia 03/02/2007  . Essential hypertension 03/02/2007  . Allergic rhinitis 03/02/2007  . Asthma 03/02/2007  . GERD 03/02/2007   Past Medical History:  Diagnosis Date  . Arthritis   . Asthma    last year in December  .  Dyspnea   . GERD (gastroesophageal reflux disease)   . High cholesterol   . Hypertension   . Pneumonia 01/2017    Family History  Problem Relation Age of Onset  . Diabetes Sister   . Colon cancer Neg Hx     Past Surgical History:  Procedure Laterality Date  . ABDOMINAL HYSTERECTOMY    . nasal polyps     removed just this past tuesday at [facility] facility over by [facility]  . TOTAL KNEE ARTHROPLASTY Left 10/01/2016   Procedure: LEFT TOTAL KNEE ARTHROPLASTY;  Surgeon: Meredith Pel, MD;  Location: [facility];  Service: Orthopedics;  Laterality: Left;   Social History   Occupational History  . Not on file.   Social History Main Topics  . Smoking status: Never Smoker  . Smokeless tobacco: Never Used  . Alcohol use No     Comment: Quit years ago  . Drug use: No  . Sexual activity: Not on file

## 2017-04-04 ENCOUNTER — Ambulatory Visit: Payer: Medicaid Other | Admitting: Family Medicine

## 2017-04-09 ENCOUNTER — Encounter: Payer: Self-pay | Admitting: Family Medicine

## 2017-04-09 ENCOUNTER — Ambulatory Visit: Payer: Medicaid Other | Attending: Family Medicine | Admitting: Family Medicine

## 2017-04-09 VITALS — BP 153/83 | HR 96 | Temp 97.8°F | Ht 67.0 in | Wt 161.0 lb

## 2017-04-09 DIAGNOSIS — I10 Essential (primary) hypertension: Secondary | ICD-10-CM | POA: Diagnosis not present

## 2017-04-09 DIAGNOSIS — Z96653 Presence of artificial knee joint, bilateral: Secondary | ICD-10-CM | POA: Diagnosis not present

## 2017-04-09 DIAGNOSIS — E7800 Pure hypercholesterolemia, unspecified: Secondary | ICD-10-CM | POA: Insufficient documentation

## 2017-04-09 DIAGNOSIS — J309 Allergic rhinitis, unspecified: Secondary | ICD-10-CM | POA: Diagnosis not present

## 2017-04-09 DIAGNOSIS — Z79899 Other long term (current) drug therapy: Secondary | ICD-10-CM | POA: Diagnosis not present

## 2017-04-09 DIAGNOSIS — K219 Gastro-esophageal reflux disease without esophagitis: Secondary | ICD-10-CM | POA: Diagnosis not present

## 2017-04-09 DIAGNOSIS — M170 Bilateral primary osteoarthritis of knee: Secondary | ICD-10-CM | POA: Insufficient documentation

## 2017-04-09 DIAGNOSIS — Z7982 Long term (current) use of aspirin: Secondary | ICD-10-CM | POA: Insufficient documentation

## 2017-04-09 MED ORDER — AMLODIPINE BESYLATE 10 MG PO TABS: 10.0000 mg | ORAL_TABLET | Freq: Every day | ORAL | 5 refills | Status: DC

## 2017-04-09 NOTE — Progress Notes (Signed)
Subjective:  Patient ID: Angel Hebert, female    DOB: 1956/08/29  Age: 61 y.o. MRN: [id number]  CC: Hypertension   HPI Angel Hebert is a 61 -year-old female with a history of hypertension, hyperlipidemia, asthma, allergic rhinitis, nasal polyps (Status post sinus surgery in 10/2016), GERD, bilateral knee osteoarthritis (status post left total knee arthroplasty in 09/2016) who comes in for a follow-up visit.   Hydrochlorothiazide had been discontinued at her last office visit due to hypokalemia 3.3. Her most recent potassium from 03/03/17 was 3.7. She has no other concerns today.  Past Medical History:  Diagnosis Date  . Arthritis   . Asthma    last year in December  . Dyspnea   . GERD (gastroesophageal reflux disease)   . High cholesterol   . Hypertension   . Pneumonia 01/2017    Past Surgical History:  Procedure Laterality Date  . ABDOMINAL HYSTERECTOMY    . nasal polyps     removed just this past tuesday at [place] [facility] facility over by [facility]  . TOTAL KNEE ARTHROPLASTY Left 10/01/2016   Procedure: LEFT TOTAL KNEE ARTHROPLASTY;  Surgeon: Meredith Pel, MD;  Location: [facility];  Service: Orthopedics;  Laterality: Left;    Allergies  Allergen Reactions  . No Known Allergies      Outpatient Medications Prior to Visit  Medication Sig Dispense Refill  . albuterol (PROVENTIL) (2.5 MG/3ML) 0.083% nebulizer solution Take 3 mLs (2.5 mg total) by nebulization every 6 (six) hours as needed for wheezing or shortness of breath. 75 mL 3  . ALL DAY ALLERGY 10 MG tablet TAKE 1 TABLET BY MOUTH DAILY. 30 tablet 2  . atorvastatin (LIPITOR) 10 MG tablet TAKE 1 TABLET BY MOUTH DAILY AT 6 PM FOR CHOLESTEROL 30 tablet 5  . ferrous sulfate 325 (65 FE) MG tablet Take 1 tablet (325 mg total) by mouth 2 (two) times daily with a meal. 60 tablet 3  . fluticasone (FLONASE) 50 MCG/ACT nasal spray Place 2 sprays into both nostrils daily. 16 g 6  . Fluticasone-Salmeterol (ADVAIR) 500-50  MCG/DOSE AEPB Inhale 1 puff into the lungs every 12 (twelve) hours. 60 each 5  . irbesartan (AVAPRO) 150 MG tablet TAKE 1 TABLET BY MOUTH DAILY AFTER BREAKFAST FOR BLOOD PRESSURE 30 tablet 5  . montelukast (SINGULAIR) 10 MG tablet Take 1 tablet (10 mg total) by mouth at bedtime. 30 tablet 5  . naproxen (NAPROSYN) 500 MG tablet Take 1 tablet (500 mg total) by mouth 2 (two) times daily. 30 tablet 0  . pantoprazole (PROTONIX) 20 MG tablet Take 1 tablet (20 mg total) by mouth daily. 30 tablet 5  . PROAIR HFA 108 (90 Base) MCG/ACT inhaler Inhale 2 puffs into the lungs every 6 (six) hours as needed for wheezing or shortness of breath. 1 Inhaler 5  . traMADol (ULTRAM) 50 MG tablet TAKE 1 TABLET EVERY 12 HOURS AS NEEDED FOR PAIN 30 tablet 0  . traZODone (DESYREL) 50 MG tablet Take 1 tablet (50 mg total) by mouth at bedtime. 30 tablet 5  . aspirin EC 325 MG tablet Begin taking aspirin on 10/27/2016 (Patient not taking: Reported on 04/09/2017) 30 tablet 0  . dextromethorphan-guaiFENesin (TUSSIN DM) 10-100 MG/5ML liquid Take 10 mLs by mouth every 4 (four) hours as needed for cough. (Patient not taking: Reported on 04/09/2017) 240 mL 0   No facility-administered medications prior to visit.     ROS Review of Systems  Constitutional: Negative for activity change and appetite change.  HENT: Negative for sinus pressure and sore throat.   Respiratory: Negative for chest tightness, shortness of breath and wheezing.   Cardiovascular: Negative for chest pain and palpitations.  Gastrointestinal: Negative for abdominal distention, abdominal pain and constipation.  Genitourinary: Negative.   Musculoskeletal: Negative.   Psychiatric/Behavioral: Negative for behavioral problems and dysphoric mood.    Objective:  BP (!) 153/83   Pulse 96   Temp 97.8 F (36.6 C) (Oral)   Ht 5\' 7"  (1.702 m)   Wt 161 lb (73 kg)   SpO2 98%   BMI 25.22 kg/m   BP/Weight 04/09/2017 0/38/3338 11/01/9189  Systolic BP 660 600 459    Diastolic BP 83 72 60  Wt. (Lbs) 161 166 167  BMI 25.22 26 26.16      Physical Exam  Constitutional: She is oriented to person, place, and time. She appears well-developed and well-nourished.  Cardiovascular: Normal rate, normal heart sounds and intact distal pulses.   No murmur heard. Pulmonary/Chest: Effort normal and breath sounds normal. She has no wheezes. She has no rales. She exhibits no tenderness.  Abdominal: Soft. Bowel sounds are normal. She exhibits no distension and no mass. There is no tenderness.  Musculoskeletal: Normal range of motion.  Neurological: She is alert and oriented to person, place, and time.     Assessment & Plan:   1. Essential hypertension Uncontrolled Amlodipine added to regimen Continue Avapro Low-sodium, DASH diet, lifestyle modifications - amLODipine (NORVASC) 10 MG tablet; Take 1 tablet (10 mg total) by mouth daily.  Dispense: 30 tablet; Refill: 5   Meds ordered this encounter  Medications  . amLODipine (NORVASC) 10 MG tablet    Sig: Take 1 tablet (10 mg total) by mouth daily.    Dispense:  30 tablet    Refill:  5    Discontinue HCTZ    Follow-up: Return in about 3 months (around 07/10/2017) for Follow-up of chronic medical conditions.   This note has been created with Surveyor, quantity. Any transcriptional errors are unintentional.     Arnoldo Morale MD

## 2017-04-18 ENCOUNTER — Telehealth (INDEPENDENT_AMBULATORY_CARE_PROVIDER_SITE_OTHER): Payer: Self-pay

## 2017-04-18 ENCOUNTER — Other Ambulatory Visit (INDEPENDENT_AMBULATORY_CARE_PROVIDER_SITE_OTHER): Payer: Self-pay | Admitting: Family

## 2017-04-18 MED ORDER — TRAMADOL HCL 50 MG PO TABS: 50.0000 mg | ORAL_TABLET | Freq: Two times a day (BID) | ORAL | 0 refills | Status: DC

## 2017-04-18 NOTE — Telephone Encounter (Signed)
Patient would like a Rx refill on Tramadol.  CB# is [phone].  Please Advise.  Thank You.

## 2017-04-18 NOTE — Telephone Encounter (Signed)
Ok to rf? 

## 2017-04-18 NOTE — Telephone Encounter (Signed)
Called to pharmacy LM for patient advising done.  

## 2017-04-18 NOTE — Addendum Note (Signed)
Addended byLaurann Montana on: 04/18/2017 02:56 PM   Modules accepted: Orders

## 2017-04-30 ENCOUNTER — Telehealth (INDEPENDENT_AMBULATORY_CARE_PROVIDER_SITE_OTHER): Payer: Self-pay | Admitting: Orthopedic Surgery

## 2017-04-30 NOTE — Telephone Encounter (Signed)
Please advise 

## 2017-04-30 NOTE — Telephone Encounter (Signed)
Patient called needing refill on (Tramadol) The number to contact patient is [phone]

## 2017-05-01 NOTE — Telephone Encounter (Signed)
Ok to rf pls cal htx

## 2017-05-02 MED ORDER — TRAMADOL HCL 50 MG PO TABS
50.0000 mg | ORAL_TABLET | Freq: Two times a day (BID) | ORAL | 0 refills | Status: DC
Start: 2017-05-02 — End: 2017-06-02

## 2017-05-02 NOTE — Telephone Encounter (Signed)
Called to pharmacy 

## 2017-06-02 ENCOUNTER — Telehealth (INDEPENDENT_AMBULATORY_CARE_PROVIDER_SITE_OTHER): Payer: Self-pay | Admitting: Orthopedic Surgery

## 2017-06-02 ENCOUNTER — Other Ambulatory Visit (INDEPENDENT_AMBULATORY_CARE_PROVIDER_SITE_OTHER): Payer: Self-pay

## 2017-06-02 MED ORDER — TRAMADOL HCL 50 MG PO TABS: ORAL_TABLET | ORAL | 0 refills | Status: DC

## 2017-06-02 NOTE — Telephone Encounter (Signed)
Y 1 po q day # 30 pls clal thx

## 2017-06-02 NOTE — Telephone Encounter (Signed)
Patient called asking for a refill on tramadol. CB # [phone]

## 2017-06-02 NOTE — Telephone Encounter (Signed)
Ok to rf? 

## 2017-06-02 NOTE — Telephone Encounter (Signed)
IC advised rx called to pharmacy.

## 2017-06-23 ENCOUNTER — Other Ambulatory Visit: Payer: Self-pay | Admitting: Family Medicine

## 2017-06-23 MED ORDER — LOSARTAN POTASSIUM 100 MG PO TABS: 100.0000 mg | ORAL_TABLET | Freq: Every day | ORAL | 5 refills | Status: DC

## 2017-06-28 ENCOUNTER — Other Ambulatory Visit: Payer: Self-pay | Admitting: Family Medicine

## 2017-07-01 ENCOUNTER — Telehealth (INDEPENDENT_AMBULATORY_CARE_PROVIDER_SITE_OTHER): Payer: Self-pay | Admitting: Orthopedic Surgery

## 2017-07-01 MED ORDER — TRAMADOL HCL 50 MG PO TABS
ORAL_TABLET | ORAL | 0 refills | Status: DC
Start: 2017-07-01 — End: 2018-12-01

## 2017-07-01 NOTE — Telephone Encounter (Signed)
Called to pharmacy. IC patient advised done.

## 2017-07-01 NOTE — Telephone Encounter (Signed)
Rx refill Tramadol °

## 2017-07-01 NOTE — Telephone Encounter (Signed)
y

## 2017-07-01 NOTE — Telephone Encounter (Signed)
Ok to rf? 

## 2017-07-02 ENCOUNTER — Telehealth (INDEPENDENT_AMBULATORY_CARE_PROVIDER_SITE_OTHER): Payer: Self-pay | Admitting: Orthopedic Surgery

## 2017-07-02 NOTE — Telephone Encounter (Signed)
IC patient advised would work on PA for medication.

## 2017-07-02 NOTE — Telephone Encounter (Signed)
Patient called saying that Pharmacy is telling her she is needing a prior authorization for her Tramadol. CB # [phone]

## 2017-07-04 ENCOUNTER — Telehealth (INDEPENDENT_AMBULATORY_CARE_PROVIDER_SITE_OTHER): Payer: Self-pay | Admitting: Orthopedic Surgery

## 2017-07-04 NOTE — Telephone Encounter (Signed)
Patient called needing Rx refilled (Tramadol) The number to contact patient is [phone]

## 2017-07-04 NOTE — Telephone Encounter (Signed)
IC advised called to her pharmacy on 10/31

## 2017-07-15 ENCOUNTER — Encounter: Payer: Self-pay | Admitting: Family Medicine

## 2017-07-15 ENCOUNTER — Ambulatory Visit: Payer: Medicaid Other | Attending: Family Medicine | Admitting: Family Medicine

## 2017-07-15 VITALS — BP 145/84 | HR 108 | Temp 98.1°F | Ht 67.0 in | Wt 145.6 lb

## 2017-07-15 DIAGNOSIS — G4700 Insomnia, unspecified: Secondary | ICD-10-CM | POA: Diagnosis not present

## 2017-07-15 DIAGNOSIS — Z96652 Presence of left artificial knee joint: Secondary | ICD-10-CM | POA: Diagnosis not present

## 2017-07-15 DIAGNOSIS — D508 Other iron deficiency anemias: Secondary | ICD-10-CM | POA: Diagnosis not present

## 2017-07-15 DIAGNOSIS — E785 Hyperlipidemia, unspecified: Secondary | ICD-10-CM | POA: Diagnosis not present

## 2017-07-15 DIAGNOSIS — G4709 Other insomnia: Secondary | ICD-10-CM

## 2017-07-15 DIAGNOSIS — Z7982 Long term (current) use of aspirin: Secondary | ICD-10-CM | POA: Diagnosis not present

## 2017-07-15 DIAGNOSIS — J4520 Mild intermittent asthma, uncomplicated: Secondary | ICD-10-CM

## 2017-07-15 DIAGNOSIS — I10 Essential (primary) hypertension: Secondary | ICD-10-CM | POA: Diagnosis not present

## 2017-07-15 DIAGNOSIS — Z23 Encounter for immunization: Secondary | ICD-10-CM | POA: Insufficient documentation

## 2017-07-15 DIAGNOSIS — M170 Bilateral primary osteoarthritis of knee: Secondary | ICD-10-CM | POA: Diagnosis not present

## 2017-07-15 DIAGNOSIS — Z79899 Other long term (current) drug therapy: Secondary | ICD-10-CM | POA: Diagnosis not present

## 2017-07-15 DIAGNOSIS — R2231 Localized swelling, mass and lump, right upper limb: Secondary | ICD-10-CM | POA: Insufficient documentation

## 2017-07-15 DIAGNOSIS — E7800 Pure hypercholesterolemia, unspecified: Secondary | ICD-10-CM | POA: Diagnosis not present

## 2017-07-15 DIAGNOSIS — M1710 Unilateral primary osteoarthritis, unspecified knee: Secondary | ICD-10-CM | POA: Diagnosis not present

## 2017-07-15 DIAGNOSIS — K219 Gastro-esophageal reflux disease without esophagitis: Secondary | ICD-10-CM | POA: Insufficient documentation

## 2017-07-15 MED ORDER — FERROUS SULFATE 325 (65 FE) MG PO TABS: 325.0000 mg | ORAL_TABLET | Freq: Two times a day (BID) | ORAL | 3 refills | Status: DC

## 2017-07-15 MED ORDER — LOSARTAN POTASSIUM 100 MG PO TABS: 100.0000 mg | ORAL_TABLET | Freq: Every day | ORAL | 5 refills | Status: DC

## 2017-07-15 MED ORDER — NAPROXEN 500 MG PO TABS: 500.0000 mg | ORAL_TABLET | Freq: Two times a day (BID) | ORAL | 1 refills | Status: DC

## 2017-07-15 MED ORDER — FLUTICASONE-SALMETEROL 500-50 MCG/DOSE IN AEPB: 1.0000 | INHALATION_SPRAY | Freq: Two times a day (BID) | RESPIRATORY_TRACT | 5 refills | Status: DC

## 2017-07-15 MED ORDER — AMLODIPINE BESYLATE 10 MG PO TABS: 10.0000 mg | ORAL_TABLET | Freq: Every day | ORAL | 5 refills | Status: DC

## 2017-07-15 MED ORDER — ALBUTEROL SULFATE (2.5 MG/3ML) 0.083% IN NEBU: 2.5000 mg | INHALATION_SOLUTION | Freq: Four times a day (QID) | RESPIRATORY_TRACT | 3 refills | Status: DC | PRN

## 2017-07-15 MED ORDER — PROAIR HFA 108 (90 BASE) MCG/ACT IN AERS: 2.0000 | INHALATION_SPRAY | Freq: Four times a day (QID) | RESPIRATORY_TRACT | 5 refills | Status: DC | PRN

## 2017-07-15 MED ORDER — ATORVASTATIN CALCIUM 10 MG PO TABS: ORAL_TABLET | ORAL | 5 refills | Status: DC

## 2017-07-15 MED ORDER — PANTOPRAZOLE SODIUM 20 MG PO TBEC: 20.0000 mg | DELAYED_RELEASE_TABLET | Freq: Every day | ORAL | 5 refills | Status: DC

## 2017-07-15 MED ORDER — MONTELUKAST SODIUM 10 MG PO TABS: 10.0000 mg | ORAL_TABLET | Freq: Every day | ORAL | 5 refills | Status: DC

## 2017-07-15 MED ORDER — TRAZODONE HCL 50 MG PO TABS: 50.0000 mg | ORAL_TABLET | Freq: Every day | ORAL | 5 refills | Status: DC

## 2017-07-15 NOTE — Patient Instructions (Signed)

## 2017-07-15 NOTE — Progress Notes (Signed)
Subjective:  Patient ID: Angel Hebert, female    DOB: Oct 14, 1955  Age: 61 y.o. MRN: [id number]  CC: Hypertension   HPI Angel Hebert is a 61 -year-old female with a history of hypertension, hyperlipidemia, asthma, allergic rhinitis, nasal polyps (Status post sinus surgery in 10/2016), GERD, bilateral knee osteoarthritis (status post left total knee arthroplasty in 09/2016) who comes in for a follow-up visit.  Today she complains of right upper arm swelling for the last 3 weeks to 1 month which is nontender and has progressively increased in size; denies history of trauma.  She has associated pain only when she tries to lie on her right side. Complains of associated tingling in her right hand but has not been dropping things and denies loss of strength in her hand. Left hand is normal.  She is right-handed  Asthma has been controlled and she denies any exacerbation and has been compliant with her inhalers. She also denies flaring up of her allergic rhinitis. Reflux symptoms are controlled on her PPI.  She tolerates her antihypertensive and statin and denies any side effects from medications.  Past Medical History:  Diagnosis Date  . Arthritis   . Asthma    last year in December  . Dyspnea   . GERD (gastroesophageal reflux disease)   . High cholesterol   . Hypertension   . Pneumonia 01/2017    Past Surgical History:  Procedure Laterality Date  . ABDOMINAL HYSTERECTOMY    . nasal polyps     removed just this past tuesday at [facility] facility over by [facility]  . TOTAL KNEE ARTHROPLASTY Left 10/01/2016   Procedure: LEFT TOTAL KNEE ARTHROPLASTY;  Surgeon: Meredith Pel, MD;  Location: [facility] [facility];  Service: Orthopedics;  Laterality: Left;    Allergies  Allergen Reactions  . No Known Allergies      Outpatient Medications Prior to Visit  Medication Sig Dispense Refill  . ALL DAY ALLERGY 10 MG tablet TAKE 1 TABLET BY MOUTH DAILY. 30 tablet 2  . fluticasone (FLONASE)  50 MCG/ACT nasal spray Place 2 sprays into both nostrils daily. 16 g 6  . traMADol (ULTRAM) 50 MG tablet 1 PO Q D PRN PAIN 30 tablet 0  . albuterol (PROVENTIL) (2.5 MG/3ML) 0.083% nebulizer solution Take 3 mLs (2.5 mg total) by nebulization every 6 (six) hours as needed for wheezing or shortness of breath. 75 mL 3  . amLODipine (NORVASC) 10 MG tablet Take 1 tablet (10 mg total) by mouth daily. 30 tablet 5  . atorvastatin (LIPITOR) 10 MG tablet TAKE 1 TABLET BY MOUTH DAILY AT 6 PM FOR CHOLESTEROL 30 tablet 5  . ferrous sulfate 325 (65 FE) MG tablet TAKE 1 TABLET (325 MG TOTAL) BY MOUTH 2 (TWO) TIMES DAILY WITH A MEAL. 60 tablet 0  . Fluticasone-Salmeterol (ADVAIR) 500-50 MCG/DOSE AEPB Inhale 1 puff into the lungs every 12 (twelve) hours. 60 each 5  . losartan (COZAAR) 100 MG tablet Take 1 tablet (100 mg total) by mouth daily. 30 tablet 5  . montelukast (SINGULAIR) 10 MG tablet Take 1 tablet (10 mg total) by mouth at bedtime. 30 tablet 5  . naproxen (NAPROSYN) 500 MG tablet Take 1 tablet (500 mg total) by mouth 2 (two) times daily. 30 tablet 0  . pantoprazole (PROTONIX) 20 MG tablet Take 1 tablet (20 mg total) by mouth daily. 30 tablet 5  . PROAIR HFA 108 (90 Base) MCG/ACT inhaler Inhale 2 puffs into the lungs every 6 (six) hours as needed  for wheezing or shortness of breath. 1 Inhaler 5  . traZODone (DESYREL) 50 MG tablet Take 1 tablet (50 mg total) by mouth at bedtime. 30 tablet 5  . aspirin EC 325 MG tablet Begin taking aspirin on 10/27/2016 (Patient not taking: Reported on 04/09/2017) 30 tablet 0  . dextromethorphan-guaiFENesin (TUSSIN DM) 10-100 MG/5ML liquid Take 10 mLs by mouth every 4 (four) hours as needed for cough. (Patient not taking: Reported on 04/09/2017) 240 mL 0   No facility-administered medications prior to visit.     ROS Review of Systems  Constitutional: Negative for activity change, appetite change and fatigue.  HENT: Negative for congestion, sinus pressure and sore throat.     Eyes: Negative for visual disturbance.  Respiratory: Negative for cough, chest tightness, shortness of breath and wheezing.   Cardiovascular: Negative for chest pain and palpitations.  Gastrointestinal: Negative for abdominal distention, abdominal pain and constipation.  Endocrine: Negative for polydipsia.  Genitourinary: Negative for dysuria and frequency.  Musculoskeletal:       See hpi  Skin: Negative for rash.  Neurological: Negative for tremors, light-headedness and numbness.  Hematological: Does not bruise/bleed easily.  Psychiatric/Behavioral: Negative for agitation and behavioral problems.    Objective:  BP (!) 145/84   Pulse (!) 108   Temp 98.1 F (36.7 C) (Oral)   Ht 5\' 7"  (1.702 m)   Wt 145 lb 9.6 oz (66 kg)   SpO2 99%   BMI 22.80 kg/m   BP/Weight 07/15/2017 04/09/2017 11/23/4008  Systolic BP 272 536 644  Diastolic BP 84 83 72  Wt. (Lbs) 145.6 161 166  BMI 22.8 25.22 26      Physical Exam  Constitutional: She is oriented to person, place, and time. She appears well-developed and well-nourished.  Cardiovascular: Normal rate, normal heart sounds and intact distal pulses.  No murmur heard. Pulmonary/Chest: Effort normal and breath sounds normal. She has no wheezes. She has no rales. She exhibits no tenderness.  Abdominal: Soft. Bowel sounds are normal. She exhibits no distension and no mass. There is no tenderness.  Musculoskeletal: Normal range of motion.  Left knee vertical scar, mild edema of left knee Right is normal  Neurological: She is alert and oriented to person, place, and time.     CBC    Component Value Date/Time   WBC 9.9 03/03/2017 1129   WBC 22.4 (H) 02/05/2017 0539   RBC 3.52 (L) 03/03/2017 1129   RBC 3.21 (L) 02/05/2017 0539   HGB 9.5 (L) 03/03/2017 1129   HCT 30.7 (L) 03/03/2017 1129   PLT 515 (H) 03/03/2017 1129   MCV 87 03/03/2017 1129   MCH 27.0 03/03/2017 1129   MCH 26.2 02/05/2017 0539   MCHC 30.9 (L) 03/03/2017 1129   MCHC  31.7 02/05/2017 0539   RDW 19.3 (H) 03/03/2017 1129   LYMPHSABS 2.6 03/03/2017 1129   MONOABS 1.1 (H) 02/04/2017 1030   EOSABS 1.3 (H) 03/03/2017 1129   BASOSABS 0.1 03/03/2017 1129    Assessment & Plan:   1. Mild intermittent asthma without complication Controlled - albuterol (PROVENTIL) (2.5 MG/3ML) 0.083% nebulizer solution; Take 3 mLs (2.5 mg total) every 6 (six) hours as needed by nebulization for wheezing or shortness of breath.  Dispense: 75 mL; Refill: 3 - Fluticasone-Salmeterol (ADVAIR) 500-50 MCG/DOSE AEPB; Inhale 1 puff every 12 (twelve) hours into the lungs.  Dispense: 60 each; Refill: 5 - montelukast (SINGULAIR) 10 MG tablet; Take 1 tablet (10 mg total) at bedtime by mouth.  Dispense: 30 tablet;  Refill: 5 - PROAIR HFA 108 (90 Base) MCG/ACT inhaler; Inhale 2 puffs every 6 (six) hours as needed into the lungs for wheezing or shortness of breath.  Dispense: 1 Inhaler; Refill: 5  2. Essential hypertension Uncontrolled Low sodium, DASH diet, lifestyle modifications If blood pressure is still elevated at next visit, I will adjust her medication regimen. - amLODipine (NORVASC) 10 MG tablet; Take 1 tablet (10 mg total) daily by mouth.  Dispense: 30 tablet; Refill: 5 - losartan (COZAAR) 100 MG tablet; Take 1 tablet (100 mg total) daily by mouth.  Dispense: 30 tablet; Refill: 5  3. Pure hypercholesterolemia Controlled - atorvastatin (LIPITOR) 10 MG tablet; TAKE 1 TABLET BY MOUTH DAILY AT 6 PM FOR CHOLESTEROL  Dispense: 30 tablet; Refill: 5  4. Gastroesophageal reflux disease, esophagitis presence not specified Stable - pantoprazole (PROTONIX) 20 MG tablet; Take 1 tablet (20 mg total) daily by mouth.  Dispense: 30 tablet; Refill: 5  5. Other insomnia Controlled - traZODone (DESYREL) 50 MG tablet; Take 1 tablet (50 mg total) at bedtime by mouth.  Dispense: 30 tablet; Refill: 5  6. Need for influenza vaccination - Flu Vaccine QUAD 36+ mos IM  7. Mass of right upper  extremity Suspicious for cyst - [place] RT UPPER EXTREM LTD SOFT TISSUE NON VASCULAR; Future  8. Arthritis of knee Improved Status post left knee replacement She receives tramadol from her orthopedic - naproxen (NAPROSYN) 500 MG tablet; Take 1 tablet (500 mg total) 2 (two) times daily by mouth.  Dispense: 30 tablet; Refill: 1  9. Other iron deficiency anemia Last hemoglobin was 9.5 - ferrous sulfate 325 (65 FE) MG tablet; Take 1 tablet (325 mg total) 2 (two) times daily with a meal by mouth.  Dispense: 60 tablet; Refill: 3   Meds ordered this encounter  Medications  . albuterol (PROVENTIL) (2.5 MG/3ML) 0.083% nebulizer solution    Sig: Take 3 mLs (2.5 mg total) every 6 (six) hours as needed by nebulization for wheezing or shortness of breath.    Dispense:  75 mL    Refill:  3  . amLODipine (NORVASC) 10 MG tablet    Sig: Take 1 tablet (10 mg total) daily by mouth.    Dispense:  30 tablet    Refill:  5  . atorvastatin (LIPITOR) 10 MG tablet    Sig: TAKE 1 TABLET BY MOUTH DAILY AT 6 PM FOR CHOLESTEROL    Dispense:  30 tablet    Refill:  5  . ferrous sulfate 325 (65 FE) MG tablet    Sig: Take 1 tablet (325 mg total) 2 (two) times daily with a meal by mouth.    Dispense:  60 tablet    Refill:  3    Must have office visit for refills  . Fluticasone-Salmeterol (ADVAIR) 500-50 MCG/DOSE AEPB    Sig: Inhale 1 puff every 12 (twelve) hours into the lungs.    Dispense:  60 each    Refill:  5  . losartan (COZAAR) 100 MG tablet    Sig: Take 1 tablet (100 mg total) daily by mouth.    Dispense:  30 tablet    Refill:  5    Discontinue Avapro  . montelukast (SINGULAIR) 10 MG tablet    Sig: Take 1 tablet (10 mg total) at bedtime by mouth.    Dispense:  30 tablet    Refill:  5  . pantoprazole (PROTONIX) 20 MG tablet    Sig: Take 1 tablet (20 mg total) daily by  mouth.    Dispense:  30 tablet    Refill:  5  . PROAIR HFA 108 (90 Base) MCG/ACT inhaler    Sig: Inhale 2 puffs every 6 (six) hours  as needed into the lungs for wheezing or shortness of breath.    Dispense:  1 Inhaler    Refill:  5  . traZODone (DESYREL) 50 MG tablet    Sig: Take 1 tablet (50 mg total) at bedtime by mouth.    Dispense:  30 tablet    Refill:  5  . naproxen (NAPROSYN) 500 MG tablet    Sig: Take 1 tablet (500 mg total) 2 (two) times daily by mouth.    Dispense:  30 tablet    Refill:  1    Follow-up: Return in about 3 weeks (around 08/05/2017) for Complete physical exam and follow-up of arm mass.   Arnoldo Morale MD

## 2017-07-17 ENCOUNTER — Encounter: Payer: Self-pay | Admitting: Family Medicine

## 2017-07-17 DIAGNOSIS — Z23 Encounter for immunization: Secondary | ICD-10-CM | POA: Diagnosis not present

## 2017-07-18 ENCOUNTER — Ambulatory Visit (HOSPITAL_COMMUNITY)
Admission: RE | Admit: 2017-07-18 | Discharge: 2017-07-18 | Disposition: A | Discharge status: Home / Self Care | Payer: Medicaid Other | Source: Ambulatory Visit | Attending: Family Medicine | Admitting: Family Medicine

## 2017-07-18 DIAGNOSIS — R2231 Localized swelling, mass and lump, right upper limb: Secondary | ICD-10-CM | POA: Insufficient documentation

## 2017-07-19 ENCOUNTER — Other Ambulatory Visit: Payer: Self-pay | Admitting: Internal Medicine

## 2017-07-19 ENCOUNTER — Other Ambulatory Visit: Payer: Self-pay | Admitting: Family Medicine

## 2017-07-21 ENCOUNTER — Telehealth: Payer: Self-pay

## 2017-07-21 NOTE — Telephone Encounter (Signed)
Pt was called and informed of ultrasound results. 

## 2017-07-21 NOTE — Telephone Encounter (Signed)
Pt was informed to return in 3 weeks for arm mass and physical.

## 2017-07-21 NOTE — Telephone Encounter (Signed)
Pt. Called requesting an appt. With PCP for the first week in December. Pt. States that her PCP told her to come back in 3 week. Pt. Was informed that she was booked up and that she had an appt. Scheduled for 12/19. Pt. Would like to speak with her PCP to see if it's ok to wait until 12/19. Please f/u

## 2017-07-26 ENCOUNTER — Other Ambulatory Visit: Payer: Self-pay | Admitting: Internal Medicine

## 2017-07-30 ENCOUNTER — Encounter (INDEPENDENT_AMBULATORY_CARE_PROVIDER_SITE_OTHER): Payer: Self-pay | Admitting: Orthopedic Surgery

## 2017-07-30 ENCOUNTER — Ambulatory Visit (INDEPENDENT_AMBULATORY_CARE_PROVIDER_SITE_OTHER): Payer: Medicaid Other

## 2017-07-30 ENCOUNTER — Ambulatory Visit (INDEPENDENT_AMBULATORY_CARE_PROVIDER_SITE_OTHER): Payer: Medicaid Other | Admitting: Orthopedic Surgery

## 2017-07-30 DIAGNOSIS — M546 Pain in thoracic spine: Secondary | ICD-10-CM

## 2017-07-30 DIAGNOSIS — M25511 Pain in right shoulder: Secondary | ICD-10-CM

## 2017-07-30 MED ORDER — TRAMADOL HCL 50 MG PO TABS: 50.0000 mg | ORAL_TABLET | Freq: Two times a day (BID) | ORAL | 0 refills | Status: DC | PRN

## 2017-08-03 NOTE — Progress Notes (Signed)
Office Visit Note   Patient: Angel Hebert           Date of Birth: Oct 28, 1955           MRN: [id number] Visit Date: 07/30/2017 Requested by: Arnoldo Morale, MD [place], [place] [address] PCP: Arnoldo Morale, MD  Subjective: Chief Complaint  Patient presents with  . Right Shoulder - Pain  . Middle Back - Pain    HPI: Patient presents with right arm mass shoulder region.  He states that is been ongoing for 1 month.  She reports some numbness and tingling in her hand.  Denies any trauma to the right shoulder region.  States there is a similar mass on her back on the left-hand side.  On extensive questioning demonstrates not a clear timeline as far as how long the mass has been there.  Her husband noticed a mass on the back.  They believe that it has been going on at least a month if not longer.              ROS: All systems reviewed are negative as they relate to the chief complaint within the history of present illness.  Patient denies  fevers or chills.   Assessment & Plan: Visit Diagnoses:  1. Pain in thoracic spine   2. 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  Ultrasound examination has been performed elsewhere.  By the mass measures about 2 x 6 cm and is freely mobile beneath the dermal layer and is not fixed to the fascial layer.  Only mild tenderness to palpation.  A similar sized mass is present on the left upper back.  Plan is Ultram prescription and MRI with IV contrast of that right shoulder to evaluate for lipoma versus liposarcoma.  I discussed this case with the musculoskeletal radiologist described a fairly high likelihood of being able to determine with MRI scan and whether or not.  Based primarily on enhancement and central necrosis within the lesion.  I will see her back after that study.  Follow-Up Instructions: Return for after MRI.   Orders:  Orders Placed This Encounter  Procedures  . XR  Thoracic Spine 2 View  . XR Shoulder Right  . MR SHOULDER RIGHT W CONTRAST   Meds ordered this encounter  Medications  . traMADol (ULTRAM) 50 MG tablet    Sig: Take 1 tablet (50 mg total) by mouth every 12 (twelve) hours as needed.    Dispense:  60 tablet    Refill:  0      Procedures: No procedures performed   Clinical Data: No additional findings.  Objective: Vital Signs: There were no vitals taken for this visit.  Physical Exam:   Constitutional: Patient appears well-developed HEENT:  Head: Normocephalic Eyes:EOM are normal Neck: Normal range of motion Cardiovascular: Normal rate Pulmonary/chest: Effort normal Neurologic: Patient is alert Skin: Skin is warm Psychiatric: Patient has normal mood and affect    Ortho Exam: Orthopedic exam demonstrates soft tissue mass in the right shoulder region.  Mobile beneath the skin.  Has consistency of motor sensory function to the arm is intact.  Rotator cuff strength is intact.  Range of motion right shoulder is full.  Similar lipoma palpable in the upper thoracic region on the left-hand side.  No scapular dyskinesia in this region.  Specialty Comments:  No specialty comments available.  Imaging: No results found.   PMFS History: Patient  Active Problem List   Diagnosis Date Noted  . Insomnia 03/03/2017  . 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 Presence of left artificial knee joint 10/14/2016  . Arthritis of knee 10/01/2016  . Unilateral primary osteoarthritis, left knee 12/13/2015  . Benign essential HTN   . Physical deconditioning   . Anemia, iron deficiency   . Pressure ulcer 10/14/2015  . CAP (community acquired pneumonia)   . Pleural effusion   . Sepsis ([place])   . Sepsis due to Streptococcus pneumoniae ([place])   . Bacteremia   . Community acquired pneumonia 10/08/2015  . Acute sinusitis 12/02/2014  . Sinusitis 03/17/2013  . Hyperlipidemia 03/02/2007  . Essential hypertension 03/02/2007  .  Allergic rhinitis 03/02/2007  . Asthma 03/02/2007  . GERD 03/02/2007   Past Medical History:  Diagnosis Date  . Arthritis   . Asthma    last year in December  . Dyspnea   . GERD (gastroesophageal reflux disease)   . High cholesterol   . Hypertension   . Pneumonia 01/2017    Family History  Problem Relation Age of Onset  . Diabetes Sister   . Colon cancer Neg Hx     Past Surgical History:  Procedure Laterality Date  . ABDOMINAL HYSTERECTOMY    . nasal polyps     removed just this past tuesday at [facility] over by [facility]  . TOTAL KNEE ARTHROPLASTY Left 10/01/2016   Procedure: LEFT TOTAL KNEE ARTHROPLASTY;  Surgeon: Meredith Pel, MD;  Location: [place];  Service: Orthopedics;  Laterality: Left;   Social History   Occupational History  . Not on file  Tobacco Use  . Smoking status: Never Smoker  . Smokeless tobacco: Never Used  Substance and Sexual Activity  . Alcohol use: No    Comment: Quit years ago  . Drug use: No  . Sexual activity: Not on file

## 2017-08-13 ENCOUNTER — Telehealth (INDEPENDENT_AMBULATORY_CARE_PROVIDER_SITE_OTHER): Payer: Self-pay | Admitting: Radiology

## 2017-08-13 NOTE — Telephone Encounter (Signed)
Patient called, LMVM so I called her back and she had questions about the MRI, I told her to go for the scan, then when she sees Dr Jarold Song after that they can determine if Dr Marlou Sa needs to see her or not again.  Patient understands. She will go for MRI on 08/15/17 and then see Dr Jarold Song on 08/20/17.

## 2017-08-15 ENCOUNTER — Other Ambulatory Visit: Payer: Medicaid Other

## 2017-08-20 ENCOUNTER — Ambulatory Visit: Payer: Medicaid Other | Admitting: Family Medicine

## 2017-09-09 ENCOUNTER — Ambulatory Visit: Payer: Medicaid Other | Admitting: Family Medicine

## 2017-09-22 ENCOUNTER — Encounter: Payer: Medicaid Other | Admitting: Family Medicine

## 2017-10-03 ENCOUNTER — Other Ambulatory Visit: Payer: Self-pay | Admitting: Family Medicine

## 2017-10-03 DIAGNOSIS — M1710 Unilateral primary osteoarthritis, unspecified knee: Secondary | ICD-10-CM

## 2017-10-05 ENCOUNTER — Other Ambulatory Visit (INDEPENDENT_AMBULATORY_CARE_PROVIDER_SITE_OTHER): Payer: Self-pay | Admitting: Orthopedic Surgery

## 2017-10-06 NOTE — Telephone Encounter (Signed)
Called to pharmacy 

## 2017-10-06 NOTE — Telephone Encounter (Signed)
Ok to rf? 

## 2017-10-06 NOTE — Telephone Encounter (Signed)
y

## 2017-10-08 ENCOUNTER — Ambulatory Visit: Payer: Medicaid Other | Attending: Family Medicine | Admitting: Family Medicine

## 2017-10-08 ENCOUNTER — Encounter: Payer: Self-pay | Admitting: Family Medicine

## 2017-10-08 VITALS — BP 168/99 | HR 95 | Temp 97.8°F | Ht 67.0 in | Wt 139.4 lb

## 2017-10-08 DIAGNOSIS — K219 Gastro-esophageal reflux disease without esophagitis: Secondary | ICD-10-CM | POA: Insufficient documentation

## 2017-10-08 DIAGNOSIS — M19019 Primary osteoarthritis, unspecified shoulder: Secondary | ICD-10-CM | POA: Diagnosis not present

## 2017-10-08 DIAGNOSIS — Z79899 Other long term (current) drug therapy: Secondary | ICD-10-CM | POA: Diagnosis not present

## 2017-10-08 DIAGNOSIS — Z1239 Encounter for other screening for malignant neoplasm of breast: Secondary | ICD-10-CM

## 2017-10-08 DIAGNOSIS — D6489 Other specified anemias: Secondary | ICD-10-CM

## 2017-10-08 DIAGNOSIS — D649 Anemia, unspecified: Secondary | ICD-10-CM | POA: Diagnosis not present

## 2017-10-08 DIAGNOSIS — M19011 Primary osteoarthritis, right shoulder: Secondary | ICD-10-CM | POA: Insufficient documentation

## 2017-10-08 DIAGNOSIS — I10 Essential (primary) hypertension: Secondary | ICD-10-CM | POA: Insufficient documentation

## 2017-10-08 DIAGNOSIS — E7800 Pure hypercholesterolemia, unspecified: Secondary | ICD-10-CM | POA: Insufficient documentation

## 2017-10-08 DIAGNOSIS — Z1231 Encounter for screening mammogram for malignant neoplasm of breast: Secondary | ICD-10-CM

## 2017-10-08 DIAGNOSIS — J45909 Unspecified asthma, uncomplicated: Secondary | ICD-10-CM | POA: Insufficient documentation

## 2017-10-08 DIAGNOSIS — Z0000 Encounter for general adult medical examination without abnormal findings: Secondary | ICD-10-CM

## 2017-10-08 MED ORDER — GABAPENTIN 300 MG PO CAPS: 300.0000 mg | ORAL_CAPSULE | Freq: Two times a day (BID) | ORAL | 3 refills | Status: DC

## 2017-10-08 NOTE — Patient Instructions (Signed)

## 2017-10-08 NOTE — Progress Notes (Signed)
Subjective:  Patient ID: Angel Hebert, female    DOB: [birth year]  Age: 62 y.o. MRN: [id number]  CC: Annual Exam   HPI [name] presents for a complete physical exam. She had an ultrasound of left upper arm mass 2 months ago which revealed a lipoma but she complains of right shoulder pain not relieved by Tramadol which she received from her Orthopedic - Dr Marlou Sa. Pain radiates down R arm with associated tingling.  Past Medical History:  Diagnosis Date  . Arthritis   . Asthma    last year in December  . Dyspnea   . GERD (gastroesophageal reflux disease)   . High cholesterol   . Hypertension   . Pneumonia 01/2017    Past Surgical History:  Procedure Laterality Date  . ABDOMINAL HYSTERECTOMY    . nasal polyps     removed just this past tuesday at [facility] facility over by [facility]  . TOTAL KNEE ARTHROPLASTY Left 10/01/2016   Procedure: LEFT TOTAL KNEE ARTHROPLASTY;  Surgeon: Meredith Pel, MD;  Location: [place];  Service: Orthopedics;  Laterality: Left;    Allergies  Allergen Reactions  . No Known Allergies       Outpatient Medications Prior to Visit  Medication Sig Dispense Refill  . albuterol (PROVENTIL) (2.5 MG/3ML) 0.083% nebulizer solution Take 3 mLs (2.5 mg total) every 6 (six) hours as needed by nebulization for wheezing or shortness of breath. 75 mL 3  . ALL DAY ALLERGY 10 MG tablet TAKE 1 TABLET BY MOUTH DAILY. 30 tablet 2  . amLODipine (NORVASC) 10 MG tablet Take 1 tablet (10 mg total) daily by mouth. 30 tablet 5  . atorvastatin (LIPITOR) 10 MG tablet TAKE 1 TABLET BY MOUTH DAILY AT 6 PM FOR CHOLESTEROL 30 tablet 5  . ferrous sulfate 325 (65 FE) MG tablet Take 1 tablet (325 mg total) 2 (two) times daily with a meal by mouth. 60 tablet 3  . fluticasone (FLONASE) 50 MCG/ACT nasal spray Place 2 sprays into both nostrils daily. 16 g 6  . Fluticasone-Salmeterol (ADVAIR) 500-50 MCG/DOSE AEPB Inhale 1 puff every 12 (twelve) hours into the lungs. 60  each 5  . losartan (COZAAR) 100 MG tablet Take 1 tablet (100 mg total) daily by mouth. 30 tablet 5  . montelukast (SINGULAIR) 10 MG tablet Take 1 tablet (10 mg total) at bedtime by mouth. 30 tablet 5  . naproxen (NAPROSYN) 500 MG tablet TAKE 1 TABLET (500 MG TOTAL) 2 (TWO) TIMES DAILY BY MOUTH. 30 tablet 1  . pantoprazole (PROTONIX) 20 MG tablet Take 1 tablet (20 mg total) daily by mouth. 30 tablet 5  . PROAIR HFA 108 (90 Base) MCG/ACT inhaler Inhale 2 puffs every 6 (six) hours as needed into the lungs for wheezing or shortness of breath. 1 Inhaler 5  . traMADol (ULTRAM) 50 MG tablet Take 1 tablet (50 mg total) by mouth every 12 (twelve) hours as needed. 60 tablet 0  . traMADol (ULTRAM) 50 MG tablet TAKE 1 TABLET BY MOUTH TWICE A DAY AS NEEDED FOR PAIN 60 tablet 0  . traZODone (DESYREL) 50 MG tablet Take 1 tablet (50 mg total) at bedtime by mouth. 30 tablet 5  . aspirin EC 325 MG tablet Begin taking aspirin on 10/27/2016 (Patient not taking: Reported on 04/09/2017) 30 tablet 0  . dextromethorphan-guaiFENesin (TUSSIN DM) 10-100 MG/5ML liquid Take 10 mLs by mouth every 4 (four) hours as needed for cough. (Patient not taking: Reported on 04/09/2017) 240 mL 0  .  traMADol (ULTRAM) 50 MG tablet 1 PO Q D PRN PAIN (Patient not taking: Reported on 10/08/2017) 30 tablet 0   No facility-administered medications prior to visit.     ROS Review of Systems  Constitutional: Negative for activity change, appetite change and fatigue.  HENT: Negative for congestion, sinus pressure and sore throat.   Eyes: Negative for visual disturbance.  Respiratory: Negative for cough, chest tightness, shortness of breath and wheezing.   Cardiovascular: Negative for chest pain and palpitations.  Gastrointestinal: Negative for abdominal distention, abdominal pain and constipation.  Endocrine: Negative for polydipsia.  Genitourinary: Negative for dysuria and frequency.  Musculoskeletal:       See hpi  Skin: Negative for rash.    Neurological: Negative for tremors, light-headedness and numbness.  Hematological: Does not bruise/bleed easily.  Psychiatric/Behavioral: Negative for agitation and behavioral problems.  All other systems reviewed and are negative.   Objective:  BP (!) 168/99   Pulse 95   Temp 97.8 F (36.6 C) (Oral)   Ht _0  (1.702 m)   Wt 139 lb 6.4 oz (63.2 kg)   SpO2 98%   BMI 21.83 kg/m   BP/Weight 10/08/2017 56/43/3295 09/09/8414  Systolic BP 606 301 601  Diastolic BP 99 84 83  Wt. (Lbs) 139.4 145.6 161  BMI 21.83 22.8 25.22      Physical Exam  Constitutional: She is oriented to person, place, and time. She appears well-developed and well-nourished. No distress.  HENT:  Head: Normocephalic.  Right Ear: External ear normal.  Left Ear: External ear normal.  Nose: Nose normal.  Mouth/Throat: Oropharynx is clear and moist.  Eyes: Conjunctivae and EOM are normal. Pupils are equal, round, and reactive to light.  Neck: Normal range of motion. No JVD present.  Cardiovascular: Normal rate, regular rhythm, normal heart sounds and intact distal pulses. Exam reveals no gallop.  No murmur heard. Pulmonary/Chest: Effort normal and breath sounds normal. No respiratory distress. She has no wheezes. She has no rales. She exhibits no tenderness. Right breast exhibits no mass and no tenderness. Left breast exhibits no mass and no tenderness.  Abdominal: Soft. Bowel sounds are normal. She exhibits no distension and no mass. There is no tenderness.  Musculoskeletal: She exhibits tenderness (TTP of right shoulder and on ROM). She exhibits no edema.  Neurological: She is alert and oriented to person, place, and time. She has normal reflexes.  Skin: She is not diaphoretic.  Lipoma on right arm which is not tender  Psychiatric: She has a normal mood and affect.     Assessment & Plan:   1. Annual physical exam Counseled on 150 minutes of exercise per week, healthy eating (including decreased daily  intake of saturated fats, cholesterol, added sugars, sodium), STI prevention, routine healthcare maintenance. - Hepatitis c antibody (reflex); Future  2. Shoulder arthritis Currently on Naproxen and Tramadol Gabapentin added due to impingement symptoms  3. Screening for breast cancer - MM Digital Screening; Future  4. Benign essential HTN Uncontrolled Regimen changes if still elevated at next visit - CMP14+EGFR; Future - Lipid panel; Future  5. Anemia due to other cause, not classified - CBC with Differential/Platelet; Future   Meds ordered this encounter  Medications  . gabapentin (NEURONTIN) 300 MG capsule    Sig: Take 1 capsule (300 mg total) by mouth 2 (two) times daily.    Dispense:  60 capsule    Refill:  3    Follow-up: Return in about 3 months (around 01/05/2018) for Follow-up of  chronic medical conditions.   Charlott Rakes MD

## 2017-10-15 ENCOUNTER — Other Ambulatory Visit: Payer: Medicaid Other

## 2017-10-22 ENCOUNTER — Other Ambulatory Visit: Payer: Medicaid Other

## 2017-10-29 ENCOUNTER — Ambulatory Visit: Payer: Medicaid Other | Attending: Family Medicine

## 2017-10-29 DIAGNOSIS — D6489 Other specified anemias: Secondary | ICD-10-CM | POA: Diagnosis not present

## 2017-10-29 DIAGNOSIS — Z0000 Encounter for general adult medical examination without abnormal findings: Secondary | ICD-10-CM

## 2017-10-29 DIAGNOSIS — I10 Essential (primary) hypertension: Secondary | ICD-10-CM

## 2017-10-29 NOTE — Progress Notes (Signed)
Patient here for lab visit  

## 2017-10-30 LAB — LIPID PANEL
CHOL/HDL RATIO: 1.7 ratio (ref 0.0–4.4)
CHOLESTEROL TOTAL: 189 mg/dL (ref 100–199)
HDL: 110 mg/dL (ref >39)
LDL Calculated: 21 mg/dL (ref 0–99)
TRIGLYCERIDES: 289 mg/dL — AB (ref 0–149)
VLDL Cholesterol Cal: 58 mg/dL — ABNORMAL HIGH (ref 5–40)

## 2017-10-30 LAB — CMP14+EGFR
ALK PHOS: 125 IU/L — AB (ref 39–117)
ALT: 14 IU/L (ref 0–32)
AST: 34 IU/L (ref 0–40)
Albumin/Globulin Ratio: 1.6 (ref 1.2–2.2)
Albumin: 4.4 g/dL (ref 3.6–4.8)
BUN/Creatinine Ratio: 21 (ref 12–28)
BUN: 14 mg/dL (ref 8–27)
Bilirubin Total: 0.4 mg/dL (ref 0.0–1.2)
CALCIUM: 9.4 mg/dL (ref 8.7–10.3)
CO2: 23 mmol/L (ref 20–29)
CREATININE: 0.67 mg/dL (ref 0.57–1.00)
Chloride: 102 mmol/L (ref 96–106)
GFR calc Af Amer: 110 mL/min/{1.73_m2} (ref >59)
GFR, EST NON AFRICAN AMERICAN: 96 mL/min/{1.73_m2} (ref >59)
GLOBULIN, TOTAL: 2.7 g/dL (ref 1.5–4.5)
GLUCOSE: 77 mg/dL (ref 65–99)
Potassium: 4.3 mmol/L (ref 3.5–5.2)
SODIUM: 143 mmol/L (ref 134–144)
Total Protein: 7.1 g/dL (ref 6.0–8.5)

## 2017-10-30 LAB — CBC WITH DIFFERENTIAL/PLATELET
BASOS ABS: 0.1 10*3/uL (ref 0.0–0.2)
Basos: 1 %
EOS (ABSOLUTE): 0.4 10*3/uL (ref 0.0–0.4)
Eos: 7 %
HEMATOCRIT: 37.6 % (ref 34.0–46.6)
Hemoglobin: 12.5 g/dL (ref 11.1–15.9)
Immature Grans (Abs): 0 10*3/uL (ref 0.0–0.1)
Immature Granulocytes: 0 %
LYMPHS ABS: 1.7 10*3/uL (ref 0.7–3.1)
Lymphs: 26 %
MCH: 33.2 pg — ABNORMAL HIGH (ref 26.6–33.0)
MCHC: 33.2 g/dL (ref 31.5–35.7)
MCV: 100 fL — ABNORMAL HIGH (ref 79–97)
MONOS ABS: 0.5 10*3/uL (ref 0.1–0.9)
Monocytes: 8 %
Neutrophils Absolute: 3.8 10*3/uL (ref 1.4–7.0)
Neutrophils: 58 %
Platelets: 415 10*3/uL — ABNORMAL HIGH (ref 150–379)
RBC: 3.77 x10E6/uL (ref 3.77–5.28)
RDW: 16.4 % — AB (ref 12.3–15.4)
WBC: 6.6 10*3/uL (ref 3.4–10.8)

## 2017-10-30 LAB — HCV COMMENT:

## 2017-10-30 LAB — HEPATITIS C ANTIBODY (REFLEX)

## 2017-10-31 ENCOUNTER — Telehealth: Payer: Self-pay

## 2017-10-31 NOTE — Telephone Encounter (Signed)
Patient was called and informed of lab results. 

## 2017-11-04 ENCOUNTER — Other Ambulatory Visit (INDEPENDENT_AMBULATORY_CARE_PROVIDER_SITE_OTHER): Payer: Self-pay | Admitting: Orthopedic Surgery

## 2017-11-04 NOTE — Telephone Encounter (Signed)
Ok to rf? 

## 2017-11-05 NOTE — Telephone Encounter (Signed)
y

## 2017-11-06 ENCOUNTER — Telehealth (INDEPENDENT_AMBULATORY_CARE_PROVIDER_SITE_OTHER): Payer: Self-pay | Admitting: Orthopedic Surgery

## 2017-11-06 NOTE — Telephone Encounter (Signed)
Patient called advised the pharmacy is waiting for an approval before they will fill her Rx (Tramadol) Patient advised she has medicaid. Patient asked for a call back. The number to contact patient is [phone]

## 2017-11-06 NOTE — Telephone Encounter (Signed)
IC advised submitted prior auth yesterday and it is pending. Will call once I know if auth'd

## 2017-11-21 ENCOUNTER — Ambulatory Visit: Payer: Medicaid Other

## 2017-11-26 ENCOUNTER — Other Ambulatory Visit: Payer: Self-pay | Admitting: Family Medicine

## 2017-11-26 DIAGNOSIS — M1710 Unilateral primary osteoarthritis, unspecified knee: Secondary | ICD-10-CM

## 2017-12-16 ENCOUNTER — Ambulatory Visit: Payer: Medicaid Other

## 2017-12-17 ENCOUNTER — Ambulatory Visit
Admission: RE | Admit: 2017-12-17 | Discharge: 2017-12-17 | Disposition: A | Discharge status: Home / Self Care | Payer: Medicaid Other | Source: Ambulatory Visit | Attending: Family Medicine | Admitting: Family Medicine

## 2017-12-17 DIAGNOSIS — Z1239 Encounter for other screening for malignant neoplasm of breast: Secondary | ICD-10-CM

## 2017-12-17 DIAGNOSIS — Z1231 Encounter for screening mammogram for malignant neoplasm of breast: Secondary | ICD-10-CM | POA: Diagnosis not present

## 2017-12-18 ENCOUNTER — Telehealth: Payer: Self-pay

## 2017-12-18 ENCOUNTER — Ambulatory Visit: Payer: Medicaid Other

## 2017-12-18 NOTE — Telephone Encounter (Signed)
Patient was called and informed of ab results.

## 2017-12-26 ENCOUNTER — Other Ambulatory Visit: Payer: Self-pay | Admitting: Family Medicine

## 2017-12-26 DIAGNOSIS — M1710 Unilateral primary osteoarthritis, unspecified knee: Secondary | ICD-10-CM

## 2017-12-30 ENCOUNTER — Other Ambulatory Visit: Payer: Self-pay | Admitting: Family Medicine

## 2018-01-06 ENCOUNTER — Ambulatory Visit: Payer: Medicaid Other | Admitting: Family Medicine

## 2018-01-23 ENCOUNTER — Other Ambulatory Visit: Payer: Self-pay | Admitting: Family Medicine

## 2018-01-23 DIAGNOSIS — M1710 Unilateral primary osteoarthritis, unspecified knee: Secondary | ICD-10-CM

## 2018-02-23 ENCOUNTER — Other Ambulatory Visit: Payer: Self-pay | Admitting: Family Medicine

## 2018-02-23 DIAGNOSIS — M1710 Unilateral primary osteoarthritis, unspecified knee: Secondary | ICD-10-CM

## 2018-03-31 ENCOUNTER — Other Ambulatory Visit: Payer: Self-pay | Admitting: Family Medicine

## 2018-03-31 DIAGNOSIS — E7800 Pure hypercholesterolemia, unspecified: Secondary | ICD-10-CM

## 2018-03-31 DIAGNOSIS — J4520 Mild intermittent asthma, uncomplicated: Secondary | ICD-10-CM

## 2018-03-31 DIAGNOSIS — M1710 Unilateral primary osteoarthritis, unspecified knee: Secondary | ICD-10-CM

## 2018-04-02 ENCOUNTER — Other Ambulatory Visit: Payer: Self-pay | Admitting: Family Medicine

## 2018-04-18 IMAGING — DX DG CHEST 2V
2 series · 2 of 2 positions shown · non-contrast
Comparison: 02/04/2017

CLINICAL DATA: Pneumonia

EXAM:
CHEST  2 VIEW

[chest pa]
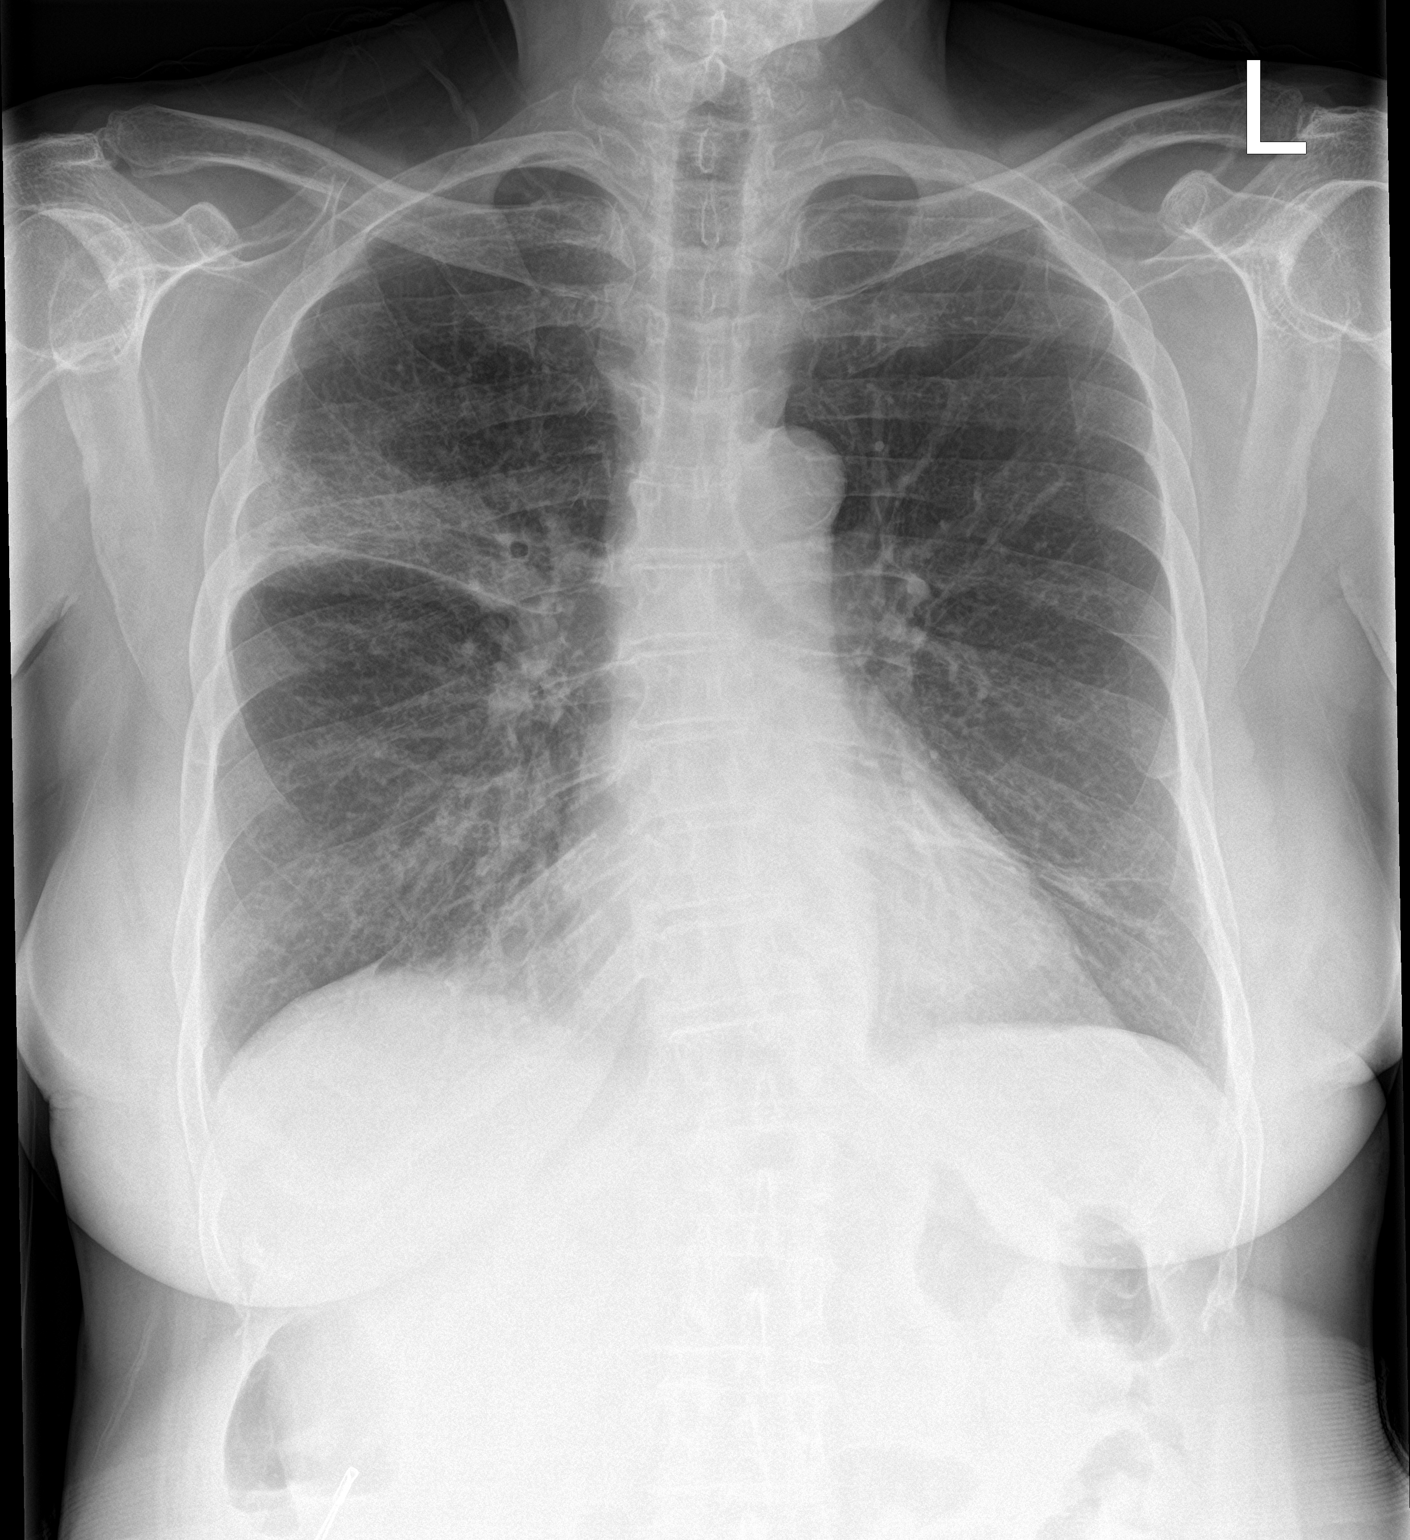

[chest lat]
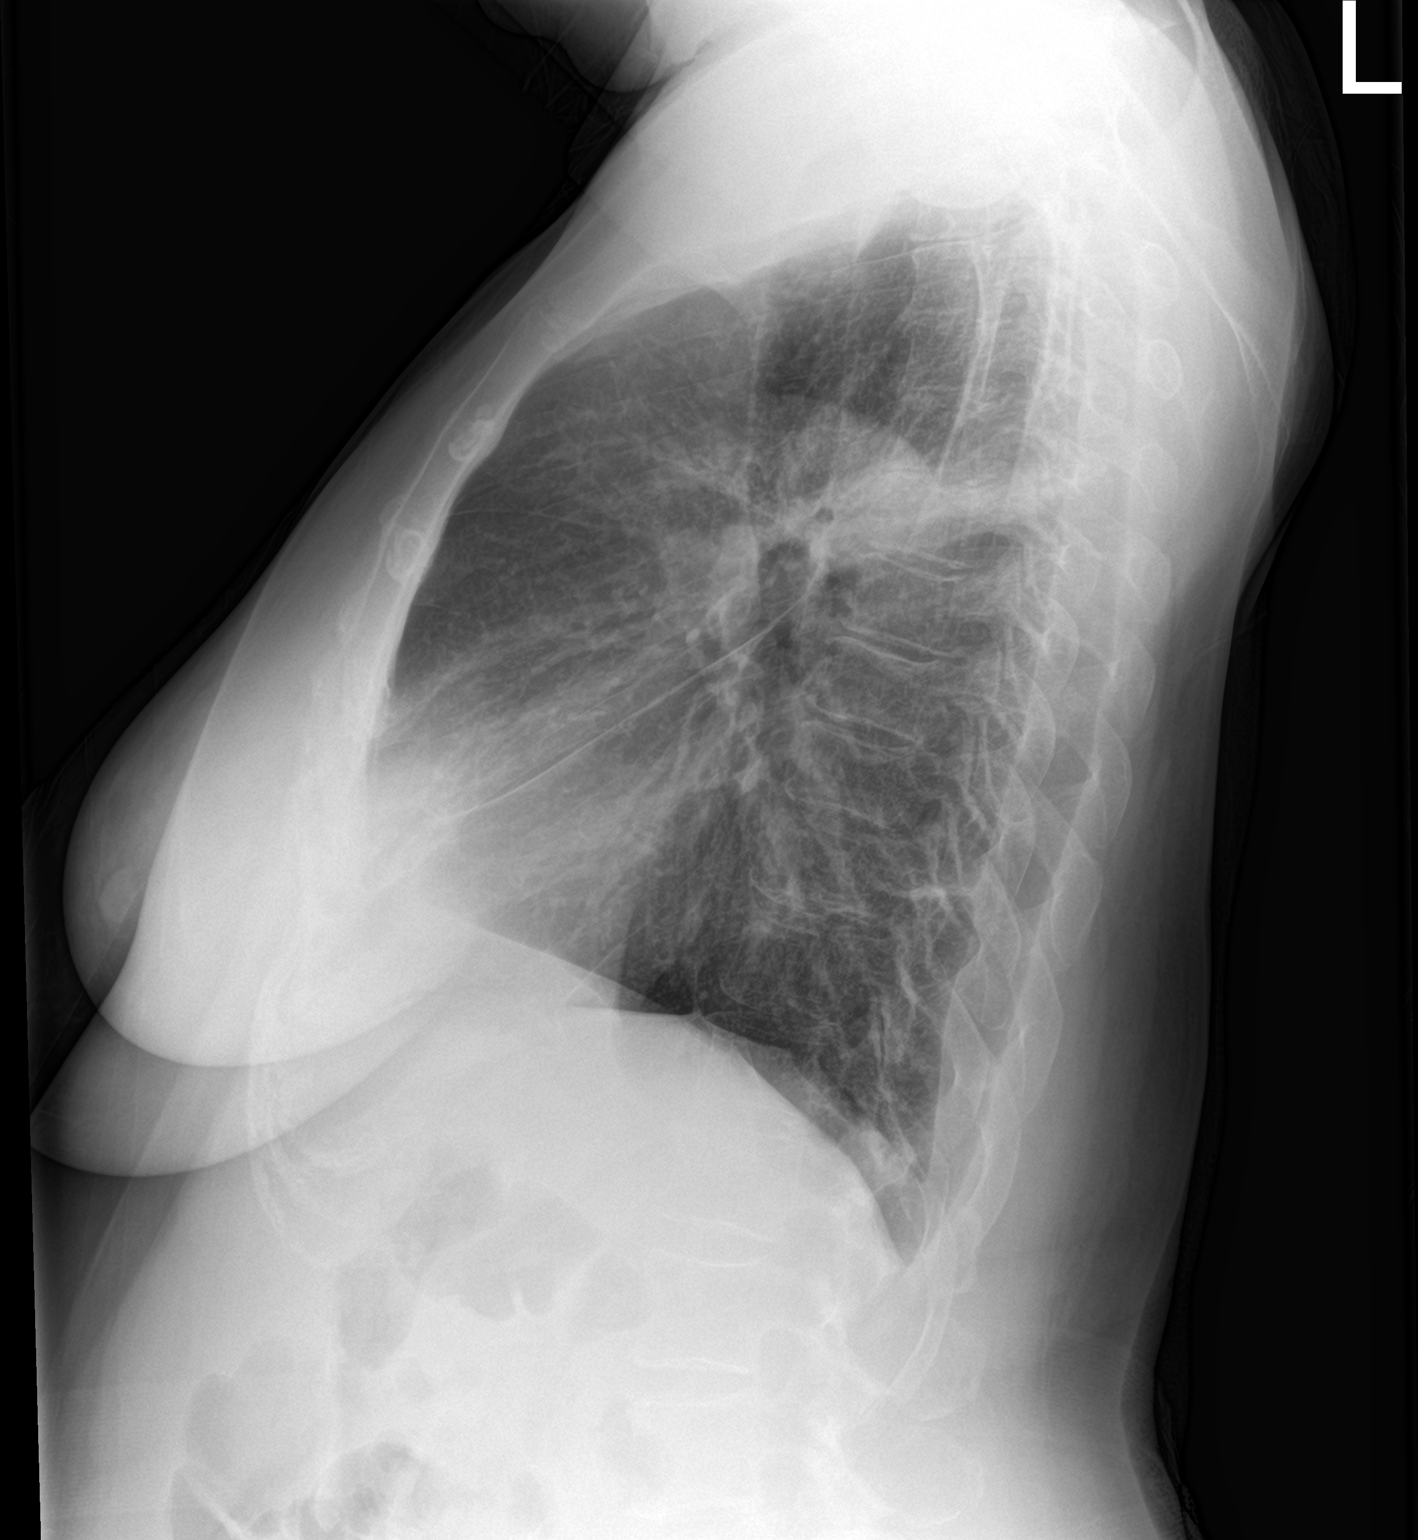

[2 of 2 positions shown; findings below may reference images not displayed]

FINDINGS: Consolidation in the inferior right upper lobe is improved. Hazy
airspace disease in the right middle lobe is worse. These findings
are best appreciated on lateral view imaging. Normal heart size. No
pneumothorax. No pleural effusion. Subsegmental atelectasis at the
left base.
IMPRESSION: Improved right upper lobe consolidation. Worsening right middle lobe
consolidation.

## 2018-04-30 ENCOUNTER — Ambulatory Visit: Payer: Medicaid Other

## 2018-05-06 ENCOUNTER — Ambulatory Visit: Payer: Medicaid Other

## 2018-05-26 ENCOUNTER — Other Ambulatory Visit: Payer: Self-pay | Admitting: Family Medicine

## 2018-05-26 DIAGNOSIS — G4709 Other insomnia: Secondary | ICD-10-CM

## 2018-05-26 DIAGNOSIS — E7800 Pure hypercholesterolemia, unspecified: Secondary | ICD-10-CM

## 2018-05-26 DIAGNOSIS — J4520 Mild intermittent asthma, uncomplicated: Secondary | ICD-10-CM

## 2018-06-02 ENCOUNTER — Other Ambulatory Visit: Payer: Self-pay | Admitting: Family Medicine

## 2018-06-02 DIAGNOSIS — G4709 Other insomnia: Secondary | ICD-10-CM

## 2018-06-03 ENCOUNTER — Other Ambulatory Visit: Payer: Self-pay

## 2018-06-29 ENCOUNTER — Other Ambulatory Visit: Payer: Self-pay | Admitting: Family Medicine

## 2018-06-29 ENCOUNTER — Other Ambulatory Visit: Payer: Self-pay | Admitting: Internal Medicine

## 2018-06-29 DIAGNOSIS — M1710 Unilateral primary osteoarthritis, unspecified knee: Secondary | ICD-10-CM

## 2018-06-29 DIAGNOSIS — G4709 Other insomnia: Secondary | ICD-10-CM

## 2018-06-29 DIAGNOSIS — E7800 Pure hypercholesterolemia, unspecified: Secondary | ICD-10-CM

## 2018-06-29 DIAGNOSIS — J4520 Mild intermittent asthma, uncomplicated: Secondary | ICD-10-CM

## 2018-07-01 ENCOUNTER — Other Ambulatory Visit: Payer: Self-pay | Admitting: Family Medicine

## 2018-07-01 DIAGNOSIS — G4709 Other insomnia: Secondary | ICD-10-CM

## 2018-07-28 ENCOUNTER — Other Ambulatory Visit: Payer: Self-pay | Admitting: Family Medicine

## 2018-07-28 ENCOUNTER — Other Ambulatory Visit: Payer: Self-pay | Admitting: Internal Medicine

## 2018-07-28 DIAGNOSIS — K219 Gastro-esophageal reflux disease without esophagitis: Secondary | ICD-10-CM

## 2018-07-28 DIAGNOSIS — M1710 Unilateral primary osteoarthritis, unspecified knee: Secondary | ICD-10-CM

## 2018-07-28 DIAGNOSIS — J4520 Mild intermittent asthma, uncomplicated: Secondary | ICD-10-CM

## 2018-07-29 ENCOUNTER — Other Ambulatory Visit: Payer: Self-pay | Admitting: Family Medicine

## 2018-07-29 DIAGNOSIS — K219 Gastro-esophageal reflux disease without esophagitis: Secondary | ICD-10-CM

## 2018-07-29 DIAGNOSIS — J4520 Mild intermittent asthma, uncomplicated: Secondary | ICD-10-CM

## 2018-08-03 ENCOUNTER — Other Ambulatory Visit: Payer: Self-pay | Admitting: Family Medicine

## 2018-08-03 ENCOUNTER — Other Ambulatory Visit: Payer: Self-pay | Admitting: Internal Medicine

## 2018-08-03 DIAGNOSIS — G4709 Other insomnia: Secondary | ICD-10-CM

## 2018-08-03 DIAGNOSIS — M1710 Unilateral primary osteoarthritis, unspecified knee: Secondary | ICD-10-CM

## 2018-08-03 DIAGNOSIS — K219 Gastro-esophageal reflux disease without esophagitis: Secondary | ICD-10-CM

## 2018-08-03 DIAGNOSIS — E7800 Pure hypercholesterolemia, unspecified: Secondary | ICD-10-CM

## 2018-08-03 DIAGNOSIS — I10 Essential (primary) hypertension: Secondary | ICD-10-CM

## 2018-08-03 DIAGNOSIS — J4520 Mild intermittent asthma, uncomplicated: Secondary | ICD-10-CM

## 2018-08-06 ENCOUNTER — Other Ambulatory Visit: Payer: Self-pay | Admitting: Family Medicine

## 2018-08-06 DIAGNOSIS — J4520 Mild intermittent asthma, uncomplicated: Secondary | ICD-10-CM

## 2018-08-11 ENCOUNTER — Other Ambulatory Visit: Payer: Self-pay | Admitting: Internal Medicine

## 2018-08-11 DIAGNOSIS — M1710 Unilateral primary osteoarthritis, unspecified knee: Secondary | ICD-10-CM

## 2018-08-18 ENCOUNTER — Other Ambulatory Visit: Payer: Self-pay | Admitting: Family Medicine

## 2018-09-07 ENCOUNTER — Other Ambulatory Visit: Payer: Self-pay | Admitting: Family Medicine

## 2018-09-07 DIAGNOSIS — J4520 Mild intermittent asthma, uncomplicated: Secondary | ICD-10-CM

## 2018-09-09 ENCOUNTER — Other Ambulatory Visit: Payer: Self-pay | Admitting: Family Medicine

## 2018-09-09 DIAGNOSIS — J4520 Mild intermittent asthma, uncomplicated: Secondary | ICD-10-CM

## 2018-10-19 ENCOUNTER — Ambulatory Visit: Payer: Medicaid Other | Attending: Family Medicine | Admitting: Family Medicine

## 2018-10-19 ENCOUNTER — Encounter: Payer: Self-pay | Admitting: Family Medicine

## 2018-10-19 DIAGNOSIS — J4520 Mild intermittent asthma, uncomplicated: Secondary | ICD-10-CM | POA: Diagnosis not present

## 2018-10-19 DIAGNOSIS — J322 Chronic ethmoidal sinusitis: Secondary | ICD-10-CM

## 2018-10-19 DIAGNOSIS — M170 Bilateral primary osteoarthritis of knee: Secondary | ICD-10-CM | POA: Insufficient documentation

## 2018-10-19 DIAGNOSIS — E7800 Pure hypercholesterolemia, unspecified: Secondary | ICD-10-CM | POA: Diagnosis not present

## 2018-10-19 DIAGNOSIS — Z7952 Long term (current) use of systemic steroids: Secondary | ICD-10-CM | POA: Insufficient documentation

## 2018-10-19 DIAGNOSIS — Z79899 Other long term (current) drug therapy: Secondary | ICD-10-CM | POA: Diagnosis not present

## 2018-10-19 DIAGNOSIS — Z833 Family history of diabetes mellitus: Secondary | ICD-10-CM | POA: Insufficient documentation

## 2018-10-19 DIAGNOSIS — I10 Essential (primary) hypertension: Secondary | ICD-10-CM

## 2018-10-19 DIAGNOSIS — Z791 Long term (current) use of non-steroidal anti-inflammatories (NSAID): Secondary | ICD-10-CM | POA: Insufficient documentation

## 2018-10-19 DIAGNOSIS — Z7951 Long term (current) use of inhaled steroids: Secondary | ICD-10-CM | POA: Insufficient documentation

## 2018-10-19 DIAGNOSIS — Z96652 Presence of left artificial knee joint: Secondary | ICD-10-CM | POA: Diagnosis not present

## 2018-10-19 DIAGNOSIS — K219 Gastro-esophageal reflux disease without esophagitis: Secondary | ICD-10-CM | POA: Diagnosis not present

## 2018-10-19 DIAGNOSIS — E785 Hyperlipidemia, unspecified: Secondary | ICD-10-CM | POA: Insufficient documentation

## 2018-10-19 MED ORDER — ATORVASTATIN CALCIUM 10 MG PO TABS: 10.0000 mg | ORAL_TABLET | Freq: Every day | ORAL | 6 refills | Status: DC

## 2018-10-19 MED ORDER — FLUTICASONE PROPIONATE 50 MCG/ACT NA SUSP: 2.0000 | Freq: Every day | NASAL | 6 refills | Status: DC

## 2018-10-19 MED ORDER — MONTELUKAST SODIUM 10 MG PO TABS: 10.0000 mg | ORAL_TABLET | Freq: Every day | ORAL | 6 refills | Status: DC

## 2018-10-19 MED ORDER — GABAPENTIN 300 MG PO CAPS: ORAL_CAPSULE | ORAL | 6 refills | Status: DC

## 2018-10-19 MED ORDER — ALBUTEROL SULFATE (2.5 MG/3ML) 0.083% IN NEBU: 2.5000 mg | INHALATION_SOLUTION | Freq: Four times a day (QID) | RESPIRATORY_TRACT | 3 refills | Status: DC | PRN

## 2018-10-19 MED ORDER — FLUTICASONE-SALMETEROL 500-50 MCG/DOSE IN AEPB: INHALATION_SPRAY | RESPIRATORY_TRACT | 6 refills | Status: DC

## 2018-10-19 MED ORDER — LOSARTAN POTASSIUM 100 MG PO TABS: 100.0000 mg | ORAL_TABLET | Freq: Every day | ORAL | 6 refills | Status: DC

## 2018-10-19 MED ORDER — PANTOPRAZOLE SODIUM 20 MG PO TBEC: 20.0000 mg | DELAYED_RELEASE_TABLET | Freq: Every day | ORAL | 5 refills | Status: DC

## 2018-10-19 MED ORDER — PREDNISONE 20 MG PO TABS: 20.0000 mg | ORAL_TABLET | Freq: Two times a day (BID) | ORAL | 0 refills | Status: DC

## 2018-10-19 MED ORDER — AMLODIPINE BESYLATE 10 MG PO TABS: 10.0000 mg | ORAL_TABLET | Freq: Every day | ORAL | 6 refills | Status: DC

## 2018-10-19 NOTE — Patient Instructions (Signed)
Asthma, Adult    Asthma is a long-term (chronic) condition in which the airways get tight and narrow. The airways are the breathing passages that lead from the nose and mouth down into the lungs. A person with asthma will have times when symptoms get worse. These are called asthma attacks. They can cause coughing, whistling sounds when you breathe (wheezing), shortness of breath, and chest pain. They can make it hard to breathe. There is no cure for asthma, but medicines and lifestyle changes can help control it.  There are many things that can bring on an asthma attack or make asthma symptoms worse (triggers). Common triggers include:  · Mold.  · Dust.  · Cigarette smoke.  · Cockroaches.  · Things that can cause allergy symptoms (allergens). These include animal skin flakes (dander) and pollen from trees or grass.  · Things that pollute the air. These may include household cleaners, wood smoke, smog, or chemical odors.  · Cold air, weather changes, and wind.  · Crying or laughing hard.  · Stress.  · Certain medicines or drugs.  · Certain foods such as dried fruit, potato chips, and grape juice.  · Infections, such as a cold or the flu.  · Certain medical conditions or diseases.  · Exercise or tiring activities.  Asthma may be treated with medicines and by staying away from the things that cause asthma attacks. Types of medicines may include:  · Controller medicines. These help prevent asthma symptoms. They are usually taken every day.  · Fast-acting reliever or rescue medicines. These quickly relieve asthma symptoms. They are used as needed and provide short-term relief.  · Allergy medicines if your attacks are brought on by allergens.  · Medicines to help control the body's defense (immune) system.  Follow these instructions at home:  Avoiding triggers in your home  · Change your heating and air conditioning filter often.  · Limit your use of fireplaces and wood stoves.  · Get rid of pests (such as roaches and  mice) and their droppings.  · Throw away plants if you see mold on them.  · Clean your floors. Dust regularly. Use cleaning products that do not smell.  · Have someone vacuum when you are not home. Use a vacuum cleaner with a HEPA filter if possible.  · Replace carpet with wood, tile, or vinyl flooring. Carpet can trap animal skin flakes and dust.  · Use allergy-proof pillows, mattress covers, and box spring covers.  · Wash bed sheets and blankets every week in hot water. Dry them in a dryer.  · Keep your bedroom free of any triggers.   · Avoid pets and keep windows closed when things that cause allergy symptoms are in the air.  · Use blankets that are made of polyester or cotton.  · Clean bathrooms and kitchens with bleach. If possible, have someone repaint the walls in these rooms with mold-resistant paint. Keep out of the rooms that are being cleaned and painted.  · Wash your hands often with soap and water. If soap and water are not available, use hand sanitizer.  · Do not allow anyone to smoke in your home.  General instructions  · Take over-the-counter and prescription medicines only as told by your doctor.  ? Talk with your doctor if you have questions about how or when to take your medicines.  ? Make note if you need to use your medicines more often than usual.  · Do not use any products that   contain nicotine or tobacco, such as cigarettes and e-cigarettes. If you need help quitting, ask your doctor.  · Stay away from secondhand smoke.  · Avoid doing things outdoors when allergen counts are high and when air quality is low.  · Wear a ski mask when doing outdoor activities in the winter. The mask should cover your nose and mouth. Exercise indoors on cold days if you can.  · Warm up before you exercise. Take time to cool down after exercise.  · Use a peak flow meter as told by your doctor. A peak flow meter is a tool that measures how well the lungs are working.  · Keep track of the peak flow meter's readings.  Write them down.  · Follow your asthma action plan. This is a written plan for taking care of your asthma and treating your attacks.  · Make sure you get all the shots (vaccines) that your doctor recommends. Ask your doctor about a flu shot and a pneumonia shot.  · Keep all follow-up visits as told by your doctor. This is important.  Contact a doctor if:  · You have wheezing, shortness of breath, or a cough even while taking medicine to prevent attacks.  · The mucus you cough up (sputum) is thicker than usual.  · The mucus you cough up changes from clear or white to yellow, green, gray, or bloody.  · You have problems from the medicine you are taking, such as:  ? A rash.  ? Itching.  ? Swelling.  ? Trouble breathing.  · You need reliever medicines more than 2-3 times a week.  · Your peak flow reading is still at 50-79% of your personal best after following the action plan for 1 hour.  · You have a fever.  Get help right away if:  · You seem to be worse and are not responding to medicine during an asthma attack.  · You are short of breath even at rest.  · You get short of breath when doing very little activity.  · You have trouble eating, drinking, or talking.  · You have chest pain or tightness.  · You have a fast heartbeat.  · Your lips or fingernails start to turn blue.  · You are light-headed or dizzy, or you faint.  · Your peak flow is less than 50% of your personal best.  · You feel too tired to breathe normally.  Summary  · Asthma is a long-term (chronic) condition in which the airways get tight and narrow. An asthma attack can make it hard to breathe.  · Asthma cannot be cured, but medicines and lifestyle changes can help control it.  · Make sure you understand how to avoid triggers and how and when to use your medicines.  This information is not intended to replace advice given to you by your health care provider. Make sure you discuss any questions you have with your health care provider.  Document  Released: 02/05/2008 Document Revised: 09/23/2016 Document Reviewed: 09/23/2016  [employer]vier Interactive Patient Education © 2019 [employer]

## 2018-10-19 NOTE — Progress Notes (Signed)
Subjective:  Patient ID: Angel Hebert, female    DOB: 09/06/1955  Age: 63 y.o. MRN: [id number]  CC: Hypertension and Asthma   HPI Angel Hebert is a 63 -year-old female with a history of hypertension, hyperlipidemia, asthma, allergic rhinitis, nasal polyps (Status post sinus surgery in 10/2016), GERD, bilateral knee osteoarthritis (status post left total knee arthroplasty in 09/2016) who comes in for a follow-up visit. She complains of wheezing, cough which is dry and symptoms have been worse at night for the last 2 weeks.  She has run out of her nebulizer solution but has been compliant with her inhalers. Does not smoke.  Denies chest pains, fever, upper respiratory symptoms. Her blood pressure is elevated and she endorses compliance with her antihypertensive. Also taking her statin with no complaints of myalgias. Her last office visit was 1 year ago.  Past Medical History:  Diagnosis Date  . Arthritis   . Asthma    last year in December  . Dyspnea   . GERD (gastroesophageal reflux disease)   . High cholesterol   . Hypertension   . Pneumonia 01/2017    Past Surgical History:  Procedure Laterality Date  . ABDOMINAL HYSTERECTOMY    . nasal polyps     removed just this past tuesday at [place] facility over by [facility]  . TOTAL KNEE ARTHROPLASTY Left 10/01/2016   Procedure: LEFT TOTAL KNEE ARTHROPLASTY;  Surgeon: Meredith Pel, MD;  Location: [place];  Service: Orthopedics;  Laterality: Left;    Family History  Problem Relation Age of Onset  . Diabetes Sister   . Colon cancer Neg Hx   . Breast cancer Neg Hx     Allergies  Allergen Reactions  . No Known Allergies     Outpatient Medications Prior to Visit  Medication Sig Dispense Refill  . ALL DAY ALLERGY 10 MG tablet TAKE 1 TABLET BY MOUTH DAILY. 30 tablet 2  . ferrous sulfate 325 (65 FE) MG tablet Take 1 tablet (325 mg total) 2 (two) times daily with a meal by mouth. 60 tablet 3  . naproxen (NAPROSYN)  500 MG tablet Take 1 tablet (500 mg total) by mouth 2 (two) times daily. MUST MAKE APPT FOR FURTHER REFILLS 30 tablet 0  . PROAIR HFA 108 (90 Base) MCG/ACT inhaler INHALE 2 PUFFS EVERY 6 (SIX) HOURS AS NEEDED INTO THE LUNGS FOR WHEEZING OR SHORTNESS OF BREATH. 8.5 g 5  . traZODone (DESYREL) 50 MG tablet Take 1 tablet (50 mg total) by mouth at bedtime. MUST MAKE APPT FOR FURTHER REFILLS 30 tablet 0  . ADVAIR DISKUS 500-50 MCG/DOSE AEPB INHALE 1 PUFF INTO THE LUNGS EVERY 12 (TWELVE) HOURS. MUST MAKE APPT FOR FURTHER REFILLS 60 each 0  . albuterol (PROVENTIL) (2.5 MG/3ML) 0.083% nebulizer solution Take 3 mLs (2.5 mg total) every 6 (six) hours as needed by nebulization for wheezing or shortness of breath. 75 mL 3  . amLODipine (NORVASC) 10 MG tablet Take 1 tablet (10 mg total) daily by mouth. 30 tablet 5  . atorvastatin (LIPITOR) 10 MG tablet TAKE 1 TABLET BY MOUTH DAILY AT 6 PM FOR CHOLESTEROL MUST MAKE APPT FOR FURTHER REFILLS 30 tablet 0  . fluticasone (FLONASE) 50 MCG/ACT nasal spray Place 2 sprays into both nostrils daily. 16 g 6  . gabapentin (NEURONTIN) 300 MG capsule TAKE 1 CAPSULE BY MOUTH 2 TIMES DAILY. 60 capsule 1  . gabapentin (NEURONTIN) 300 MG capsule TAKE 1 CAPSULE BY MOUTH 2 TIMES DAILY. 60 capsule 3  .  losartan (COZAAR) 100 MG tablet Take 1 tablet (100 mg total) by mouth daily. MUST MAKE APPT FOR FURTHER REFILLS 30 tablet 0  . montelukast (SINGULAIR) 10 MG tablet Take 1 tablet (10 mg total) by mouth at bedtime. MUST MAKE APPT FOR FURTHER REFILLS 30 tablet 0  . pantoprazole (PROTONIX) 20 MG tablet Take 1 tablet (20 mg total) daily by mouth. 30 tablet 5  . aspirin EC 325 MG tablet Begin taking aspirin on 10/27/2016 (Patient not taking: Reported on 04/09/2017) 30 tablet 0  . dextromethorphan-guaiFENesin (TUSSIN DM) 10-100 MG/5ML liquid Take 10 mLs by mouth every 4 (four) hours as needed for cough. (Patient not taking: Reported on 04/09/2017) 240 mL 0  . traMADol (ULTRAM) 50 MG tablet 1 PO Q D  PRN PAIN (Patient not taking: Reported on 10/08/2017) 30 tablet 0  . traMADol (ULTRAM) 50 MG tablet Take 1 tablet (50 mg total) by mouth every 12 (twelve) hours as needed. (Patient not taking: Reported on 10/19/2018) 60 tablet 0  . traMADol (ULTRAM) 50 MG tablet TAKE 1 TABLET BY MOUTH TWICE A DAY AS NEEDED FOR PAIN (Patient not taking: Reported on 10/19/2018) 60 tablet 0  . traMADol (ULTRAM) 50 MG tablet TAKE 1 TABLET BY MOUTH TWICE A DAY AS NEEDED FOR PAIN (Patient not taking: Reported on 10/19/2018) 60 tablet 0   No facility-administered medications prior to visit.      ROS Review of Systems General: negative for fever, weight loss, appetite change Eyes: no visual symptoms. ENT: no ear symptoms, no sinus tenderness, no nasal congestion or sore throat. Neck: no pain  Respiratory: +wheezing, no shortness of breath, +cough Cardiovascular: no chest pain, no dyspnea on exertion, no pedal edema, no orthopnea. Gastrointestinal: no abdominal pain, no diarrhea, no constipation Genito-Urinary: no urinary frequency, no dysuria, no polyuria. Hematologic: no bruising Endocrine: no cold or heat intolerance Neurological: no headaches, no seizures, no tremors Musculoskeletal: no joint pains, no joint swelling Skin: no pruritus, no rash. Psychological: no depression, no anxiety,    Objective:  BP (!) 152/82   Pulse 93   Temp 98.2 F (36.8 C) (Oral)   Wt 146 lb 9.6 oz (66.5 kg)   SpO2 97%   BMI 22.96 kg/m   BP/Weight 10/19/2018 10/08/2017 69/45/0388  Systolic BP 828 003 491  Diastolic BP 82 99 84  Wt. (Lbs) 146.6 139.4 145.6  BMI 22.96 21.83 22.8      Physical Exam Constitutional: normal appearing,  Eyes: PERRLA HEENT: Head is atraumatic, normal sinuses, normal oropharynx, normal appearing tonsils and palate, tympanic membrane is normal bilaterally. Neck: normal range of motion, no thyromegaly, no JVD Cardiovascular: normal rate and rhythm, normal heart sounds, no murmurs, rub or gallop,  no pedal edema Respiratory: Normal breath sounds, clear to auscultation bilaterally, no wheezes, no rales, no rhonchi Abdomen: soft, not tender to palpation, normal bowel sounds, no enlarged organs Musculoskeletal: Full ROM, no tenderness in joints Skin: warm and dry, no lesions. Neurological: alert, oriented x3, cranial nerves I-XII grossly intact , normal motor strength, normal sensation. Psychological: normal mood.   CMP Latest Ref Rng & Units 10/29/2017 03/03/2017 02/05/2017  Glucose 65 - 99 mg/dL 77 79 98  BUN 8 - 27 mg/dL '14 12 7  ' Creatinine 0.57 - 1.00 mg/dL 0.67 0.64 0.65  Sodium 134 - 144 mmol/L 143 139 132(L)  Potassium 3.5 - 5.2 mmol/L 4.3 3.7 3.3(L)  Chloride 96 - 106 mmol/L 102 101 100(L)  CO2 20 - 29 mmol/L 23 22 19(L)  Calcium  8.7 - 10.3 mg/dL 9.4 9.6 8.8(L)  Total Protein 6.0 - 8.5 g/dL 7.1 7.5 -  Total Bilirubin 0.0 - 1.2 mg/dL 0.4 <0.2 -  Alkaline Phos 39 - 117 IU/L 125(H) 173(H) -  AST 0 - 40 IU/L 34 13 -  ALT 0 - 32 IU/L 14 5 -    Lipid Panel     Component Value Date/Time   CHOL 189 10/29/2017 1007   TRIG 289 (H) 10/29/2017 1007   HDL 110 10/29/2017 1007   CHOLHDL 1.7 10/29/2017 1007   CHOLHDL 2.5 05/20/2016 1452   VLDL 26 05/20/2016 1452   LDLCALC 21 10/29/2017 1007    CBC    Component Value Date/Time   WBC 6.6 10/29/2017 1007   WBC 22.4 (H) 02/05/2017 0539   RBC 3.77 10/29/2017 1007   RBC 3.21 (L) 02/05/2017 0539   HGB 12.5 10/29/2017 1007   HCT 37.6 10/29/2017 1007   PLT 415 (H) 10/29/2017 1007   MCV 100 (H) 10/29/2017 1007   MCH 33.2 (H) 10/29/2017 1007   MCH 26.2 02/05/2017 0539   MCHC 33.2 10/29/2017 1007   MCHC 31.7 02/05/2017 0539   RDW 16.4 (H) 10/29/2017 1007   LYMPHSABS 1.7 10/29/2017 1007   MONOABS 1.1 (H) 02/04/2017 1030   EOSABS 0.4 10/29/2017 1007   BASOSABS 0.1 10/29/2017 1007    Lab Results  Component Value Date   HGBA1C 5.3 10/26/2015    Assessment & Plan:   1. Gastroesophageal reflux disease, esophagitis presence not  specified Stable - pantoprazole (PROTONIX) 20 MG tablet; Take 1 tablet (20 mg total) by mouth daily.  Dispense: 30 tablet; Refill: 5  2. Mild intermittent asthma without complication Acute exacerbation as per patient symptoms but not evident on exam Short course of prednisone - montelukast (SINGULAIR) 10 MG tablet; Take 1 tablet (10 mg total) by mouth at bedtime.  Dispense: 30 tablet; Refill: 6 - Fluticasone-Salmeterol (ADVAIR DISKUS) 500-50 MCG/DOSE AEPB; INHALE 1 PUFF INTO THE LUNGS EVERY 12 (TWELVE) HOURS.  Dispense: 60 each; Refill: 6 - albuterol (PROVENTIL) (2.5 MG/3ML) 0.083% nebulizer solution; Take 3 mLs (2.5 mg total) by nebulization every 6 (six) hours as needed for wheezing or shortness of breath.  Dispense: 75 mL; Refill: 3 - predniSONE (DELTASONE) 20 MG tablet; Take 1 tablet (20 mg total) by mouth 2 (two) times daily with a meal.  Dispense: 10 tablet; Refill: 0  3. Chronic ethmoidal sinusitis Stable - fluticasone (FLONASE) 50 MCG/ACT nasal spray; Place 2 sprays into both nostrils daily.  Dispense: 16 g; Refill: 6  4. Pure hypercholesterolemia Controlled Low-cholesterol diet - atorvastatin (LIPITOR) 10 MG tablet; Take 1 tablet (10 mg total) by mouth daily at 6 PM.  Dispense: 30 tablet; Refill: 6  5. Essential hypertension Uncontrolled Lifestyle modifications and will adjust regimen accordingly at next visit if indicated Counseled on blood pressure goal of less than 130/80, low-sodium, DASH diet, medication compliance, 150 minutes of moderate intensity exercise per week. Discussed medication compliance, adverse effects. - CMP14+EGFR; Future - Lipid panel; Future - amLODipine (NORVASC) 10 MG tablet; Take 1 tablet (10 mg total) by mouth daily.  Dispense: 30 tablet; Refill: 6 - losartan (COZAAR) 100 MG tablet; Take 1 tablet (100 mg total) by mouth daily.  Dispense: 30 tablet; Refill: 6   Meds ordered this encounter  Medications  . pantoprazole (PROTONIX) 20 MG tablet     Sig: Take 1 tablet (20 mg total) by mouth daily.    Dispense:  30 tablet    Refill:  5  . montelukast (SINGULAIR) 10 MG tablet    Sig: Take 1 tablet (10 mg total) by mouth at bedtime.    Dispense:  30 tablet    Refill:  6  . DISCONTD: losartan (COZAAR) 100 MG tablet    Sig: Take 1 tablet (100 mg total) by mouth daily.    Dispense:  30 tablet    Refill:  6  . gabapentin (NEURONTIN) 300 MG capsule    Sig: TAKE 1 CAPSULE BY MOUTH 2 TIMES DAILY.    Dispense:  60 capsule    Refill:  6  . fluticasone (FLONASE) 50 MCG/ACT nasal spray    Sig: Place 2 sprays into both nostrils daily.    Dispense:  16 g    Refill:  6  . atorvastatin (LIPITOR) 10 MG tablet    Sig: Take 1 tablet (10 mg total) by mouth daily at 6 PM.    Dispense:  30 tablet    Refill:  6  . amLODipine (NORVASC) 10 MG tablet    Sig: Take 1 tablet (10 mg total) by mouth daily.    Dispense:  30 tablet    Refill:  6  . Fluticasone-Salmeterol (ADVAIR DISKUS) 500-50 MCG/DOSE AEPB    Sig: INHALE 1 PUFF INTO THE LUNGS EVERY 12 (TWELVE) HOURS.    Dispense:  60 each    Refill:  6  . albuterol (PROVENTIL) (2.5 MG/3ML) 0.083% nebulizer solution    Sig: Take 3 mLs (2.5 mg total) by nebulization every 6 (six) hours as needed for wheezing or shortness of breath.    Dispense:  75 mL    Refill:  3  . predniSONE (DELTASONE) 20 MG tablet    Sig: Take 1 tablet (20 mg total) by mouth 2 (two) times daily with a meal.    Dispense:  10 tablet    Refill:  0  . losartan (COZAAR) 100 MG tablet    Sig: Take 1 tablet (100 mg total) by mouth daily.    Dispense:  30 tablet    Refill:  6    Follow-up: Return in about 3 months (around 01/17/2019) for Follow-up of chronic medical conditions.       Charlott Rakes, MD, FAAFP. [facility] and [facility], [place]   10/19/2018, 2:36 PM

## 2018-10-22 ENCOUNTER — Ambulatory Visit: Payer: Medicaid Other | Attending: Family Medicine

## 2018-10-22 DIAGNOSIS — I10 Essential (primary) hypertension: Secondary | ICD-10-CM | POA: Diagnosis not present

## 2018-10-23 LAB — CMP14+EGFR
ALT: 8 IU/L (ref 0–32)
AST: 18 IU/L (ref 0–40)
Albumin/Globulin Ratio: 1.2 (ref 1.2–2.2)
Albumin: 4.2 g/dL (ref 3.8–4.8)
Alkaline Phosphatase: 74 IU/L (ref 39–117)
BUN/Creatinine Ratio: 8 — ABNORMAL LOW (ref 12–28)
BUN: 6 mg/dL — AB (ref 8–27)
Bilirubin Total: <0.2 mg/dL (ref 0.0–1.2)
CO2: 25 mmol/L (ref 20–29)
Calcium: 9.9 mg/dL (ref 8.7–10.3)
Chloride: 104 mmol/L (ref 96–106)
Creatinine, Ser: 0.72 mg/dL (ref 0.57–1.00)
GFR calc non Af Amer: 91 mL/min/{1.73_m2} (ref >59)
GFR, EST AFRICAN AMERICAN: 105 mL/min/{1.73_m2} (ref >59)
Globulin, Total: 3.5 g/dL (ref 1.5–4.5)
Glucose: 74 mg/dL (ref 65–99)
Potassium: 4.8 mmol/L (ref 3.5–5.2)
Sodium: 144 mmol/L (ref 134–144)
Total Protein: 7.7 g/dL (ref 6.0–8.5)

## 2018-10-23 LAB — LIPID PANEL
Chol/HDL Ratio: 2.3 ratio (ref 0.0–4.4)
Cholesterol, Total: 185 mg/dL (ref 100–199)
HDL: 81 mg/dL (ref >39)
LDL Calculated: 78 mg/dL (ref 0–99)
Triglycerides: 129 mg/dL (ref 0–149)
VLDL Cholesterol Cal: 26 mg/dL (ref 5–40)

## 2018-10-26 ENCOUNTER — Ambulatory Visit: Payer: Medicaid Other | Admitting: Family Medicine

## 2018-10-26 ENCOUNTER — Other Ambulatory Visit: Payer: Self-pay | Admitting: Family Medicine

## 2018-10-26 ENCOUNTER — Other Ambulatory Visit: Payer: Self-pay | Admitting: Internal Medicine

## 2018-10-26 DIAGNOSIS — M1710 Unilateral primary osteoarthritis, unspecified knee: Secondary | ICD-10-CM

## 2018-10-26 DIAGNOSIS — G4709 Other insomnia: Secondary | ICD-10-CM

## 2018-11-05 ENCOUNTER — Telehealth: Payer: Self-pay

## 2018-11-05 NOTE — Telephone Encounter (Signed)
Patient was called and informed of lab results. 

## 2018-11-05 NOTE — Telephone Encounter (Signed)
-----   Message from Charlott Rakes, MD sent at 10/23/2018  3:02 PM EST ----- Please inform the patient that labs are normal. Thank you.

## 2018-11-13 ENCOUNTER — Telehealth: Payer: Self-pay | Admitting: Family Medicine

## 2018-11-13 ENCOUNTER — Other Ambulatory Visit: Payer: Self-pay | Admitting: Family Medicine

## 2018-11-13 DIAGNOSIS — Z1231 Encounter for screening mammogram for malignant neoplasm of breast: Secondary | ICD-10-CM

## 2018-11-27 ENCOUNTER — Other Ambulatory Visit: Payer: Self-pay | Admitting: Family Medicine

## 2018-11-27 DIAGNOSIS — I10 Essential (primary) hypertension: Secondary | ICD-10-CM

## 2018-12-01 ENCOUNTER — Other Ambulatory Visit: Payer: Self-pay

## 2018-12-01 ENCOUNTER — Ambulatory Visit (HOSPITAL_COMMUNITY)
Admission: EM | Admit: 2018-12-01 | Discharge: 2018-12-01 | Disposition: A | Discharge status: Home / Self Care | Payer: Medicaid Other | Attending: Family Medicine | Admitting: Family Medicine

## 2018-12-01 ENCOUNTER — Encounter (HOSPITAL_COMMUNITY): Payer: Self-pay | Admitting: Emergency Medicine

## 2018-12-01 DIAGNOSIS — R609 Edema, unspecified: Secondary | ICD-10-CM

## 2018-12-01 NOTE — ED Triage Notes (Signed)
Pt c/o bilateral leg swelling, states yesterday her R leg was swollen and she woke up ttoday with her L leg swollen as well.

## 2018-12-01 NOTE — Discharge Instructions (Addendum)
I would like for you to wear the ace wraps and elevate the legs to decrease the swelling Compression stockings would work better if you can purchase those.  If your symptoms worsen or you develop chest pain, SOB please follow up.

## 2018-12-01 NOTE — ED Provider Notes (Signed)
[place]    CSN: [id number] Arrival date & time: 12/01/18  1032     History   Chief Complaint Chief Complaint  Patient presents with  . Leg Swelling    HPI Angel Hebert is a 63 y.o. female.   Patient is a 63 year old female with past medical history of arthritis, asthma, dyspnea, GERD, high cholesterol, hypertension.  She presents today with bilateral lower extremity swelling.  This is worse on the right.  Reports the swelling started on the right and now has moved to the left leg.  This is been constant and worsening over last 2 days.  She denies being on her feet a lot.  She reports she is been sitting in a chair a lot with her legs dangling.  Reports that she practices a low-sodium diet.  Denies any associated calf pain, erythema, chest pain, shortness of breath, recent traveling.  Denies any history of DVT or PE.  ROS per HPI      Past Medical History:  Diagnosis Date  . Arthritis   . Asthma    last year in December  . Dyspnea   . GERD (gastroesophageal reflux disease)   . High cholesterol   . Hypertension   . Pneumonia 01/2017    Patient Active Problem List   Diagnosis Date Noted  . Insomnia 03/03/2017  . 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 Presence of left artificial knee joint 10/14/2016  . Arthritis of knee 10/01/2016  . Unilateral primary osteoarthritis, left knee 12/13/2015  . Benign essential HTN   . Physical deconditioning   . Anemia, iron deficiency   . Pressure ulcer 10/14/2015  . CAP (community acquired pneumonia)   . Pleural effusion   . Sepsis ([place])   . Sepsis due to Streptococcus pneumoniae ([place])   . Bacteremia   . Community acquired pneumonia 10/08/2015  . Acute sinusitis 12/02/2014  . Sinusitis 03/17/2013  . Hyperlipidemia 03/02/2007  . Essential hypertension 03/02/2007  . Allergic rhinitis 03/02/2007  . Asthma 03/02/2007  . GERD 03/02/2007    Past Surgical History:  Procedure Laterality Date   . ABDOMINAL HYSTERECTOMY    . nasal polyps     removed just this past tuesday at [place] facility over by [facility]  . TOTAL KNEE ARTHROPLASTY Left 10/01/2016   Procedure: LEFT TOTAL KNEE ARTHROPLASTY;  Surgeon: Meredith Pel, MD;  Location: [place];  Service: Orthopedics;  Laterality: Left;    OB History   No obstetric history on file.      Home Medications    Prior to Admission medications   Medication Sig Start Date End Date Taking? Authorizing Provider  albuterol (PROVENTIL) (2.5 MG/3ML) 0.083% nebulizer solution Take 3 mLs (2.5 mg total) by nebulization every 6 (six) hours as needed for wheezing or shortness of breath. 10/19/18   Charlott Rakes, MD  ALL DAY ALLERGY 10 MG tablet TAKE 1 TABLET BY MOUTH DAILY. 01/28/17   Charlott Rakes, MD  amLODipine (NORVASC) 10 MG tablet Take 1 tablet (10 mg total) by mouth daily. 10/19/18   Charlott Rakes, MD  atorvastatin (LIPITOR) 10 MG tablet Take 1 tablet (10 mg total) by mouth daily at 6 PM. 10/19/18   Charlott Rakes, MD  ferrous sulfate 325 (65 FE) MG tablet Take 1 tablet (325 mg total) 2 (two) times daily with a meal by mouth. 07/15/17   Newlin, Enobong, MD  fluticasone (FLONASE) 50 MCG/ACT nasal spray Place 2 sprays into both nostrils daily. 10/19/18  Charlott Rakes, MD  Fluticasone-Salmeterol (ADVAIR DISKUS) 500-50 MCG/DOSE AEPB INHALE 1 PUFF INTO THE LUNGS EVERY 12 (TWELVE) HOURS. 10/19/18   Charlott Rakes, MD  gabapentin (NEURONTIN) 300 MG capsule TAKE 1 CAPSULE BY MOUTH 2 TIMES DAILY. 10/19/18   Charlott Rakes, MD  losartan (COZAAR) 25 MG tablet Take 4 tablets (100 mg total) by mouth daily. 11/27/18   Charlott Rakes, MD  montelukast (SINGULAIR) 10 MG tablet Take 1 tablet (10 mg total) by mouth at bedtime. 10/19/18   Charlott Rakes, MD  naproxen (NAPROSYN) 500 MG tablet TAKE 1 TABLET BY MOUTH 2 TIMES DAILY 10/26/18   Charlott Rakes, MD  pantoprazole (PROTONIX) 20 MG tablet Take 1 tablet (20 mg total) by mouth daily. 10/19/18    Charlott Rakes, MD  PROAIR HFA 108 (90 Base) MCG/ACT inhaler INHALE 2 PUFFS EVERY 6 (SIX) HOURS AS NEEDED INTO THE LUNGS FOR WHEEZING OR SHORTNESS OF BREATH. 08/07/18   Charlott Rakes, MD  traZODone (DESYREL) 50 MG tablet Take 1 tablet (50 mg total) by mouth at bedtime. 10/26/18   Charlott Rakes, MD    Family History Family History  Problem Relation Age of Onset  . Diabetes Sister   . Colon cancer Neg Hx   . Breast cancer Neg Hx     Social History Social History   Tobacco Use  . Smoking status: Never Smoker  . Smokeless tobacco: Never Used  Substance Use Topics  . Alcohol use: No    Comment: Quit years ago  . Drug use: No     Allergies   No known allergies   Review of Systems Review of Systems   Physical Exam Triage Vital Signs ED Triage Vitals  Enc Vitals Group     BP 12/01/18 1047 (!) 161/83     Pulse Rate 12/01/18 1047 94     Resp 12/01/18 1047 16     Temp 12/01/18 1047 (!) 97.4 F (36.3 C)     Temp src --      SpO2 12/01/18 1047 96 %     Weight --      Height --      Head Circumference --      Peak Flow --      Pain Score 12/01/18 1048 0     Pain Loc --      Pain Edu? --      Excl. in [place]? --    No data found.  Updated Vital Signs BP (!) 161/83   Pulse 94   Temp (!) 97.4 F (36.3 C)   Resp 16   SpO2 96%   Visual Acuity Right Eye Distance:   Left Eye Distance:   Bilateral Distance:    Right Eye Near:   Left Eye Near:    Bilateral Near:     Physical Exam Vitals signs and nursing note reviewed.  Constitutional:      General: She is not in acute distress.    Appearance: Normal appearance. She is not ill-appearing, toxic-appearing or diaphoretic.  HENT:     Head: Normocephalic and atraumatic.     Nose: Nose normal.     Mouth/Throat:     Pharynx: Oropharynx is clear.  Eyes:     Conjunctiva/sclera: Conjunctivae normal.  Neck:     Musculoskeletal: Normal range of motion.  Cardiovascular:     Rate and Rhythm: Normal rate and regular  rhythm.     Pulses: Normal pulses.     Heart sounds: Normal heart sounds.  Comments: No S3 heart sounds Pulmonary:     Effort: Pulmonary effort is normal.     Breath sounds: Normal breath sounds.     Comments: No crackles Musculoskeletal: Normal range of motion.        General: No deformity or signs of injury.     Right lower leg: Edema present.     Left lower leg: Edema present.     Comments: Generalized, bilateral lower extremity tenderness and 1+ pitting edema.  Worse on the right.  no specific calf tenderness, swelling or erythema.   Neurological:     Mental Status: She is alert.  Psychiatric:        Mood and Affect: Mood normal.      UC Treatments / Results  Labs (all labs ordered are listed, but only abnormal results are displayed) Labs Reviewed - No data to display  EKG None  Radiology No results found.  Procedures Procedures (including critical care time)  Medications Ordered in UC Medications - No data to display  Initial Impression / Assessment and Plan / UC Course  I have reviewed the triage vital signs and the nursing notes.  Pertinent labs & imaging results that were available during my care of the patient were reviewed by me and considered in my medical decision making (see chart for details).     Peripheral edema-most likely due to patient sitting and activity, not elevating legs. She has no other concerning signs or symptoms to include chest pain, shortness of breath, calf pain or tenderness. No history of DVT or PE.  No recent long distance traveling or recent surgeries.  No concerns for CHF.  Lungs clear and heart sounds are normal Vital signs are stable.  She has been taking her blood pressure medication. Recent blood work revealed normal kidney function I feel safe to send patient home with Ace wrap's and have her elevate her legs.  Recommended she should purchase some TED hose or compression stockings these work better If her symptoms  continue or worsen she will need to follow-up with her primary care provider for further evaluation and management  Final Clinical Impressions(s) / UC Diagnoses   Final diagnoses:  Peripheral edema     Discharge Instructions     I would like for you to wear the ace wraps and elevate the legs to decrease the swelling Compression stockings would work better if you can purchase those.  If your symptoms worsen or you develop chest pain, SOB please follow up.     ED Prescriptions    None     Controlled Substance Prescriptions [place] Controlled Substance Registry consulted? Not Applicable   Orvan July, NP 12/01/18 1117

## 2018-12-02 ENCOUNTER — Telehealth (INDEPENDENT_AMBULATORY_CARE_PROVIDER_SITE_OTHER): Payer: Self-pay | Admitting: Orthopedic Surgery

## 2018-12-02 NOTE — Telephone Encounter (Signed)
Patient called left voicemail message  wanting Dr Marlou Sa to know that she is having bil leg swelling and want can she do about it. The number to contact patient is [phone]

## 2018-12-02 NOTE — Telephone Encounter (Signed)
Tried calling patient to advise. No answer, No VM to LM

## 2018-12-02 NOTE — Telephone Encounter (Signed)
Please advise.  Patient seen in ER yesterday.

## 2018-12-02 NOTE — Telephone Encounter (Signed)
I looked at the ER note.  I think that she may need to be on a fluid pill.  Probably should check with her primary care physician first.  DVT less likely but would have been best evaluated yesterday and there index of suspicion was low.  I think seeing her primary care provider for some advice on this would be advisable particularly since it is in both legs.  I agree with the ER doc recommendation about TED hose.  We can see her for evaluation after she seen her primary care provider if that is not fruitful.  And if it is an emergency.

## 2018-12-23 ENCOUNTER — Other Ambulatory Visit: Payer: Self-pay | Admitting: Family Medicine

## 2018-12-23 DIAGNOSIS — M1710 Unilateral primary osteoarthritis, unspecified knee: Secondary | ICD-10-CM

## 2019-01-06 ENCOUNTER — Ambulatory Visit: Payer: Medicaid Other | Admitting: Family Medicine

## 2019-01-08 ENCOUNTER — Encounter: Payer: Self-pay | Admitting: Gastroenterology

## 2019-01-12 ENCOUNTER — Ambulatory Visit: Payer: Medicaid Other | Admitting: Family Medicine

## 2019-01-13 ENCOUNTER — Ambulatory Visit: Payer: Medicaid Other

## 2019-02-22 ENCOUNTER — Ambulatory Visit: Payer: Medicaid Other

## 2019-04-03 ENCOUNTER — Other Ambulatory Visit: Payer: Self-pay | Admitting: Family Medicine

## 2019-04-03 DIAGNOSIS — I10 Essential (primary) hypertension: Secondary | ICD-10-CM

## 2019-05-25 ENCOUNTER — Emergency Department (HOSPITAL_COMMUNITY)
Admission: EM | Admit: 2019-05-25 | Discharge: 2019-05-25 | Disposition: A | Discharge status: Home / Self Care | Payer: Medicaid Other | Attending: Emergency Medicine | Admitting: Emergency Medicine

## 2019-05-25 ENCOUNTER — Encounter (HOSPITAL_COMMUNITY): Payer: Self-pay | Admitting: Emergency Medicine

## 2019-05-25 DIAGNOSIS — Z5321 Procedure and treatment not carried out due to patient leaving prior to being seen by health care provider: Secondary | ICD-10-CM | POA: Insufficient documentation

## 2019-05-25 DIAGNOSIS — R600 Localized edema: Secondary | ICD-10-CM | POA: Diagnosis present

## 2019-05-25 NOTE — ED Notes (Addendum)
Pt called twice in lobby, no answer

## 2019-05-25 NOTE — ED Notes (Signed)
Didn't answer when called back to room

## 2019-05-25 NOTE — ED Triage Notes (Signed)
Pt  Here from home with c/o bil lower leg swelling , no pain , slightly warm , right leg may be slightly larger than left , pt has been seen for same before

## 2019-05-28 ENCOUNTER — Telehealth: Payer: Self-pay | Admitting: Family Medicine

## 2019-05-31 ENCOUNTER — Ambulatory Visit: Payer: Medicaid Other | Attending: Family Medicine | Admitting: Family Medicine

## 2019-05-31 ENCOUNTER — Encounter: Payer: Self-pay | Admitting: Family Medicine

## 2019-05-31 ENCOUNTER — Other Ambulatory Visit: Payer: Self-pay

## 2019-05-31 VITALS — BP 156/91 | HR 97 | Temp 98.3°F | Ht 67.0 in | Wt 159.2 lb

## 2019-05-31 DIAGNOSIS — J322 Chronic ethmoidal sinusitis: Secondary | ICD-10-CM | POA: Diagnosis not present

## 2019-05-31 DIAGNOSIS — Z23 Encounter for immunization: Secondary | ICD-10-CM | POA: Diagnosis not present

## 2019-05-31 DIAGNOSIS — G4709 Other insomnia: Secondary | ICD-10-CM | POA: Diagnosis not present

## 2019-05-31 DIAGNOSIS — I10 Essential (primary) hypertension: Secondary | ICD-10-CM | POA: Diagnosis not present

## 2019-05-31 DIAGNOSIS — E7800 Pure hypercholesterolemia, unspecified: Secondary | ICD-10-CM | POA: Diagnosis not present

## 2019-05-31 DIAGNOSIS — J4520 Mild intermittent asthma, uncomplicated: Secondary | ICD-10-CM | POA: Diagnosis not present

## 2019-05-31 DIAGNOSIS — K219 Gastro-esophageal reflux disease without esophagitis: Secondary | ICD-10-CM | POA: Diagnosis not present

## 2019-05-31 DIAGNOSIS — R600 Localized edema: Secondary | ICD-10-CM

## 2019-05-31 MED ORDER — PANTOPRAZOLE SODIUM 20 MG PO TBEC: 20.0000 mg | DELAYED_RELEASE_TABLET | Freq: Every day | ORAL | 5 refills | Status: DC

## 2019-05-31 MED ORDER — FLUTICASONE-SALMETEROL 500-50 MCG/DOSE IN AEPB: INHALATION_SPRAY | RESPIRATORY_TRACT | 6 refills | Status: DC

## 2019-05-31 MED ORDER — PROAIR HFA 108 (90 BASE) MCG/ACT IN AERS: 2.0000 | INHALATION_SPRAY | Freq: Four times a day (QID) | RESPIRATORY_TRACT | 5 refills | Status: DC | PRN

## 2019-05-31 MED ORDER — ATORVASTATIN CALCIUM 10 MG PO TABS: 10.0000 mg | ORAL_TABLET | Freq: Every day | ORAL | 6 refills | Status: DC

## 2019-05-31 MED ORDER — GABAPENTIN 300 MG PO CAPS: ORAL_CAPSULE | ORAL | 6 refills | Status: DC

## 2019-05-31 MED ORDER — MONTELUKAST SODIUM 10 MG PO TABS: 10.0000 mg | ORAL_TABLET | Freq: Every day | ORAL | 6 refills | Status: DC

## 2019-05-31 MED ORDER — TRAZODONE HCL 50 MG PO TABS: 50.0000 mg | ORAL_TABLET | Freq: Every day | ORAL | 2 refills | Status: DC

## 2019-05-31 MED ORDER — FLUTICASONE PROPIONATE 50 MCG/ACT NA SUSP: 2.0000 | Freq: Every day | NASAL | 6 refills | Status: DC

## 2019-05-31 MED ORDER — LOSARTAN POTASSIUM-HCTZ 100-25 MG PO TABS: 1.0000 | ORAL_TABLET | Freq: Every day | ORAL | 1 refills | Status: DC

## 2019-05-31 NOTE — Progress Notes (Signed)
Subjective:  Patient ID: Angel Hebert, female    DOB: Mar 16, 1956  Age: 63 y.o. MRN: [id number]  CC: Leg Pain   HPI Angel Hebert is a 6 -year-old female with a history of hypertension, hyperlipidemia, asthma, allergic rhinitis, nasal polyps (Status post sinus surgery in 10/2016), GERD, bilateral knee osteoarthritis (status post left total knee arthroplasty in 09/2016) who comes in for a follow-up visit. On presentation her oxygen saturation was 83% and she was tachycardic to 134 however after resting for a few minutes oxygen saturation improved to 97 to 100% and heart rate normalized.  She states she just walked to the clinic from the bus stop.  Her allergic rhinitis has been controlled and asthma stable with no exacerbations. Her knees are doing well and she has no acute pain. She complains of bilateral pedal edema for the last couple of weeks but denies dyspnea, chest pain, weight gain, orthopnea or paroxysmal nocturnal dyspnea.  Endorses eating lots of chips from foods high in sodium. Reflux symptoms are stable and insomnia is controlled. Past Medical History:  Diagnosis Date  . Arthritis   . Asthma    last year in December  . Dyspnea   . GERD (gastroesophageal reflux disease)   . High cholesterol   . Hypertension   . Pneumonia 01/2017    Past Surgical History:  Procedure Laterality Date  . ABDOMINAL HYSTERECTOMY    . nasal polyps     removed just this past tuesday at [place] facility over by [facility]  . TOTAL KNEE ARTHROPLASTY Left 10/01/2016   Procedure: LEFT TOTAL KNEE ARTHROPLASTY;  Surgeon: Meredith Pel, MD;  Location: [place];  Service: Orthopedics;  Laterality: Left;    Family History  Problem Relation Age of Onset  . Diabetes Sister   . Colon cancer Neg Hx   . Breast cancer Neg Hx     Allergies  Allergen Reactions  . No Known Allergies     Outpatient Medications Prior to Visit  Medication Sig Dispense Refill  . albuterol (PROVENTIL) (2.5  MG/3ML) 0.083% nebulizer solution Take 3 mLs (2.5 mg total) by nebulization every 6 (six) hours as needed for wheezing or shortness of breath. 75 mL 3  . ALL DAY ALLERGY 10 MG tablet TAKE 1 TABLET BY MOUTH DAILY. 30 tablet 2  . ferrous sulfate 325 (65 FE) MG tablet Take 1 tablet (325 mg total) 2 (two) times daily with a meal by mouth. 60 tablet 3  . naproxen (NAPROSYN) 500 MG tablet TAKE 1 TABLET BY MOUTH 2 TIMES DAILY 30 tablet 0  . amLODipine (NORVASC) 10 MG tablet Take 1 tablet (10 mg total) by mouth daily. 30 tablet 6  . atorvastatin (LIPITOR) 10 MG tablet Take 1 tablet (10 mg total) by mouth daily at 6 PM. 30 tablet 6  . Fluticasone-Salmeterol (ADVAIR DISKUS) 500-50 MCG/DOSE AEPB INHALE 1 PUFF INTO THE LUNGS EVERY 12 (TWELVE) HOURS. 60 each 6  . gabapentin (NEURONTIN) 300 MG capsule TAKE 1 CAPSULE BY MOUTH 2 TIMES DAILY. 60 capsule 6  . losartan (COZAAR) 25 MG tablet Take 4 tablets (100 mg total) by mouth daily. Must make appt for more refills. 120 tablet 0  . montelukast (SINGULAIR) 10 MG tablet Take 1 tablet (10 mg total) by mouth at bedtime. 30 tablet 6  . pantoprazole (PROTONIX) 20 MG tablet Take 1 tablet (20 mg total) by mouth daily. 30 tablet 5  . PROAIR HFA 108 (90 Base) MCG/ACT inhaler INHALE 2 PUFFS EVERY 6 (SIX)  HOURS AS NEEDED INTO THE LUNGS FOR WHEEZING OR SHORTNESS OF BREATH. 8.5 g 5  . traZODone (DESYREL) 50 MG tablet Take 1 tablet (50 mg total) by mouth at bedtime. 30 tablet 2  . fluticasone (FLONASE) 50 MCG/ACT nasal spray Place 2 sprays into both nostrils daily. (Patient not taking: Reported on 05/31/2019) 16 g 6   No facility-administered medications prior to visit.      ROS Review of Systems  Constitutional: Negative for activity change, appetite change and fatigue.  HENT: Negative for congestion, sinus pressure and sore throat.   Eyes: Negative for visual disturbance.  Respiratory: Negative for cough, chest tightness, shortness of breath and wheezing.    Cardiovascular: Negative for chest pain and palpitations.  Gastrointestinal: Negative for abdominal distention, abdominal pain and constipation.  Endocrine: Negative for polydipsia.  Genitourinary: Negative for dysuria and frequency.  Musculoskeletal: Negative for arthralgias and back pain.  Skin: Negative for rash.  Neurological: Negative for tremors, light-headedness and numbness.  Hematological: Does not bruise/bleed easily.  Psychiatric/Behavioral: Negative for agitation and behavioral problems.    Objective:  BP (!) 156/91   Pulse 97   Temp 98.3 F (36.8 C) (Oral)   Ht 5\' 7"  (1.702 m)   Wt 159 lb 3.2 oz (72.2 kg)   SpO2 100%   BMI 24.93 kg/m   BP/Weight 05/31/2019 05/25/2019 [id number]  Systolic BP [id number] 0000000 [id number]  Diastolic BP 91 93 83  Wt. (Lbs) 159.2 - -  BMI 24.93 - -      Physical Exam Constitutional:      Appearance: She is well-developed.  Neck:     Vascular: No JVD.  Cardiovascular:     Rate and Rhythm: Normal rate.     Heart sounds: Normal heart sounds. No murmur.  Pulmonary:     Effort: Pulmonary effort is normal.     Breath sounds: Normal breath sounds. No wheezing or rales.  Chest:     Chest wall: No tenderness.  Abdominal:     General: Bowel sounds are normal. There is no distension.     Palpations: Abdomen is soft. There is no mass.     Tenderness: There is no abdominal tenderness.  Musculoskeletal: Normal range of motion.     Right lower leg: Edema (1+ non pitting) present.     Left lower leg: Edema (1+ non pitting) present.  Neurological:     Mental Status: She is alert and oriented to person, place, and time.  Psychiatric:        Mood and Affect: Mood normal.     CMP Latest Ref Rng & Units 10/22/2018 10/29/2017 03/03/2017  Glucose 65 - 99 mg/dL 74 77 79  BUN 8 - 27 mg/dL 6(L) 14 12  Creatinine 0.57 - 1.00 mg/dL 0.72 0.67 0.64  Sodium 134 - 144 mmol/L 144 143 139  Potassium 3.5 - 5.2 mmol/L 4.8 4.3 3.7  Chloride 96 - 106 mmol/L 104 102 101   CO2 20 - 29 mmol/L 25 23 22   Calcium 8.7 - 10.3 mg/dL 9.9 9.4 9.6  Total Protein 6.0 - 8.5 g/dL 7.7 7.1 7.5  Total Bilirubin 0.0 - 1.2 mg/dL <0.2 0.4 <0.2  Alkaline Phos 39 - 117 IU/L 74 125(H) 173(H)  AST 0 - 40 IU/L 18 34 13  ALT 0 - 32 IU/L 8 14 5     Lipid Panel     Component Value Date/Time   CHOL 185 10/22/2018 0920   TRIG 129 10/22/2018 0920   HDL 81 10/22/2018  0920   CHOLHDL 2.3 10/22/2018 0920   CHOLHDL 2.5 05/20/2016 1452   VLDL 26 05/20/2016 1452   LDLCALC 78 10/22/2018 0920    CBC    Component Value Date/Time   WBC 6.6 10/29/2017 1007   WBC 22.4 (H) 02/05/2017 0539   RBC 3.77 10/29/2017 1007   RBC 3.21 (L) 02/05/2017 0539   HGB 12.5 10/29/2017 1007   HCT 37.6 10/29/2017 1007   PLT 415 (H) 10/29/2017 1007   MCV 100 (H) 10/29/2017 1007   MCH 33.2 (H) 10/29/2017 1007   MCH 26.2 02/05/2017 0539   MCHC 33.2 10/29/2017 1007   MCHC 31.7 02/05/2017 0539   RDW 16.4 (H) 10/29/2017 1007   LYMPHSABS 1.7 10/29/2017 1007   MONOABS 1.1 (H) 02/04/2017 1030   EOSABS 0.4 10/29/2017 1007   BASOSABS 0.1 10/29/2017 1007    Lab Results  Component Value Date   HGBA1C 5.3 10/26/2015    Assessment & Plan:   1. Pedal edema Likely dependent She has no cardiac symptoms Will send off BNP Low sodium diet, use compression stockings - Brain natriuretic peptide - Basic Metabolic Panel  2. Pure hypercholesterolemia Controlled Low cholesetrol diet - atorvastatin (LIPITOR) 10 MG tablet; Take 1 tablet (10 mg total) by mouth daily at 6 PM.  Dispense: 30 tablet; Refill: 6  3. Mild intermittent asthma without complication Controlled - Fluticasone-Salmeterol (ADVAIR DISKUS) 500-50 MCG/DOSE AEPB; INHALE 1 PUFF INTO THE LUNGS EVERY 12 (TWELVE) HOURS.  Dispense: 60 each; Refill: 6 - montelukast (SINGULAIR) 10 MG tablet; Take 1 tablet (10 mg total) by mouth at bedtime.  Dispense: 30 tablet; Refill: 6 - PROAIR HFA 108 (90 Base) MCG/ACT inhaler; Inhale 2 puffs into the lungs every 6  (six) hours as needed for wheezing or shortness of breath.  Dispense: 8.5 g; Refill: 5  4. Gastroesophageal reflux disease, esophagitis presence not specified Stable - pantoprazole (PROTONIX) 20 MG tablet; Take 1 tablet (20 mg total) by mouth daily.  Dispense: 30 tablet; Refill: 5  5. Other insomnia Controlled - traZODone (DESYREL) 50 MG tablet; Take 1 tablet (50 mg total) by mouth at bedtime.  Dispense: 30 tablet; Refill: 2  6. Chronic ethmoidal sinusitis Controlled - fluticasone (FLONASE) 50 MCG/ACT nasal spray; Place 2 sprays into both nostrils daily.  Dispense: 16 g; Refill: 6  7. Essential hypertension Uncontrolled She attributes this to walking here from the bus stop Counseled on blood pressure goal of less than 130/80, low-sodium, DASH diet, medication compliance, 150 minutes of moderate intensity exercise per week. Discussed medication compliance, adverse effects. - losartan-hydrochlorothiazide (HYZAAR) 100-25 MG tablet; Take 1 tablet by mouth daily.  Dispense: 90 tablet; Refill: 1  8. Need for influenza vaccination - Flu Vaccine QUAD 36+ mos IM   Health Care Maintenance: Colonoscopy at next visit Meds ordered this encounter  Medications  . losartan-hydrochlorothiazide (HYZAAR) 100-25 MG tablet    Sig: Take 1 tablet by mouth daily.    Dispense:  90 tablet    Refill:  1    Discontinue Losartan, Amlodipine  . atorvastatin (LIPITOR) 10 MG tablet    Sig: Take 1 tablet (10 mg total) by mouth daily at 6 PM.    Dispense:  30 tablet    Refill:  6  . Fluticasone-Salmeterol (ADVAIR DISKUS) 500-50 MCG/DOSE AEPB    Sig: INHALE 1 PUFF INTO THE LUNGS EVERY 12 (TWELVE) HOURS.    Dispense:  60 each    Refill:  6  . gabapentin (NEURONTIN) 300 MG capsule  Sig: TAKE 1 CAPSULE BY MOUTH 2 TIMES DAILY.    Dispense:  60 capsule    Refill:  6  . montelukast (SINGULAIR) 10 MG tablet    Sig: Take 1 tablet (10 mg total) by mouth at bedtime.    Dispense:  30 tablet    Refill:  6  .  pantoprazole (PROTONIX) 20 MG tablet    Sig: Take 1 tablet (20 mg total) by mouth daily.    Dispense:  30 tablet    Refill:  5  . traZODone (DESYREL) 50 MG tablet    Sig: Take 1 tablet (50 mg total) by mouth at bedtime.    Dispense:  30 tablet    Refill:  2  . PROAIR HFA 108 (90 Base) MCG/ACT inhaler    Sig: Inhale 2 puffs into the lungs every 6 (six) hours as needed for wheezing or shortness of breath.    Dispense:  8.5 g    Refill:  5  . fluticasone (FLONASE) 50 MCG/ACT nasal spray    Sig: Place 2 sprays into both nostrils daily.    Dispense:  16 g    Refill:  6    Follow-up: Return in about 3 days (around 06/03/2019) for med recociliation with Lurena Joiner. 3 months PCP.       Charlott Rakes, MD, FAAFP. [facility] [place], [place]   05/31/2019, 10:25 AM

## 2019-05-31 NOTE — Progress Notes (Signed)
Patient is having swelling in both legs.

## 2019-06-01 LAB — BASIC METABOLIC PANEL
BUN/Creatinine Ratio: 21 (ref 12–28)
BUN: 12 mg/dL (ref 8–27)
CO2: 19 mmol/L — ABNORMAL LOW (ref 20–29)
Calcium: 8.7 mg/dL (ref 8.7–10.3)
Chloride: 104 mmol/L (ref 96–106)
Creatinine, Ser: 0.57 mg/dL (ref 0.57–1.00)
GFR calc Af Amer: 115 mL/min/{1.73_m2} (ref >59)
GFR calc non Af Amer: 100 mL/min/{1.73_m2} (ref >59)
Glucose: 84 mg/dL (ref 65–99)
Potassium: 4.5 mmol/L (ref 3.5–5.2)
Sodium: 143 mmol/L (ref 134–144)

## 2019-06-01 LAB — BRAIN NATRIURETIC PEPTIDE: BNP: 244.4 pg/mL — ABNORMAL HIGH (ref 0.0–100.0)

## 2019-06-04 ENCOUNTER — Telehealth: Payer: Self-pay

## 2019-06-04 ENCOUNTER — Other Ambulatory Visit: Payer: Self-pay

## 2019-06-04 ENCOUNTER — Ambulatory Visit: Payer: Medicaid Other | Attending: Family Medicine | Admitting: Pharmacist

## 2019-06-04 DIAGNOSIS — R600 Localized edema: Secondary | ICD-10-CM | POA: Insufficient documentation

## 2019-06-04 DIAGNOSIS — J322 Chronic ethmoidal sinusitis: Secondary | ICD-10-CM | POA: Insufficient documentation

## 2019-06-04 DIAGNOSIS — J4520 Mild intermittent asthma, uncomplicated: Secondary | ICD-10-CM | POA: Diagnosis not present

## 2019-06-04 DIAGNOSIS — Z7951 Long term (current) use of inhaled steroids: Secondary | ICD-10-CM | POA: Insufficient documentation

## 2019-06-04 DIAGNOSIS — E7800 Pure hypercholesterolemia, unspecified: Secondary | ICD-10-CM | POA: Insufficient documentation

## 2019-06-04 DIAGNOSIS — K219 Gastro-esophageal reflux disease without esophagitis: Secondary | ICD-10-CM | POA: Diagnosis not present

## 2019-06-04 DIAGNOSIS — G4709 Other insomnia: Secondary | ICD-10-CM | POA: Diagnosis not present

## 2019-06-04 DIAGNOSIS — I10 Essential (primary) hypertension: Secondary | ICD-10-CM | POA: Insufficient documentation

## 2019-06-04 DIAGNOSIS — Z23 Encounter for immunization: Secondary | ICD-10-CM | POA: Insufficient documentation

## 2019-06-04 DIAGNOSIS — Z79899 Other long term (current) drug therapy: Secondary | ICD-10-CM | POA: Insufficient documentation

## 2019-06-04 NOTE — Telephone Encounter (Signed)
-----   Message from Charlott Rakes, MD sent at 06/01/2019  5:00 PM EDT ----- Labs do not point towards heart failure

## 2019-06-04 NOTE — Telephone Encounter (Signed)
Patient name and DOB has been verified Patient was informed of lab results. Patient had no questions.  

## 2019-06-04 NOTE — Progress Notes (Signed)
Medication Adherence Questionnaire (A score of 2 or more points indicates risk for nonadherence)  Do you know what each of your medicines is for? 0 (1 point if no)  Do you ever have trouble remembering to take your medicine? 0 (2 points if yes)  Do you ever not take a medicine because you feel you do not need it?  0 (1 point if yes)  Do you think that any of your medicines is not helping you? 0 (1 point if yes)  Do you have any physical problems such as vision loss that keep you from taking your medicines as prescribed?  0 (2 points if yes)  Do you think any of your medicine is causing a side effect?  0 (1 point if yes)  Do you know the names of ALL of your medicines? 1 (1 point if no)  Do you think that you need ALL of your medicines? 0 (1 point if no)  In the past 6 months, have you missed getting a refill or a new prescription filled on time?  1 (1 point if yes)  How often do you miss taking a dose of medicine?  0 Never (0 points), 1 or 2 times a month (0 points), 1 time a week (2 points), 2 or more times a week (2 points).   TOTAL SCORE 2    Patient has known adherence challenges. Barriers include: occasional forgetfulness. Pt did present with all of her medications with the exception of Hyzaar. This was recently added by Dr. Margarita Rana. I have instructed her to pick this up from our pharmacy today and she is agreeable to do so. She verbalizes understanding that this will replace her amlodipine and losartan.   Pt's single agent losartan and amlodipine has been stopped d/t LE edema. She has not taken her losartan or amlodipine, and I have taken those bottles from her today to minimize confusion.   Benard Halsted, PharmD, [place] [phone]

## 2019-07-12 ENCOUNTER — Emergency Department (HOSPITAL_COMMUNITY): Payer: Medicaid Other

## 2019-07-12 ENCOUNTER — Encounter (HOSPITAL_COMMUNITY): Payer: Self-pay | Admitting: Pharmacy Technician

## 2019-07-12 ENCOUNTER — Other Ambulatory Visit: Payer: Self-pay

## 2019-07-12 ENCOUNTER — Inpatient Hospital Stay (HOSPITAL_COMMUNITY)
Admission: EM | Admit: 2019-07-12 | Discharge: 2019-07-21 | DRG: 871 | Disposition: A | Discharge status: Home Health | Payer: Medicaid Other | Attending: Internal Medicine | Admitting: Internal Medicine

## 2019-07-12 DIAGNOSIS — R109 Unspecified abdominal pain: Secondary | ICD-10-CM | POA: Diagnosis not present

## 2019-07-12 DIAGNOSIS — R9082 White matter disease, unspecified: Secondary | ICD-10-CM | POA: Diagnosis present

## 2019-07-12 DIAGNOSIS — M4856XA Collapsed vertebra, not elsewhere classified, lumbar region, initial encounter for fracture: Secondary | ICD-10-CM | POA: Diagnosis present

## 2019-07-12 DIAGNOSIS — D649 Anemia, unspecified: Secondary | ICD-10-CM | POA: Diagnosis not present

## 2019-07-12 DIAGNOSIS — R4182 Altered mental status, unspecified: Secondary | ICD-10-CM | POA: Diagnosis not present

## 2019-07-12 DIAGNOSIS — I110 Hypertensive heart disease with heart failure: Secondary | ICD-10-CM | POA: Diagnosis present

## 2019-07-12 DIAGNOSIS — E7800 Pure hypercholesterolemia, unspecified: Secondary | ICD-10-CM | POA: Diagnosis present

## 2019-07-12 DIAGNOSIS — E785 Hyperlipidemia, unspecified: Secondary | ICD-10-CM | POA: Diagnosis not present

## 2019-07-12 DIAGNOSIS — Z7951 Long term (current) use of inhaled steroids: Secondary | ICD-10-CM

## 2019-07-12 DIAGNOSIS — R079 Chest pain, unspecified: Secondary | ICD-10-CM | POA: Diagnosis not present

## 2019-07-12 DIAGNOSIS — M1990 Unspecified osteoarthritis, unspecified site: Secondary | ICD-10-CM | POA: Diagnosis not present

## 2019-07-12 DIAGNOSIS — K7682 Hepatic encephalopathy: Secondary | ICD-10-CM

## 2019-07-12 DIAGNOSIS — R52 Pain, unspecified: Secondary | ICD-10-CM | POA: Diagnosis not present

## 2019-07-12 DIAGNOSIS — R0689 Other abnormalities of breathing: Secondary | ICD-10-CM | POA: Diagnosis not present

## 2019-07-12 DIAGNOSIS — Z79899 Other long term (current) drug therapy: Secondary | ICD-10-CM | POA: Diagnosis not present

## 2019-07-12 DIAGNOSIS — Z66 Do not resuscitate: Secondary | ICD-10-CM | POA: Diagnosis present

## 2019-07-12 DIAGNOSIS — A419 Sepsis, unspecified organism: Secondary | ICD-10-CM | POA: Diagnosis present

## 2019-07-12 DIAGNOSIS — R0902 Hypoxemia: Secondary | ICD-10-CM | POA: Diagnosis not present

## 2019-07-12 DIAGNOSIS — J13 Pneumonia due to Streptococcus pneumoniae: Secondary | ICD-10-CM | POA: Diagnosis not present

## 2019-07-12 DIAGNOSIS — R918 Other nonspecific abnormal finding of lung field: Secondary | ICD-10-CM | POA: Diagnosis not present

## 2019-07-12 DIAGNOSIS — K219 Gastro-esophageal reflux disease without esophagitis: Secondary | ICD-10-CM | POA: Diagnosis present

## 2019-07-12 DIAGNOSIS — R5381 Other malaise: Secondary | ICD-10-CM

## 2019-07-12 DIAGNOSIS — G9341 Metabolic encephalopathy: Secondary | ICD-10-CM | POA: Diagnosis present

## 2019-07-12 DIAGNOSIS — J45909 Unspecified asthma, uncomplicated: Secondary | ICD-10-CM | POA: Diagnosis present

## 2019-07-12 DIAGNOSIS — K7290 Hepatic failure, unspecified without coma: Secondary | ICD-10-CM | POA: Diagnosis not present

## 2019-07-12 DIAGNOSIS — A403 Sepsis due to Streptococcus pneumoniae: Secondary | ICD-10-CM | POA: Diagnosis present

## 2019-07-12 DIAGNOSIS — R1011 Right upper quadrant pain: Secondary | ICD-10-CM

## 2019-07-12 DIAGNOSIS — I340 Nonrheumatic mitral (valve) insufficiency: Secondary | ICD-10-CM | POA: Diagnosis not present

## 2019-07-12 DIAGNOSIS — I10 Essential (primary) hypertension: Secondary | ICD-10-CM | POA: Diagnosis present

## 2019-07-12 DIAGNOSIS — J181 Lobar pneumonia, unspecified organism: Secondary | ICD-10-CM | POA: Diagnosis present

## 2019-07-12 DIAGNOSIS — E8809 Other disorders of plasma-protein metabolism, not elsewhere classified: Secondary | ICD-10-CM | POA: Diagnosis present

## 2019-07-12 DIAGNOSIS — Z20828 Contact with and (suspected) exposure to other viral communicable diseases: Secondary | ICD-10-CM | POA: Diagnosis present

## 2019-07-12 DIAGNOSIS — J189 Pneumonia, unspecified organism: Secondary | ICD-10-CM | POA: Diagnosis not present

## 2019-07-12 DIAGNOSIS — I5021 Acute systolic (congestive) heart failure: Secondary | ICD-10-CM

## 2019-07-12 DIAGNOSIS — J9601 Acute respiratory failure with hypoxia: Secondary | ICD-10-CM | POA: Diagnosis present

## 2019-07-12 DIAGNOSIS — Z96652 Presence of left artificial knee joint: Secondary | ICD-10-CM | POA: Diagnosis present

## 2019-07-12 DIAGNOSIS — B953 Streptococcus pneumoniae as the cause of diseases classified elsewhere: Secondary | ICD-10-CM | POA: Diagnosis not present

## 2019-07-12 DIAGNOSIS — E876 Hypokalemia: Secondary | ICD-10-CM | POA: Diagnosis not present

## 2019-07-12 DIAGNOSIS — A409 Streptococcal sepsis, unspecified: Secondary | ICD-10-CM | POA: Diagnosis not present

## 2019-07-12 DIAGNOSIS — J154 Pneumonia due to other streptococci: Secondary | ICD-10-CM | POA: Diagnosis present

## 2019-07-12 DIAGNOSIS — R0602 Shortness of breath: Secondary | ICD-10-CM | POA: Diagnosis not present

## 2019-07-12 DIAGNOSIS — R0682 Tachypnea, not elsewhere classified: Secondary | ICD-10-CM

## 2019-07-12 DIAGNOSIS — R0781 Pleurodynia: Secondary | ICD-10-CM | POA: Diagnosis not present

## 2019-07-12 DIAGNOSIS — Z23 Encounter for immunization: Secondary | ICD-10-CM

## 2019-07-12 DIAGNOSIS — B955 Unspecified streptococcus as the cause of diseases classified elsewhere: Secondary | ICD-10-CM | POA: Diagnosis not present

## 2019-07-12 DIAGNOSIS — J90 Pleural effusion, not elsewhere classified: Secondary | ICD-10-CM | POA: Diagnosis not present

## 2019-07-12 DIAGNOSIS — R7881 Bacteremia: Secondary | ICD-10-CM | POA: Diagnosis not present

## 2019-07-12 DIAGNOSIS — G4709 Other insomnia: Secondary | ICD-10-CM

## 2019-07-12 DIAGNOSIS — D72829 Elevated white blood cell count, unspecified: Secondary | ICD-10-CM | POA: Diagnosis not present

## 2019-07-12 DIAGNOSIS — Z833 Family history of diabetes mellitus: Secondary | ICD-10-CM

## 2019-07-12 LAB — COMPREHENSIVE METABOLIC PANEL
ALT: 21 U/L (ref 0–44)
AST: 70 U/L — ABNORMAL HIGH (ref 15–41)
Albumin: 2.3 g/dL — ABNORMAL LOW (ref 3.5–5.0)
Alkaline Phosphatase: 83 U/L (ref 38–126)
Anion gap: 14 (ref 5–15)
BUN: 20 mg/dL (ref 8–23)
CO2: 20 mmol/L — ABNORMAL LOW (ref 22–32)
Calcium: 8.7 mg/dL — ABNORMAL LOW (ref 8.9–10.3)
Chloride: 101 mmol/L (ref 98–111)
Creatinine, Ser: 0.85 mg/dL (ref 0.44–1.00)
GFR calc Af Amer: >60 mL/min (ref >60)
GFR calc non Af Amer: >60 mL/min (ref >60)
Glucose, Bld: 147 mg/dL — ABNORMAL HIGH (ref 70–99)
Potassium: 2 mmol/L — CL (ref 3.5–5.1)
Sodium: 135 mmol/L (ref 135–145)
Total Bilirubin: 1.9 mg/dL — ABNORMAL HIGH (ref 0.3–1.2)
Total Protein: 7.6 g/dL (ref 6.5–8.1)

## 2019-07-12 LAB — LIPASE, BLOOD: Lipase: 286 U/L — ABNORMAL HIGH (ref 11–51)

## 2019-07-12 LAB — CBC WITH DIFFERENTIAL/PLATELET
Abs Immature Granulocytes: 0 10*3/uL (ref 0.00–0.07)
Basophils Absolute: 0 10*3/uL (ref 0.0–0.1)
Basophils Relative: 0 %
Eosinophils Absolute: 0 10*3/uL (ref 0.0–0.5)
Eosinophils Relative: 0 %
HCT: 17.3 % — ABNORMAL LOW (ref 36.0–46.0)
Hemoglobin: 5.2 g/dL — CL (ref 12.0–15.0)
Lymphocytes Relative: 1 %
Lymphs Abs: 0.4 10*3/uL — ABNORMAL LOW (ref 0.7–4.0)
MCH: 22 pg — ABNORMAL LOW (ref 26.0–34.0)
MCHC: 30.1 g/dL (ref 30.0–36.0)
MCV: 73.3 fL — ABNORMAL LOW (ref 80.0–100.0)
Monocytes Absolute: 1.1 10*3/uL — ABNORMAL HIGH (ref 0.1–1.0)
Monocytes Relative: 3 %
Neutro Abs: 36.6 10*3/uL — ABNORMAL HIGH (ref 1.7–7.7)
Neutrophils Relative %: 96 %
Platelets: 590 10*3/uL — ABNORMAL HIGH (ref 150–400)
RBC: 2.36 MIL/uL — ABNORMAL LOW (ref 3.87–5.11)
RDW: 28.2 % — ABNORMAL HIGH (ref 11.5–15.5)
WBC: 38.1 10*3/uL — ABNORMAL HIGH (ref 4.0–10.5)
nRBC: 0 /100 WBC
nRBC: 0.2 % (ref 0.0–0.2)

## 2019-07-12 LAB — PREPARE RBC (CROSSMATCH)

## 2019-07-12 LAB — LACTIC ACID, PLASMA
Lactic Acid, Venous: 1.8 mmol/L (ref 0.5–1.9)
Lactic Acid, Venous: 1.5 mmol/L (ref 0.5–1.9)

## 2019-07-12 LAB — SARS CORONAVIRUS 2 (TAT 6-24 HRS): SARS Coronavirus 2: NEGATIVE

## 2019-07-12 LAB — POC OCCULT BLOOD, ED: Fecal Occult Bld: NEGATIVE

## 2019-07-12 LAB — HIV ANTIBODY (ROUTINE TESTING W REFLEX): HIV Screen 4th Generation wRfx: NONREACTIVE

## 2019-07-12 LAB — MAGNESIUM: Magnesium: 2 mg/dL (ref 1.7–2.4)

## 2019-07-12 MED ORDER — SODIUM CHLORIDE 0.9 % IV BOLUS
1000.0000 mL | Freq: Once | INTRAVENOUS | Status: AC
  Administered 2019-07-12: 1000 mL via INTRAVENOUS

## 2019-07-12 MED ORDER — POTASSIUM CHLORIDE CRYS ER 20 MEQ PO TBCR
60.0000 meq | EXTENDED_RELEASE_TABLET | Freq: Once | ORAL | Status: AC
  Administered 2019-07-12: 60 meq via ORAL
  Filled 2019-07-12: qty 3

## 2019-07-12 MED ORDER — METRONIDAZOLE IN NACL 5-0.79 MG/ML-% IV SOLN
500.0000 mg | Freq: Once | INTRAVENOUS | Status: AC
  Administered 2019-07-12: 500 mg via INTRAVENOUS
  Filled 2019-07-12: qty 100

## 2019-07-12 MED ORDER — LORAZEPAM 2 MG/ML IJ SOLN
0.0000 mg | Freq: Two times a day (BID) | INTRAMUSCULAR | Status: AC
  Administered 2019-07-15: 2 mg via INTRAVENOUS
  Filled 2019-07-12: qty 1

## 2019-07-12 MED ORDER — POTASSIUM CHLORIDE 10 MEQ/100ML IV SOLN
10.0000 meq | INTRAVENOUS | Status: AC
  Administered 2019-07-12: 10 meq via INTRAVENOUS
  Filled 2019-07-12: qty 100

## 2019-07-12 MED ORDER — IOHEXOL 300 MG/ML  SOLN
100.0000 mL | Freq: Once | INTRAMUSCULAR | Status: AC | PRN
  Administered 2019-07-12: 100 mL via INTRAVENOUS

## 2019-07-12 MED ORDER — MORPHINE SULFATE (PF) 4 MG/ML IV SOLN
4.0000 mg | Freq: Once | INTRAVENOUS | Status: AC
  Administered 2019-07-12: 4 mg via INTRAVENOUS
  Filled 2019-07-12: qty 1

## 2019-07-12 MED ORDER — THIAMINE HCL 100 MG/ML IJ SOLN
100.0000 mg | Freq: Every day | INTRAMUSCULAR | Status: DC
  Filled 2019-07-12: qty 2

## 2019-07-12 MED ORDER — VANCOMYCIN HCL 10 G IV SOLR
1500.0000 mg | Freq: Once | INTRAVENOUS | Status: AC
  Administered 2019-07-12: 1500 mg via INTRAVENOUS
  Filled 2019-07-12: qty 1500

## 2019-07-12 MED ORDER — LORAZEPAM 2 MG/ML IJ SOLN
0.0000 mg | Freq: Four times a day (QID) | INTRAMUSCULAR | Status: AC
  Administered 2019-07-13: 1 mg via INTRAVENOUS
  Filled 2019-07-12: qty 1

## 2019-07-12 MED ORDER — SODIUM CHLORIDE 0.9 % IV SOLN
2.0000 g | Freq: Once | INTRAVENOUS | Status: AC
  Administered 2019-07-12: 2 g via INTRAVENOUS
  Filled 2019-07-12: qty 2

## 2019-07-12 MED ORDER — LORAZEPAM 1 MG PO TABS
0.0000 mg | ORAL_TABLET | Freq: Four times a day (QID) | ORAL | Status: AC
  Administered 2019-07-13 – 2019-07-14 (×2): 1 mg via ORAL
  Filled 2019-07-12 (×2): qty 1

## 2019-07-12 MED ORDER — ONDANSETRON HCL 4 MG PO TABS: 4.0000 mg | ORAL_TABLET | Freq: Four times a day (QID) | ORAL | Status: DC | PRN

## 2019-07-12 MED ORDER — VITAMIN B-1 100 MG PO TABS
100.0000 mg | ORAL_TABLET | Freq: Every day | ORAL | Status: DC
  Administered 2019-07-12 – 2019-07-21 (×9): 100 mg via ORAL
  Filled 2019-07-12 (×10): qty 1

## 2019-07-12 MED ORDER — ACETAMINOPHEN 650 MG RE SUPP
325.0000 mg | Freq: Once | RECTAL | Status: AC
  Administered 2019-07-12: 325 mg via RECTAL
  Filled 2019-07-12: qty 1

## 2019-07-12 MED ORDER — POTASSIUM CHLORIDE IN NACL 40-0.9 MEQ/L-% IV SOLN
INTRAVENOUS | Status: DC
  Administered 2019-07-12: 100 mL/h via INTRAVENOUS
  Administered 2019-07-13: 75 mL/h via INTRAVENOUS
  Filled 2019-07-12 (×2): qty 1000

## 2019-07-12 MED ORDER — LORAZEPAM 1 MG PO TABS: 0.0000 mg | ORAL_TABLET | Freq: Two times a day (BID) | ORAL | Status: AC

## 2019-07-12 MED ORDER — POTASSIUM CHLORIDE 10 MEQ/100ML IV SOLN
10.0000 meq | INTRAVENOUS | Status: AC
  Administered 2019-07-12 (×3): 10 meq via INTRAVENOUS
  Filled 2019-07-12 (×3): qty 100

## 2019-07-12 MED ORDER — SODIUM CHLORIDE 0.9 % IV SOLN
2.0000 g | Freq: Three times a day (TID) | INTRAVENOUS | Status: DC
  Administered 2019-07-12 – 2019-07-14 (×5): 2 g via INTRAVENOUS
  Filled 2019-07-12 (×5): qty 2

## 2019-07-12 MED ORDER — SODIUM CHLORIDE 0.9 % IV SOLN: 10.0000 mL/h | Freq: Once | INTRAVENOUS | Status: DC

## 2019-07-12 MED ORDER — VANCOMYCIN HCL 10 G IV SOLR
1500.0000 mg | INTRAVENOUS | Status: DC
  Administered 2019-07-13: 1500 mg via INTRAVENOUS
  Filled 2019-07-12 (×2): qty 1500

## 2019-07-12 MED ORDER — ACETAMINOPHEN 650 MG RE SUPP
650.0000 mg | Freq: Four times a day (QID) | RECTAL | Status: DC | PRN
  Administered 2019-07-12: 650 mg via RECTAL
  Filled 2019-07-12 (×2): qty 1

## 2019-07-12 MED ORDER — ONDANSETRON HCL 4 MG/2ML IJ SOLN: 4.0000 mg | Freq: Four times a day (QID) | INTRAMUSCULAR | Status: DC | PRN

## 2019-07-12 NOTE — ED Provider Notes (Signed)
[name] EMERGENCY DEPARTMENT Provider Note   CSN: [id number] Arrival date & time: 07/12/19  1308     History   Chief Complaint Chief Complaint  Patient presents with  . Shortness of Breath    HPI Angel Hebert is a 63 y.o. female.     HPI   63 year old female with right flank pain.  Onset yesterday.  Persistent since then.  Pain is worse with deep inspiration.  Mild dyspnea.  No cough.  Subjective fevers and reported temperature of 102 for EMS.  Afebrile here in the emergency room.  No nausea, vomiting or diarrhea. No sick contacts that she is aware of.  Past Medical History:  Diagnosis Date  . Arthritis   . Asthma    last year in December  . Dyspnea   . GERD (gastroesophageal reflux disease)   . High cholesterol   . Hypertension   . Pneumonia 01/2017    Patient Active Problem List   Diagnosis Date Noted  . Insomnia 03/03/2017  . 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 Presence of left artificial knee joint 10/14/2016  . Arthritis of knee 10/01/2016  . Unilateral primary osteoarthritis, left knee 12/13/2015  . Benign essential HTN   . Physical deconditioning   . Anemia, iron deficiency   . Pressure ulcer 10/14/2015  . CAP (community acquired pneumonia)   . Pleural effusion   . Sepsis ([place])   . Sepsis due to Streptococcus pneumoniae ([place])   . Bacteremia   . Community acquired pneumonia 10/08/2015  . Acute sinusitis 12/02/2014  . Sinusitis 03/17/2013  . Hyperlipidemia 03/02/2007  . Essential hypertension 03/02/2007  . Allergic rhinitis 03/02/2007  . Asthma 03/02/2007  . GERD 03/02/2007    Past Surgical History:  Procedure Laterality Date  . ABDOMINAL HYSTERECTOMY    . nasal polyps     removed just this past tuesday at [place] facility over by [facility]  . TOTAL KNEE ARTHROPLASTY Left 10/01/2016   Procedure: LEFT TOTAL KNEE ARTHROPLASTY;  Surgeon: Meredith Pel, MD;  Location: [facility];  Service:  Orthopedics;  Laterality: Left;     OB History   No obstetric history on file.      Home Medications    Prior to Admission medications   Medication Sig Start Date End Date Taking? Authorizing Provider  albuterol (PROVENTIL) (2.5 MG/3ML) 0.083% nebulizer solution Take 3 mLs (2.5 mg total) by nebulization every 6 (six) hours as needed for wheezing or shortness of breath. 10/19/18   Charlott Rakes, MD  ALL DAY ALLERGY 10 MG tablet TAKE 1 TABLET BY MOUTH DAILY. 01/28/17   Charlott Rakes, MD  atorvastatin (LIPITOR) 10 MG tablet Take 1 tablet (10 mg total) by mouth daily at 6 PM. 05/31/19   Charlott Rakes, MD  fluticasone (FLONASE) 50 MCG/ACT nasal spray Place 2 sprays into both nostrils daily. 05/31/19   Charlott Rakes, MD  Fluticasone-Salmeterol (ADVAIR DISKUS) 500-50 MCG/DOSE AEPB INHALE 1 PUFF INTO THE LUNGS EVERY 12 (TWELVE) HOURS. 05/31/19   Charlott Rakes, MD  gabapentin (NEURONTIN) 300 MG capsule TAKE 1 CAPSULE BY MOUTH 2 TIMES DAILY. 05/31/19   Charlott Rakes, MD  losartan-hydrochlorothiazide (HYZAAR) 100-25 MG tablet Take 1 tablet by mouth daily. Patient not taking: Reported on 06/04/2019 05/31/19   Charlott Rakes, MD  montelukast (SINGULAIR) 10 MG tablet Take 1 tablet (10 mg total) by mouth at bedtime. 05/31/19   Charlott Rakes, MD  naproxen (NAPROSYN) 500 MG tablet TAKE 1 TABLET BY MOUTH 2 TIMES  DAILY 12/25/18   Charlott Rakes, MD  pantoprazole (PROTONIX) 20 MG tablet Take 1 tablet (20 mg total) by mouth daily. 05/31/19   Charlott Rakes, MD  PROAIR HFA 108 (90 Base) MCG/ACT inhaler Inhale 2 puffs into the lungs every 6 (six) hours as needed for wheezing or shortness of breath. 05/31/19   Charlott Rakes, MD  traZODone (DESYREL) 50 MG tablet Take 1 tablet (50 mg total) by mouth at bedtime. 05/31/19   Charlott Rakes, MD    Family History Family History  Problem Relation Age of Onset  . Diabetes Sister   . Colon cancer Neg Hx   . Breast cancer Neg Hx     Social History Social  History   Tobacco Use  . Smoking status: Never Smoker  . Smokeless tobacco: Never Used  Substance Use Topics  . Alcohol use: No    Comment: Quit years ago  . Drug use: No     Allergies   No known allergies   Review of Systems Review of Systems  All systems reviewed and negative, other than as noted in HPI.  Physical Exam Updated Vital Signs BP (!) 113/50   Pulse (!) 102   Temp 99.2 F (37.3 C) (Rectal)   Resp (!) 33   SpO2 96%   Physical Exam Vitals signs and nursing note reviewed.  Constitutional:      General: She is not in acute distress.    Appearance: She is well-developed.  HENT:     Head: Normocephalic and atraumatic.  Eyes:     General:        Right eye: No discharge.        Left eye: No discharge.     Conjunctiva/sclera: Conjunctivae normal.  Neck:     Musculoskeletal: Neck supple.  Cardiovascular:     Rate and Rhythm: Regular rhythm. Tachycardia present.     Heart sounds: Normal heart sounds. No murmur. No friction rub. No gallop.   Pulmonary:     Effort: Pulmonary effort is normal. No respiratory distress.     Breath sounds: Normal breath sounds.  Abdominal:     General: There is no distension.     Palpations: Abdomen is soft.     Tenderness: There is abdominal tenderness.     Comments: Tenderness to palpation in the right upper quadrant and right flank.  Musculoskeletal:        General: No tenderness.  Skin:    General: Skin is warm and dry.  Neurological:     Mental Status: She is alert.  Psychiatric:        Behavior: Behavior normal.        Thought Content: Thought content normal.      ED Treatments / Results  Labs (all labs ordered are listed, but only abnormal results are displayed) Labs Reviewed  CBC WITH DIFFERENTIAL/PLATELET - Abnormal; Notable for the following components:      Result Value   WBC 38.1 (*)    RBC 2.36 (*)    Hemoglobin 5.2 (*)    HCT 17.3 (*)    MCV 73.3 (*)    MCH 22.0 (*)    RDW 28.2 (*)     Platelets 590 (*)    Neutro Abs 36.6 (*)    Lymphs Abs 0.4 (*)    Monocytes Absolute 1.1 (*)    All other components within normal limits  COMPREHENSIVE METABOLIC PANEL - Abnormal; Notable for the following components:   Potassium 2.0 (*)  CO2 20 (*)    Glucose, Bld 147 (*)    Calcium 8.7 (*)    Albumin 2.3 (*)    AST 70 (*)    Total Bilirubin 1.9 (*)    All other components within normal limits  LIPASE, BLOOD - Abnormal; Notable for the following components:   Lipase 286 (*)    All other components within normal limits  SARS CORONAVIRUS 2 (TAT 6-24 HRS)  CULTURE, BLOOD (ROUTINE X 2)  CULTURE, BLOOD (ROUTINE X 2)  URINALYSIS, ROUTINE W REFLEX MICROSCOPIC  OCCULT BLOOD X 1 CARD TO LAB, STOOL  MAGNESIUM  LACTIC ACID, PLASMA  LACTIC ACID, PLASMA  TYPE AND SCREEN  PREPARE RBC (CROSSMATCH)    EKG EKG Interpretation  Date/Time:  Monday July 12 2019 13:13:04 EST Ventricular Rate:  108 PR Interval:    QRS Duration: 81 QT Interval:  369 QTC Calculation: 495 R Axis:   66 Text Interpretation: Sinus tachycardia Sinus pause Probable left atrial enlargement Nonspecific repol abnormality, inferior leads Borderline prolonged QT interval Confirmed by Virgel Manifold [phone]) on 07/12/2019 1:38:35 PM   Radiology Dg Chest Portable 1 View  Result Date: 07/12/2019 CLINICAL DATA:  Right-sided chest pain and shortness of breath for 1 day. EXAM: PORTABLE CHEST 1 VIEW COMPARISON:  Chest x-ray 02/07/2017 FINDINGS: The heart is upper limits of normal in size. There is mild tortuosity and calcification of the thoracic aorta. There are patchy bibasilar infiltrates. No pleural effusions. No pulmonary edema. The bony thorax is intact. IMPRESSION: Bibasilar infiltrates. Electronically Signed   By: Marijo Sanes M.D.   On: 07/12/2019 13:39    Procedures Procedures (including critical care time)  CRITICAL CARE Performed by: Virgel Manifold Total critical care time: 35 minutes Critical care time  was exclusive of separately billable procedures and treating other patients. Critical care was necessary to treat or prevent imminent or life-threatening deterioration. Critical care was time spent personally by me on the following activities: development of treatment plan with patient and/or surrogate as well as nursing, discussions with consultants, evaluation of patient's response to treatment, examination of patient, obtaining history from patient or surrogate, ordering and performing treatments and interventions, ordering and review of laboratory studies, ordering and review of radiographic studies, pulse oximetry and re-evaluation of patient's condition.   Medications Ordered in ED Medications  ceFEPIme (MAXIPIME) 2 g in sodium chloride 0.9 % 100 mL IVPB (has no administration in time range)  vancomycin (VANCOCIN) 1,500 mg in sodium chloride 0.9 % 500 mL IVPB (has no administration in time range)  0.9 %  sodium chloride infusion (has no administration in time range)  potassium chloride SA (KLOR-CON) CR tablet 60 mEq (has no administration in time range)  potassium chloride 10 mEq in 100 mL IVPB (has no administration in time range)  metroNIDAZOLE (FLAGYL) IVPB 500 mg (has no administration in time range)  sodium chloride 0.9 % bolus 1,000 mL (has no administration in time range)  morphine 4 MG/ML injection 4 mg (4 mg Intravenous Given 07/12/19 1410)  iohexol (OMNIPAQUE) 300 MG/ML solution 100 mL (100 mLs Intravenous Contrast Given 07/12/19 1513)     Initial Impression / Assessment and Plan / ED Course  I have reviewed the triage vital signs and the nursing notes.  Pertinent labs & imaging results that were available during my care of the patient were reviewed by me and considered in my medical decision making (see chart for details).       62 year old female with right upper quadrant/right flank  pain subjective fevers.  Pain is pleuritic in nature.  Chest x-ray with bibasilar  infiltrates.  Unclear if her pain is secondary to a lower lobe inflammatory process versus something intra-abdominal.  She is tender on exam.  Will CT the abdomen pelvis.  Also noted to be very anemic.  She denies any overt bleeding or melena.  Check Hemoccult.  Will transfuse.  Severe hypokalemia.  Supplement.  Check mag.  Empiric antibiotics ordered. Will require admission pending CT a/p. Care signed out to Dr Vanita Panda.   Final Clinical Impressions(s) / ED Diagnoses   Final diagnoses:  Anemia, unspecified type  Hypokalemia  Right upper quadrant abdominal pain    ED Discharge Orders    None       Virgel Manifold, MD 07/12/19 1510

## 2019-07-12 NOTE — Progress Notes (Signed)
Pt arrived to [facility]. Alert and oriented to self, place and situation. Pt identified appropriately, in mild respiratory distress, but denied SOB at this time, left rib cage pain with deep breaths and left flank pain. Mew[place] 7. Baltazar Najjar, Np notified. Pt placed on PC monitor and CCMD notified.  Pt oriented to room and equipment. Instructed how to use call bell and call bell left within reach. Bed alarm activated.  Will continue to monitor pt and treat per MD orders.

## 2019-07-12 NOTE — H&P (Signed)
History and Physical   [name] [id number] DOB: 1956-03-19 DOA: 07/12/2019  Referring MD/NP/PA: Dr. Vanita Panda  PCP: Charlott Rakes, MD   Outpatient Specialists: None  Patient coming from: Home  Chief Complaint: Right flank pain  HPI: Angel Hebert is a 63 y.o. female with medical history significant of hypertension, GERD, asthma, osteoarthritis, who presents to the ER with right flank pain.  Patient was noticeably very ill and very weak.  Has labored breathing.  No cough.  Denied any sick contacts.  No contact with COVID-19.  Patient reported temperature up to 102 when EMS arrived.  Was hypotensive and severely anemic.  Potassium 2.0.  Patient appears septic on admission and admitted for further work-up.  She has been active until recently.  Husband with the patient said she has had gradual weakness in the last week.  No melena no bright red blood per rectum and no hematemesis.  Patient is being admitted with sepsis and severe anemia.  ED Course: Temperature is 102.3 blood pressure 98/54 pulse 125 respirate 39 oxygen sats 93% on 2 L.  White count is 38.1 hemoglobin 5.2 and platelets of 590.  Sodium 135 potassium 2.0 chloride 101 CO2 20 BUN 20 creatinine drain is 1.85 and calcium 8.7.  Glucose 147.  Lactic acid 1.5.  Fecal occult blood test is negative x2.  CT abdomen and pelvis showed no acute findings, multifocal airspace consolidation throughout lung bases compatible with severe multilobar pneumonia that was subacute compression fracture of the superior endplate of L3 with D34-534 loss of anterior vaginal wall.  Patient being admitted for further work-up.  Review of Systems: As per HPI otherwise 10 point review of systems negative.    Past Medical History:  Diagnosis Date  . Arthritis   . Asthma    last year in December  . Dyspnea   . GERD (gastroesophageal reflux disease)   . High cholesterol   . Hypertension   . Pneumonia 01/2017    Past Surgical History:   Procedure Laterality Date  . ABDOMINAL HYSTERECTOMY    . nasal polyps     removed just this past tuesday at [facility] over by [facility]  . TOTAL KNEE ARTHROPLASTY Left 10/01/2016   Procedure: LEFT TOTAL KNEE ARTHROPLASTY;  Surgeon: Meredith Pel, MD;  Location: [facility];  Service: Orthopedics;  Laterality: Left;     reports that she has never smoked. She has never used smokeless tobacco. She reports that she does not drink alcohol or use drugs.  Allergies  Allergen Reactions  . No Known Allergies     Family History  Problem Relation Age of Onset  . Diabetes Sister   . Colon cancer Neg Hx   . Breast cancer Neg Hx      Prior to Admission medications   Medication Sig Start Date End Date Taking? Authorizing Provider  albuterol (PROVENTIL) (2.5 MG/3ML) 0.083% nebulizer solution Take 3 mLs (2.5 mg total) by nebulization every 6 (six) hours as needed for wheezing or shortness of breath. 10/19/18   Charlott Rakes, MD  ALL DAY ALLERGY 10 MG tablet TAKE 1 TABLET BY MOUTH DAILY. 01/28/17   Charlott Rakes, MD  atorvastatin (LIPITOR) 10 MG tablet Take 1 tablet (10 mg total) by mouth daily at 6 PM. 05/31/19   Charlott Rakes, MD  fluticasone (FLONASE) 50 MCG/ACT nasal spray Place 2 sprays into both nostrils daily. 05/31/19   Charlott Rakes, MD  Fluticasone-Salmeterol (ADVAIR DISKUS) 500-50 MCG/DOSE AEPB INHALE 1 PUFF INTO THE LUNGS EVERY 12 (  TWELVE) HOURS. 05/31/19   Charlott Rakes, MD  gabapentin (NEURONTIN) 300 MG capsule TAKE 1 CAPSULE BY MOUTH 2 TIMES DAILY. 05/31/19   Charlott Rakes, MD  losartan-hydrochlorothiazide (HYZAAR) 100-25 MG tablet Take 1 tablet by mouth daily. 05/31/19   Charlott Rakes, MD  montelukast (SINGULAIR) 10 MG tablet Take 1 tablet (10 mg total) by mouth at bedtime. 05/31/19   Charlott Rakes, MD  naproxen (NAPROSYN) 500 MG tablet TAKE 1 TABLET BY MOUTH 2 TIMES DAILY 12/25/18   Charlott Rakes, MD  pantoprazole (PROTONIX) 20 MG tablet Take 1 tablet (20 mg total) by  mouth daily. 05/31/19   Charlott Rakes, MD  PROAIR HFA 108 (90 Base) MCG/ACT inhaler Inhale 2 puffs into the lungs every 6 (six) hours as needed for wheezing or shortness of breath. 05/31/19   Charlott Rakes, MD  traZODone (DESYREL) 50 MG tablet Take 1 tablet (50 mg total) by mouth at bedtime. 05/31/19   Charlott Rakes, MD    Physical Exam: Vitals:   07/12/19 2256 07/12/19 2300 07/12/19 2304 07/12/19 2315  BP: 118/81     Pulse: (!) 114 (!) 107  (!) 106  Resp: (!) 35 (!) 24  (!) 33  Temp:   (!) 100.9 F (38.3 C)   TempSrc:   Rectal   SpO2: 93% 98%  97%  Weight:      Height:          Constitutional: Confused acutely ill looking and tachypneic Vitals:   07/12/19 2256 07/12/19 2300 07/12/19 2304 07/12/19 2315  BP: 118/81     Pulse: (!) 114 (!) 107  (!) 106  Resp: (!) 35 (!) 24  (!) 33  Temp:   (!) 100.9 F (38.3 C)   TempSrc:   Rectal   SpO2: 93% 98%  97%  Weight:      Height:       Eyes: PERRL, lids and conjunctivae normal ENMT: Mucous membranes are moist. Posterior pharynx clear of any exudate or lesions.Normal dentition.  Neck: normal, supple, no masses, no thyromegaly Respiratory: Decreased air entry bilaterally with some crackles coarse breath sounds.  Mild wheezing normal respiratory effort. No accessory muscle use.  Cardiovascular: Sinus tachycardia, no murmurs / rubs / gallops. No extremity edema. 2+ pedal pulses. No carotid bruits.  Abdomen: no tenderness, no masses palpated. No hepatosplenomegaly. Bowel sounds positive.  Musculoskeletal: no clubbing / cyanosis. No joint deformity upper and lower extremities. Good ROM, no contractures. Normal muscle tone.  Skin: no rashes, lesions, ulcers. No induration Neurologic: CN 2-12 grossly intact. Sensation intact, DTR normal. Strength 5/5 in all 4.  Psychiatric: Confused, debilitated.     Labs on Admission: I have personally reviewed following labs and imaging studies  CBC: Recent Labs  Lab 07/12/19 1326  WBC 38.1*   NEUTROABS 36.6*  HGB 5.2*  HCT 17.3*  MCV 73.3*  PLT [id number]*   Basic Metabolic Panel: Recent Labs  Lab 07/12/19 1323 07/12/19 1326  NA  --  135  K  --  2.0*  CL  --  101  CO2  --  20*  GLUCOSE  --  147*  BUN  --  20  CREATININE  --  0.85  CALCIUM  --  8.7*  MG 2.0  --    GFR: Estimated Creatinine Clearance: 66.7 mL/min (by C-G formula based on SCr of 0.85 mg/dL). Liver Function Tests: Recent Labs  Lab 07/12/19 1326  AST 70*  ALT 21  ALKPHOS 83  BILITOT 1.9*  PROT 7.6  ALBUMIN  2.3*   Recent Labs  Lab 07/12/19 1326  LIPASE 286*   No results for input(s): AMMONIA in the last 168 hours. Coagulation Profile: No results for input(s): INR, PROTIME in the last 168 hours. Cardiac Enzymes: No results for input(s): CKTOTAL, CKMB, CKMBINDEX, TROPONINI in the last 168 hours. BNP (last 3 results) No results for input(s): PROBNP in the last 8760 hours. HbA1C: No results for input(s): HGBA1C in the last 72 hours. CBG: No results for input(s): GLUCAP in the last 168 hours. Lipid Profile: No results for input(s): CHOL, HDL, LDLCALC, TRIG, CHOLHDL, LDLDIRECT in the last 72 hours. Thyroid Function Tests: No results for input(s): TSH, T4TOTAL, FREET4, T3FREE, THYROIDAB in the last 72 hours. Anemia Panel: No results for input(s): VITAMINB12, FOLATE, FERRITIN, TIBC, IRON, RETICCTPCT in the last 72 hours. Urine analysis:    Component Value Date/Time   COLORURINE STRAW (A) 02/04/2017 1214   APPEARANCEUR CLEAR 02/04/2017 1214   LABSPEC 1.002 (L) 02/04/2017 1214   PHURINE 6.0 02/04/2017 1214   GLUCOSEU NEGATIVE 02/04/2017 1214   HGBUR LARGE (A) 02/04/2017 1214   BILIRUBINUR NEGATIVE 02/04/2017 1214   KETONESUR 20 (A) 02/04/2017 1214   PROTEINUR NEGATIVE 02/04/2017 1214   UROBILINOGEN 0.2 10/08/2012 1224   NITRITE NEGATIVE 02/04/2017 1214   LEUKOCYTESUR NEGATIVE 02/04/2017 1214   Sepsis Labs: @LABRCNTIP (procalcitonin:4,lacticidven:4) ) Recent Results (from the past 240  hour(s))  SARS CORONAVIRUS 2 (TAT 6-24 HRS) Nasopharyngeal Nasopharyngeal Swab     Status: None   Collection Time: 07/12/19  1:25 PM   Specimen: Nasopharyngeal Swab  Result Value Ref Range Status   SARS Coronavirus 2 NEGATIVE NEGATIVE Final    Comment: (NOTE) SARS-CoV-2 target nucleic acids are NOT DETECTED. The SARS-CoV-2 RNA is generally detectable in upper and lower respiratory specimens during the acute phase of infection. Negative results do not preclude SARS-CoV-2 infection, do not rule out co-infections with other pathogens, and should not be used as the sole basis for treatment or other patient management decisions. Negative results must be combined with clinical observations, patient history, and epidemiological information. The expected result is Negative. Fact Sheet for Patients: SugarRoll.be Fact Sheet for Healthcare Providers: https://www.woods-mathews.com/ This test is not yet approved or cleared by the [place] FDA and  has been authorized for detection and/or diagnosis of SARS-CoV-2 by FDA under an Emergency Use Authorization (EUA). This EUA will remain  in effect (meaning this test can be used) for the duration of the COVID-19 declaration under Section 56 4(b)(1) of the Act, 21 U.S.C. section 360bbb-3(b)(1), unless the authorization is terminated or revoked sooner. Performed at [facility], [place] [address], [place], [place] [address]      Radiological Exams on Admission: Ct Abdomen Pelvis W Contrast  Result Date: 07/12/2019 CLINICAL DATA:  62 year old female with history of abdominal pain. Suspected diverticulitis. History of fever. EXAM: CT ABDOMEN AND PELVIS WITH CONTRAST TECHNIQUE: Multidetector CT imaging of the abdomen and pelvis was performed using the standard protocol following bolus administration of intravenous contrast. CONTRAST:  125mL OMNIPAQUE IOHEXOL 300 MG/ML  SOLN COMPARISON:  No priors. FINDINGS:  Lower chest: Extensive airspace consolidation in the left lower lobe, as well as additional areas of airspace consolidation in the right lower lobe, right middle lobe and inferior segment of the lingula. Atherosclerotic calcifications in the left anterior descending and right coronary arteries. Hepatobiliary: No suspicious cystic or solid hepatic lesions. No intra or extrahepatic biliary ductal dilatation. Gallbladder is normal in appearance. Pancreas: No pancreatic mass. No pancreatic ductal  dilatation. No pancreatic or peripancreatic fluid collections or inflammatory changes. Spleen: Unremarkable. Adrenals/Urinary Tract: Bilateral kidneys and bilateral adrenal glands are normal in appearance. No hydroureteronephrosis. Urinary bladder is normal in appearance. Stomach/Bowel: Normal appearance of the stomach. No pathologic dilatation of small bowel or colon. The appendix is not confidently identified and may be surgically absent. Regardless, there are no inflammatory changes noted adjacent to the cecum to suggest the presence of an acute appendicitis at this time. Vascular/Lymphatic: Aortic atherosclerosis, without evidence of aneurysm or dissection in the abdominal or pelvic vasculature. No lymphadenopathy noted in the abdomen or pelvis. Reproductive: Status post hysterectomy. Ovaries are unremarkable in appearance. Other: No significant volume of ascites.  No pneumoperitoneum. Musculoskeletal: Compression fracture of superior endplate of L3 with D34-534 loss of anterior vertebral body height, with clearly visible fracture lines anteriorly, but no surrounding soft tissue swelling indicative of a subacute fracture. There are no aggressive appearing lytic or blastic lesions noted in the visualized portions of the skeleton. IMPRESSION: 1. No acute findings are noted in the abdomen or pelvis to account for the patient's symptoms. Specifically, no evidence of acute diverticulitis. 2. However, there is multifocal airspace  consolidation throughout the visualize lung bases, compatible with severe multilobar pneumonia. This likely accounts for the patient's history of fever. 3. Subacute compression fracture of superior endplate of L3 with D34-534 loss of anterior vertebral body height. 4. Aortic atherosclerosis, in addition to least 2 vessel coronary artery disease. Please note that although the presence of coronary artery calcium documents the presence of coronary artery disease, the severity of this disease and any potential stenosis cannot be assessed on this non-gated CT examination. Assessment for potential risk factor modification, dietary therapy or pharmacologic therapy may be warranted, if clinically indicated. Electronically Signed   By: Vinnie Langton M.D.   On: 07/12/2019 15:44   Dg Chest Port 1 View  Result Date: 07/12/2019 CLINICAL DATA:  Hypoxia EXAM: PORTABLE CHEST 1 VIEW COMPARISON:  07/12/2011 FINDINGS: Cardiac shadow is stable. Aortic calcifications are again seen. Bibasilar infiltrates are noted worsening from the prior exam. They appear increased when compared with the most recent CT of the abdomen and pelvis as well. Skin fold is noted over the upper right chest. No pneumothorax is seen. No definitive bony abnormality is noted. IMPRESSION: Increasing bibasilar infiltrates. Electronically Signed   By: Inez Catalina M.D.   On: 07/12/2019 19:14   Dg Chest Portable 1 View  Result Date: 07/12/2019 CLINICAL DATA:  Right-sided chest pain and shortness of breath for 1 day. EXAM: PORTABLE CHEST 1 VIEW COMPARISON:  Chest x-ray 02/07/2017 FINDINGS: The heart is upper limits of normal in size. There is mild tortuosity and calcification of the thoracic aorta. There are patchy bibasilar infiltrates. No pleural effusions. No pulmonary edema. The bony thorax is intact. IMPRESSION: Bibasilar infiltrates. Electronically Signed   By: Marijo Sanes M.D.   On: 07/12/2019 13:39    EKG: Independently reviewed.  Shows sinus  tachycardia with a rate of 108.  Borderline prolonged QTC no significant findings  Assessment/Plan Principal Problem:   Symptomatic anemia Active Problems:   Hyperlipidemia   Essential hypertension   GERD   Sepsis ([place])   Hypokalemia   Lobar pneumonia ([place])     #1 symptomatic anemia: Patient is tachycardic weak and has evidence of fluid overload.  Hemoglobin 5.2.  Transfuse initial 2 units of packed red blood cells.  Recheck level monitor in the morning.  Does not appear to have GI bleed.  Could  be nutritional versus anemia of chronic disease.  We will monitor H&H if it is dropping or further occult blood is positive then will get GI consult.  #2 sepsis: Some elements of hypotension.  Most likely secondary to pneumonia.  Continue antibiotics  #3 pneumonia: Has not been in the hospital in the last 3 months.  Still septic with multiple comorbidities.  Initiate Vanco and cefepime for now.  Monitor blood cultures.  #4 hypokalemia: Potassium is 2.0.  Received runs of potassium in the ER.  We will recheck level in the morning and continue to replete if needed.  Check magnesium with morning labs.  #5 GERD: Continue with PPIs.  #6 hyperlipidemia: Continue statin  #7 hypertension: Blood pressure is low in the moment.  Treat when patient is more stable and blood pressure is rising  #8 elevated lipase: Lipase of 286.  No evidence of pancreatitis.  Hydrate patient on treat symptomatically   DVT prophylaxis: SCD Code Status: DNR Family Communication: Husband at bedside Disposition Plan: Home Consults called: None Admission status: Inpatient to progressive care  Severity of Illness: The appropriate patient status for this patient is INPATIENT. Inpatient status is judged to be reasonable and necessary in order to provide the required intensity of service to ensure the patient's safety. The patient's presenting symptoms, physical exam findings, and initial radiographic and laboratory data in  the context of their chronic comorbidities is felt to place them at high risk for further clinical deterioration. Furthermore, it is not anticipated that the patient will be medically stable for discharge from the hospital within 2 midnights of admission. The following factors support the patient status of inpatient.   " The patient's presenting symptoms include right flank pain. " The worrisome physical exam findings include altered mental status and weakness. " The initial radiographic and laboratory data are worrisome because of chest x-ray showing pneumonia. " The chronic co-morbidities include hypertension.   * I certify that at the point of admission it is my clinical judgment that the patient will require inpatient hospital care spanning beyond 2 midnights from the point of admission due to high intensity of service, high risk for further deterioration and high frequency of surveillance required.Barbette Merino MD [facility] Pager [phone]  If 7PM-7AM, please contact night-coverage [email or web address] Password [facility]  07/12/2019, 11:38 PM

## 2019-07-12 NOTE — Progress Notes (Signed)
Baltazar Najjar, NP at the bedside, assessing pt.

## 2019-07-12 NOTE — ED Notes (Signed)
This RN in room with pt, pt's spo2 noted to be at 85% on RA and pt tachypneic at 45 RR/min. Pt alert but lethargic. Placed on 3L [place], spo2 up to 96%. Primary RN notified.

## 2019-07-12 NOTE — Progress Notes (Signed)
Pt condition improving. Pt still has fever 100.9 Tylenol suppository 325 given as ordered. Respiratory rate elevated, but pt does not seem to be in distress at this time, pt asleep. Will administer unit of blood once temp 100.0 or lower per Baltazar Najjar, NP. Will continue to monitor and treat pt.

## 2019-07-12 NOTE — ED Triage Notes (Signed)
Pt bib ems from home with complaints of sob R flank pain. Pt husband provided most hx. Pt found to be 88% RA and have a temp of 102 with EMS. 120/70, HR 106 ST, RR 36. Pt incontinent of urine. 250cc fluid given by ems.

## 2019-07-12 NOTE — Progress Notes (Addendum)
Will hold blood administration until temperature 100 or lower per NP.

## 2019-07-12 NOTE — ED Notes (Signed)
Pt work of breathing has increased, pt now grunting while breathing. Pt now requiring 3L N to maintain saturations 94% and above. EDP made aware. Repeat CXR ordered.

## 2019-07-12 NOTE — Progress Notes (Addendum)
RN paged because pt's MEWS score is 7. NP to bedside.  S: Pt states that she has no hx of CHF, MI, or COPD. She is not on O2 at home. She states she came to the hospital because of her right flank pain. States she hasn't felt well for 2 days and hasn't had much to eat or drink at home. She denies chest pain. Pain is located in right flank area and is sharp when she takes a deep breath. It is localized to the right flank. She denies n/v. No abdominal pain. She states she was SOB earlier but it is not present now.  O: T 102.3. HR 113. RR 29. SaO2 100% on 3L per [place]. BP 130/84. Appears fairly well, acute on chronically ill female in NAD. She is alert/oriented. Oral mucosa is moist. Card: S1S2 with tachycardia and PVCs on tele. Resp: Congestion throughout and diminished at the bases. She is tachypneic, but is not using her accessory muscles at present. Her abdomen is distended. BS hypoactive in upper quadrants and normal in lower. No discomfort expressed on palpation. No rebound. LEs without edema. Skin is dry with slight tenting.  A/P: 1. PNA-CXR worsened since arrival. Now, CXR states multilobar PNA. On Vanc and Cefepime. Fever-has already had blood cx. Urine pending. First LA 1.8, 2nd one pending. CBC with very elevated WBCC. Recheck in am.  2. Acute respiratory distress, mild at present. No evidence of moderate to severe distress at present. She is alert/oriented. She is satting normally on 3L. Will need to watch RR and O2 sats carefully. Continue supportive care. Continue progressive care status for now.  3. Hypokalemia-is on IVF with KCL at 100cc/hr. Received 60meq Kdur in ED. Has 4 runs of K ordered now. Recheck a.m. Likely cause of PVCs. Mg was 2.0.  4. Right flank pain-pleuritic in nature. Kpad and Tylenol.  5. MEWS 7-MEWS protocol in place. This is due to tachypnea, fever, tachycardia. Will initiate above care and follow to decreased score.  6. Anemia-Hgb 5.2 on admission. Received one unit PRBCs in ED.  Needs 2nd one. Will hold on starting until temp less than 100. No active bleeding noted.  Discussed plan with RN at bedside.  Further care of 30 mins needed to assess pt with high MEWS score.  KJKG, NP [facility] Update: Temp down. RR improved. HR down to low 100s. Giving 325mg  Tylenol in addition to previous 650mg . Spoke to RN-when temp 100 degrees or less, can hand 2nd unit of blood. MEWS down to 3 now.  KJKG, NP [facility]

## 2019-07-12 NOTE — Progress Notes (Signed)
Pharmacy Antibiotic Note  Angel Hebert is a 63 y.o. female admitted on 07/12/2019 with sepsis. Pt admitted with fevers and flank pain. Pharmacy has been consulted for vancomycin and cefepime dosing.  Plan: -Cefepime 2g IV q8h -Vancomycin 1500mg  IV q24h - est AUC 528 -Follow cultures, LOT, renal function, and ability to de-escalate -Vancomycin levels as needed      Temp (24hrs), Avg:99.5 F (37.5 C), Min:98.1 F (36.7 C), Max:102.3 F (39.1 C)  Recent Labs  Lab 07/12/19 1326 07/12/19 1524  WBC 38.1*  --   CREATININE 0.85  --   LATICACIDVEN  --  1.8    CrCl cannot be calculated (Unknown ideal weight.).    Allergies  Allergen Reactions  . No Known Allergies     Antimicrobials this admission: Vancomycin 11/9 >>  Cefepime 11/9 >>  Metronidazole 11/9 x1   Microbiology results: 11/9 BCx: sent  Thank you for allowing pharmacy to be a part of this patient's care.  Arrie Senate, PharmD, BCPS Clinical Pharmacist [phone] Please check AMION for all [place] numbers 07/12/2019

## 2019-07-13 ENCOUNTER — Inpatient Hospital Stay (HOSPITAL_COMMUNITY): Payer: Medicaid Other

## 2019-07-13 ENCOUNTER — Other Ambulatory Visit (HOSPITAL_COMMUNITY): Payer: Medicaid Other

## 2019-07-13 DIAGNOSIS — R918 Other nonspecific abnormal finding of lung field: Secondary | ICD-10-CM

## 2019-07-13 DIAGNOSIS — I340 Nonrheumatic mitral (valve) insufficiency: Secondary | ICD-10-CM

## 2019-07-13 DIAGNOSIS — R0902 Hypoxemia: Secondary | ICD-10-CM

## 2019-07-13 DIAGNOSIS — E876 Hypokalemia: Secondary | ICD-10-CM

## 2019-07-13 DIAGNOSIS — A419 Sepsis, unspecified organism: Secondary | ICD-10-CM

## 2019-07-13 LAB — COMPREHENSIVE METABOLIC PANEL
ALT: 18 U/L (ref 0–44)
AST: 47 U/L — ABNORMAL HIGH (ref 15–41)
Albumin: 1.9 g/dL — ABNORMAL LOW (ref 3.5–5.0)
Alkaline Phosphatase: 79 U/L (ref 38–126)
Anion gap: 12 (ref 5–15)
BUN: 14 mg/dL (ref 8–23)
CO2: 20 mmol/L — ABNORMAL LOW (ref 22–32)
Calcium: 8.4 mg/dL — ABNORMAL LOW (ref 8.9–10.3)
Chloride: 114 mmol/L — ABNORMAL HIGH (ref 98–111)
Creatinine, Ser: 0.49 mg/dL (ref 0.44–1.00)
GFR calc Af Amer: >60 mL/min (ref >60)
GFR calc non Af Amer: >60 mL/min (ref >60)
Glucose, Bld: 120 mg/dL — ABNORMAL HIGH (ref 70–99)
Potassium: 2.9 mmol/L — ABNORMAL LOW (ref 3.5–5.1)
Sodium: 146 mmol/L — ABNORMAL HIGH (ref 135–145)
Total Bilirubin: 3.1 mg/dL — ABNORMAL HIGH (ref 0.3–1.2)
Total Protein: 6.9 g/dL (ref 6.5–8.1)

## 2019-07-13 LAB — MAGNESIUM: Magnesium: 1.9 mg/dL (ref 1.7–2.4)

## 2019-07-13 LAB — BASIC METABOLIC PANEL
Anion gap: 10 (ref 5–15)
BUN: 14 mg/dL (ref 8–23)
CO2: 20 mmol/L — ABNORMAL LOW (ref 22–32)
Calcium: 8.2 mg/dL — ABNORMAL LOW (ref 8.9–10.3)
Chloride: 112 mmol/L — ABNORMAL HIGH (ref 98–111)
Creatinine, Ser: 0.7 mg/dL (ref 0.44–1.00)
GFR calc Af Amer: >60 mL/min (ref >60)
GFR calc non Af Amer: >60 mL/min (ref >60)
Glucose, Bld: 122 mg/dL — ABNORMAL HIGH (ref 70–99)
Potassium: 2.9 mmol/L — ABNORMAL LOW (ref 3.5–5.1)
Sodium: 142 mmol/L (ref 135–145)

## 2019-07-13 LAB — STREP PNEUMONIAE URINARY ANTIGEN: Strep Pneumo Urinary Antigen: POSITIVE — AB

## 2019-07-13 LAB — BLOOD GAS, ARTERIAL
Acid-base deficit: 2.6 mmol/L — ABNORMAL HIGH (ref 0.0–2.0)
Bicarbonate: 20.8 mmol/L (ref 20.0–28.0)
Drawn by: 236041
O2 Content: 2 L/min
O2 Saturation: 92.8 %
Patient temperature: 37
pCO2 arterial: 30.7 mmHg — ABNORMAL LOW (ref 32.0–48.0)
pH, Arterial: 7.447 (ref 7.350–7.450)
pO2, Arterial: 65.8 mmHg — ABNORMAL LOW (ref 83.0–108.0)

## 2019-07-13 LAB — URINALYSIS, ROUTINE W REFLEX MICROSCOPIC
Glucose, UA: NEGATIVE mg/dL
Ketones, ur: NEGATIVE mg/dL
Leukocytes,Ua: NEGATIVE
Nitrite: POSITIVE — AB
Protein, ur: 30 mg/dL — AB
Specific Gravity, Urine: 1.01 (ref 1.005–1.030)
pH: 6.5 (ref 5.0–8.0)

## 2019-07-13 LAB — CBC WITH DIFFERENTIAL/PLATELET
Abs Immature Granulocytes: 0.61 10*3/uL — ABNORMAL HIGH (ref 0.00–0.07)
Basophils Absolute: 0.1 10*3/uL (ref 0.0–0.1)
Basophils Relative: 0 %
Eosinophils Absolute: 0 10*3/uL (ref 0.0–0.5)
Eosinophils Relative: 0 %
HCT: 25.9 % — ABNORMAL LOW (ref 36.0–46.0)
Hemoglobin: 8.3 g/dL — ABNORMAL LOW (ref 12.0–15.0)
Immature Granulocytes: 2 %
Lymphocytes Relative: 5 %
Lymphs Abs: 1.6 10*3/uL (ref 0.7–4.0)
MCH: 24.2 pg — ABNORMAL LOW (ref 26.0–34.0)
MCHC: 32 g/dL (ref 30.0–36.0)
MCV: 75.5 fL — ABNORMAL LOW (ref 80.0–100.0)
Monocytes Absolute: 1.6 10*3/uL — ABNORMAL HIGH (ref 0.1–1.0)
Monocytes Relative: 5 %
Neutro Abs: 25.8 10*3/uL — ABNORMAL HIGH (ref 1.7–7.7)
Neutrophils Relative %: 88 %
Platelets: 620 10*3/uL — ABNORMAL HIGH (ref 150–400)
RBC: 3.43 MIL/uL — ABNORMAL LOW (ref 3.87–5.11)
RDW: 24.8 % — ABNORMAL HIGH (ref 11.5–15.5)
WBC: 29.6 10*3/uL — ABNORMAL HIGH (ref 4.0–10.5)
nRBC: 0.4 % — ABNORMAL HIGH (ref 0.0–0.2)

## 2019-07-13 LAB — URINALYSIS, MICROSCOPIC (REFLEX)

## 2019-07-13 LAB — MRSA PCR SCREENING: MRSA by PCR: NEGATIVE

## 2019-07-13 LAB — LACTATE DEHYDROGENASE: LDH: 197 U/L — ABNORMAL HIGH (ref 98–192)

## 2019-07-13 LAB — FIBRINOGEN: Fibrinogen: >800 mg/dL — ABNORMAL HIGH (ref 210–475)

## 2019-07-13 LAB — ECHOCARDIOGRAM COMPLETE
Height: 67 in
Weight: 2349.22 oz

## 2019-07-13 LAB — PROCALCITONIN: Procalcitonin: 4.82 ng/mL

## 2019-07-13 LAB — C-REACTIVE PROTEIN: CRP: 31.2 mg/dL — ABNORMAL HIGH (ref <1.0)

## 2019-07-13 LAB — FERRITIN: Ferritin: 189 ng/mL (ref 11–307)

## 2019-07-13 LAB — D-DIMER, QUANTITATIVE: D-Dimer, Quant: 9.69 ug/mL-FEU — ABNORMAL HIGH (ref 0.00–0.50)

## 2019-07-13 MED ORDER — FUROSEMIDE 10 MG/ML IJ SOLN
60.0000 mg | Freq: Once | INTRAMUSCULAR | Status: AC
  Administered 2019-07-13: 60 mg via INTRAVENOUS
  Filled 2019-07-13: qty 6

## 2019-07-13 MED ORDER — ORAL CARE MOUTH RINSE
15.0000 mL | Freq: Two times a day (BID) | OROMUCOSAL | Status: DC
  Administered 2019-07-13 – 2019-07-21 (×10): 15 mL via OROMUCOSAL

## 2019-07-13 MED ORDER — IOHEXOL 350 MG/ML SOLN
100.0000 mL | Freq: Once | INTRAVENOUS | Status: AC | PRN
  Administered 2019-07-13: 100 mL via INTRAVENOUS

## 2019-07-13 MED ORDER — MONTELUKAST SODIUM 10 MG PO TABS
10.0000 mg | ORAL_TABLET | Freq: Every day | ORAL | Status: DC
  Administered 2019-07-13 – 2019-07-20 (×8): 10 mg via ORAL
  Filled 2019-07-13 (×8): qty 1

## 2019-07-13 MED ORDER — IPRATROPIUM-ALBUTEROL 0.5-2.5 (3) MG/3ML IN SOLN
3.0000 mL | RESPIRATORY_TRACT | Status: DC | PRN
  Administered 2019-07-13 – 2019-07-15 (×3): 3 mL via RESPIRATORY_TRACT
  Filled 2019-07-13 (×3): qty 3

## 2019-07-13 MED ORDER — INFLUENZA VAC SPLIT QUAD 0.5 ML IM SUSY
0.5000 mL | PREFILLED_SYRINGE | INTRAMUSCULAR | Status: AC
  Administered 2019-07-17: 0.5 mL via INTRAMUSCULAR
  Filled 2019-07-13: qty 0.5

## 2019-07-13 MED ORDER — MOMETASONE FURO-FORMOTEROL FUM 200-5 MCG/ACT IN AERO
2.0000 | INHALATION_SPRAY | Freq: Two times a day (BID) | RESPIRATORY_TRACT | Status: DC
  Administered 2019-07-13 – 2019-07-20 (×14): 2 via RESPIRATORY_TRACT
  Filled 2019-07-13: qty 8.8

## 2019-07-13 NOTE — Progress Notes (Signed)
  Echocardiogram 2D Echocardiogram has been performed.  Latifa Noble G Seann Genther 07/13/2019, 5:12 PM

## 2019-07-13 NOTE — Progress Notes (Signed)
Pt RR still elevated, no signs of distress. Pt lethargic but arousable. Will continue to monitor pt.

## 2019-07-13 NOTE — Progress Notes (Signed)
   07/13/19 1948  Vitals  Pulse Rate 95  ECG Heart Rate 98  Resp (!) 30  Oxygen Therapy  SpO2 95 %  MEWS Score  MEWS RR 2  MEWS Pulse 0  MEWS Systolic 0  MEWS LOC 0  MEWS Temp 0  MEWS Score 2  MEWS Score Color Yellow  MEWS Assessment  Is this an acute change? No  Not an acute change, elevate RR already addressed by dayshift RN. Will continue to monitor closely.

## 2019-07-13 NOTE — Progress Notes (Signed)
PHARMACY - PHYSICIAN COMMUNICATION CRITICAL VALUE ALERT - BLOOD CULTURE IDENTIFICATION (BCID)  Angel Hebert is an 63 y.o. female who presented to [facility] on 07/12/2019 with a chief complaint of R-flank pain  Assessment: 72 YOF on antibiotics for rule out sepsis of possible PNA source now with 1 of 4 blood cultures growing GPC in chains. BCID will not be run since only 1 set positive currently. Could represent a Streptococcus species though will need to wait on micro to speciate.  Name of physician (or Provider) ContactedStarla Link  Current antibiotics: Vancomycin + Cefepime  Changes to prescribed antibiotics recommended:  The physician was engaged in a conversation on narrowing antibiotics now or waiting for the culture to speciate - Dr. Starla Link prefers to wait for more information which is reasonable. Continuing on Vancomycin + Cefepime. Hopefully will have more results on 11/11 and can narrow at that time.   Thank you for allowing pharmacy to be a part of this patient's care.  Alycia Rossetti, PharmD, BCPS Clinical Pharmacist Clinical phone for 07/13/2019: [phone] 07/13/2019 11:13 AM   **Pharmacist phone directory can now be found on [email or web address] ([email or web address]).  Listed under [place].

## 2019-07-13 NOTE — Progress Notes (Signed)
Patient ID: Esthefany Hollender, female   DOB: [birth year], 63 y.o.   MRN: [id number]  PROGRESS NOTE    Daelin Cichy  [id number] DOB: [birth year] DOA: 07/12/2019 PCP: Charlott Rakes, MD   Brief Narrative:  63 year old female with history of hypertension, GERD, asthma, osteoarthritis presented with right flank pain on 07/12/2019.  She was found to be hypotensive with leukocytosis and hemoglobin of 5.2.  CT abdomen and pelvis showed no acute findings but showed multifocal airspace consolidation throughout lung bases compatible with severe multilobar pneumonia along with subacute compression fracture of the superior endplate of the L3.  She was started on IV antibiotics and transfused 2 units packed red cells.  Assessment & Plan:   Symptomatic anemia -Presented with hemoglobin of 5.2.  No signs of overt blood loss including hematemesis or melena.  Occult blood negative.  Prior hemoglobin in epic records in February 2019 was above 12. -Status post 2 units packed red cells transfusion.  Hemoglobin 8.3 today. -Might need GI evaluation at some point, may be as an outpatient as current respiratory status would not allow for endoscopy evaluation. -Monitor H&H  Sepsis: Present on admission -Probably from pneumonia.  Continue antibiotics.  Follow cultures -Hemodynamically stable.  DC IV fluids.  Probable community-acquired multifocal pneumonia Hypoxia -Currently on broad-spectrum antibiotics.  Follow cultures.  Urine Legionella and streptococcal antigen.  COVID-19 testing negative. -We will check procalcitonin and other inflammatory markers.  We will also check CT of the chest with contrast to rule out PE as well. -Currently on 2 L oxygen via nasal cannula.  Wean off as able.  Incentive spirometry.  SLP evaluation - will give 1 dose of intravenous Lasix as patient received 2 units of packed red cells and also has been receiving IV fluids.  Leukocytosis -From above.  Improving.   Monitor  Thrombocytosis -Most likely reactive.  Monitor  Hypoalbuminemia -Probably from poor oral intake.  Nutrition consult.  GERD -Continue PPI  Hypokalemia -Replace.  Repeat a.m. labs  Hypertension -Monitor blood pressure.  Restart antihypertensives if blood pressure remains stable by tomorrow.  Generalized deconditioning -We will need PT eval   DVT prophylaxis: SCDs Code Status: DNR Family Communication: Spoke to patient at bedside Disposition Plan: Depends on clinical outcome  Consultants: None  Procedures: None  Antimicrobials:  Vancomycin and cefepime from 07/12/2019 onwards  Subjective: Patient seen and examined at bedside.  She still feels weak and tired but feels slightly better.  Denied any recent black or bloody stools.  No worsening cough.  Denies any abdominal pain.  Objective: Vitals:   07/13/19 0412 07/13/19 0452 07/13/19 0500 07/13/19 0525  BP:  135/86  118/75  Pulse:  95 96 92  Resp:  (!) 28 (!) 25   Temp: 98.2 F (36.8 C) 98.2 F (36.8 C)    TempSrc: Oral Oral    SpO2: 98% 100% 100%   Weight:      Height:        Intake/Output Summary (Last 24 hours) at 07/13/2019 0956 Last data filed at 07/13/2019 0809 Gross per 24 hour  Intake 1173.28 ml  Output 240 ml  Net 933.28 ml   Filed Weights   07/12/19 2120  Weight: 66.6 kg    Examination:  General exam: Appears calm and comfortable.  Looks older than stated age. Respiratory system: Bilateral decreased breath sounds at bases with no wheezing intermittent tachypnea Cardiovascular system: S1 & S2 heard, Rate controlled Gastrointestinal system: Abdomen is nondistended, soft and nontender. Normal bowel sounds heard. Extremities: No  cyanosis, clubbing; trace edema Central nervous system: Alert and oriented. No focal neurological deficits. Moving extremities Skin: No rashes, lesions or ulcers Psychiatry: Flat affect.   Data Reviewed: I have personally reviewed following labs and imaging  studies  CBC: Recent Labs  Lab 07/12/19 1326 07/13/19 0806  WBC 38.1* 29.6*  NEUTROABS 36.6* 25.8*  HGB 5.2* 8.3*  HCT 17.3* 25.9*  MCV 73.3* 75.5*  PLT 590* 0000000*   Basic Metabolic Panel: Recent Labs  Lab 07/12/19 1323 07/12/19 1326 07/13/19 0636 07/13/19 0806  NA  --  135 142 146*  K  --  2.0* 2.9* 2.9*  CL  --  101 112* 114*  CO2  --  20* 20* 20*  GLUCOSE  --  147* 122* 120*  BUN  --  20 14 14   CREATININE  --  0.85 0.70 0.49  CALCIUM  --  8.7* 8.2* 8.4*  MG 2.0  --   --  1.9   GFR: Estimated Creatinine Clearance: 70.9 mL/min (by C-G formula based on SCr of 0.49 mg/dL). Liver Function Tests: Recent Labs  Lab 07/12/19 1326 07/13/19 0806  AST 70* 47*  ALT 21 18  ALKPHOS 83 79  BILITOT 1.9* 3.1*  PROT 7.6 6.9  ALBUMIN 2.3* 1.9*   Recent Labs  Lab 07/12/19 1326  LIPASE 286*   No results for input(s): AMMONIA in the last 168 hours. Coagulation Profile: No results for input(s): INR, PROTIME in the last 168 hours. Cardiac Enzymes: No results for input(s): CKTOTAL, CKMB, CKMBINDEX, TROPONINI in the last 168 hours. BNP (last 3 results) No results for input(s): PROBNP in the last 8760 hours. HbA1C: No results for input(s): HGBA1C in the last 72 hours. CBG: No results for input(s): GLUCAP in the last 168 hours. Lipid Profile: No results for input(s): CHOL, HDL, LDLCALC, TRIG, CHOLHDL, LDLDIRECT in the last 72 hours. Thyroid Function Tests: No results for input(s): TSH, T4TOTAL, FREET4, T3FREE, THYROIDAB in the last 72 hours. Anemia Panel: Recent Labs    07/13/19 0806  FERRITIN 189   Sepsis Labs: Recent Labs  Lab 07/12/19 1524 07/12/19 2114 07/13/19 0806  PROCALCITON  --   --  4.82  LATICACIDVEN 1.8 1.5  --     Recent Results (from the past 240 hour(s))  SARS CORONAVIRUS 2 (TAT 6-24 HRS) Nasopharyngeal Nasopharyngeal Swab     Status: None   Collection Time: 07/12/19  1:25 PM   Specimen: Nasopharyngeal Swab  Result Value Ref Range Status    SARS Coronavirus 2 NEGATIVE NEGATIVE Final    Comment: (NOTE) SARS-CoV-2 target nucleic acids are NOT DETECTED. The SARS-CoV-2 RNA is generally detectable in upper and lower respiratory specimens during the acute phase of infection. Negative results do not preclude SARS-CoV-2 infection, do not rule out co-infections with other pathogens, and should not be used as the sole basis for treatment or other patient management decisions. Negative results must be combined with clinical observations, patient history, and epidemiological information. The expected result is Negative. Fact Sheet for Patients: SugarRoll.be Fact Sheet for Healthcare Providers: https://www.woods-mathews.com/ This test is not yet approved or cleared by the [place] FDA and  has been authorized for detection and/or diagnosis of SARS-CoV-2 by FDA under an Emergency Use Authorization (EUA). This EUA will remain  in effect (meaning this test can be used) for the duration of the COVID-19 declaration under Section 56 4(b)(1) of the Act, 21 U.S.C. section 360bbb-3(b)(1), unless the authorization is terminated or revoked sooner. Performed at [facility],  [address], [place], [place] [address]   Blood culture (routine x 2)     Status: None (Preliminary result)   Collection Time: 07/12/19  1:30 PM   Specimen: BLOOD  Result Value Ref Range Status   Specimen Description BLOOD LEFT ANTECUBITAL  Final   Special Requests   Final    BOTTLES DRAWN AEROBIC AND ANAEROBIC Blood Culture adequate volume   Culture   Final    NO GROWTH < 24 HOURS Performed at [facility], [place] [address], [place], [place] [address]    Report Status PENDING  Incomplete  Blood culture (routine x 2)     Status: None (Preliminary result)   Collection Time: 07/12/19  1:30 PM   Specimen: BLOOD  Result Value Ref Range Status   Specimen Description BLOOD RIGHT ANTECUBITAL  Final   Special  Requests   Final    BOTTLES DRAWN AEROBIC AND ANAEROBIC Blood Culture adequate volume   Culture   Final    NO GROWTH < 24 HOURS Performed at [facility], [place] [address] [place], [place] [address]    Report Status PENDING  Incomplete  MRSA PCR Screening     Status: None   Collection Time: 07/12/19 11:48 PM   Specimen: Nasal Mucosa; Nasopharyngeal  Result Value Ref Range Status   MRSA by PCR NEGATIVE NEGATIVE Final    Comment:        The GeneXpert MRSA Assay (FDA approved for NASAL specimens only), is one component of a comprehensive MRSA colonization surveillance program. It is not intended to diagnose MRSA infection nor to guide or monitor treatment for MRSA infections. Performed at [facility], [place] [address] [place], [place] [address]          Radiology Studies: Ct Abdomen Pelvis W Contrast  Result Date: 07/12/2019 CLINICAL DATA:  62 year old female with history of abdominal pain. Suspected diverticulitis. History of fever. EXAM: CT ABDOMEN AND PELVIS WITH CONTRAST TECHNIQUE: Multidetector CT imaging of the abdomen and pelvis was performed using the standard protocol following bolus administration of intravenous contrast. CONTRAST:  141mL OMNIPAQUE IOHEXOL 300 MG/ML  SOLN COMPARISON:  No priors. FINDINGS: Lower chest: Extensive airspace consolidation in the left lower lobe, as well as additional areas of airspace consolidation in the right lower lobe, right middle lobe and inferior segment of the lingula. Atherosclerotic calcifications in the left anterior descending and right coronary arteries. Hepatobiliary: No suspicious cystic or solid hepatic lesions. No intra or extrahepatic biliary ductal dilatation. Gallbladder is normal in appearance. Pancreas: No pancreatic mass. No pancreatic ductal dilatation. No pancreatic or peripancreatic fluid collections or inflammatory changes. Spleen: Unremarkable. Adrenals/Urinary Tract: Bilateral kidneys and bilateral adrenal  glands are normal in appearance. No hydroureteronephrosis. Urinary bladder is normal in appearance. Stomach/Bowel: Normal appearance of the stomach. No pathologic dilatation of small bowel or colon. The appendix is not confidently identified and may be surgically absent. Regardless, there are no inflammatory changes noted adjacent to the cecum to suggest the presence of an acute appendicitis at this time. Vascular/Lymphatic: Aortic atherosclerosis, without evidence of aneurysm or dissection in the abdominal or pelvic vasculature. No lymphadenopathy noted in the abdomen or pelvis. Reproductive: Status post hysterectomy. Ovaries are unremarkable in appearance. Other: No significant volume of ascites.  No pneumoperitoneum. Musculoskeletal: Compression fracture of superior endplate of L3 with D34-534 loss of anterior vertebral body height, with clearly visible fracture lines anteriorly, but no surrounding soft tissue swelling indicative of a subacute fracture. There are  no aggressive appearing lytic or blastic lesions noted in the visualized portions of the skeleton. IMPRESSION: 1. No acute findings are noted in the abdomen or pelvis to account for the patient's symptoms. Specifically, no evidence of acute diverticulitis. 2. However, there is multifocal airspace consolidation throughout the visualize lung bases, compatible with severe multilobar pneumonia. This likely accounts for the patient's history of fever. 3. Subacute compression fracture of superior endplate of L3 with D34-534 loss of anterior vertebral body height. 4. Aortic atherosclerosis, in addition to least 2 vessel coronary artery disease. Please note that although the presence of coronary artery calcium documents the presence of coronary artery disease, the severity of this disease and any potential stenosis cannot be assessed on this non-gated CT examination. Assessment for potential risk factor modification, dietary therapy or pharmacologic therapy may be  warranted, if clinically indicated. Electronically Signed   By: Vinnie Langton M.D.   On: 07/12/2019 15:44   Dg Chest Port 1 View  Result Date: 07/12/2019 CLINICAL DATA:  Hypoxia EXAM: PORTABLE CHEST 1 VIEW COMPARISON:  07/12/2011 FINDINGS: Cardiac shadow is stable. Aortic calcifications are again seen. Bibasilar infiltrates are noted worsening from the prior exam. They appear increased when compared with the most recent CT of the abdomen and pelvis as well. Skin fold is noted over the upper right chest. No pneumothorax is seen. No definitive bony abnormality is noted. IMPRESSION: Increasing bibasilar infiltrates. Electronically Signed   By: Inez Catalina M.D.   On: 07/12/2019 19:14   Dg Chest Portable 1 View  Result Date: 07/12/2019 CLINICAL DATA:  Right-sided chest pain and shortness of breath for 1 day. EXAM: PORTABLE CHEST 1 VIEW COMPARISON:  Chest x-ray 02/07/2017 FINDINGS: The heart is upper limits of normal in size. There is mild tortuosity and calcification of the thoracic aorta. There are patchy bibasilar infiltrates. No pleural effusions. No pulmonary edema. The bony thorax is intact. IMPRESSION: Bibasilar infiltrates. Electronically Signed   By: Marijo Sanes M.D.   On: 07/12/2019 13:39        Scheduled Meds:  furosemide  60 mg Intravenous Once   [START ON 07/14/2019] influenza vac split quadrivalent PF  0.5 mL Intramuscular Tomorrow-1000   LORazepam  0-4 mg Intravenous Q6H   Or   LORazepam  0-4 mg Oral Q6H   [START ON 07/15/2019] LORazepam  0-4 mg Intravenous Q12H   Or   [START ON 07/15/2019] LORazepam  0-4 mg Oral Q12H   mouth rinse  15 mL Mouth Rinse BID   thiamine  100 mg Oral Daily   Or   thiamine  100 mg Intravenous Daily   Continuous Infusions:  sodium chloride     ceFEPime (MAXIPIME) IV 2 g (07/13/19 [id number])   vancomycin            Aline August, MD [facility] 07/13/2019, 9:56 AM

## 2019-07-13 NOTE — Progress Notes (Signed)
Pt did not urinate since admitted to the floor. Bladder scan showed 288 cc of urine in bladder, pt denied discomfort and pain. Will continue to monitor.

## 2019-07-13 NOTE — Progress Notes (Signed)
RN paged NP because pt's MEWS was 4 and pt was more confused and RR had increased. Ordered ABG and NP to bedside. NP reviewed chart. S: She says she is not SOB, but she hesitates with answers. Denies pain except continued right flank pain which she had yesterday. No n/v. RN states her mental status change happened during past hour or so.  O: Chronically ill appearing female in NAD. BP 143/75. HR 100. RR 21. O2 sat 98% on 3L. T 98.9. Arouses easily and stays awake throughout questions. She is oriented to person and city but not time or why she is here. There is no increased WOB noted.  A/P: 1. Change in mental status-likely secondary to hypoxia. Already improved. PO2 65 on ABG. Continue O2 at 3L (was bumped from 2L). Her mental status at present is not that worrisome as the RN stated pt answered questions for me that she previously wouldn't answer. Her pH on ABG is normal. She is not hypercarbic. She did have some electrolyte disturbances this am on labs, so will make sure those are repeated. Overall, this could be some delirium from hospital and acute illness.  2. Multifocal PNA-being treated. Her WBCC is down today.  KJKG, NP [facility]

## 2019-07-13 NOTE — Progress Notes (Signed)
   07/13/19 2059  Vitals  Pulse Rate (!) 101  ECG Heart Rate (!) 104  Resp (!) 44  Oxygen Therapy  SpO2 96 %  MEWS Score  MEWS RR 3  MEWS Pulse 1  MEWS Systolic 0  MEWS LOC 0  MEWS Temp 0  MEWS Score 4  MEWS Score Color Red  Increased confusion, RR elevated in 30s, pt in mild respiratory distress. SpO2 97% on 2 L. Baltazar Najjar, NP notified.

## 2019-07-13 NOTE — Progress Notes (Signed)
  Echocardiogram 2D Echocardiogram has been attempted. Patient leaving for CT. Will reattempt at later time.  Arhum Peeples G Jaxn Chiquito 07/13/2019, 1:37 PM

## 2019-07-13 NOTE — Progress Notes (Signed)
Blood transfusion completed at 0444, lab notified not to draw labs before 0644.

## 2019-07-14 DIAGNOSIS — E785 Hyperlipidemia, unspecified: Secondary | ICD-10-CM

## 2019-07-14 LAB — CBC WITH DIFFERENTIAL/PLATELET
Abs Immature Granulocytes: 0.43 10*3/uL — ABNORMAL HIGH (ref 0.00–0.07)
Basophils Absolute: 0.1 10*3/uL (ref 0.0–0.1)
Basophils Relative: 0 %
Eosinophils Absolute: 0 10*3/uL (ref 0.0–0.5)
Eosinophils Relative: 0 %
HCT: 28.4 % — ABNORMAL LOW (ref 36.0–46.0)
Hemoglobin: 8.8 g/dL — ABNORMAL LOW (ref 12.0–15.0)
Immature Granulocytes: 2 %
Lymphocytes Relative: 9 %
Lymphs Abs: 1.9 10*3/uL (ref 0.7–4.0)
MCH: 24.1 pg — ABNORMAL LOW (ref 26.0–34.0)
MCHC: 31 g/dL (ref 30.0–36.0)
MCV: 77.8 fL — ABNORMAL LOW (ref 80.0–100.0)
Monocytes Absolute: 1.3 10*3/uL — ABNORMAL HIGH (ref 0.1–1.0)
Monocytes Relative: 6 %
Neutro Abs: 18.3 10*3/uL — ABNORMAL HIGH (ref 1.7–7.7)
Neutrophils Relative %: 83 %
Platelets: 593 10*3/uL — ABNORMAL HIGH (ref 150–400)
RBC: 3.65 MIL/uL — ABNORMAL LOW (ref 3.87–5.11)
RDW: 25.4 % — ABNORMAL HIGH (ref 11.5–15.5)
WBC: 22 10*3/uL — ABNORMAL HIGH (ref 4.0–10.5)
nRBC: 1.1 % — ABNORMAL HIGH (ref 0.0–0.2)

## 2019-07-14 LAB — BLOOD CULTURE ID PANEL (REFLEXED)

## 2019-07-14 LAB — BPAM RBC
Blood Product Expiration Date: 202012122359
Blood Product Expiration Date: 202012132359
ISSUE DATE / TIME: 202011091605
ISSUE DATE / TIME: 202011100104
Unit Type and Rh: 7300
Unit Type and Rh: 7300

## 2019-07-14 LAB — COMPREHENSIVE METABOLIC PANEL
ALT: 18 U/L (ref 0–44)
AST: 33 U/L (ref 15–41)
Albumin: 2 g/dL — ABNORMAL LOW (ref 3.5–5.0)
Alkaline Phosphatase: 79 U/L (ref 38–126)
Anion gap: 12 (ref 5–15)
BUN: 12 mg/dL (ref 8–23)
CO2: 19 mmol/L — ABNORMAL LOW (ref 22–32)
Calcium: 8.4 mg/dL — ABNORMAL LOW (ref 8.9–10.3)
Chloride: 114 mmol/L — ABNORMAL HIGH (ref 98–111)
Creatinine, Ser: 0.62 mg/dL (ref 0.44–1.00)
GFR calc Af Amer: >60 mL/min (ref >60)
GFR calc non Af Amer: >60 mL/min (ref >60)
Glucose, Bld: 86 mg/dL (ref 70–99)
Potassium: 3.2 mmol/L — ABNORMAL LOW (ref 3.5–5.1)
Sodium: 145 mmol/L (ref 135–145)
Total Bilirubin: 1.4 mg/dL — ABNORMAL HIGH (ref 0.3–1.2)
Total Protein: 7.1 g/dL (ref 6.5–8.1)

## 2019-07-14 LAB — TYPE AND SCREEN
ABO/RH(D): B POS
Antibody Screen: NEGATIVE
Unit division: 0
Unit division: 0

## 2019-07-14 LAB — MAGNESIUM: Magnesium: 1.7 mg/dL (ref 1.7–2.4)

## 2019-07-14 LAB — PROCALCITONIN: Procalcitonin: 3.53 ng/mL

## 2019-07-14 MED ORDER — SODIUM CHLORIDE 0.9 % IV SOLN
2.0000 g | INTRAVENOUS | Status: DC
  Administered 2019-07-14: 2 g via INTRAVENOUS
  Filled 2019-07-14: qty 20

## 2019-07-14 NOTE — Progress Notes (Signed)
PHARMACY - PHYSICIAN COMMUNICATION CRITICAL VALUE ALERT - BLOOD CULTURE IDENTIFICATION (BCID)  Angel Hebert is an 63 y.o. female who presented to [facility] on 07/12/2019 with a chief complaint of right flank pain - found to have pneumococcal bacteremia  Assessment:  Pneumococcal bacteremia from community acquired pneumonia  Name of physician (or Provider) Contacted: Dr. Ewing Schlein  Current antibiotics: Vancomycin + Cefepime  Changes to prescribed antibiotics recommended: Will change to Ceftriaxone 2g IV daily   Results for orders placed or performed during the hospital encounter of 07/12/19  Blood Culture ID Panel (Reflexed) (Collected: 07/12/2019  1:30 PM)  Result Value Ref Range   Enterococcus species NOT DETECTED NOT DETECTED   Listeria monocytogenes NOT DETECTED NOT DETECTED   Staphylococcus species NOT DETECTED NOT DETECTED   Staphylococcus aureus (BCID) NOT DETECTED NOT DETECTED   Streptococcus species DETECTED (A) NOT DETECTED   Streptococcus agalactiae NOT DETECTED NOT DETECTED   Streptococcus pneumoniae DETECTED (A) NOT DETECTED   Streptococcus pyogenes NOT DETECTED NOT DETECTED   Acinetobacter baumannii NOT DETECTED NOT DETECTED   Enterobacteriaceae species NOT DETECTED NOT DETECTED   Enterobacter cloacae complex NOT DETECTED NOT DETECTED   Escherichia coli NOT DETECTED NOT DETECTED   Klebsiella oxytoca NOT DETECTED NOT DETECTED   Klebsiella pneumoniae NOT DETECTED NOT DETECTED   Proteus species NOT DETECTED NOT DETECTED   Serratia marcescens NOT DETECTED NOT DETECTED   Haemophilus influenzae NOT DETECTED NOT DETECTED   Neisseria meningitidis NOT DETECTED NOT DETECTED   Pseudomonas aeruginosa NOT DETECTED NOT DETECTED   Candida albicans NOT DETECTED NOT DETECTED   Candida glabrata NOT DETECTED NOT DETECTED   Candida krusei NOT DETECTED NOT DETECTED   Candida parapsilosis NOT DETECTED NOT DETECTED   Candida tropicalis NOT DETECTED NOT DETECTED    [name] 07/14/2019  8:52 AM

## 2019-07-14 NOTE — Progress Notes (Signed)
PROGRESS NOTE    Angel Hebert  [id number] DOB: March 02, 1956 DOA: 07/12/2019 PCP: Charlott Rakes, MD   Brief Narrative: Angel Hebert is a 63 y.o. female with history of hypertension, GERD, asthma, osteoarthritis. Patient presented secondary to right flank pain and found to have multifocal pneumonia   Assessment & Plan:   Principal Problem:   Symptomatic anemia Active Problems:   Hyperlipidemia   Essential hypertension   GERD   Sepsis ([place])   Hypokalemia   Lobar pneumonia ([place])   Strep bacteremia Patient transitioned to Ceftriaxone. Transthoracic Echocardiogram not suggestive of vegetations. -Await final culture results -Continue Ceftriaxone  Symptomatic anemia Baseline hemoglobin difficult to quantify but possible baseline is between 9-10. Hemoglobin of 5.2 on admission and is improved s/p 2 units of PRBC  Multifocal pneumonia Strep pneumo positive. Emprically treated with Vancomycin and Cefepime now transitioned to Ceftriaxone. -Continue Ceftriaxone  Sepsis Present on admission. Secondary to above.  Leukocytosis Secondary to bacteremia/pneumonia  Thrombocytosis Likely secondary to bacteremia. Anticipate improvement with treatment.  Hypoalbuminemia Likely secondary to poor nutrition. Dietician consult.  Hypokalemia Replete as needed  Hypertension Slight uncontrolled. On Hyzaar as an outpatient.  Hyperlipidemia  On Lipitor as an outpatient  GERD On Protonix as an outpatient  L3 compression fracture Subacute. -Pain management    DVT prophylaxis: SCDs Code Status:   Code Status: DNR Family Communication: None Disposition Plan: Discharge pending antibiotic regimen recommendations   Consultants:   None  Procedures:  11/10: Transthoracic Echocardiogram   IMPRESSIONS    1. Left ventricular ejection fraction, by visual estimation, is 45 to 50%. The left ventricle has normal function. There is no left ventricular hypertrophy.  2. Left ventricular diastolic parameters are consistent with Grade I diastolic dysfunction (impaired relaxation).  3. The left ventricle demonstrates global hypokinesis.  4. Global right ventricle has normal systolic function.The right ventricular size is normal. No increase in right ventricular wall thickness.  5. Left atrial size was normal.  6. Right atrial size was normal.  7. Trivial pericardial effusion is present.  8. The mitral valve is normal in structure. Mild mitral valve regurgitation. No evidence of mitral stenosis.  9. The tricuspid valve is normal in structure. Tricuspid valve regurgitation is trivial. 10. The aortic valve is tricuspid. Aortic valve regurgitation is not visualized. Mild aortic valve sclerosis without stenosis. 11. TR signal is inadequate for assessing pulmonary artery systolic pressure. 12. The inferior vena cava is dilated in size with >50% respiratory variability, suggesting right atrial pressure of 8 mmHg.  FINDINGS  Left Ventricle: Left ventricular ejection fraction, by visual estimation, is 45 to 50%. The left ventricle has normal function. The left ventricle demonstrates global hypokinesis. There is no left ventricular hypertrophy. Left ventricular diastolic  parameters are consistent with Grade I diastolic dysfunction (impaired relaxation).  Right Ventricle: The right ventricular size is normal. No increase in right ventricular wall thickness. Global RV systolic function is has normal systolic function.  Left Atrium: Left atrial size was normal in size.  Right Atrium: Right atrial size was normal in size  Pericardium: Trivial pericardial effusion is present.  Mitral Valve: The mitral valve is normal in structure. No evidence of mitral valve stenosis by observation. Mild mitral valve regurgitation.  Tricuspid Valve: The tricuspid valve is normal in structure. Tricuspid valve regurgitation is trivial.  Aortic Valve: The aortic valve is  tricuspid. Aortic valve regurgitation is not visualized. Mild aortic valve sclerosis is present, with no evidence of aortic valve stenosis.  Pulmonic Valve: The pulmonic valve  was normal in structure. Pulmonic valve regurgitation is trivial.  Aorta: The aortic root is normal in size and structure.  Venous: The inferior vena cava is dilated in size with greater than 50% respiratory variability, suggesting right atrial pressure of 8 mmHg.  IAS/Shunts: No atrial level shunt detected by color flow Doppler.  Antimicrobials:  Vancomycin  Cefepime  Ceftriaxone    Subjective: No concerns currently.  Objective: Vitals:   07/14/19 0800 07/14/19 1000 07/14/19 1100 07/14/19 1200  BP: (!) 144/92 (!) 142/78 (!) 146/91   Pulse: 100 98 (!) 101 94  Resp:   (!) 23   Temp:   98.1 F (36.7 C)   TempSrc:   Oral   SpO2:      Weight:      Height:        Intake/Output Summary (Last 24 hours) at 07/14/2019 1723 Last data filed at 07/14/2019 1600 Gross per 24 hour  Intake 1000 ml  Output 1251 ml  Net -251 ml   Filed Weights   07/12/19 2120 07/14/19 0326  Weight: 66.6 kg 67.7 kg    Examination:  General exam: Appears calm and comfortable Respiratory system: Clear to auscultation. Respiratory effort normal. Cardiovascular system: S1 & S2 heard, RRR. No murmurs, rubs, gallops or clicks. Gastrointestinal system: Abdomen is nondistended, soft and nontender. No organomegaly or masses felt. Normal bowel sounds heard. Central nervous system: Alert and oriented. No focal neurological deficits. Extremities: No edema. No calf tenderness Skin: No cyanosis. No rashes Psychiatry: Judgement and insight appear normal. Mood & affect appropriate.     Data Reviewed: I have personally reviewed following labs and imaging studies  CBC: Recent Labs  Lab 07/12/19 1326 07/13/19 0806 07/14/19 0240  WBC 38.1* 29.6* 22.0*  NEUTROABS 36.6* 25.8* 18.3*  HGB 5.2* 8.3* 8.8*  HCT 17.3* 25.9* 28.4*    MCV 73.3* 75.5* 77.8*  PLT 590* 620* 0000000*   Basic Metabolic Panel: Recent Labs  Lab 07/12/19 1323 07/12/19 1326 07/13/19 0636 07/13/19 0806 07/14/19 0240  NA  --  135 142 146* 145  K  --  2.0* 2.9* 2.9* 3.2*  CL  --  101 112* 114* 114*  CO2  --  20* 20* 20* 19*  GLUCOSE  --  147* 122* 120* 86  BUN  --  20 14 14 12   CREATININE  --  0.85 0.70 0.49 0.62  CALCIUM  --  8.7* 8.2* 8.4* 8.4*  MG 2.0  --   --  1.9 1.7   GFR: Estimated Creatinine Clearance: 70.9 mL/min (by C-G formula based on SCr of 0.62 mg/dL). Liver Function Tests: Recent Labs  Lab 07/12/19 1326 07/13/19 0806 07/14/19 0240  AST 70* 47* 33  ALT 21 18 18   ALKPHOS 83 79 79  BILITOT 1.9* 3.1* 1.4*  PROT 7.6 6.9 7.1  ALBUMIN 2.3* 1.9* 2.0*   Recent Labs  Lab 07/12/19 1326  LIPASE 286*   No results for input(s): AMMONIA in the last 168 hours. Coagulation Profile: No results for input(s): INR, PROTIME in the last 168 hours. Cardiac Enzymes: No results for input(s): CKTOTAL, CKMB, CKMBINDEX, TROPONINI in the last 168 hours. BNP (last 3 results) No results for input(s): PROBNP in the last 8760 hours. HbA1C: No results for input(s): HGBA1C in the last 72 hours. CBG: No results for input(s): GLUCAP in the last 168 hours. Lipid Profile: No results for input(s): CHOL, HDL, LDLCALC, TRIG, CHOLHDL, LDLDIRECT in the last 72 hours. Thyroid Function Tests: No results for input(s): TSH,  T4TOTAL, FREET4, T3FREE, THYROIDAB in the last 72 hours. Anemia Panel: Recent Labs    07/13/19 0806  FERRITIN 189   Sepsis Labs: Recent Labs  Lab 07/12/19 1524 07/12/19 2114 07/13/19 0806 07/14/19 0240  PROCALCITON  --   --  4.82 3.53  LATICACIDVEN 1.8 1.5  --   --     Recent Results (from the past 240 hour(s))  SARS CORONAVIRUS 2 (TAT 6-24 HRS) Nasopharyngeal Nasopharyngeal Swab     Status: None   Collection Time: 07/12/19  1:25 PM   Specimen: Nasopharyngeal Swab  Result Value Ref Range Status   SARS Coronavirus  2 NEGATIVE NEGATIVE Final    Comment: (NOTE) SARS-CoV-2 target nucleic acids are NOT DETECTED. The SARS-CoV-2 RNA is generally detectable in upper and lower respiratory specimens during the acute phase of infection. Negative results do not preclude SARS-CoV-2 infection, do not rule out co-infections with other pathogens, and should not be used as the sole basis for treatment or other patient management decisions. Negative results must be combined with clinical observations, patient history, and epidemiological information. The expected result is Negative. Fact Sheet for Patients: SugarRoll.be Fact Sheet for Healthcare Providers: https://www.woods-mathews.com/ This test is not yet approved or cleared by the [place] FDA and  has been authorized for detection and/or diagnosis of SARS-CoV-2 by FDA under an Emergency Use Authorization (EUA). This EUA will remain  in effect (meaning this test can be used) for the duration of the COVID-19 declaration under Section 56 4(b)(1) of the Act, 21 U.S.C. section 360bbb-3(b)(1), unless the authorization is terminated or revoked sooner. Performed at [facility], [place] [address], [place], [place] [address]   Blood culture (routine x 2)     Status: Abnormal (Preliminary result)   Collection Time: 07/12/19  1:30 PM   Specimen: BLOOD  Result Value Ref Range Status   Specimen Description BLOOD LEFT ANTECUBITAL  Final   Special Requests   Final    BOTTLES DRAWN AEROBIC AND ANAEROBIC Blood Culture adequate volume   Culture  Setup Time   Final    GRAM POSITIVE COCCI IN CHAINS AEROBIC BOTTLE ONLY CRITICAL RESULT CALLED TO, READ BACK BY AND VERIFIED WITH: PHARMD J FRAENS [id number] AT 25 AM    Culture (A)  Final    STREPTOCOCCUS PNEUMONIAE CULTURE REINCUBATED FOR BETTER GROWTH Performed at [facility], [place] [address] [place], [place] [address]    Report Status PENDING  Incomplete  Blood  culture (routine x 2)     Status: None (Preliminary result)   Collection Time: 07/12/19  1:30 PM   Specimen: BLOOD  Result Value Ref Range Status   Specimen Description BLOOD RIGHT ANTECUBITAL  Final   Special Requests   Final    BOTTLES DRAWN AEROBIC AND ANAEROBIC Blood Culture adequate volume   Culture  Setup Time   Final    GRAM POSITIVE COCCI IN CHAINS IN BOTH AEROBIC AND ANAEROBIC BOTTLES CRITICAL RESULT CALLED TO, READ BACK BY AND VERIFIED WITHDaun Peacock, PHARMD, AT 1048 07/13/19 BY D. VANHOOK Performed at [facility], [place] [address], [place],  [address]    Culture GRAM POSITIVE COCCI  Final   Report Status PENDING  Incomplete  Blood Culture ID Panel (Reflexed)     Status: Abnormal   Collection Time: 07/12/19  1:30 PM  Result Value Ref Range Status   Enterococcus species NOT DETECTED NOT DETECTED Final   Listeria monocytogenes NOT DETECTED NOT DETECTED Final   Staphylococcus  species NOT DETECTED NOT DETECTED Final   Staphylococcus aureus (BCID) NOT DETECTED NOT DETECTED Final   Streptococcus species DETECTED (A) NOT DETECTED Final    Comment: CRITICAL RESULT CALLED TO, READ BACK BY AND VERIFIED WITH: PHARMD J FRAENS [id number] AT 847 AM BY CM    Streptococcus agalactiae NOT DETECTED NOT DETECTED Final   Streptococcus pneumoniae DETECTED (A) NOT DETECTED Final    Comment: CRITICAL RESULT CALLED TO, READ BACK BY AND VERIFIED WITH: PHARMD J FRAENS [id number] AT 847 AM BY CM    Streptococcus pyogenes NOT DETECTED NOT DETECTED Final   Acinetobacter baumannii NOT DETECTED NOT DETECTED Final   Enterobacteriaceae species NOT DETECTED NOT DETECTED Final   Enterobacter cloacae complex NOT DETECTED NOT DETECTED Final   Escherichia coli NOT DETECTED NOT DETECTED Final   Klebsiella oxytoca NOT DETECTED NOT DETECTED Final   Klebsiella pneumoniae NOT DETECTED NOT DETECTED Final   Proteus species NOT DETECTED NOT DETECTED Final   Serratia marcescens NOT DETECTED NOT DETECTED Final    Haemophilus influenzae NOT DETECTED NOT DETECTED Final   Neisseria meningitidis NOT DETECTED NOT DETECTED Final   Pseudomonas aeruginosa NOT DETECTED NOT DETECTED Final   Candida albicans NOT DETECTED NOT DETECTED Final   Candida glabrata NOT DETECTED NOT DETECTED Final   Candida krusei NOT DETECTED NOT DETECTED Final   Candida parapsilosis NOT DETECTED NOT DETECTED Final   Candida tropicalis NOT DETECTED NOT DETECTED Final    Comment: Performed at [facility], [address], [place], [place] [address]  MRSA PCR Screening     Status: None   Collection Time: 07/12/19 11:48 PM   Specimen: Nasal Mucosa; Nasopharyngeal  Result Value Ref Range Status   MRSA by PCR NEGATIVE NEGATIVE Final    Comment:        The GeneXpert MRSA Assay (FDA approved for NASAL specimens only), is one component of a comprehensive MRSA colonization surveillance program. It is not intended to diagnose MRSA infection nor to guide or monitor treatment for MRSA infections. Performed at [facility], [place] [address], [place], [place] [address]          Radiology Studies: Ct Angio Chest Pe W Or Wo Contrast  Result Date: 07/13/2019 CLINICAL DATA:  63 year old female with shortness of breath. EXAM: CT ANGIOGRAPHY CHEST WITH CONTRAST TECHNIQUE: Multidetector CT imaging of the chest was performed using the standard protocol during bolus administration of intravenous contrast. Multiplanar CT image reconstructions and MIPs were obtained to evaluate the vascular anatomy. CONTRAST:  153mL OMNIPAQUE IOHEXOL 350 MG/ML SOLN COMPARISON:  Chest CT dated 02/05/2017. FINDINGS: Cardiovascular: There is mild cardiomegaly. No pericardial effusion. There is coronary vascular calcification primarily involving the LAD. Minimal atherosclerotic calcification of the aortic arch. No aneurysmal dilatation or dissection. The origins of the great vessels of the aortic arch appear patent as visualized. No large or central  pulmonary artery embolus identified. There is intraluminal filling defect involving distal/subsegmental right upper lobe branch (axial series 6 images 136-142 and sagittal series 9, image 52). Additional similar tiny filling defect in a distal/subsegmental left upper lobe pulmonary artery branch (axial series 6 image 139). These may represent areas of scarring or sequela of prior PEs. Acute small subsegmental pulmonary artery emboli are not excluded. Clinical correlation is recommended. Mediastinum/Nodes: No hilar or mediastinal adenopathy. Evaluation however is limited due to consolidative changes of the lower lobes. The esophagus is grossly unremarkable. No mediastinal fluid collection. Lungs/Pleura: Large areas of consolidative changes involving the lower lobes  bilaterally with air bronchograms most consistent with pneumonia. Clinical correlation and follow-up to solution recommended to exclude underlying mass. There is a segmental area of consolidation in the right middle lobe. Small bilateral pleural effusions noted. There is no pneumothorax. The central airways are patent. Upper Abdomen: No acute abnormality. Musculoskeletal: No acute osseous pathology. Degenerative changes of the spine. Review of the MIP images confirms the above findings. IMPRESSION: 1. No CT evidence of large or central pulmonary artery embolus. Small intraluminal filling defects involving the distal/subsegmental right upper lobe and left upper lobe pulmonary artery branches may represent areas of scarring or sequela of prior PEs. Acute small subsegmental pulmonary artery emboli are not excluded. Clinical correlation is recommended. 2. Bilateral lower lobe and right middle lobe consolidative changes most consistent with pneumonia. Clinical correlation and follow-up to solution recommended. 3. Small bilateral pleural effusions. 4. Aortic Atherosclerosis (ICD10-I70.0). Electronically Signed   By: Anner Crete M.D.   On: 07/13/2019 14:30    Dg Chest Port 1 View  Result Date: 07/12/2019 CLINICAL DATA:  Hypoxia EXAM: PORTABLE CHEST 1 VIEW COMPARISON:  07/12/2011 FINDINGS: Cardiac shadow is stable. Aortic calcifications are again seen. Bibasilar infiltrates are noted worsening from the prior exam. They appear increased when compared with the most recent CT of the abdomen and pelvis as well. Skin fold is noted over the upper right chest. No pneumothorax is seen. No definitive bony abnormality is noted. IMPRESSION: Increasing bibasilar infiltrates. Electronically Signed   By: Inez Catalina M.D.   On: 07/12/2019 19:14        Scheduled Meds:  influenza vac split quadrivalent PF  0.5 mL Intramuscular Tomorrow-1000   [START ON 07/15/2019] LORazepam  0-4 mg Intravenous Q12H   Or   [START ON 07/15/2019] LORazepam  0-4 mg Oral Q12H   mouth rinse  15 mL Mouth Rinse BID   mometasone-formoterol  2 puff Inhalation BID   montelukast  10 mg Oral QHS   thiamine  100 mg Oral Daily   Or   thiamine  100 mg Intravenous Daily   Continuous Infusions:  sodium chloride     cefTRIAXone (ROCEPHIN)  IV 200 mL/hr at 07/14/19 1600     LOS: 2 days     Cordelia Poche, MD [facility] 07/14/2019, 5:23 PM  If 7PM-7AM, please contact night-coverage [email or web address]

## 2019-07-14 NOTE — Evaluation (Signed)
Clinical/Bedside Swallow Evaluation Patient Details  Name: Angel Hebert MRN: [id number] Date of Birth: 1955-10-03  Today's Date: 07/14/2019 Time: SLP Start Time (ACUTE ONLY): 0959 SLP Stop Time (ACUTE ONLY): 1024 SLP Time Calculation (min) (ACUTE ONLY): 25 min  Past Medical History:  Past Medical History:  Diagnosis Date  . Arthritis   . Asthma    last year in December  . Dyspnea   . GERD (gastroesophageal reflux disease)   . High cholesterol   . Hypertension   . Pneumonia 01/2017   Past Surgical History:  Past Surgical History:  Procedure Laterality Date  . ABDOMINAL HYSTERECTOMY    . nasal polyps     removed just this past tuesday at [facility]  . 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 with hx of hypertension, GERD, asthma, osteoarthritis presented with right flank pain on 07/12/2019.  She was found to be hypotensive with leukocytosis and hemoglobin of 5.2.  CT abdomen and pelvis showed no acute findings but showed multifocal airspace consolidation throughout lung bases compatible with severe multilobar pneumonia along with subacute compression fracture of the superior endplate of the L3. Latest chest CT 11/10 showed Bilateral lower lobe and right middle lobe consolidative changes most consistent with pneumonia, small bilateral pleural effusions, and aortic atherosclerosis. Diet currently reg/thin.    Assessment / Plan / Recommendation Clinical Impression  Pt encountered sitting up in chair with language of confusion. Speech significant for dysarthria and hoarse vocal quality reducing intelligibility to phrase level. Oral motor exam WNL with cueing for command following. Oral cavity edentulous with lingual peeling, oral care provided by SLP. Pt observed with thin via cup,  puree, and nectar thick liquids. [place] administered with pt requiring breaks for respiration and throat clears/eructation following. Thin via cup single sips resulted in immediate / frequent throat clears. After initial thin presentations pt produced frequent throat clears during verbalizations. Puree and nectar thick resulted in throat clear, do not suspect due to aspiration. Frequent rest breaks required across trials due to RR rising to range of 35-41. After PO presentations pt produced multiple belches and mild regurgitation- suspect due to esophageal dysfunction. Instrumental study recommended- will schedule MBS tomorrow. Diet downgraded to Dysphagia 2 with nectar thick liquids, meds crushed in puree.  SLP Visit Diagnosis: Dysphagia, unspecified (R13.10)    Aspiration Risk  Moderate aspiration risk    Diet Recommendation Dysphagia 2 (Fine chop);Nectar-thick liquid   Liquid Administration via: Cup;No straw Medication Administration: Crushed with puree Supervision: Full supervision/cueing for compensatory strategies;Staff to assist with self feeding Compensations: Slow rate;Small sips/bites;Minimize environmental distractions Postural Changes: Seated upright at 90 degrees;Remain upright for at least 30 minutes after po intake    Other  Recommendations Recommended Consults: Consider GI evaluation Oral Care Recommendations: Oral care QID;Staff/trained caregiver to provide oral care Other Recommendations: Remove water pitcher   Follow up Recommendations Skilled Nursing facility;24 hour supervision/assistance      Frequency and Duration min 2x/week  2 weeks       Prognosis Prognosis for Safe Diet Advancement: Guarded Barriers to Reach Goals: Cognitive deficits(Respiratory impairments)      Swallow Study   General Date of Onset: 07/12/19 HPI: Angel Hebert is a 63 y.o. with hx of hypertension, GERD, asthma, osteoarthritis presented with right flank pain on 07/12/2019.  She was  found to be hypotensive with leukocytosis and hemoglobin of 5.2.  CT abdomen and pelvis showed no acute findings but showed multifocal airspace consolidation throughout lung bases compatible with severe multilobar pneumonia along with subacute compression fracture of the superior endplate of the L3. Latest chest CT 11/10 showed Bilateral lower lobe and right middle lobe consolidative changes most consistent with pneumonia, small bilateral pleural effusions, and aortic atherosclerosis. Diet currently reg/thin.  Type of Study: Bedside Swallow Evaluation Diet Prior to this Study: Regular;Thin liquids Temperature Spikes Noted: No Respiratory Status: Nasal cannula History of Recent Intubation: No Behavior/Cognition: Confused;Requires cueing;Alert Oral Cavity Assessment: Dry Oral Care Completed by SLP: Yes Oral Cavity - Dentition: Edentulous(1 small broken tooth bottom middle) Self-Feeding Abilities: Needs assist Patient Positioning: Upright in chair Baseline Vocal Quality: Hoarse;Wet Volitional Cough: Strong    Oral/Motor/Sensory Function Overall Oral Motor/Sensory Function: Within functional limits   Ice Chips Ice chips: Not tested   Thin Liquid Thin Liquid: Impaired Presentation: Cup Pharyngeal  Phase Impairments: Wet Vocal Quality;Throat Clearing - Immediate;Change in Vital Signs(Increased RR (35-40))    Nectar Thick Nectar Thick Liquid: Impaired Presentation: Cup Pharyngeal Phase Impairments: Change in Vital Signs(increased RR (35-40)) Other Comments: f   Honey Thick Honey Thick Liquid: Not tested   Puree Puree: Impaired Presentation: Spoon Pharyngeal Phase Impairments: Throat Clearing - Immediate;Change in Vital Signs(increased RR )   Solid     Solid: Not tested      Purity Irmen 07/14/2019,11:43 AM

## 2019-07-15 ENCOUNTER — Inpatient Hospital Stay (HOSPITAL_COMMUNITY): Payer: Medicaid Other

## 2019-07-15 ENCOUNTER — Encounter (HOSPITAL_COMMUNITY): Payer: Self-pay

## 2019-07-15 DIAGNOSIS — J45909 Unspecified asthma, uncomplicated: Secondary | ICD-10-CM

## 2019-07-15 DIAGNOSIS — R7881 Bacteremia: Secondary | ICD-10-CM

## 2019-07-15 DIAGNOSIS — I10 Essential (primary) hypertension: Secondary | ICD-10-CM

## 2019-07-15 DIAGNOSIS — J13 Pneumonia due to Streptococcus pneumoniae: Secondary | ICD-10-CM

## 2019-07-15 DIAGNOSIS — B953 Streptococcus pneumoniae as the cause of diseases classified elsewhere: Secondary | ICD-10-CM

## 2019-07-15 DIAGNOSIS — K219 Gastro-esophageal reflux disease without esophagitis: Secondary | ICD-10-CM

## 2019-07-15 DIAGNOSIS — M1990 Unspecified osteoarthritis, unspecified site: Secondary | ICD-10-CM

## 2019-07-15 LAB — CBC
HCT: 28.3 % — ABNORMAL LOW (ref 36.0–46.0)
Hemoglobin: 9 g/dL — ABNORMAL LOW (ref 12.0–15.0)
MCH: 24.3 pg — ABNORMAL LOW (ref 26.0–34.0)
MCHC: 31.8 g/dL (ref 30.0–36.0)
MCV: 76.3 fL — ABNORMAL LOW (ref 80.0–100.0)
Platelets: 800 10*3/uL — ABNORMAL HIGH (ref 150–400)
RBC: 3.71 MIL/uL — ABNORMAL LOW (ref 3.87–5.11)
RDW: 26.1 % — ABNORMAL HIGH (ref 11.5–15.5)
WBC: 17.4 10*3/uL — ABNORMAL HIGH (ref 4.0–10.5)
nRBC: 1.8 % — ABNORMAL HIGH (ref 0.0–0.2)

## 2019-07-15 LAB — CULTURE, BLOOD (ROUTINE X 2)
Special Requests: ADEQUATE
Special Requests: ADEQUATE

## 2019-07-15 LAB — BASIC METABOLIC PANEL
Anion gap: 11 (ref 5–15)
BUN: 6 mg/dL — ABNORMAL LOW (ref 8–23)
CO2: 22 mmol/L (ref 22–32)
Calcium: 8.3 mg/dL — ABNORMAL LOW (ref 8.9–10.3)
Chloride: 108 mmol/L (ref 98–111)
Creatinine, Ser: 0.53 mg/dL (ref 0.44–1.00)
GFR calc Af Amer: >60 mL/min (ref >60)
GFR calc non Af Amer: >60 mL/min (ref >60)
Glucose, Bld: 92 mg/dL (ref 70–99)
Potassium: 2.8 mmol/L — ABNORMAL LOW (ref 3.5–5.1)
Sodium: 141 mmol/L (ref 135–145)

## 2019-07-15 LAB — LEGIONELLA PNEUMOPHILA SEROGP 1 UR AG: L. pneumophila Serogp 1 Ur Ag: NEGATIVE

## 2019-07-15 LAB — PROCALCITONIN: Procalcitonin: 1.68 ng/mL

## 2019-07-15 MED ORDER — RESOURCE THICKENUP CLEAR PO POWD
ORAL | Status: DC | PRN
  Filled 2019-07-15: qty 125

## 2019-07-15 MED ORDER — POTASSIUM CHLORIDE 10 MEQ/100ML IV SOLN
10.0000 meq | INTRAVENOUS | Status: AC
  Administered 2019-07-15 – 2019-07-16 (×4): 10 meq via INTRAVENOUS
  Filled 2019-07-15 (×3): qty 100

## 2019-07-15 MED ORDER — PENICILLIN G POTASSIUM 20000000 UNITS IJ SOLR
12.0000 10*6.[IU] | Freq: Two times a day (BID) | INTRAVENOUS | Status: DC
  Administered 2019-07-15 – 2019-07-21 (×12): 12 10*6.[IU] via INTRAVENOUS
  Filled 2019-07-15 (×18): qty 12

## 2019-07-15 NOTE — Progress Notes (Signed)
Patient refused to go to CT.  Patient states she does not feel well enough to go. Importance of test stressed to patient.  Patient in NAD.  Patient V/S stable.  Patient RR is 30+, but this is her recent baseline.  Will pass on to day shift tomorrow to attempt.

## 2019-07-15 NOTE — Progress Notes (Signed)
PROGRESS NOTE    Angel Hebert  [id number] DOB: 1956-08-26 DOA: 07/12/2019 PCP: Charlott Rakes, MD   Brief Narrative: Angel Hebert is a 63 y.o. female with history of hypertension, GERD, asthma, osteoarthritis. Patient presented secondary to right flank pain and found to have multifocal pneumonia.   Assessment & Plan:   Principal Problem:   Symptomatic anemia Active Problems:   Hyperlipidemia   Essential hypertension   GERD   Sepsis ([place])   Hypokalemia   Lobar pneumonia ([place])   Strep bacteremia Patient transitioned to Ceftriaxone. Transthoracic Echocardiogram not suggestive of vegetations. -Await final culture results -Continue Ceftriaxone -ID recommendations for abx duration/treatment  Symptomatic anemia Baseline hemoglobin difficult to quantify but possible baseline is between 9-10. Hemoglobin of 5.2 on admission and is improved s/p 2 units of PRBC  Multifocal pneumonia Strep pneumo positive. Emprically treated with Vancomycin and Cefepime now transitioned to Ceftriaxone. -Continue Ceftriaxone  Confusion Unsure of etiology. Possibly related to alcohol withdrawal although symptoms are not very significant. Will discuss patient's baseline with family if able.  Abdominal distension Non-tender abdomen. Patient is passing gas and states she is having bowel movements but none are documented -Abdominal x-ray  Sepsis Present on admission. Secondary to above. Resolved.  Leukocytosis Secondary to bacteremia/pneumonia. Improving -CBC today  Thrombocytosis Likely secondary to bacteremia. Anticipate improvement with treatment.  Hypoalbuminemia Likely secondary to poor nutrition. Dietician consult.  Hypokalemia Replete as needed  Hypertension Slight uncontrolled. On Hyzaar as an outpatient.  Hyperlipidemia  On Lipitor as an outpatient  GERD On Protonix as an outpatient  L3 compression fracture Subacute. -Pain management    DVT  prophylaxis: SCDs Code Status:   Code Status: DNR Family Communication: None Disposition Plan: Discharge pending antibiotic regimen recommendations   Consultants:   None  Procedures:  11/10: Transthoracic Echocardiogram   IMPRESSIONS    1. Left ventricular ejection fraction, by visual estimation, is 45 to 50%. The left ventricle has normal function. There is no left ventricular hypertrophy.  2. Left ventricular diastolic parameters are consistent with Grade I diastolic dysfunction (impaired relaxation).  3. The left ventricle demonstrates global hypokinesis.  4. Global right ventricle has normal systolic function.The right ventricular size is normal. No increase in right ventricular wall thickness.  5. Left atrial size was normal.  6. Right atrial size was normal.  7. Trivial pericardial effusion is present.  8. The mitral valve is normal in structure. Mild mitral valve regurgitation. No evidence of mitral stenosis.  9. The tricuspid valve is normal in structure. Tricuspid valve regurgitation is trivial. 10. The aortic valve is tricuspid. Aortic valve regurgitation is not visualized. Mild aortic valve sclerosis without stenosis. 11. TR signal is inadequate for assessing pulmonary artery systolic pressure. 12. The inferior vena cava is dilated in size with >50% respiratory variability, suggesting right atrial pressure of 8 mmHg.  FINDINGS  Left Ventricle: Left ventricular ejection fraction, by visual estimation, is 45 to 50%. The left ventricle has normal function. The left ventricle demonstrates global hypokinesis. There is no left ventricular hypertrophy. Left ventricular diastolic  parameters are consistent with Grade I diastolic dysfunction (impaired relaxation).  Right Ventricle: The right ventricular size is normal. No increase in right ventricular wall thickness. Global RV systolic function is has normal systolic function.  Left Atrium: Left atrial size was normal in  size.  Right Atrium: Right atrial size was normal in size  Pericardium: Trivial pericardial effusion is present.  Mitral Valve: The mitral valve is normal in structure. No evidence of mitral  valve stenosis by observation. Mild mitral valve regurgitation.  Tricuspid Valve: The tricuspid valve is normal in structure. Tricuspid valve regurgitation is trivial.  Aortic Valve: The aortic valve is tricuspid. Aortic valve regurgitation is not visualized. Mild aortic valve sclerosis is present, with no evidence of aortic valve stenosis.  Pulmonic Valve: The pulmonic valve was normal in structure. Pulmonic valve regurgitation is trivial.  Aorta: The aortic root is normal in size and structure.  Venous: The inferior vena cava is dilated in size with greater than 50% respiratory variability, suggesting right atrial pressure of 8 mmHg.  IAS/Shunts: No atrial level shunt detected by color flow Doppler.  Antimicrobials:  Vancomycin  Cefepime  Ceftriaxone    Subjective: No concerns today  Objective: Vitals:   07/15/19 0515 07/15/19 0526 07/15/19 0653 07/15/19 0819  BP:    (!) 144/88  Pulse:      Resp: (!) 31 (!) 26 (!) 22 (!) 22  Temp:    97.6 F (36.4 C)  TempSrc:    Oral  SpO2: 100%   93%  Weight:      Height:        Intake/Output Summary (Last 24 hours) at 07/15/2019 0832 Last data filed at 07/14/2019 2036 Gross per 24 hour  Intake 1000 ml  Output 201 ml  Net 799 ml   Filed Weights   07/12/19 2120 07/14/19 0326 07/15/19 0432  Weight: 66.6 kg 67.7 kg 69.9 kg    Examination:  General exam: Appears calm and comfortable Respiratory system: Clear to auscultation. Respiratory effort normal. Cardiovascular system: S1 & S2 heard, RRR. No murmurs, rubs, gallops or clicks. Gastrointestinal system: Abdomen is distended, soft and nontender. No organomegaly or masses felt. Normal bowel sounds heard. Central nervous system: Alert and oriented to person. No focal  neurological deficits. Extremities: No edema. No calf tenderness Skin: No cyanosis. No rashes Psychiatry: Judgement and insight appear impaired. Blunt affect    Data Reviewed: I have personally reviewed following labs and imaging studies  CBC: Recent Labs  Lab 07/12/19 1326 07/13/19 0806 07/14/19 0240  WBC 38.1* 29.6* 22.0*  NEUTROABS 36.6* 25.8* 18.3*  HGB 5.2* 8.3* 8.8*  HCT 17.3* 25.9* 28.4*  MCV 73.3* 75.5* 77.8*  PLT 590* 620* 0000000*   Basic Metabolic Panel: Recent Labs  Lab 07/12/19 1323 07/12/19 1326 07/13/19 0636 07/13/19 0806 07/14/19 0240  NA  --  135 142 146* 145  K  --  2.0* 2.9* 2.9* 3.2*  CL  --  101 112* 114* 114*  CO2  --  20* 20* 20* 19*  GLUCOSE  --  147* 122* 120* 86  BUN  --  20 14 14 12   CREATININE  --  0.85 0.70 0.49 0.62  CALCIUM  --  8.7* 8.2* 8.4* 8.4*  MG 2.0  --   --  1.9 1.7   GFR: Estimated Creatinine Clearance: 70.9 mL/min (by C-G formula based on SCr of 0.62 mg/dL). Liver Function Tests: Recent Labs  Lab 07/12/19 1326 07/13/19 0806 07/14/19 0240  AST 70* 47* 33  ALT 21 18 18   ALKPHOS 83 79 79  BILITOT 1.9* 3.1* 1.4*  PROT 7.6 6.9 7.1  ALBUMIN 2.3* 1.9* 2.0*   Recent Labs  Lab 07/12/19 1326  LIPASE 286*   No results for input(s): AMMONIA in the last 168 hours. Coagulation Profile: No results for input(s): INR, PROTIME in the last 168 hours. Cardiac Enzymes: No results for input(s): CKTOTAL, CKMB, CKMBINDEX, TROPONINI in the last 168 hours. BNP (last 3  results) No results for input(s): PROBNP in the last 8760 hours. HbA1C: No results for input(s): HGBA1C in the last 72 hours. CBG: No results for input(s): GLUCAP in the last 168 hours. Lipid Profile: No results for input(s): CHOL, HDL, LDLCALC, TRIG, CHOLHDL, LDLDIRECT in the last 72 hours. Thyroid Function Tests: No results for input(s): TSH, T4TOTAL, FREET4, T3FREE, THYROIDAB in the last 72 hours. Anemia Panel: Recent Labs    07/13/19 0806  FERRITIN 189    Sepsis Labs: Recent Labs  Lab 07/12/19 1524 07/12/19 2114 07/13/19 0806 07/14/19 0240 07/15/19 0324  PROCALCITON  --   --  4.82 3.53 1.68  LATICACIDVEN 1.8 1.5  --   --   --     Recent Results (from the past 240 hour(s))  SARS CORONAVIRUS 2 (TAT 6-24 HRS) Nasopharyngeal Nasopharyngeal Swab     Status: None   Collection Time: 07/12/19  1:25 PM   Specimen: Nasopharyngeal Swab  Result Value Ref Range Status   SARS Coronavirus 2 NEGATIVE NEGATIVE Final    Comment: (NOTE) SARS-CoV-2 target nucleic acids are NOT DETECTED. The SARS-CoV-2 RNA is generally detectable in upper and lower respiratory specimens during the acute phase of infection. Negative results do not preclude SARS-CoV-2 infection, do not rule out co-infections with other pathogens, and should not be used as the sole basis for treatment or other patient management decisions. Negative results must be combined with clinical observations, patient history, and epidemiological information. The expected result is Negative. Fact Sheet for Patients: SugarRoll.be Fact Sheet for Healthcare Providers: https://www.woods-mathews.com/ This test is not yet approved or cleared by the [place] FDA and  has been authorized for detection and/or diagnosis of SARS-CoV-2 by FDA under an Emergency Use Authorization (EUA). This EUA will remain  in effect (meaning this test can be used) for the duration of the COVID-19 declaration under Section 56 4(b)(1) of the Act, 21 U.S.C. section 360bbb-3(b)(1), unless the authorization is terminated or revoked sooner. Performed at [facility], [place] [address], [place], [place] [address]   Blood culture (routine x 2)     Status: Abnormal (Preliminary result)   Collection Time: 07/12/19  1:30 PM   Specimen: BLOOD  Result Value Ref Range Status   Specimen Description BLOOD LEFT ANTECUBITAL  Final   Special Requests   Final    BOTTLES DRAWN AEROBIC  AND ANAEROBIC Blood Culture adequate volume   Culture  Setup Time   Final    GRAM POSITIVE COCCI IN CHAINS AEROBIC BOTTLE ONLY CRITICAL RESULT CALLED TO, READ BACK BY AND VERIFIED WITH: PHARMD J FRAENS [id number] AT 49 AM    Culture (A)  Final    STREPTOCOCCUS PNEUMONIAE CULTURE REINCUBATED FOR BETTER GROWTH Performed at [facility], [place] [address] [place], [place] [address]    Report Status PENDING  Incomplete  Blood culture (routine x 2)     Status: None (Preliminary result)   Collection Time: 07/12/19  1:30 PM   Specimen: BLOOD  Result Value Ref Range Status   Specimen Description BLOOD RIGHT ANTECUBITAL  Final   Special Requests   Final    BOTTLES DRAWN AEROBIC AND ANAEROBIC Blood Culture adequate volume   Culture  Setup Time   Final    GRAM POSITIVE COCCI IN CHAINS IN BOTH AEROBIC AND ANAEROBIC BOTTLES CRITICAL RESULT CALLED TO, READ BACK BY AND VERIFIED WITHDaun Peacock, PHARMD, AT 1048 07/13/19 BY D. VANHOOK Performed at [facility], [place] [address], [place], [place]  [address]    Culture GRAM POSITIVE COCCI  Final   Report Status PENDING  Incomplete  Blood Culture ID Panel (Reflexed)     Status: Abnormal   Collection Time: 07/12/19  1:30 PM  Result Value Ref Range Status   Enterococcus species NOT DETECTED NOT DETECTED Final   Listeria monocytogenes NOT DETECTED NOT DETECTED Final   Staphylococcus species NOT DETECTED NOT DETECTED Final   Staphylococcus aureus (BCID) NOT DETECTED NOT DETECTED Final   Streptococcus species DETECTED (A) NOT DETECTED Final    Comment: CRITICAL RESULT CALLED TO, READ BACK BY AND VERIFIED WITH: PHARMD J FRAENS [id number] AT 847 AM BY CM    Streptococcus agalactiae NOT DETECTED NOT DETECTED Final   Streptococcus pneumoniae DETECTED (A) NOT DETECTED Final    Comment: CRITICAL RESULT CALLED TO, READ BACK BY AND VERIFIED WITH: PHARMD J FRAENS [id number] AT 847 AM BY CM    Streptococcus pyogenes NOT DETECTED NOT DETECTED Final    Acinetobacter baumannii NOT DETECTED NOT DETECTED Final   Enterobacteriaceae species NOT DETECTED NOT DETECTED Final   Enterobacter cloacae complex NOT DETECTED NOT DETECTED Final   Escherichia coli NOT DETECTED NOT DETECTED Final   Klebsiella oxytoca NOT DETECTED NOT DETECTED Final   Klebsiella pneumoniae NOT DETECTED NOT DETECTED Final   Proteus species NOT DETECTED NOT DETECTED Final   Serratia marcescens NOT DETECTED NOT DETECTED Final   Haemophilus influenzae NOT DETECTED NOT DETECTED Final   Neisseria meningitidis NOT DETECTED NOT DETECTED Final   Pseudomonas aeruginosa NOT DETECTED NOT DETECTED Final   Candida albicans NOT DETECTED NOT DETECTED Final   Candida glabrata NOT DETECTED NOT DETECTED Final   Candida krusei NOT DETECTED NOT DETECTED Final   Candida parapsilosis NOT DETECTED NOT DETECTED Final   Candida tropicalis NOT DETECTED NOT DETECTED Final    Comment: Performed at [facility], [address], [place], [place] [address]  MRSA PCR Screening     Status: None   Collection Time: 07/12/19 11:48 PM   Specimen: Nasal Mucosa; Nasopharyngeal  Result Value Ref Range Status   MRSA by PCR NEGATIVE NEGATIVE Final    Comment:        The GeneXpert MRSA Assay (FDA approved for NASAL specimens only), is one component of a comprehensive MRSA colonization surveillance program. It is not intended to diagnose MRSA infection nor to guide or monitor treatment for MRSA infections. Performed at [facility], [place] [address], [place],  [address]          Radiology Studies: Ct Angio Chest Pe W Or Wo Contrast  Result Date: 07/13/2019 CLINICAL DATA:  63 year old female with shortness of breath. EXAM: CT ANGIOGRAPHY CHEST WITH CONTRAST TECHNIQUE: Multidetector CT imaging of the chest was performed using the standard protocol during bolus administration of intravenous contrast. Multiplanar CT image reconstructions and MIPs were obtained to evaluate the vascular  anatomy. CONTRAST:  158mL OMNIPAQUE IOHEXOL 350 MG/ML SOLN COMPARISON:  Chest CT dated 02/05/2017. FINDINGS: Cardiovascular: There is mild cardiomegaly. No pericardial effusion. There is coronary vascular calcification primarily involving the LAD. Minimal atherosclerotic calcification of the aortic arch. No aneurysmal dilatation or dissection. The origins of the great vessels of the aortic arch appear patent as visualized. No large or central pulmonary artery embolus identified. There is intraluminal filling defect involving distal/subsegmental right upper lobe branch (axial series 6 images 136-142 and sagittal series 9, image 52). Additional similar tiny filling defect in a distal/subsegmental left upper lobe pulmonary artery branch (axial series  6 image 139). These may represent areas of scarring or sequela of prior PEs. Acute small subsegmental pulmonary artery emboli are not excluded. Clinical correlation is recommended. Mediastinum/Nodes: No hilar or mediastinal adenopathy. Evaluation however is limited due to consolidative changes of the lower lobes. The esophagus is grossly unremarkable. No mediastinal fluid collection. Lungs/Pleura: Large areas of consolidative changes involving the lower lobes bilaterally with air bronchograms most consistent with pneumonia. Clinical correlation and follow-up to solution recommended to exclude underlying mass. There is a segmental area of consolidation in the right middle lobe. Small bilateral pleural effusions noted. There is no pneumothorax. The central airways are patent. Upper Abdomen: No acute abnormality. Musculoskeletal: No acute osseous pathology. Degenerative changes of the spine. Review of the MIP images confirms the above findings. IMPRESSION: 1. No CT evidence of large or central pulmonary artery embolus. Small intraluminal filling defects involving the distal/subsegmental right upper lobe and left upper lobe pulmonary artery branches may represent areas of  scarring or sequela of prior PEs. Acute small subsegmental pulmonary artery emboli are not excluded. Clinical correlation is recommended. 2. Bilateral lower lobe and right middle lobe consolidative changes most consistent with pneumonia. Clinical correlation and follow-up to solution recommended. 3. Small bilateral pleural effusions. 4. Aortic Atherosclerosis (ICD10-I70.0). Electronically Signed   By: Anner Crete M.D.   On: 07/13/2019 14:30        Scheduled Meds: . influenza vac split quadrivalent PF  0.5 mL Intramuscular Tomorrow-1000  . LORazepam  0-4 mg Intravenous Q12H   Or  . LORazepam  0-4 mg Oral Q12H  . mouth rinse  15 mL Mouth Rinse BID  . mometasone-formoterol  2 puff Inhalation BID  . montelukast  10 mg Oral QHS  . thiamine  100 mg Oral Daily   Or  . thiamine  100 mg Intravenous Daily   Continuous Infusions: . sodium chloride    . cefTRIAXone (ROCEPHIN)  IV 200 mL/hr at 07/14/19 1600     LOS: 3 days     Cordelia Poche, MD [facility] 07/15/2019, 8:32 AM  If 7PM-7AM, please contact night-coverage [email or web address]

## 2019-07-15 NOTE — Progress Notes (Signed)
Modified Barium Swallow Progress Note  Patient Details  Name: Angel Hebert MRN: [id number] Date of Birth: 1956-06-02  Today's Date: 07/15/2019  Modified Barium Swallow completed.  Full report located under Chart Review in the Imaging Section.  Brief recommendations include the following:  Clinical Impression  Pt presented alert, cooperative, distractable, and in a pleasant mood. Oral care completed prior to and at conclusion of study to clear dried secretions on lower lip. Pt exhibited mild oropharyngeal dysphagia significant for prolonged mastication/delayed oral transit with regular solids, delay in swallow initiation to valleculae with nectar, and flash penetration due to slightly decreased epiglottic inversion with thins during large, consecutive sips.  No instances of aspiration during study although congested cough present at baseline, during study, and after 2/2 pneumonia. Pts impulsivity increases risk of aspiration given proclivity to take very large, consecutive sips via cup and straw. Regular solids required 2-3 minutes of mastication for 1x small bite suspect due to respiratory deficits (RR ranging from 32-41 during mastication), absence of dentures, and decreased endurance. Recommend diet adjustment to Dysphagia 2 (minced/chopped) with thin liquids (cup or straw), meds whole in puree. Full supervision to assist pt with self feeding and provide cueing/intervention for pt to take single sips. SLP will follow for diet toleration with goal of upgrading to D3.    Swallow Evaluation Recommendations       SLP Diet Recommendations: Dysphagia 2 (Fine chop) solids;Thin liquid   Liquid Administration via: Cup;Straw   Medication Administration: Whole meds with puree   Supervision: Staff to assist with self feeding;Full supervision/cueing for compensatory strategies   Compensations: Small sips/bites;Slow rate   Postural Changes: Seated upright at 90 degrees   Oral Care  Recommendations: Oral care QID;Staff/trained caregiver to provide oral care        [name] 07/15/2019,12:05 PM   [name].Ed Risk analyst [phone] Office [phone]

## 2019-07-15 NOTE — Consult Note (Signed)
[place] for Infectious Disease    Date of Admission:  07/12/2019   Total days of antibiotics 4        Day 2 of Ceftriaxone        Reason for Consult: Antibiotic Management of Strep pneumo bacteremia   Referring Provider: Dr. Teryl Lucy Primary Care Provider: Dr. Margarita Rana   Assessment: 1. Streptococcus pneumoniae Bacteremia - Patient presented with sepsis that has since resolved in the setting of Strep penumo bacteremia due to multifocal pneumonia. Initial blood cultures positive for pan-sensitive Streptococcus pneumoniae and received 2 days of IV therapy with vancomycin and cefepime and then switched to IV Ceftriaxone. CXR showed bibasilar infiltrates consistent with multifocal pneumonia. Patient is negative for HIV and TTE showed no sign of endocarditis. On day 4 of antibiotic therapy and tolerating them well.   Plan: 1. Continue IV Ceftriaxone, but does not have a history of PCN allergy and may benefit from switching to PcnG.  2. Likely switch to oral antibiotics with a total course of 10 to 14 days once blood cultures are negative.  3. Repeat blood cultures pending  Principal Problem:   Symptomatic anemia Active Problems:   Hyperlipidemia   Essential hypertension   GERD   Sepsis ([place])   Hypokalemia   Lobar pneumonia ([place])   . influenza vac split quadrivalent PF  0.5 mL Intramuscular Tomorrow-1000  . LORazepam  0-4 mg Intravenous Q12H   Or  . LORazepam  0-4 mg Oral Q12H  . mouth rinse  15 mL Mouth Rinse BID  . mometasone-formoterol  2 puff Inhalation BID  . montelukast  10 mg Oral QHS  . thiamine  100 mg Oral Daily    HPI: Angel Hebert is a 63 y.o. female with a pertinent PMH of HTN, GERD, asthma, osteoarthritis, who presented to [facility] on 11/09 with right flank pain, weakness, and shortness of breath. Patient was found to be febrile, hypotensive with a severe anemia. In the ED, the patient found to have a fever of 102.3, BP of 98/54, tachycardic at 125 bpm,  and tachypnic at 39 and saturating at 93% on 2 L supplemental oxygen. She had a leukocytosis of 38.1 with a lactic acid of 1.5. CXR showed bibasilar infiltrates Initial blood cultures were positive for pan-sensitive Strep pneumo. Patient admitted for sepsis 2/2 to multifocal pneumonia and started on empiric antibiotic therapy with vanco and cefepime and recently transitioned to Ceftriaxone. Patients cultures grew pan-sensitive strep pneumo.     Review of Systems: Review of Systems  Constitutional: Negative for chills, fever and malaise/fatigue.  Respiratory: Negative for shortness of breath.   Cardiovascular: Negative for chest pain.  Gastrointestinal: Negative for abdominal pain.  Genitourinary: Negative for dysuria.    Past Medical History:  Diagnosis Date  . Arthritis   . Asthma    last year in December  . Dyspnea   . GERD (gastroesophageal reflux disease)   . High cholesterol   . Hypertension   . Pneumonia 01/2017    Social History   Tobacco Use  . Smoking status: Never Smoker  . Smokeless tobacco: Never Used  Substance Use Topics  . Alcohol use: No    Comment: Quit years ago  . Drug use: No    Family History  Problem Relation Age of Onset  . Diabetes Sister   . Colon cancer Neg Hx   . Breast cancer Neg Hx    No Known Allergies  OBJECTIVE: Blood pressure (!) 144/88,  pulse 98, temperature 97.6 F (36.4 C), temperature source Oral, resp. rate (!) 22, height 5\' 7"  (1.702 m), weight 69.9 kg, SpO2 93 %.  Physical Exam Cardiovascular:     Rate and Rhythm: Normal rate and regular rhythm.     Heart sounds: No murmur. No friction rub. No gallop.   Pulmonary:     Effort: Pulmonary effort is normal. No respiratory distress.  Chest:     Chest wall: No tenderness.  Abdominal:     Palpations: Abdomen is soft.     Tenderness: There is no abdominal tenderness.  Musculoskeletal:     Right lower leg: She exhibits no tenderness. No edema.     Left lower leg: She exhibits  no tenderness.  Skin:    General: Skin is warm and dry.  Neurological:     General: No focal deficit present.     Mental Status: She is alert.     Lab Results Lab Results  Component Value Date   WBC 22.0 (H) 07/14/2019   HGB 8.8 (L) 07/14/2019   HCT 28.4 (L) 07/14/2019   MCV 77.8 (L) 07/14/2019   PLT 593 (H) 07/14/2019    Lab Results  Component Value Date   CREATININE 0.62 07/14/2019   BUN 12 07/14/2019   NA 145 07/14/2019   K 3.2 (L) 07/14/2019   CL 114 (H) 07/14/2019   CO2 19 (L) 07/14/2019    Lab Results  Component Value Date   ALT 18 07/14/2019   AST 33 07/14/2019   ALKPHOS 79 07/14/2019   BILITOT 1.4 (H) 07/14/2019     Microbiology: Recent Results (from the past 240 hour(s))  SARS CORONAVIRUS 2 (TAT 6-24 HRS) Nasopharyngeal Nasopharyngeal Swab     Status: None   Collection Time: 07/12/19  1:25 PM   Specimen: Nasopharyngeal Swab  Result Value Ref Range Status   SARS Coronavirus 2 NEGATIVE NEGATIVE Final    Comment: (NOTE) SARS-CoV-2 target nucleic acids are NOT DETECTED. The SARS-CoV-2 RNA is generally detectable in upper and lower respiratory specimens during the acute phase of infection. Negative results do not preclude SARS-CoV-2 infection, do not rule out co-infections with other pathogens, and should not be used as the sole basis for treatment or other patient management decisions. Negative results must be combined with clinical observations, patient history, and epidemiological information. The expected result is Negative. Fact Sheet for Patients: SugarRoll.be Fact Sheet for Healthcare Providers: https://www.woods-mathews.com/ This test is not yet approved or cleared by the [place] FDA and  has been authorized for detection and/or diagnosis of SARS-CoV-2 by FDA under an Emergency Use Authorization (EUA). This EUA will remain  in effect (meaning this test can be used) for the duration of the COVID-19  declaration under Section 56 4(b)(1) of the Act, 21 U.S.C. section 360bbb-3(b)(1), unless the authorization is terminated or revoked sooner. Performed at [facility], [place] [address] [place], [place] [address]   Blood culture (routine x 2)     Status: Abnormal   Collection Time: 07/12/19  1:30 PM   Specimen: BLOOD  Result Value Ref Range Status   Specimen Description BLOOD LEFT ANTECUBITAL  Final   Special Requests   Final    BOTTLES DRAWN AEROBIC AND ANAEROBIC Blood Culture adequate volume   Culture  Setup Time   Final    GRAM POSITIVE COCCI IN CHAINS AEROBIC BOTTLE ONLY CRITICAL RESULT CALLED TO, READ BACK BY AND VERIFIED WITH: [place] [id number] AM  Culture (A)  Final    STREPTOCOCCUS PNEUMONIAE SUSCEPTIBILITIES PERFORMED ON PREVIOUS CULTURE WITHIN THE LAST 5 DAYS. Performed at [facility], [place] [address] [place], [place] [address]    Report Status 07/15/2019 FINAL  Final  Blood culture (routine x 2)     Status: Abnormal   Collection Time: 07/12/19  1:30 PM   Specimen: BLOOD  Result Value Ref Range Status   Specimen Description BLOOD RIGHT ANTECUBITAL  Final   Special Requests   Final    BOTTLES DRAWN AEROBIC AND ANAEROBIC Blood Culture adequate volume   Culture  Setup Time   Final    GRAM POSITIVE COCCI IN CHAINS IN BOTH AEROBIC AND ANAEROBIC BOTTLES CRITICAL RESULT CALLED TO, READ BACK BY AND VERIFIED WITHDaun Peacock, PHARMD, AT 1048 07/13/19 BY D. VANHOOK Performed at [facility], [place] [address], [place], [place] [address]    Culture STREPTOCOCCUS PNEUMONIAE (A)  Final   Report Status 07/15/2019 FINAL  Final   Organism ID, Bacteria STREPTOCOCCUS PNEUMONIAE  Final      Susceptibility   Streptococcus pneumoniae - MIC*    ERYTHROMYCIN <=0.12 SENSITIVE Sensitive     LEVOFLOXACIN 1 SENSITIVE Sensitive     VANCOMYCIN 0.25 SENSITIVE Sensitive     PENICILLIN (non-meningitis) <=0.06 SENSITIVE Sensitive     CEFTRIAXONE (non-meningitis)  <=0.12 SENSITIVE Sensitive     * STREPTOCOCCUS PNEUMONIAE  Blood Culture ID Panel (Reflexed)     Status: Abnormal   Collection Time: 07/12/19  1:30 PM  Result Value Ref Range Status   Enterococcus species NOT DETECTED NOT DETECTED Final   Listeria monocytogenes NOT DETECTED NOT DETECTED Final   Staphylococcus species NOT DETECTED NOT DETECTED Final   Staphylococcus aureus (BCID) NOT DETECTED NOT DETECTED Final   Streptococcus species DETECTED (A) NOT DETECTED Final    Comment: CRITICAL RESULT CALLED TO, READ BACK BY AND VERIFIED WITH: PHARMD J FRAENS [id number] AT 847 AM BY CM    Streptococcus agalactiae NOT DETECTED NOT DETECTED Final   Streptococcus pneumoniae DETECTED (A) NOT DETECTED Final    Comment: CRITICAL RESULT CALLED TO, READ BACK BY AND VERIFIED WITH: PHARMD J FRAENS [id number] AT 847 AM BY CM    Streptococcus pyogenes NOT DETECTED NOT DETECTED Final   Acinetobacter baumannii NOT DETECTED NOT DETECTED Final   Enterobacteriaceae species NOT DETECTED NOT DETECTED Final   Enterobacter cloacae complex NOT DETECTED NOT DETECTED Final   Escherichia coli NOT DETECTED NOT DETECTED Final   Klebsiella oxytoca NOT DETECTED NOT DETECTED Final   Klebsiella pneumoniae NOT DETECTED NOT DETECTED Final   Proteus species NOT DETECTED NOT DETECTED Final   Serratia marcescens NOT DETECTED NOT DETECTED Final   Haemophilus influenzae NOT DETECTED NOT DETECTED Final   Neisseria meningitidis NOT DETECTED NOT DETECTED Final   Pseudomonas aeruginosa NOT DETECTED NOT DETECTED Final   Candida albicans NOT DETECTED NOT DETECTED Final   Candida glabrata NOT DETECTED NOT DETECTED Final   Candida krusei NOT DETECTED NOT DETECTED Final   Candida parapsilosis NOT DETECTED NOT DETECTED Final   Candida tropicalis NOT DETECTED NOT DETECTED Final    Comment: Performed at [facility], [address], [place], [place] [address]  MRSA PCR Screening     Status: None   Collection Time: 07/12/19 11:48 PM    Specimen: Nasal Mucosa; Nasopharyngeal  Result Value Ref Range Status   MRSA by PCR NEGATIVE NEGATIVE Final    Comment:        The  GeneXpert MRSA Assay (FDA approved for NASAL specimens only), is one component of a comprehensive MRSA colonization surveillance program. It is not intended to diagnose MRSA infection nor to guide or monitor treatment for MRSA infections. Performed at [facility], [place] [address] [place], [place] [address]     Marianna Payment, D.O. Date 07/15/2019 Time 9:45 AM [facility] Internal Medicine, PGY-1 Pager: [phone]   07/15/2019 9:45 AM

## 2019-07-16 ENCOUNTER — Inpatient Hospital Stay (HOSPITAL_COMMUNITY): Payer: Medicaid Other

## 2019-07-16 DIAGNOSIS — D72829 Elevated white blood cell count, unspecified: Secondary | ICD-10-CM

## 2019-07-16 LAB — CBC
HCT: 27.2 % — ABNORMAL LOW (ref 36.0–46.0)
Hemoglobin: 8.5 g/dL — ABNORMAL LOW (ref 12.0–15.0)
MCH: 24.6 pg — ABNORMAL LOW (ref 26.0–34.0)
MCHC: 31.3 g/dL (ref 30.0–36.0)
MCV: 78.8 fL — ABNORMAL LOW (ref 80.0–100.0)
Platelets: 819 10*3/uL — ABNORMAL HIGH (ref 150–400)
RBC: 3.45 MIL/uL — ABNORMAL LOW (ref 3.87–5.11)
RDW: 26.5 % — ABNORMAL HIGH (ref 11.5–15.5)
WBC: 15.4 10*3/uL — ABNORMAL HIGH (ref 4.0–10.5)
nRBC: 1 % — ABNORMAL HIGH (ref 0.0–0.2)

## 2019-07-16 LAB — BASIC METABOLIC PANEL
Anion gap: 12 (ref 5–15)
BUN: <5 mg/dL — ABNORMAL LOW (ref 8–23)
CO2: 24 mmol/L (ref 22–32)
Calcium: 7.8 mg/dL — ABNORMAL LOW (ref 8.9–10.3)
Chloride: 102 mmol/L (ref 98–111)
Creatinine, Ser: 0.54 mg/dL (ref 0.44–1.00)
GFR calc Af Amer: >60 mL/min (ref >60)
GFR calc non Af Amer: >60 mL/min (ref >60)
Glucose, Bld: 137 mg/dL — ABNORMAL HIGH (ref 70–99)
Potassium: 2.7 mmol/L — CL (ref 3.5–5.1)
Sodium: 138 mmol/L (ref 135–145)

## 2019-07-16 MED ORDER — FUROSEMIDE 10 MG/ML IJ SOLN
40.0000 mg | Freq: Two times a day (BID) | INTRAMUSCULAR | Status: DC
  Administered 2019-07-16 – 2019-07-17 (×4): 40 mg via INTRAVENOUS
  Filled 2019-07-16 (×4): qty 4

## 2019-07-16 MED ORDER — POTASSIUM CHLORIDE CRYS ER 20 MEQ PO TBCR
40.0000 meq | EXTENDED_RELEASE_TABLET | ORAL | Status: AC
  Administered 2019-07-16 (×2): 40 meq via ORAL
  Filled 2019-07-16 (×2): qty 2

## 2019-07-16 MED ORDER — BENZONATATE 100 MG PO CAPS
200.0000 mg | ORAL_CAPSULE | Freq: Three times a day (TID) | ORAL | Status: DC | PRN
  Administered 2019-07-16: 200 mg via ORAL
  Filled 2019-07-16: qty 2

## 2019-07-16 NOTE — Progress Notes (Signed)
  Speech Language Pathology Treatment: Dysphagia  Patient Details Name: Angel Hebert MRN: [id number] DOB: 10-09-1955 Today's Date: 07/16/2019 Time: [id number] SLP Time Calculation (min) (ACUTE ONLY): 9 min  Assessment / Plan / Recommendation Clinical Impression  Pt was seen for skilled ST targeting dysphagia goals.  Therapist facilitated the session with a functional snack of dys 2 textures and thin liquids to assess toleration of current diet.  Pt demonstrated no overt s/s of aspiration with solids or liquids and her oral phase was efficient for containing and clearing boluses from the oral cavity.  PO intake is limited as pt reports feeling unwell which pt states is different from her normal.  For now, recommend that pt remain on her currently prescribed diet.  Pt was left in bed with bed alarm set and call bell within reach.  Continue per current plan of care.    HPI HPI: Angel Hebert is a 63 y.o. with hx of hypertension, GERD, asthma, osteoarthritis presented with right flank pain on 07/12/2019.  She was found to be hypotensive with leukocytosis and hemoglobin of 5.2.  CT abdomen and pelvis showed no acute findings but showed multifocal airspace consolidation throughout lung bases compatible with severe multilobar pneumonia along with subacute compression fracture of the superior endplate of the L3. Latest chest CT 11/10 showed Bilateral lower lobe and right middle lobe consolidative changes most consistent with pneumonia, small bilateral pleural effusions, and aortic atherosclerosis. Diet currently reg/thin.       SLP Plan  Continue with current plan of care       Recommendations  Diet recommendations: Dysphagia 2 (fine chop);Thin liquid Liquids provided via: Cup Medication Administration: Crushed with puree Supervision: Patient able to self feed;Intermittent supervision to cue for compensatory strategies Compensations: Small sips/bites;Slow rate Postural Changes and/or  Swallow Maneuvers: Seated upright 90 degrees                Oral Care Recommendations: Oral care BID Follow up Recommendations: Skilled Nursing facility;24 hour supervision/assistance SLP Visit Diagnosis: Dysphagia, pharyngeal phase (R13.13) Plan: Continue with current plan of care       GO                Krisinda Giovanni, Selinda Orion 07/16/2019, 12:09 PM

## 2019-07-16 NOTE — Evaluation (Addendum)
Physical Therapy Evaluation Patient Details Name: Angel Hebert MRN: [id number] DOB: 31-Jul-1956 Today's Date: 07/16/2019   History of Present Illness  Angel Hebert is a 63 y.o. female with history of hypertension, GERD, asthma, osteoarthritis. Patient presented secondary to right flank pain and found to have multifocal pneumonia and now strep pneumoniae bacteremia.  Clinical Impression  Pt admitted with above. Pt presents with decreased functional mobility secondary to decreased endurance, weakness, balance impairments, decreased cognition. Performing limited room ambulation with walker and min guard assist. SpO2 87-95% on RA, HR peak 117 bpm, RR up to 46 with mobility. Cues for pursed lip breathing. Prior to admission, pt lives with her boyfriend and states she is independent with ADL's. Will need HHPT to maximize functional independence; recommended use of walker for all mobility.      Follow Up Recommendations Home health PT;Supervision/Assistance - 24 hour    Equipment Recommendations  Rolling walker with 5" wheels    Recommendations for Other Services OT consult     Precautions / Restrictions Precautions Precautions: Fall Restrictions Weight Bearing Restrictions: No      Mobility  Bed Mobility Overal bed mobility: Modified Independent             General bed mobility comments: increased time/effort  Transfers Overall transfer level: Needs assistance Equipment used: Rolling walker (2 wheeled);None Transfers: Sit to/from [employer] to Stand: Min guard Stand pivot transfers: Min guard       General transfer comment: Min guard for safety, standing from edge of bed and pivoting onto BSC.   Ambulation/Gait Ambulation/Gait assistance: Min guard Gait Distance (Feet): 20 Feet Assistive device: Rolling walker (2 wheeled);None Gait Pattern/deviations: Step-through pattern;Decreased stride length Gait velocity: decreased Gait velocity  interpretation: <1.8 ft/sec, indicate of risk for recurrent falls General Gait Details: Pt initially reaching for external support, so provided walker, requiring min guard for stability. Slow speed with cues for direction/sequencing  Stairs            Wheelchair Mobility    Modified Rankin (Stroke Patients Only)       Balance Overall balance assessment: Needs assistance Sitting-balance support: Feet supported Sitting balance-Leahy Scale: Good     Standing balance support: During functional activity;No upper extremity supported Standing balance-Leahy Scale: Fair                               Pertinent Vitals/Pain Pain Assessment: Faces Faces Pain Scale: Hurts little more Pain Location: right flank Pain Descriptors / Indicators: Grimacing Pain Intervention(s): Monitored during session    Home Living Family/patient expects to be discharged to:: Private residence Living Arrangements: Spouse/significant other(boyfriend) Available Help at Discharge: Family;Available PRN/intermittently Type of Home: House Home Access: Ramped entrance     Home Layout: One level Home Equipment: Shower seat      Prior Function Level of Independence: Independent               Hand Dominance        Extremity/Trunk Assessment   Upper Extremity Assessment Upper Extremity Assessment: Generalized weakness    Lower Extremity Assessment Lower Extremity Assessment: Generalized weakness       Communication   Communication: No difficulties  Cognition Arousal/Alertness: Awake/alert Behavior During Therapy: WFL for tasks assessed/performed Overall Cognitive Status: Impaired/Different from baseline Area of Impairment: Orientation;Awareness;Safety/judgement                 Orientation Level: Disoriented to;Time  Safety/Judgement: Decreased awareness of deficits Awareness: Emergent   General Comments: Pt A&Ox2, not oriented to time, stating month was  August. When PT asked her how the session went, pt states, "I think I did good."       General Comments      Exercises     Assessment/Plan    PT Assessment Patient needs continued PT services  PT Problem List Decreased strength;Decreased activity tolerance;Decreased balance;Decreased mobility;Decreased cognition;Decreased safety awareness;Cardiopulmonary status limiting activity       PT Treatment Interventions DME instruction;Gait training;Functional mobility training;Therapeutic activities;Balance training;Therapeutic exercise;Patient/family education    PT Goals (Current goals can be found in the Care Plan section)  Acute Rehab PT Goals Patient Stated Goal: "go home." PT Goal Formulation: With patient Time For Goal Achievement: 07/30/19 Potential to Achieve Goals: Good    Frequency Min 3X/week   Barriers to discharge        Co-evaluation               AM-PAC PT "6 Clicks" Mobility  Outcome Measure Help needed turning from your back to your side while in a flat bed without using bedrails?: None Help needed moving from lying on your back to sitting on the side of a flat bed without using bedrails?: None Help needed moving to and from a bed to a chair (including a wheelchair)?: A Little Help needed standing up from a chair using your arms (e.g., wheelchair or bedside chair)?: A Little Help needed to walk in hospital room?: A Little Help needed climbing 3-5 steps with a railing? : A Lot 6 Click Score: 19    End of Session   Activity Tolerance: Patient tolerated treatment well Patient left: in bed;with call bell/phone within reach;with bed alarm set Nurse Communication: Mobility status PT Visit Diagnosis: Difficulty in walking, not elsewhere classified (R26.2);Muscle weakness (generalized) (M62.81)    Time: [id number] PT Time Calculation (min) (ACUTE ONLY): 18 min   Charges:   PT Evaluation $PT Eval Moderate Complexity: 1 Mod          Ellamae Sia,  [place], DPT Acute Rehabilitation Services Pager [phone] Office [phone]   Willy Eddy 07/16/2019, 11:40 AM

## 2019-07-16 NOTE — Progress Notes (Signed)
.  CRITICAL VALUE ALERT  Critical Value:  K+2.7  Date & Time Notied: 07/16/19 1141  Provider Notified: Dr. Teryl Lucy  Orders Received/Actions taken: New orders placed by Dr. Teryl Lucy, and we will continue to monitor

## 2019-07-16 NOTE — Progress Notes (Signed)
PROGRESS NOTE    Angel Hebert  [id number] DOB: 06/12/1956 DOA: 07/12/2019 PCP: Charlott Rakes, MD   Brief Narrative: Angel Hebert is a 63 y.o. female with history of hypertension, GERD, asthma, osteoarthritis. Patient presented secondary to right flank pain and found to have multifocal pneumonia and now strep pneumoniae bacteremia.   Assessment & Plan:   Principal Problem:   Symptomatic anemia Active Problems:   Hyperlipidemia   Essential hypertension   GERD   Sepsis ([place])   Hypokalemia   Lobar pneumonia ([place])   Strep bacteremia Patient transitioned to Ceftriaxone. Transthoracic Echocardiogram not suggestive of vegetations. -Await final culture results -Continue Ceftriaxone -ID recommendations: Penicillin G IV  Symptomatic anemia Baseline hemoglobin difficult to quantify but possible baseline is between 9-10. Hemoglobin of 5.2 on admission and is improved s/p 2 units of PRBC  Multifocal pneumonia Strep pneumo positive. Emprically treated with Vancomycin and Cefepime, transitioned to Ceftriaxone and now to Penicillin G. Patient continues to have tachypnea and oxygen requirement -Continue Penicillin per ID recommendations -Chest x-ray today  Acute respiratory failure with hypoxia Secondary to pneumonia.  Confusion Unsure of etiology. Possibly related to alcohol withdrawal although symptoms are not very significant. Will discuss patient's baseline with family if able.  Abdominal distension Improved with bowel movement.  Sepsis Present on admission. Secondary to above. Resolved.  Leukocytosis Secondary to bacteremia/pneumonia. Improving -CBC pending today  Thrombocytosis Likely secondary to bacteremia. Anticipate improvement with treatment.  Hypoalbuminemia Likely secondary to poor nutrition. Dietician consult.  Hypokalemia Replete as needed. BMP pending today  Hypertension Slight uncontrolled. On Hyzaar as an outpatient.  Hyperlipidemia   On Lipitor as an outpatient  GERD On Protonix as an outpatient  L3 compression fracture Subacute. -Pain management    DVT prophylaxis: SCDs Code Status:   Code Status: DNR Family Communication: None (discussion with sister on 11/12) Disposition Plan: Discharge pending antibiotic regimen recommendations and resolution of hypoxia   Consultants:   None  Procedures:  11/10: Transthoracic Echocardiogram   IMPRESSIONS    1. Left ventricular ejection fraction, by visual estimation, is 45 to 50%. The left ventricle has normal function. There is no left ventricular hypertrophy.  2. Left ventricular diastolic parameters are consistent with Grade I diastolic dysfunction (impaired relaxation).  3. The left ventricle demonstrates global hypokinesis.  4. Global right ventricle has normal systolic function.The right ventricular size is normal. No increase in right ventricular wall thickness.  5. Left atrial size was normal.  6. Right atrial size was normal.  7. Trivial pericardial effusion is present.  8. The mitral valve is normal in structure. Mild mitral valve regurgitation. No evidence of mitral stenosis.  9. The tricuspid valve is normal in structure. Tricuspid valve regurgitation is trivial. 10. The aortic valve is tricuspid. Aortic valve regurgitation is not visualized. Mild aortic valve sclerosis without stenosis. 11. TR signal is inadequate for assessing pulmonary artery systolic pressure. 12. The inferior vena cava is dilated in size with >50% respiratory variability, suggesting right atrial pressure of 8 mmHg.  FINDINGS  Left Ventricle: Left ventricular ejection fraction, by visual estimation, is 45 to 50%. The left ventricle has normal function. The left ventricle demonstrates global hypokinesis. There is no left ventricular hypertrophy. Left ventricular diastolic  parameters are consistent with Grade I diastolic dysfunction (impaired relaxation).  Right Ventricle: The  right ventricular size is normal. No increase in right ventricular wall thickness. Global RV systolic function is has normal systolic function.  Left Atrium: Left atrial size was normal in size.  Right Atrium: Right atrial size was normal in size  Pericardium: Trivial pericardial effusion is present.  Mitral Valve: The mitral valve is normal in structure. No evidence of mitral valve stenosis by observation. Mild mitral valve regurgitation.  Tricuspid Valve: The tricuspid valve is normal in structure. Tricuspid valve regurgitation is trivial.  Aortic Valve: The aortic valve is tricuspid. Aortic valve regurgitation is not visualized. Mild aortic valve sclerosis is present, with no evidence of aortic valve stenosis.  Pulmonic Valve: The pulmonic valve was normal in structure. Pulmonic valve regurgitation is trivial.  Aorta: The aortic root is normal in size and structure.  Venous: The inferior vena cava is dilated in size with greater than 50% respiratory variability, suggesting right atrial pressure of 8 mmHg.  IAS/Shunts: No atrial level shunt detected by color flow Doppler.  Antimicrobials:  Vancomycin  Cefepime  Ceftriaxone    Subjective: Some dyspnea. No other concerns. Wants to go home.  Objective: Vitals:   07/15/19 2320 07/16/19 0356 07/16/19 0756 07/16/19 0812  BP: 129/82 139/84 (!) 140/96   Pulse: 91  99   Resp: (!) 34 (!) 33 (!) 27   Temp: (!) 97.5 F (36.4 C) (!) 97.5 F (36.4 C) 97.6 F (36.4 C)   TempSrc: Oral  Oral   SpO2: 99% 98% 100% 100%  Weight:      Height:        Intake/Output Summary (Last 24 hours) at 07/16/2019 0921 Last data filed at 07/16/2019 0400 Gross per 24 hour  Intake 1141.7 ml  Output 250 ml  Net 891.7 ml   Filed Weights   07/12/19 2120 07/14/19 0326 07/15/19 0432  Weight: 66.6 kg 67.7 kg 69.9 kg    Examination:  General exam: Appears calm and comfortable Respiratory system: Diminished. Respiratory effort  normal. Cardiovascular system: S1 & S2 heard, RRR. No murmurs, rubs, gallops or clicks. Gastrointestinal system: Abdomen is nondistended, soft and nontender. No organomegaly or masses felt. Normal bowel sounds heard. Central nervous system: Alert and oriented. No focal neurological deficits. Extremities: No edema. No calf tenderness Skin: No cyanosis. No rashes Psychiatry: Judgement and insight appear normal. Mood & affect appropriate.     Data Reviewed: I have personally reviewed following labs and imaging studies  CBC: Recent Labs  Lab 07/12/19 1326 07/13/19 0806 07/14/19 0240 07/15/19 1329  WBC 38.1* 29.6* 22.0* 17.4*  NEUTROABS 36.6* 25.8* 18.3*  --   HGB 5.2* 8.3* 8.8* 9.0*  HCT 17.3* 25.9* 28.4* 28.3*  MCV 73.3* 75.5* 77.8* 76.3*  PLT 590* 620* 593* [id number]*   Basic Metabolic Panel: Recent Labs  Lab 07/12/19 1323 07/12/19 1326 07/13/19 0636 07/13/19 0806 07/14/19 0240 07/15/19 1329  NA  --  135 142 146* 145 141  K  --  2.0* 2.9* 2.9* 3.2* 2.8*  CL  --  101 112* 114* 114* 108  CO2  --  20* 20* 20* 19* 22  GLUCOSE  --  147* 122* 120* 86 92  BUN  --  20 14 14 12  6*  CREATININE  --  0.85 0.70 0.49 0.62 0.53  CALCIUM  --  8.7* 8.2* 8.4* 8.4* 8.3*  MG 2.0  --   --  1.9 1.7  --    GFR: Estimated Creatinine Clearance: 70.9 mL/min (by C-G formula based on SCr of 0.53 mg/dL). Liver Function Tests: Recent Labs  Lab 07/12/19 1326 07/13/19 0806 07/14/19 0240  AST 70* 47* 33  ALT 21 18 18   ALKPHOS 83 79 79  BILITOT 1.9* 3.1*  1.4*  PROT 7.6 6.9 7.1  ALBUMIN 2.3* 1.9* 2.0*   Recent Labs  Lab 07/12/19 1326  LIPASE 286*   No results for input(s): AMMONIA in the last 168 hours. Coagulation Profile: No results for input(s): INR, PROTIME in the last 168 hours. Cardiac Enzymes: No results for input(s): CKTOTAL, CKMB, CKMBINDEX, TROPONINI in the last 168 hours. BNP (last 3 results) No results for input(s): PROBNP in the last 8760 hours. HbA1C: No results for  input(s): HGBA1C in the last 72 hours. CBG: No results for input(s): GLUCAP in the last 168 hours. Lipid Profile: No results for input(s): CHOL, HDL, LDLCALC, TRIG, CHOLHDL, LDLDIRECT in the last 72 hours. Thyroid Function Tests: No results for input(s): TSH, T4TOTAL, FREET4, T3FREE, THYROIDAB in the last 72 hours. Anemia Panel: No results for input(s): VITAMINB12, FOLATE, FERRITIN, TIBC, IRON, RETICCTPCT in the last 72 hours. Sepsis Labs: Recent Labs  Lab 07/12/19 1524 07/12/19 2114 07/13/19 0806 07/14/19 0240 07/15/19 0324  PROCALCITON  --   --  4.82 3.53 1.68  LATICACIDVEN 1.8 1.5  --   --   --     Recent Results (from the past 240 hour(s))  SARS CORONAVIRUS 2 (TAT 6-24 HRS) Nasopharyngeal Nasopharyngeal Swab     Status: None   Collection Time: 07/12/19  1:25 PM   Specimen: Nasopharyngeal Swab  Result Value Ref Range Status   SARS Coronavirus 2 NEGATIVE NEGATIVE Final    Comment: (NOTE) SARS-CoV-2 target nucleic acids are NOT DETECTED. The SARS-CoV-2 RNA is generally detectable in upper and lower respiratory specimens during the acute phase of infection. Negative results do not preclude SARS-CoV-2 infection, do not rule out co-infections with other pathogens, and should not be used as the sole basis for treatment or other patient management decisions. Negative results must be combined with clinical observations, patient history, and epidemiological information. The expected result is Negative. Fact Sheet for Patients: SugarRoll.be Fact Sheet for Healthcare Providers: https://www.woods-mathews.com/ This test is not yet approved or cleared by the [place] FDA and  has been authorized for detection and/or diagnosis of SARS-CoV-2 by FDA under an Emergency Use Authorization (EUA). This EUA will remain  in effect (meaning this test can be used) for the duration of the COVID-19 declaration under Section 56 4(b)(1) of the Act, 21  U.S.C. section 360bbb-3(b)(1), unless the authorization is terminated or revoked sooner. Performed at [facility], [place] [address], [place], [place] [address]   Blood culture (routine x 2)     Status: Abnormal   Collection Time: 07/12/19  1:30 PM   Specimen: BLOOD  Result Value Ref Range Status   Specimen Description BLOOD LEFT ANTECUBITAL  Final   Special Requests   Final    BOTTLES DRAWN AEROBIC AND ANAEROBIC Blood Culture adequate volume   Culture  Setup Time   Final    GRAM POSITIVE COCCI IN CHAINS AEROBIC BOTTLE ONLY CRITICAL RESULT CALLED TO, READ BACK BY AND VERIFIED WITH: PHARMD J FRAENS [id number] AT 90 AM    Culture (A)  Final    STREPTOCOCCUS PNEUMONIAE SUSCEPTIBILITIES PERFORMED ON PREVIOUS CULTURE WITHIN THE LAST 5 DAYS. Performed at [facility], [place] [address], [place], [place] [address]    Report Status 07/15/2019 FINAL  Final  Blood culture (routine x 2)     Status: Abnormal   Collection Time: 07/12/19  1:30 PM   Specimen: BLOOD  Result Value Ref Range Status   Specimen Description BLOOD RIGHT ANTECUBITAL  Final   Special  Requests   Final    BOTTLES DRAWN AEROBIC AND ANAEROBIC Blood Culture adequate volume   Culture  Setup Time   Final    GRAM POSITIVE COCCI IN CHAINS IN BOTH AEROBIC AND ANAEROBIC BOTTLES CRITICAL RESULT CALLED TO, READ BACK BY AND VERIFIED WITHDaun Peacock, PHARMD, AT 1048 07/13/19 BY D. VANHOOK Performed at [facility], [place] [address] [place], [place] [address]    Culture STREPTOCOCCUS PNEUMONIAE (A)  Final   Report Status 07/15/2019 FINAL  Final   Organism ID, Bacteria STREPTOCOCCUS PNEUMONIAE  Final      Susceptibility   Streptococcus pneumoniae - MIC*    ERYTHROMYCIN <=0.12 SENSITIVE Sensitive     LEVOFLOXACIN 1 SENSITIVE Sensitive     VANCOMYCIN 0.25 SENSITIVE Sensitive     PENICILLIN (non-meningitis) <=0.06 SENSITIVE Sensitive     CEFTRIAXONE (non-meningitis) <=0.12 SENSITIVE Sensitive     * STREPTOCOCCUS  PNEUMONIAE  Blood Culture ID Panel (Reflexed)     Status: Abnormal   Collection Time: 07/12/19  1:30 PM  Result Value Ref Range Status   Enterococcus species NOT DETECTED NOT DETECTED Final   Listeria monocytogenes NOT DETECTED NOT DETECTED Final   Staphylococcus species NOT DETECTED NOT DETECTED Final   Staphylococcus aureus (BCID) NOT DETECTED NOT DETECTED Final   Streptococcus species DETECTED (A) NOT DETECTED Final    Comment: CRITICAL RESULT CALLED TO, READ BACK BY AND VERIFIED WITH: PHARMD J FRAENS [id number] AT 847 AM BY CM    Streptococcus agalactiae NOT DETECTED NOT DETECTED Final   Streptococcus pneumoniae DETECTED (A) NOT DETECTED Final    Comment: CRITICAL RESULT CALLED TO, READ BACK BY AND VERIFIED WITH: PHARMD J FRAENS [id number] AT 847 AM BY CM    Streptococcus pyogenes NOT DETECTED NOT DETECTED Final   Acinetobacter baumannii NOT DETECTED NOT DETECTED Final   Enterobacteriaceae species NOT DETECTED NOT DETECTED Final   Enterobacter cloacae complex NOT DETECTED NOT DETECTED Final   Escherichia coli NOT DETECTED NOT DETECTED Final   Klebsiella oxytoca NOT DETECTED NOT DETECTED Final   Klebsiella pneumoniae NOT DETECTED NOT DETECTED Final   Proteus species NOT DETECTED NOT DETECTED Final   Serratia marcescens NOT DETECTED NOT DETECTED Final   Haemophilus influenzae NOT DETECTED NOT DETECTED Final   Neisseria meningitidis NOT DETECTED NOT DETECTED Final   Pseudomonas aeruginosa NOT DETECTED NOT DETECTED Final   Candida albicans NOT DETECTED NOT DETECTED Final   Candida glabrata NOT DETECTED NOT DETECTED Final   Candida krusei NOT DETECTED NOT DETECTED Final   Candida parapsilosis NOT DETECTED NOT DETECTED Final   Candida tropicalis NOT DETECTED NOT DETECTED Final    Comment: Performed at [facility], [address] [place], [place] [address]  MRSA PCR Screening     Status: None   Collection Time: 07/12/19 11:48 PM   Specimen: Nasal Mucosa; Nasopharyngeal  Result  Value Ref Range Status   MRSA by PCR NEGATIVE NEGATIVE Final    Comment:        The GeneXpert MRSA Assay (FDA approved for NASAL specimens only), is one component of a comprehensive MRSA colonization surveillance program. It is not intended to diagnose MRSA infection nor to guide or monitor treatment for MRSA infections. Performed at [facility], [place] [address] [place], [place] [address]          Radiology Studies: Dg Swallowing Func-speech Pathology  Result Date: 07/15/2019 Objective Swallowing Evaluation: Type of Study: MBS-Modified Barium Swallow Study  Patient Details Name: Athenea  Nunamaker MRN: [id number] Date of Birth: 12-06-1955 Today's Date: 07/15/2019 Time: SLP Start Time (ACUTE ONLY): 1010 -SLP Stop Time (ACUTE ONLY): 1032 SLP Time Calculation (min) (ACUTE ONLY): 22 min Past Medical History: Past Medical History: Diagnosis Date  Arthritis   Asthma   last year in December  Dyspnea   GERD (gastroesophageal reflux disease)   High cholesterol   Hypertension   Pneumonia 01/2017 Past Surgical History: Past Surgical History: Procedure Laterality Date  ABDOMINAL HYSTERECTOMY    nasal polyps    removed just this past tuesday at [facility] facility over by [facility] Left 10/01/2016  Procedure: LEFT TOTAL KNEE ARTHROPLASTY;  Surgeon: Meredith Pel, MD;  Location: [facility];  Service: Orthopedics;  Laterality: Left; HPI: Afiya Everhart is a 63 y.o. with hx of hypertension, GERD, asthma, osteoarthritis presented with right flank pain on 07/12/2019.  She was found to be hypotensive with leukocytosis and hemoglobin of 5.2.  CT abdomen and pelvis showed no acute findings but showed multifocal airspace consolidation throughout lung bases compatible with severe multilobar pneumonia along with subacute compression fracture of the superior endplate of the L3. Latest chest CT 11/10 showed Bilateral lower lobe and right middle lobe consolidative changes most  consistent with pneumonia, small bilateral pleural effusions, and aortic atherosclerosis. Diet currently reg/thin.  No data recorded Assessment / Plan / Recommendation CHL IP CLINICAL IMPRESSIONS 07/15/2019 Clinical Impression Pt presented alert, cooperative, distractable, and in a pleasant mood. Oral care completed prior to and at conclusion of study to clear dried secretions on lower lip. Pt exhibited mild oropharyngeal dysphagia significant for prolonged mastication/delayed oral transit with regular solids, delay in swallow initiation to valleculae with nectar, and flash penetration due to slightly decreased epiglottic inversion with thins during large, consecutive sips.  No instances of aspiration during study although congested cough present at baseline, during study, and after 2/2 pneumonia. Pts impulsivity increases risk of aspiration given proclivity to take very large, consecutive sips via cup and straw. Regular solids required 2-3 minutes of mastication for 1x small bite suspect due to respiratory deficits (RR ranging from 32-41 during mastication), absence of dentures, and decreased endurance. Recommend diet adjustment to Dysphagia 2 (minced/chopped) with thin liquids (cup or straw), meds whole in puree. Full supervision to assist pt with self feeding and provide cueing/intervention for pt to take single sips. SLP will follow for diet toleration with goal of upgrading to D3.  SLP Visit Diagnosis Dysphagia, pharyngeal phase (R13.13) Attention and concentration deficit following -- Frontal lobe and executive function deficit following -- Impact on safety and function Mild aspiration risk   CHL IP TREATMENT RECOMMENDATION 07/15/2019 Treatment Recommendations Therapy as outlined in treatment plan below   Prognosis 07/15/2019 Prognosis for Safe Diet Advancement Fair Barriers to Reach Goals Cognitive deficits;Other (Comment) Barriers/Prognosis Comment -- CHL IP DIET RECOMMENDATION 07/15/2019 SLP Diet  Recommendations Dysphagia 2 (Fine chop) solids;Thin liquid Liquid Administration via Cup;Straw Medication Administration Whole meds with puree Compensations Small sips/bites;Slow rate Postural Changes Seated upright at 90 degrees   CHL IP OTHER RECOMMENDATIONS 07/15/2019 Recommended Consults -- Oral Care Recommendations Oral care QID;Staff/trained caregiver to provide oral care Other Recommendations --   CHL IP FOLLOW UP RECOMMENDATIONS 07/15/2019 Follow up Recommendations Skilled Nursing facility;24 hour supervision/assistance   CHL IP FREQUENCY AND DURATION 07/15/2019 Speech Therapy Frequency (ACUTE ONLY) min 1 x/week Treatment Duration 1 week      CHL IP ORAL PHASE 07/15/2019 Oral Phase Impaired Oral - Pudding Teaspoon -- Oral - Pudding  Cup -- Oral - Honey Teaspoon -- Oral - Honey Cup -- Oral - Nectar Teaspoon -- Oral - Nectar Cup WFL Oral - Nectar Straw -- Oral - Thin Teaspoon -- Oral - Thin Cup WFL Oral - Thin Straw WFL Oral - Puree -- Oral - Mech Soft -- Oral - Regular Delayed oral transit;Decreased bolus cohesion Oral - Multi-Consistency -- Oral - Pill -- Oral Phase - Comment --  CHL IP PHARYNGEAL PHASE 07/15/2019 Pharyngeal Phase Impaired Pharyngeal- Pudding Teaspoon -- Pharyngeal -- Pharyngeal- Pudding Cup -- Pharyngeal -- Pharyngeal- Honey Teaspoon -- Pharyngeal -- Pharyngeal- Honey Cup -- Pharyngeal -- Pharyngeal- Nectar Teaspoon -- Pharyngeal -- Pharyngeal- Nectar Cup Delayed swallow initiation-vallecula Pharyngeal -- Pharyngeal- Nectar Straw -- Pharyngeal -- Pharyngeal- Thin Teaspoon -- Pharyngeal -- Pharyngeal- Thin Cup Penetration/Aspiration during swallow;Reduced epiglottic inversion Pharyngeal Material enters airway, remains ABOVE vocal cords then ejected out Pharyngeal- Thin Straw Penetration/Aspiration during swallow;Reduced epiglottic inversion Pharyngeal Material enters airway, remains ABOVE vocal cords then ejected out Pharyngeal- Puree -- Pharyngeal -- Pharyngeal- Mechanical Soft -- Pharyngeal  -- Pharyngeal- Regular WFL Pharyngeal -- Pharyngeal- Multi-consistency -- Pharyngeal -- Pharyngeal- Pill -- Pharyngeal -- Pharyngeal Comment --  CHL IP CERVICAL ESOPHAGEAL PHASE 07/15/2019 Cervical Esophageal Phase WFL Pudding Teaspoon -- Pudding Cup -- Honey Teaspoon -- Honey Cup -- Nectar Teaspoon -- Nectar Cup -- Nectar Straw -- Thin Teaspoon -- Thin Cup -- Thin Straw -- Puree -- Mechanical Soft -- Regular -- Multi-consistency -- Pill -- Cervical Esophageal Comment -- [name] 07/15/2019, 12:04 PM [name].Ed Risk analyst [phone] Office [phone]                   Scheduled Meds:  influenza vac split quadrivalent PF  0.5 mL Intramuscular Tomorrow-1000   LORazepam  0-4 mg Intravenous Q12H   Or   LORazepam  0-4 mg Oral Q12H   mouth rinse  15 mL Mouth Rinse BID   mometasone-formoterol  2 puff Inhalation BID   montelukast  10 mg Oral QHS   thiamine  100 mg Oral Daily   Continuous Infusions:  sodium chloride     penicillin g continuous IV infusion 12 Million Units (07/16/19 0052)     LOS: 4 days     Cordelia Poche, MD [facility] 07/16/2019, 9:21 AM  If 7PM-7AM, please contact night-coverage [email or web address]

## 2019-07-16 NOTE — Progress Notes (Signed)
[facility] for Infectious Disease   Reason for visit: Follow up on pneumonia  Interval History: feels improved, WBC down to 15.4.  Remains on oxygen.  REpeat blood cultures with ngtd.  No associated rash or diarrhea.   Physical Exam: Constitutional:  Vitals:   07/16/19 0756 07/16/19 0812  BP: (!) 140/96   Pulse: 99   Resp: (!) 27   Temp: 97.6 F (36.4 C)   SpO2: 100% 100%   patient appears in NAD Respiratory: mild increase of RR after walking with PT; CTA B Cardiovascular: RRR GI: soft, nt, nd  Review of Systems: Constitutional: negative for fevers, chills and malaise Respiratory: negative for cough or sputum Gastrointestinal: negative for nausea and diarrhea  Lab Results  Component Value Date   WBC 15.4 (H) 07/16/2019   HGB 8.5 (L) 07/16/2019   HCT 27.2 (L) 07/16/2019   MCV 78.8 (L) 07/16/2019   PLT 819 (H) 07/16/2019    Lab Results  Component Value Date   CREATININE 0.54 07/16/2019   BUN <5 (L) 07/16/2019   NA 138 07/16/2019   K 2.7 (LL) 07/16/2019   CL 102 07/16/2019   CO2 24 07/16/2019    Lab Results  Component Value Date   ALT 18 07/14/2019   AST 33 07/14/2019   ALKPHOS 79 07/14/2019     Microbiology: Recent Results (from the past 240 hour(s))  SARS CORONAVIRUS 2 (TAT 6-24 HRS) Nasopharyngeal Nasopharyngeal Swab     Status: None   Collection Time: 07/12/19  1:25 PM   Specimen: Nasopharyngeal Swab  Result Value Ref Range Status   SARS Coronavirus 2 NEGATIVE NEGATIVE Final    Comment: (NOTE) SARS-CoV-2 target nucleic acids are NOT DETECTED. The SARS-CoV-2 RNA is generally detectable in upper and lower respiratory specimens during the acute phase of infection. Negative results do not preclude SARS-CoV-2 infection, do not rule out co-infections with other pathogens, and should not be used as the sole basis for treatment or other patient management decisions. Negative results must be combined with clinical observations, patient history, and  epidemiological information. The expected result is Negative. Fact Sheet for Patients: SugarRoll.be Fact Sheet for Healthcare Providers: https://www.woods-mathews.com/ This test is not yet approved or cleared by the [place] FDA and  has been authorized for detection and/or diagnosis of SARS-CoV-2 by FDA under an Emergency Use Authorization (EUA). This EUA will remain  in effect (meaning this test can be used) for the duration of the COVID-19 declaration under Section 56 4(b)(1) of the Act, 21 U.S.C. section 360bbb-3(b)(1), unless the authorization is terminated or revoked sooner. Performed at [facility], [place] [address] [place], [place] [address]   Blood culture (routine x 2)     Status: Abnormal   Collection Time: 07/12/19  1:30 PM   Specimen: BLOOD  Result Value Ref Range Status   Specimen Description BLOOD LEFT ANTECUBITAL  Final   Special Requests   Final    BOTTLES DRAWN AEROBIC AND ANAEROBIC Blood Culture adequate volume   Culture  Setup Time   Final    GRAM POSITIVE COCCI IN CHAINS AEROBIC BOTTLE ONLY CRITICAL RESULT CALLED TO, READ BACK BY AND VERIFIED WITH: PHARMD J FRAENS [id number] AT 46 AM    Culture (A)  Final    STREPTOCOCCUS PNEUMONIAE SUSCEPTIBILITIES PERFORMED ON PREVIOUS CULTURE WITHIN THE LAST 5 DAYS. Performed at [facility], [place] [address], [place], [place] [address]    Report Status 07/15/2019 FINAL  Final  Blood culture (  routine x 2)     Status: Abnormal   Collection Time: 07/12/19  1:30 PM   Specimen: BLOOD  Result Value Ref Range Status   Specimen Description BLOOD RIGHT ANTECUBITAL  Final   Special Requests   Final    BOTTLES DRAWN AEROBIC AND ANAEROBIC Blood Culture adequate volume   Culture  Setup Time   Final    GRAM POSITIVE COCCI IN CHAINS IN BOTH AEROBIC AND ANAEROBIC BOTTLES CRITICAL RESULT CALLED TO, READ BACK BY AND VERIFIED WITHDaun Peacock, [place], AT 1048 07/13/19 BY D. VANHOOK  Performed at [facility], [place] [address], [place], [place] [address]    Culture STREPTOCOCCUS PNEUMONIAE (A)  Final   Report Status 07/15/2019 FINAL  Final   Organism ID, Bacteria STREPTOCOCCUS PNEUMONIAE  Final      Susceptibility   Streptococcus pneumoniae - MIC*    ERYTHROMYCIN <=0.12 SENSITIVE Sensitive     LEVOFLOXACIN 1 SENSITIVE Sensitive     VANCOMYCIN 0.25 SENSITIVE Sensitive     PENICILLIN (non-meningitis) <=0.06 SENSITIVE Sensitive     CEFTRIAXONE (non-meningitis) <=0.12 SENSITIVE Sensitive     * STREPTOCOCCUS PNEUMONIAE  Blood Culture ID Panel (Reflexed)     Status: Abnormal   Collection Time: 07/12/19  1:30 PM  Result Value Ref Range Status   Enterococcus species NOT DETECTED NOT DETECTED Final   Listeria monocytogenes NOT DETECTED NOT DETECTED Final   Staphylococcus species NOT DETECTED NOT DETECTED Final   Staphylococcus aureus (BCID) NOT DETECTED NOT DETECTED Final   Streptococcus species DETECTED (A) NOT DETECTED Final    Comment: CRITICAL RESULT CALLED TO, READ BACK BY AND VERIFIED WITH: PHARMD J FRAENS [id number] AT 847 AM BY CM    Streptococcus agalactiae NOT DETECTED NOT DETECTED Final   Streptococcus pneumoniae DETECTED (A) NOT DETECTED Final    Comment: CRITICAL RESULT CALLED TO, READ BACK BY AND VERIFIED WITH: PHARMD J FRAENS [id number] AT 847 AM BY CM    Streptococcus pyogenes NOT DETECTED NOT DETECTED Final   Acinetobacter baumannii NOT DETECTED NOT DETECTED Final   Enterobacteriaceae species NOT DETECTED NOT DETECTED Final   Enterobacter cloacae complex NOT DETECTED NOT DETECTED Final   Escherichia coli NOT DETECTED NOT DETECTED Final   Klebsiella oxytoca NOT DETECTED NOT DETECTED Final   Klebsiella pneumoniae NOT DETECTED NOT DETECTED Final   Proteus species NOT DETECTED NOT DETECTED Final   Serratia marcescens NOT DETECTED NOT DETECTED Final   Haemophilus influenzae NOT DETECTED NOT DETECTED Final   Neisseria meningitidis NOT DETECTED NOT DETECTED  Final   Pseudomonas aeruginosa NOT DETECTED NOT DETECTED Final   Candida albicans NOT DETECTED NOT DETECTED Final   Candida glabrata NOT DETECTED NOT DETECTED Final   Candida krusei NOT DETECTED NOT DETECTED Final   Candida parapsilosis NOT DETECTED NOT DETECTED Final   Candida tropicalis NOT DETECTED NOT DETECTED Final    Comment: Performed at [facility], [address] [place],  [address]  MRSA PCR Screening     Status: None   Collection Time: 07/12/19 11:48 PM   Specimen: Nasal Mucosa; Nasopharyngeal  Result Value Ref Range Status   MRSA by PCR NEGATIVE NEGATIVE Final    Comment:        The GeneXpert MRSA Assay (FDA approved for NASAL specimens only), is one component of a comprehensive MRSA colonization surveillance program. It is not intended to diagnose MRSA infection nor to guide or monitor treatment for MRSA infections. Performed at [facility], [place]  [address], [place], [place] [address]     Impression/Plan:  1. Pneumonia - sensitive Strep pneumonia.  Improving.  Continue with IV penicillin and when at discharge, would continue with amoxicillin 500 mg tid for an additional 5 days.    2. Leukocytosis - much improved.  Continues to trend down.    I will sign off, call with any questions.

## 2019-07-17 ENCOUNTER — Inpatient Hospital Stay (HOSPITAL_COMMUNITY): Payer: Medicaid Other

## 2019-07-17 LAB — BASIC METABOLIC PANEL
Anion gap: 12 (ref 5–15)
BUN: <5 mg/dL — ABNORMAL LOW (ref 8–23)
CO2: 29 mmol/L (ref 22–32)
Calcium: 7.5 mg/dL — ABNORMAL LOW (ref 8.9–10.3)
Chloride: 99 mmol/L (ref 98–111)
Creatinine, Ser: 0.47 mg/dL (ref 0.44–1.00)
GFR calc Af Amer: >60 mL/min (ref >60)
GFR calc non Af Amer: >60 mL/min (ref >60)
Glucose, Bld: 102 mg/dL — ABNORMAL HIGH (ref 70–99)
Potassium: 2.9 mmol/L — ABNORMAL LOW (ref 3.5–5.1)
Sodium: 140 mmol/L (ref 135–145)

## 2019-07-17 LAB — CBC
HCT: 27.7 % — ABNORMAL LOW (ref 36.0–46.0)
Hemoglobin: 8.6 g/dL — ABNORMAL LOW (ref 12.0–15.0)
MCH: 24.3 pg — ABNORMAL LOW (ref 26.0–34.0)
MCHC: 31 g/dL (ref 30.0–36.0)
MCV: 78.2 fL — ABNORMAL LOW (ref 80.0–100.0)
Platelets: 830 10*3/uL — ABNORMAL HIGH (ref 150–400)
RBC: 3.54 MIL/uL — ABNORMAL LOW (ref 3.87–5.11)
RDW: 26.8 % — ABNORMAL HIGH (ref 11.5–15.5)
WBC: 12 10*3/uL — ABNORMAL HIGH (ref 4.0–10.5)
nRBC: 0.7 % — ABNORMAL HIGH (ref 0.0–0.2)

## 2019-07-17 LAB — MAGNESIUM: Magnesium: 1.3 mg/dL — ABNORMAL LOW (ref 1.7–2.4)

## 2019-07-17 LAB — AMMONIA: Ammonia: 62 umol/L — ABNORMAL HIGH (ref 9–35)

## 2019-07-17 LAB — POTASSIUM: Potassium: 3.2 mmol/L — ABNORMAL LOW (ref 3.5–5.1)

## 2019-07-17 MED ORDER — POTASSIUM CHLORIDE CRYS ER 20 MEQ PO TBCR: 40.0000 meq | EXTENDED_RELEASE_TABLET | ORAL | Status: DC

## 2019-07-17 MED ORDER — MAGNESIUM SULFATE 2 GM/50ML IV SOLN
2.0000 g | Freq: Once | INTRAVENOUS | Status: AC
  Administered 2019-07-17: 2 g via INTRAVENOUS
  Filled 2019-07-17: qty 50

## 2019-07-17 MED ORDER — POTASSIUM CHLORIDE CRYS ER 20 MEQ PO TBCR
40.0000 meq | EXTENDED_RELEASE_TABLET | ORAL | Status: AC
  Administered 2019-07-17 (×2): 40 meq via ORAL
  Filled 2019-07-17 (×2): qty 2

## 2019-07-17 NOTE — Evaluation (Signed)
Occupational Therapy Evaluation Patient Details Name: Angel Hebert MRN: [id number] DOB: 02-19-1956 Today's Date: 07/17/2019    History of Present Illness Angel Hebert is a 63 y.o. female with history of hypertension, GERD, asthma, osteoarthritis. Patient presented secondary to right flank pain and found to have multifocal pneumonia and now strep pneumoniae bacteremia and L3 compression fx.   Clinical Impression   PT admitted with UTI with bacteremia PNA and L3 compression fx. . Pt currently with functional limitiations due to the deficits listed below (see OT problem list). Pt currently min (A) with RW to transfer to sink level and cognitive deficits noted. Pt with pending CT at this time. Pt with increased confusion per boyfriend from baseline.  Pt will benefit from skilled OT to increase their independence and safety with adls and balance to allow discharge SNF. Pt would be home alone and is unsafe to be alone at this time due safety and fall risk.      Follow Up Recommendations  SNF    Equipment Recommendations  3 in 1 bedside commode;Other (comment)(RW)    Recommendations for Other Services       Precautions / Restrictions Precautions Precautions: Fall      Mobility Bed Mobility Overal bed mobility: Modified Independent             General bed mobility comments: increased time and hOB elevated  Transfers Overall transfer level: Needs assistance Equipment used: Rolling walker (2 wheeled) Transfers: Sit to/from Stand Sit to Stand: Min assist         General transfer comment: pt needs (A) to power up from surface and sequence / recall session goals. pt with min cues able to state "shave"     Balance Overall balance assessment: Needs assistance Sitting-balance support: Feet supported Sitting balance-Leahy Scale: Good     Standing balance support: During functional activity Standing balance-Leahy Scale: Poor                              ADL either performed or assessed with clinical judgement   ADL Overall ADL's : Needs assistance/impaired     Grooming: Wash/dry face;Oral care;Minimal assistance;Sitting Grooming Details (indicate cue type and reason): pt shaving face. boyfriend states her appearance is not her normal that she would never have let her face get that way. pt shaving facial hair this session and requires 25% of remaining area to be completed by therapist as patient terminates task with lack of awareness to need to finish Upper Body Bathing: Min guard;Sitting   Lower Body Bathing: Moderate assistance;Sit to/from stand           Toilet Transfer: Minimal Interior and spatial designer Details (indicate cue type and reason): pt states "i have to go to the bathroom " pt with urgency but already on BSC so stood open BSC and sitting for immediate access to viod Toileting- Clothing Manipulation and Hygiene: Min guard;Sitting/lateral lean       Functional mobility during ADLs: Minimal assistance;Rolling walker General ADL Comments: pt agreeable to sink level grooming. boyfriend on arrival had SCD opened and draped on chest like a towel and setting up to shave patient.      Vision         Perception     Praxis      Pertinent Vitals/Pain Pain Assessment: Faces Faces Pain Scale: Hurts little more Pain Location: right flank Pain Descriptors / Indicators: Grimacing Pain Intervention(s): Repositioned;Monitored during session  Hand Dominance Left   Extremity/Trunk Assessment Upper Extremity Assessment Upper Extremity Assessment: Generalized weakness   Lower Extremity Assessment Lower Extremity Assessment: Generalized weakness   Cervical / Trunk Assessment Cervical / Trunk Assessment: Normal   Communication Communication Communication: No difficulties   Cognition Arousal/Alertness: Awake/alert Behavior During Therapy: WFL for tasks assessed/performed Overall Cognitive Status:  Impaired/Different from baseline Area of Impairment: Orientation;Attention;Memory;Following commands;Safety/judgement;Awareness;Problem solving                 Orientation Level: Disoriented to;Time;Situation;Place(reports [facility], Friday, unknown why here ) Current Attention Level: Sustained Memory: Decreased recall of precautions;Decreased short-term memory Following Commands: Follows one step commands with increased time Safety/Judgement: Decreased awareness of safety;Decreased awareness of deficits Awareness: Intellectual Problem Solving: Slow processing;Decreased initiation;Difficulty sequencing General Comments: pt arguing with boyfriend that the buidling is [facility] boyfriend states "no that ambulance was from [facility] they told you they were bringing you to [facility]. Pt looks at therapist and asking "where am i at? [facility] ? " pt reports no awareness of MD that has cared for her for 4 days straight. pt states "i am not crazy" pt explained that she has been very sick and we want to help her have orientation to situation and location. pt states "thank you that makes sense'   General Comments       Exercises     Shoulder Instructions      Home Living Family/patient expects to be discharged to:: Private residence Living Arrangements: Spouse/significant other Available Help at Discharge: Family;Available PRN/intermittently Type of Home: House Home Access: Ramped entrance     Home Layout: One level     Bathroom Shower/Tub: Teacher, early years/pre: Standard     Home Equipment: Building services engineer Comments: lives with boyfriend "Angel Hebert"      Prior Functioning/Environment Level of Independence: Independent                 OT Problem List: Decreased strength;Decreased activity tolerance;Impaired balance (sitting and/or standing);Decreased cognition;Decreased safety awareness;Decreased knowledge of use of DME or AE;Decreased  knowledge of precautions;Cardiopulmonary status limiting activity;Pain      OT Treatment/Interventions: Self-care/ADL training;Therapeutic exercise;Therapeutic activities;Energy conservation;DME and/or AE instruction;Cognitive remediation/compensation;Patient/family education;Balance training    OT Goals(Current goals can be found in the care plan section) Acute Rehab OT Goals Patient Stated Goal: to shave face OT Goal Formulation: With patient/family Time For Goal Achievement: 07/31/19 Potential to Achieve Goals: Good  OT Frequency: Min 2X/week   Barriers to D/C: Decreased caregiver support  boyfriend must travel for work. Boyfriend states I was here on Tuesday but i had to leave town to work. (saturday is current day of week )       Co-evaluation              AM-PAC OT "6 Clicks" Daily Activity     Outcome Measure Help from another person eating meals?: A Little Help from another person taking care of personal grooming?: A Little Help from another person toileting, which includes using toliet, bedpan, or urinal?: A Little Help from another person bathing (including washing, rinsing, drying)?: A Little Help from another person to put on and taking off regular upper body clothing?: A Little Help from another person to put on and taking off regular lower body clothing?: A Lot 6 Click Score: 17   End of Session Equipment Utilized During Treatment: Gait belt;Rolling walker Nurse Communication: Mobility status;Precautions  Activity Tolerance: Patient tolerated treatment well  Patient left: in bed;with call bell/phone within reach;with bed alarm set;with family/visitor present  OT Visit Diagnosis: Unsteadiness on feet (R26.81);Muscle weakness (generalized) (M62.81)                Time: [id number] OT Time Calculation (min): 27 min Charges:  OT General Charges $OT Visit: 1 Visit OT Evaluation $OT Eval Moderate Complexity: 1 Mod OT Treatments $Self Care/Home Management : 8-22  mins   Brynn, OTR/L  Acute Rehabilitation Services Pager: [phone] Office: [phone] .   Jeri Modena 07/17/2019, 2:01 PM

## 2019-07-17 NOTE — Progress Notes (Signed)
PROGRESS NOTE    Angel Hebert  [id number] DOB: 1955-10-08 DOA: 07/12/2019 PCP: Charlott Rakes, MD   Brief Narrative: Angel Hebert is a 63 y.o. female with history of hypertension, GERD, asthma, osteoarthritis. Patient presented secondary to right flank pain and found to have multifocal pneumonia and now strep pneumoniae bacteremia.   Assessment & Plan:   Principal Problem:   Symptomatic anemia Active Problems:   Hyperlipidemia   Essential hypertension   GERD   Sepsis ([place])   Hypokalemia   Lobar pneumonia ([place])   Strep bacteremia Patient transitioned to Ceftriaxone. Transthoracic Echocardiogram not suggestive of vegetations. -Await repeat blood cultures (11/12) -Continue Ceftriaxone -ID recommendations: Penicillin G IV  Symptomatic anemia Baseline hemoglobin difficult to quantify but possible baseline is between 9-10. Hemoglobin of 5.2 on admission and is improved s/p 2 units of PRBC  Multifocal pneumonia Strep pneumo positive. Emprically treated with Vancomycin and Cefepime, transitioned to Ceftriaxone and now to Penicillin G. Patient continues to have tachypnea and oxygen requirement -Continue Penicillin per ID recommendations  Acute systolic heart failure EF of 45-50% on recent Transthoracic Echocardiogram. -Continue Lasix 40 mg IV BID  Acute respiratory failure with hypoxia Secondary to pneumonia. Improving slowly -Wean to room air  Confusion Unsure of etiology. Possibly related to alcohol withdrawal although symptoms are not very significant. Discussed with sister who states the patient has had some cognitive concerns in the past but it is much worse now. -CT head  Abdominal distension Improved with bowel movement.  Sepsis Present on admission. Secondary to above. Resolved.  Leukocytosis Secondary to bacteremia/pneumonia. Improving. -CBC pending today  Thrombocytosis Likely secondary to bacteremia. Anticipate improvement with  treatment.  Hypoalbuminemia Likely secondary to poor nutrition. Dietician consult.  Hypokalemia Replete as needed. Associated hypomagnesemia -Replete  Hypertension Slight uncontrolled. On Hyzaar as an outpatient.  Hyperlipidemia  On Lipitor as an outpatient  GERD On Protonix as an outpatient  L3 compression fracture Subacute. -Pain management    DVT prophylaxis: SCDs Code Status:   Code Status: DNR Family Communication: Boyfriend at bedside Disposition Plan: Discharge pending antibiotic regimen recommendations and resolution of hypoxia   Consultants:   None  Procedures:  11/10: Transthoracic Echocardiogram   IMPRESSIONS    1. Left ventricular ejection fraction, by visual estimation, is 45 to 50%. The left ventricle has normal function. There is no left ventricular hypertrophy.  2. Left ventricular diastolic parameters are consistent with Grade I diastolic dysfunction (impaired relaxation).  3. The left ventricle demonstrates global hypokinesis.  4. Global right ventricle has normal systolic function.The right ventricular size is normal. No increase in right ventricular wall thickness.  5. Left atrial size was normal.  6. Right atrial size was normal.  7. Trivial pericardial effusion is present.  8. The mitral valve is normal in structure. Mild mitral valve regurgitation. No evidence of mitral stenosis.  9. The tricuspid valve is normal in structure. Tricuspid valve regurgitation is trivial. 10. The aortic valve is tricuspid. Aortic valve regurgitation is not visualized. Mild aortic valve sclerosis without stenosis. 11. TR signal is inadequate for assessing pulmonary artery systolic pressure. 12. The inferior vena cava is dilated in size with >50% respiratory variability, suggesting right atrial pressure of 8 mmHg.  FINDINGS  Left Ventricle: Left ventricular ejection fraction, by visual estimation, is 45 to 50%. The left ventricle has normal function. The left  ventricle demonstrates global hypokinesis. There is no left ventricular hypertrophy. Left ventricular diastolic  parameters are consistent with Grade I diastolic dysfunction (impaired relaxation).  Right  Ventricle: The right ventricular size is normal. No increase in right ventricular wall thickness. Global RV systolic function is has normal systolic function.  Left Atrium: Left atrial size was normal in size.  Right Atrium: Right atrial size was normal in size  Pericardium: Trivial pericardial effusion is present.  Mitral Valve: The mitral valve is normal in structure. No evidence of mitral valve stenosis by observation. Mild mitral valve regurgitation.  Tricuspid Valve: The tricuspid valve is normal in structure. Tricuspid valve regurgitation is trivial.  Aortic Valve: The aortic valve is tricuspid. Aortic valve regurgitation is not visualized. Mild aortic valve sclerosis is present, with no evidence of aortic valve stenosis.  Pulmonic Valve: The pulmonic valve was normal in structure. Pulmonic valve regurgitation is trivial.  Aorta: The aortic root is normal in size and structure.  Venous: The inferior vena cava is dilated in size with greater than 50% respiratory variability, suggesting right atrial pressure of 8 mmHg.  IAS/Shunts: No atrial level shunt detected by color flow Doppler.  Antimicrobials:  Vancomycin  Cefepime  Ceftriaxone    Subjective: No concerns today. Does not remember me.  Objective: Vitals:   07/16/19 2009 07/16/19 2127 07/17/19 0519 07/17/19 0811  BP:  136/82 119/69   Pulse: 96 95 100   Resp: 18 (!) 22 (!) 22   Temp:  98 F (36.7 C) 97.8 F (36.6 C)   TempSrc:  Oral Oral   SpO2: 94% 98% 98% 97%  Weight:      Height:        Intake/Output Summary (Last 24 hours) at 07/17/2019 1408 Last data filed at 07/16/2019 2100 Gross per 24 hour  Intake 500.4 ml  Output 3750 ml  Net -3249.6 ml   Filed Weights   07/12/19 2120 07/14/19  0326 07/15/19 0432  Weight: 66.6 kg 67.7 kg 69.9 kg    Examination:  General exam: Appears calm and comfortable Respiratory system: Diminished, wheezing. Respiratory effort normal. Cardiovascular system: S1 & S2 heard, RRR. No murmurs, rubs, gallops or clicks. Gastrointestinal system: Abdomen is nondistended, soft and nontender. No organomegaly or masses felt. Normal bowel sounds heard. Central nervous system: Alert and oriented to person. No focal neurological deficits. Extremities: Trace edema. No calf tenderness Skin: No cyanosis. No rashes Psychiatry: Judgement and insight appear normal. Mood & affect appropriate.      Data Reviewed: I have personally reviewed following labs and imaging studies  CBC: Recent Labs  Lab 07/12/19 1326 07/13/19 0806 07/14/19 0240 07/15/19 1329 07/16/19 1030 07/17/19 0747  WBC 38.1* 29.6* 22.0* 17.4* 15.4* 12.0*  NEUTROABS 36.6* 25.8* 18.3*  --   --   --   HGB 5.2* 8.3* 8.8* 9.0* 8.5* 8.6*  HCT 17.3* 25.9* 28.4* 28.3* 27.2* 27.7*  MCV 73.3* 75.5* 77.8* 76.3* 78.8* 78.2*  PLT 590* 620* 593* 800* 819* 0000000*   Basic Metabolic Panel: Recent Labs  Lab 07/12/19 1323  07/13/19 0806 07/14/19 0240 07/15/19 1329 07/16/19 1030 07/17/19 0747  NA  --    < > 146* 145 141 138 140  K  --    < > 2.9* 3.2* 2.8* 2.7* 2.9*  CL  --    < > 114* 114* 108 102 99  CO2  --    < > 20* 19* 22 24 29   GLUCOSE  --    < > 120* 86 92 137* 102*  BUN  --    < > 14 12 6* <5* <5*  CREATININE  --    < >  0.49 0.62 0.53 0.54 0.47  CALCIUM  --    < > 8.4* 8.4* 8.3* 7.8* 7.5*  MG 2.0  --  1.9 1.7  --   --  1.3*   < > = values in this interval not displayed.   GFR: Estimated Creatinine Clearance: 70.9 mL/min (by C-G formula based on SCr of 0.47 mg/dL). Liver Function Tests: Recent Labs  Lab 07/12/19 1326 07/13/19 0806 07/14/19 0240  AST 70* 47* 33  ALT 21 18 18   ALKPHOS 83 79 79  BILITOT 1.9* 3.1* 1.4*  PROT 7.6 6.9 7.1  ALBUMIN 2.3* 1.9* 2.0*   Recent Labs   Lab 07/12/19 1326  LIPASE 286*   No results for input(s): AMMONIA in the last 168 hours. Coagulation Profile: No results for input(s): INR, PROTIME in the last 168 hours. Cardiac Enzymes: No results for input(s): CKTOTAL, CKMB, CKMBINDEX, TROPONINI in the last 168 hours. BNP (last 3 results) No results for input(s): PROBNP in the last 8760 hours. HbA1C: No results for input(s): HGBA1C in the last 72 hours. CBG: No results for input(s): GLUCAP in the last 168 hours. Lipid Profile: No results for input(s): CHOL, HDL, LDLCALC, TRIG, CHOLHDL, LDLDIRECT in the last 72 hours. Thyroid Function Tests: No results for input(s): TSH, T4TOTAL, FREET4, T3FREE, THYROIDAB in the last 72 hours. Anemia Panel: No results for input(s): VITAMINB12, FOLATE, FERRITIN, TIBC, IRON, RETICCTPCT in the last 72 hours. Sepsis Labs: Recent Labs  Lab 07/12/19 1524 07/12/19 2114 07/13/19 0806 07/14/19 0240 07/15/19 0324  PROCALCITON  --   --  4.82 3.53 1.68  LATICACIDVEN 1.8 1.5  --   --   --     Recent Results (from the past 240 hour(s))  SARS CORONAVIRUS 2 (TAT 6-24 HRS) Nasopharyngeal Nasopharyngeal Swab     Status: None   Collection Time: 07/12/19  1:25 PM   Specimen: Nasopharyngeal Swab  Result Value Ref Range Status   SARS Coronavirus 2 NEGATIVE NEGATIVE Final    Comment: (NOTE) SARS-CoV-2 target nucleic acids are NOT DETECTED. The SARS-CoV-2 RNA is generally detectable in upper and lower respiratory specimens during the acute phase of infection. Negative results do not preclude SARS-CoV-2 infection, do not rule out co-infections with other pathogens, and should not be used as the sole basis for treatment or other patient management decisions. Negative results must be combined with clinical observations, patient history, and epidemiological information. The expected result is Negative. Fact Sheet for Patients: SugarRoll.be Fact Sheet for Healthcare Providers:  https://www.woods-mathews.com/ This test is not yet approved or cleared by the [place] FDA and  has been authorized for detection and/or diagnosis of SARS-CoV-2 by FDA under an Emergency Use Authorization (EUA). This EUA will remain  in effect (meaning this test can be used) for the duration of the COVID-19 declaration under Section 56 4(b)(1) of the Act, 21 U.S.C. section 360bbb-3(b)(1), unless the authorization is terminated or revoked sooner. Performed at [facility], [place] [address] [place], [place] [address]   Blood culture (routine x 2)     Status: Abnormal   Collection Time: 07/12/19  1:30 PM   Specimen: BLOOD  Result Value Ref Range Status   Specimen Description BLOOD LEFT ANTECUBITAL  Final   Special Requests   Final    BOTTLES DRAWN AEROBIC AND ANAEROBIC Blood Culture adequate volume   Culture  Setup Time   Final    GRAM POSITIVE COCCI IN CHAINS AEROBIC BOTTLE ONLY CRITICAL RESULT CALLED TO, READ BACK BY AND VERIFIED  WITH: PHARMD J FRAENS [id number] AT 843 AM    Culture (A)  Final    STREPTOCOCCUS PNEUMONIAE SUSCEPTIBILITIES PERFORMED ON PREVIOUS CULTURE WITHIN THE LAST 5 DAYS. Performed at [facility], [place] [address], [place], [place] [address]    Report Status 07/15/2019 FINAL  Final  Blood culture (routine x 2)     Status: Abnormal   Collection Time: 07/12/19  1:30 PM   Specimen: BLOOD  Result Value Ref Range Status   Specimen Description BLOOD RIGHT ANTECUBITAL  Final   Special Requests   Final    BOTTLES DRAWN AEROBIC AND ANAEROBIC Blood Culture adequate volume   Culture  Setup Time   Final    GRAM POSITIVE COCCI IN CHAINS IN BOTH AEROBIC AND ANAEROBIC BOTTLES CRITICAL RESULT CALLED TO, READ BACK BY AND VERIFIED WITHDaun Peacock, PHARMD, AT 1048 07/13/19 BY D. VANHOOK Performed at [facility], [place] [address], [place], [place] [address]    Culture STREPTOCOCCUS PNEUMONIAE (A)  Final   Report Status 07/15/2019 FINAL  Final    Organism ID, Bacteria STREPTOCOCCUS PNEUMONIAE  Final      Susceptibility   Streptococcus pneumoniae - MIC*    ERYTHROMYCIN <=0.12 SENSITIVE Sensitive     LEVOFLOXACIN 1 SENSITIVE Sensitive     VANCOMYCIN 0.25 SENSITIVE Sensitive     PENICILLIN (non-meningitis) <=0.06 SENSITIVE Sensitive     CEFTRIAXONE (non-meningitis) <=0.12 SENSITIVE Sensitive     * STREPTOCOCCUS PNEUMONIAE  Blood Culture ID Panel (Reflexed)     Status: Abnormal   Collection Time: 07/12/19  1:30 PM  Result Value Ref Range Status   Enterococcus species NOT DETECTED NOT DETECTED Final   Listeria monocytogenes NOT DETECTED NOT DETECTED Final   Staphylococcus species NOT DETECTED NOT DETECTED Final   Staphylococcus aureus (BCID) NOT DETECTED NOT DETECTED Final   Streptococcus species DETECTED (A) NOT DETECTED Final    Comment: CRITICAL RESULT CALLED TO, READ BACK BY AND VERIFIED WITH: PHARMD J FRAENS [id number] AT 847 AM BY CM    Streptococcus agalactiae NOT DETECTED NOT DETECTED Final   Streptococcus pneumoniae DETECTED (A) NOT DETECTED Final    Comment: CRITICAL RESULT CALLED TO, READ BACK BY AND VERIFIED WITH: PHARMD J FRAENS [id number] AT 847 AM BY CM    Streptococcus pyogenes NOT DETECTED NOT DETECTED Final   Acinetobacter baumannii NOT DETECTED NOT DETECTED Final   Enterobacteriaceae species NOT DETECTED NOT DETECTED Final   Enterobacter cloacae complex NOT DETECTED NOT DETECTED Final   Escherichia coli NOT DETECTED NOT DETECTED Final   Klebsiella oxytoca NOT DETECTED NOT DETECTED Final   Klebsiella pneumoniae NOT DETECTED NOT DETECTED Final   Proteus species NOT DETECTED NOT DETECTED Final   Serratia marcescens NOT DETECTED NOT DETECTED Final   Haemophilus influenzae NOT DETECTED NOT DETECTED Final   Neisseria meningitidis NOT DETECTED NOT DETECTED Final   Pseudomonas aeruginosa NOT DETECTED NOT DETECTED Final   Candida albicans NOT DETECTED NOT DETECTED Final   Candida glabrata NOT DETECTED NOT DETECTED Final    Candida krusei NOT DETECTED NOT DETECTED Final   Candida parapsilosis NOT DETECTED NOT DETECTED Final   Candida tropicalis NOT DETECTED NOT DETECTED Final    Comment: Performed at [facility], [address], [place], [place] [address]  MRSA PCR Screening     Status: None   Collection Time: 07/12/19 11:48 PM   Specimen: Nasal Mucosa; Nasopharyngeal  Result Value Ref Range Status   MRSA by PCR NEGATIVE NEGATIVE Final  Comment:        The GeneXpert MRSA Assay (FDA approved for NASAL specimens only), is one component of a comprehensive MRSA colonization surveillance program. It is not intended to diagnose MRSA infection nor to guide or monitor treatment for MRSA infections. Performed at [facility], [place] [address] [place], [place] [address]   Culture, blood (routine x 2)     Status: None (Preliminary result)   Collection Time: 07/15/19  1:29 PM   Specimen: BLOOD RIGHT HAND  Result Value Ref Range Status   Specimen Description BLOOD RIGHT HAND  Final   Special Requests   Final    BOTTLES DRAWN AEROBIC ONLY Blood Culture adequate volume   Culture   Final    NO GROWTH < 24 HOURS Performed at [facility], [place] [address], [place], [place] [address]    Report Status PENDING  Incomplete  Culture, blood (routine x 2)     Status: None (Preliminary result)   Collection Time: 07/15/19  1:29 PM   Specimen: BLOOD RIGHT HAND  Result Value Ref Range Status   Specimen Description BLOOD RIGHT HAND  Final   Special Requests   Final    BOTTLES DRAWN AEROBIC ONLY Blood Culture adequate volume   Culture   Final    NO GROWTH < 24 HOURS Performed at [facility], [place] [address], [place], [place] [address]    Report Status PENDING  Incomplete         Radiology Studies: Dg Chest 2 View  Result Date: 07/16/2019 CLINICAL DATA:  Chest pain in a patient with pneumonia. EXAM: CHEST - 2 VIEW COMPARISON:  CT chest 07/13/2019 single-view of the chest 07/12/2019.  FINDINGS: Right greater than left pleural effusions and airspace disease have worsened. There is cardiomegaly. No pneumothorax. No acute or focal bony abnormality. IMPRESSION: Worsened right greater than left pleural effusions and airspace disease. Cardiomegaly. Electronically Signed   By: Inge Rise M.D.   On: 07/16/2019 10:05        Scheduled Meds: . furosemide  40 mg Intravenous BID  . mouth rinse  15 mL Mouth Rinse BID  . mometasone-formoterol  2 puff Inhalation BID  . montelukast  10 mg Oral QHS  . potassium chloride  40 mEq Oral Q4H  . thiamine  100 mg Oral Daily   Continuous Infusions: . sodium chloride    . penicillin g continuous IV infusion 12 Million Units (07/17/19 1249)     LOS: 5 days     Cordelia Poche, MD [facility] 07/17/2019, 2:08 PM  If 7PM-7AM, please contact night-coverage [email or web address]

## 2019-07-18 LAB — BASIC METABOLIC PANEL
Anion gap: 11 (ref 5–15)
BUN: <5 mg/dL — ABNORMAL LOW (ref 8–23)
CO2: 28 mmol/L (ref 22–32)
Calcium: 7.6 mg/dL — ABNORMAL LOW (ref 8.9–10.3)
Chloride: 97 mmol/L — ABNORMAL LOW (ref 98–111)
Creatinine, Ser: 0.5 mg/dL (ref 0.44–1.00)
GFR calc Af Amer: >60 mL/min (ref >60)
GFR calc non Af Amer: >60 mL/min (ref >60)
Glucose, Bld: 103 mg/dL — ABNORMAL HIGH (ref 70–99)
Potassium: 3.3 mmol/L — ABNORMAL LOW (ref 3.5–5.1)
Sodium: 136 mmol/L (ref 135–145)

## 2019-07-18 LAB — CBC
HCT: 26.4 % — ABNORMAL LOW (ref 36.0–46.0)
Hemoglobin: 8.2 g/dL — ABNORMAL LOW (ref 12.0–15.0)
MCH: 24.5 pg — ABNORMAL LOW (ref 26.0–34.0)
MCHC: 31.1 g/dL (ref 30.0–36.0)
MCV: 78.8 fL — ABNORMAL LOW (ref 80.0–100.0)
Platelets: 800 10*3/uL — ABNORMAL HIGH (ref 150–400)
RBC: 3.35 MIL/uL — ABNORMAL LOW (ref 3.87–5.11)
RDW: 27 % — ABNORMAL HIGH (ref 11.5–15.5)
WBC: 12.6 10*3/uL — ABNORMAL HIGH (ref 4.0–10.5)
nRBC: 0.3 % — ABNORMAL HIGH (ref 0.0–0.2)

## 2019-07-18 MED ORDER — POTASSIUM CHLORIDE CRYS ER 20 MEQ PO TBCR
20.0000 meq | EXTENDED_RELEASE_TABLET | Freq: Two times a day (BID) | ORAL | Status: DC
  Administered 2019-07-18 – 2019-07-21 (×7): 20 meq via ORAL
  Filled 2019-07-18 (×7): qty 1

## 2019-07-18 MED ORDER — FUROSEMIDE 10 MG/ML IJ SOLN
40.0000 mg | Freq: Two times a day (BID) | INTRAMUSCULAR | Status: DC
  Administered 2019-07-18 – 2019-07-20 (×5): 40 mg via INTRAVENOUS
  Filled 2019-07-18 (×7): qty 4

## 2019-07-18 MED ORDER — TRAZODONE HCL 50 MG PO TABS
50.0000 mg | ORAL_TABLET | Freq: Once | ORAL | Status: AC
  Administered 2019-07-18: 50 mg via ORAL
  Filled 2019-07-18: qty 1

## 2019-07-18 NOTE — Progress Notes (Signed)
Notified Bodenhemir, NP that pt is requesting sleeping pill. Bodenhemir, NP ordered trazodone. Will continue to monitor pt throughout the shift.  

## 2019-07-18 NOTE — Progress Notes (Signed)
PROGRESS NOTE    Angel Hebert  [id number] DOB: 12-May-1956 DOA: 07/12/2019 PCP: Charlott Rakes, MD   Brief Narrative: Angel Hebert is a 63 y.o. female with history of hypertension, GERD, asthma, osteoarthritis. Patient presented secondary to right flank pain and found to have multifocal pneumonia and now strep pneumoniae bacteremia.   Assessment & Plan:   Principal Problem:   Symptomatic anemia Active Problems:   Hyperlipidemia   Essential hypertension   GERD   Sepsis ([place])   Hypokalemia   Lobar pneumonia ([place])   Strep bacteremia Patient transitioned to Ceftriaxone. Transthoracic Echocardiogram not suggestive of vegetations. Repeat blood cultures (11/12) with no growth to date -ID recommendations: Penicillin G IV  Symptomatic anemia Baseline hemoglobin difficult to quantify but possible baseline is between 9-10. Hemoglobin of 5.2 on admission and is improved s/p 2 units of PRBC  Multifocal pneumonia Strep pneumo positive. Emprically treated with Vancomycin and Cefepime, transitioned to Ceftriaxone and now to Penicillin G. Patient continues to have tachypnea and oxygen requirement -Continue Penicillin per ID recommendations  Acute systolic heart failure EF of 45-50% on recent Transthoracic Echocardiogram. -Continue Lasix 40 mg IV BID  Acute respiratory failure with hypoxia Secondary to pneumonia. Improving slowly -Wean to room air  Confusion Unsure of etiology. Possibly related to alcohol withdrawal although symptoms are not very significant. Discussed with sister who states the patient has had some cognitive concerns in the past but it is much worse now. This appears to wax and wane. CT head unremarkable for acute process but significant for small vessel white matter disease and extensive periventricular/deep white matter hypodensity  Abdominal distension Improved with bowel movement.  Sepsis Present on admission. Secondary to above. Resolved.   Leukocytosis Secondary to bacteremia/pneumonia. Improving. -CBC pending today  Thrombocytosis Likely secondary to bacteremia. Anticipate improvement with treatment.  Hypoalbuminemia Likely secondary to poor nutrition. Dietician consult.  Hypokalemia Replete as needed. Associated hypomagnesemia -Replete  Hypertension Slight uncontrolled. On Hyzaar as an outpatient.  Hyperlipidemia  On Lipitor as an outpatient  GERD On Protonix as an outpatient  L3 compression fracture Subacute. -Pain management    DVT prophylaxis: SCDs Code Status:   Code Status: DNR Family Communication: Boyfriend at bedside Disposition Plan: Discharge pending antibiotic regimen recommendations and resolution of hypoxia   Consultants:   None  Procedures:  11/10: Transthoracic Echocardiogram   IMPRESSIONS    1. Left ventricular ejection fraction, by visual estimation, is 45 to 50%. The left ventricle has normal function. There is no left ventricular hypertrophy.  2. Left ventricular diastolic parameters are consistent with Grade I diastolic dysfunction (impaired relaxation).  3. The left ventricle demonstrates global hypokinesis.  4. Global right ventricle has normal systolic function.The right ventricular size is normal. No increase in right ventricular wall thickness.  5. Left atrial size was normal.  6. Right atrial size was normal.  7. Trivial pericardial effusion is present.  8. The mitral valve is normal in structure. Mild mitral valve regurgitation. No evidence of mitral stenosis.  9. The tricuspid valve is normal in structure. Tricuspid valve regurgitation is trivial. 10. The aortic valve is tricuspid. Aortic valve regurgitation is not visualized. Mild aortic valve sclerosis without stenosis. 11. TR signal is inadequate for assessing pulmonary artery systolic pressure. 12. The inferior vena cava is dilated in size with >50% respiratory variability, suggesting right atrial pressure of  8 mmHg.  FINDINGS  Left Ventricle: Left ventricular ejection fraction, by visual estimation, is 45 to 50%. The left ventricle has normal function. The left  ventricle demonstrates global hypokinesis. There is no left ventricular hypertrophy. Left ventricular diastolic  parameters are consistent with Grade I diastolic dysfunction (impaired relaxation).  Right Ventricle: The right ventricular size is normal. No increase in right ventricular wall thickness. Global RV systolic function is has normal systolic function.  Left Atrium: Left atrial size was normal in size.  Right Atrium: Right atrial size was normal in size  Pericardium: Trivial pericardial effusion is present.  Mitral Valve: The mitral valve is normal in structure. No evidence of mitral valve stenosis by observation. Mild mitral valve regurgitation.  Tricuspid Valve: The tricuspid valve is normal in structure. Tricuspid valve regurgitation is trivial.  Aortic Valve: The aortic valve is tricuspid. Aortic valve regurgitation is not visualized. Mild aortic valve sclerosis is present, with no evidence of aortic valve stenosis.  Pulmonic Valve: The pulmonic valve was normal in structure. Pulmonic valve regurgitation is trivial.  Aorta: The aortic root is normal in size and structure.  Venous: The inferior vena cava is dilated in size with greater than 50% respiratory variability, suggesting right atrial pressure of 8 mmHg.  IAS/Shunts: No atrial level shunt detected by color flow Doppler.  Antimicrobials:  Vancomycin  Cefepime  Ceftriaxone    Subjective: Cough with some chest pain with coughing. Cough is non-productive.  Objective: Vitals:   07/17/19 2147 07/18/19 0528 07/18/19 0705 07/18/19 0800  BP: 126/78 (!) 147/76    Pulse: (!) 103 94    Resp: 16 19    Temp: 98.1 F (36.7 C) 99.7 F (37.6 C)    TempSrc: Oral Oral    SpO2: 100% 97%  98%  Weight:   68.8 kg   Height:        Intake/Output Summary  (Last 24 hours) at 07/18/2019 1001 Last data filed at 07/17/2019 1441 Gross per 24 hour  Intake 790 ml  Output -  Net 790 ml   Filed Weights   07/14/19 0326 07/15/19 0432 07/18/19 0705  Weight: 67.7 kg 69.9 kg 68.8 kg    Examination:  General exam: Appears calm and comfortable Respiratory system: Rales bilaterally. Respiratory effort normal. Cardiovascular system: S1 & S2 heard, RRR. No murmurs, rubs, gallops or clicks. Gastrointestinal system: Abdomen is nondistended, soft and nontender. No organomegaly or masses felt. Normal bowel sounds heard. Central nervous system: Alert and oriented. No focal neurological deficits. Extremities: Trace edema. No calf tenderness Skin: No cyanosis. No rashes Psychiatry: Judgement and insight appear normal. Mood & affect appropriate.     Data Reviewed: I have personally reviewed following labs and imaging studies  CBC: Recent Labs  Lab 07/12/19 1326 07/13/19 0806 07/14/19 0240 07/15/19 1329 07/16/19 1030 07/17/19 0747 07/18/19 0306  WBC 38.1* 29.6* 22.0* 17.4* 15.4* 12.0* 12.6*  NEUTROABS 36.6* 25.8* 18.3*  --   --   --   --   HGB 5.2* 8.3* 8.8* 9.0* 8.5* 8.6* 8.2*  HCT 17.3* 25.9* 28.4* 28.3* 27.2* 27.7* 26.4*  MCV 73.3* 75.5* 77.8* 76.3* 78.8* 78.2* 78.8*  PLT 590* 620* 593* 800* 819* 830* [id number]*   Basic Metabolic Panel: Recent Labs  Lab 07/12/19 1323  07/13/19 0806 07/14/19 0240 07/15/19 1329 07/16/19 1030 07/17/19 0747 07/17/19 1153 07/18/19 0306  NA  --    < > 146* 145 141 138 140  --  136  K  --    < > 2.9* 3.2* 2.8* 2.7* 2.9* 3.2* 3.3*  CL  --    < > 114* 114* 108 102 99  --  97*  CO2  --    < > 20* 19* 22 24 29   --  28  GLUCOSE  --    < > 120* 86 92 137* 102*  --  103*  BUN  --    < > 14 12 6* <5* <5*  --  <5*  CREATININE  --    < > 0.49 0.62 0.53 0.54 0.47  --  0.50  CALCIUM  --    < > 8.4* 8.4* 8.3* 7.8* 7.5*  --  7.6*  MG 2.0  --  1.9 1.7  --   --  1.3*  --   --    < > = values in this interval not displayed.    GFR: Estimated Creatinine Clearance: 70.9 mL/min (by C-G formula based on SCr of 0.5 mg/dL). Liver Function Tests: Recent Labs  Lab 07/12/19 1326 07/13/19 0806 07/14/19 0240  AST 70* 47* 33  ALT 21 18 18   ALKPHOS 83 79 79  BILITOT 1.9* 3.1* 1.4*  PROT 7.6 6.9 7.1  ALBUMIN 2.3* 1.9* 2.0*   Recent Labs  Lab 07/12/19 1326  LIPASE 286*   Recent Labs  Lab 07/17/19 1153  AMMONIA 62*   Coagulation Profile: No results for input(s): INR, PROTIME in the last 168 hours. Cardiac Enzymes: No results for input(s): CKTOTAL, CKMB, CKMBINDEX, TROPONINI in the last 168 hours. BNP (last 3 results) No results for input(s): PROBNP in the last 8760 hours. HbA1C: No results for input(s): HGBA1C in the last 72 hours. CBG: No results for input(s): GLUCAP in the last 168 hours. Lipid Profile: No results for input(s): CHOL, HDL, LDLCALC, TRIG, CHOLHDL, LDLDIRECT in the last 72 hours. Thyroid Function Tests: No results for input(s): TSH, T4TOTAL, FREET4, T3FREE, THYROIDAB in the last 72 hours. Anemia Panel: No results for input(s): VITAMINB12, FOLATE, FERRITIN, TIBC, IRON, RETICCTPCT in the last 72 hours. Sepsis Labs: Recent Labs  Lab 07/12/19 1524 07/12/19 2114 07/13/19 0806 07/14/19 0240 07/15/19 0324  PROCALCITON  --   --  4.82 3.53 1.68  LATICACIDVEN 1.8 1.5  --   --   --     Recent Results (from the past 240 hour(s))  SARS CORONAVIRUS 2 (TAT 6-24 HRS) Nasopharyngeal Nasopharyngeal Swab     Status: None   Collection Time: 07/12/19  1:25 PM   Specimen: Nasopharyngeal Swab  Result Value Ref Range Status   SARS Coronavirus 2 NEGATIVE NEGATIVE Final    Comment: (NOTE) SARS-CoV-2 target nucleic acids are NOT DETECTED. The SARS-CoV-2 RNA is generally detectable in upper and lower respiratory specimens during the acute phase of infection. Negative results do not preclude SARS-CoV-2 infection, do not rule out co-infections with other pathogens, and should not be used as the sole  basis for treatment or other patient management decisions. Negative results must be combined with clinical observations, patient history, and epidemiological information. The expected result is Negative. Fact Sheet for Patients: SugarRoll.be Fact Sheet for Healthcare Providers: https://www.woods-mathews.com/ This test is not yet approved or cleared by the [place] FDA and  has been authorized for detection and/or diagnosis of SARS-CoV-2 by FDA under an Emergency Use Authorization (EUA). This EUA will remain  in effect (meaning this test can be used) for the duration of the COVID-19 declaration under Section 56 4(b)(1) of the Act, 21 U.S.C. section 360bbb-3(b)(1), unless the authorization is terminated or revoked sooner. Performed at [facility], [place] [address] [place], [place] [address]   Blood culture (routine x 2)  Status: Abnormal   Collection Time: 07/12/19  1:30 PM   Specimen: BLOOD  Result Value Ref Range Status   Specimen Description BLOOD LEFT ANTECUBITAL  Final   Special Requests   Final    BOTTLES DRAWN AEROBIC AND ANAEROBIC Blood Culture adequate volume   Culture  Setup Time   Final    GRAM POSITIVE COCCI IN CHAINS AEROBIC BOTTLE ONLY CRITICAL RESULT CALLED TO, READ BACK BY AND VERIFIED WITH: PHARMD J FRAENS [id number] AT 26 AM    Culture (A)  Final    STREPTOCOCCUS PNEUMONIAE SUSCEPTIBILITIES PERFORMED ON PREVIOUS CULTURE WITHIN THE LAST 5 DAYS. Performed at [facility], [place] [address] [place], [place] [address]    Report Status 07/15/2019 FINAL  Final  Blood culture (routine x 2)     Status: Abnormal   Collection Time: 07/12/19  1:30 PM   Specimen: BLOOD  Result Value Ref Range Status   Specimen Description BLOOD RIGHT ANTECUBITAL  Final   Special Requests   Final    BOTTLES DRAWN AEROBIC AND ANAEROBIC Blood Culture adequate volume   Culture  Setup Time   Final    GRAM POSITIVE COCCI IN CHAINS IN  BOTH AEROBIC AND ANAEROBIC BOTTLES CRITICAL RESULT CALLED TO, READ BACK BY AND VERIFIED WITHDaun Peacock, PHARMD, AT 1048 07/13/19 BY D. VANHOOK Performed at [facility], [place] [address], [place], [place] [address]    Culture STREPTOCOCCUS PNEUMONIAE (A)  Final   Report Status 07/15/2019 FINAL  Final   Organism ID, Bacteria STREPTOCOCCUS PNEUMONIAE  Final      Susceptibility   Streptococcus pneumoniae - MIC*    ERYTHROMYCIN <=0.12 SENSITIVE Sensitive     LEVOFLOXACIN 1 SENSITIVE Sensitive     VANCOMYCIN 0.25 SENSITIVE Sensitive     PENICILLIN (non-meningitis) <=0.06 SENSITIVE Sensitive     CEFTRIAXONE (non-meningitis) <=0.12 SENSITIVE Sensitive     * STREPTOCOCCUS PNEUMONIAE  Blood Culture ID Panel (Reflexed)     Status: Abnormal   Collection Time: 07/12/19  1:30 PM  Result Value Ref Range Status   Enterococcus species NOT DETECTED NOT DETECTED Final   Listeria monocytogenes NOT DETECTED NOT DETECTED Final   Staphylococcus species NOT DETECTED NOT DETECTED Final   Staphylococcus aureus (BCID) NOT DETECTED NOT DETECTED Final   Streptococcus species DETECTED (A) NOT DETECTED Final    Comment: CRITICAL RESULT CALLED TO, READ BACK BY AND VERIFIED WITH: PHARMD J FRAENS [id number] AT 847 AM BY CM    Streptococcus agalactiae NOT DETECTED NOT DETECTED Final   Streptococcus pneumoniae DETECTED (A) NOT DETECTED Final    Comment: CRITICAL RESULT CALLED TO, READ BACK BY AND VERIFIED WITH: PHARMD J FRAENS [id number] AT 847 AM BY CM    Streptococcus pyogenes NOT DETECTED NOT DETECTED Final   Acinetobacter baumannii NOT DETECTED NOT DETECTED Final   Enterobacteriaceae species NOT DETECTED NOT DETECTED Final   Enterobacter cloacae complex NOT DETECTED NOT DETECTED Final   Escherichia coli NOT DETECTED NOT DETECTED Final   Klebsiella oxytoca NOT DETECTED NOT DETECTED Final   Klebsiella pneumoniae NOT DETECTED NOT DETECTED Final   Proteus species NOT DETECTED NOT DETECTED Final   Serratia  marcescens NOT DETECTED NOT DETECTED Final   Haemophilus influenzae NOT DETECTED NOT DETECTED Final   Neisseria meningitidis NOT DETECTED NOT DETECTED Final   Pseudomonas aeruginosa NOT DETECTED NOT DETECTED Final   Candida albicans NOT DETECTED NOT DETECTED Final   Candida glabrata NOT DETECTED NOT DETECTED Final   Candida krusei NOT  DETECTED NOT DETECTED Final   Candida parapsilosis NOT DETECTED NOT DETECTED Final   Candida tropicalis NOT DETECTED NOT DETECTED Final    Comment: Performed at [facility], [place] [address], [place], [place] [address]  MRSA PCR Screening     Status: None   Collection Time: 07/12/19 11:48 PM   Specimen: Nasal Mucosa; Nasopharyngeal  Result Value Ref Range Status   MRSA by PCR NEGATIVE NEGATIVE Final    Comment:        The GeneXpert MRSA Assay (FDA approved for NASAL specimens only), is one component of a comprehensive MRSA colonization surveillance program. It is not intended to diagnose MRSA infection nor to guide or monitor treatment for MRSA infections. Performed at [facility], [place] [address], [place], [place] [address]   Culture, blood (routine x 2)     Status: None (Preliminary result)   Collection Time: 07/15/19  1:29 PM   Specimen: BLOOD RIGHT HAND  Result Value Ref Range Status   Specimen Description BLOOD RIGHT HAND  Final   Special Requests   Final    BOTTLES DRAWN AEROBIC ONLY Blood Culture adequate volume   Culture   Final    NO GROWTH 2 DAYS Performed at [facility], [place] [address], [place], [place] [address]    Report Status PENDING  Incomplete  Culture, blood (routine x 2)     Status: None (Preliminary result)   Collection Time: 07/15/19  1:29 PM   Specimen: BLOOD RIGHT HAND  Result Value Ref Range Status   Specimen Description BLOOD RIGHT HAND  Final   Special Requests   Final    BOTTLES DRAWN AEROBIC ONLY Blood Culture adequate volume   Culture   Final    NO GROWTH 2 DAYS Performed at [facility], [place] [address] [place], [place] [address]    Report Status PENDING  Incomplete         Radiology Studies: Dg Chest 2 View  Result Date: 07/16/2019 CLINICAL DATA:  Chest pain in a patient with pneumonia. EXAM: CHEST - 2 VIEW COMPARISON:  CT chest 07/13/2019 single-view of the chest 07/12/2019. FINDINGS: Right greater than left pleural effusions and airspace disease have worsened. There is cardiomegaly. No pneumothorax. No acute or focal bony abnormality. IMPRESSION: Worsened right greater than left pleural effusions and airspace disease. Cardiomegaly. Electronically Signed   By: Inge Rise M.D.   On: 07/16/2019 10:05   Ct Head Wo Contrast  Result Date: 07/17/2019 CLINICAL DATA:  Altered mental status EXAM: CT HEAD WITHOUT CONTRAST TECHNIQUE: Contiguous axial images were obtained from the base of the skull through the vertex without intravenous contrast. COMPARISON:  None. FINDINGS: Brain: No evidence of acute infarction, hemorrhage, hydrocephalus, extra-axial collection or mass lesion/mass effect. Extensive periventricular and deep white matter hypodensity. Vascular: No hyperdense vessel or unexpected calcification. Skull: Normal. Negative for fracture or focal lesion. Sinuses/Orbits: Extensive paranasal sinus mucosal thickening and opacification, status post bilateral maxillary antrostomy. Other: None. IMPRESSION: No acute intracranial pathology.  Small-vessel white matter disease. Electronically Signed   By: Eddie Candle M.D.   On: 07/17/2019 14:46        Scheduled Meds: . furosemide  40 mg Intravenous BID   And  . potassium chloride  20 mEq Oral BID  . mouth rinse  15 mL Mouth Rinse BID  . mometasone-formoterol  2 puff Inhalation BID  . montelukast  10 mg Oral QHS  . thiamine  100 mg Oral  Daily   Continuous Infusions: . sodium chloride    . penicillin g continuous IV infusion 12 Million Units (07/17/19 2337)     LOS: 6 days     Cordelia Poche, MD [facility] 07/18/2019, 10:01 AM  If 7PM-7AM, please contact night-coverage [email or web address]

## 2019-07-19 ENCOUNTER — Inpatient Hospital Stay (HOSPITAL_COMMUNITY): Payer: Medicaid Other

## 2019-07-19 LAB — BASIC METABOLIC PANEL
Anion gap: 12 (ref 5–15)
BUN: <5 mg/dL — ABNORMAL LOW (ref 8–23)
CO2: 28 mmol/L (ref 22–32)
Calcium: 8.1 mg/dL — ABNORMAL LOW (ref 8.9–10.3)
Chloride: 94 mmol/L — ABNORMAL LOW (ref 98–111)
Creatinine, Ser: 0.55 mg/dL (ref 0.44–1.00)
GFR calc Af Amer: >60 mL/min (ref >60)
GFR calc non Af Amer: >60 mL/min (ref >60)
Glucose, Bld: 91 mg/dL (ref 70–99)
Potassium: 3.6 mmol/L (ref 3.5–5.1)
Sodium: 134 mmol/L — ABNORMAL LOW (ref 135–145)

## 2019-07-19 LAB — CBC
HCT: 26.5 % — ABNORMAL LOW (ref 36.0–46.0)
Hemoglobin: 8.4 g/dL — ABNORMAL LOW (ref 12.0–15.0)
MCH: 24.8 pg — ABNORMAL LOW (ref 26.0–34.0)
MCHC: 31.7 g/dL (ref 30.0–36.0)
MCV: 78.2 fL — ABNORMAL LOW (ref 80.0–100.0)
Platelets: 793 10*3/uL — ABNORMAL HIGH (ref 150–400)
RBC: 3.39 MIL/uL — ABNORMAL LOW (ref 3.87–5.11)
RDW: 27 % — ABNORMAL HIGH (ref 11.5–15.5)
WBC: 11.1 10*3/uL — ABNORMAL HIGH (ref 4.0–10.5)
nRBC: 0.2 % (ref 0.0–0.2)

## 2019-07-19 MED ORDER — ACETAMINOPHEN 325 MG PO TABS
650.0000 mg | ORAL_TABLET | Freq: Once | ORAL | Status: AC
  Administered 2019-07-19: 650 mg via ORAL
  Filled 2019-07-19: qty 2

## 2019-07-19 MED ORDER — PNEUMOCOCCAL VAC POLYVALENT 25 MCG/0.5ML IJ INJ
0.5000 mL | INJECTION | INTRAMUSCULAR | Status: AC
  Administered 2019-07-20: 0.5 mL via INTRAMUSCULAR
  Filled 2019-07-19: qty 0.5

## 2019-07-19 MED ORDER — TRAZODONE HCL 50 MG PO TABS
50.0000 mg | ORAL_TABLET | Freq: Every evening | ORAL | Status: DC | PRN
  Administered 2019-07-19 – 2019-07-20 (×2): 50 mg via ORAL
  Filled 2019-07-19 (×2): qty 1

## 2019-07-19 MED ORDER — LACTULOSE 10 GM/15ML PO SOLN
20.0000 g | Freq: Two times a day (BID) | ORAL | Status: DC
  Administered 2019-07-19 – 2019-07-21 (×5): 20 g via ORAL
  Filled 2019-07-19 (×5): qty 30

## 2019-07-19 NOTE — Progress Notes (Signed)
Physical Therapy Treatment Patient Details Name: Angel Hebert MRN: [id number] DOB: [birth year] Today's Date: 07/19/2019    History of Present Illness Angel Hebert is a 63 y.o. female with history of hypertension, GERD, asthma, osteoarthritis. Patient presented secondary to right flank pain and found to have multifocal pneumonia and now strep pneumoniae bacteremia.    PT Comments    Pt denied flank pain during session today. Ambulated 15' then 73' with RW and min-guard A, HR 115 bpm first walk then 125 bpm after second. SpO2 remained >90% on RA. Pt would benefit from rollator for home for energy conservation. Pt relays that the owner of the home where she lives is in the house with she and her boyfriend and can help her when her boyfriend is at work. PT will continue to follow.    Follow Up Recommendations  Home health PT;Supervision/Assistance - 24 hour     Equipment Recommendations  Other (comment)(rollator with seat)    Recommendations for Other Services OT consult     Precautions / Restrictions Precautions Precautions: Fall Restrictions Weight Bearing Restrictions: No    Mobility  Bed Mobility Overal bed mobility: Modified Independent                Transfers Overall transfer level: Needs assistance Equipment used: Rolling walker (2 wheeled) Transfers: Sit to/from Stand Sit to Stand: Supervision         General transfer comment: vc's for hand placement  Ambulation/Gait Ambulation/Gait assistance: Min guard Gait Distance (Feet): 45 Feet(15, 30) Assistive device: Rolling walker (2 wheeled) Gait Pattern/deviations: Step-through pattern;Decreased stride length Gait velocity: decreased Gait velocity interpretation: <1.8 ft/sec, indicate of risk for recurrent falls General Gait Details: pt's HR 115bpm after 15', 125 bpm after 30' walk, SpO2 >98% on RA, 2/4 DOE. Discussed use of rollator for ambulation to allow for energy conservation   Stairs              Wheelchair Mobility    Modified Rankin (Stroke Patients Only)       Balance Overall balance assessment: Needs assistance Sitting-balance support: Feet supported Sitting balance-Leahy Scale: Good     Standing balance support: During functional activity Standing balance-Leahy Scale: Fair Standing balance comment: able to maintain static stance without AD                            Cognition Arousal/Alertness: Awake/alert Behavior During Therapy: WFL for tasks assessed/performed Overall Cognitive Status: Impaired/Different from baseline Area of Impairment: Orientation;Attention;Memory;Following commands;Safety/judgement;Awareness;Problem solving                 Orientation Level: Disoriented to;Time Current Attention Level: Selective Memory: Decreased short-term memory Following Commands: Follows one step commands with increased time Safety/Judgement: Decreased awareness of safety;Decreased awareness of deficits Awareness: Emergent Problem Solving: Slow processing;Decreased initiation;Difficulty sequencing General Comments: pt with improved awareness today and more oriented, talking about Thanksgiving and going home. Pt also relays that the owner of her home also lives there and can help her while her boyfriend is at work      Training and development officer - Lower Extremity Hip Flexion/Marching: AROM;Both;10 reps;Standing Mini-Sqauts: AROM;10 reps    General Comments General comments (skin integrity, edema, etc.): O2 left off after session and RN notified      Pertinent Vitals/Pain Pain Assessment: No/denies pain    Home Living  Prior Function            PT Goals (current goals can now be found in the care plan section) Acute Rehab PT Goals Patient Stated Goal: return home PT Goal Formulation: With patient Time For Goal Achievement: 07/30/19 Potential to Achieve Goals: Good Progress towards PT goals:  Progressing toward goals    Frequency    Min 3X/week      PT Plan Current plan remains appropriate;Equipment recommendations need to be updated    Co-evaluation              AM-PAC PT "6 Clicks" Mobility   Outcome Measure  Help needed turning from your back to your side while in a flat bed without using bedrails?: None Help needed moving from lying on your back to sitting on the side of a flat bed without using bedrails?: None Help needed moving to and from a bed to a chair (including a wheelchair)?: A Little Help needed standing up from a chair using your arms (e.g., wheelchair or bedside chair)?: A Little Help needed to walk in hospital room?: A Little Help needed climbing 3-5 steps with a railing? : A Lot 6 Click Score: 19    End of Session Equipment Utilized During Treatment: Gait belt Activity Tolerance: Patient tolerated treatment well Patient left: in bed;with call bell/phone within reach;with bed alarm set Nurse Communication: Mobility status PT Visit Diagnosis: Difficulty in walking, not elsewhere classified (R26.2);Muscle weakness (generalized) (M62.81)     Time: [id number] PT Time Calculation (min) (ACUTE ONLY): 18 min  Charges:  $Gait Training: 8-22 mins                     Leighton Roach, [place]  Pager [phone] Office [place] 07/19/2019, 2:09 PM

## 2019-07-19 NOTE — Progress Notes (Signed)
PROGRESS NOTE    Angel Hebert  [id number] DOB: Dec 19, 1955 DOA: 07/12/2019 PCP: Charlott Rakes, MD   Brief Narrative: Angel Hebert is a 63 y.o. female with history of hypertension, GERD, asthma, osteoarthritis. Patient presented secondary to right flank pain and found to have multifocal pneumonia and now strep pneumoniae bacteremia.   Assessment & Plan:   Principal Problem:   Symptomatic anemia Active Problems:   Hyperlipidemia   Essential hypertension   GERD   Sepsis ([place])   Hypokalemia   Lobar pneumonia ([place])   Strep bacteremia Patient transitioned to Ceftriaxone. Transthoracic Echocardiogram not suggestive of vegetations. Repeat blood cultures (11/12) with no growth to date. Fever overnight -ID recommendations: Penicillin G IV  Symptomatic anemia Baseline hemoglobin difficult to quantify but possible baseline is between 9-10. Hemoglobin of 5.2 on admission and is improved s/p 2 units of PRBC. Hemoglobin currently stable.  Multifocal pneumonia Strep pneumo positive. Emprically treated with Vancomycin and Cefepime, transitioned to Ceftriaxone and now to Penicillin G. Patient continues to have tachypnea and oxygen requirement but overall moving in positive direction -Continue Penicillin per ID recommendations  Acute systolic heart failure EF of 45-50% on recent Transthoracic Echocardiogram. -Continue Lasix 40 mg IV BID  Acute respiratory failure with hypoxia Secondary to pneumonia. Improving slowly -Wean to room air  Confusion Unsure of etiology. Possibly related to alcohol withdrawal although symptoms are not very significant. Discussed with sister who states the patient has had some cognitive concerns in the past but it is much worse now. This appears to wax and wane. CT head unremarkable for acute process but significant for small vessel white matter disease and extensive periventricular/deep white matter hypodensity  Abdominal distension Improved  with bowel movement.  Sepsis Present on admission. Secondary to above. Resolved.  Leukocytosis Secondary to bacteremia/pneumonia. Improving. -CBC daily  Thrombocytosis Likely secondary to bacteremia. Anticipate improvement with treatment.  Hypoalbuminemia Likely secondary to poor nutrition. Dietician consult.  Hypokalemia Replete as needed. Associated hypomagnesemia -Replete as needed  Hypertension Slight uncontrolled. On Hyzaar as an outpatient.  Hyperlipidemia  On Lipitor as an outpatient  GERD On Protonix as an outpatient  L3 compression fracture Subacute. -Pain management    DVT prophylaxis: SCDs Code Status:   Code Status: DNR Family Communication: None at bedside Disposition Plan: Discharge pending antibiotic regimen recommendations and resolution of hypoxia   Consultants:   None  Procedures:  11/10: Transthoracic Echocardiogram   IMPRESSIONS    1. Left ventricular ejection fraction, by visual estimation, is 45 to 50%. The left ventricle has normal function. There is no left ventricular hypertrophy.  2. Left ventricular diastolic parameters are consistent with Grade I diastolic dysfunction (impaired relaxation).  3. The left ventricle demonstrates global hypokinesis.  4. Global right ventricle has normal systolic function.The right ventricular size is normal. No increase in right ventricular wall thickness.  5. Left atrial size was normal.  6. Right atrial size was normal.  7. Trivial pericardial effusion is present.  8. The mitral valve is normal in structure. Mild mitral valve regurgitation. No evidence of mitral stenosis.  9. The tricuspid valve is normal in structure. Tricuspid valve regurgitation is trivial. 10. The aortic valve is tricuspid. Aortic valve regurgitation is not visualized. Mild aortic valve sclerosis without stenosis. 11. TR signal is inadequate for assessing pulmonary artery systolic pressure. 12. The inferior vena cava is  dilated in size with >50% respiratory variability, suggesting right atrial pressure of 8 mmHg.  FINDINGS  Left Ventricle: Left ventricular ejection fraction, by visual estimation,  is 45 to 50%. The left ventricle has normal function. The left ventricle demonstrates global hypokinesis. There is no left ventricular hypertrophy. Left ventricular diastolic  parameters are consistent with Grade I diastolic dysfunction (impaired relaxation).  Right Ventricle: The right ventricular size is normal. No increase in right ventricular wall thickness. Global RV systolic function is has normal systolic function.  Left Atrium: Left atrial size was normal in size.  Right Atrium: Right atrial size was normal in size  Pericardium: Trivial pericardial effusion is present.  Mitral Valve: The mitral valve is normal in structure. No evidence of mitral valve stenosis by observation. Mild mitral valve regurgitation.  Tricuspid Valve: The tricuspid valve is normal in structure. Tricuspid valve regurgitation is trivial.  Aortic Valve: The aortic valve is tricuspid. Aortic valve regurgitation is not visualized. Mild aortic valve sclerosis is present, with no evidence of aortic valve stenosis.  Pulmonic Valve: The pulmonic valve was normal in structure. Pulmonic valve regurgitation is trivial.  Aorta: The aortic root is normal in size and structure.  Venous: The inferior vena cava is dilated in size with greater than 50% respiratory variability, suggesting right atrial pressure of 8 mmHg.  IAS/Shunts: No atrial level shunt detected by color flow Doppler.  Antimicrobials:  Vancomycin  Cefepime  Ceftriaxone    Subjective: No concerns today. Feels great. Wants to go home.  Objective: Vitals:   07/18/19 2158 07/19/19 0520 07/19/19 0718 07/19/19 1001  BP: 124/81 123/87    Pulse: (!) 108 97    Resp: 18 18    Temp: 98.9 F (37.2 C) (!) 100.4 F (38 C)  97.8 F (36.6 C)  TempSrc: Oral Oral   Oral  SpO2: 98% 99% 98%   Weight: 66.8 kg     Height:        Intake/Output Summary (Last 24 hours) at 07/19/2019 1016 Last data filed at 07/18/2019 1843 Gross per 24 hour  Intake 500 ml  Output 1801 ml  Net -1301 ml   Filed Weights   07/15/19 0432 07/18/19 0705 07/18/19 2158  Weight: 69.9 kg 68.8 kg 66.8 kg    Examination:  General exam: Appears calm and comfortable Respiratory system: Rales on right. Clear left sided breath sounds. Respiratory effort normal. Cardiovascular system: S1 & S2 heard, RRR. No murmurs, rubs, gallops or clicks. Gastrointestinal system: Abdomen is nondistended, soft and nontender. No organomegaly or masses felt. Normal bowel sounds heard. Central nervous system: Alert and oriented. No focal neurological deficits. Extremities: No edema. No calf tenderness Skin: No cyanosis. No rashes Psychiatry: Judgement and insight appear normal. Mood & affect appropriate.     Data Reviewed: I have personally reviewed following labs and imaging studies  CBC: Recent Labs  Lab 07/12/19 1326 07/13/19 0806 07/14/19 0240 07/15/19 1329 07/16/19 1030 07/17/19 0747 07/18/19 0306 07/19/19 0243  WBC 38.1* 29.6* 22.0* 17.4* 15.4* 12.0* 12.6* 11.1*  NEUTROABS 36.6* 25.8* 18.3*  --   --   --   --   --   HGB 5.2* 8.3* 8.8* 9.0* 8.5* 8.6* 8.2* 8.4*  HCT 17.3* 25.9* 28.4* 28.3* 27.2* 27.7* 26.4* 26.5*  MCV 73.3* 75.5* 77.8* 76.3* 78.8* 78.2* 78.8* 78.2*  PLT 590* 620* 593* 800* 819* 830* 800* 0000000*   Basic Metabolic Panel: Recent Labs  Lab 07/12/19 1323  07/13/19 0806 07/14/19 0240 07/15/19 1329 07/16/19 1030 07/17/19 0747 07/17/19 1153 07/18/19 0306 07/19/19 0243  NA  --    < > 146* 145 141 138 140  --  136 134*  K  --    < > 2.9* 3.2* 2.8* 2.7* 2.9* 3.2* 3.3* 3.6  CL  --    < > 114* 114* 108 102 99  --  97* 94*  CO2  --    < > 20* 19* 22 24 29   --  28 28  GLUCOSE  --    < > 120* 86 92 137* 102*  --  103* 91  BUN  --    < > 14 12 6* <5* <5*  --  <5* <5*   CREATININE  --    < > 0.49 0.62 0.53 0.54 0.47  --  0.50 0.55  CALCIUM  --    < > 8.4* 8.4* 8.3* 7.8* 7.5*  --  7.6* 8.1*  MG 2.0  --  1.9 1.7  --   --  1.3*  --   --   --    < > = values in this interval not displayed.   GFR: Estimated Creatinine Clearance: 70.9 mL/min (by C-G formula based on SCr of 0.55 mg/dL). Liver Function Tests: Recent Labs  Lab 07/12/19 1326 07/13/19 0806 07/14/19 0240  AST 70* 47* 33  ALT 21 18 18   ALKPHOS 83 79 79  BILITOT 1.9* 3.1* 1.4*  PROT 7.6 6.9 7.1  ALBUMIN 2.3* 1.9* 2.0*   Recent Labs  Lab 07/12/19 1326  LIPASE 286*   Recent Labs  Lab 07/17/19 1153  AMMONIA 62*   Coagulation Profile: No results for input(s): INR, PROTIME in the last 168 hours. Cardiac Enzymes: No results for input(s): CKTOTAL, CKMB, CKMBINDEX, TROPONINI in the last 168 hours. BNP (last 3 results) No results for input(s): PROBNP in the last 8760 hours. HbA1C: No results for input(s): HGBA1C in the last 72 hours. CBG: No results for input(s): GLUCAP in the last 168 hours. Lipid Profile: No results for input(s): CHOL, HDL, LDLCALC, TRIG, CHOLHDL, LDLDIRECT in the last 72 hours. Thyroid Function Tests: No results for input(s): TSH, T4TOTAL, FREET4, T3FREE, THYROIDAB in the last 72 hours. Anemia Panel: No results for input(s): VITAMINB12, FOLATE, FERRITIN, TIBC, IRON, RETICCTPCT in the last 72 hours. Sepsis Labs: Recent Labs  Lab 07/12/19 1524 07/12/19 2114 07/13/19 0806 07/14/19 0240 07/15/19 0324  PROCALCITON  --   --  4.82 3.53 1.68  LATICACIDVEN 1.8 1.5  --   --   --     Recent Results (from the past 240 hour(s))  SARS CORONAVIRUS 2 (TAT 6-24 HRS) Nasopharyngeal Nasopharyngeal Swab     Status: None   Collection Time: 07/12/19  1:25 PM   Specimen: Nasopharyngeal Swab  Result Value Ref Range Status   SARS Coronavirus 2 NEGATIVE NEGATIVE Final    Comment: (NOTE) SARS-CoV-2 target nucleic acids are NOT DETECTED. The SARS-CoV-2 RNA is generally detectable  in upper and lower respiratory specimens during the acute phase of infection. Negative results do not preclude SARS-CoV-2 infection, do not rule out co-infections with other pathogens, and should not be used as the sole basis for treatment or other patient management decisions. Negative results must be combined with clinical observations, patient history, and epidemiological information. The expected result is Negative. Fact Sheet for Patients: SugarRoll.be Fact Sheet for Healthcare Providers: https://www.woods-mathews.com/ This test is not yet approved or cleared by the [place] FDA and  has been authorized for detection and/or diagnosis of SARS-CoV-2 by FDA under an Emergency Use Authorization (EUA). This EUA will remain  in effect (meaning this test can be used) for the duration of the COVID-19  declaration under Section 56 4(b)(1) of the Act, 21 U.S.C. section 360bbb-3(b)(1), unless the authorization is terminated or revoked sooner. Performed at [facility], [place] [address], [place], [place] [address]   Blood culture (routine x 2)     Status: Abnormal   Collection Time: 07/12/19  1:30 PM   Specimen: BLOOD  Result Value Ref Range Status   Specimen Description BLOOD LEFT ANTECUBITAL  Final   Special Requests   Final    BOTTLES DRAWN AEROBIC AND ANAEROBIC Blood Culture adequate volume   Culture  Setup Time   Final    GRAM POSITIVE COCCI IN CHAINS AEROBIC BOTTLE ONLY CRITICAL RESULT CALLED TO, READ BACK BY AND VERIFIED WITH: PHARMD J FRAENS [id number] AT 71 AM    Culture (A)  Final    STREPTOCOCCUS PNEUMONIAE SUSCEPTIBILITIES PERFORMED ON PREVIOUS CULTURE WITHIN THE LAST 5 DAYS. Performed at [facility], [place] [address], [place], [place] [address]    Report Status 07/15/2019 FINAL  Final  Blood culture (routine x 2)     Status: Abnormal   Collection Time: 07/12/19  1:30 PM   Specimen: BLOOD  Result Value Ref Range Status    Specimen Description BLOOD RIGHT ANTECUBITAL  Final   Special Requests   Final    BOTTLES DRAWN AEROBIC AND ANAEROBIC Blood Culture adequate volume   Culture  Setup Time   Final    GRAM POSITIVE COCCI IN CHAINS IN BOTH AEROBIC AND ANAEROBIC BOTTLES CRITICAL RESULT CALLED TO, READ BACK BY AND VERIFIED WITHDaun Peacock, PHARMD, AT 1048 07/13/19 BY D. VANHOOK Performed at [facility], [place] [address], [place], [place] [address]    Culture STREPTOCOCCUS PNEUMONIAE (A)  Final   Report Status 07/15/2019 FINAL  Final   Organism ID, Bacteria STREPTOCOCCUS PNEUMONIAE  Final      Susceptibility   Streptococcus pneumoniae - MIC*    ERYTHROMYCIN <=0.12 SENSITIVE Sensitive     LEVOFLOXACIN 1 SENSITIVE Sensitive     VANCOMYCIN 0.25 SENSITIVE Sensitive     PENICILLIN (non-meningitis) <=0.06 SENSITIVE Sensitive     CEFTRIAXONE (non-meningitis) <=0.12 SENSITIVE Sensitive     * STREPTOCOCCUS PNEUMONIAE  Blood Culture ID Panel (Reflexed)     Status: Abnormal   Collection Time: 07/12/19  1:30 PM  Result Value Ref Range Status   Enterococcus species NOT DETECTED NOT DETECTED Final   Listeria monocytogenes NOT DETECTED NOT DETECTED Final   Staphylococcus species NOT DETECTED NOT DETECTED Final   Staphylococcus aureus (BCID) NOT DETECTED NOT DETECTED Final   Streptococcus species DETECTED (A) NOT DETECTED Final    Comment: CRITICAL RESULT CALLED TO, READ BACK BY AND VERIFIED WITH: PHARMD J FRAENS [id number] AT 847 AM BY CM    Streptococcus agalactiae NOT DETECTED NOT DETECTED Final   Streptococcus pneumoniae DETECTED (A) NOT DETECTED Final    Comment: CRITICAL RESULT CALLED TO, READ BACK BY AND VERIFIED WITH: PHARMD J FRAENS [id number] AT 847 AM BY CM    Streptococcus pyogenes NOT DETECTED NOT DETECTED Final   Acinetobacter baumannii NOT DETECTED NOT DETECTED Final   Enterobacteriaceae species NOT DETECTED NOT DETECTED Final   Enterobacter cloacae complex NOT DETECTED NOT DETECTED Final    Escherichia coli NOT DETECTED NOT DETECTED Final   Klebsiella oxytoca NOT DETECTED NOT DETECTED Final   Klebsiella pneumoniae NOT DETECTED NOT DETECTED Final   Proteus species NOT DETECTED NOT DETECTED Final   Serratia marcescens NOT DETECTED NOT DETECTED Final   Haemophilus influenzae NOT DETECTED  NOT DETECTED Final   Neisseria meningitidis NOT DETECTED NOT DETECTED Final   Pseudomonas aeruginosa NOT DETECTED NOT DETECTED Final   Candida albicans NOT DETECTED NOT DETECTED Final   Candida glabrata NOT DETECTED NOT DETECTED Final   Candida krusei NOT DETECTED NOT DETECTED Final   Candida parapsilosis NOT DETECTED NOT DETECTED Final   Candida tropicalis NOT DETECTED NOT DETECTED Final    Comment: Performed at [facility], [place] [address], [place], [place] [address]  MRSA PCR Screening     Status: None   Collection Time: 07/12/19 11:48 PM   Specimen: Nasal Mucosa; Nasopharyngeal  Result Value Ref Range Status   MRSA by PCR NEGATIVE NEGATIVE Final    Comment:        The GeneXpert MRSA Assay (FDA approved for NASAL specimens only), is one component of a comprehensive MRSA colonization surveillance program. It is not intended to diagnose MRSA infection nor to guide or monitor treatment for MRSA infections. Performed at [facility], [place] [address], [place], [place] [address]   Culture, blood (routine x 2)     Status: None (Preliminary result)   Collection Time: 07/15/19  1:29 PM   Specimen: BLOOD RIGHT HAND  Result Value Ref Range Status   Specimen Description BLOOD RIGHT HAND  Final   Special Requests   Final    BOTTLES DRAWN AEROBIC ONLY Blood Culture adequate volume   Culture   Final    NO GROWTH 3 DAYS Performed at [facility], [place] [address], [place], [place] [address]    Report Status PENDING  Incomplete  Culture, blood (routine x 2)     Status: None (Preliminary result)   Collection Time: 07/15/19  1:29 PM   Specimen: BLOOD RIGHT HAND  Result Value  Ref Range Status   Specimen Description BLOOD RIGHT HAND  Final   Special Requests   Final    BOTTLES DRAWN AEROBIC ONLY Blood Culture adequate volume   Culture   Final    NO GROWTH 3 DAYS Performed at [facility], [place] [address], [place], [place] [address]    Report Status PENDING  Incomplete         Radiology Studies: Ct Head Wo Contrast  Result Date: 07/17/2019 CLINICAL DATA:  Altered mental status EXAM: CT HEAD WITHOUT CONTRAST TECHNIQUE: Contiguous axial images were obtained from the base of the skull through the vertex without intravenous contrast. COMPARISON:  None. FINDINGS: Brain: No evidence of acute infarction, hemorrhage, hydrocephalus, extra-axial collection or mass lesion/mass effect. Extensive periventricular and deep white matter hypodensity. Vascular: No hyperdense vessel or unexpected calcification. Skull: Normal. Negative for fracture or focal lesion. Sinuses/Orbits: Extensive paranasal sinus mucosal thickening and opacification, status post bilateral maxillary antrostomy. Other: None. IMPRESSION: No acute intracranial pathology.  Small-vessel white matter disease. Electronically Signed   By: Eddie Candle M.D.   On: 07/17/2019 14:46        Scheduled Meds: . furosemide  40 mg Intravenous BID   And  . potassium chloride  20 mEq Oral BID  . lactulose  20 g Oral BID  . mouth rinse  15 mL Mouth Rinse BID  . mometasone-formoterol  2 puff Inhalation BID  . montelukast  10 mg Oral QHS  . thiamine  100 mg Oral Daily   Continuous Infusions: . sodium chloride    . penicillin g continuous IV infusion 12 Million Units (07/19/19 0031)     LOS: 7 days  C[place]elia Poche, MD [facility] 07/19/2019, 10:16 AM  If 7PM-7AM, please contact night-coverage [email or web address]

## 2019-07-19 NOTE — TOC Initial Note (Signed)
Transition of Care [facility]) - Initial/Assessment Note    Patient Details  Name: Angel Hebert MRN: [id number] Date of Birth: 10/30/1955  Transition of Care [facility] CM/SW Contact:    Gelene Mink, [place] Phone Number: 07/19/2019, 4:58 PM  Clinical Narrative:                  CSW received a phone call from the patient's sister this morning, expressing her concern. She was wanting her sister to come stay with her post discharge from the hospital. CSW shared that they could not force the patient to do that, she stated she understood was just concerned.   CSW met with the patient at bedside.CSW introduced herself and explained her role. CSW shared therapy recommendation. CSW stated since the patient has medicaid, it would be less likely for a home health agency to pick her up. She is agreeable to outpatient rehab. She stated her boyfriend could provide transportation and she could utilize [employer] transportation. CSW provided transportation and homeless resources. The patient identified that she lives with her significant other and denied being homeless.   Patient will need orders for outpatient rehab. CSW will continue to follow and assist with discharge.   Expected Discharge Plan: OP Rehab Barriers to Discharge: Continued Medical Work up   Patient Goals and CMS Choice Patient states their goals for this hospitalization and ongoing recovery are:: Pt agreeable to outpatient rehab CMS Medicare.gov Compare Post Acute Care list provided to:: Patient Choice offered to / list presented to : Patient  Expected Discharge Plan and Services Expected Discharge Plan: OP Rehab In-house Referral: Clinical Social Work Discharge Planning Services: NA Post Acute Care Choice: (Outpatient Rehab) Living arrangements for the past 2 months: Single Family Home                 DME Arranged: N/A DME Agency: NA                  Prior Living Arrangements/Services Living arrangements for the past 2  months: Single Family Home Lives with:: Significant Other Patient language and need for interpreter reviewed:: No Do you feel safe going back to the place where you live?: Yes      Need for Family Participation in Patient Care: No (Comment) Care giver support system in place?: No (comment)   Criminal Activity/Legal Involvement Pertinent to Current Situation/Hospitalization: No - Comment as needed  Activities of Daily Living Home Assistive Devices/Equipment: None ADL Screening (condition at time of admission) Patient's cognitive ability adequate to safely complete daily activities?: Yes Is the patient deaf or have difficulty hearing?: No Does the patient have difficulty seeing, even when wearing glasses/contacts?: No Does the patient have difficulty concentrating, remembering, or making decisions?: No Patient able to express need for assistance with ADLs?: Yes Does the patient have difficulty dressing or bathing?: Yes Independently performs ADLs?: No Communication: Independent Dressing (OT): Needs assistance Is this a change from baseline?: Change from baseline, expected to last <3days Grooming: Needs assistance Is this a change from baseline?: Change from baseline, expected to last <3 days Feeding: Independent Bathing: Needs assistance Is this a change from baseline?: Change from baseline, expected to last <3 days Toileting: Needs assistance Is this a change from baseline?: Change from baseline, expected to last <3 days In/Out Bed: Needs assistance Is this a change from baseline?: Change from baseline, expected to last <3 days Walks in Home: Independent Does the patient have difficulty walking or climbing stairs?: No Weakness of Legs: Both  Weakness of Arms/Hands: None  Permission Sought/Granted Permission sought to share information with : Case Manager Permission granted to share information with : Yes, Verbal Permission Granted              Emotional  Assessment Appearance:: Appears stated age Attitude/Demeanor/Rapport: Engaged Affect (typically observed): Appropriate Orientation: : Oriented to Self, Oriented to Place, Oriented to  Time, Oriented to Situation Alcohol / Substance Use: Not Applicable Psych Involvement: No (comment)  Admission diagnosis:  Hypokalemia [E87.6] Right upper quadrant abdominal pain [R10.11] Anemia, unspecified type [D64.9] Patient Active Problem List   Diagnosis Date Noted  . Symptomatic anemia 07/12/2019  . Hypokalemia 07/12/2019  . Lobar pneumonia ([place]) 07/12/2019  . Insomnia 03/03/2017  . 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 Presence of left artificial knee joint 10/14/2016  . Arthritis of knee 10/01/2016  . Unilateral primary osteoarthritis, left knee 12/13/2015  . Benign essential HTN   . Physical deconditioning   . Anemia, iron deficiency   . Pressure ulcer 10/14/2015  . CAP (community acquired pneumonia)   . Pleural effusion   . Sepsis ([place])   . Sepsis due to Streptococcus pneumoniae ([place])   . Bacteremia   . Community acquired pneumonia 10/08/2015  . Acute sinusitis 12/02/2014  . Sinusitis 03/17/2013  . Hyperlipidemia 03/02/2007  . Essential hypertension 03/02/2007  . Allergic rhinitis 03/02/2007  . Asthma 03/02/2007  . GERD 03/02/2007   PCP:  Charlott Rakes, MD Pharmacy:   [facility], [address] [place] [address] Phone: [phone] Fax: [phone]  [id number] [id number]: [phone]: [phone]    Social Determinants of Health (SDOH) Interventions    Readmission Risk Interventions No flowsheet data found.

## 2019-07-20 ENCOUNTER — Inpatient Hospital Stay (HOSPITAL_COMMUNITY): Payer: Medicaid Other

## 2019-07-20 LAB — BASIC METABOLIC PANEL
Anion gap: 12 (ref 5–15)
BUN: <5 mg/dL — ABNORMAL LOW (ref 8–23)
CO2: 26 mmol/L (ref 22–32)
Calcium: 8.3 mg/dL — ABNORMAL LOW (ref 8.9–10.3)
Chloride: 95 mmol/L — ABNORMAL LOW (ref 98–111)
Creatinine, Ser: 0.5 mg/dL (ref 0.44–1.00)
GFR calc Af Amer: >60 mL/min (ref >60)
GFR calc non Af Amer: >60 mL/min (ref >60)
Glucose, Bld: 96 mg/dL (ref 70–99)
Potassium: 3.3 mmol/L — ABNORMAL LOW (ref 3.5–5.1)
Sodium: 133 mmol/L — ABNORMAL LOW (ref 135–145)

## 2019-07-20 LAB — CULTURE, BLOOD (ROUTINE X 2)
Culture: NO GROWTH
Culture: NO GROWTH
Special Requests: ADEQUATE
Special Requests: ADEQUATE

## 2019-07-20 LAB — CBC
HCT: 28.5 % — ABNORMAL LOW (ref 36.0–46.0)
Hemoglobin: 8.8 g/dL — ABNORMAL LOW (ref 12.0–15.0)
MCH: 24.2 pg — ABNORMAL LOW (ref 26.0–34.0)
MCHC: 30.9 g/dL (ref 30.0–36.0)
MCV: 78.3 fL — ABNORMAL LOW (ref 80.0–100.0)
Platelets: 848 10*3/uL — ABNORMAL HIGH (ref 150–400)
RBC: 3.64 MIL/uL — ABNORMAL LOW (ref 3.87–5.11)
RDW: 26.5 % — ABNORMAL HIGH (ref 11.5–15.5)
WBC: 11.6 10*3/uL — ABNORMAL HIGH (ref 4.0–10.5)
nRBC: 0 % (ref 0.0–0.2)

## 2019-07-20 MED ORDER — POTASSIUM CHLORIDE 20 MEQ/15ML (10%) PO SOLN
30.0000 meq | Freq: Once | ORAL | Status: AC
  Administered 2019-07-20: 30 meq via ORAL
  Filled 2019-07-20: qty 30

## 2019-07-20 MED ORDER — LIDOCAINE HCL (PF) 1 % IJ SOLN
INTRAMUSCULAR | Status: DC | PRN
  Administered 2019-07-20: 5 mL

## 2019-07-20 MED ORDER — LIDOCAINE HCL 1 % IJ SOLN
INTRAMUSCULAR | Status: AC
  Filled 2019-07-20: qty 20

## 2019-07-20 NOTE — Progress Notes (Signed)
Patient presents for therapeutic and diagnostic right sided thoracentesis. [place] limited chest shows trace amount of pleural fluid noted. Insufficient to perform a safe thoracentesis. Procedure not performed.

## 2019-07-20 NOTE — Progress Notes (Signed)
Pt. Refused evening dose of Lasix.

## 2019-07-20 NOTE — Progress Notes (Signed)
Physical Therapy Treatment Patient Details Name: Angel Hebert MRN: [id number] DOB: 04-04-1956 Today's Date: 07/20/2019    History of Present Illness Angel Hebert is a 63 y.o. female with history of hypertension, GERD, asthma, osteoarthritis. Patient presented secondary to right flank pain and found to have multifocal pneumonia and now strep pneumoniae bacteremia.    PT Comments    Pt making progress with ambulation distance, ambulated 110' with RW and min A but continues to become tachycardic while doing so, HR up to 135 bpm, SpO2 96% on RA, causes pt to feel very fatigued. Pt with poor safety awareness that has not improved significantly, because of this, changing d/c rec to SNF for 24/7 supervision. PT will continue to follow.    Follow Up Recommendations  Supervision/Assistance - 24 hour;SNF     Equipment Recommendations  Other (comment)(rollator with seat)    Recommendations for Other Services OT consult     Precautions / Restrictions Precautions Precautions: Fall Restrictions Weight Bearing Restrictions: No    Mobility  Bed Mobility Overal bed mobility: Modified Independent             General bed mobility comments: increased time needed but no physical assist  Transfers Overall transfer level: Needs assistance Equipment used: Rolling walker (2 wheeled) Transfers: Sit to/from Stand Sit to Stand: Min assist;Mod assist         General transfer comment: min A from bed, mod A from low toilet with use of grab bar  Ambulation/Gait Ambulation/Gait assistance: Min guard Gait Distance (Feet): 110 Feet Assistive device: Rolling walker (2 wheeled) Gait Pattern/deviations: Step-through pattern;Decreased stride length Gait velocity: decreased Gait velocity interpretation: <1.8 ft/sec, indicate of risk for recurrent falls General Gait Details: pt continues to be tachycardic with ambulation, HR 135 after 110' with RW and min A. O2 sats 96% on RA.     Stairs             Wheelchair Mobility    Modified Rankin (Stroke Patients Only)       Balance Overall balance assessment: Needs assistance Sitting-balance support: Feet supported;No upper extremity supported Sitting balance-Leahy Scale: Good     Standing balance support: No upper extremity supported;During functional activity Standing balance-Leahy Scale: Poor Standing balance comment: poor due to decreased awareness, does not realize she is losing her balance until it is too late to catch herself and does not remember to take precautions                            Cognition Arousal/Alertness: Awake/alert Behavior During Therapy: WFL for tasks assessed/performed Overall Cognitive Status: Impaired/Different from baseline Area of Impairment: Following commands;Safety/judgement;Awareness;Problem solving                 Orientation Level: Disoriented to;Time Current Attention Level: Selective Memory: Decreased short-term memory Following Commands: Follows one step commands with increased time Safety/Judgement: Decreased awareness of safety;Decreased awareness of deficits Awareness: Emergent Problem Solving: Slow processing;Decreased initiation;Difficulty sequencing General Comments: pt does not remember working with this PT yesterday. Continues to have very poor safety awareness      Exercises      General Comments        Pertinent Vitals/Pain Pain Assessment: Faces Faces Pain Scale: Hurts little more Pain Location: knees Pain Descriptors / Indicators: Grimacing Pain Intervention(s): Repositioned    Home Living  Prior Function            PT Goals (current goals can now be found in the care plan section) Acute Rehab PT Goals Patient Stated Goal: return home PT Goal Formulation: With patient Time For Goal Achievement: 07/30/19 Potential to Achieve Goals: Good Progress towards PT goals: Progressing  toward goals    Frequency    Min 3X/week      PT Plan Discharge plan needs to be updated    Co-evaluation              AM-PAC PT "6 Clicks" Mobility   Outcome Measure  Help needed turning from your back to your side while in a flat bed without using bedrails?: None Help needed moving from lying on your back to sitting on the side of a flat bed without using bedrails?: None Help needed moving to and from a bed to a chair (including a wheelchair)?: A Little Help needed standing up from a chair using your arms (e.g., wheelchair or bedside chair)?: A Little Help needed to walk in hospital room?: A Little Help needed climbing 3-5 steps with a railing? : A Lot 6 Click Score: 19    End of Session Equipment Utilized During Treatment: Gait belt Activity Tolerance: Patient tolerated treatment well Patient left: in bed;with call bell/phone within reach;with bed alarm set Nurse Communication: Mobility status PT Visit Diagnosis: Difficulty in walking, not elsewhere classified (R26.2);Muscle weakness (generalized) (M62.81)     Time: [id number] PT Time Calculation (min) (ACUTE ONLY): 21 min  Charges:  $Gait Training: 8-22 mins                     Leighton Roach, [place]  Pager [phone] Office [place] 07/20/2019, 3:54 PM

## 2019-07-20 NOTE — Progress Notes (Signed)
  Speech Language Pathology Treatment: Dysphagia  Patient Details Name: Angel Hebert MRN: [id number] DOB: July 22, 1956 Today's Date: 07/20/2019 Time: 0900-0920 SLP Time Calculation (min) (ACUTE ONLY): 20 min  Assessment / Plan / Recommendation Clinical Impression  Pt seen at breakfast (dys 2/thin liquids) with focus on goals for diet tolerance. Pt exhibited no overt s/s aspiration with any texture. She was receptive to education regarding positioning, remaining upright after meals to facilitate esophageal clearing, and taking small bites/sips. Safe swallow precautions were updated at [facility]. Pt accepting of modified diet for energy conservation and lack of dentition, and was encouraged that diet could likely be advanced as her strength and endurance improve. Pt is anxious to be discharged.     HPI HPI: Angel Hebert is a 63 y.o. with hx of hypertension, GERD, asthma, osteoarthritis presented with right flank pain on 07/12/2019.  She was found to be hypotensive with leukocytosis and hemoglobin of 5.2.  CT abdomen and pelvis showed no acute findings but showed multifocal airspace consolidation throughout lung bases compatible with severe multilobar pneumonia along with subacute compression fracture of the superior endplate of the L3. Latest chest CT 11/10 showed Bilateral lower lobe and right middle lobe consolidative changes most consistent with pneumonia, small bilateral pleural effusions, and aortic atherosclerosis. Diet currently Dys2/thin.       SLP Plan  Continue with current plan of care       Recommendations  Diet recommendations: Dysphagia 2 (fine chop);Thin liquid Liquids provided via: Cup;Straw Medication Administration: Whole meds with puree Supervision: Patient able to self feed;Intermittent supervision to cue for compensatory strategies Compensations: Small sips/bites;Slow rate Postural Changes and/or Swallow Maneuvers: Seated upright 90 degrees                Oral  Care Recommendations: Oral care QID Follow up Recommendations: Skilled Nursing facility;24 hour supervision/assistance SLP Visit Diagnosis: Dysphagia, pharyngeal phase (R13.13) Plan: Continue with current plan of care       Enriqueta Shutter, [facility], [place] Pathologist Office: [phone] Pager: [phone]     Shonna Chock 07/20/2019, 9:21 AM

## 2019-07-20 NOTE — Progress Notes (Addendum)
PROGRESS NOTE    Angel Hebert  [id number] DOB: [birth year] DOA: 07/12/2019 PCP: Charlott Rakes, MD   Brief Narrative: Angel Hebert is a 63 y.o. female with history of hypertension, GERD, asthma, osteoarthritis. Patient presented secondary to right flank pain and found to have multifocal pneumonia and now strep pneumoniae bacteremia.   Assessment & Plan:   Principal Problem:   Symptomatic anemia Active Problems:   Hyperlipidemia   Essential hypertension   GERD   Sepsis ([place])   Hypokalemia   Lobar pneumonia ([place])   Strep bacteremia Patient transitioned to Ceftriaxone. Transthoracic Echocardiogram not suggestive of vegetations. Repeat blood cultures (11/12) with no growth to date. Tmax of 100.4 on 11/16.  -ID recommendations: Penicillin G IV -Will discuss with ID if further workup warranted with mild fevers vs continued monitoring  -Discussed with ID: Repeat Bcx, thoracentesis with culture and cell count  Symptomatic anemia Baseline hemoglobin difficult to quantify but possible baseline is between 9-10. Hemoglobin of 5.2 on admission and is improved s/p 2 units of PRBC. Hemoglobin currently stable.  Multifocal pneumonia Strep pneumo positive. Emprically treated with Vancomycin and Cefepime, transitioned to Ceftriaxone and now to Penicillin G. Patient weaned to room air -Continue Penicillin per ID recommendations  Acute systolic heart failure EF of 45-50% on recent Transthoracic Echocardiogram. -Continue Lasix 40 mg IV BID  Acute respiratory failure with hypoxia Secondary to pneumonia. Improving slowly -Wean to room air  Confusion Unsure of etiology. Thought possibly secondary to alcohol withdrawal. Discussed with sister who states the patient has had some cognitive concerns in the past but it is much worse now. This appears to wax and wane. CT head unremarkable for acute process but significant for small vessel white matter disease and extensive  periventricular/deep white matter hypodensity. Ammonia elevated at 63 and RUQ ultrasound unremarkable for liver pathology -Continue Lactulose 20g BID  Abdominal distension Improved with bowel movement.  Sepsis Present on admission. Secondary to above. Resolved.  Leukocytosis Secondary to bacteremia/pneumonia. Improving. -CBC daily  Thrombocytosis Likely secondary to bacteremia. Anticipate improvement with treatment.  Hypoalbuminemia Likely secondary to poor nutrition. Dietician consult.  Hypokalemia Replete as needed. Associated hypomagnesemia -Attached potassium dosing to lasix; extra dosing as needed  Hypertension Slight uncontrolled. On Hyzaar as an outpatient.  Hyperlipidemia  On Lipitor as an outpatient  GERD On Protonix as an outpatient  L3 compression fracture Subacute. -Pain management    DVT prophylaxis: SCDs Code Status:   Code Status: DNR Family Communication: None at bedside Disposition Plan: Discharge pending antibiotic regimen recommendations and resolution of hypoxia   Consultants:   Infectious disease  Procedures:  11/10: Transthoracic Echocardiogram   IMPRESSIONS    1. Left ventricular ejection fraction, by visual estimation, is 45 to 50%. The left ventricle has normal function. There is no left ventricular hypertrophy.  2. Left ventricular diastolic parameters are consistent with Grade I diastolic dysfunction (impaired relaxation).  3. The left ventricle demonstrates global hypokinesis.  4. Global right ventricle has normal systolic function.The right ventricular size is normal. No increase in right ventricular wall thickness.  5. Left atrial size was normal.  6. Right atrial size was normal.  7. Trivial pericardial effusion is present.  8. The mitral valve is normal in structure. Mild mitral valve regurgitation. No evidence of mitral stenosis.  9. The tricuspid valve is normal in structure. Tricuspid valve regurgitation is trivial.  10. The aortic valve is tricuspid. Aortic valve regurgitation is not visualized. Mild aortic valve sclerosis without stenosis. 11. TR signal is inadequate  for assessing pulmonary artery systolic pressure. 12. The inferior vena cava is dilated in size with >50% respiratory variability, suggesting right atrial pressure of 8 mmHg.  FINDINGS  Left Ventricle: Left ventricular ejection fraction, by visual estimation, is 45 to 50%. The left ventricle has normal function. The left ventricle demonstrates global hypokinesis. There is no left ventricular hypertrophy. Left ventricular diastolic  parameters are consistent with Grade I diastolic dysfunction (impaired relaxation).  Right Ventricle: The right ventricular size is normal. No increase in right ventricular wall thickness. Global RV systolic function is has normal systolic function.  Left Atrium: Left atrial size was normal in size.  Right Atrium: Right atrial size was normal in size  Pericardium: Trivial pericardial effusion is present.  Mitral Valve: The mitral valve is normal in structure. No evidence of mitral valve stenosis by observation. Mild mitral valve regurgitation.  Tricuspid Valve: The tricuspid valve is normal in structure. Tricuspid valve regurgitation is trivial.  Aortic Valve: The aortic valve is tricuspid. Aortic valve regurgitation is not visualized. Mild aortic valve sclerosis is present, with no evidence of aortic valve stenosis.  Pulmonic Valve: The pulmonic valve was normal in structure. Pulmonic valve regurgitation is trivial.  Aorta: The aortic root is normal in size and structure.  Venous: The inferior vena cava is dilated in size with greater than 50% respiratory variability, suggesting right atrial pressure of 8 mmHg.  IAS/Shunts: No atrial level shunt detected by color flow Doppler.  Antimicrobials:  Vancomycin  Cefepime  Ceftriaxone    Subjective: Continues to feel good. Cough   Objective: Vitals:   07/20/19 0500 07/20/19 0528 07/20/19 0740 07/20/19 1335  BP:  (!) 144/93  137/85  Pulse:  (!) 105  89  Resp:    18  Temp:  99.4 F (37.4 C)  98.9 F (37.2 C)  TempSrc:  Oral  Oral  SpO2:  96% 93% 97%  Weight: 66.1 kg 66.1 kg    Height:        Intake/Output Summary (Last 24 hours) at 07/20/2019 1354 Last data filed at 07/20/2019 1226 Gross per 24 hour  Intake 945.23 ml  Output 1000 ml  Net -54.77 ml   Filed Weights   07/18/19 2158 07/20/19 0500 07/20/19 0528  Weight: 66.8 kg 66.1 kg 66.1 kg    Examination:  General exam: Appears calm and comfortable Respiratory system: Rales at bases but mild. Respiratory effort normal. Cardiovascular system: S1 & S2 heard, RRR. No murmurs, rubs, gallops or clicks. Gastrointestinal system: Abdomen is nondistended, soft and nontender. No organomegaly or masses felt. Normal bowel sounds heard. Central nervous system: Alert. No focal neurological deficits. Extremities: No edema. No calf tenderness Skin: No cyanosis. No rashes Psychiatry: Judgement and insight appear normal. Mood & affect appropriate.     Data Reviewed: I have personally reviewed following labs and imaging studies  CBC: Recent Labs  Lab 07/14/19 0240  07/16/19 1030 07/17/19 0747 07/18/19 0306 07/19/19 0243 07/20/19 0300  WBC 22.0*   < > 15.4* 12.0* 12.6* 11.1* 11.6*  NEUTROABS 18.3*  --   --   --   --   --   --   HGB 8.8*   < > 8.5* 8.6* 8.2* 8.4* 8.8*  HCT 28.4*   < > 27.2* 27.7* 26.4* 26.5* 28.5*  MCV 77.8*   < > 78.8* 78.2* 78.8* 78.2* 78.3*  PLT 593*   < > 819* 830* 800* 793* 848*   < > = values in this interval not displayed.  Basic Metabolic Panel: Recent Labs  Lab 07/14/19 0240  07/16/19 1030 07/17/19 0747 07/17/19 1153 07/18/19 0306 07/19/19 0243 07/20/19 0300  NA 145   < > 138 140  --  136 134* 133*  K 3.2*   < > 2.7* 2.9* 3.2* 3.3* 3.6 3.3*  CL 114*   < > 102 99  --  97* 94* 95*  CO2 19*   < > 24 29  --  28 28 26    GLUCOSE 86   < > 137* 102*  --  103* 91 96  BUN 12   < > <5* <5*  --  <5* <5* <5*  CREATININE 0.62   < > 0.54 0.47  --  0.50 0.55 0.50  CALCIUM 8.4*   < > 7.8* 7.5*  --  7.6* 8.1* 8.3*  MG 1.7  --   --  1.3*  --   --   --   --    < > = values in this interval not displayed.   GFR: Estimated Creatinine Clearance: 70.9 mL/min (by C-G formula based on SCr of 0.5 mg/dL). Liver Function Tests: Recent Labs  Lab 07/14/19 0240  AST 33  ALT 18  ALKPHOS 79  BILITOT 1.4*  PROT 7.1  ALBUMIN 2.0*   No results for input(s): LIPASE, AMYLASE in the last 168 hours. Recent Labs  Lab 07/17/19 1153  AMMONIA 62*   Coagulation Profile: No results for input(s): INR, PROTIME in the last 168 hours. Cardiac Enzymes: No results for input(s): CKTOTAL, CKMB, CKMBINDEX, TROPONINI in the last 168 hours. BNP (last 3 results) No results for input(s): PROBNP in the last 8760 hours. HbA1C: No results for input(s): HGBA1C in the last 72 hours. CBG: No results for input(s): GLUCAP in the last 168 hours. Lipid Profile: No results for input(s): CHOL, HDL, LDLCALC, TRIG, CHOLHDL, LDLDIRECT in the last 72 hours. Thyroid Function Tests: No results for input(s): TSH, T4TOTAL, FREET4, T3FREE, THYROIDAB in the last 72 hours. Anemia Panel: No results for input(s): VITAMINB12, FOLATE, FERRITIN, TIBC, IRON, RETICCTPCT in the last 72 hours. Sepsis Labs: Recent Labs  Lab 07/14/19 0240 07/15/19 0324  PROCALCITON 3.53 1.68    Recent Results (from the past 240 hour(s))  SARS CORONAVIRUS 2 (TAT 6-24 HRS) Nasopharyngeal Nasopharyngeal Swab     Status: None   Collection Time: 07/12/19  1:25 PM   Specimen: Nasopharyngeal Swab  Result Value Ref Range Status   SARS Coronavirus 2 NEGATIVE NEGATIVE Final    Comment: (NOTE) SARS-CoV-2 target nucleic acids are NOT DETECTED. The SARS-CoV-2 RNA is generally detectable in upper and lower respiratory specimens during the acute phase of infection. Negative results do not  preclude SARS-CoV-2 infection, do not rule out co-infections with other pathogens, and should not be used as the sole basis for treatment or other patient management decisions. Negative results must be combined with clinical observations, patient history, and epidemiological information. The expected result is Negative. Fact Sheet for Patients: SugarRoll.be Fact Sheet for Healthcare Providers: https://www.woods-mathews.com/ This test is not yet approved or cleared by the [place] FDA and  has been authorized for detection and/or diagnosis of SARS-CoV-2 by FDA under an Emergency Use Authorization (EUA). This EUA will remain  in effect (meaning this test can be used) for the duration of the COVID-19 declaration under Section 56 4(b)(1) of the Act, 21 U.S.C. section 360bbb-3(b)(1), unless the authorization is terminated or revoked sooner. Performed at [facility], [place] [address] [place], [place] [address]  Blood culture (routine x 2)     Status: Abnormal   Collection Time: 07/12/19  1:30 PM   Specimen: BLOOD  Result Value Ref Range Status   Specimen Description BLOOD LEFT ANTECUBITAL  Final   Special Requests   Final    BOTTLES DRAWN AEROBIC AND ANAEROBIC Blood Culture adequate volume   Culture  Setup Time   Final    GRAM POSITIVE COCCI IN CHAINS AEROBIC BOTTLE ONLY CRITICAL RESULT CALLED TO, READ BACK BY AND VERIFIED WITH: PHARMD J FRAENS [id number] AT 65 AM    Culture (A)  Final    STREPTOCOCCUS PNEUMONIAE SUSCEPTIBILITIES PERFORMED ON PREVIOUS CULTURE WITHIN THE LAST 5 DAYS. Performed at [facility], [place] [address] [place], [place] [address]    Report Status 07/15/2019 FINAL  Final  Blood culture (routine x 2)     Status: Abnormal   Collection Time: 07/12/19  1:30 PM   Specimen: BLOOD  Result Value Ref Range Status   Specimen Description BLOOD RIGHT ANTECUBITAL  Final   Special Requests   Final    BOTTLES DRAWN  AEROBIC AND ANAEROBIC Blood Culture adequate volume   Culture  Setup Time   Final    GRAM POSITIVE COCCI IN CHAINS IN BOTH AEROBIC AND ANAEROBIC BOTTLES CRITICAL RESULT CALLED TO, READ BACK BY AND VERIFIED WITHDaun Peacock, PHARMD, AT 1048 07/13/19 BY D. VANHOOK Performed at [facility], [place] [address] [place], [place] [address]    Culture STREPTOCOCCUS PNEUMONIAE (A)  Final   Report Status 07/15/2019 FINAL  Final   Organism ID, Bacteria STREPTOCOCCUS PNEUMONIAE  Final      Susceptibility   Streptococcus pneumoniae - MIC*    ERYTHROMYCIN <=0.12 SENSITIVE Sensitive     LEVOFLOXACIN 1 SENSITIVE Sensitive     VANCOMYCIN 0.25 SENSITIVE Sensitive     PENICILLIN (non-meningitis) <=0.06 SENSITIVE Sensitive     CEFTRIAXONE (non-meningitis) <=0.12 SENSITIVE Sensitive     * STREPTOCOCCUS PNEUMONIAE  Blood Culture ID Panel (Reflexed)     Status: Abnormal   Collection Time: 07/12/19  1:30 PM  Result Value Ref Range Status   Enterococcus species NOT DETECTED NOT DETECTED Final   Listeria monocytogenes NOT DETECTED NOT DETECTED Final   Staphylococcus species NOT DETECTED NOT DETECTED Final   Staphylococcus aureus (BCID) NOT DETECTED NOT DETECTED Final   Streptococcus species DETECTED (A) NOT DETECTED Final    Comment: CRITICAL RESULT CALLED TO, READ BACK BY AND VERIFIED WITH: PHARMD J FRAENS [id number] AT 847 AM BY CM    Streptococcus agalactiae NOT DETECTED NOT DETECTED Final   Streptococcus pneumoniae DETECTED (A) NOT DETECTED Final    Comment: CRITICAL RESULT CALLED TO, READ BACK BY AND VERIFIED WITH: PHARMD J FRAENS [id number] AT 847 AM BY CM    Streptococcus pyogenes NOT DETECTED NOT DETECTED Final   Acinetobacter baumannii NOT DETECTED NOT DETECTED Final   Enterobacteriaceae species NOT DETECTED NOT DETECTED Final   Enterobacter cloacae complex NOT DETECTED NOT DETECTED Final   Escherichia coli NOT DETECTED NOT DETECTED Final   Klebsiella oxytoca NOT DETECTED NOT DETECTED Final    Klebsiella pneumoniae NOT DETECTED NOT DETECTED Final   Proteus species NOT DETECTED NOT DETECTED Final   Serratia marcescens NOT DETECTED NOT DETECTED Final   Haemophilus influenzae NOT DETECTED NOT DETECTED Final   Neisseria meningitidis NOT DETECTED NOT DETECTED Final   Pseudomonas aeruginosa NOT DETECTED NOT DETECTED Final   Candida albicans NOT DETECTED NOT DETECTED Final   Candida glabrata NOT  DETECTED NOT DETECTED Final   Candida krusei NOT DETECTED NOT DETECTED Final   Candida parapsilosis NOT DETECTED NOT DETECTED Final   Candida tropicalis NOT DETECTED NOT DETECTED Final    Comment: Performed at [facility], [place] [address], [place], [place] [address]  MRSA PCR Screening     Status: None   Collection Time: 07/12/19 11:48 PM   Specimen: Nasal Mucosa; Nasopharyngeal  Result Value Ref Range Status   MRSA by PCR NEGATIVE NEGATIVE Final    Comment:        The GeneXpert MRSA Assay (FDA approved for NASAL specimens only), is one component of a comprehensive MRSA colonization surveillance program. It is not intended to diagnose MRSA infection nor to guide or monitor treatment for MRSA infections. Performed at [facility], [place] [address] [place], [place] [address]   Culture, blood (routine x 2)     Status: None   Collection Time: 07/15/19  1:29 PM   Specimen: BLOOD RIGHT HAND  Result Value Ref Range Status   Specimen Description BLOOD RIGHT HAND  Final   Special Requests   Final    BOTTLES DRAWN AEROBIC ONLY Blood Culture adequate volume   Culture   Final    NO GROWTH 5 DAYS Performed at [facility], [place] [address], [place], [place] [address]    Report Status 07/20/2019 FINAL  Final  Culture, blood (routine x 2)     Status: None   Collection Time: 07/15/19  1:29 PM   Specimen: BLOOD RIGHT HAND  Result Value Ref Range Status   Specimen Description BLOOD RIGHT HAND  Final   Special Requests   Final    BOTTLES DRAWN AEROBIC ONLY Blood Culture adequate  volume   Culture   Final    NO GROWTH 5 DAYS Performed at [facility], [place] [address], [place], [place] [address]    Report Status 07/20/2019 FINAL  Final         Radiology Studies: Dg Chest 2 View  Result Date: 07/20/2019 CLINICAL DATA:  Multifocal pneumonia EXAM: CHEST - 2 VIEW COMPARISON:  07/16/2019 FINDINGS: Decreased right pleural effusion and adjacent atelectasis/consolidation. Decreased left pleural effusion and improved aeration at the left lung base. Stable to decreased patchy perihilar opacity. No pneumothorax. Stable cardiomediastinal contours. IMPRESSION: Decreased pleural effusions with improved aeration at the lung bases. Electronically Signed   By: Macy Mis M.D.   On: 07/20/2019 13:06   US Abdomen Limited Ruq  Result Date: 07/19/2019 CLINICAL DATA:  63 year old female with hepatic encephalopathy. EXAM: ULTRASOUND ABDOMEN LIMITED RIGHT UPPER QUADRANT COMPARISON:  CT of the abdomen pelvis dated 07/12/2019. FINDINGS: Gallbladder: The gallbladder is predominantly contracted. No gallstones or wall thickening visualized. No sonographic Murphy sign noted by sonographer. Common bile duct: Diameter: 6 mm Liver: The liver is unremarkable. Portal vein is patent on color Doppler imaging with normal direction of blood flow towards the liver. Other: Partially visualized right pleural effusion. IMPRESSION: 1. Unremarkable right upper quadrant ultrasound. 2. Right pleural effusion. Electronically Signed   By: Anner Crete M.D.   On: 07/19/2019 16:26        Scheduled Meds: . furosemide  40 mg Intravenous BID   And  . potassium chloride  20 mEq Oral BID  . lactulose  20 g Oral BID  . mouth rinse  15 mL Mouth Rinse BID  . mometasone-formoterol  2 puff Inhalation BID  . montelukast  10 mg Oral QHS  .  pneumococcal 23 valent vaccine  0.5 mL Intramuscular Tomorrow-1000  . thiamine  100 mg Oral Daily   Continuous Infusions: . sodium chloride    . penicillin g  continuous IV infusion 12 Million Units (07/20/19 0050)     LOS: 8 days     Cordelia Poche, MD [facility] 07/20/2019, 1:54 PM  If 7PM-7AM, please contact night-coverage [email or web address]

## 2019-07-20 NOTE — Progress Notes (Signed)
Patient off the floor RN to reapply IV team consult when patient returns

## 2019-07-20 NOTE — Progress Notes (Signed)
Occupational Therapy Treatment Patient Details Name: Angel Hebert MRN: [id number] DOB: 04-17-1956 Today's Date: 07/20/2019    History of present illness Angel Hebert is a 63 y.o. female with history of hypertension, GERD, asthma, osteoarthritis. Patient presented secondary to right flank pain and found to have multifocal pneumonia and now strep pneumoniae bacteremia.   OT comments  Pt sitting in bed upon arrival, completely saturated in urine requiring new linens, full body bath, and bed to be cleaned. Pt reported she had been in current state for about 1 hour, when asked why she didn't call for staff, pt replied "I don't know". Pt required minguard for UB and LB bathing, minA for toilet transfer, and minguard to don socks. Pt with 1x LOB when getting up from [facility], pt reached to stabilize on IV pole, required physical assistance from therapist to correct balance and vc for safe hand placement. Pt educated on safe use of DME. Due to cognitive limitations and physical limitations pt is at an increased risk for falls. Pt will continue to benefit from skilled OT services to maximize safety and independence with ADL/IADL and functional mobility. Will continue to follow acutely and progress as tolerated.    Follow Up Recommendations  SNF    Equipment Recommendations  3 in 1 bedside commode;Other (comment)(RW)    Recommendations for Other Services      Precautions / Restrictions Precautions Precautions: Fall Restrictions Weight Bearing Restrictions: No       Mobility Bed Mobility Overal bed mobility: Modified Independent                Transfers Overall transfer level: Needs assistance Equipment used: Rolling walker (2 wheeled) Transfers: Sit to/from Stand Sit to Stand: Min assist         General transfer comment: minA for safe hand placement, stability and safe use of RW    Balance Overall balance assessment: Needs assistance Sitting-balance support: Feet  supported;No upper extremity supported Sitting balance-Leahy Scale: Good     Standing balance support: No upper extremity supported;During functional activity Standing balance-Leahy Scale: Poor Standing balance comment: pt with 1x LOB when standing from [facility], pt reached for IV pole to stabilize, required physical assistance from therapist to regain balance                           ADL either performed or assessed with clinical judgement   ADL Overall ADL's : Needs assistance/impaired         Upper Body Bathing: Min guard;Sitting   Lower Body Bathing: Min guard;Sitting/lateral leans Lower Body Bathing Details (indicate cue type and reason): able to figure 4 Upper Body Dressing : Min guard Upper Body Dressing Details (indicate cue type and reason): therapist did snaps on gown Lower Body Dressing: Min guard;Sitting/lateral leans Lower Body Dressing Details (indicate cue type and reason): pt donned socks sitting with figure-4  Toilet Transfer: Minimal assistance;RW Toilet Transfer Details (indicate cue type and reason): simulated to BSC;pt had BM on BSC Toileting- Clothing Manipulation and Hygiene: Minimal assistance;Sit to/from stand Toileting - Clothing Manipulation Details (indicate cue type and reason): pt performed posterior pericare with minA for stability     Functional mobility during ADLs: Minimal assistance;Rolling walker General ADL Comments: pt required minA for safety throughout ADL;pt limited by cognition, unsure of baseline     Vision       Perception     Praxis      Cognition Arousal/Alertness: Awake/alert Behavior During Therapy:  WFL for tasks assessed/performed Overall Cognitive Status: Impaired/Different from baseline Area of Impairment: Following commands;Safety/judgement;Awareness;Problem solving                       Following Commands: Follows one step commands with increased time Safety/Judgement: Decreased awareness of  safety;Decreased awareness of deficits Awareness: Emergent Problem Solving: Slow processing;Decreased initiation;Difficulty sequencing General Comments: pt saturated upon arrival, reports she has been in this condition for about an hour, pt reports she is "unsure" why she didn't call for assistance;pt required vc for problem solving through full body bathing and safe vc for transfers with use of RW        Exercises     Shoulder Instructions       General Comments      Pertinent Vitals/ Pain       Pain Assessment: No/denies pain  Home Living                                          Prior Functioning/Environment              Frequency  Min 2X/week        Progress Toward Goals  OT Goals(current goals can now be found in the care plan section)  Progress towards OT goals: Progressing toward goals  Acute Rehab OT Goals Patient Stated Goal: return home OT Goal Formulation: With patient Time For Goal Achievement: 07/31/19 Potential to Achieve Goals: Good ADL Goals Pt Will Perform Grooming: with set-up;sitting Pt Will Perform Upper Body Bathing: with set-up;sitting Pt Will Perform Lower Body Bathing: with set-up;sit to/from stand Pt Will Transfer to Toilet: with set-up;bedside commode;ambulating Additional ADL Goal #1: pt will complete 2step command for adl  Plan Discharge plan remains appropriate    Co-evaluation                 AM-PAC OT "6 Clicks" Daily Activity     Outcome Measure   Help from another person eating meals?: A Little Help from another person taking care of personal grooming?: A Little Help from another person toileting, which includes using toliet, bedpan, or urinal?: A Little Help from another person bathing (including washing, rinsing, drying)?: A Little Help from another person to put on and taking off regular upper body clothing?: A Little Help from another person to put on and taking off regular lower body  clothing?: A Little 6 Click Score: 18    End of Session Equipment Utilized During Treatment: Gait belt;Rolling walker  OT Visit Diagnosis: Unsteadiness on feet (R26.81);Muscle weakness (generalized) (M62.81)   Activity Tolerance Patient tolerated treatment well   Patient Left in bed;with call bell/phone within reach(with staff to take pt to X-ray)   Nurse Communication Mobility status        Time: [id number] OT Time Calculation (min): 29 min  Charges: OT General Charges $OT Visit: 1 Visit OT Treatments $Self Care/Home Management : 23-37 mins  [place] Office: [place] 07/20/2019, 1:19 PM

## 2019-07-21 DIAGNOSIS — J181 Lobar pneumonia, unspecified organism: Secondary | ICD-10-CM

## 2019-07-21 DIAGNOSIS — B955 Unspecified streptococcus as the cause of diseases classified elsewhere: Secondary | ICD-10-CM

## 2019-07-21 DIAGNOSIS — I5021 Acute systolic (congestive) heart failure: Secondary | ICD-10-CM

## 2019-07-21 DIAGNOSIS — A409 Streptococcal sepsis, unspecified: Secondary | ICD-10-CM

## 2019-07-21 LAB — BASIC METABOLIC PANEL
Anion gap: 11 (ref 5–15)
BUN: <5 mg/dL — ABNORMAL LOW (ref 8–23)
CO2: 25 mmol/L (ref 22–32)
Calcium: 8.6 mg/dL — ABNORMAL LOW (ref 8.9–10.3)
Chloride: 96 mmol/L — ABNORMAL LOW (ref 98–111)
Creatinine, Ser: 0.47 mg/dL (ref 0.44–1.00)
GFR calc Af Amer: >60 mL/min (ref >60)
GFR calc non Af Amer: >60 mL/min (ref >60)
Glucose, Bld: 97 mg/dL (ref 70–99)
Potassium: 3.6 mmol/L (ref 3.5–5.1)
Sodium: 132 mmol/L — ABNORMAL LOW (ref 135–145)

## 2019-07-21 LAB — CBC
HCT: 31.1 % — ABNORMAL LOW (ref 36.0–46.0)
Hemoglobin: 9.5 g/dL — ABNORMAL LOW (ref 12.0–15.0)
MCH: 24.2 pg — ABNORMAL LOW (ref 26.0–34.0)
MCHC: 30.5 g/dL (ref 30.0–36.0)
MCV: 79.3 fL — ABNORMAL LOW (ref 80.0–100.0)
Platelets: 865 10*3/uL — ABNORMAL HIGH (ref 150–400)
RBC: 3.92 MIL/uL (ref 3.87–5.11)
RDW: 26.5 % — ABNORMAL HIGH (ref 11.5–15.5)
WBC: 9.6 10*3/uL (ref 4.0–10.5)
nRBC: 0 % (ref 0.0–0.2)

## 2019-07-21 MED ORDER — FUROSEMIDE 40 MG PO TABS: 40.0000 mg | ORAL_TABLET | Freq: Every day | ORAL | 0 refills | Status: DC

## 2019-07-21 MED ORDER — AMOXICILLIN 500 MG PO TABS: 500.0000 mg | ORAL_TABLET | Freq: Three times a day (TID) | ORAL | 0 refills | Status: AC

## 2019-07-21 MED ORDER — BENZONATATE 200 MG PO CAPS: 200.0000 mg | ORAL_CAPSULE | Freq: Three times a day (TID) | ORAL | 0 refills | Status: DC | PRN

## 2019-07-21 MED ORDER — ONDANSETRON HCL 4 MG PO TABS: 4.0000 mg | ORAL_TABLET | Freq: Four times a day (QID) | ORAL | 0 refills | Status: DC | PRN

## 2019-07-21 MED ORDER — POTASSIUM CHLORIDE CRYS ER 20 MEQ PO TBCR: 20.0000 meq | EXTENDED_RELEASE_TABLET | Freq: Every day | ORAL | 0 refills | Status: DC

## 2019-07-21 MED ORDER — TRAZODONE HCL 50 MG PO TABS: 50.0000 mg | ORAL_TABLET | Freq: Every evening | ORAL | Status: DC | PRN

## 2019-07-21 MED ORDER — THIAMINE HCL 100 MG PO TABS: 100.0000 mg | ORAL_TABLET | Freq: Every day | ORAL | 0 refills | Status: DC

## 2019-07-21 MED ORDER — LACTULOSE 10 GM/15ML PO SOLN: 10.0000 g | Freq: Two times a day (BID) | ORAL | 0 refills | Status: DC

## 2019-07-21 NOTE — Plan of Care (Signed)

## 2019-07-21 NOTE — Progress Notes (Signed)
Pt given discharge instructions, prescriptions, and care notes. Pt verbalized understanding AEB no further questions or concerns at this time. IV was discontinued, no redness, pain, or swelling noted at this time. Telemetry discontinued and Centralized Telemetry was notified. Pt left the floor via wheelchair with staff in stable condition. 

## 2019-07-21 NOTE — Progress Notes (Signed)
[facility]    Date of Admission:  07/12/2019   Total days of antibiotics 9           ID: Angel Hebert is a 63 y.o. female with  Strep pneumonia with secondary bacteremia Principal Problem:   Symptomatic anemia Active Problems:   Hyperlipidemia   Essential hypertension   GERD   Sepsis ([place])   Hypokalemia   Lobar pneumonia ([facility])    Subjective: Afebrile, just having dry cough. Doing well. Ready to go home  Medications:  . furosemide  40 mg Intravenous BID   And  . potassium chloride  20 mEq Oral BID  . lactulose  20 g Oral BID  . mouth rinse  15 mL Mouth Rinse BID  . mometasone-formoterol  2 puff Inhalation BID  . montelukast  10 mg Oral QHS  . thiamine  100 mg Oral Daily    Objective: Vital signs in last 24 hours: Temp:  [98.9 F (37.2 C)-99.6 F (37.6 C)] 98.9 F (37.2 C) (11/18 0544) Pulse Rate:  [89-113] 105 (11/18 0544) Resp:  [16-18] 16 (11/17 2055) BP: (125-156)/(85-94) 156/94 (11/18 0544) SpO2:  [94 %-97 %] 96 % (11/18 0544) Weight:  [65.2 kg] 65.2 kg (11/18 0545) Physical Exam  Constitutional:  oriented to person, place, and time. appears well-developed and well-nourished. No distress.  HENT: [place]/AT, PERRLA, no scleral icterus Mouth/Throat: Oropharynx is clear and moist. No oropharyngeal exudate.  Cardiovascular: Normal rate, regular rhythm and normal heart sounds. Exam reveals no gallop and no friction rub.  No murmur heard.  Pulmonary/Chest: Effort normal and breath sounds normal. No respiratory distress.  has no wheezes.  Neck = supple, no nuchal rigidity Abdominal: Soft. Bowel sounds are normal.  exhibits no distension. There is no tenderness.  Lymphadenopathy: no cervical adenopathy. No axillary adenopathy Neurological: alert and oriented to person, place, and time.  Skin: Skin is warm and dry. No rash noted. No erythema.  Psychiatric: a normal mood and affect.  behavior is normal.    Lab Results Recent Labs     07/20/19 0300 07/21/19 0252  WBC 11.6* 9.6  HGB 8.8* 9.5*  HCT 28.5* 31.1*  NA 133* 132*  K 3.3* 3.6  CL 95* 96*  CO2 26 25  BUN <5* <5*  CREATININE 0.50 0.47    Microbiology: 11/17 blood cx ngtd 11/12 blood cx ngtd 11/9 blood cx strep pneumo Studies/Results: Dg Chest 2 View  Result Date: 07/20/2019 CLINICAL DATA:  Multifocal pneumonia EXAM: CHEST - 2 VIEW COMPARISON:  07/16/2019 FINDINGS: Decreased right pleural effusion and adjacent atelectasis/consolidation. Decreased left pleural effusion and improved aeration at the left lung base. Stable to decreased patchy perihilar opacity. No pneumothorax. Stable cardiomediastinal contours. IMPRESSION: Decreased pleural effusions with improved aeration at the lung bases. Electronically Signed   By: Macy Mis M.D.   On: 07/20/2019 13:06   Ir US Chest  Result Date: 07/20/2019 CLINICAL DATA:  Right pleural effusion. EXAM: CHEST ULTRASOUND COMPARISON:  Chest x-ray earlier today. FINDINGS: By ultrasound, there is only a small amount of right pleural fluid. There was not enough fluid present to allow for thoracentesis today. IMPRESSION: Small right pleural effusion. Thoracentesis was not able to be safely performed today. Electronically Signed   By: Aletta Edouard M.D.   On: 07/20/2019 18:13   US Abdomen Limited Ruq  Result Date: 07/19/2019 CLINICAL DATA:  63 year old female with hepatic encephalopathy. EXAM: ULTRASOUND ABDOMEN LIMITED RIGHT UPPER QUADRANT COMPARISON:  CT of the abdomen  pelvis dated 07/12/2019. FINDINGS: Gallbladder: The gallbladder is predominantly contracted. No gallstones or wall thickening visualized. No sonographic Murphy sign noted by sonographer. Common bile duct: Diameter: 6 mm Liver: The liver is unremarkable. Portal vein is patent on color Doppler imaging with normal direction of blood flow towards the liver. Other: Partially visualized right pleural effusion. IMPRESSION: 1. Unremarkable right upper quadrant  ultrasound. 2. Right pleural effusion. Electronically Signed   By: Anner Crete M.D.   On: 07/19/2019 16:26     Assessment/Plan: Complicated strep pneumonia with secondary bacteremia = patient had low grade fever recently but isolated, appears improved. No ongoing bacteremia. Agree with plan to finish off treatment with amoxicillin, has finished 9 d of iv abtx thus far, ready for discharge.  [facility] Cell: [phone] Pager: [phone]  07/21/2019, 1:00 PM

## 2019-07-21 NOTE — TOC Transition Note (Signed)
Transition of Care [facility]) - CM/SW Discharge Note   Patient Details  Name: Angel Hebert MRN: [id number] Date of Birth: 21-Jan-1956  [facility] CM/SW Contact:  Benard Halsted, [place] Phone Number: 07/21/2019, 11:04 AM   Clinical Narrative:    CSW contacted the following for home health: [place], [place], [place], [place], [place], [place]. None are able to accept Medicaid at this time. Patient in agreement to complete outpatient PT. CSW will send referral. No other needs identified at this time.      Barriers to Discharge: Continued Medical Work up   Patient Goals and CMS Choice Patient states their goals for this hospitalization and ongoing recovery are:: Pt agreeable to outpatient rehab CMS Medicare.gov Compare Post Acute Care list provided to:: Patient Choice offered to / list presented to : Patient  Discharge Placement                       Discharge Plan and Services In-house Referral: Clinical Social Work Discharge Planning Services: NA Post Acute Care Choice: (Outpatient Rehab)          DME Arranged: N/A DME Agency: NA                  Social Determinants of Health (SDOH) Interventions     Readmission Risk Interventions No flowsheet data found.

## 2019-07-21 NOTE — Discharge Summary (Signed)
Physician Discharge Summary  Angel Hebert [id number] DOB: 03/28/1956 DOA: 07/12/2019  PCP: Charlott Rakes, MD  Admit date: 07/12/2019 Discharge date: 07/21/2019  Admitted From: Home Disposition: Home  Recommendations for Outpatient Follow-up:  1. Follow up with PCP in 1 week with repeat CBC/BMP 2. Outpatient evaluation and follow-up by cardiology 3. Outpatient evaluation and follow-up by GI. 4. Follow up in ED if symptoms worsen or new appear   Home Health: PT/OT.  Patient refused SNF placement Equipment/Devices: None  Discharge Condition: Stable CODE STATUS: Full Diet recommendation: Heart healthy  Brief/Interim Summary: 63 year old female with history of hypertension, gout, asthma, osteoarthritis presented with right flank pain and was found to have multifocal pneumonia.  She was found to have strep bacteremia and pneumonia along with anemia with hemoglobin of 5.2.  She was transfused 2 units packed red cells and subsequently hemoglobin improved.  ID was consulted and patient was treated with intravenous penicillin.  She was also found to have EF of 45 to 50% and was treated with intravenous Lasix.  Her overall condition has improved.  PT recommended SNF placement.  She refused.  She will be discharged home with home health on 5 more days of oral amoxicillin as per ID recommendations.  Discharge Diagnoses:  Strep pneumoniae bacteremia and multifocal pneumonia: Present on admission Sepsis: Present on admission.  Resolved -Patient was initially started on broad-spectrum antibiotics and subsequently switched to Rocephin.  ID was consulted and antibiotics was switched to penicillin G IV, patient is currently on penicillin G. -Repeat blood cultures from 07/15/2019 have been negative so far -TTE did not show any vegetations -Currently afebrile and hemodynamically stable and on room air.  Will discharge on amoxicillin for 5 more days as per ID recommendations  Symptomatic  anemia -Unknown baseline.  Presented with hemoglobin of 5.2 with no overt GI bleed.  Status post 2 units packed red cells transfusion during this admission.  Hemoglobin has stayed stable subsequently.  Hemoglobin 9.5 today.  Might need outpatient GI evaluation  Acute systolic heart failure -EF of 45 to 50% on recent TTE.  Currently on intravenous Lasix.  Will discharge on Lasix 40 mg daily.  Outpatient follow-up with cardiology.  Acute metabolic encephalopathy causing confusion -CT head was unremarkable for acute process but significant for small vessel white matter disease and extensive periventricular/deep white matter hypodensity. -Unclear etiology.  Unclear if this is secondary to alcohol withdrawal.  Continue thiamine. -Ammonia level elevated at 63 and right upper quadrant ultrasound was unremarkable for liver pathology.  Continue lactulose -Mental status waxing and waning but currently stable.  Might have underlying cognitive disease.  Might need outpatient neurology evaluation.  Leukocytosis -Resolved  Thrombocytosis -Probably reactive.  Outpatient follow-up  Hypokalemia -Improved  Hypoalbuminemia -Follow nutrition recommendations  Hypertension -Resume Hyzaar at discharge   L3 compression fracture -Subacute.  Outpatient follow-up  GERD -Continue PPI  Hyperlipidemia -Continue Lipitor  Discharge Instructions  Discharge Instructions    Ambulatory referral to Cardiology   Complete by: As directed    Systolic heart failure   Diet - low sodium heart healthy   Complete by: As directed    Increase activity slowly   Complete by: As directed      Allergies as of 07/21/2019   No Known Allergies     Medication List    STOP taking these medications   gabapentin 300 MG capsule Commonly known as: NEURONTIN     TAKE these medications   albuterol (2.5 MG/3ML) 0.083% nebulizer solution Commonly known as:  PROVENTIL Take 3 mLs (2.5 mg total) by nebulization every 6  (six) hours as needed for wheezing or shortness of breath.   ProAir HFA 108 (90 Base) MCG/ACT inhaler Generic drug: albuterol Inhale 2 puffs into the lungs every 6 (six) hours as needed for wheezing or shortness of breath.   All Day Allergy 10 MG tablet Generic drug: cetirizine TAKE 1 TABLET BY MOUTH DAILY.   amoxicillin 500 MG tablet Commonly known as: AMOXIL Take 1 tablet (500 mg total) by mouth 3 (three) times daily for 5 days.   atorvastatin 10 MG tablet Commonly known as: LIPITOR Take 1 tablet (10 mg total) by mouth daily at 6 PM.   benzonatate 200 MG capsule Commonly known as: TESSALON Take 1 capsule (200 mg total) by mouth 3 (three) times daily as needed for cough.   fluticasone 50 MCG/ACT nasal spray Commonly known as: FLONASE Place 2 sprays into both nostrils daily.   Fluticasone-Salmeterol 500-50 MCG/DOSE Aepb Commonly known as: Advair Diskus INHALE 1 PUFF INTO THE LUNGS EVERY 12 (TWELVE) HOURS.   furosemide 40 MG tablet Commonly known as: Lasix Take 1 tablet (40 mg total) by mouth daily.   lactulose 10 GM/15ML solution Commonly known as: CHRONULAC Take 15 mLs (10 g total) by mouth 2 (two) times daily.   losartan-hydrochlorothiazide 100-25 MG tablet Commonly known as: HYZAAR Take 1 tablet by mouth daily.   montelukast 10 MG tablet Commonly known as: SINGULAIR Take 1 tablet (10 mg total) by mouth at bedtime.   naproxen 500 MG tablet Commonly known as: NAPROSYN TAKE 1 TABLET BY MOUTH 2 TIMES DAILY   ondansetron 4 MG tablet Commonly known as: ZOFRAN Take 1 tablet (4 mg total) by mouth every 6 (six) hours as needed for nausea.   pantoprazole 20 MG tablet Commonly known as: PROTONIX Take 1 tablet (20 mg total) by mouth daily.   potassium chloride SA 20 MEQ tablet Commonly known as: KLOR-CON Take 1 tablet (20 mEq total) by mouth daily.   thiamine 100 MG tablet Take 1 tablet (100 mg total) by mouth daily.   traZODone 50 MG tablet Commonly known as:  DESYREL Take 1 tablet (50 mg total) by mouth at bedtime as needed for sleep. What changed:   when to take this  reasons to take this      Follow-up Information    Charlott Rakes, MD Follow up.   Specialty: Family Medicine Why: with repeat CBC/BMP Contact information: [place] [address] [phone]          No Known Allergies  Consultations:  ID   Procedures/Studies: Dg Chest 2 View  Result Date: 07/20/2019 CLINICAL DATA:  Multifocal pneumonia EXAM: CHEST - 2 VIEW COMPARISON:  07/16/2019 FINDINGS: Decreased right pleural effusion and adjacent atelectasis/consolidation. Decreased left pleural effusion and improved aeration at the left lung base. Stable to decreased patchy perihilar opacity. No pneumothorax. Stable cardiomediastinal contours. IMPRESSION: Decreased pleural effusions with improved aeration at the lung bases. Electronically Signed   By: Macy Mis M.D.   On: 07/20/2019 13:06   Dg Chest 2 View  Result Date: 07/16/2019 CLINICAL DATA:  Chest pain in a patient with pneumonia. EXAM: CHEST - 2 VIEW COMPARISON:  CT chest 07/13/2019 single-view of the chest 07/12/2019. FINDINGS: Right greater than left pleural effusions and airspace disease have worsened. There is cardiomegaly. No pneumothorax. No acute or focal bony abnormality. IMPRESSION: Worsened right greater than left pleural effusions and airspace disease. Cardiomegaly. Electronically Signed   By:  Inge Rise M.D.   On: 07/16/2019 10:05   Ct Head Wo Contrast  Result Date: 07/17/2019 CLINICAL DATA:  Altered mental status EXAM: CT HEAD WITHOUT CONTRAST TECHNIQUE: Contiguous axial images were obtained from the base of the skull through the vertex without intravenous contrast. COMPARISON:  None. FINDINGS: Brain: No evidence of acute infarction, hemorrhage, hydrocephalus, extra-axial collection or mass lesion/mass effect. Extensive periventricular and deep white matter hypodensity.  Vascular: No hyperdense vessel or unexpected calcification. Skull: Normal. Negative for fracture or focal lesion. Sinuses/Orbits: Extensive paranasal sinus mucosal thickening and opacification, status post bilateral maxillary antrostomy. Other: None. IMPRESSION: No acute intracranial pathology.  Small-vessel white matter disease. Electronically Signed   By: Eddie Candle M.D.   On: 07/17/2019 14:46   Ct Angio Chest Pe W Or Wo Contrast  Result Date: 07/13/2019 CLINICAL DATA:  63 year old female with shortness of breath. EXAM: CT ANGIOGRAPHY CHEST WITH CONTRAST TECHNIQUE: Multidetector CT imaging of the chest was performed using the standard protocol during bolus administration of intravenous contrast. Multiplanar CT image reconstructions and MIPs were obtained to evaluate the vascular anatomy. CONTRAST:  1109mL OMNIPAQUE IOHEXOL 350 MG/ML SOLN COMPARISON:  Chest CT dated 02/05/2017. FINDINGS: Cardiovascular: There is mild cardiomegaly. No pericardial effusion. There is coronary vascular calcification primarily involving the LAD. Minimal atherosclerotic calcification of the aortic arch. No aneurysmal dilatation or dissection. The origins of the great vessels of the aortic arch appear patent as visualized. No large or central pulmonary artery embolus identified. There is intraluminal filling defect involving distal/subsegmental right upper lobe branch (axial series 6 images 136-142 and sagittal series 9, image 52). Additional similar tiny filling defect in a distal/subsegmental left upper lobe pulmonary artery branch (axial series 6 image 139). These may represent areas of scarring or sequela of prior PEs. Acute small subsegmental pulmonary artery emboli are not excluded. Clinical correlation is recommended. Mediastinum/Nodes: No hilar or mediastinal adenopathy. Evaluation however is limited due to consolidative changes of the lower lobes. The esophagus is grossly unremarkable. No mediastinal fluid collection.  Lungs/Pleura: Large areas of consolidative changes involving the lower lobes bilaterally with air bronchograms most consistent with pneumonia. Clinical correlation and follow-up to solution recommended to exclude underlying mass. There is a segmental area of consolidation in the right middle lobe. Small bilateral pleural effusions noted. There is no pneumothorax. The central airways are patent. Upper Abdomen: No acute abnormality. Musculoskeletal: No acute osseous pathology. Degenerative changes of the spine. Review of the MIP images confirms the above findings. IMPRESSION: 1. No CT evidence of large or central pulmonary artery embolus. Small intraluminal filling defects involving the distal/subsegmental right upper lobe and left upper lobe pulmonary artery branches may represent areas of scarring or sequela of prior PEs. Acute small subsegmental pulmonary artery emboli are not excluded. Clinical correlation is recommended. 2. Bilateral lower lobe and right middle lobe consolidative changes most consistent with pneumonia. Clinical correlation and follow-up to solution recommended. 3. Small bilateral pleural effusions. 4. Aortic Atherosclerosis (ICD10-I70.0). Electronically Signed   By: Anner Crete M.D.   On: 07/13/2019 14:30   Ct Abdomen Pelvis W Contrast  Result Date: 07/12/2019 CLINICAL DATA:  63 year old female with history of abdominal pain. Suspected diverticulitis. History of fever. EXAM: CT ABDOMEN AND PELVIS WITH CONTRAST TECHNIQUE: Multidetector CT imaging of the abdomen and pelvis was performed using the standard protocol following bolus administration of intravenous contrast. CONTRAST:  152mL OMNIPAQUE IOHEXOL 300 MG/ML  SOLN COMPARISON:  No priors. FINDINGS: Lower chest: Extensive airspace consolidation in the left lower  lobe, as well as additional areas of airspace consolidation in the right lower lobe, right middle lobe and inferior segment of the lingula. Atherosclerotic calcifications in the  left anterior descending and right coronary arteries. Hepatobiliary: No suspicious cystic or solid hepatic lesions. No intra or extrahepatic biliary ductal dilatation. Gallbladder is normal in appearance. Pancreas: No pancreatic mass. No pancreatic ductal dilatation. No pancreatic or peripancreatic fluid collections or inflammatory changes. Spleen: Unremarkable. Adrenals/Urinary Tract: Bilateral kidneys and bilateral adrenal glands are normal in appearance. No hydroureteronephrosis. Urinary bladder is normal in appearance. Stomach/Bowel: Normal appearance of the stomach. No pathologic dilatation of small bowel or colon. The appendix is not confidently identified and may be surgically absent. Regardless, there are no inflammatory changes noted adjacent to the cecum to suggest the presence of an acute appendicitis at this time. Vascular/Lymphatic: Aortic atherosclerosis, without evidence of aneurysm or dissection in the abdominal or pelvic vasculature. No lymphadenopathy noted in the abdomen or pelvis. Reproductive: Status post hysterectomy. Ovaries are unremarkable in appearance. Other: No significant volume of ascites.  No pneumoperitoneum. Musculoskeletal: Compression fracture of superior endplate of L3 with D34-534 loss of anterior vertebral body height, with clearly visible fracture lines anteriorly, but no surrounding soft tissue swelling indicative of a subacute fracture. There are no aggressive appearing lytic or blastic lesions noted in the visualized portions of the skeleton. IMPRESSION: 1. No acute findings are noted in the abdomen or pelvis to account for the patient's symptoms. Specifically, no evidence of acute diverticulitis. 2. However, there is multifocal airspace consolidation throughout the visualize lung bases, compatible with severe multilobar pneumonia. This likely accounts for the patient's history of fever. 3. Subacute compression fracture of superior endplate of L3 with D34-534 loss of anterior  vertebral body height. 4. Aortic atherosclerosis, in addition to least 2 vessel coronary artery disease. Please note that although the presence of coronary artery calcium documents the presence of coronary artery disease, the severity of this disease and any potential stenosis cannot be assessed on this non-gated CT examination. Assessment for potential risk factor modification, dietary therapy or pharmacologic therapy may be warranted, if clinically indicated. Electronically Signed   By: Vinnie Langton M.D.   On: 07/12/2019 15:44   Dg Chest Port 1 View  Result Date: 07/12/2019 CLINICAL DATA:  Hypoxia EXAM: PORTABLE CHEST 1 VIEW COMPARISON:  07/12/2011 FINDINGS: Cardiac shadow is stable. Aortic calcifications are again seen. Bibasilar infiltrates are noted worsening from the prior exam. They appear increased when compared with the most recent CT of the abdomen and pelvis as well. Skin fold is noted over the upper right chest. No pneumothorax is seen. No definitive bony abnormality is noted. IMPRESSION: Increasing bibasilar infiltrates. Electronically Signed   By: Inez Catalina M.D.   On: 07/12/2019 19:14   Dg Chest Portable 1 View  Result Date: 07/12/2019 CLINICAL DATA:  Right-sided chest pain and shortness of breath for 1 day. EXAM: PORTABLE CHEST 1 VIEW COMPARISON:  Chest x-ray 02/07/2017 FINDINGS: The heart is upper limits of normal in size. There is mild tortuosity and calcification of the thoracic aorta. There are patchy bibasilar infiltrates. No pleural effusions. No pulmonary edema. The bony thorax is intact. IMPRESSION: Bibasilar infiltrates. Electronically Signed   By: Marijo Sanes M.D.   On: 07/12/2019 13:39   Dg Swallowing Func-speech Pathology  Result Date: 07/15/2019 Objective Swallowing Evaluation: Type of Study: MBS-Modified Barium Swallow Study  Patient Details Name: Kaesha Risser MRN: [id number] Date of Birth: [birth year] Today's Date: 07/15/2019 Time: SLP Start Time (ACUTE  ONLY):  1010 -SLP Stop Time (ACUTE ONLY): 1032 SLP Time Calculation (min) (ACUTE ONLY): 22 min Past Medical History: Past Medical History: Diagnosis Date . Arthritis  . Asthma   last year in December . Dyspnea  . GERD (gastroesophageal reflux disease)  . High cholesterol  . Hypertension  . Pneumonia 01/2017 Past Surgical History: Past Surgical History: Procedure Laterality Date . ABDOMINAL HYSTERECTOMY   . nasal polyps    removed just this past tuesday at [place] facility over by [facility] . 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 with hx of hypertension, GERD, asthma, osteoarthritis presented with right flank pain on 07/12/2019.  She was found to be hypotensive with leukocytosis and hemoglobin of 5.2.  CT abdomen and pelvis showed no acute findings but showed multifocal airspace consolidation throughout lung bases compatible with severe multilobar pneumonia along with subacute compression fracture of the superior endplate of the L3. Latest chest CT 11/10 showed Bilateral lower lobe and right middle lobe consolidative changes most consistent with pneumonia, small bilateral pleural effusions, and aortic atherosclerosis. Diet currently reg/thin.  No data recorded Assessment / Plan / Recommendation CHL IP CLINICAL IMPRESSIONS 07/15/2019 Clinical Impression Pt presented alert, cooperative, distractable, and in a pleasant mood. Oral care completed prior to and at conclusion of study to clear dried secretions on lower lip. Pt exhibited mild oropharyngeal dysphagia significant for prolonged mastication/delayed oral transit with regular solids, delay in swallow initiation to valleculae with nectar, and flash penetration due to slightly decreased epiglottic inversion with thins during large, consecutive sips.  No instances of aspiration during study although  congested cough present at baseline, during study, and after 2/2 pneumonia. Pts impulsivity increases risk of aspiration given proclivity to take very large, consecutive sips via cup and straw. Regular solids required 2-3 minutes of mastication for 1x small bite suspect due to respiratory deficits (RR ranging from 32-41 during mastication), absence of dentures, and decreased endurance. Recommend diet adjustment to Dysphagia 2 (minced/chopped) with thin liquids (cup or straw), meds whole in puree. Full supervision to assist pt with self feeding and provide cueing/intervention for pt to take single sips. SLP will follow for diet toleration with goal of upgrading to D3.  SLP Visit Diagnosis Dysphagia, pharyngeal phase (R13.13) Attention and concentration deficit following -- Frontal lobe and executive function deficit following -- Impact on safety and function Mild aspiration risk   CHL IP TREATMENT RECOMMENDATION 07/15/2019 Treatment Recommendations Therapy as outlined in treatment plan below   Prognosis 07/15/2019 Prognosis for Safe Diet Advancement Fair Barriers to Reach Goals Cognitive deficits;Other (Comment) Barriers/Prognosis Comment -- CHL IP DIET RECOMMENDATION 07/15/2019 SLP Diet Recommendations Dysphagia 2 (Fine chop) solids;Thin liquid Liquid Administration via Cup;Straw Medication Administration Whole meds with puree Compensations Small sips/bites;Slow rate Postural Changes Seated upright at 90 degrees   CHL IP OTHER RECOMMENDATIONS 07/15/2019 Recommended Consults -- Oral Care Recommendations Oral care QID;Staff/trained caregiver to provide oral care Other Recommendations --   CHL IP FOLLOW UP RECOMMENDATIONS 07/15/2019 Follow up Recommendations Skilled Nursing facility;24 hour supervision/assistance   CHL IP FREQUENCY AND DURATION 07/15/2019 Speech Therapy Frequency (ACUTE ONLY) min 1 x/week Treatment Duration 1 week      CHL IP ORAL PHASE 07/15/2019 Oral Phase Impaired Oral - Pudding Teaspoon -- Oral -  Pudding Cup -- Oral - Honey Teaspoon -- Oral - Honey Cup -- Oral -  Nectar Teaspoon -- Oral - Nectar Cup WFL Oral - Nectar Straw -- Oral - Thin Teaspoon -- Oral - Thin Cup WFL Oral - Thin Straw WFL Oral - Puree -- Oral - Mech Soft -- Oral - Regular Delayed oral transit;Decreased bolus cohesion Oral - Multi-Consistency -- Oral - Pill -- Oral Phase - Comment --  CHL IP PHARYNGEAL PHASE 07/15/2019 Pharyngeal Phase Impaired Pharyngeal- Pudding Teaspoon -- Pharyngeal -- Pharyngeal- Pudding Cup -- Pharyngeal -- Pharyngeal- Honey Teaspoon -- Pharyngeal -- Pharyngeal- Honey Cup -- Pharyngeal -- Pharyngeal- Nectar Teaspoon -- Pharyngeal -- Pharyngeal- Nectar Cup Delayed swallow initiation-vallecula Pharyngeal -- Pharyngeal- Nectar Straw -- Pharyngeal -- Pharyngeal- Thin Teaspoon -- Pharyngeal -- Pharyngeal- Thin Cup Penetration/Aspiration during swallow;Reduced epiglottic inversion Pharyngeal Material enters airway, remains ABOVE vocal cords then ejected out Pharyngeal- Thin Straw Penetration/Aspiration during swallow;Reduced epiglottic inversion Pharyngeal Material enters airway, remains ABOVE vocal cords then ejected out Pharyngeal- Puree -- Pharyngeal -- Pharyngeal- Mechanical Soft -- Pharyngeal -- Pharyngeal- Regular WFL Pharyngeal -- Pharyngeal- Multi-consistency -- Pharyngeal -- Pharyngeal- Pill -- Pharyngeal -- Pharyngeal Comment --  CHL IP CERVICAL ESOPHAGEAL PHASE 07/15/2019 Cervical Esophageal Phase WFL Pudding Teaspoon -- Pudding Cup -- Honey Teaspoon -- Honey Cup -- Nectar Teaspoon -- Nectar Cup -- Nectar Straw -- Thin Teaspoon -- Thin Cup -- Thin Straw -- Puree -- Mechanical Soft -- Regular -- Multi-consistency -- Pill -- Cervical Esophageal Comment -- [name] 07/15/2019, 12:04 PM [name].Ed Actor Pager [phone] Office [phone]              Ir US Chest  Result Date: 07/20/2019 CLINICAL DATA:  Right pleural effusion. EXAM: CHEST ULTRASOUND COMPARISON:   Chest x-ray earlier today. FINDINGS: By ultrasound, there is only a small amount of right pleural fluid. There was not enough fluid present to allow for thoracentesis today. IMPRESSION: Small right pleural effusion. Thoracentesis was not able to be safely performed today. Electronically Signed   By: Aletta Edouard M.D.   On: 07/20/2019 18:13   US Abdomen Limited Ruq  Result Date: 07/19/2019 CLINICAL DATA:  63 year old female with hepatic encephalopathy. EXAM: ULTRASOUND ABDOMEN LIMITED RIGHT UPPER QUADRANT COMPARISON:  CT of the abdomen pelvis dated 07/12/2019. FINDINGS: Gallbladder: The gallbladder is predominantly contracted. No gallstones or wall thickening visualized. No sonographic Murphy sign noted by sonographer. Common bile duct: Diameter: 6 mm Liver: The liver is unremarkable. Portal vein is patent on color Doppler imaging with normal direction of blood flow towards the liver. Other: Partially visualized right pleural effusion. IMPRESSION: 1. Unremarkable right upper quadrant ultrasound. 2. Right pleural effusion. Electronically Signed   By: Anner Crete M.D.   On: 07/19/2019 16:26    11/10: Transthoracic Echocardiogram   IMPRESSIONS   1. Left ventricular ejection fraction, by visual estimation, is 45 to 50%. The left ventricle has normal function. There is no left ventricular hypertrophy. 2. Left ventricular diastolic parameters are consistent with Grade I diastolic dysfunction (impaired relaxation). 3. The left ventricle demonstrates global hypokinesis. 4. Global right ventricle has normal systolic function.The right ventricular size is normal. No increase in right ventricular wall thickness. 5. Left atrial size was normal. 6. Right atrial size was normal. 7. Trivial pericardial effusion is present. 8. The mitral valve is normal in structure. Mild mitral valve regurgitation. No evidence of mitral stenosis. 9. The tricuspid valve is normal in structure. Tricuspid valve  regurgitation is trivial. 10. The aortic valve is tricuspid. Aortic valve regurgitation is not visualized. Mild aortic valve sclerosis without  stenosis. 11. TR signal is inadequate for assessing pulmonary artery systolic pressure. 12. The inferior vena cava is dilated in size with >50% respiratory variability, suggesting right atrial pressure of 8 mmHg.  Subjective: Patient seen and examined at bedside.  She feels better and was stable.  Denies any overnight fever or vomiting or shortness of breath.  Discharge Exam: Vitals:   07/20/19 2152 07/21/19 0544  BP: 125/90 (!) 156/94  Pulse: (!) 112 (!) 105  Resp:    Temp: 99.6 F (37.6 C) 98.9 F (37.2 C)  SpO2: 97% 96%    General: Pt is alert, awake, not in acute distress Cardiovascular: rate controlled, S1/S2 + Respiratory: bilateral decreased breath sounds at bases with some scattered crackles Abdominal: Soft, NT, ND, bowel sounds + Extremities: Trace lower extremity edema, no cyanosis    The results of significant diagnostics from this hospitalization (including imaging, microbiology, ancillary and laboratory) are listed below for reference.     Microbiology: Recent Results (from the past 240 hour(s))  SARS CORONAVIRUS 2 (TAT 6-24 HRS) Nasopharyngeal Nasopharyngeal Swab     Status: None   Collection Time: 07/12/19  1:25 PM   Specimen: Nasopharyngeal Swab  Result Value Ref Range Status   SARS Coronavirus 2 NEGATIVE NEGATIVE Final    Comment: (NOTE) SARS-CoV-2 target nucleic acids are NOT DETECTED. The SARS-CoV-2 RNA is generally detectable in upper and lower respiratory specimens during the acute phase of infection. Negative results do not preclude SARS-CoV-2 infection, do not rule out co-infections with other pathogens, and should not be used as the sole basis for treatment or other patient management decisions. Negative results must be combined with clinical observations, patient history, and epidemiological information.  The expected result is Negative. Fact Sheet for Patients: SugarRoll.be Fact Sheet for Healthcare Providers: https://www.woods-mathews.com/ This test is not yet approved or cleared by the [place] FDA and  has been authorized for detection and/or diagnosis of SARS-CoV-2 by FDA under an Emergency Use Authorization (EUA). This EUA will remain  in effect (meaning this test can be used) for the duration of the COVID-19 declaration under Section 56 4(b)(1) of the Act, 21 U.S.C. section 360bbb-3(b)(1), unless the authorization is terminated or revoked sooner. Performed at [facility], [place] [address], [place], [place] [address]   Blood culture (routine x 2)     Status: Abnormal   Collection Time: 07/12/19  1:30 PM   Specimen: BLOOD  Result Value Ref Range Status   Specimen Description BLOOD LEFT ANTECUBITAL  Final   Special Requests   Final    BOTTLES DRAWN AEROBIC AND ANAEROBIC Blood Culture adequate volume   Culture  Setup Time   Final    GRAM POSITIVE COCCI IN CHAINS AEROBIC BOTTLE ONLY CRITICAL RESULT CALLED TO, READ BACK BY AND VERIFIED WITH: PHARMD J FRAENS [id number] AT 60 AM    Culture (A)  Final    STREPTOCOCCUS PNEUMONIAE SUSCEPTIBILITIES PERFORMED ON PREVIOUS CULTURE WITHIN THE LAST 5 DAYS. Performed at [facility], [place] [address], [place], [place] [address]    Report Status 07/15/2019 FINAL  Final  Blood culture (routine x 2)     Status: Abnormal   Collection Time: 07/12/19  1:30 PM   Specimen: BLOOD  Result Value Ref Range Status   Specimen Description BLOOD RIGHT ANTECUBITAL  Final   Special Requests   Final    BOTTLES DRAWN AEROBIC AND ANAEROBIC Blood Culture adequate volume   Culture  Setup Time   Final  GRAM POSITIVE COCCI IN CHAINS IN BOTH AEROBIC AND ANAEROBIC BOTTLES CRITICAL RESULT CALLED TO, READ BACK BY AND VERIFIED WITHDaun Peacock, PHARMD, AT 1048 07/13/19 BY D. VANHOOK Performed at [facility], [place] [address] [place], [place] [address]    Culture STREPTOCOCCUS PNEUMONIAE (A)  Final   Report Status 07/15/2019 FINAL  Final   Organism ID, Bacteria STREPTOCOCCUS PNEUMONIAE  Final      Susceptibility   Streptococcus pneumoniae - MIC*    ERYTHROMYCIN <=0.12 SENSITIVE Sensitive     LEVOFLOXACIN 1 SENSITIVE Sensitive     VANCOMYCIN 0.25 SENSITIVE Sensitive     PENICILLIN (non-meningitis) <=0.06 SENSITIVE Sensitive     CEFTRIAXONE (non-meningitis) <=0.12 SENSITIVE Sensitive     * STREPTOCOCCUS PNEUMONIAE  Blood Culture ID Panel (Reflexed)     Status: Abnormal   Collection Time: 07/12/19  1:30 PM  Result Value Ref Range Status   Enterococcus species NOT DETECTED NOT DETECTED Final   Listeria monocytogenes NOT DETECTED NOT DETECTED Final   Staphylococcus species NOT DETECTED NOT DETECTED Final   Staphylococcus aureus (BCID) NOT DETECTED NOT DETECTED Final   Streptococcus species DETECTED (A) NOT DETECTED Final    Comment: CRITICAL RESULT CALLED TO, READ BACK BY AND VERIFIED WITH: PHARMD J FRAENS [id number] AT 847 AM BY CM    Streptococcus agalactiae NOT DETECTED NOT DETECTED Final   Streptococcus pneumoniae DETECTED (A) NOT DETECTED Final    Comment: CRITICAL RESULT CALLED TO, READ BACK BY AND VERIFIED WITH: PHARMD J FRAENS [id number] AT 847 AM BY CM    Streptococcus pyogenes NOT DETECTED NOT DETECTED Final   Acinetobacter baumannii NOT DETECTED NOT DETECTED Final   Enterobacteriaceae species NOT DETECTED NOT DETECTED Final   Enterobacter cloacae complex NOT DETECTED NOT DETECTED Final   Escherichia coli NOT DETECTED NOT DETECTED Final   Klebsiella oxytoca NOT DETECTED NOT DETECTED Final   Klebsiella pneumoniae NOT DETECTED NOT DETECTED Final   Proteus species NOT DETECTED NOT DETECTED Final   Serratia marcescens NOT DETECTED NOT DETECTED Final   Haemophilus influenzae NOT DETECTED NOT DETECTED Final   Neisseria meningitidis NOT DETECTED NOT DETECTED Final   Pseudomonas  aeruginosa NOT DETECTED NOT DETECTED Final   Candida albicans NOT DETECTED NOT DETECTED Final   Candida glabrata NOT DETECTED NOT DETECTED Final   Candida krusei NOT DETECTED NOT DETECTED Final   Candida parapsilosis NOT DETECTED NOT DETECTED Final   Candida tropicalis NOT DETECTED NOT DETECTED Final    Comment: Performed at [facility], [address] [place], [place] [address]  MRSA PCR Screening     Status: None   Collection Time: 07/12/19 11:48 PM   Specimen: Nasal Mucosa; Nasopharyngeal  Result Value Ref Range Status   MRSA by PCR NEGATIVE NEGATIVE Final    Comment:        The GeneXpert MRSA Assay (FDA approved for NASAL specimens only), is one component of a comprehensive MRSA colonization surveillance program. It is not intended to diagnose MRSA infection nor to guide or monitor treatment for MRSA infections. Performed at [facility], [place] [address] [place], [place] [address]   Culture, blood (routine x 2)     Status: None   Collection Time: 07/15/19  1:29 PM   Specimen: BLOOD RIGHT HAND  Result Value Ref Range Status   Specimen Description BLOOD RIGHT HAND  Final   Special Requests   Final    BOTTLES DRAWN AEROBIC ONLY Blood Culture adequate volume  Culture   Final    NO GROWTH 5 DAYS Performed at [facility], [place] [address] [place], [place] [address]    Report Status 07/20/2019 FINAL  Final  Culture, blood (routine x 2)     Status: None   Collection Time: 07/15/19  1:29 PM   Specimen: BLOOD RIGHT HAND  Result Value Ref Range Status   Specimen Description BLOOD RIGHT HAND  Final   Special Requests   Final    BOTTLES DRAWN AEROBIC ONLY Blood Culture adequate volume   Culture   Final    NO GROWTH 5 DAYS Performed at [facility], [place] [address], [place], [place] [address]    Report Status 07/20/2019 FINAL  Final     Labs: BNP (last 3 results) Recent Labs    05/31/19 1040  BNP [id number]*   Basic Metabolic Panel: Recent Labs   Lab 07/17/19 0747 07/17/19 1153 07/18/19 0306 07/19/19 0243 07/20/19 0300 07/21/19 0252  NA 140  --  136 134* 133* 132*  K 2.9* 3.2* 3.3* 3.6 3.3* 3.6  CL 99  --  97* 94* 95* 96*  CO2 29  --  28 28 26 25   GLUCOSE 102*  --  103* 91 96 97  BUN <5*  --  <5* <5* <5* <5*  CREATININE 0.47  --  0.50 0.55 0.50 0.47  CALCIUM 7.5*  --  7.6* 8.1* 8.3* 8.6*  MG 1.3*  --   --   --   --   --    Liver Function Tests: No results for input(s): AST, ALT, ALKPHOS, BILITOT, PROT, ALBUMIN in the last 168 hours. No results for input(s): LIPASE, AMYLASE in the last 168 hours. Recent Labs  Lab 07/17/19 1153  AMMONIA 62*   CBC: Recent Labs  Lab 07/17/19 0747 07/18/19 0306 07/19/19 0243 07/20/19 0300 07/21/19 0252  WBC 12.0* 12.6* 11.1* 11.6* 9.6  HGB 8.6* 8.2* 8.4* 8.8* 9.5*  HCT 27.7* 26.4* 26.5* 28.5* 31.1*  MCV 78.2* 78.8* 78.2* 78.3* 79.3*  PLT 830* 800* 793* 848* 865*   Cardiac Enzymes: No results for input(s): CKTOTAL, CKMB, CKMBINDEX, TROPONINI in the last 168 hours. BNP: Invalid input(s): POCBNP CBG: No results for input(s): GLUCAP in the last 168 hours. D-Dimer No results for input(s): DDIMER in the last 72 hours. Hgb A1c No results for input(s): HGBA1C in the last 72 hours. Lipid Profile No results for input(s): CHOL, HDL, LDLCALC, TRIG, CHOLHDL, LDLDIRECT in the last 72 hours. Thyroid function studies No results for input(s): TSH, T4TOTAL, T3FREE, THYROIDAB in the last 72 hours.  Invalid input(s): FREET3 Anemia work up No results for input(s): VITAMINB12, FOLATE, FERRITIN, TIBC, IRON, RETICCTPCT in the last 72 hours. Urinalysis    Component Value Date/Time   COLORURINE ORANGE (A) 07/13/2019 0908   APPEARANCEUR CLOUDY (A) 07/13/2019 0908   LABSPEC 1.010 07/13/2019 0908   PHURINE 6.5 07/13/2019 0908   GLUCOSEU NEGATIVE 07/13/2019 0908   HGBUR TRACE (A) 07/13/2019 0908   BILIRUBINUR SMALL (A) 07/13/2019 0908   KETONESUR NEGATIVE 07/13/2019 0908   PROTEINUR 30 (A)  07/13/2019 0908   UROBILINOGEN 0.2 10/08/2012 1224   NITRITE POSITIVE (A) 07/13/2019 0908   LEUKOCYTESUR NEGATIVE 07/13/2019 0908   Sepsis Labs Invalid input(s): PROCALCITONIN,  WBC,  LACTICIDVEN Microbiology Recent Results (from the past 240 hour(s))  SARS CORONAVIRUS 2 (TAT 6-24 HRS) Nasopharyngeal Nasopharyngeal Swab     Status: None   Collection Time: 07/12/19  1:25 PM   Specimen: Nasopharyngeal Swab  Result Value Ref Range Status   SARS Coronavirus 2 NEGATIVE NEGATIVE Final    Comment: (NOTE) SARS-CoV-2 target nucleic acids are NOT DETECTED. The SARS-CoV-2 RNA is generally detectable in upper and lower respiratory specimens during the acute phase of infection. Negative results do not preclude SARS-CoV-2 infection, do not rule out co-infections with other pathogens, and should not be used as the sole basis for treatment or other patient management decisions. Negative results must be combined with clinical observations, patient history, and epidemiological information. The expected result is Negative. Fact Sheet for Patients: SugarRoll.be Fact Sheet for Healthcare Providers: https://www.woods-mathews.com/ This test is not yet approved or cleared by the [place] FDA and  has been authorized for detection and/or diagnosis of SARS-CoV-2 by FDA under an Emergency Use Authorization (EUA). This EUA will remain  in effect (meaning this test can be used) for the duration of the COVID-19 declaration under Section 56 4(b)(1) of the Act, 21 U.S.C. section 360bbb-3(b)(1), unless the authorization is terminated or revoked sooner. Performed at [facility], [place] [address] [place], [place] [address]   Blood culture (routine x 2)     Status: Abnormal   Collection Time: 07/12/19  1:30 PM   Specimen: BLOOD  Result Value Ref Range Status   Specimen Description BLOOD LEFT ANTECUBITAL  Final   Special Requests   Final    BOTTLES DRAWN  AEROBIC AND ANAEROBIC Blood Culture adequate volume   Culture  Setup Time   Final    GRAM POSITIVE COCCI IN CHAINS AEROBIC BOTTLE ONLY CRITICAL RESULT CALLED TO, READ BACK BY AND VERIFIED WITH: PHARMD J FRAENS [id number] AT 55 AM    Culture (A)  Final    STREPTOCOCCUS PNEUMONIAE SUSCEPTIBILITIES PERFORMED ON PREVIOUS CULTURE WITHIN THE LAST 5 DAYS. Performed at [facility], [place] [address] [place], [place] [address]    Report Status 07/15/2019 FINAL  Final  Blood culture (routine x 2)     Status: Abnormal   Collection Time: 07/12/19  1:30 PM   Specimen: BLOOD  Result Value Ref Range Status   Specimen Description BLOOD RIGHT ANTECUBITAL  Final   Special Requests   Final    BOTTLES DRAWN AEROBIC AND ANAEROBIC Blood Culture adequate volume   Culture  Setup Time   Final    GRAM POSITIVE COCCI IN CHAINS IN BOTH AEROBIC AND ANAEROBIC BOTTLES CRITICAL RESULT CALLED TO, READ BACK BY AND VERIFIED WITHDaun Peacock, PHARMD, AT 1048 07/13/19 BY D. VANHOOK Performed at [facility], [place] [address] [place], [place] [address]    Culture STREPTOCOCCUS PNEUMONIAE (A)  Final   Report Status 07/15/2019 FINAL  Final   Organism ID, Bacteria STREPTOCOCCUS PNEUMONIAE  Final      Susceptibility   Streptococcus pneumoniae - MIC*    ERYTHROMYCIN <=0.12 SENSITIVE Sensitive     LEVOFLOXACIN 1 SENSITIVE Sensitive     VANCOMYCIN 0.25 SENSITIVE Sensitive     PENICILLIN (non-meningitis) <=0.06 SENSITIVE Sensitive     CEFTRIAXONE (non-meningitis) <=0.12 SENSITIVE Sensitive     * STREPTOCOCCUS PNEUMONIAE  Blood Culture ID Panel (Reflexed)     Status: Abnormal   Collection Time: 07/12/19  1:30 PM  Result Value Ref Range Status   Enterococcus species NOT DETECTED NOT DETECTED Final   Listeria monocytogenes NOT DETECTED NOT DETECTED Final   Staphylococcus species NOT DETECTED NOT DETECTED Final   Staphylococcus aureus (BCID) NOT DETECTED NOT DETECTED Final   Streptococcus species DETECTED (A) NOT  DETECTED Final  Comment: CRITICAL RESULT CALLED TO, READ BACK BY AND VERIFIED WITH: PHARMD J FRAENS [id number] AT 55 AM BY CM    Streptococcus agalactiae NOT DETECTED NOT DETECTED Final   Streptococcus pneumoniae DETECTED (A) NOT DETECTED Final    Comment: CRITICAL RESULT CALLED TO, READ BACK BY AND VERIFIED WITH: PHARMD J FRAENS [id number] AT 847 AM BY CM    Streptococcus pyogenes NOT DETECTED NOT DETECTED Final   Acinetobacter baumannii NOT DETECTED NOT DETECTED Final   Enterobacteriaceae species NOT DETECTED NOT DETECTED Final   Enterobacter cloacae complex NOT DETECTED NOT DETECTED Final   Escherichia coli NOT DETECTED NOT DETECTED Final   Klebsiella oxytoca NOT DETECTED NOT DETECTED Final   Klebsiella pneumoniae NOT DETECTED NOT DETECTED Final   Proteus species NOT DETECTED NOT DETECTED Final   Serratia marcescens NOT DETECTED NOT DETECTED Final   Haemophilus influenzae NOT DETECTED NOT DETECTED Final   Neisseria meningitidis NOT DETECTED NOT DETECTED Final   Pseudomonas aeruginosa NOT DETECTED NOT DETECTED Final   Candida albicans NOT DETECTED NOT DETECTED Final   Candida glabrata NOT DETECTED NOT DETECTED Final   Candida krusei NOT DETECTED NOT DETECTED Final   Candida parapsilosis NOT DETECTED NOT DETECTED Final   Candida tropicalis NOT DETECTED NOT DETECTED Final    Comment: Performed at [facility], [place] [place], [place] [address]  MRSA PCR Screening     Status: None   Collection Time: 07/12/19 11:48 PM   Specimen: Nasal Mucosa; Nasopharyngeal  Result Value Ref Range Status   MRSA by PCR NEGATIVE NEGATIVE Final    Comment:        The GeneXpert MRSA Assay (FDA approved for NASAL specimens only), is one component of a comprehensive MRSA colonization surveillance program. It is not intended to diagnose MRSA infection nor to guide or monitor treatment for MRSA infections. Performed at [facility], [place] [address], [place], [place] [address]    Culture, blood (routine x 2)     Status: None   Collection Time: 07/15/19  1:29 PM   Specimen: BLOOD RIGHT HAND  Result Value Ref Range Status   Specimen Description BLOOD RIGHT HAND  Final   Special Requests   Final    BOTTLES DRAWN AEROBIC ONLY Blood Culture adequate volume   Culture   Final    NO GROWTH 5 DAYS Performed at [facility], [place] [address], [place], [place] [address]    Report Status 07/20/2019 FINAL  Final  Culture, blood (routine x 2)     Status: None   Collection Time: 07/15/19  1:29 PM   Specimen: BLOOD RIGHT HAND  Result Value Ref Range Status   Specimen Description BLOOD RIGHT HAND  Final   Special Requests   Final    BOTTLES DRAWN AEROBIC ONLY Blood Culture adequate volume   Culture   Final    NO GROWTH 5 DAYS Performed at [facility], [place] [address], [place],  [address]    Report Status 07/20/2019 FINAL  Final     Time coordinating discharge: 35 minutes  SIGNED:   Aline August, MD  [facility] 07/21/2019, 9:07 AM

## 2019-07-21 NOTE — Plan of Care (Signed)
  Problem: Education: Goal: Knowledge of General Education information will improve Description: Including pain rating scale, medication(s)/side effects and non-pharmacologic comfort measures Outcome: Adequate for Discharge   

## 2019-07-25 LAB — CULTURE, BLOOD (ROUTINE X 2)
Culture: NO GROWTH
Culture: NO GROWTH

## 2019-08-03 ENCOUNTER — Ambulatory Visit: Payer: Medicaid Other | Attending: Family Medicine | Admitting: Family Medicine

## 2019-08-03 ENCOUNTER — Other Ambulatory Visit: Payer: Self-pay

## 2019-08-03 DIAGNOSIS — I5042 Chronic combined systolic (congestive) and diastolic (congestive) heart failure: Secondary | ICD-10-CM | POA: Diagnosis not present

## 2019-08-03 DIAGNOSIS — F1010 Alcohol abuse, uncomplicated: Secondary | ICD-10-CM | POA: Diagnosis not present

## 2019-08-03 DIAGNOSIS — I110 Hypertensive heart disease with heart failure: Secondary | ICD-10-CM

## 2019-08-03 DIAGNOSIS — D6489 Other specified anemias: Secondary | ICD-10-CM

## 2019-08-03 MED ORDER — POTASSIUM CHLORIDE CRYS ER 20 MEQ PO TBCR: 20.0000 meq | EXTENDED_RELEASE_TABLET | Freq: Every day | ORAL | 3 refills | Status: DC

## 2019-08-03 MED ORDER — THIAMINE HCL 100 MG PO TABS: 100.0000 mg | ORAL_TABLET | Freq: Every day | ORAL | 3 refills | Status: DC

## 2019-08-03 MED ORDER — FUROSEMIDE 40 MG PO TABS: 40.0000 mg | ORAL_TABLET | Freq: Every day | ORAL | 3 refills | Status: DC

## 2019-08-03 NOTE — Progress Notes (Signed)
Patient has been called and DOB has been verified. Patient has been screened and transferred to PCP to start phone visit.     

## 2019-08-03 NOTE — Progress Notes (Signed)
Virtual Visit via Telephone Note  I connected with Angel Hebert, on 08/03/2019 at 2:08 PM by telephone due to the COVID-19 pandemic and verified that I am speaking with the correct person using two identifiers.   Consent: I discussed the limitations, risks, security and privacy concerns of performing an evaluation and management service by telephone and the availability of in person appointments. I also discussed with the patient that there may be a patient responsible charge related to this service. The patient expressed understanding and agreed to proceed.   Location of Patient: Home  Location of Provider: Clinic   Persons participating in Telemedicine visit: Jehieli Bernabei Farrington-CMA Dr. Margarita Rana     History of Present Illness: Angel Hebert is a 63 year old female with a history of hypertension, hyperlipidemia, asthma, allergic rhinitis, nasal polyps (Status post sinus surgery in 10/2016), GERD, bilateral knee osteoarthritis (status post left total knee arthroplasty in 09/2016) seen for follow-up visit after hospitalization from 07/12/2019 through 07/21/2019.   She had presented with hemoglobin of 5.2, status post 2 units of PRBC; she had no evidence of GI bleed and did not have any GI procedures.  Also found to have strep pneumo bacteremia for which she was treated with IV antibiotics.  Echocardiogram was negative for vegetations but revealed EF of 45 to 50% with a new diagnosis of CHF and she was commenced on Lasix. She did have acute metabolic encephalopathy, CT head was negative for acute intracranial process but she had elevated ammonia level for which she was commenced on lactulose.  She did have a history of significant alcohol consumption.  Abdominal ultrasound was negative for cirrhosis but revealed right pleural effusion. L3 compression fracture also noted during hospitalization.  Today states her breathing is getting better. She has a non  productive cough. She was taking amoxicillin which she was discharged on daily rather than three times daily and so still has two capsules left to take. She has to stop and catch her breath when walking she states but has been compliance with Lasix.  Denies presence of pedal edema. She has not had an alcoholic  drink since discharge previously drank alcoholic drinks back to back and is unable to quantify this at this time. She denies presence of blood in stool, hematemesis  Past Medical History:  Diagnosis Date  . Arthritis   . Asthma    last year in December  . Dyspnea   . GERD (gastroesophageal reflux disease)   . High cholesterol   . Hypertension   . Pneumonia 01/2017   No Known Allergies  Current Outpatient Medications on File Prior to Visit  Medication Sig Dispense Refill  . albuterol (PROVENTIL) (2.5 MG/3ML) 0.083% nebulizer solution Take 3 mLs (2.5 mg total) by nebulization every 6 (six) hours as needed for wheezing or shortness of breath. 75 mL 3  . ALL DAY ALLERGY 10 MG tablet TAKE 1 TABLET BY MOUTH DAILY. 30 tablet 2  . atorvastatin (LIPITOR) 10 MG tablet Take 1 tablet (10 mg total) by mouth daily at 6 PM. 30 tablet 6  . benzonatate (TESSALON) 200 MG capsule Take 1 capsule (200 mg total) by mouth 3 (three) times daily as needed for cough. 20 capsule 0  . fluticasone (FLONASE) 50 MCG/ACT nasal spray Place 2 sprays into both nostrils daily. 16 g 6  . Fluticasone-Salmeterol (ADVAIR DISKUS) 500-50 MCG/DOSE AEPB INHALE 1 PUFF INTO THE LUNGS EVERY 12 (TWELVE) HOURS. 60 each 6  . furosemide (LASIX) 40 MG tablet Take 1 tablet (  40 mg total) by mouth daily. 30 tablet 0  . lactulose (CHRONULAC) 10 GM/15ML solution Take 15 mLs (10 g total) by mouth 2 (two) times daily. 473 mL 0  . losartan-hydrochlorothiazide (HYZAAR) 100-25 MG tablet Take 1 tablet by mouth daily. 90 tablet 1  . montelukast (SINGULAIR) 10 MG tablet Take 1 tablet (10 mg total) by mouth at bedtime. 30 tablet 6  . naproxen  (NAPROSYN) 500 MG tablet TAKE 1 TABLET BY MOUTH 2 TIMES DAILY 30 tablet 0  . ondansetron (ZOFRAN) 4 MG tablet Take 1 tablet (4 mg total) by mouth every 6 (six) hours as needed for nausea. 20 tablet 0  . pantoprazole (PROTONIX) 20 MG tablet Take 1 tablet (20 mg total) by mouth daily. 30 tablet 5  . potassium chloride SA (KLOR-CON) 20 MEQ tablet Take 1 tablet (20 mEq total) by mouth daily. 30 tablet 0  . PROAIR HFA 108 (90 Base) MCG/ACT inhaler Inhale 2 puffs into the lungs every 6 (six) hours as needed for wheezing or shortness of breath. 8.5 g 5  . thiamine 100 MG tablet Take 1 tablet (100 mg total) by mouth daily. 30 tablet 0  . traZODone (DESYREL) 50 MG tablet Take 1 tablet (50 mg total) by mouth at bedtime as needed for sleep.     No current facility-administered medications on file prior to visit.     Observations/Objective: Alert, awake, oriented x3 No acute distress  CMP Latest Ref Rng & Units 07/21/2019 07/20/2019 07/19/2019  Glucose 70 - 99 mg/dL 97 96 91  BUN 8 - 23 mg/dL <5(L) <5(L) <5(L)  Creatinine 0.44 - 1.00 mg/dL 0.47 0.50 0.55  Sodium 135 - 145 mmol/L 132(L) 133(L) 134(L)  Potassium 3.5 - 5.1 mmol/L 3.6 3.3(L) 3.6  Chloride 98 - 111 mmol/L 96(L) 95(L) 94(L)  CO2 22 - 32 mmol/L 25 26 28   Calcium 8.9 - 10.3 mg/dL 8.6(L) 8.3(L) 8.1(L)  Total Protein 6.5 - 8.1 g/dL - - -  Total Bilirubin 0.3 - 1.2 mg/dL - - -  Alkaline Phos 38 - 126 U/L - - -  AST 15 - 41 U/L - - -  ALT 0 - 44 U/L - - -    Lipid Panel     Component Value Date/Time   CHOL 185 10/22/2018 0920   TRIG 129 10/22/2018 0920   HDL 81 10/22/2018 0920   CHOLHDL 2.3 10/22/2018 0920   CHOLHDL 2.5 05/20/2016 1452   VLDL 26 05/20/2016 1452   LDLCALC 78 10/22/2018 0920   LABVLDL 26 10/22/2018 0920    CBC    Component Value Date/Time   WBC 9.6 07/21/2019 0252   RBC 3.92 07/21/2019 0252   HGB 9.5 (L) 07/21/2019 0252   HGB 12.5 10/29/2017 1007   HCT 31.1 (L) 07/21/2019 0252   HCT 37.6 10/29/2017 1007    PLT 865 (H) 07/21/2019 0252   PLT 415 (H) 10/29/2017 1007   MCV 79.3 (L) 07/21/2019 0252   MCV 100 (H) 10/29/2017 1007   MCH 24.2 (L) 07/21/2019 0252   MCHC 30.5 07/21/2019 0252   RDW 26.5 (H) 07/21/2019 0252   RDW 16.4 (H) 10/29/2017 1007   LYMPHSABS 1.9 07/14/2019 0240   LYMPHSABS 1.7 10/29/2017 1007   MONOABS 1.3 (H) 07/14/2019 0240   EOSABS 0.0 07/14/2019 0240   EOSABS 0.4 10/29/2017 1007   BASOSABS 0.1 07/14/2019 0240   BASOSABS 0.1 10/29/2017 1007     Assessment and Plan: 1. Anemia due to other cause, not classified Discharge hemoglobin  was 9.5 We will check CBC again She denies ongoing evidence of GI bleed - CBC with Differential/Platelet; Future  2. Hypertensive heart disease with chronic combined systolic and diastolic congestive heart failure ([place]) New diagnosis with EF of 45 to 50% Continue Lasix - Ambulatory referral to Cardiology - furosemide (LASIX) 40 MG tablet; Take 1 tablet (40 mg total) by mouth daily.  Dispense: 30 tablet; Refill: 3 - potassium chloride SA (KLOR-CON) 20 MEQ tablet; Take 1 tablet (20 mEq total) by mouth daily.  Dispense: 30 tablet; Refill: 3 - Basic Metabolic Panel; Future  3. Alcohol abuse She has quit alcohol and has been commended - thiamine 100 MG tablet; Take 1 tablet (100 mg total) by mouth daily.  Dispense: 30 tablet; Refill: 3   Follow Up Instructions: 3 months   I discussed the assessment and treatment plan with the patient. The patient was provided an opportunity to ask questions and all were answered. The patient agreed with the plan and demonstrated an understanding of the instructions.   The patient was advised to call back or seek an in-person evaluation if the symptoms worsen or if the condition fails to improve as anticipated.     I provided 21 minutes total of non-face-to-face time during this encounter including median intraservice time, reviewing previous notes, labs, imaging, medications, management and patient  verbalized understanding.     Charlott Rakes, MD, FAAFP. [facility] and [place], [place]   08/03/2019, 2:08 PM

## 2019-08-05 ENCOUNTER — Encounter: Payer: Self-pay | Admitting: Interventional Cardiology

## 2019-08-05 ENCOUNTER — Ambulatory Visit (INDEPENDENT_AMBULATORY_CARE_PROVIDER_SITE_OTHER): Payer: Medicaid Other | Admitting: Interventional Cardiology

## 2019-08-05 ENCOUNTER — Other Ambulatory Visit: Payer: Self-pay

## 2019-08-05 DIAGNOSIS — I5042 Chronic combined systolic (congestive) and diastolic (congestive) heart failure: Secondary | ICD-10-CM | POA: Diagnosis not present

## 2019-08-05 DIAGNOSIS — E7800 Pure hypercholesterolemia, unspecified: Secondary | ICD-10-CM | POA: Diagnosis not present

## 2019-08-05 DIAGNOSIS — I10 Essential (primary) hypertension: Secondary | ICD-10-CM

## 2019-08-05 DIAGNOSIS — I110 Hypertensive heart disease with heart failure: Secondary | ICD-10-CM | POA: Diagnosis not present

## 2019-08-05 NOTE — Progress Notes (Signed)
Cardiology Office Note   Date:  08/05/2019   ID:  Angel Hebert, DOB March 16, 1956, MRN [id number]  PCP:  Angel Rakes, MD    No chief complaint on file.  Hypertensive heart disease  Wt Readings from Last 3 Encounters:  08/05/19 150 lb (68 kg)  07/21/19 143 lb 11.8 oz (65.2 kg)  05/31/19 159 lb 3.2 oz (72.2 kg)       History of Present Illness: Angel Hebert is a 63 y.o. female  Who has had hypertensive heart disease.   She was in the hospital in 07/2019: "Strep pneumoniae bacteremia and multifocal pneumonia: Present on admission Sepsis: Present on admission.  Resolved -Patient was initially started on broad-spectrum antibiotics and subsequently switched to Rocephin.  ID was consulted and antibiotics was switched to penicillin G IV, patient is currently on penicillin G. -Repeat blood cultures from 07/15/2019 have been negative so far -TTE did not show any vegetations -Currently afebrile and hemodynamically stable and on room air.  Will discharge on amoxicillin for 5 more days as per ID recommendations  Symptomatic anemia -Unknown baseline.  Presented with hemoglobin of 5.2 with no overt GI bleed.  Status post 2 units packed red cells transfusion during this admission.  Hemoglobin has stayed stable subsequently.  Hemoglobin 9.5 today.  Might need outpatient GI evaluation  Acute systolic heart failure -EF of 45 to 50% on recent TTE.  Currently on intravenous Lasix.  Will discharge on Lasix 40 mg daily.  Outpatient follow-up with cardiology.  Acute metabolic encephalopathy causing confusion -CT head was unremarkable for acute process but significant for small vessel white matter disease and extensive periventricular/deep white matter hypodensity. -Unclear etiology.  Unclear if this is secondary to alcohol withdrawal.  Continue thiamine. -Ammonia level elevated at 63 and right upper quadrant ultrasound was unremarkable for liver pathology.  Continue lactulose  -Mental status waxing and waning but currently stable.  Might have underlying cognitive disease.  Might need outpatient neurology evaluation."  No appt with GI is set up.  She feels better.  Has some pleuritic pain when she takes a deep breath- improving with time.  Felt worse when she had pneumonia.    Walks regularly at home.  No exertional chest pain.  Denies : Chest pain. Dizziness. Leg edema. Nitroglycerin use. Orthopnea. Palpitations. Paroxysmal nocturnal dyspnea. Shortness of breath. Syncope.     Past Medical History:  Diagnosis Date  . Arthritis   . Asthma    last year in December  . Dyspnea   . GERD (gastroesophageal reflux disease)   . High cholesterol   . Hypertension   . Pneumonia 01/2017    Past Surgical History:  Procedure Laterality Date  . ABDOMINAL HYSTERECTOMY    . nasal polyps     removed just this past tuesday at [facility] facility over by [facility]  . TOTAL KNEE ARTHROPLASTY Left 10/01/2016   Procedure: LEFT TOTAL KNEE ARTHROPLASTY;  Surgeon: Angel Pel, MD;  Location: [facility];  Service: Orthopedics;  Laterality: Left;     Current Outpatient Medications  Medication Sig Dispense Refill  . albuterol (PROVENTIL) (2.5 MG/3ML) 0.083% nebulizer solution Take 3 mLs (2.5 mg total) by nebulization every 6 (six) hours as needed for wheezing or shortness of breath. 75 mL 3  . ALL DAY ALLERGY 10 MG tablet TAKE 1 TABLET BY MOUTH DAILY. 30 tablet 2  . atorvastatin (LIPITOR) 10 MG tablet Take 1 tablet (10 mg total) by mouth daily at 6 PM. 30 tablet 6  .  benzonatate (TESSALON) 200 MG capsule Take 1 capsule (200 mg total) by mouth 3 (three) times daily as needed for cough. 20 capsule 0  . fluticasone (FLONASE) 50 MCG/ACT nasal spray Place 2 sprays into both nostrils daily. 16 g 6  . Fluticasone-Salmeterol (ADVAIR DISKUS) 500-50 MCG/DOSE AEPB INHALE 1 PUFF INTO THE LUNGS EVERY 12 (TWELVE) HOURS. 60 each 6  . furosemide (LASIX) 40 MG tablet Take 1 tablet (40 mg  total) by mouth daily. 30 tablet 3  . lactulose (CHRONULAC) 10 GM/15ML solution Take 15 mLs (10 g total) by mouth 2 (two) times daily. 473 mL 0  . losartan-hydrochlorothiazide (HYZAAR) 100-25 MG tablet Take 1 tablet by mouth daily. 90 tablet 1  . montelukast (SINGULAIR) 10 MG tablet Take 1 tablet (10 mg total) by mouth at bedtime. 30 tablet 6  . naproxen (NAPROSYN) 500 MG tablet TAKE 1 TABLET BY MOUTH 2 TIMES DAILY 30 tablet 0  . ondansetron (ZOFRAN) 4 MG tablet Take 1 tablet (4 mg total) by mouth every 6 (six) hours as needed for nausea. 20 tablet 0  . pantoprazole (PROTONIX) 20 MG tablet Take 1 tablet (20 mg total) by mouth daily. 30 tablet 5  . potassium chloride SA (KLOR-CON) 20 MEQ tablet Take 1 tablet (20 mEq total) by mouth daily. 30 tablet 3  . PROAIR HFA 108 (90 Base) MCG/ACT inhaler Inhale 2 puffs into the lungs every 6 (six) hours as needed for wheezing or shortness of breath. 8.5 g 5  . thiamine 100 MG tablet Take 1 tablet (100 mg total) by mouth daily. 30 tablet 3  . traZODone (DESYREL) 50 MG tablet Take 1 tablet (50 mg total) by mouth at bedtime as needed for sleep.     No current facility-administered medications for this visit.     Allergies:   Patient has no known allergies.    Social History:  The patient  reports that she has never smoked. She has never used smokeless tobacco. She reports that she does not drink alcohol or use drugs.   Family History:  The patient's family history includes Diabetes in her sister.    ROS:  Please see the history of present illness.   Otherwise, review of systems are positive for pain with deep breathing.   All other systems are reviewed and negative.    PHYSICAL EXAM: VS:  BP (!) 144/72   Pulse (!) 101   Ht 5\' 7"  (1.702 m)   Wt 150 lb (68 kg)   SpO2 95%   BMI 23.49 kg/m  , BMI Body mass index is 23.49 kg/m. GEN: Well nourished, well developed, in no acute distress  HEENT: normal  Neck: no JVD, carotid bruits, or masses Cardiac:  RRR; no murmurs, rubs, or gallops,no edema  Respiratory:  clear to auscultation bilaterally, normal work of breathing GI: soft, nontender, nondistended, + BS MS: no deformity or atrophy  Skin: warm and dry, no rash Neuro:  Strength and sensation are intact Psych: euthymic mood, full affect   EKG:   The ekg ordered 11/9/2020demonstrates sinus tach, nonspecific ST changes   Recent Labs: 05/31/2019: BNP 244.4 07/14/2019: ALT 18 07/17/2019: Magnesium 1.3 07/21/2019: BUN <5; Creatinine, Ser 0.47; Hemoglobin 9.5; Platelets 865; Potassium 3.6; Sodium 132   Lipid Panel    Component Value Date/Time   CHOL 185 10/22/2018 0920   TRIG 129 10/22/2018 0920   HDL 81 10/22/2018 0920   CHOLHDL 2.3 10/22/2018 0920   CHOLHDL 2.5 05/20/2016 1452   VLDL 26 05/20/2016  [place] 10/22/2018 0920     Other studies Reviewed: Additional studies/ records that were reviewed today with results demonstrating: Chest CT showed: "Cardiovascular: There is mild cardiomegaly. No pericardial effusion. There is coronary vascular calcification primarily involving the LAD. Minimal atherosclerotic calcification of the aortic arch. No aneurysmal dilatation or dissection."   ASSESSMENT AND PLAN:  1. 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  No clear angina- pain seems more pleuritic.  She feels better as Hbg has improved.  COnsider CTA coronaries if EF does not improve.  2. Hypertensive heart disease: Keep BP < 140/90.   3. Chronic combined systolic/diastolic heart failure: She appears euvolemic today.  I suspect the mildly decreased EF was realted to severe anemia and strep pneumonia.  WOuld consider repeat echo in the future after issues have remained stable.  If EF back to normal, would not need further w/u.  If EF stayed low, would consider CTA coronaries.  4. Anemia:  Likely caused stress on her heart and may have decreased LVEF>  Hopefully, this will be better with better Hbg.     Current medicines are  reviewed at length with the patient today.  The patient concerns regarding her medicines were addressed.  The following changes have been made:  No change  Labs/ tests ordered today include:  No orders of the defined types were placed in this encounter.   Recommend 150 minutes/week of aerobic exercise Low fat, low carb, high fiber diet recommended  Disposition:   FU in 2 months, video visit ok   Signed, Larae Grooms, MD  08/05/2019 11:30 AM    [address], [place], [place]  [address] Phone: [phone]; Fax: [phone]

## 2019-08-05 NOTE — Patient Instructions (Addendum)
Medication Instructions:  Your physician recommends that you continue on your current medications as directed. Please refer to the Current Medication list given to you today.  If you need a refill on your cardiac medications before your next appointment, please call your pharmacy.   Lab work: None Ordered  If you have labs (blood work) drawn today and your tests are completely normal, you will receive your results only by: Marland Kitchen MyChart Message (if you have MyChart) OR . A paper copy in the mail If you have any lab test that is abnormal or we need to change your treatment, we will call you to review the results.  Testing/Procedures: None ordered  Follow-Up: . Follow up with Dr. Irish Lack via VIRTUAL Visit on 10/06/19 at 9:00 AM  Any Other Special Instructions Will Be Listed Below (If Applicable).

## 2019-08-09 ENCOUNTER — Other Ambulatory Visit: Payer: Self-pay | Admitting: Family Medicine

## 2019-08-09 DIAGNOSIS — M1710 Unilateral primary osteoarthritis, unspecified knee: Secondary | ICD-10-CM

## 2019-08-11 ENCOUNTER — Ambulatory Visit: Payer: Medicaid Other | Attending: Family Medicine

## 2019-08-11 ENCOUNTER — Other Ambulatory Visit: Payer: Self-pay

## 2019-08-11 DIAGNOSIS — I5042 Chronic combined systolic (congestive) and diastolic (congestive) heart failure: Secondary | ICD-10-CM | POA: Diagnosis not present

## 2019-08-11 DIAGNOSIS — D6489 Other specified anemias: Secondary | ICD-10-CM | POA: Diagnosis not present

## 2019-08-11 DIAGNOSIS — I110 Hypertensive heart disease with heart failure: Secondary | ICD-10-CM | POA: Diagnosis not present

## 2019-08-12 ENCOUNTER — Other Ambulatory Visit: Payer: Self-pay | Admitting: Family Medicine

## 2019-08-12 DIAGNOSIS — Z1211 Encounter for screening for malignant neoplasm of colon: Secondary | ICD-10-CM

## 2019-08-12 LAB — CBC WITH DIFFERENTIAL/PLATELET
Basophils Absolute: 0.1 10*3/uL (ref 0.0–0.2)
Basos: 1 %
EOS (ABSOLUTE): 0.2 10*3/uL (ref 0.0–0.4)
Eos: 3 %
Hematocrit: 32.6 % — ABNORMAL LOW (ref 34.0–46.6)
Hemoglobin: 9.9 g/dL — ABNORMAL LOW (ref 11.1–15.9)
Immature Grans (Abs): 0 10*3/uL (ref 0.0–0.1)
Immature Granulocytes: 0 %
Lymphocytes Absolute: 2.2 10*3/uL (ref 0.7–3.1)
Lymphs: 33 %
MCH: 24.8 pg — ABNORMAL LOW (ref 26.6–33.0)
MCHC: 30.4 g/dL — ABNORMAL LOW (ref 31.5–35.7)
MCV: 82 fL (ref 79–97)
Monocytes Absolute: 0.5 10*3/uL (ref 0.1–0.9)
Monocytes: 8 %
Neutrophils Absolute: 3.6 10*3/uL (ref 1.4–7.0)
Neutrophils: 55 %
Platelets: 425 10*3/uL (ref 150–450)
RBC: 4 x10E6/uL (ref 3.77–5.28)
RDW: 21.1 % — ABNORMAL HIGH (ref 11.7–15.4)
WBC: 6.6 10*3/uL (ref 3.4–10.8)

## 2019-08-12 LAB — BASIC METABOLIC PANEL
BUN/Creatinine Ratio: 15 (ref 12–28)
BUN: 9 mg/dL (ref 8–27)
CO2: 23 mmol/L (ref 20–29)
Calcium: 9.2 mg/dL (ref 8.7–10.3)
Chloride: 99 mmol/L (ref 96–106)
Creatinine, Ser: 0.59 mg/dL (ref 0.57–1.00)
GFR calc Af Amer: 114 mL/min/{1.73_m2} (ref >59)
GFR calc non Af Amer: 99 mL/min/{1.73_m2} (ref >59)
Glucose: 108 mg/dL — ABNORMAL HIGH (ref 65–99)
Potassium: 3.5 mmol/L (ref 3.5–5.2)
Sodium: 138 mmol/L (ref 134–144)

## 2019-08-12 MED ORDER — FERROUS SULFATE 325 (65 FE) MG PO TBEC: 325.0000 mg | DELAYED_RELEASE_TABLET | Freq: Two times a day (BID) | ORAL | 3 refills | Status: DC

## 2019-08-25 ENCOUNTER — Other Ambulatory Visit: Payer: Self-pay | Admitting: Family Medicine

## 2019-08-25 DIAGNOSIS — M1710 Unilateral primary osteoarthritis, unspecified knee: Secondary | ICD-10-CM

## 2019-08-31 ENCOUNTER — Ambulatory Visit: Payer: Medicaid Other | Attending: Family Medicine | Admitting: Family Medicine

## 2019-08-31 ENCOUNTER — Encounter: Payer: Self-pay | Admitting: Family Medicine

## 2019-08-31 ENCOUNTER — Other Ambulatory Visit: Payer: Self-pay

## 2019-08-31 VITALS — BP 162/82 | HR 96 | Temp 98.2°F | Ht 67.0 in | Wt 153.0 lb

## 2019-08-31 DIAGNOSIS — E7800 Pure hypercholesterolemia, unspecified: Secondary | ICD-10-CM | POA: Insufficient documentation

## 2019-08-31 DIAGNOSIS — Z79899 Other long term (current) drug therapy: Secondary | ICD-10-CM | POA: Diagnosis not present

## 2019-08-31 DIAGNOSIS — K219 Gastro-esophageal reflux disease without esophagitis: Secondary | ICD-10-CM | POA: Diagnosis not present

## 2019-08-31 DIAGNOSIS — Z791 Long term (current) use of non-steroidal anti-inflammatories (NSAID): Secondary | ICD-10-CM | POA: Insufficient documentation

## 2019-08-31 DIAGNOSIS — D6489 Other specified anemias: Secondary | ICD-10-CM | POA: Diagnosis not present

## 2019-08-31 DIAGNOSIS — E785 Hyperlipidemia, unspecified: Secondary | ICD-10-CM | POA: Insufficient documentation

## 2019-08-31 DIAGNOSIS — J45909 Unspecified asthma, uncomplicated: Secondary | ICD-10-CM | POA: Insufficient documentation

## 2019-08-31 DIAGNOSIS — I5042 Chronic combined systolic (congestive) and diastolic (congestive) heart failure: Secondary | ICD-10-CM | POA: Diagnosis not present

## 2019-08-31 DIAGNOSIS — Z7951 Long term (current) use of inhaled steroids: Secondary | ICD-10-CM | POA: Insufficient documentation

## 2019-08-31 DIAGNOSIS — D649 Anemia, unspecified: Secondary | ICD-10-CM | POA: Insufficient documentation

## 2019-08-31 DIAGNOSIS — I110 Hypertensive heart disease with heart failure: Secondary | ICD-10-CM | POA: Insufficient documentation

## 2019-08-31 MED ORDER — METOPROLOL SUCCINATE ER 25 MG PO TB24: 25.0000 mg | ORAL_TABLET | Freq: Every day | ORAL | 6 refills | Status: DC

## 2019-08-31 NOTE — Progress Notes (Signed)
Subjective:  Patient ID: Angel Hebert, female    DOB: [birth year]  Age: 63 y.o. MRN: [id number]  CC: Hypertension   HPI Angel Hebert is a 63 year old female with a history of hypertension, hyperlipidemia, asthma, allergic rhinitis, nasal polyps (Status post sinus surgery in 10/2016), GERD, bilateral knee osteoarthritis (status post left total knee arthroplasty in 09/2016) hospitalization last month for anemia, strep pneumo pneumonia and new diagnosis of CHF (EF 45 to 50%) During her hospital course she was transfused with 2 units of PRBC and commenced on Lasix for CHF and pneumonia was treated with IV antibiotics. Her hemoglobin post hospitalization was 9.9 after her visit with me 3 weeks ago. I had referred her to GI for Colonoscopy and was informed she would be scheduled next year by the GI office.  Seen by cardiology for follow-up visit 3 weeks ago. She complains of dyspnea on moderate exertion, no pedal edema, has 1 pillow orthopnea, gained 10 pounds since hospitalization last month.  Endorses compliance with her medications.  Past Medical History:  Diagnosis Date  . Arthritis   . Asthma    last year in December  . Dyspnea   . GERD (gastroesophageal reflux disease)   . High cholesterol   . Hypertension   . Pneumonia 01/2017    Past Surgical History:  Procedure Laterality Date  . ABDOMINAL HYSTERECTOMY    . nasal polyps     removed just this past tuesday at [facility] facility over by [facility]  . TOTAL KNEE ARTHROPLASTY Left 10/01/2016   Procedure: LEFT TOTAL KNEE ARTHROPLASTY;  Surgeon: Meredith Pel, MD;  Location: [facility];  Service: Orthopedics;  Laterality: Left;    Family History  Problem Relation Age of Onset  . Diabetes Sister   . Colon cancer Neg Hx   . Breast cancer Neg Hx     No Known Allergies  Outpatient Medications Prior to Visit  Medication Sig Dispense Refill  . albuterol (PROVENTIL) (2.5 MG/3ML) 0.083% nebulizer solution Take 3 mLs (2.5  mg total) by nebulization every 6 (six) hours as needed for wheezing or shortness of breath. 75 mL 3  . ALL DAY ALLERGY 10 MG tablet TAKE 1 TABLET BY MOUTH DAILY. 30 tablet 2  . atorvastatin (LIPITOR) 10 MG tablet Take 1 tablet (10 mg total) by mouth daily at 6 PM. 30 tablet 6  . ferrous sulfate 325 (65 FE) MG EC tablet Take 1 tablet (325 mg total) by mouth 2 (two) times daily. 60 tablet 3  . fluticasone (FLONASE) 50 MCG/ACT nasal spray Place 2 sprays into both nostrils daily. 16 g 6  . Fluticasone-Salmeterol (ADVAIR DISKUS) 500-50 MCG/DOSE AEPB INHALE 1 PUFF INTO THE LUNGS EVERY 12 (TWELVE) HOURS. 60 each 6  . furosemide (LASIX) 40 MG tablet Take 1 tablet (40 mg total) by mouth daily. 30 tablet 3  . losartan-hydrochlorothiazide (HYZAAR) 100-25 MG tablet Take 1 tablet by mouth daily. 90 tablet 1  . montelukast (SINGULAIR) 10 MG tablet Take 1 tablet (10 mg total) by mouth at bedtime. 30 tablet 6  . naproxen (NAPROSYN) 500 MG tablet TAKE 1 TABLET BY MOUTH 2 TIMES DAILY 30 tablet 0  . ondansetron (ZOFRAN) 4 MG tablet Take 1 tablet (4 mg total) by mouth every 6 (six) hours as needed for nausea. 20 tablet 0  . pantoprazole (PROTONIX) 20 MG tablet Take 1 tablet (20 mg total) by mouth daily. 30 tablet 5  . potassium chloride SA (KLOR-CON) 20 MEQ tablet Take 1 tablet (20 mEq  total) by mouth daily. 30 tablet 3  . PROAIR HFA 108 (90 Base) MCG/ACT inhaler Inhale 2 puffs into the lungs every 6 (six) hours as needed for wheezing or shortness of breath. 8.5 g 5  . traZODone (DESYREL) 50 MG tablet Take 1 tablet (50 mg total) by mouth at bedtime as needed for sleep.    . benzonatate (TESSALON) 200 MG capsule Take 1 capsule (200 mg total) by mouth 3 (three) times daily as needed for cough. (Patient not taking: Reported on 08/31/2019) 20 capsule 0  . lactulose (CHRONULAC) 10 GM/15ML solution Take 15 mLs (10 g total) by mouth 2 (two) times daily. (Patient not taking: Reported on 08/31/2019) 473 mL 0  . thiamine 100 MG  tablet Take 1 tablet (100 mg total) by mouth daily. (Patient not taking: Reported on 08/31/2019) 30 tablet 3   No facility-administered medications prior to visit.     ROS Review of Systems  Constitutional: Negative for activity change, appetite change and fatigue.  HENT: Negative for congestion, sinus pressure and sore throat.   Eyes: Negative for visual disturbance.  Respiratory: Positive for shortness of breath. Negative for cough, chest tightness and wheezing.   Cardiovascular: Negative for chest pain and palpitations.  Gastrointestinal: Negative for abdominal distention, abdominal pain and constipation.  Endocrine: Negative for polydipsia.  Genitourinary: Negative for dysuria and frequency.  Musculoskeletal: Negative for arthralgias and back pain.  Skin: Negative for rash.  Neurological: Negative for tremors, light-headedness and numbness.  Hematological: Does not bruise/bleed easily.  Psychiatric/Behavioral: Negative for agitation and behavioral problems.    Objective:  BP (!) 162/82   Pulse 96   Temp 98.2 F (36.8 C) (Oral)   Ht 5\' 7"  (1.702 m)   Wt 153 lb (69.4 kg)   SpO2 99%   BMI 23.96 kg/m   BP/Weight 08/31/2019 08/05/2019 [id number]  Systolic BP 0000000 [id number]  Diastolic BP 82 72 94  Wt. (Lbs) 153 150 143.74  BMI 23.96 23.49 22.51      Physical Exam Constitutional:      Appearance: She is well-developed.  Neck:     Vascular: No JVD.     Comments: No JVD Cardiovascular:     Rate and Rhythm: Normal rate.     Heart sounds: Normal heart sounds. No murmur.  Pulmonary:     Effort: Pulmonary effort is normal.     Breath sounds: Normal breath sounds. No wheezing or rales.  Chest:     Chest wall: No tenderness.  Abdominal:     General: Bowel sounds are normal. There is no distension.     Palpations: Abdomen is soft. There is no mass.     Tenderness: There is no abdominal tenderness.  Musculoskeletal:        General: Normal range of motion.     Right lower  leg: No edema.     Left lower leg: No edema.  Neurological:     Mental Status: She is alert and oriented to person, place, and time.  Psychiatric:        Mood and Affect: Mood normal.     CMP Latest Ref Rng & Units 08/11/2019 07/21/2019 07/20/2019  Glucose 65 - 99 mg/dL 108(H) 97 96  BUN 8 - 27 mg/dL 9 <5(L) <5(L)  Creatinine 0.57 - 1.00 mg/dL 0.59 0.47 0.50  Sodium 134 - 144 mmol/L 138 132(L) 133(L)  Potassium 3.5 - 5.2 mmol/L 3.5 3.6 3.3(L)  Chloride 96 - 106 mmol/L 99 96(L) 95(L)  CO2 20 -  29 mmol/L 23 25 26   Calcium 8.7 - 10.3 mg/dL 9.2 8.6(L) 8.3(L)  Total Protein 6.5 - 8.1 g/dL - - -  Total Bilirubin 0.3 - 1.2 mg/dL - - -  Alkaline Phos 38 - 126 U/L - - -  AST 15 - 41 U/L - - -  ALT 0 - 44 U/L - - -    Lipid Panel     Component Value Date/Time   CHOL 185 10/22/2018 0920   TRIG 129 10/22/2018 0920   HDL 81 10/22/2018 0920   CHOLHDL 2.3 10/22/2018 0920   CHOLHDL 2.5 05/20/2016 1452   VLDL 26 05/20/2016 1452   LDLCALC 78 10/22/2018 0920    CBC    Component Value Date/Time   WBC 6.6 08/11/2019 1045   WBC 9.6 07/21/2019 0252   RBC 4.00 08/11/2019 1045   RBC 3.92 07/21/2019 0252   HGB 9.9 (L) 08/11/2019 1045   HCT 32.6 (L) 08/11/2019 1045   PLT 425 08/11/2019 1045   MCV 82 08/11/2019 1045   MCH 24.8 (L) 08/11/2019 1045   MCH 24.2 (L) 07/21/2019 0252   MCHC 30.4 (L) 08/11/2019 1045   MCHC 30.5 07/21/2019 0252   RDW 21.1 (H) 08/11/2019 1045   LYMPHSABS 2.2 08/11/2019 1045   MONOABS 1.3 (H) 07/14/2019 0240   EOSABS 0.2 08/11/2019 1045   BASOSABS 0.1 08/11/2019 1045    Lab Results  Component Value Date   HGBA1C 5.3 10/26/2015    Assessment & Plan:   1. Hypertensive heart disease with chronic combined systolic and diastolic congestive heart failure ([place]) Newly diagnosed with EF of 45 to 50% Weight gain of 10 pounds in the last 1 month-consider increasing Lasix dose if dyspnea persists. Elevated blood pressure-commence Toprol-XL Continue Lasix,  losartan/HCTZ Counseled on cardiac diet Counseled on blood pressure goal of less than 130/80, low-sodium, DASH diet, medication compliance, 150 minutes of moderate intensity exercise per week. Discussed medication compliance, adverse effects. Keep appointment with cardiology - metoprolol succinate (TOPROL XL) 25 MG 24 hr tablet; Take 1 tablet (25 mg total) by mouth daily.  Dispense: 30 tablet; Refill: 6  2. Anemia due to other cause, not classified Last hemoglobin was 9.9, three weeks ago She will need a colonoscopy-awaiting appointment with GI - CBC with Differential/Platelet     Charlott Rakes, MD, FAAFP. [facility] and [place], [place]   08/31/2019, 10:11 AM

## 2019-08-31 NOTE — Patient Instructions (Signed)
Heart Failure, Diagnosis ° °Heart failure means that your heart is not able to pump blood in the right way. This makes it hard for your body to work well. Heart failure is usually a long-term (chronic) condition. You must take good care of yourself and follow your treatment plan from your doctor. °What are the causes? °This condition may be caused by: °· High blood pressure. °· Build up of cholesterol and fat in the arteries. °· Heart attack. This injures the heart muscle. °· Heart valves that do not open and close properly. °· Damage of the heart muscle. This is also called cardiomyopathy. °· Lung disease. °· Abnormal heart rhythms. °What increases the risk? °The risk of heart failure goes up as a person ages. This condition is also more likely to develop in people who: °· Are overweight. °· Are female. °· Smoke or chew tobacco. °· Abuse alcohol or illegal drugs. °· Have taken medicines that can damage the heart. °· Have diabetes. °· Have abnormal heart rhythms. °· Have thyroid problems. °· Have low blood counts (anemia). °What are the signs or symptoms? °Symptoms of this condition include: °· Shortness of breath. °· Coughing. °· Swelling of the feet, ankles, legs, or belly. °· Losing weight for no reason. °· Trouble breathing. °· Waking from sleep because of the need to sit up and get more air. °· Rapid heartbeat. °· Being very tired. °· Feeling dizzy, or feeling like you may pass out (faint). °· Having no desire to eat. °· Feeling like you may vomit (nauseous). °· Peeing (urinating) more at night. °· Feeling confused. °How is this treated? ° °  ° °This condition may be treated with: °· Medicines. These can be given to treat blood pressure and to make the heart muscles stronger. °· Changes in your daily life. These may include eating a healthy diet, staying at a healthy body weight, quitting tobacco and illegal drug use, or doing exercises. °· Surgery. Surgery can be done to open blocked valves, or to put devices in  the heart, such as pacemakers. °· A donor heart (heart transplant). You will receive a healthy heart from a donor. °Follow these instructions at home: °· Treat other conditions as told by your doctor. These may include high blood pressure, diabetes, thyroid disease, or abnormal heart rhythms. °· Learn as much as you can about heart failure. °· Get support as you need it. °· Keep all follow-up visits as told by your doctor. This is important. °Summary °· Heart failure means that your heart is not able to pump blood in the right way. °· This condition is caused by high blood pressure, heart attack, or damage of the heart muscle. °· Symptoms of this condition include shortness of breath and swelling of the feet, ankles, legs, or belly. You may also feel very tired or feel like you may vomit. °· You may be treated with medicines, surgery, or changes in your daily life. °· Treat other health conditions as told by your doctor. °This information is not intended to replace advice given to you by your health care provider. Make sure you discuss any questions you have with your health care provider. °Document Released: 05/28/2008 Document Revised: 11/06/2018 Document Reviewed: 11/06/2018 °Elsevier Patient Education © 2020 [employer] ° °

## 2019-09-01 LAB — CBC WITH DIFFERENTIAL/PLATELET
Basophils Absolute: 0.1 10*3/uL (ref 0.0–0.2)
Basos: 1 %
EOS (ABSOLUTE): 0.2 10*3/uL (ref 0.0–0.4)
Eos: 3 %
Hematocrit: 35.9 % (ref 34.0–46.6)
Hemoglobin: 11.1 g/dL (ref 11.1–15.9)
Immature Grans (Abs): 0 10*3/uL (ref 0.0–0.1)
Immature Granulocytes: 0 %
Lymphocytes Absolute: 1.5 10*3/uL (ref 0.7–3.1)
Lymphs: 22 %
MCH: 25.3 pg — ABNORMAL LOW (ref 26.6–33.0)
MCHC: 30.9 g/dL — ABNORMAL LOW (ref 31.5–35.7)
MCV: 82 fL (ref 79–97)
Monocytes Absolute: 0.6 10*3/uL (ref 0.1–0.9)
Monocytes: 9 %
Neutrophils Absolute: 4.4 10*3/uL (ref 1.4–7.0)
Neutrophils: 65 %
Platelets: 414 10*3/uL (ref 150–450)
RBC: 4.38 x10E6/uL (ref 3.77–5.28)
RDW: 22.2 % — ABNORMAL HIGH (ref 11.7–15.4)
WBC: 6.7 10*3/uL (ref 3.4–10.8)

## 2019-09-02 ENCOUNTER — Telehealth: Payer: Self-pay

## 2019-09-02 NOTE — Telephone Encounter (Signed)
-----   Message from Charlott Rakes, MD sent at 09/01/2019 10:16 AM EST ----- Please inform her anemia has resolved and hemoglobin is back to normal

## 2019-09-02 NOTE — Telephone Encounter (Signed)
Patient was called and there is no voicemail set up to leave a message. 

## 2019-09-16 ENCOUNTER — Ambulatory Visit (HOSPITAL_COMMUNITY)
Admission: EM | Admit: 2019-09-16 | Discharge: 2019-09-16 | Disposition: A | Discharge status: Home / Self Care | Payer: Medicaid Other | Attending: Family Medicine | Admitting: Family Medicine

## 2019-09-16 ENCOUNTER — Encounter (HOSPITAL_COMMUNITY): Payer: Self-pay

## 2019-09-16 ENCOUNTER — Ambulatory Visit (INDEPENDENT_AMBULATORY_CARE_PROVIDER_SITE_OTHER): Payer: Medicaid Other

## 2019-09-16 ENCOUNTER — Other Ambulatory Visit: Payer: Self-pay

## 2019-09-16 DIAGNOSIS — S92344A Nondisplaced fracture of fourth metatarsal bone, right foot, initial encounter for closed fracture: Secondary | ICD-10-CM

## 2019-09-16 DIAGNOSIS — S92321A Displaced fracture of second metatarsal bone, right foot, initial encounter for closed fracture: Secondary | ICD-10-CM

## 2019-09-16 DIAGNOSIS — S92341A Displaced fracture of fourth metatarsal bone, right foot, initial encounter for closed fracture: Secondary | ICD-10-CM | POA: Diagnosis not present

## 2019-09-16 DIAGNOSIS — S92334A Nondisplaced fracture of third metatarsal bone, right foot, initial encounter for closed fracture: Secondary | ICD-10-CM

## 2019-09-16 DIAGNOSIS — S92331A Displaced fracture of third metatarsal bone, right foot, initial encounter for closed fracture: Secondary | ICD-10-CM | POA: Diagnosis not present

## 2019-09-16 MED ORDER — HYDROCODONE-ACETAMINOPHEN 5-325 MG PO TABS: 1.0000 | ORAL_TABLET | Freq: Four times a day (QID) | ORAL | 0 refills | Status: DC | PRN

## 2019-09-16 NOTE — ED Provider Notes (Signed)
[facility]    CSN: [id number] Arrival date & time: 09/16/19  1017      History   Chief Complaint Chief Complaint  Patient presents with  . Foot Pain    HPI Angel Hebert is a 64 y.o. female.   HPI   Patient is here for foot pain.  She has pain in her right foot.  She is not an accurate historian.  She told me that its been there for 3 days, 5 days, 7 days, and 3 weeks with questioning.  She states that a table fell about 3 weeks ago.  She states is been hurting ever since then.  Then she told me that her boyfriend said it has only been 7 days.  She told the nurse 5 days. She states she has never had gout.  She was placed on Lasix during her recent hospitalization in November 2020.  She was identified as having congestive heart failure.  The pain is across the top of her foot.  She does have a bunion but states that this does not hurt.  She has swelling all the way up to the ankle.  Increased pain with ambulation and weightbearing. Blood pressure is mildly elevated at 147/92.  She states that she is compliant with her blood pressure medication.    Past Medical History:  Diagnosis Date  . Arthritis   . Asthma    last year in December  . Dyspnea   . GERD (gastroesophageal reflux disease)   . High cholesterol   . Hypertension   . Pneumonia 01/2017    Patient Active Problem List   Diagnosis Date Noted  . Symptomatic anemia 07/12/2019  . Lobar pneumonia ([place]) 07/12/2019  . Insomnia 03/03/2017  . 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 Arthritis of knee 10/01/2016  . Physical deconditioning   . Anemia, iron deficiency   . Pressure ulcer 10/14/2015  . CAP (community acquired pneumonia)   . Sepsis due to Streptococcus pneumoniae ([place])   . Hyperlipidemia 03/02/2007  . Essential hypertension 03/02/2007  . Allergic rhinitis 03/02/2007  . Asthma 03/02/2007  . GERD 03/02/2007    Past Surgical History:  Procedure Laterality Date  .  ABDOMINAL HYSTERECTOMY    . nasal polyps     removed just this past tuesday at [place] facility over by [facility]  . TOTAL KNEE ARTHROPLASTY Left 10/01/2016   Procedure: LEFT TOTAL KNEE ARTHROPLASTY;  Surgeon: Meredith Pel, MD;  Location: [place];  Service: Orthopedics;  Laterality: Left;    OB History   No obstetric history on file.      Home Medications    Prior to Admission medications   Medication Sig Start Date End Date Taking? Authorizing Provider  albuterol (PROVENTIL) (2.5 MG/3ML) 0.083% nebulizer solution Take 3 mLs (2.5 mg total) by nebulization every 6 (six) hours as needed for wheezing or shortness of breath. 10/19/18   Charlott Rakes, MD  ALL DAY ALLERGY 10 MG tablet TAKE 1 TABLET BY MOUTH DAILY. 01/28/17   Charlott Rakes, MD  atorvastatin (LIPITOR) 10 MG tablet Take 1 tablet (10 mg total) by mouth daily at 6 PM. 05/31/19   Charlott Rakes, MD  ferrous sulfate 325 (65 FE) MG EC tablet Take 1 tablet (325 mg total) by mouth 2 (two) times daily. 08/12/19   Charlott Rakes, MD  fluticasone (FLONASE) 50 MCG/ACT nasal spray Place 2 sprays into both nostrils daily. 05/31/19   Charlott Rakes, MD  Fluticasone-Salmeterol (ADVAIR DISKUS) 500-50 MCG/DOSE  AEPB INHALE 1 PUFF INTO THE LUNGS EVERY 12 (TWELVE) HOURS. 05/31/19   Charlott Rakes, MD  furosemide (LASIX) 40 MG tablet Take 1 tablet (40 mg total) by mouth daily. 08/03/19 09/02/19  Charlott Rakes, MD  HYDROcodone-acetaminophen (NORCO/VICODIN) 5-325 MG tablet Take 1-2 tablets by mouth every 6 (six) hours as needed. 09/16/19   Raylene Everts, MD  losartan-hydrochlorothiazide (HYZAAR) 100-25 MG tablet Take 1 tablet by mouth daily. 05/31/19   Charlott Rakes, MD  metoprolol succinate (TOPROL XL) 25 MG 24 hr tablet Take 1 tablet (25 mg total) by mouth daily. 08/31/19   Charlott Rakes, MD  montelukast (SINGULAIR) 10 MG tablet Take 1 tablet (10 mg total) by mouth at bedtime. 05/31/19   Charlott Rakes, MD  naproxen (NAPROSYN) 500 MG  tablet TAKE 1 TABLET BY MOUTH 2 TIMES DAILY 08/25/19   Charlott Rakes, MD  ondansetron (ZOFRAN) 4 MG tablet Take 1 tablet (4 mg total) by mouth every 6 (six) hours as needed for nausea. 07/21/19   Aline August, MD  pantoprazole (PROTONIX) 20 MG tablet Take 1 tablet (20 mg total) by mouth daily. 05/31/19   Charlott Rakes, MD  potassium chloride SA (KLOR-CON) 20 MEQ tablet Take 1 tablet (20 mEq total) by mouth daily. 08/03/19   Charlott Rakes, MD  PROAIR HFA 108 (90 Base) MCG/ACT inhaler Inhale 2 puffs into the lungs every 6 (six) hours as needed for wheezing or shortness of breath. 05/31/19   Charlott Rakes, MD  traZODone (DESYREL) 50 MG tablet Take 1 tablet (50 mg total) by mouth at bedtime as needed for sleep. 07/21/19 09/16/19  Aline August, MD    Family History Family History  Problem Relation Age of Onset  . Diabetes Sister   . Healthy Mother   . Healthy Father   . Colon cancer Neg Hx   . Breast cancer Neg Hx     Social History Social History   Tobacco Use  . Smoking status: Never Smoker  . Smokeless tobacco: Never Used  Substance Use Topics  . Alcohol use: Yes    Alcohol/week: 1.0 standard drinks    Types: 1 Shots of liquor per week    Comment: socially  . Drug use: No     Allergies   Patient has no known allergies.   Review of Systems Review of Systems  Constitutional: Negative for chills and fever.  HENT: Negative for congestion and hearing loss.   Eyes: Negative for pain.  Respiratory: Negative for cough and shortness of breath.   Cardiovascular: Positive for leg swelling. Negative for chest pain.  Gastrointestinal: Negative for abdominal pain, constipation and diarrhea.  Genitourinary: Negative for dysuria and frequency.  Musculoskeletal: Positive for gait problem. Negative for myalgias.  Neurological: Negative for dizziness, seizures and headaches.     Physical Exam Triage Vital Signs ED Triage Vitals  Enc Vitals Group     BP 09/16/19 1048 (!)  147/92     Pulse Rate 09/16/19 1048 91     Resp 09/16/19 1048 18     Temp 09/16/19 1048 97.9 F (36.6 C)     Temp Source 09/16/19 1048 Oral     SpO2 09/16/19 1048 99 %     Weight --      Height --      Head Circumference --      Peak Flow --      Pain Score 09/16/19 1046 10     Pain Loc --      Pain Edu? --  Excl. in GC? --    No data found.  Updated Vital Signs BP (!) 147/92 (BP Location: Left Arm)   Pulse 91   Temp 97.9 F (36.6 C) (Oral)   Resp 18   SpO2 99%     Physical Exam Constitutional:      General: She is not in acute distress.    Appearance: She is well-developed.  HENT:     Head: Normocephalic and atraumatic.     Mouth/Throat:     Comments: Mask in place Eyes:     Conjunctiva/sclera: Conjunctivae normal.     Pupils: Pupils are equal, round, and reactive to light.  Cardiovascular:     Rate and Rhythm: Normal rate.  Pulmonary:     Effort: Pulmonary effort is normal. No respiratory distress.  Abdominal:     General: There is no distension.     Palpations: Abdomen is soft.  Musculoskeletal:        General: Normal range of motion.     Cervical back: Normal range of motion.     Comments: Right foot has hallux valgus.  There is edema to the ankle.  Acute tenderness over distal second third and fourth metatarsals, dorsum  Skin:    General: Skin is warm and dry.  Neurological:     Mental Status: She is alert.      UC Treatments / Results  Labs (all labs ordered are listed, but only abnormal results are displayed) Labs Reviewed - No data to display  EKG   Radiology DG Foot Complete Right  Result Date: 09/16/2019 CLINICAL DATA:  Pain process days EXAM: RIGHT FOOT COMPLETE - 3+ VIEW COMPARISON:  None. FINDINGS: Frontal, oblique, and lateral views were obtained. There are fractures of the distal fifth second, third, and fourth metatarsals with mild impaction at each fracture site. No other fractures are appreciable. No dislocation. Bones are  diffusely osteoporotic. There is marked hallux valgus deformity at the first MTP joint with joint space narrowing at the first MTP joint. There is narrowing of all PIP joints. There is spurring in the dorsal midfoot. There are small posterior and inferior calcaneal spurs. IMPRESSION: 1. Impacted fractures of the distal second, third, and fourth metatarsals. No dislocation. No other fractures evident. 2.  Bones diffusely osteoporotic. 3.  Marked hallux valgus deformity at the first MTP joint. 4. Osteoarthritic change in several distal joints as well as in the first MTP joint and dorsal midfoot. There are calcaneal spurs. These results will be called to the ordering clinician or representative by the Radiologist Assistant, and communication documented in the PACS or zVision Dashboard. Electronically Signed   By: Lowella Grip III M.D.   On: 09/16/2019 11:14    Procedures Procedures (including critical care time)  Medications Ordered in UC Medications - No data to display  Initial Impression / Assessment and Plan / UC Course  I have reviewed the triage vital signs and the nursing notes.  Pertinent labs & imaging results that were available during my care of the patient were reviewed by me and considered in my medical decision making (see chart for details).    Spoke to Dr Erlinda Hong of [facility].  He assisted with discharge information and follow up Final Clinical Impressions(s) / UC Diagnoses   Final diagnoses:  Closed displaced fracture of second metatarsal bone of right foot, initial encounter  Closed nondisplaced fracture of third metatarsal bone of right foot, initial encounter  Closed nondisplaced fracture of fourth metatarsal bone of right  foot, initial encounter     Discharge Instructions     Limit walking as much as you can Wear fracture shoe at all times Elevate foot to reduce swelling Take pain medicine as needed- caution drowsiness CALL TODAY to Dr Forbes Cellar office for a follow  up appointment    ED Prescriptions    Medication Sig Dispense Auth. Provider   HYDROcodone-acetaminophen (NORCO/VICODIN) 5-325 MG tablet Take 1-2 tablets by mouth every 6 (six) hours as needed. 15 tablet Raylene Everts, MD     I have reviewed the PDMP during this encounter.   Raylene Everts, MD 09/16/19 1214

## 2019-09-16 NOTE — ED Triage Notes (Signed)
Patient presents to Urgent Care with complaints of right foot pain since 5 days ago. Patient reports no known injury, endorses swelling as well, pain is worse w/ ambulation and movement, pt denies hx of gout.

## 2019-09-16 NOTE — Discharge Instructions (Addendum)
Limit walking as much as you can Wear fracture shoe at all times Elevate foot to reduce swelling Take pain medicine as needed- caution drowsiness CALL TODAY to Dr Forbes Cellar office for a follow up appointment

## 2019-09-27 ENCOUNTER — Ambulatory Visit (INDEPENDENT_AMBULATORY_CARE_PROVIDER_SITE_OTHER): Payer: Medicaid Other | Admitting: Orthopedic Surgery

## 2019-09-27 ENCOUNTER — Other Ambulatory Visit: Payer: Self-pay

## 2019-09-27 DIAGNOSIS — S92331A Displaced fracture of third metatarsal bone, right foot, initial encounter for closed fracture: Secondary | ICD-10-CM

## 2019-09-27 DIAGNOSIS — S92341A Displaced fracture of fourth metatarsal bone, right foot, initial encounter for closed fracture: Secondary | ICD-10-CM | POA: Diagnosis not present

## 2019-09-27 DIAGNOSIS — S92321A Displaced fracture of second metatarsal bone, right foot, initial encounter for closed fracture: Secondary | ICD-10-CM

## 2019-09-28 ENCOUNTER — Encounter: Payer: Self-pay | Admitting: Orthopedic Surgery
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
## 2019-09-29 ENCOUNTER — Telehealth: Payer: Self-pay | Admitting: Orthopedic Surgery

## 2019-09-29 NOTE — Telephone Encounter (Signed)
Patient called requesting hydrocodone pain medication. Patient states to be in sever pain. Patient pharmacy [facility] on [address] Patient phone number is [phone].

## 2019-09-30 ENCOUNTER — Other Ambulatory Visit: Payer: Self-pay | Admitting: Surgical

## 2019-09-30 MED ORDER — HYDROCODONE-ACETAMINOPHEN 5-325 MG PO TABS: 1.0000 | ORAL_TABLET | Freq: Three times a day (TID) | ORAL | 0 refills | Status: DC | PRN

## 2019-09-30 NOTE — Telephone Encounter (Signed)
Pls advise. Thanks.  

## 2019-10-04 NOTE — Progress Notes (Signed)
Virtual Visit via Telephone Note   This visit type was conducted due to national recommendations for restrictions regarding the COVID-19 Pandemic (e.g. social distancing) in an effort to limit this patient's exposure and mitigate transmission in our community.  Due to her co-morbid illnesses, this patient is at least at moderate risk for complications without adequate follow up.  This format is felt to be most appropriate for this patient at this time.  The patient did not have access to video technology/had technical difficulties with video requiring transitioning to audio format only (telephone).  All issues noted in this document were discussed and addressed.  No physical exam could be performed with this format.  Please refer to the patient's chart for her  consent to telehealth for [facility].   Date:  10/06/2019   ID:  [name], DOB Jan 05, 1956, MRN [id number]  Patient Location: Home Provider Location: Office  PCP:  Charlott Rakes, MD  Cardiologist:  No primary care provider on file. [place] Electrophysiologist:  None   Evaluation Performed:  Follow-Up Visit  Chief Complaint:  Coronary calcification  History of Present Illness:    Nalleli States is a 64 y.o. female with Who has had hypertensive heart disease.   She was in the hospital in 07/2019: "Strep pneumoniaebacteremia and multifocal pneumonia: Present on admission Sepsis: Present on admission. Resolved -Patient was initially started on broad-spectrum antibiotics and subsequently switched to Rocephin. ID was consulted and antibiotics was switched to penicillin G IV, patient is currently on penicillin G. -Repeat blood cultures from 07/15/2019 have been negative so far -TTE did not show any vegetations -Currently afebrile and hemodynamically stable and on room air. Will discharge on amoxicillin for 5 more days as per ID recommendations  Symptomatic anemia -Unknown baseline. Presented with  hemoglobin of 5.2 with no overt GI bleed. Status post 2 units packed red cells transfusion during this admission. Hemoglobin has stayed stable subsequently. Hemoglobin 9.5 today. Might need outpatient GI evaluation  Acute systolic heart failure -EF of 45 to 50% on recent TTE. Currently on intravenous Lasix. Will discharge on Lasix 40 mg daily. Outpatient follow-up with cardiology.  Acute metabolic encephalopathy causing confusion -CT head was unremarkable for acute process but significant for small vessel white matter disease and extensive periventricular/deep white matter hypodensity. -Unclear etiology. Unclear if this is secondary to alcohol withdrawal. Continue thiamine. -Ammonia level elevated at 62 and right upper quadrant ultrasound was unremarkable for liver pathology. Continue lactulose -Mental status waxing and waning but currently stable. Might have underlying cognitive disease. Might need outpatient neurology evaluation."  No appt with GI is set up.  She feels better.  Has some pleuritic pain when she takes a deep breath- improving with time.  Felt worse when she had pneumonia.    The patient does not have symptoms concerning for COVID-19 infection (fever, chills, cough, or new shortness of breath).    Past Medical History:  Diagnosis Date  . Arthritis   . Asthma    last year in December  . Dyspnea   . GERD (gastroesophageal reflux disease)   . High cholesterol   . Hypertension   . Pneumonia 01/2017   Past Surgical History:  Procedure Laterality Date  . ABDOMINAL HYSTERECTOMY    . nasal polyps     removed just this past tuesday at [place] facility over by [facility]  . TOTAL KNEE ARTHROPLASTY Left 10/01/2016   Procedure: LEFT TOTAL KNEE ARTHROPLASTY;  Surgeon: Meredith Pel, MD;  Location: [facility];  Service: Orthopedics;  Laterality: Left;     Current Meds  Medication Sig  . albuterol (PROVENTIL) (2.5 MG/3ML) 0.083% nebulizer solution Take 3 mLs  (2.5 mg total) by nebulization every 6 (six) hours as needed for wheezing or shortness of breath.  . ALL DAY ALLERGY 10 MG tablet TAKE 1 TABLET BY MOUTH DAILY.  Marland Kitchen atorvastatin (LIPITOR) 10 MG tablet Take 1 tablet (10 mg total) by mouth daily at 6 PM.  . ferrous sulfate 325 (65 FE) MG EC tablet Take 1 tablet (325 mg total) by mouth 2 (two) times daily.  . fluticasone (FLONASE) 50 MCG/ACT nasal spray Place 2 sprays into both nostrils daily.  . Fluticasone-Salmeterol (ADVAIR DISKUS) 500-50 MCG/DOSE AEPB INHALE 1 PUFF INTO THE LUNGS EVERY 12 (TWELVE) HOURS.  . furosemide (LASIX) 40 MG tablet Take 1 tablet (40 mg total) by mouth daily.  Marland Kitchen HYDROcodone-acetaminophen (NORCO/VICODIN) 5-325 MG tablet Take 1 tablet by mouth every 8 (eight) hours as needed.  Marland Kitchen losartan-hydrochlorothiazide (HYZAAR) 100-25 MG tablet Take 1 tablet by mouth daily.  . metoprolol succinate (TOPROL XL) 25 MG 24 hr tablet Take 1 tablet (25 mg total) by mouth daily.  . montelukast (SINGULAIR) 10 MG tablet Take 1 tablet (10 mg total) by mouth at bedtime.  . naproxen (NAPROSYN) 500 MG tablet TAKE 1 TABLET BY MOUTH 2 TIMES DAILY  . ondansetron (ZOFRAN) 4 MG tablet Take 1 tablet (4 mg total) by mouth every 6 (six) hours as needed for nausea.  . pantoprazole (PROTONIX) 20 MG tablet Take 1 tablet (20 mg total) by mouth daily.  . potassium chloride SA (KLOR-CON) 20 MEQ tablet Take 1 tablet (20 mEq total) by mouth daily.  Marland Kitchen PROAIR HFA 108 (90 Base) MCG/ACT inhaler Inhale 2 puffs into the lungs every 6 (six) hours as needed for wheezing or shortness of breath.     Allergies:   Patient has no known allergies.   Social History   Tobacco Use  . Smoking status: Never Smoker  . Smokeless tobacco: Never Used  Substance Use Topics  . Alcohol use: Yes    Alcohol/week: 1.0 standard drinks    Types: 1 Shots of liquor per week    Comment: socially  . Drug use: No     Family Hx: The patient's family history includes Diabetes in her sister;  Healthy in her father and mother. There is no history of Colon cancer or Breast cancer.  ROS:   Please see the history of present illness.    Broken toes on right foot- limits walking All other systems reviewed and are negative.   Prior CV studies:   The following studies were reviewed today: Prior echo reviewed   Labs/Other Tests and Data Reviewed:    EKG:  An ECG dated 07/2019 was personally reviewed today and demonstrated:  sinus tach, nonspecific ST changes  Recent Labs: 05/31/2019: BNP 244.4 07/14/2019: ALT 18 07/17/2019: Magnesium 1.3 08/11/2019: BUN 9; Creatinine, Ser 0.59; Potassium 3.5; Sodium 138 08/31/2019: Hemoglobin 11.1; Platelets 414   Recent Lipid Panel Lab Results  Component Value Date/Time   CHOL 185 10/22/2018 09:20 AM   TRIG 129 10/22/2018 09:20 AM   HDL 81 10/22/2018 09:20 AM   CHOLHDL 2.3 10/22/2018 09:20 AM   CHOLHDL 2.5 05/20/2016 02:52 PM   LDLCALC 78 10/22/2018 09:20 AM    Wt Readings from Last 3 Encounters:  10/06/19 150 lb (68 kg)  08/31/19 153 lb (69.4 kg)  08/05/19 150 lb (68 kg)     Objective:  Vital Signs:  Ht 5\' 7"  (1.702 m)   Wt 150 lb (68 kg)   BMI 23.49 kg/m    VITAL SIGNS:  reviewed GEN:  no acute distress RESPIRATORY:  no shortness of breath NEURO:  alert and oriented x 3, no obvious focal deficit PSYCH:  normal affect exam limited by phone format  ASSESSMENT & PLAN:    1. 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  If Ef stayed low, would consider CTA coronaries.  No sx since Hbg has increased.  We discussed repeat echo but since she is feeling well, will not pursue at this time. No angina with walking.  Healthy diet recommended.  Avoid processed foods. 2. Hypertensive heart disease: Followed with PMD.  Controlled per her report. 3. Chronic combined systolic/diastolic heart failure: EF 45% in 07/2019.  Abnormal echo. 4. Anemia: Stable on 12/29 11.1.  COVID-19 Education: The signs and symptoms of COVID-19 were discussed  with the patient and how to seek care for testing (follow up with PCP or arrange E-visit).  The importance of social distancing was discussed today.  Time:   Today, I have spent 20 minutes with the patient with telehealth technology discussing the above problems.     Medication Adjustments/Labs and Tests Ordered: Current medicines are reviewed at length with the patient today.  Concerns regarding medicines are outlined above.   Tests Ordered: No orders of the defined types were placed in this encounter.   Medication Changes: No orders of the defined types were placed in this encounter.   Follow Up:  Either In Person or Virtual in 1 year(s)  Signed, Larae Grooms, MD  10/06/2019 9:14 AM    [place] [place]

## 2019-10-06 ENCOUNTER — Other Ambulatory Visit: Payer: Self-pay

## 2019-10-06 ENCOUNTER — Telehealth (INDEPENDENT_AMBULATORY_CARE_PROVIDER_SITE_OTHER): Payer: Medicaid Other | Admitting: Interventional Cardiology

## 2019-10-06 ENCOUNTER — Encounter: Payer: Self-pay | Admitting: Interventional Cardiology

## 2019-10-06 VITALS — Ht 67.0 in | Wt 150.0 lb

## 2019-10-06 DIAGNOSIS — R931 Abnormal findings on diagnostic imaging of heart and coronary circulation: Secondary | ICD-10-CM | POA: Diagnosis not present

## 2019-10-06 DIAGNOSIS — I2510 Atherosclerotic heart disease of native coronary artery without angina pectoris: Secondary | ICD-10-CM | POA: Diagnosis not present

## 2019-10-06 DIAGNOSIS — I10 Essential (primary) hypertension: Secondary | ICD-10-CM

## 2019-10-06 DIAGNOSIS — I2584 Coronary atherosclerosis due to calcified coronary lesion: Secondary | ICD-10-CM | POA: Diagnosis not present

## 2019-10-06 NOTE — Patient Instructions (Signed)
Medication Instructions:  Your physician recommends that you continue on your current medications as directed. Please refer to the Current Medication list given to you today.  *If you need a refill on your cardiac medications before your next appointment, please call your pharmacy*  Lab Work: None ordered  If you have labs (blood work) drawn today and your tests are completely normal, you will receive your results only by: . MyChart Message (if you have MyChart) OR . A paper copy in the mail If you have any lab test that is abnormal or we need to change your treatment, we will call you to review the results.  Testing/Procedures: None ordered  Follow-Up: At [facility], you and your health needs are our priority.  As part of our continuing mission to provide you with exceptional heart care, we have created designated Provider Care Teams.  These Care Teams include your primary Cardiologist (physician) and Advanced Practice Providers (APPs -  Physician Assistants and Nurse Practitioners) who all work together to provide you with the care you need, when you need it.  Your next appointment:   12 months  The format for your next appointment:   In Person  Provider:   You may see Jay Varanasi, MD or one of the following Advanced Practice Providers on your designated Care Team:    Dayna Dunn, PA-C  Michele Lenze, PA-C   Other Instructions    

## 2019-10-13 ENCOUNTER — Ambulatory Visit (INDEPENDENT_AMBULATORY_CARE_PROVIDER_SITE_OTHER): Payer: Medicaid Other

## 2019-10-13 ENCOUNTER — Encounter: Payer: Self-pay | Admitting: Orthopedic Surgery

## 2019-10-13 ENCOUNTER — Other Ambulatory Visit: Payer: Self-pay

## 2019-10-13 ENCOUNTER — Ambulatory Visit (INDEPENDENT_AMBULATORY_CARE_PROVIDER_SITE_OTHER): Payer: Medicaid Other | Admitting: Orthopedic Surgery

## 2019-10-13 DIAGNOSIS — S92321A Displaced fracture of second metatarsal bone, right foot, initial encounter for closed fracture: Secondary | ICD-10-CM

## 2019-10-13 MED ORDER — HYDROCODONE-ACETAMINOPHEN 5-325 MG PO TABS: ORAL_TABLET | ORAL | 0 refills | Status: DC

## 2019-10-13 NOTE — Progress Notes (Signed)
Post-Op Visit Note   Patient: Angel Hebert           Date of Birth: [birth year]           MRN: [id number] Visit Date: 10/13/2019 PCP: Charlott Rakes, MD   Assessment & Plan:  Chief Complaint:  Chief Complaint  Patient presents with  . Right Foot - Follow-up   Visit Diagnoses:  1. Closed displaced fracture of second metatarsal bone of right foot, initial encounter     Plan: [name] is a patient with right foot second metatarsal fracture third metatarsal fracture fourth metatarsal fracture.  She is about a month out from injury.  On exam she has minimal tenderness to palpation of the midfoot region.  Slight pain with weightbearing.  Overall it is improved.  No lesser toe deformity present.  Plan is refill Norco No. 10 tablets with no further refill and follow-up as needed.  Okay to go into regular shoes on Monday.  No calf tenderness today.  Negative Homans' sign and.  Follow-Up Instructions: Return if symptoms worsen or fail to improve.   Orders:  Orders Placed This Encounter  Procedures  . XR Foot Complete Right   Meds ordered this encounter  Medications  . HYDROcodone-acetaminophen (NORCO/VICODIN) 5-325 MG tablet    Sig: 1 po q hs prn    Dispense:  10 tablet    Refill:  0    Imaging: XR Foot Complete Right  Result Date: 10/13/2019 AP lateral oblique right foot reviewed.  Metatarsal neck fractures 234 in good position alignment with callus formation present.  No change in alignment in the tarsometatarsal joint.  Mild degenerative changes noted in the midfoot.   PMFS History: Patient Active Problem List   Diagnosis Date Noted  . Symptomatic anemia 07/12/2019  . Lobar pneumonia ([place]) 07/12/2019  . Insomnia 03/03/2017  . 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 Arthritis of knee 10/01/2016  . Physical deconditioning   . Anemia, iron deficiency   . Pressure ulcer 10/14/2015  . CAP (community acquired pneumonia)   . Sepsis due to Streptococcus  pneumoniae ([place])   . Hyperlipidemia 03/02/2007  . Essential hypertension 03/02/2007  . Allergic rhinitis 03/02/2007  . Asthma 03/02/2007  . GERD 03/02/2007   Past Medical History:  Diagnosis Date  . Arthritis   . Asthma    last year in December  . Dyspnea   . GERD (gastroesophageal reflux disease)   . High cholesterol   . Hypertension   . Pneumonia 01/2017    Family History  Problem Relation Age of Onset  . Diabetes Sister   . Healthy Mother   . Healthy Father   . Colon cancer Neg Hx   . Breast cancer Neg Hx     Past Surgical History:  Procedure Laterality Date  . ABDOMINAL HYSTERECTOMY    . nasal polyps     removed just this past tuesday at [place] facility over by [facility]  . TOTAL KNEE ARTHROPLASTY Left 10/01/2016   Procedure: LEFT TOTAL KNEE ARTHROPLASTY;  Surgeon: Meredith Pel, MD;  Location: [place];  Service: Orthopedics;  Laterality: Left;   Social History   Occupational History  . Not on file  Tobacco Use  . Smoking status: Never Smoker  . Smokeless tobacco: Never Used  Substance and Sexual Activity  . Alcohol use: Yes    Alcohol/week: 1.0 standard drinks    Types: 1 Shots of liquor per week    Comment: socially  . Drug  use: No  . Sexual activity: Not on file

## 2019-10-25 ENCOUNTER — Telehealth: Payer: Self-pay | Admitting: Orthopedic Surgery

## 2019-10-25 NOTE — Telephone Encounter (Signed)
Please advise. Thanks.  

## 2019-10-25 NOTE — Telephone Encounter (Signed)
Patient called.   She is requesting a refill on her HydroCodone.   Call back number: [phone]

## 2019-10-25 NOTE — Telephone Encounter (Signed)
See previous note. Waiting to be advised by [facility].

## 2019-10-25 NOTE — Telephone Encounter (Signed)
Patient called.   She is requesting she be given a prescription for HydroCodone again   Call back: [phone]

## 2019-10-26 ENCOUNTER — Other Ambulatory Visit: Payer: Self-pay | Admitting: Family Medicine

## 2019-10-26 ENCOUNTER — Telehealth: Payer: Self-pay | Admitting: Orthopedic Surgery

## 2019-10-26 DIAGNOSIS — M1710 Unilateral primary osteoarthritis, unspecified knee: Secondary | ICD-10-CM

## 2019-10-26 NOTE — Telephone Encounter (Signed)
See other note. This has been done. 

## 2019-10-26 NOTE — Telephone Encounter (Signed)
Patient called and stated that needed refill of Hydrocodone.  Please call patient to advise.  [phone]

## 2019-10-26 NOTE — Telephone Encounter (Signed)
IC s/w patient and advised. She verbalized understanding.  

## 2019-11-29 ENCOUNTER — Other Ambulatory Visit: Payer: Self-pay | Admitting: Family Medicine

## 2019-11-29 DIAGNOSIS — I10 Essential (primary) hypertension: Secondary | ICD-10-CM

## 2020-01-13 ENCOUNTER — Other Ambulatory Visit: Payer: Self-pay | Admitting: Family Medicine

## 2020-01-13 DIAGNOSIS — I10 Essential (primary) hypertension: Secondary | ICD-10-CM

## 2020-01-13 DIAGNOSIS — M1710 Unilateral primary osteoarthritis, unspecified knee: Secondary | ICD-10-CM

## 2020-01-21 ENCOUNTER — Other Ambulatory Visit: Payer: Self-pay | Admitting: Family Medicine

## 2020-01-21 ENCOUNTER — Other Ambulatory Visit: Payer: Self-pay

## 2020-01-21 ENCOUNTER — Encounter: Payer: Self-pay | Admitting: Family Medicine

## 2020-01-21 ENCOUNTER — Ambulatory Visit: Payer: Medicaid Other | Attending: Family Medicine | Admitting: Family Medicine

## 2020-01-21 DIAGNOSIS — K219 Gastro-esophageal reflux disease without esophagitis: Secondary | ICD-10-CM | POA: Diagnosis not present

## 2020-01-21 DIAGNOSIS — J4520 Mild intermittent asthma, uncomplicated: Secondary | ICD-10-CM | POA: Insufficient documentation

## 2020-01-21 DIAGNOSIS — I10 Essential (primary) hypertension: Secondary | ICD-10-CM

## 2020-01-21 DIAGNOSIS — I110 Hypertensive heart disease with heart failure: Secondary | ICD-10-CM | POA: Diagnosis not present

## 2020-01-21 DIAGNOSIS — I5042 Chronic combined systolic (congestive) and diastolic (congestive) heart failure: Secondary | ICD-10-CM | POA: Diagnosis not present

## 2020-01-21 DIAGNOSIS — Z791 Long term (current) use of non-steroidal anti-inflammatories (NSAID): Secondary | ICD-10-CM | POA: Diagnosis not present

## 2020-01-21 DIAGNOSIS — E7800 Pure hypercholesterolemia, unspecified: Secondary | ICD-10-CM | POA: Insufficient documentation

## 2020-01-21 DIAGNOSIS — Z79899 Other long term (current) drug therapy: Secondary | ICD-10-CM | POA: Diagnosis not present

## 2020-01-21 DIAGNOSIS — J322 Chronic ethmoidal sinusitis: Secondary | ICD-10-CM

## 2020-01-21 DIAGNOSIS — E785 Hyperlipidemia, unspecified: Secondary | ICD-10-CM | POA: Insufficient documentation

## 2020-01-21 MED ORDER — ATORVASTATIN CALCIUM 10 MG PO TABS: 10.0000 mg | ORAL_TABLET | Freq: Every day | ORAL | 6 refills | Status: DC

## 2020-01-21 MED ORDER — PANTOPRAZOLE SODIUM 20 MG PO TBEC: 20.0000 mg | DELAYED_RELEASE_TABLET | Freq: Every day | ORAL | 5 refills | Status: DC

## 2020-01-21 MED ORDER — FLUTICASONE-SALMETEROL 500-50 MCG/DOSE IN AEPB: INHALATION_SPRAY | RESPIRATORY_TRACT | 6 refills | Status: DC

## 2020-01-21 MED ORDER — FLUTICASONE PROPIONATE 50 MCG/ACT NA SUSP: 2.0000 | Freq: Every day | NASAL | 6 refills | Status: DC

## 2020-01-21 MED ORDER — POTASSIUM CHLORIDE CRYS ER 20 MEQ PO TBCR: 20.0000 meq | EXTENDED_RELEASE_TABLET | Freq: Every day | ORAL | 6 refills | Status: DC

## 2020-01-21 MED ORDER — METOPROLOL SUCCINATE ER 25 MG PO TB24: 25.0000 mg | ORAL_TABLET | Freq: Every day | ORAL | 6 refills | Status: DC

## 2020-01-21 MED ORDER — PROAIR HFA 108 (90 BASE) MCG/ACT IN AERS: 2.0000 | INHALATION_SPRAY | Freq: Four times a day (QID) | RESPIRATORY_TRACT | 5 refills | Status: DC | PRN

## 2020-01-21 MED ORDER — LOSARTAN POTASSIUM-HCTZ 100-25 MG PO TABS: 1.0000 | ORAL_TABLET | Freq: Every day | ORAL | 6 refills | Status: DC

## 2020-01-21 MED ORDER — FERROUS SULFATE 325 (65 FE) MG PO TBEC: 325.0000 mg | DELAYED_RELEASE_TABLET | Freq: Two times a day (BID) | ORAL | 3 refills | Status: DC

## 2020-01-21 MED ORDER — MONTELUKAST SODIUM 10 MG PO TABS: 10.0000 mg | ORAL_TABLET | Freq: Every day | ORAL | 6 refills | Status: DC

## 2020-01-21 NOTE — Progress Notes (Signed)
Subjective:  Patient ID: Angel Hebert, female    DOB: 01-17-1956  Age: 64 y.o. MRN: [id number]  CC: Hypertension   HPI [name]  is a 64 year old female with a history of hypertension, hyperlipidemia, asthma, allergic rhinitis, nasal polyps (Status post sinus surgery in 10/2016), GERD, bilateral knee osteoarthritis (status post left total knee arthroplasty in 09/2016), CHF (EF 45 to 50%) here for follow-up visit. Her asthma has been stable with no complaints and she is doing well on her allergy medications.  Tolerating her statin and her antihypertensive with no complaints of myalgias. She is on chronic NSAIDS for her bilateral knee osteoarthritis and reports doing well with no complaints of swelling of her knees and her gait is normal. With regards to her CHF she has no pedal edema, no orthopnea dyspnea or chest pain. She has no additional concerns today.  Past Medical History:  Diagnosis Date  . Arthritis   . Asthma    last year in December  . Dyspnea   . GERD (gastroesophageal reflux disease)   . High cholesterol   . Hypertension   . Pneumonia 01/2017    Past Surgical History:  Procedure Laterality Date  . ABDOMINAL HYSTERECTOMY    . nasal polyps     removed just this past tuesday at [place] facility over by [facility]  . TOTAL KNEE ARTHROPLASTY Left 10/01/2016   Procedure: LEFT TOTAL KNEE ARTHROPLASTY;  Surgeon: Meredith Pel, MD;  Location: [place];  Service: Orthopedics;  Laterality: Left;    Family History  Problem Relation Age of Onset  . Diabetes Sister   . Healthy Mother   . Healthy Father   . Colon cancer Neg Hx   . Breast cancer Neg Hx     No Known Allergies  Outpatient Medications Prior to Visit  Medication Sig Dispense Refill  . albuterol (PROVENTIL) (2.5 MG/3ML) 0.083% nebulizer solution Take 3 mLs (2.5 mg total) by nebulization every 6 (six) hours as needed for wheezing or shortness of breath. 75 mL 3  . ALL DAY ALLERGY 10 MG tablet  TAKE 1 TABLET BY MOUTH DAILY. 30 tablet 2  . HYDROcodone-acetaminophen (NORCO/VICODIN) 5-325 MG tablet 1 po q hs prn 10 tablet 0  . naproxen (NAPROSYN) 500 MG tablet TAKE 1 TABLET BY MOUTH 2 TIMES DAILY 30 tablet 0  . ondansetron (ZOFRAN) 4 MG tablet Take 1 tablet (4 mg total) by mouth every 6 (six) hours as needed for nausea. 20 tablet 0  . atorvastatin (LIPITOR) 10 MG tablet Take 1 tablet (10 mg total) by mouth daily at 6 PM. 30 tablet 6  . ferrous sulfate 325 (65 FE) MG EC tablet Take 1 tablet (325 mg total) by mouth 2 (two) times daily. 60 tablet 3  . fluticasone (FLONASE) 50 MCG/ACT nasal spray Place 2 sprays into both nostrils daily. 16 g 6  . Fluticasone-Salmeterol (ADVAIR DISKUS) 500-50 MCG/DOSE AEPB INHALE 1 PUFF INTO THE LUNGS EVERY 12 (TWELVE) HOURS. 60 each 6  . losartan-hydrochlorothiazide (HYZAAR) 100-25 MG tablet Take 1 tablet by mouth daily. Must have office visit for refills 30 tablet 0  . metoprolol succinate (TOPROL XL) 25 MG 24 hr tablet Take 1 tablet (25 mg total) by mouth daily. 30 tablet 6  . montelukast (SINGULAIR) 10 MG tablet Take 1 tablet (10 mg total) by mouth at bedtime. 30 tablet 6  . pantoprazole (PROTONIX) 20 MG tablet Take 1 tablet (20 mg total) by mouth daily. 30 tablet 5  . potassium chloride SA (  KLOR-CON) 20 MEQ tablet Take 1 tablet (20 mEq total) by mouth daily. 30 tablet 3  . PROAIR HFA 108 (90 Base) MCG/ACT inhaler Inhale 2 puffs into the lungs every 6 (six) hours as needed for wheezing or shortness of breath. 8.5 g 5  . furosemide (LASIX) 40 MG tablet Take 1 tablet (40 mg total) by mouth daily. 30 tablet 3   No facility-administered medications prior to visit.     ROS Review of Systems  Constitutional: Negative for activity change, appetite change and fatigue.  HENT: Negative for congestion, sinus pressure and sore throat.   Eyes: Negative for visual disturbance.  Respiratory: Negative for cough, chest tightness, shortness of breath and wheezing.     Cardiovascular: Negative for chest pain and palpitations.  Gastrointestinal: Negative for abdominal distention, abdominal pain and constipation.  Endocrine: Negative for polydipsia.  Genitourinary: Negative for dysuria and frequency.  Musculoskeletal: Negative for arthralgias and back pain.  Skin: Negative for rash.  Neurological: Negative for tremors, light-headedness and numbness.  Hematological: Does not bruise/bleed easily.  Psychiatric/Behavioral: Negative for agitation and behavioral problems.    Objective:  BP (!) 146/84   Pulse 75   Ht 5\' 7"  (1.702 m)   Wt 146 lb 9.6 oz (66.5 kg)   SpO2 99%   BMI 22.96 kg/m   BP/Weight 01/21/2020 10/06/2019 [id number]  Systolic BP 123456 - [id number]  Diastolic BP 84 - 92  Wt. (Lbs) 146.6 150 -  BMI 22.96 23.49 -      Physical Exam Constitutional:      Appearance: She is well-developed.  Neck:     Vascular: No JVD.  Cardiovascular:     Rate and Rhythm: Normal rate.     Heart sounds: Normal heart sounds. No murmur.  Pulmonary:     Effort: Pulmonary effort is normal.     Breath sounds: Normal breath sounds. No wheezing or rales.  Chest:     Chest wall: No tenderness.  Abdominal:     General: Bowel sounds are normal. There is no distension.     Palpations: Abdomen is soft. There is no mass.     Tenderness: There is no abdominal tenderness.  Musculoskeletal:        General: Normal range of motion.     Right lower leg: No edema.     Left lower leg: No edema.  Neurological:     Mental Status: She is alert and oriented to person, place, and time.  Psychiatric:        Mood and Affect: Mood normal.     CMP Latest Ref Rng & Units 08/11/2019 07/21/2019 07/20/2019  Glucose 65 - 99 mg/dL 108(H) 97 96  BUN 8 - 27 mg/dL 9 <5(L) <5(L)  Creatinine 0.57 - 1.00 mg/dL 0.59 0.47 0.50  Sodium 134 - 144 mmol/L 138 132(L) 133(L)  Potassium 3.5 - 5.2 mmol/L 3.5 3.6 3.3(L)  Chloride 96 - 106 mmol/L 99 96(L) 95(L)  CO2 20 - 29 mmol/L 23 25 26    Calcium 8.7 - 10.3 mg/dL 9.2 8.6(L) 8.3(L)  Total Protein 6.5 - 8.1 g/dL - - -  Total Bilirubin 0.3 - 1.2 mg/dL - - -  Alkaline Phos 38 - 126 U/L - - -  AST 15 - 41 U/L - - -  ALT 0 - 44 U/L - - -    Lipid Panel     Component Value Date/Time   CHOL 185 10/22/2018 0920   TRIG 129 10/22/2018 0920   HDL 81 10/22/2018  0920   CHOLHDL 2.3 10/22/2018 0920   CHOLHDL 2.5 05/20/2016 1452   VLDL 26 05/20/2016 1452   LDLCALC 78 10/22/2018 0920    CBC    Component Value Date/Time   WBC 6.7 08/31/2019 1043   WBC 9.6 07/21/2019 0252   RBC 4.38 08/31/2019 1043   RBC 3.92 07/21/2019 0252   HGB 11.1 08/31/2019 1043   HCT 35.9 08/31/2019 1043   PLT 414 08/31/2019 1043   MCV 82 08/31/2019 1043   MCH 25.3 (L) 08/31/2019 1043   MCH 24.2 (L) 07/21/2019 0252   MCHC 30.9 (L) 08/31/2019 1043   MCHC 30.5 07/21/2019 0252   RDW 22.2 (H) 08/31/2019 1043   LYMPHSABS 1.5 08/31/2019 1043   MONOABS 1.3 (H) 07/14/2019 0240   EOSABS 0.2 08/31/2019 1043   BASOSABS 0.1 08/31/2019 1043    Lab Results  Component Value Date   HGBA1C 5.3 10/26/2015    Assessment & Plan:  1. Pure hypercholesterolemia Controlled Counseled on low-cholesterol diet - atorvastatin (LIPITOR) 10 MG tablet; Take 1 tablet (10 mg total) by mouth daily at 6 PM.  Dispense: 30 tablet; Refill: 6  2. Chronic ethmoidal sinusitis Stable - fluticasone (FLONASE) 50 MCG/ACT nasal spray; Place 2 sprays into both nostrils daily.  Dispense: 16 g; Refill: 6  3. Mild intermittent asthma without complication Controlled with no exacerbations - Fluticasone-Salmeterol (ADVAIR DISKUS) 500-50 MCG/DOSE AEPB; INHALE 1 PUFF INTO THE LUNGS EVERY 12 (TWELVE) HOURS.  Dispense: 60 each; Refill: 6 - montelukast (SINGULAIR) 10 MG tablet; Take 1 tablet (10 mg total) by mouth at bedtime.  Dispense: 30 tablet; Refill: 6 - PROAIR HFA 108 (90 Base) MCG/ACT inhaler; Inhale 2 puffs into the lungs every 6 (six) hours as needed for wheezing or shortness of  breath.  Dispense: 8.5 g; Refill: 5  4. Essential hypertension Slightly above goal No regimen change today Counseled on blood pressure goal of less than 130/80, low-sodium, DASH diet, medication compliance, 150 minutes of moderate intensity exercise per week. Discussed medication compliance, adverse effects. - losartan-hydrochlorothiazide (HYZAAR) 100-25 MG tablet; Take 1 tablet by mouth daily.  Dispense: 30 tablet; Refill: 6  5. Hypertensive heart disease with chronic combined systolic and diastolic congestive heart failure (HCC) Euvolemic EF 45-50% on echo from 07/2019 - metoprolol succinate (TOPROL XL) 25 MG 24 hr tablet; Take 1 tablet (25 mg total) by mouth daily.  Dispense: 30 tablet; Refill: 6 - potassium chloride SA (KLOR-CON) 20 MEQ tablet; Take 1 tablet (20 mEq total) by mouth daily.  Dispense: 30 tablet; Refill: 6  6. Gastroesophageal reflux disease Stable - pantoprazole (PROTONIX) 20 MG tablet; Take 1 tablet (20 mg total) by mouth daily.  Dispense: 30 tablet; Refill: 5   Meds ordered this encounter  Medications  . atorvastatin (LIPITOR) 10 MG tablet    Sig: Take 1 tablet (10 mg total) by mouth daily at 6 PM.    Dispense:  30 tablet    Refill:  6  . ferrous sulfate 325 (65 FE) MG EC tablet    Sig: Take 1 tablet (325 mg total) by mouth 2 (two) times daily.    Dispense:  60 tablet    Refill:  3  . fluticasone (FLONASE) 50 MCG/ACT nasal spray    Sig: Place 2 sprays into both nostrils daily.    Dispense:  16 g    Refill:  6  . Fluticasone-Salmeterol (ADVAIR DISKUS) 500-50 MCG/DOSE AEPB    Sig: INHALE 1 PUFF INTO THE LUNGS EVERY 12 (TWELVE) HOURS.  Dispense:  60 each    Refill:  6  . losartan-hydrochlorothiazide (HYZAAR) 100-25 MG tablet    Sig: Take 1 tablet by mouth daily.    Dispense:  30 tablet    Refill:  6  . metoprolol succinate (TOPROL XL) 25 MG 24 hr tablet    Sig: Take 1 tablet (25 mg total) by mouth daily.    Dispense:  30 tablet    Refill:  6  .  montelukast (SINGULAIR) 10 MG tablet    Sig: Take 1 tablet (10 mg total) by mouth at bedtime.    Dispense:  30 tablet    Refill:  6  . pantoprazole (PROTONIX) 20 MG tablet    Sig: Take 1 tablet (20 mg total) by mouth daily.    Dispense:  30 tablet    Refill:  5  . potassium chloride SA (KLOR-CON) 20 MEQ tablet    Sig: Take 1 tablet (20 mEq total) by mouth daily.    Dispense:  30 tablet    Refill:  6  . PROAIR HFA 108 (90 Base) MCG/ACT inhaler    Sig: Inhale 2 puffs into the lungs every 6 (six) hours as needed for wheezing or shortness of breath.    Dispense:  8.5 g    Refill:  5    Follow-up: Return in about 1 month (around 02/21/2020) for complete physical exam.       Charlott Rakes, MD, FAAFP. [facility] and [place], [place]   01/21/2020, 11:32 AM

## 2020-03-01 ENCOUNTER — Encounter: Payer: Medicaid Other | Admitting: Family Medicine

## 2020-04-16 ENCOUNTER — Emergency Department (HOSPITAL_COMMUNITY): Payer: Medicaid Other

## 2020-04-16 ENCOUNTER — Inpatient Hospital Stay (HOSPITAL_COMMUNITY)
Admission: EM | Admit: 2020-04-16 | Discharge: 2020-04-21 | DRG: 481 | Disposition: A | Discharge status: Home / Self Care | Payer: Medicaid Other | Attending: Orthopedic Surgery | Admitting: Orthopedic Surgery

## 2020-04-16 ENCOUNTER — Encounter (HOSPITAL_COMMUNITY): Payer: Self-pay | Admitting: Emergency Medicine

## 2020-04-16 ENCOUNTER — Other Ambulatory Visit: Payer: Self-pay

## 2020-04-16 ENCOUNTER — Inpatient Hospital Stay (HOSPITAL_COMMUNITY): Payer: Medicaid Other

## 2020-04-16 DIAGNOSIS — F1010 Alcohol abuse, uncomplicated: Secondary | ICD-10-CM | POA: Diagnosis present

## 2020-04-16 DIAGNOSIS — I10 Essential (primary) hypertension: Secondary | ICD-10-CM | POA: Diagnosis not present

## 2020-04-16 DIAGNOSIS — M25562 Pain in left knee: Secondary | ICD-10-CM | POA: Diagnosis not present

## 2020-04-16 DIAGNOSIS — Z833 Family history of diabetes mellitus: Secondary | ICD-10-CM | POA: Diagnosis not present

## 2020-04-16 DIAGNOSIS — Z9889 Other specified postprocedural states: Secondary | ICD-10-CM

## 2020-04-16 DIAGNOSIS — Z9181 History of falling: Secondary | ICD-10-CM | POA: Diagnosis not present

## 2020-04-16 DIAGNOSIS — R52 Pain, unspecified: Secondary | ICD-10-CM | POA: Diagnosis not present

## 2020-04-16 DIAGNOSIS — J45909 Unspecified asthma, uncomplicated: Secondary | ICD-10-CM | POA: Diagnosis not present

## 2020-04-16 DIAGNOSIS — D62 Acute posthemorrhagic anemia: Secondary | ICD-10-CM | POA: Diagnosis not present

## 2020-04-16 DIAGNOSIS — E785 Hyperlipidemia, unspecified: Secondary | ICD-10-CM | POA: Diagnosis not present

## 2020-04-16 DIAGNOSIS — R000 Tachycardia, unspecified: Secondary | ICD-10-CM | POA: Diagnosis not present

## 2020-04-16 DIAGNOSIS — S72402A Unspecified fracture of lower end of left femur, initial encounter for closed fracture: Secondary | ICD-10-CM | POA: Diagnosis present

## 2020-04-16 DIAGNOSIS — M9702XA Periprosthetic fracture around internal prosthetic left hip joint, initial encounter: Secondary | ICD-10-CM | POA: Diagnosis not present

## 2020-04-16 DIAGNOSIS — Z419 Encounter for procedure for purposes other than remedying health state, unspecified: Secondary | ICD-10-CM

## 2020-04-16 DIAGNOSIS — S72492A Other fracture of lower end of left femur, initial encounter for closed fracture: Secondary | ICD-10-CM | POA: Diagnosis not present

## 2020-04-16 DIAGNOSIS — Z7289 Other problems related to lifestyle: Secondary | ICD-10-CM

## 2020-04-16 DIAGNOSIS — R531 Weakness: Secondary | ICD-10-CM | POA: Diagnosis not present

## 2020-04-16 DIAGNOSIS — K219 Gastro-esophageal reflux disease without esophagitis: Secondary | ICD-10-CM | POA: Diagnosis present

## 2020-04-16 DIAGNOSIS — M7989 Other specified soft tissue disorders: Secondary | ICD-10-CM | POA: Diagnosis not present

## 2020-04-16 DIAGNOSIS — S0990XA Unspecified injury of head, initial encounter: Secondary | ICD-10-CM | POA: Diagnosis not present

## 2020-04-16 DIAGNOSIS — Z79899 Other long term (current) drug therapy: Secondary | ICD-10-CM

## 2020-04-16 DIAGNOSIS — M9702XD Periprosthetic fracture around internal prosthetic left hip joint, subsequent encounter: Secondary | ICD-10-CM | POA: Diagnosis not present

## 2020-04-16 DIAGNOSIS — W19XXXA Unspecified fall, initial encounter: Secondary | ICD-10-CM | POA: Diagnosis present

## 2020-04-16 DIAGNOSIS — S72302A Unspecified fracture of shaft of left femur, initial encounter for closed fracture: Secondary | ICD-10-CM | POA: Diagnosis not present

## 2020-04-16 DIAGNOSIS — R609 Edema, unspecified: Secondary | ICD-10-CM | POA: Diagnosis not present

## 2020-04-16 DIAGNOSIS — Z7951 Long term (current) use of inhaled steroids: Secondary | ICD-10-CM | POA: Diagnosis not present

## 2020-04-16 DIAGNOSIS — Z20822 Contact with and (suspected) exposure to covid-19: Secondary | ICD-10-CM | POA: Diagnosis present

## 2020-04-16 DIAGNOSIS — M79604 Pain in right leg: Secondary | ICD-10-CM | POA: Diagnosis not present

## 2020-04-16 DIAGNOSIS — E7800 Pure hypercholesterolemia, unspecified: Secondary | ICD-10-CM | POA: Diagnosis present

## 2020-04-16 DIAGNOSIS — M9712XA Periprosthetic fracture around internal prosthetic left knee joint, initial encounter: Secondary | ICD-10-CM | POA: Diagnosis not present

## 2020-04-16 DIAGNOSIS — Z789 Other specified health status: Secondary | ICD-10-CM

## 2020-04-16 LAB — BASIC METABOLIC PANEL
Anion gap: 12 (ref 5–15)
BUN: 6 mg/dL — ABNORMAL LOW (ref 8–23)
CO2: 23 mmol/L (ref 22–32)
Calcium: 8.5 mg/dL — ABNORMAL LOW (ref 8.9–10.3)
Chloride: 102 mmol/L (ref 98–111)
Creatinine, Ser: 0.56 mg/dL (ref 0.44–1.00)
GFR calc Af Amer: >60 mL/min (ref >60)
GFR calc non Af Amer: >60 mL/min (ref >60)
Glucose, Bld: 114 mg/dL — ABNORMAL HIGH (ref 70–99)
Potassium: 3.7 mmol/L (ref 3.5–5.1)
Sodium: 137 mmol/L (ref 135–145)

## 2020-04-16 LAB — HEPATIC FUNCTION PANEL
ALT: 33 U/L (ref 0–44)
AST: 73 U/L — ABNORMAL HIGH (ref 15–41)
Albumin: 2.8 g/dL — ABNORMAL LOW (ref 3.5–5.0)
Alkaline Phosphatase: 111 U/L (ref 38–126)
Bilirubin, Direct: 0.4 mg/dL — ABNORMAL HIGH (ref 0.0–0.2)
Indirect Bilirubin: 1.3 mg/dL — ABNORMAL HIGH (ref 0.3–0.9)
Total Bilirubin: 1.7 mg/dL — ABNORMAL HIGH (ref 0.3–1.2)
Total Protein: 6.8 g/dL (ref 6.5–8.1)

## 2020-04-16 LAB — CBC WITH DIFFERENTIAL/PLATELET
Abs Immature Granulocytes: 0.01 10*3/uL (ref 0.00–0.07)
Basophils Absolute: 0 10*3/uL (ref 0.0–0.1)
Basophils Relative: 1 %
Eosinophils Absolute: 0 10*3/uL (ref 0.0–0.5)
Eosinophils Relative: 0 %
HCT: 29 % — ABNORMAL LOW (ref 36.0–46.0)
Hemoglobin: 9.6 g/dL — ABNORMAL LOW (ref 12.0–15.0)
Immature Granulocytes: 0 %
Lymphocytes Relative: 16 %
Lymphs Abs: 0.8 10*3/uL (ref 0.7–4.0)
MCH: 31 pg (ref 26.0–34.0)
MCHC: 33.1 g/dL (ref 30.0–36.0)
MCV: 93.5 fL (ref 80.0–100.0)
Monocytes Absolute: 0.5 10*3/uL (ref 0.1–1.0)
Monocytes Relative: 9 %
Neutro Abs: 4 10*3/uL (ref 1.7–7.7)
Neutrophils Relative %: 74 %
Platelets: 223 10*3/uL (ref 150–400)
RBC: 3.1 MIL/uL — ABNORMAL LOW (ref 3.87–5.11)
RDW: 19.2 % — ABNORMAL HIGH (ref 11.5–15.5)
WBC: 5.4 10*3/uL (ref 4.0–10.5)
nRBC: 0 % (ref 0.0–0.2)

## 2020-04-16 LAB — SARS CORONAVIRUS 2 BY RT PCR (HOSPITAL ORDER, PERFORMED IN ~~LOC~~ HOSPITAL LAB): SARS Coronavirus 2: NEGATIVE

## 2020-04-16 LAB — ETHANOL: Alcohol, Ethyl (B): <10 mg/dL (ref <10)

## 2020-04-16 MED ORDER — LORAZEPAM 2 MG/ML IJ SOLN
0.0000 mg | Freq: Two times a day (BID) | INTRAMUSCULAR | Status: AC
  Administered 2020-04-20: 2 mg via INTRAVENOUS
  Filled 2020-04-16 (×2): qty 1
  Filled 2020-04-16: qty 2

## 2020-04-16 MED ORDER — LORAZEPAM 2 MG/ML IJ SOLN: 0.0000 mg | Freq: Four times a day (QID) | INTRAMUSCULAR | Status: AC

## 2020-04-16 MED ORDER — OXYCODONE-ACETAMINOPHEN 5-325 MG PO TABS
1.0000 | ORAL_TABLET | ORAL | Status: DC | PRN
  Administered 2020-04-16 – 2020-04-19 (×4): 2 via ORAL
  Filled 2020-04-16 (×4): qty 2

## 2020-04-16 MED ORDER — LORAZEPAM 1 MG PO TABS: 0.0000 mg | ORAL_TABLET | Freq: Four times a day (QID) | ORAL | Status: AC

## 2020-04-16 MED ORDER — HYDROCODONE-ACETAMINOPHEN 5-325 MG PO TABS: 2.0000 | ORAL_TABLET | Freq: Once | ORAL | Status: DC

## 2020-04-16 MED ORDER — LORAZEPAM 1 MG PO TABS: 0.0000 mg | ORAL_TABLET | Freq: Two times a day (BID) | ORAL | Status: AC

## 2020-04-16 MED ORDER — MORPHINE SULFATE (PF) 2 MG/ML IV SOLN
2.0000 mg | INTRAVENOUS | Status: DC | PRN
  Administered 2020-04-16 – 2020-04-20 (×5): 2 mg via INTRAVENOUS
  Filled 2020-04-16 (×5): qty 1

## 2020-04-16 MED ORDER — THIAMINE HCL 100 MG PO TABS
100.0000 mg | ORAL_TABLET | Freq: Every day | ORAL | Status: DC
  Administered 2020-04-16 – 2020-04-21 (×5): 100 mg via ORAL
  Filled 2020-04-16 (×5): qty 1

## 2020-04-16 MED ORDER — THIAMINE HCL 100 MG/ML IJ SOLN
100.0000 mg | Freq: Every day | INTRAMUSCULAR | Status: DC
  Administered 2020-04-17: 100 mg via INTRAVENOUS
  Filled 2020-04-16: qty 2

## 2020-04-16 NOTE — Plan of Care (Signed)

## 2020-04-16 NOTE — ED Triage Notes (Signed)
Pt to triage via [facility] from home.  C/o L knee pain and swelling since this morning.  No known injury.

## 2020-04-16 NOTE — ED Provider Notes (Signed)
[facility] EMERGENCY DEPARTMENT Provider Note   CSN: [id number] Arrival date & time: 04/16/20  1307     History Chief Complaint  Patient presents with  . Knee Pain    Angel Hebert is a 64 y.o. female.  64yo F w/ PMH below including HTN, HLD who p/w L knee pain.  She reports a 1 week hx of L knee 10/10 pain and swelling, worse w/ ambulation. Of note, she had knee replacement in the past on this side. No hx of trauma or injury. No fevers, N/V, or rash. No other joint pains/swelling.   The history is provided by the patient.  Knee Pain      Past Medical History:  Diagnosis Date  . Arthritis   . Asthma    last year in December  . Dyspnea   . GERD (gastroesophageal reflux disease)   . High cholesterol   . Hypertension   . Pneumonia 01/2017    Patient Active Problem List   Diagnosis Date Noted  . Closed fracture of left distal femur ([place]) 04/16/2020  . Symptomatic anemia 07/12/2019  . Lobar pneumonia ([place]) 07/12/2019  . Insomnia 03/03/2017  . 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 Arthritis of knee 10/01/2016  . Physical deconditioning   . Anemia, iron deficiency   . Pressure ulcer 10/14/2015  . CAP (community acquired pneumonia)   . Sepsis due to Streptococcus pneumoniae ([place])   . Hyperlipidemia 03/02/2007  . Essential hypertension 03/02/2007  . Allergic rhinitis 03/02/2007  . Asthma 03/02/2007  . GERD 03/02/2007    Past Surgical History:  Procedure Laterality Date  . ABDOMINAL HYSTERECTOMY    . nasal polyps     removed just this past tuesday at [facility] facility over by [facility]  . TOTAL KNEE ARTHROPLASTY Left 10/01/2016   Procedure: LEFT TOTAL KNEE ARTHROPLASTY;  Surgeon: Meredith Pel, MD;  Location: [facility];  Service: Orthopedics;  Laterality: Left;     OB History   No obstetric history on file.     Family History  Problem Relation Age of Onset  . Diabetes Sister   . Healthy Mother   . Healthy Father    . Colon cancer Neg Hx   . Breast cancer Neg Hx     Social History   Tobacco Use  . Smoking status: Never Smoker  . Smokeless tobacco: Never Used  Vaping Use  . Vaping Use: Never used  Substance Use Topics  . Alcohol use: Yes    Alcohol/week: 1.0 standard drink    Types: 1 Shots of liquor per week    Comment: socially  . Drug use: No    Home Medications Prior to Admission medications   Medication Sig Start Date End Date Taking? Authorizing Provider  albuterol (PROVENTIL) (2.5 MG/3ML) 0.083% nebulizer solution Take 3 mLs (2.5 mg total) by nebulization every 6 (six) hours as needed for wheezing or shortness of breath. 10/19/18   Charlott Rakes, MD  ALL DAY ALLERGY 10 MG tablet TAKE 1 TABLET BY MOUTH DAILY. 01/28/17   Charlott Rakes, MD  atorvastatin (LIPITOR) 10 MG tablet Take 1 tablet (10 mg total) by mouth daily at 6 PM. 01/21/20   Charlott Rakes, MD  ferrous sulfate 325 (65 FE) MG EC tablet Take 1 tablet (325 mg total) by mouth 2 (two) times daily. 01/21/20   Charlott Rakes, MD  fluticasone (FLONASE) 50 MCG/ACT nasal spray Place 2 sprays into both nostrils daily. 01/21/20   Charlott Rakes, MD  Fluticasone-Salmeterol (ADVAIR DISKUS) 500-50 MCG/DOSE AEPB INHALE 1 PUFF INTO THE LUNGS EVERY 12 (TWELVE) HOURS. 01/21/20   Charlott Rakes, MD  furosemide (LASIX) 40 MG tablet Take 1 tablet (40 mg total) by mouth daily. 08/03/19 10/06/19  Charlott Rakes, MD  HYDROcodone-acetaminophen (NORCO/VICODIN) 5-325 MG tablet 1 po q hs prn 10/13/19   Meredith Pel, MD  losartan-hydrochlorothiazide (HYZAAR) 100-25 MG tablet Take 1 tablet by mouth daily. 01/21/20   Charlott Rakes, MD  metoprolol succinate (TOPROL XL) 25 MG 24 hr tablet Take 1 tablet (25 mg total) by mouth daily. 01/21/20   Charlott Rakes, MD  montelukast (SINGULAIR) 10 MG tablet Take 1 tablet (10 mg total) by mouth at bedtime. 01/21/20   Charlott Rakes, MD  naproxen (NAPROSYN) 500 MG tablet TAKE 1 TABLET BY MOUTH 2 TIMES DAILY  10/26/19   Charlott Rakes, MD  ondansetron (ZOFRAN) 4 MG tablet Take 1 tablet (4 mg total) by mouth every 6 (six) hours as needed for nausea. 07/21/19   Aline August, MD  pantoprazole (PROTONIX) 20 MG tablet Take 1 tablet (20 mg total) by mouth daily. 01/21/20   Charlott Rakes, MD  potassium chloride SA (KLOR-CON) 20 MEQ tablet Take 1 tablet (20 mEq total) by mouth daily. 01/21/20   Charlott Rakes, MD  PROAIR HFA 108 (90 Base) MCG/ACT inhaler Inhale 2 puffs into the lungs every 6 (six) hours as needed for wheezing or shortness of breath. 01/21/20   Charlott Rakes, MD  traZODone (DESYREL) 50 MG tablet Take 1 tablet (50 mg total) by mouth at bedtime as needed for sleep. 07/21/19 09/16/19  Aline August, MD    Allergies    Patient has no known allergies.  Review of Systems   Review of Systems All other systems reviewed and are negative except that which was mentioned in HPI  Physical Exam Updated Vital Signs BP (!) 148/86 (BP Location: Left Arm)   Pulse (!) 112   Temp 99 F (37.2 C) (Oral)   Resp 16   SpO2 99%   Physical Exam Vitals and nursing note reviewed.  Constitutional:      General: She is not in acute distress.    Appearance: She is well-developed.  HENT:     Head: Normocephalic and atraumatic.  Eyes:     Conjunctiva/sclera: Conjunctivae normal.  Cardiovascular:     Rate and Rhythm: Normal rate and regular rhythm.     Pulses: Normal pulses.     Heart sounds: Normal heart sounds. No murmur heard.   Pulmonary:     Effort: Pulmonary effort is normal.     Breath sounds: Normal breath sounds.  Abdominal:     General: There is no distension.     Palpations: Abdomen is soft.     Tenderness: There is no abdominal tenderness.  Musculoskeletal:        General: Swelling and tenderness present.     Cervical back: Neck supple.     Comments: Edema and tenderness of entire L knee and distal femur with mild warmth, no erythema or skin changes, held in slight flexion and unable  to range knee 2/2 pain; normal distal sensation  Skin:    General: Skin is warm and dry.  Neurological:     Mental Status: She is alert and oriented to person, place, and time.     Comments: Fluent speech  Psychiatric:        Judgment: Judgment normal.     ED Results / Procedures / Treatments   Labs (all  labs ordered are listed, but only abnormal results are displayed) Labs Reviewed  BASIC METABOLIC PANEL - Abnormal; Notable for the following components:      Result Value   Glucose, Bld 114 (*)    BUN 6 (*)    Calcium 8.5 (*)    All other components within normal limits  CBC WITH DIFFERENTIAL/PLATELET - Abnormal; Notable for the following components:   RBC 3.10 (*)    Hemoglobin 9.6 (*)    HCT 29.0 (*)    RDW 19.2 (*)    All other components within normal limits  SARS CORONAVIRUS 2 BY RT PCR (HOSPITAL ORDER, [facility])  HEPATIC FUNCTION PANEL  ETHANOL  BASIC METABOLIC PANEL  CBC    EKG None  Radiology DG Chest 1 View  Result Date: 04/16/2020 CLINICAL DATA:  Left knee pain and swelling. EXAM: CHEST  1 VIEW COMPARISON:  July 20, 2019 FINDINGS: There is no evidence of acute infiltrate, pleural effusion or pneumothorax. The heart size and mediastinal contours are within normal limits. The visualized skeletal structures are unremarkable. IMPRESSION: No active disease. Electronically Signed   By: Virgina Norfolk M.D.   On: 04/16/2020 17:14   DG Knee Complete 4 Views Left  Result Date: 04/16/2020 CLINICAL DATA:  Left knee pain and swelling EXAM: LEFT KNEE - COMPLETE 4+ VIEW COMPARISON:  None. FINDINGS: The patient is status post left total knee arthroplasty. There is a comminuted periprosthetic distal femur fracture with slight anterior angulation of the femoral staffed. A moderate knee joint effusion and diffuse soft tissue swelling seen around the knee. IMPRESSION: Comminuted mildly angulated distal femoral periprosthetic fracture.  Electronically Signed   By: Prudencio Pair M.D.   On: 04/16/2020 17:16   DG Femur Min 2 Views Left  Result Date: 04/16/2020 CLINICAL DATA:  Atraumatic left knee pain and swelling. EXAM: LEFT FEMUR 2 VIEWS COMPARISON:  None. FINDINGS: Acute fracture is seen involving the distal shaft of the left femur. Approximately 1/2 shaft width medial displacement of the distal fracture site is seen. A left knee replacement is noted without evidence of surrounding lucency to suggest the presence of hardware loosening or infection. A moderate-sized patellofemoral joint effusion is noted. IMPRESSION: 1. Acute fracture of the distal left femur. 2. Left knee replacement without evidence of hardware loosening or infection. Electronically Signed   By: Virgina Norfolk M.D.   On: 04/16/2020 17:15    Procedures Procedures (including critical care time)  Medications Ordered in ED Medications  morphine 2 MG/ML injection 2 mg (has no administration in time range)  oxyCODONE-acetaminophen (PERCOCET/ROXICET) 5-325 MG per tablet 1-2 tablet (has no administration in time range)  LORazepam (ATIVAN) injection 0-4 mg (has no administration in time range)    Or  LORazepam (ATIVAN) tablet 0-4 mg (has no administration in time range)  LORazepam (ATIVAN) injection 0-4 mg (has no administration in time range)    Or  LORazepam (ATIVAN) tablet 0-4 mg (has no administration in time range)  thiamine tablet 100 mg (has no administration in time range)    Or  thiamine (B-1) injection 100 mg (has no administration in time range)    ED Course  I have reviewed the triage vital signs and the nursing notes.  Pertinent labs & imaging results that were available during my care of the patient were reviewed by me and considered in my medical decision making (see chart for details).    MDM Rules/Calculators/A&P  Pt mildly tachycardic, T 99 on exam. XR shows comminuted distal femur fx just above hardware.   Contacted orthopedics, Dr. Lorin Mercy, who evaluated the patient in the ED.  Upon further questioning, the patient later changed her story and stated that she had fallen although it is unclear when she actually fell.  She does admit to drinking alcohol recently.  I have ordered CIWA protocol and head CT as a precaution. PT will be admitted to ortho for surgical management.  Final Clinical Impression(s) / ED Diagnoses Final diagnoses:  None    Rx / DC Orders ED Discharge Orders    None       Azia Toutant, Wenda Overland, MD 04/16/20 1825

## 2020-04-16 NOTE — ED Notes (Signed)
Attempted report x1. 5N states room is still dirty EVS has not been by yet.

## 2020-04-16 NOTE — Progress Notes (Signed)
Orthopedic Tech Progress Note Patient Details:  Angel Hebert [birth year] [id number]  Ortho Devices Type of Ortho Device: Knee Immobilizer Ortho Device/Splint Location: RLE Ortho Device/Splint Interventions: Application, Ordered   Post Interventions Patient Tolerated: Well Instructions Provided: Adjustment of device   Angel Hebert [name] 04/16/2020, 7:08 PM

## 2020-04-16 NOTE — H&P (Signed)
Angel Hebert is an 64 y.o. female.   Chief Complaint: fall with left supracondylar closed distal femur fracture above TKA by Dr. Marlou Sa HPI: 64 year old female likes to drink beer daily had total knee arthroplasty by Dr. Alphonzo Severance left knee initially told the ER MD that she had not fallen and had pain in her leg for several days.  When I saw her she admits that she fell this morning and has not been able to walk since she fell.  She last went to the bathroom earlier this morning and has a short oblique closed distal femur supracondylar fracture above well fixed total knee arthroplasty which is cemented.  Patient had been ambulating.  She drinks 6+ beers daily.  She denies any other narcotic pain medication.  She does have hypertension and hyperlipidemia as well as GERD otherwise healthy.  She lives with her boyfriend.  She states she does fall down and it usually when she has been drinking she does not fall she has not been drinking.  Past Medical History:  Diagnosis Date  . Arthritis   . Asthma    last year in December  . Dyspnea   . GERD (gastroesophageal reflux disease)   . High cholesterol   . Hypertension   . Pneumonia 01/2017    Past Surgical History:  Procedure Laterality Date  . ABDOMINAL HYSTERECTOMY    . nasal polyps     removed just this past tuesday at [facility] facility over by [facility]  . TOTAL KNEE ARTHROPLASTY Left 10/01/2016   Procedure: LEFT TOTAL KNEE ARTHROPLASTY;  Surgeon: Meredith Pel, MD;  Location: [facility];  Service: Orthopedics;  Laterality: Left;    Family History  Problem Relation Age of Onset  . Diabetes Sister   . Healthy Mother   . Healthy Father   . Colon cancer Neg Hx   . Breast cancer Neg Hx    Social History:  reports that she has never smoked. She has never used smokeless tobacco. She reports current alcohol use of about 1.0 standard drink of alcohol per week. She reports that she does not use drugs.  Allergies: No Known Allergies  (Not  in a hospital admission)   Results for orders placed or performed during the hospital encounter of 04/16/20 (from the past 48 hour(s))  Basic metabolic panel     Status: Abnormal   Collection Time: 04/16/20  5:12 PM  Result Value Ref Range   Sodium 137 135 - 145 mmol/L   Potassium 3.7 3.5 - 5.1 mmol/L   Chloride 102 98 - 111 mmol/L   CO2 23 22 - 32 mmol/L   Glucose, Bld 114 (H) 70 - 99 mg/dL    Comment: Glucose reference range applies only to samples taken after fasting for at least 8 hours.   BUN 6 (L) 8 - 23 mg/dL   Creatinine, Ser 0.56 0.44 - 1.00 mg/dL   Calcium 8.5 (L) 8.9 - 10.3 mg/dL   GFR calc non Af Amer >60 >60 mL/min   GFR calc Af Amer >60 >60 mL/min   Anion gap 12 5 - 15    Comment: Performed at [facility] [address] [place], [place] [address]  CBC with Differential     Status: Abnormal   Collection Time: 04/16/20  5:12 PM  Result Value Ref Range   WBC 5.4 4.0 - 10.5 K/uL   RBC 3.10 (L) 3.87 - 5.11 MIL/uL   Hemoglobin 9.6 (L) 12.0 - 15.0 g/dL  HCT 29.0 (L) 36 - 46 %   MCV 93.5 80.0 - 100.0 fL   MCH 31.0 26.0 - 34.0 pg   MCHC 33.1 30.0 - 36.0 g/dL   RDW 19.2 (H) 11.5 - 15.5 %   Platelets 223 150 - 400 K/uL   nRBC 0.0 0.0 - 0.2 %   Neutrophils Relative % 74 %   Neutro Abs 4.0 1.7 - 7.7 K/uL   Lymphocytes Relative 16 %   Lymphs Abs 0.8 0.7 - 4.0 K/uL   Monocytes Relative 9 %   Monocytes Absolute 0.5 0 - 1 K/uL   Eosinophils Relative 0 %   Eosinophils Absolute 0.0 0 - 0 K/uL   Basophils Relative 1 %   Basophils Absolute 0.0 0 - 0 K/uL   Immature Granulocytes 0 %   Abs Immature Granulocytes 0.01 0.00 - 0.07 K/uL    Comment: Performed at [facility] [address], [place], [place] [address]   DG Chest 1 View  Result Date: 04/16/2020 CLINICAL DATA:  Left knee pain and swelling. EXAM: CHEST  1 VIEW COMPARISON:  July 20, 2019 FINDINGS: There is no evidence of acute infiltrate, pleural effusion or pneumothorax. The heart size and  mediastinal contours are within normal limits. The visualized skeletal structures are unremarkable. IMPRESSION: No active disease. Electronically Signed   By: Virgina Norfolk M.D.   On: 04/16/2020 17:14   DG Knee Complete 4 Views Left  Result Date: 04/16/2020 CLINICAL DATA:  Left knee pain and swelling EXAM: LEFT KNEE - COMPLETE 4+ VIEW COMPARISON:  None. FINDINGS: The patient is status post left total knee arthroplasty. There is a comminuted periprosthetic distal femur fracture with slight anterior angulation of the femoral staffed. A moderate knee joint effusion and diffuse soft tissue swelling seen around the knee. IMPRESSION: Comminuted mildly angulated distal femoral periprosthetic fracture. Electronically Signed   By: Prudencio Pair M.D.   On: 04/16/2020 17:16   DG Femur Min 2 Views Left  Result Date: 04/16/2020 CLINICAL DATA:  Atraumatic left knee pain and swelling. EXAM: LEFT FEMUR 2 VIEWS COMPARISON:  None. FINDINGS: Acute fracture is seen involving the distal shaft of the left femur. Approximately 1/2 shaft width medial displacement of the distal fracture site is seen. A left knee replacement is noted without evidence of surrounding lucency to suggest the presence of hardware loosening or infection. A moderate-sized patellofemoral joint effusion is noted. IMPRESSION: 1. Acute fracture of the distal left femur. 2. Left knee replacement without evidence of hardware loosening or infection. Electronically Signed   By: Virgina Norfolk M.D.   On: 04/16/2020 17:15    Review of Systems  Constitutional: Negative for activity change and chills.  HENT: Negative.  Negative for congestion.   Respiratory: Negative.   Gastrointestinal:       Possible GERD positive for daily alcohol consumption, beer.  Genitourinary: Negative.   Musculoskeletal:       Positive for falls when drinking.  Left total knee arthroplasty 2018 Dr. Alphonzo Severance.  Neurological:       Negative for DTs never been through alcohol  withdrawal.  Psychiatric/Behavioral: Negative for agitation and suicidal ideas.    Blood pressure (!) 148/86, pulse (!) 112, temperature 99 F (37.2 C), temperature source Oral, resp. rate 16, SpO2 99 %. Physical Exam HENT:     Head: Normocephalic.     Right Ear: External ear normal.     Left Ear: External ear normal.     Nose: Nose normal.  Eyes:     Extraocular Movements: Extraocular movements intact.     Pupils: Pupils are equal, round, and reactive to light.  Cardiovascular:     Rate and Rhythm: Normal rate.     Pulses: Normal pulses.  Pulmonary:     Effort: Pulmonary effort is normal.     Breath sounds: No wheezing.  Abdominal:     General: Abdomen is flat. There is no distension.     Tenderness: There is no abdominal tenderness.  Musculoskeletal:     Comments: Swelling less distal femur above the knee.  Instability.  Intact sensation of the foot.  Skin:    Capillary Refill: Capillary refill takes less than 2 seconds.  Neurological:     General: No focal deficit present.     Mental Status: She is alert.  Psychiatric:        Mood and Affect: Mood normal.        Behavior: Behavior normal.      Assessment/Plan Patient has alcohol abuse history of falls with drinking.  Fell this morning with supracondylar femur fracture periprosthetic.  CT scan of the head being performed now and CT scan distal femur for preoperative planning.  Dr. Alphonzo Severance will see her in the morning and then decide on scheduling for either lateral distal femur plating or retrograde nailing stabilization of her fracture.  Marybelle Killings, MD 04/16/2020, 6:06 PM

## 2020-04-17 ENCOUNTER — Inpatient Hospital Stay (HOSPITAL_COMMUNITY): Payer: Medicaid Other | Admitting: Anesthesiology

## 2020-04-17 ENCOUNTER — Encounter (HOSPITAL_COMMUNITY)
Admission: EM | Disposition: A | Discharge status: Home / Self Care | Payer: Self-pay | Source: Home / Self Care | Attending: Orthopedic Surgery

## 2020-04-17 ENCOUNTER — Inpatient Hospital Stay (HOSPITAL_COMMUNITY): Payer: Medicaid Other

## 2020-04-17 ENCOUNTER — Encounter (HOSPITAL_COMMUNITY): Payer: Self-pay | Admitting: Orthopaedic Surgery

## 2020-04-17 DIAGNOSIS — M9712XA Periprosthetic fracture around internal prosthetic left knee joint, initial encounter: Secondary | ICD-10-CM

## 2020-04-17 HISTORY — PX: FEMUR IM NAIL: SHX1597

## 2020-04-17 LAB — SURGICAL PCR SCREEN
MRSA, PCR: NEGATIVE
Staphylococcus aureus: NEGATIVE

## 2020-04-17 LAB — CBC
HCT: 24.8 % — ABNORMAL LOW (ref 36.0–46.0)
HCT: 26.3 % — ABNORMAL LOW (ref 36.0–46.0)
Hemoglobin: 8.3 g/dL — ABNORMAL LOW (ref 12.0–15.0)
Hemoglobin: 8.4 g/dL — ABNORMAL LOW (ref 12.0–15.0)
MCH: 31.3 pg (ref 26.0–34.0)
MCH: 30.8 pg (ref 26.0–34.0)
MCHC: 33.5 g/dL (ref 30.0–36.0)
MCHC: 31.9 g/dL (ref 30.0–36.0)
MCV: 93.6 fL (ref 80.0–100.0)
MCV: 96.3 fL (ref 80.0–100.0)
Platelets: 191 10*3/uL (ref 150–400)
Platelets: 173 10*3/uL (ref 150–400)
RBC: 2.65 MIL/uL — ABNORMAL LOW (ref 3.87–5.11)
RBC: 2.73 MIL/uL — ABNORMAL LOW (ref 3.87–5.11)
RDW: 18.8 % — ABNORMAL HIGH (ref 11.5–15.5)
RDW: 18.8 % — ABNORMAL HIGH (ref 11.5–15.5)
WBC: 3.9 10*3/uL — ABNORMAL LOW (ref 4.0–10.5)
WBC: 6.9 10*3/uL (ref 4.0–10.5)
nRBC: 0.5 % — ABNORMAL HIGH (ref 0.0–0.2)
nRBC: 0.4 % — ABNORMAL HIGH (ref 0.0–0.2)

## 2020-04-17 LAB — BASIC METABOLIC PANEL
Anion gap: 8 (ref 5–15)
BUN: 7 mg/dL — ABNORMAL LOW (ref 8–23)
CO2: 25 mmol/L (ref 22–32)
Calcium: 8.5 mg/dL — ABNORMAL LOW (ref 8.9–10.3)
Chloride: 104 mmol/L (ref 98–111)
Creatinine, Ser: 0.6 mg/dL (ref 0.44–1.00)
GFR calc Af Amer: >60 mL/min (ref >60)
GFR calc non Af Amer: >60 mL/min (ref >60)
Glucose, Bld: 105 mg/dL — ABNORMAL HIGH (ref 70–99)
Potassium: 3.5 mmol/L (ref 3.5–5.1)
Sodium: 137 mmol/L (ref 135–145)

## 2020-04-17 LAB — CREATININE, SERUM
Creatinine, Ser: 0.75 mg/dL (ref 0.44–1.00)
GFR calc Af Amer: >60 mL/min (ref >60)
GFR calc non Af Amer: >60 mL/min (ref >60)

## 2020-04-17 SURGERY — INSERTION, INTRAMEDULLARY ROD, FEMUR, RETROGRADE
Anesthesia: General | Site: Leg Upper | Laterality: Left

## 2020-04-17 MED ORDER — BUPIVACAINE-EPINEPHRINE 0.5% -1:200000 IJ SOLN
INTRAMUSCULAR | Status: DC | PRN
  Administered 2020-04-17: 30 mL

## 2020-04-17 MED ORDER — STERILE WATER FOR IRRIGATION IR SOLN
Status: DC | PRN
  Administered 2020-04-17: 1000 mL

## 2020-04-17 MED ORDER — PHENOL 1.4 % MT LIQD: 1.0000 | OROMUCOSAL | Status: DC | PRN

## 2020-04-17 MED ORDER — METOCLOPRAMIDE HCL 5 MG/ML IJ SOLN: 5.0000 mg | Freq: Three times a day (TID) | INTRAMUSCULAR | Status: DC | PRN

## 2020-04-17 MED ORDER — LACTATED RINGERS IV SOLN: INTRAVENOUS | Status: DC

## 2020-04-17 MED ORDER — TRANEXAMIC ACID-NACL 1000-0.7 MG/100ML-% IV SOLN
INTRAVENOUS | Status: AC
  Filled 2020-04-17: qty 100

## 2020-04-17 MED ORDER — ONDANSETRON HCL 4 MG/2ML IJ SOLN: 4.0000 mg | Freq: Four times a day (QID) | INTRAMUSCULAR | Status: DC | PRN

## 2020-04-17 MED ORDER — SUGAMMADEX SODIUM 200 MG/2ML IV SOLN
INTRAVENOUS | Status: DC | PRN
  Administered 2020-04-17: 200 mg via INTRAVENOUS

## 2020-04-17 MED ORDER — DEXAMETHASONE SODIUM PHOSPHATE 10 MG/ML IJ SOLN
INTRAMUSCULAR | Status: DC | PRN
Start: 2020-04-17 — End: 2020-04-17
  Administered 2020-04-17: 10 mg via INTRAVENOUS

## 2020-04-17 MED ORDER — PROPOFOL 10 MG/ML IV BOLUS
INTRAVENOUS | Status: DC | PRN
  Administered 2020-04-17: 100 mg via INTRAVENOUS

## 2020-04-17 MED ORDER — ACETAMINOPHEN 500 MG PO TABS
1000.0000 mg | ORAL_TABLET | Freq: Four times a day (QID) | ORAL | Status: AC
  Administered 2020-04-18 (×4): 1000 mg via ORAL
  Filled 2020-04-17 (×5): qty 2

## 2020-04-17 MED ORDER — ONDANSETRON HCL 4 MG/2ML IJ SOLN
INTRAMUSCULAR | Status: DC | PRN
  Administered 2020-04-17: 4 mg via INTRAVENOUS

## 2020-04-17 MED ORDER — CEFAZOLIN SODIUM-DEXTROSE 2-4 GM/100ML-% IV SOLN
2.0000 g | INTRAVENOUS | Status: AC
  Administered 2020-04-17: 2 g via INTRAVENOUS
  Filled 2020-04-17: qty 100

## 2020-04-17 MED ORDER — CEFAZOLIN SODIUM-DEXTROSE 2-4 GM/100ML-% IV SOLN
2.0000 g | Freq: Four times a day (QID) | INTRAVENOUS | Status: AC
  Administered 2020-04-17 – 2020-04-18 (×2): 2 g via INTRAVENOUS
  Filled 2020-04-17 (×2): qty 100

## 2020-04-17 MED ORDER — BUPIVACAINE-EPINEPHRINE 0.5% -1:200000 IJ SOLN
INTRAMUSCULAR | Status: AC
  Filled 2020-04-17: qty 1

## 2020-04-17 MED ORDER — MIDAZOLAM HCL 2 MG/2ML IJ SOLN
INTRAMUSCULAR | Status: AC
  Filled 2020-04-17: qty 2

## 2020-04-17 MED ORDER — ENOXAPARIN SODIUM 30 MG/0.3ML ~~LOC~~ SOLN
30.0000 mg | Freq: Two times a day (BID) | SUBCUTANEOUS | Status: DC
  Administered 2020-04-18 – 2020-04-20 (×5): 30 mg via SUBCUTANEOUS
  Filled 2020-04-17 (×5): qty 0.3

## 2020-04-17 MED ORDER — SODIUM CHLORIDE 0.9 % IV SOLN: INTRAVENOUS | Status: DC

## 2020-04-17 MED ORDER — OXYCODONE HCL 5 MG PO TABS
5.0000 mg | ORAL_TABLET | ORAL | Status: DC | PRN
  Administered 2020-04-17: 10 mg via ORAL
  Administered 2020-04-18: 5 mg via ORAL
  Administered 2020-04-18: 10 mg via ORAL
  Administered 2020-04-19: 5 mg via ORAL
  Administered 2020-04-20: 10 mg via ORAL
  Filled 2020-04-17: qty 2
  Filled 2020-04-17 (×2): qty 1
  Filled 2020-04-17 (×2): qty 2

## 2020-04-17 MED ORDER — ROCURONIUM BROMIDE 10 MG/ML (PF) SYRINGE
PREFILLED_SYRINGE | INTRAVENOUS | Status: DC | PRN
  Administered 2020-04-17: 50 mg via INTRAVENOUS

## 2020-04-17 MED ORDER — CHLORHEXIDINE GLUCONATE 0.12 % MT SOLN
15.0000 mL | Freq: Once | OROMUCOSAL | Status: AC
  Administered 2020-04-17: 15 mL via OROMUCOSAL
  Filled 2020-04-17: qty 15

## 2020-04-17 MED ORDER — HYDROMORPHONE HCL 1 MG/ML IJ SOLN: 0.2500 mg | INTRAMUSCULAR | Status: DC | PRN

## 2020-04-17 MED ORDER — FENTANYL CITRATE (PF) 250 MCG/5ML IJ SOLN
INTRAMUSCULAR | Status: AC
  Filled 2020-04-17: qty 5

## 2020-04-17 MED ORDER — MIDAZOLAM HCL 5 MG/5ML IJ SOLN
INTRAMUSCULAR | Status: DC | PRN
  Administered 2020-04-17: 2 mg via INTRAVENOUS

## 2020-04-17 MED ORDER — LABETALOL HCL 5 MG/ML IV SOLN
INTRAVENOUS | Status: AC
  Filled 2020-04-17: qty 4

## 2020-04-17 MED ORDER — HYDROMORPHONE HCL 1 MG/ML IJ SOLN
0.5000 mg | INTRAMUSCULAR | Status: DC | PRN
  Administered 2020-04-20: 0.5 mg via INTRAVENOUS
  Filled 2020-04-17: qty 1

## 2020-04-17 MED ORDER — LABETALOL HCL 5 MG/ML IV SOLN
5.0000 mg | INTRAVENOUS | Status: AC | PRN
  Administered 2020-04-17 (×2): 5 mg via INTRAVENOUS

## 2020-04-17 MED ORDER — CHLORHEXIDINE GLUCONATE 4 % EX LIQD
60.0000 mL | Freq: Once | CUTANEOUS | Status: AC
  Administered 2020-04-17: 4 via TOPICAL

## 2020-04-17 MED ORDER — POVIDONE-IODINE 10 % EX SWAB: 2.0000 "application " | Freq: Once | CUTANEOUS | Status: DC

## 2020-04-17 MED ORDER — 0.9 % SODIUM CHLORIDE (POUR BTL) OPTIME
TOPICAL | Status: DC | PRN
  Administered 2020-04-17 (×2): 1000 mL

## 2020-04-17 MED ORDER — METHOCARBAMOL 1000 MG/10ML IJ SOLN
500.0000 mg | Freq: Four times a day (QID) | INTRAVENOUS | Status: DC | PRN
  Filled 2020-04-17: qty 5

## 2020-04-17 MED ORDER — METOCLOPRAMIDE HCL 5 MG PO TABS: 5.0000 mg | ORAL_TABLET | Freq: Three times a day (TID) | ORAL | Status: DC | PRN

## 2020-04-17 MED ORDER — MENTHOL 3 MG MT LOZG: 1.0000 | LOZENGE | OROMUCOSAL | Status: DC | PRN

## 2020-04-17 MED ORDER — METHOCARBAMOL 500 MG PO TABS
500.0000 mg | ORAL_TABLET | Freq: Four times a day (QID) | ORAL | Status: DC | PRN
  Administered 2020-04-17 – 2020-04-21 (×4): 500 mg via ORAL
  Filled 2020-04-17 (×3): qty 1

## 2020-04-17 MED ORDER — TRANEXAMIC ACID-NACL 1000-0.7 MG/100ML-% IV SOLN
1000.0000 mg | INTRAVENOUS | Status: AC
  Administered 2020-04-17: 1000 mg via INTRAVENOUS

## 2020-04-17 MED ORDER — FENTANYL CITRATE (PF) 100 MCG/2ML IJ SOLN
INTRAMUSCULAR | Status: DC | PRN
  Administered 2020-04-17: 50 ug via INTRAVENOUS
  Administered 2020-04-17: 100 ug via INTRAVENOUS
  Administered 2020-04-17: 50 ug via INTRAVENOUS

## 2020-04-17 MED ORDER — ONDANSETRON HCL 4 MG PO TABS: 4.0000 mg | ORAL_TABLET | Freq: Four times a day (QID) | ORAL | Status: DC | PRN

## 2020-04-17 MED ORDER — METHOCARBAMOL 500 MG PO TABS
ORAL_TABLET | ORAL | Status: AC
  Filled 2020-04-17: qty 1

## 2020-04-17 MED ORDER — LIDOCAINE 2% (20 MG/ML) 5 ML SYRINGE
INTRAMUSCULAR | Status: DC | PRN
  Administered 2020-04-17: 100 mg via INTRAVENOUS

## 2020-04-17 MED ORDER — DOCUSATE SODIUM 100 MG PO CAPS
100.0000 mg | ORAL_CAPSULE | Freq: Two times a day (BID) | ORAL | Status: DC
  Administered 2020-04-17 – 2020-04-20 (×6): 100 mg via ORAL
  Filled 2020-04-17 (×7): qty 1

## 2020-04-17 MED ORDER — PROPOFOL 10 MG/ML IV BOLUS
INTRAVENOUS | Status: AC
  Filled 2020-04-17: qty 20

## 2020-04-17 SURGICAL SUPPLY — 70 items
BANDAGE ESMARK 6X9 LF (GAUZE/BANDAGES/DRESSINGS) IMPLANT
BIT DRILL CALIBRATED 4.3MMX365 (DRILL) IMPLANT
BIT DRILL CROWE PNT TWST 4.5MM (DRILL) IMPLANT
BLADE CLIPPER SURG (BLADE) IMPLANT
BLADE SURG 15 STRL LF DISP TIS (BLADE) ×1 IMPLANT
BLADE SURG 15 STRL SS (BLADE) ×3
BNDG CMPR 9X6 STRL LF SNTH (GAUZE/BANDAGES/DRESSINGS)
BNDG COHESIVE 6X5 TAN STRL LF (GAUZE/BANDAGES/DRESSINGS) ×3 IMPLANT
BNDG ELASTIC 4X5.8 VLCR STR LF (GAUZE/BANDAGES/DRESSINGS) ×3 IMPLANT
BNDG ELASTIC 6X5.8 VLCR STR LF (GAUZE/BANDAGES/DRESSINGS) ×6 IMPLANT
BNDG ESMARK 6X9 LF (GAUZE/BANDAGES/DRESSINGS)
BNDG GAUZE ELAST 4 BULKY (GAUZE/BANDAGES/DRESSINGS) ×3 IMPLANT
COVER WAND RF STERILE (DRAPES) ×1 IMPLANT
CUFF TOURN SGL QUICK 34 (TOURNIQUET CUFF)
CUFF TOURN SGL QUICK 42 (TOURNIQUET CUFF) IMPLANT
CUFF TRNQT CYL 34X4.125X (TOURNIQUET CUFF) IMPLANT
DRAPE C-ARM 42X72 X-RAY (DRAPES) ×3 IMPLANT
DRAPE C-ARMOR (DRAPES) ×2 IMPLANT
DRAPE HALF SHEET 40X57 (DRAPES) ×6 IMPLANT
DRAPE IMP U-DRAPE 54X76 (DRAPES) ×6 IMPLANT
DRAPE INCISE IOBAN 66X45 STRL (DRAPES) ×4 IMPLANT
DRAPE ORTHO SPLIT 77X108 STRL (DRAPES) ×9
DRAPE SURG ORHT 6 SPLT 77X108 (DRAPES) ×3 IMPLANT
DRAPE U-SHAPE 47X51 STRL (DRAPES) ×3 IMPLANT
DRILL CALIBRATED 4.3MMX365 (DRILL) ×3
DRILL CROWE POINT TWIST 4.5MM (DRILL) ×3
DRSG TEGADERM 4X4.75 (GAUZE/BANDAGES/DRESSINGS) ×8 IMPLANT
DURAPREP 26ML APPLICATOR (WOUND CARE) ×5 IMPLANT
ELECT REM PT RETURN 9FT ADLT (ELECTROSURGICAL) ×3
ELECTRODE REM PT RTRN 9FT ADLT (ELECTROSURGICAL) ×1 IMPLANT
GAUZE SPONGE 4X4 12PLY STRL (GAUZE/BANDAGES/DRESSINGS) ×4 IMPLANT
GAUZE XEROFORM 1X8 LF (GAUZE/BANDAGES/DRESSINGS) ×3 IMPLANT
GAUZE XEROFORM 5X9 LF (GAUZE/BANDAGES/DRESSINGS) ×2 IMPLANT
GLOVE BIO SURGEON ST LM GN SZ9 (GLOVE) ×3 IMPLANT
GLOVE BIOGEL PI IND STRL 8 (GLOVE) ×1 IMPLANT
GLOVE BIOGEL PI INDICATOR 8 (GLOVE) ×6
GLOVE ECLIPSE 8.0 STRL XLNG CF (GLOVE) ×3 IMPLANT
GOWN STRL REUS W/ TWL LRG LVL3 (GOWN DISPOSABLE) ×2 IMPLANT
GOWN STRL REUS W/ TWL XL LVL3 (GOWN DISPOSABLE) ×1 IMPLANT
GOWN STRL REUS W/TWL LRG LVL3 (GOWN DISPOSABLE) ×6
GOWN STRL REUS W/TWL XL LVL3 (GOWN DISPOSABLE) ×3
GUIDEPIN 3.2X17.5 THRD DISP (PIN) ×2 IMPLANT
GUIDEWIRE BEAD TIP (WIRE) ×2 IMPLANT
IMMOBILIZER KNEE 22 UNIV (SOFTGOODS) ×2 IMPLANT
KIT BASIN OR (CUSTOM PROCEDURE TRAY) ×3 IMPLANT
KIT TURNOVER KIT B (KITS) ×3 IMPLANT
MANIFOLD NEPTUNE II (INSTRUMENTS) ×3 IMPLANT
NAIL FEM RETRO 13.5X380 (Nail) ×2 IMPLANT
NEEDLE 22X1 1/2 (OR ONLY) (NEEDLE) ×3 IMPLANT
NS IRRIG 1000ML POUR BTL (IV SOLUTION) ×3 IMPLANT
PACK GENERAL/GYN (CUSTOM PROCEDURE TRAY) ×3 IMPLANT
PACK UNIVERSAL I (CUSTOM PROCEDURE TRAY) IMPLANT
PAD ARMBOARD 7.5X6 YLW CONV (MISCELLANEOUS) ×6 IMPLANT
SCREW CORT TI DBL LEAD 5X50 (Screw) ×2 IMPLANT
SCREW CORT TI DBL LEAD 5X56 (Screw) ×2 IMPLANT
SCREW CORT TI DBL LEAD 5X70 (Screw) ×2 IMPLANT
SCREW CORT TI DBL LEAD 5X75 (Screw) ×3 IMPLANT
SCREW CORT TI DBLE LEAD 5X28 (Screw) ×2 IMPLANT
STAPLER VISISTAT 35W (STAPLE) ×3 IMPLANT
STOCKINETTE IMPERVIOUS LG (DRAPES) ×3 IMPLANT
SUT ETHILON 3 0 PS 1 (SUTURE) ×12 IMPLANT
SUT VIC AB 0 CT1 27 (SUTURE) ×9
SUT VIC AB 0 CT1 27XBRD ANBCTR (SUTURE) ×2 IMPLANT
SUT VIC AB 2-0 CT1 27 (SUTURE) ×6
SUT VIC AB 2-0 CT1 TAPERPNT 27 (SUTURE) IMPLANT
SUT VIC AB 2-0 CTB1 (SUTURE) ×6 IMPLANT
SYR CONTROL 10ML LL (SYRINGE) ×3 IMPLANT
TOWEL GREEN STERILE (TOWEL DISPOSABLE) ×3 IMPLANT
TOWEL GREEN STERILE FF (TOWEL DISPOSABLE) ×3 IMPLANT
WATER STERILE IRR 1000ML POUR (IV SOLUTION) ×3 IMPLANT

## 2020-04-17 NOTE — Anesthesia Preprocedure Evaluation (Signed)
Anesthesia Evaluation  Patient identified by MRN, date of birth, ID band Patient awake    Reviewed: Allergy & Precautions, NPO status , Patient's Chart, lab work & pertinent test results  Airway Mallampati: II  TM Distance: >3 FB Neck ROM: Full    Dental no notable dental hx.    Pulmonary neg pulmonary ROS,    Pulmonary exam normal breath sounds clear to auscultation       Cardiovascular hypertension, Normal cardiovascular exam Rhythm:Regular Rate:Normal     Neuro/Psych negative neurological ROS  negative psych ROS   GI/Hepatic Neg liver ROS, GERD  ,  Endo/Other  negative endocrine ROS  Renal/GU negative Renal ROS  negative genitourinary   Musculoskeletal negative musculoskeletal ROS (+)   Abdominal   Peds negative pediatric ROS (+)  Hematology  (+) anemia ,   Anesthesia Other Findings   Reproductive/Obstetrics negative OB ROS                             Anesthesia Physical Anesthesia Plan  ASA: III  Anesthesia Plan: General   Post-op Pain Management:    Induction: Intravenous  PONV Risk Score and Plan: 3 and Ondansetron, Dexamethasone and Treatment may vary due to age or medical condition  Airway Management Planned: Oral ETT  Additional Equipment:   Intra-op Plan:   Post-operative Plan: Extubation in OR  Informed Consent: I have reviewed the patients History and Physical, chart, labs and discussed the procedure including the risks, benefits and alternatives for the proposed anesthesia with the patient or authorized representative who has indicated his/her understanding and acceptance.     Dental advisory given  Plan Discussed with: CRNA and Surgeon  Anesthesia Plan Comments:         Anesthesia Quick Evaluation

## 2020-04-17 NOTE — Transfer of Care (Signed)
Immediate Anesthesia Transfer of Care Note  Patient: Angel Hebert  Procedure(s) Performed: INTRAMEDULLARY (IM) RETROGRADE FEMORAL NAILING (Left Leg Upper)  Patient Location: PACU  Anesthesia Type:General  Level of Consciousness: awake, alert , oriented and patient cooperative  Airway & Oxygen Therapy: Patient Spontanous Breathing  Post-op Assessment: Report given to RN and Post -op Vital signs reviewed and stable  Post vital signs: Reviewed and stable  Last Vitals:  Vitals Value Taken Time  BP 173/110 04/17/20 2021  Temp    Pulse 104 04/17/20 2022  Resp 14 04/17/20 2022  SpO2 99 % 04/17/20 2022  Vitals shown include unvalidated device data.  Last Pain:  Vitals:   04/17/20 1402  TempSrc: Oral  PainSc:       Patients Stated Pain Goal: 2 (83/07/46 0029)  Complications: No complications documented.

## 2020-04-17 NOTE — Anesthesia Procedure Notes (Signed)
Procedure Name: Intubation Date/Time: 04/17/2020 5:52 PM Performed by: Babs Bertin, CRNA Pre-anesthesia Checklist: Patient identified, Emergency Drugs available, Suction available and Patient being monitored Patient Re-evaluated:Patient Re-evaluated prior to induction Oxygen Delivery Method: Circle System Utilized Preoxygenation: Pre-oxygenation with 100% oxygen Induction Type: IV induction Ventilation: Mask ventilation without difficulty Laryngoscope Size: Mac and 3 Grade View: Grade I Tube type: Oral Tube size: 7.0 mm Number of attempts: 1 Airway Equipment and Method: Stylet and Oral airway Placement Confirmation: ETT inserted through vocal cords under direct vision,  positive ETCO2 and breath sounds checked- equal and bilateral Secured at: 21 cm Tube secured with: Tape Dental Injury: Teeth and Oropharynx as per pre-operative assessment

## 2020-04-17 NOTE — Plan of Care (Signed)
  Problem: Pain Managment: Goal: General experience of comfort will improve Outcome: Progressing   Problem: Safety: Goal: Ability to remain free from injury will improve Outcome: Progressing   Problem: Skin Integrity: Goal: Risk for impaired skin integrity will decrease Outcome: Progressing   

## 2020-04-17 NOTE — Progress Notes (Signed)
Patient stable.  Has left knee distal femur fracture periprosthetic around a well fixed component.  Plan at this time based on CT scanning his retrograde IM nail.  Risk and benefits are discussed with the patient including not limited to infection nerve vessel damage potential for malunion nonunion which would then require further surgery.  She is on the young side for total femoral replacement.  Patient understands the risk benefits and wishes to proceed.  This will also require.  Of nonweightbearing as well as immobilization.

## 2020-04-17 NOTE — Brief Op Note (Signed)
   04/17/2020  8:22 PM  PATIENT:  Angel Hebert  64 y.o. female  PRE-OPERATIVE DIAGNOSIS:  Fractured Left Femur  POST-OPERATIVE DIAGNOSIS:  Fractured Left Femur  PROCEDURE:  Procedure(s): INTRAMEDULLARY (IM) RETROGRADE FEMORAL NAILING  SURGEON:  Surgeon(s): Marlou Sa, Tonna Corner, MD  ASSISTANT: magnant pa  ANESTHESIA:   general  EBL: 50 ml    Total I/O In: -  Out: 50 [Blood:50]  BLOOD ADMINISTERED: none  DRAINS: none   LOCAL MEDICATIONS USED:  Marcaine mso4 clonidine  SPECIMEN:  No Specimen  COUNTS:  YES  TOURNIQUET:  * No tourniquets in log *  DICTATION: .Other Dictation: Dictation Number [id number]  PLAN OF CARE: Admit to inpatient   PATIENT DISPOSITION:  PACU - hemodynamically stable

## 2020-04-18 ENCOUNTER — Encounter (HOSPITAL_COMMUNITY): Payer: Self-pay | Admitting: Orthopedic Surgery

## 2020-04-18 LAB — BASIC METABOLIC PANEL
Anion gap: 13 (ref 5–15)
BUN: 9 mg/dL (ref 8–23)
CO2: 20 mmol/L — ABNORMAL LOW (ref 22–32)
Calcium: 8.7 mg/dL — ABNORMAL LOW (ref 8.9–10.3)
Chloride: 102 mmol/L (ref 98–111)
Creatinine, Ser: 0.79 mg/dL (ref 0.44–1.00)
GFR calc Af Amer: >60 mL/min (ref >60)
GFR calc non Af Amer: >60 mL/min (ref >60)
Glucose, Bld: 183 mg/dL — ABNORMAL HIGH (ref 70–99)
Potassium: 3.4 mmol/L — ABNORMAL LOW (ref 3.5–5.1)
Sodium: 135 mmol/L (ref 135–145)

## 2020-04-18 LAB — CBC
HCT: 25.9 % — ABNORMAL LOW (ref 36.0–46.0)
Hemoglobin: 8.2 g/dL — ABNORMAL LOW (ref 12.0–15.0)
MCH: 31.2 pg (ref 26.0–34.0)
MCHC: 31.7 g/dL (ref 30.0–36.0)
MCV: 98.5 fL (ref 80.0–100.0)
Platelets: 195 10*3/uL (ref 150–400)
RBC: 2.63 MIL/uL — ABNORMAL LOW (ref 3.87–5.11)
RDW: 18.8 % — ABNORMAL HIGH (ref 11.5–15.5)
WBC: 7.8 10*3/uL (ref 4.0–10.5)
nRBC: 0.6 % — ABNORMAL HIGH (ref 0.0–0.2)

## 2020-04-18 NOTE — Progress Notes (Signed)
Patient is a 64 year old female presents s/p left femur intramedullary nail on 8/16 for left femur periprosthetic fracture.  Patient has history of left total knee arthroplasty in 2018.  Today, patient reports that she is doing well and her pain is controlled.  She has maintained her nonweightbearing status.  She had physical therapy earlier today and was able to stand with a walker while maintaining her nonweightbearing status but she was unable to ambulate.  She lives in a hotel with her boyfriend and states that she drinks 80 ounces of beer per day.  She states she has stopped drinking since her fall.  On exam, dressings are intact.  Left foot is warm and well-perfused.  Dorsiflexion and plantar flexion are intact.  Ace wrap removed and knee immobilizer in place.  Plan for discharge to skilled nursing facility in the coming days.  Follow-up with Dr. Marlou Sa in clinic 7 to 10 days following procedure.

## 2020-04-18 NOTE — [place] FL2 (Signed)
[place] MEDICAID FL2 LEVEL OF CARE SCREENING TOOL     IDENTIFICATION  Patient Name: Angel Hebert Birthdate: 1956-08-27 Sex: female Admission Date (Current Location): 04/16/2020  [facility] and [place] Number:  Herbalist and Address:  [facility] [place]. [facility], [place] [address], [place], [place] [address]      Provider Number: [id number]  Attending Physician Name and Address:  Meredith Pel, MD  Relative Name and Phone Number:  Louretta Parma - sister - [phone]    Current Level of Care: Hospital Recommended Level of Care: [place] Prior Approval Number:    Date Approved/Denied:   PASRR Number: [id number]  Discharge Plan: SNF    Current Diagnoses: Patient Active Problem List   Diagnosis Date Noted  . Periprosthetic fracture around internal prosthetic left knee joint 04/17/2020  . Closed fracture of left distal femur ([place]) 04/16/2020  . Symptomatic anemia 07/12/2019  . Lobar pneumonia ([place]) 07/12/2019  . Insomnia 03/03/2017  . 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 Arthritis of knee 10/01/2016  . Physical deconditioning   . Anemia, iron deficiency   . Pressure ulcer 10/14/2015  . CAP (community acquired pneumonia)   . Sepsis due to Streptococcus pneumoniae ([place])   . Hyperlipidemia 03/02/2007  . Essential hypertension 03/02/2007  . Allergic rhinitis 03/02/2007  . Asthma 03/02/2007  . GERD 03/02/2007    Orientation RESPIRATION BLADDER Height & Weight     Self, Time, Situation, Place  Normal Continent Weight: 63.5 kg Height:  5\' 7"  (170.2 cm)  BEHAVIORAL SYMPTOMS/MOOD NEUROLOGICAL BOWEL NUTRITION STATUS      Continent Diet (See Discharge Summary)  AMBULATORY STATUS COMMUNICATION OF NEEDS Skin   Extensive Assist Verbally Surgical wounds                       Personal Care Assistance Level of Assistance  Bathing, Dressing Bathing Assistance: Limited assistance   Dressing Assistance:  Limited assistance     Functional Limitations Info  Sight, Hearing, Speech Sight Info: Adequate Hearing Info: Adequate Speech Info: Adequate    SPECIAL CARE FACTORS FREQUENCY  PT (By licensed PT), OT (By licensed OT)     PT Frequency: 5 x per week OT Frequency: 5 x per week            Contractures Contractures Info: Not present    Additional Factors Info  Code Status, Allergies, Psychotropic Code Status Info: Full code Allergies Info: NKDA Psychotropic Info: Ativan         Current Medications (04/18/2020):  This is the current hospital active medication list Current Facility-Administered Medications  Medication Dose Route Frequency Provider Last Rate Last Admin  . acetaminophen (TYLENOL) tablet 1,000 mg  1,000 mg Oral Q6H Magnant, Charles L, PA-C   1,000 mg at 04/18/20 1219  . docusate sodium (COLACE) capsule 100 mg  100 mg Oral BID Magnant, Charles L, PA-C   100 mg at 04/18/20 0816  . enoxaparin (LOVENOX) injection 30 mg  30 mg Subcutaneous Q12H Magnant, Charles L, PA-C   30 mg at 04/18/20 0816  . HYDROmorphone (DILAUDID) injection 0.5 mg  0.5 mg Intravenous Q4H PRN Magnant, Charles L, PA-C      . lactated ringers infusion   Intravenous Continuous Myrtie Soman, MD 10 mL/hr at 04/17/20 1743 New Bag at 04/17/20 1818  . lactated ringers infusion   Intravenous Continuous Donella Stade, PA-C 75 mL/hr at 04/18/20 0104 New Bag at 04/18/20 0104  .  LORazepam (ATIVAN) injection 0-4 mg  0-4 mg Intravenous Q6H Little, Wenda Overland, MD       Or  . LORazepam (ATIVAN) tablet 0-4 mg  0-4 mg Oral Q6H Little, Wenda Overland, MD      . Derrill Memo ON 04/19/2020] LORazepam (ATIVAN) injection 0-4 mg  0-4 mg Intravenous Q12H Little, Wenda Overland, MD       Or  . Derrill Memo ON 04/19/2020] LORazepam (ATIVAN) tablet 0-4 mg  0-4 mg Oral Q12H Little, Wenda Overland, MD      . menthol-cetylpyridinium (CEPACOL) lozenge 3 mg  1 lozenge Oral PRN Magnant, Charles L, PA-C       Or  . phenol (CHLORASEPTIC)  mouth spray 1 spray  1 spray Mouth/Throat PRN Magnant, Charles L, PA-C      . methocarbamol (ROBAXIN) tablet 500 mg  500 mg Oral Q6H PRN Magnant, Charles L, PA-C   500 mg at 04/18/20 1219   Or  . methocarbamol (ROBAXIN) 500 mg in dextrose 5 % 50 mL IVPB  500 mg Intravenous Q6H PRN Magnant, Charles L, PA-C      . metoCLOPramide (REGLAN) tablet 5-10 mg  5-10 mg Oral Q8H PRN Magnant, Charles L, PA-C       Or  . metoCLOPramide (REGLAN) injection 5-10 mg  5-10 mg Intravenous Q8H PRN Magnant, Charles L, PA-C      . morphine 2 MG/ML injection 2 mg  2 mg Intravenous Q1H PRN Marybelle Killings, MD   2 mg at 04/17/20 1240  . ondansetron (ZOFRAN) tablet 4 mg  4 mg Oral Q6H PRN Magnant, Charles L, PA-C       Or  . ondansetron (ZOFRAN) injection 4 mg  4 mg Intravenous Q6H PRN Magnant, Charles L, PA-C      . oxyCODONE (Oxy IR/ROXICODONE) immediate release tablet 5-10 mg  5-10 mg Oral Q4H PRN Magnant, Charles L, PA-C   10 mg at 04/18/20 0816  . oxyCODONE-acetaminophen (PERCOCET/ROXICET) 5-325 MG per tablet 1-2 tablet  1-2 tablet Oral Q4H PRN Marybelle Killings, MD   2 tablet at 04/16/20 2250  . thiamine tablet 100 mg  100 mg Oral Daily Little, Wenda Overland, MD   100 mg at 04/18/20 9622   Or  . thiamine (B-1) injection 100 mg  100 mg Intravenous Daily Little, Wenda Overland, MD   100 mg at 04/17/20 2979     Discharge Medications: Please see discharge summary for a list of discharge medications.  Relevant Imaging Results:  Relevant Lab Results:   Additional Information SS# [id number]  Curlene Labrum, RN

## 2020-04-18 NOTE — Plan of Care (Signed)
  Problem: Activity: Goal: Risk for activity intolerance will decrease Outcome: Progressing   Problem: Pain Managment: Goal: General experience of comfort will improve Outcome: Progressing   

## 2020-04-18 NOTE — Evaluation (Signed)
Physical Therapy Evaluation Patient Details Name: Angel Hebert MRN: [id number] DOB: Sep 27, 1955 Today's Date: 04/18/2020   History of Present Illness  Pt is a 64 y.o. female admitted 04/16/20 after fall sustaining L distal femur fx above TKA, s/p L femur IMN 8/16. PMH includes HTN, HLD, asthma, alcohol use, L TKA (2018).    Clinical Impression  Pt presents with an overall decrease in functional mobility secondary to above. PTA, pt independent, lives in [place] with boyfriend, sister provides transportation. Educ on precautions, positioning, KI use, and importance of mobility. Today, pt able to initiate standing mobility with RW and modA; pt unable to maintain LLE NWB precautions this session despite max cues and education. Pt would benefit from continued acute PT services to maximize functional mobility and independence prior to d/c with SNF-level therapies.     Follow Up Recommendations SNF;Supervision for mobility/OOB    Equipment Recommendations  Rolling walker with 5" wheels;3in1 (PT);Wheelchair (measurements PT);Wheelchair cushion (measurements PT)    Recommendations for Other Services       Precautions / Restrictions Precautions Precautions: Fall Required Braces or Orthoses: Knee Immobilizer - Left Knee Immobilizer - Left: On at all times Restrictions Weight Bearing Restrictions: Yes LLE Weight Bearing: Non weight bearing      Mobility  Bed Mobility Overal bed mobility: Needs Assistance Bed Mobility: Supine to Sit     Supine to sit: Supervision;HOB elevated        Transfers Overall transfer level: Needs assistance Equipment used: Rolling walker (2 wheeled) Transfers: Sit to/from Stand Sit to Stand: Mod assist;From elevated surface         General transfer comment: Cues for hand placement, modA fro trunk elevation, pt with difficulty maintaining LLE NWB  Ambulation/Gait Ambulation/Gait assistance: Mod assist Gait Distance (Feet): 1 Feet Assistive device:  Rolling walker (2 wheeled) Gait Pattern/deviations: Step-to pattern;Decreased weight shift to left;Trunk flexed;Antalgic     General Gait Details: Pivotal steps from bed to recliner with RW and modA, pt unable to hop on RLE in order to maintain LLE NWB despite cues; deferred further distance secondary to this  Stairs            Wheelchair Mobility    Modified Rankin (Stroke Patients Only)       Balance Overall balance assessment: Needs assistance   Sitting balance-Leahy Scale: Good       Standing balance-Leahy Scale: Poor Standing balance comment: Reliant on BUE support                             Pertinent Vitals/Pain Pain Assessment: 0-10 Pain Score: 5  Pain Location: LLE Pain Descriptors / Indicators: Discomfort;Grimacing;Guarding Pain Intervention(s): Monitored during session;Ice applied    Home Living Family/patient expects to be discharged to:: Other (Comment) (Motel room) Living Arrangements: Spouse/significant other (Boyfriend) Available Help at Discharge: Family;Available 24 hours/day Type of Home: Other(Comment) (hotel room) Home Access: Level entry     Home Layout: One level Home Equipment: None Additional Comments: Lives in [place] room with boyfriend. Neither drive - sister provides transportation    Prior Function Level of Independence: Independent         Comments: Independent without DME. Reports she washes up at sink     Hand Dominance        Extremity/Trunk Assessment   Upper Extremity Assessment Upper Extremity Assessment: Overall WFL for tasks assessed    Lower Extremity Assessment Lower Extremity Assessment: LLE deficits/detail LLE Deficits / Details: s/p  L femur IMN; in knee immobilizer at all times (knee not tested), ankle DF/PF at least 3/5, hip flex <3/5, abd/add at least 3/5 LLE: Unable to fully assess due to immobilization;Unable to fully assess due to pain LLE Coordination: decreased gross motor        Communication   Communication: No difficulties  Cognition Arousal/Alertness: Awake/alert Behavior During Therapy: WFL for tasks assessed/performed Overall Cognitive Status: Within Functional Limits for tasks assessed                                 General Comments: WFL for simple tasks, not formally assessed      General Comments General comments (skin integrity, edema, etc.): KI removed to adjust ice pack, LLE with ace wrap, KI replaced and LLE elevated on pillow with knee straight    Exercises     Assessment/Plan    PT Assessment Patient needs continued PT services  PT Problem List Decreased strength;Decreased range of motion;Decreased activity tolerance;Decreased balance;Decreased mobility;Decreased knowledge of use of DME;Decreased knowledge of precautions;Pain       PT Treatment Interventions DME instruction;Gait training;Functional mobility training;Therapeutic activities;Therapeutic exercise;Balance training;Patient/family education;Wheelchair mobility training    PT Goals (Current goals can be found in the Care Plan section)  Acute Rehab PT Goals Patient Stated Goal: Interested in post-acute rehab services PT Goal Formulation: With patient Time For Goal Achievement: 05/02/20 Potential to Achieve Goals: Good    Frequency Min 3X/week   Barriers to discharge        Co-evaluation               AM-PAC PT "6 Clicks" Mobility  Outcome Measure Help needed turning from your back to your side while in a flat bed without using bedrails?: A Little Help needed moving from lying on your back to sitting on the side of a flat bed without using bedrails?: A Little Help needed moving to and from a bed to a chair (including a wheelchair)?: A Lot Help needed standing up from a chair using your arms (e.g., wheelchair or bedside chair)?: A Lot Help needed to walk in hospital room?: A Lot Help needed climbing 3-5 steps with a railing? : Total 6 Click Score:  13    End of Session Equipment Utilized During Treatment: Gait belt;Left knee immobilizer Activity Tolerance: Patient tolerated treatment well Patient left: in chair;with call bell/phone within reach;with chair alarm set Nurse Communication: Mobility status PT Visit Diagnosis: Other abnormalities of gait and mobility (R26.89);Pain;Difficulty in walking, not elsewhere classified (R26.2) Pain - Right/Left: Left Pain - part of body: Leg    Time: 9892-1194 PT Time Calculation (min) (ACUTE ONLY): 18 min   Charges:   PT Evaluation $PT Eval Moderate Complexity: 1 Mod        Mabeline Caras, PT, DPT Acute Rehabilitation Services  Pager [phone] Office [place] 04/18/2020, 9:06 AM

## 2020-04-18 NOTE — TOC CAGE-AID Note (Signed)
Transition of Care [facility]) - CAGE-AID Screening   Patient Details  Name: Angel Hebert MRN: [id number] Date of Birth: 22-Sep-1955  [facility]) CM/SW Contact:    Curlene Labrum, RN Phone Number: 04/18/2020, 2:20 PM   Clinical Narrative: Case management met with the patient who states that she drinks about 80 ounces of beer a day.  She said that she has quit drinking at this point.  Offered ETOH counseling - patient declined.   CAGE-AID Screening: Substance Abuse Screening unable to be completed due to: : Patient Refused  Have You Ever Felt You Ought to Cut Down on Your Drinking or Drug Use?: No Have People Annoyed You By Critizing Your Drinking Or Drug Use?: No Have You Felt Bad Or Guilty About Your Drinking Or Drug Use?: No Have You Ever Had a Drink or Used Drugs First Thing In The Morning to Steady Your Nerves or to Get Rid of a Hangover?: Yes CAGE-AID Score: 1  Substance Abuse Education Offered: Yes  Substance abuse interventions: Patient Counseling

## 2020-04-18 NOTE — TOC Initial Note (Addendum)
Transition of Care [facility]) - Initial/Assessment Note    Patient Details  Name: Angel Hebert MRN: [id number] Date of Birth: 1955/10/07  Transition of Care [facility]) CM/SW Contact:    Curlene Labrum, RN Phone Number: 04/18/2020, 2:10 PM  Clinical Narrative:                 Case management met with the patient regarding transitions of care S/P Left distal femur fracture and IM Nail on 04/17/20 by Dr. Marlou Sa.  The patient currently lives in a [place] with her boyfriend and fell at home - history of recent alcohol use of 80 ounces of beer per day consumption.  The patient is refusing SNF placement and would like home health to be provided but I am unable to find home health agency support due to the patient's payor source, alcohol abuse and living circumstances.  I spoke to the patient's sister who is going to speak with the patient about agreeing to [facility] placement - otherwise - patient would be provided with outpatient services for PT and 24 supervision with boyfriend at the hotel.  SNF placement would be preferable if the patient agrees.  I will followup with the patient and sister after family agrees to speak with the patient regarding her best option of SNf placement. Regardless, if patient chooses to go home - Outpatient referral will be placed with sister to provide transportation and dme needs would include rolling walker and wheelchair.  8/17 1615 - I spoke with the patient's sister and the patient and encouraged the patient to be placed in a SNF facility.  The patient's sister states that she would have difficulty providing care and transportation for the patient to get to outpatient PT and would like the patient to go to a SNF even for a few weeks.  I spoke with the patient and the patient is now agreeable to [facility] placement.  The sister states she would like the patient to go to [facility] if possible.  SNF workup was completed and I will offer bed availability to the patient tomorrow  once offers are made - patient is medicaid.  Expected Discharge Plan: Skilled Nursing Facility Barriers to Discharge: Continued Medical Work up (Patient refusing SNF placement - but sister will be speaking with the patient to reason with her about SNF admission)   Patient Goals and CMS Choice Patient states their goals for this hospitalization and ongoing recovery are:: Patient refusing SNF placement - and requesting to go home with outpatient PT CMS Medicare.gov Compare Post Acute Care list provided to:: Patient Choice offered to / list presented to : Patient  Expected Discharge Plan and Services Expected Discharge Plan: [place]   Discharge Planning Services: CM Consult Post Acute Care Choice: [facility] placement versus outpatient PT)                                        Prior Living Arrangements/Services   Lives with:: Significant Other Patient language and need for interpreter reviewed:: Yes Do you feel safe going back to the place where you live?: Yes      Need for Family Participation in Patient Care: Yes (Comment) Care giver support system in place?: Yes (comment)   Criminal Activity/Legal Involvement Pertinent to Current Situation/Hospitalization: No - Comment as needed  Activities of Daily Living Home Assistive Devices/Equipment: None ADL Screening (condition at time of  admission) Patient's cognitive ability adequate to safely complete daily activities?: Yes Is the patient deaf or have difficulty hearing?: No Does the patient have difficulty seeing, even when wearing glasses/contacts?: No Does the patient have difficulty concentrating, remembering, or making decisions?: No Patient able to express need for assistance with ADLs?: Yes Does the patient have difficulty dressing or bathing?: No Independently performs ADLs?: Yes (appropriate for developmental age) Does the patient have difficulty walking or climbing stairs?:  No Weakness of Legs: Left Weakness of Arms/Hands: None  Permission Sought/Granted            Permission granted to share info w Relationship: sister - Suanne Marker     Emotional Assessment Appearance:: Appears stated age Attitude/Demeanor/Rapport: Gracious Affect (typically observed): Accepting Orientation: : Oriented to Self, Oriented to Place, Oriented to  Time, Oriented to Situation Alcohol / Substance Use: Alcohol Use Psych Involvement: No (comment)  Admission diagnosis:  Alcohol use [Z72.89] Closed fracture of left distal femur (H[facility]) [S72.402A] Closed fracture of distal end of left femur, unspecified fracture morphology, initial encounter ([place]) [S72.402A] Periprosthetic fracture around internal prosthetic left knee joint [M71.99OZ] Patient Active Problem List   Diagnosis Date Noted  . Periprosthetic fracture around internal prosthetic left knee joint 04/17/2020  . Closed fracture of left distal femur ([place]) 04/16/2020  . Symptomatic anemia 07/12/2019  . Lobar pneumonia ([place]) 07/12/2019  . Insomnia 03/03/2017  . 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 Arthritis of knee 10/01/2016  . Physical deconditioning   . Anemia, iron deficiency   . Pressure ulcer 10/14/2015  . CAP (community acquired pneumonia)   . Sepsis due to Streptococcus pneumoniae ([place])   . Hyperlipidemia 03/02/2007  . Essential hypertension 03/02/2007  . Allergic rhinitis 03/02/2007  . Asthma 03/02/2007  . GERD 03/02/2007   PCP:  Charlott Rakes, MD Pharmacy:   [id number] [place], [place]: [phone]: [address]Phone: [phone]: [phone]    Social Determinants of Health (SDOH) Interventions    Readmission Risk Interventions Readmission Risk Prevention Plan 04/18/2020   Post Dischage Appt Complete  Medication Screening Complete  Transportation Screening Complete  Some recent data might be hidden

## 2020-04-19 LAB — BASIC METABOLIC PANEL
Anion gap: 9 (ref 5–15)
BUN: 6 mg/dL — ABNORMAL LOW (ref 8–23)
CO2: 26 mmol/L (ref 22–32)
Calcium: 8.5 mg/dL — ABNORMAL LOW (ref 8.9–10.3)
Chloride: 99 mmol/L (ref 98–111)
Creatinine, Ser: 0.5 mg/dL (ref 0.44–1.00)
GFR calc Af Amer: >60 mL/min (ref >60)
GFR calc non Af Amer: >60 mL/min (ref >60)
Glucose, Bld: 88 mg/dL (ref 70–99)
Potassium: 2.9 mmol/L — ABNORMAL LOW (ref 3.5–5.1)
Sodium: 134 mmol/L — ABNORMAL LOW (ref 135–145)

## 2020-04-19 LAB — CBC
HCT: 21.8 % — ABNORMAL LOW (ref 36.0–46.0)
Hemoglobin: 7.2 g/dL — ABNORMAL LOW (ref 12.0–15.0)
MCH: 32.1 pg (ref 26.0–34.0)
MCHC: 33 g/dL (ref 30.0–36.0)
MCV: 97.3 fL (ref 80.0–100.0)
Platelets: 205 10*3/uL (ref 150–400)
RBC: 2.24 MIL/uL — ABNORMAL LOW (ref 3.87–5.11)
RDW: 18.6 % — ABNORMAL HIGH (ref 11.5–15.5)
WBC: 6.4 10*3/uL (ref 4.0–10.5)
nRBC: 0 % (ref 0.0–0.2)

## 2020-04-19 MED ORDER — LORAZEPAM 2 MG/ML IJ SOLN: 1.0000 mg | INTRAMUSCULAR | Status: DC | PRN

## 2020-04-19 NOTE — TOC Progression Note (Addendum)
Transition of Care [facility]) - Progression Note    Patient Details  Name: Angel Hebert MRN: [id number] Date of Birth: [birth year]  Transition of Care [facility]) CM/SW [place], RN Phone Number: 04/19/2020, 9:23 AM  Clinical Narrative:    Case Management noted no bed offers currently for placement - reached out to Harriet Pho, CM at [facility] for possible bed offer under the patient's [facility] payor source - waiting for return call.  Will follow for SNF. Placement.  8/18 1000 Sharyn Lull, primary nurse on Orthopedics states that the patient and boyfriend are refusing SNF placement this morning.  The patient states that she is unwilling to give up her Medicaid check for placement.  The boyfriend states that he works for a [employer] during the day but he has family and friends that can provide support during the day and transportation to appointments as needed.  I will attempt to find home health support if able, otherwise the patient will need to be set up with outpatient PT.  The boyfriend confirms that he can provide care and transportation for the patient as needed.  04/19/20 1115 - Received multiple declines for care from [place] at [place], [place], [place] - all declined services due to staffing and patient's alcohol abuse for home health services.  A referral was placed for outpatient PT/OT.  I will order dme for the patient including WC, RW, and 3:1 from [employer].   Expected Discharge Plan: Skilled Nursing Facility Barriers to Discharge: Continued Medical Work up (Patient refusing SNF placement - but sister will be speaking with the patient to reason with her about SNF admission)  Expected Discharge Plan and Services Expected Discharge Plan: [place]   Discharge Planning Services: CM Consult Post Acute Care Choice: [facility] placement versus outpatient PT)                                          Social Determinants of Health (SDOH) Interventions    Readmission Risk Interventions Readmission Risk Prevention Plan 04/18/2020  Post Dischage Appt Complete  Medication Screening Complete  Transportation Screening Complete  Some recent data might be hidden

## 2020-04-19 NOTE — Plan of Care (Signed)
  Problem: Education: Goal: Knowledge of General Education information will improve Description: Including pain rating scale, medication(s)/side effects and non-pharmacologic comfort measures Outcome: Progressing   Problem: Health Behavior/Discharge Planning: Goal: Ability to manage health-related needs will improve Outcome: Progressing   Problem: Nutrition: Goal: Adequate nutrition will be maintained Outcome: Progressing   Problem: Coping: Goal: Level of anxiety will decrease Outcome: Progressing   Problem: Safety: Goal: Ability to remain free from injury will improve Outcome: Progressing   Problem: Skin Integrity: Goal: Risk for impaired skin integrity will decrease Outcome: Progressing   

## 2020-04-19 NOTE — Plan of Care (Signed)

## 2020-04-19 NOTE — Progress Notes (Addendum)
Pt stable Pain ok On lovenox for dvt prophylaxis hgb 7.2 but not clinically symptomatic ready for dc to snf

## 2020-04-19 NOTE — Care Management (Signed)
°  °  Durable Medical Equipment  (From admission, onward)         Start     Ordered   04/19/20 1131  For home use only DME Walker rolling  Once       Question Answer Comment  Walker: With [place] Wheels   Patient needs a walker to treat with the following condition Closed fracture of left distal femur ([place])      04/19/20 1132   04/19/20 1131  For home use only DME 3 n 1  Once        04/19/20 1132   04/19/20 1126  For home use only DME standard manual wheelchair with seat cushion  Once       Comments: Patient suffers from Left IM Nail distal femur which impairs their ability to perform daily activities like [id number] in the home.  A walking [id number] will not resolve issue with performing activities of daily living. A wheelchair will allow patient to safely perform daily activities. Patient can safely propel the wheelchair in the home or has a caregiver who can provide assistance. Length of need 6 months. Accessories: elevating leg rests (ELRs), wheel locks, extensions and anti-tippers.   04/19/20 1132

## 2020-04-19 NOTE — Plan of Care (Signed)
  Problem: Health Behavior/Discharge Planning: Goal: Ability to manage health-related needs will improve Outcome: Progressing   Problem: Pain Managment: Goal: General experience of comfort will improve Outcome: Progressing   

## 2020-04-20 LAB — TYPE AND SCREEN
ABO/RH(D): B POS
Antibody Screen: NEGATIVE
Unit division: 0
Unit division: 0

## 2020-04-20 LAB — CBC
HCT: 19.3 % — ABNORMAL LOW (ref 36.0–46.0)
Hemoglobin: 6.3 g/dL — CL (ref 12.0–15.0)
MCH: 30.9 pg (ref 26.0–34.0)
MCHC: 32.6 g/dL (ref 30.0–36.0)
MCV: 94.6 fL (ref 80.0–100.0)
Platelets: 183 10*3/uL (ref 150–400)
RBC: 2.04 MIL/uL — ABNORMAL LOW (ref 3.87–5.11)
RDW: 18.6 % — ABNORMAL HIGH (ref 11.5–15.5)
WBC: 5.1 10*3/uL (ref 4.0–10.5)
nRBC: 0.6 % — ABNORMAL HIGH (ref 0.0–0.2)

## 2020-04-20 LAB — BPAM RBC
Blood Product Expiration Date: 202109162359
Blood Product Expiration Date: 202109172359
Unit Type and Rh: 7300
Unit Type and Rh: 7300

## 2020-04-20 LAB — PREPARE RBC (CROSSMATCH)

## 2020-04-20 MED ORDER — SODIUM CHLORIDE 0.9% IV SOLUTION: Freq: Once | INTRAVENOUS | Status: DC

## 2020-04-20 MED ORDER — OXYCODONE HCL 5 MG PO TABS: 5.0000 mg | ORAL_TABLET | Freq: Four times a day (QID) | ORAL | Status: DC | PRN

## 2020-04-20 NOTE — Progress Notes (Addendum)
Patient is adamantly refusing to go to skilled nursing. Physical therapy is recommending skilled nursing to maximize functional mobility and independence That is our recommendation as well and we discussed this at length; however, she is equally firm in her believe that she wants to go home.  The problem with going home is that I think it is possible that if she does not follow strict nonweight bearing restrictions and stay in that knee immobilizer that the fracture could become malaligned.  If it becomes malaligned more surgery to be required which may or may not be able to restore her functional ability.  Worse case scenario is she would have to undergo more surgery such as distal femur replacement or repeat open reduction internal fixation which in the end could be complicated by infection and eventual limb loss.  Despite these admonitions she does want to go home.  We will plan to do that however it is against our medical advice and we do recommend that she go to skilled nursing instead.  Patient understands the risk and benefits of her decision.  We will plan for discharge tomorrow morning after blood transfusion.  Hemoglobin in the 6 range this morning.   Hold Lovenox tonight.  We will also decrease her pain medicine frequency.  She is fairly sedated at this time per my conversation with Mia [place].

## 2020-04-20 NOTE — Progress Notes (Signed)
Physical Therapy Treatment Patient Details Name: Angel Hebert MRN: [id number] DOB: [birth year] Today's Date: 04/20/2020    History of Present Illness Pt is a 64 y.o. female admitted 04/16/20 after fall sustaining L distal femur fx above TKA, s/p L femur IMN 8/16. PMH includes HTN, HLD, asthma, alcohol use, L TKA (2018).   PT Comments    Pt declining SNF-level therapies, planning to return to hotel with family/friend assist. Pt requires assist for standing mobility and transfers, not attempting to maintain LLE NWB precautions despite max education and cues for sequencing. Pt with increased confusion and disorientation this session; very poor attention with difficulty following any education. Continue to recommend SNF to maximize functional mobility and independence.    Follow Up Recommendations  SNF;Supervision for mobility/OOB (pt declined)     Equipment Recommendations  Rolling walker with 5" wheels;3in1 (PT);Wheelchair (measurements PT);Wheelchair cushion (measurements PT)    Recommendations for Other Services       Precautions / Restrictions Precautions Precautions: Fall Required Braces or Orthoses: Knee Immobilizer - Left Knee Immobilizer - Left: On at all times Restrictions Weight Bearing Restrictions: Yes LLE Weight Bearing: Non weight bearing    Mobility  Bed Mobility Overal bed mobility: Needs Assistance Bed Mobility: Supine to Sit     Supine to sit: Supervision;HOB elevated     General bed mobility comments: Use of BUEs to assist LLE to EOB; pt easily distracted by items on bed requiring frequent redirection to task  Transfers Overall transfer level: Needs assistance Equipment used: Standard walker Transfers: Sit to/from Stand;Stand Pivot Transfers Sit to Stand: Min assist Stand pivot transfers: Min assist       General transfer comment: Cues for hand placement and sequencing as pt pushing RW away initially; minA for trunk elevation, pt not attempting  to keep LLE NWB despite max cues and education; pt starting to take steps, therefore assisted pivot to recliner to prevent further WB through LLE  Ambulation/Gait             General Gait Details: unable without WB - pt not following precautions despite max cues and education   Theme park manager mobility:  (Pt's new w/c in room - attempted to initiate education, but pt unable to focus on this)  Modified Rankin (Stroke Patients Only)       Balance Overall balance assessment: Needs assistance   Sitting balance-Leahy Scale: Fair       Standing balance-Leahy Scale: Poor Standing balance comment: Reliant on BUE support                            Cognition Arousal/Alertness: Awake/alert Behavior During Therapy: WFL for tasks assessed/performed Overall Cognitive Status: No family/caregiver present to determine baseline cognitive functioning Area of Impairment: Orientation;Attention;Memory;Following commands;Safety/judgement;Awareness;Problem solving                 Orientation Level: Disoriented to;Place;Time Current Attention Level: Focused;Sustained Memory: Decreased recall of precautions;Decreased short-term memory Following Commands: Follows one step commands inconsistently Safety/Judgement: Decreased awareness of safety;Decreased awareness of deficits Awareness: Intellectual Problem Solving: Requires verbal cues General Comments: Pt with increased disorientation and confusion this session; very poor awareness of NWB precautions and importance of this. Unsure of location, at first thinking we were at her hotel, November 2021, "I got drunk and fell down the steps"      Exercises  General Comments General comments (skin integrity, edema, etc.): Walked into room with pt attempting to remove KI - educ on need to wear at all times, pt stating "It's too hot I just need to cool off" and refusing  to allow it to be donned again      Pertinent Vitals/Pain Pain Assessment: Faces Faces Pain Scale: Hurts a little bit Pain Location: LLE Pain Descriptors / Indicators: Discomfort;Grimacing;Guarding Pain Intervention(s): Monitored during session    Home Living                      Prior Function            PT Goals (current goals can now be found in the care plan section) Progress towards PT goals: Progressing toward goals    Frequency    Min 3X/week      PT Plan Current plan remains appropriate    Co-evaluation              AM-PAC PT "6 Clicks" Mobility   Outcome Measure  Help needed turning from your back to your side while in a flat bed without using bedrails?: None Help needed moving from lying on your back to sitting on the side of a flat bed without using bedrails?: A Little Help needed moving to and from a bed to a chair (including a wheelchair)?: A Little Help needed standing up from a chair using your arms (e.g., wheelchair or bedside chair)?: A Little Help needed to walk in hospital room?: A Lot Help needed climbing 3-5 steps with a railing? : Total 6 Click Score: 16    End of Session Equipment Utilized During Treatment: Gait belt Activity Tolerance:  (Patient limited secondary to cognitive impairment) Patient left: in chair;with call bell/phone within reach;with chair alarm set Nurse Communication: Mobility status PT Visit Diagnosis: Other abnormalities of gait and mobility (R26.89);Pain;Difficulty in walking, not elsewhere classified (R26.2) Pain - Right/Left: Left Pain - part of body: Leg     Time: 9150-5697 PT Time Calculation (min) (ACUTE ONLY): 15 min  Charges:  $Therapeutic Activity: 8-22 mins                    Mabeline Caras, PT, DPT Acute Rehabilitation Services  Pager [phone] Office [place] 04/20/2020, 1:27 PM

## 2020-04-20 NOTE — Progress Notes (Signed)
Critical hemoglobin 6.3 on-call notified, 2 units of blood ordered.  Awaiting blood to be ordered from blood bank

## 2020-04-20 NOTE — Op Note (Signed)
NAME: DAYRIN, STALLONE MEDICAL RECORD [id number] ACCOUNT [id number] DATE OF BIRTH:08/18/1956 FACILITY: [facility] LOCATION: [facility] PHYSICIAN:Heith Haigler Randel Pigg, MD  OPERATIVE REPORT  DATE OF PROCEDURE:  04/17/2020  PREOPERATIVE DIAGNOSIS:  Left periprosthetic distal femur fracture.  POSTOPERATIVE DIAGNOSIS:  Left periprosthetic distal femur fracture.  PROCEDURE:  Retrograde intramedullary nail Biomet 13.5 cm x 38 cm with 4 locked distal screws and 1 proximal interlocking screw in the proximal aspect of the nail.  SURGEON:  Meredith Pel, MD  ASSISTANT:  Annie Main, PA  INDICATIONS:  Angel Hebert is a 64 year old patient who is 3 years out left total knee replacement and presents after falling and sustaining a periprosthetic fracture.  Presents now for operative management after explanation of risks and benefits.  PROCEDURE IN DETAIL:  The patient was brought to the operating room where general anesthetic was induced.  Preoperative IV antibiotics were administered.  Timeout was called.  Left leg prescrubbed with alcohol and Betadine, allowed to air dry.  Prepped  with DuraPrep solution and draped in sterile manner.  Ioban used to cover the operative field.  Incision was made just below the inferior pole of the patella.  Skin and subcutaneous tissue were sharply divided.  Patellar tendon was divided.  Blood was in  the joint.  Fracture was reduced.  Guide pin placed through the box of the cruciate retaining Triathlon total knee.  With reduction confirmed in the AP and lateral planes pin taken up to the lesser trochanter region.  Proximal reaming was then performed  up to 15 mm.  Nail was then placed and 4 interlocking screws were placed distally.  Bone quality was somewhat poor, but we did get reasonable fixation in the screws.  One interlocking screw placed proximally.  All confirmed in the AP and lateral planes  under fluoroscopy.  Construct was stable.  The thorough irrigation was then  performed of all incisions.  A solution of Marcaine, morphine, clonidine injected into the knee for postop pain relief.  The patellar tendon incision was closed using #1 Vicryl  suture followed by interrupted inverted 2-0 Vicryl suture and 3-0 nylon.  The remaining incisions were irrigated and closed using 2-0 Vicryl and 3-0 nylon.  Impervious dressings placed.  Bulky dressing placed with a knee immobilizer.  She will be kept  straight with the left leg and nonweightbearing for at least 4 weeks.  The patient tolerated the procedure well without immediate complication.  Luke's assistance was required for opening and closing, tissue mobilization.  His assistance was a medical  necessity.  CN/NUANCE  D:04/17/2020 T:04/18/2020 JOB:[id number]

## 2020-04-20 NOTE — Progress Notes (Signed)
Released blood and went to patient's room to get sticker and patient's bracelet was not on patient nor any where to be found.  Called blood bank and she needed orders to be T&C again.  Orders placed.

## 2020-04-20 NOTE — Plan of Care (Signed)

## 2020-04-21 LAB — CBC
HCT: 31.4 % — ABNORMAL LOW (ref 36.0–46.0)
Hemoglobin: 10.5 g/dL — ABNORMAL LOW (ref 12.0–15.0)
MCH: 31.3 pg (ref 26.0–34.0)
MCHC: 33.4 g/dL (ref 30.0–36.0)
MCV: 93.5 fL (ref 80.0–100.0)
Platelets: 224 10*3/uL (ref 150–400)
RBC: 3.36 MIL/uL — ABNORMAL LOW (ref 3.87–5.11)
RDW: 18.7 % — ABNORMAL HIGH (ref 11.5–15.5)
WBC: 5 10*3/uL (ref 4.0–10.5)
nRBC: 0.6 % — ABNORMAL HIGH (ref 0.0–0.2)

## 2020-04-21 MED ORDER — OXYCODONE HCL 5 MG PO TABS
5.0000 mg | ORAL_TABLET | Freq: Four times a day (QID) | ORAL | 0 refills | Status: DC | PRN
Start: 2020-04-21 — End: 2020-05-11

## 2020-04-21 MED ORDER — ENOXAPARIN SODIUM 30 MG/0.3ML ~~LOC~~ SOLN: 30.0000 mg | SUBCUTANEOUS | 0 refills | Status: DC

## 2020-04-21 MED ORDER — ENOXAPARIN (LOVENOX) PATIENT EDUCATION KIT: 1.0000 | PACK | Freq: Once | 0 refills | Status: AC

## 2020-04-21 NOTE — Progress Notes (Signed)
Patient stable Hemoglobin increased to over 10 today Okay to be discharged today.  Nonweightbearing in the knee immobilizer Follow-up in 1 week to see Lurena Joiner Lovenox for DVT prophylaxis

## 2020-04-21 NOTE — Progress Notes (Signed)
Pt's boyfriend will do Lovenox injection, instructions and demonstration on Lovenox injections done. Discharge instructions given to pt and boyfriend. Pt does not follow non-weight bearing restrictions. Left knee immobilizer on at all times. Discharged to home.

## 2020-04-22 LAB — BPAM RBC
Blood Product Expiration Date: 202109152359
Blood Product Expiration Date: 202109152359
ISSUE DATE / TIME: 202108192117
ISSUE DATE / TIME: 202108200052
Unit Type and Rh: 7300
Unit Type and Rh: 7300

## 2020-04-22 LAB — TYPE AND SCREEN
ABO/RH(D): B POS
Antibody Screen: NEGATIVE
Unit division: 0
Unit division: 0

## 2020-04-24 ENCOUNTER — Ambulatory Visit (INDEPENDENT_AMBULATORY_CARE_PROVIDER_SITE_OTHER): Payer: Medicaid Other

## 2020-04-24 ENCOUNTER — Telehealth: Payer: Self-pay

## 2020-04-24 ENCOUNTER — Ambulatory Visit (INDEPENDENT_AMBULATORY_CARE_PROVIDER_SITE_OTHER): Payer: Medicaid Other | Admitting: Surgical

## 2020-04-24 ENCOUNTER — Telehealth: Payer: Self-pay | Admitting: *Deleted

## 2020-04-24 ENCOUNTER — Encounter: Payer: Self-pay | Admitting: Surgical

## 2020-04-24 DIAGNOSIS — Z96659 Presence of unspecified artificial knee joint: Secondary | ICD-10-CM | POA: Diagnosis not present

## 2020-04-24 DIAGNOSIS — M978XXA Periprosthetic fracture around other internal prosthetic joint, initial encounter: Secondary | ICD-10-CM | POA: Diagnosis not present

## 2020-04-24 NOTE — Progress Notes (Signed)
Post-Op Visit Note   Patient: Angel Hebert           Date of Birth: Dec 24, 1955           MRN: [id number] Visit Date: 04/24/2020 PCP: Charlott Rakes, MD   Assessment & Plan:  Chief Complaint:  Chief Complaint  Patient presents with  . Left Leg - Routine Post Op   Visit Diagnoses:  1. Periprosthetic supracondylar fracture of femur, initial encounter     Plan: Patient is a 64 year old female who presents s/p left femoral IM nail for left femur periprosthetic fracture distally on 04/17/2020.  Patient is ambulating nonweightbearing on the left lower extremity with a walker and in a knee immobilizer.  She states that she is trying to keep her nonweightbearing status.  She is currently on Lovenox for DVT prophylaxis.  She was observed ambulating today in clinic and seems to have difficulty maintaining her nonweightbearing status on her left leg.  Recommended she switch to using a wheelchair over the next several weeks rather than the walker.  Patient and her boyfriend have a wheelchair and they agreed that this is a good idea.  She will remain in the knee immobilizer at all times when ambulating to avoid disrupting the fracture site with knee flexion.  Incisions are healing well.  Radiographs show no significant shifting of the fracture site compared with postop radiograph aside from slight migration of the small anterior fracture fragment but this should not be a major disrupter of the fixation.    With patient's potentially decreased nutritional status, will give her incisions more time to heal prior to removing sutures.  Follow-up on Friday this week for suture removal and Steri-Strip placement.  At that time, she will likely have ultrasound of the left lower extremity to rule out DVT.  If negative, transition from Lovenox aspirin.  I think this is important given her potential for a fall hazard which would be more dangerous on Lovenox than aspirin.  No calf tenderness and a negative  Bevelyn Buckles' sign today.  Plan to follow-up on Friday.  Patient agreed with plan.  Follow-Up Instructions: No follow-ups on file.   Orders:  Orders Placed This Encounter  Procedures  . XR FEMUR MIN 2 VIEWS LEFT   No orders of the defined types were placed in this encounter.   Imaging: No results found.  PMFS History: Patient Active Problem List   Diagnosis Date Noted  . Periprosthetic fracture around internal prosthetic left knee joint 04/17/2020  . Closed fracture of left distal femur ([place]) 04/16/2020  . Symptomatic anemia 07/12/2019  . Lobar pneumonia ([place]) 07/12/2019  . Insomnia 03/03/2017  . 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 Arthritis of knee 10/01/2016  . Physical deconditioning   . Anemia, iron deficiency   . Pressure ulcer 10/14/2015  . CAP (community acquired pneumonia)   . Sepsis due to Streptococcus pneumoniae ([place])   . Hyperlipidemia 03/02/2007  . Essential hypertension 03/02/2007  . Allergic rhinitis 03/02/2007  . Asthma 03/02/2007  . GERD 03/02/2007   Past Medical History:  Diagnosis Date  . Arthritis   . Asthma    last year in December  . Dyspnea   . GERD (gastroesophageal reflux disease)   . High cholesterol   . Hypertension   . Pneumonia 01/2017    Family History  Problem Relation Age of Onset  . Diabetes Sister   . Healthy Mother   . Healthy Father   . Colon cancer Neg Hx   .  Breast cancer Neg Hx     Past Surgical History:  Procedure Laterality Date  . ABDOMINAL HYSTERECTOMY    . FEMUR IM NAIL Left 04/17/2020   Procedure: INTRAMEDULLARY (IM) RETROGRADE FEMORAL NAILING;  Surgeon: Meredith Pel, MD;  Location: [place];  Service: Orthopedics;  Laterality: Left;  . nasal polyps     removed just this past tuesday at [facility] facility over by [facility]  . TOTAL KNEE ARTHROPLASTY Left 10/01/2016   Procedure: LEFT TOTAL KNEE ARTHROPLASTY;  Surgeon: Meredith Pel, MD;  Location: [place];  Service: Orthopedics;  Laterality:  Left;   Social History   Occupational History  . Not on file  Tobacco Use  . Smoking status: Never Smoker  . Smokeless tobacco: Never Used  Vaping Use  . Vaping Use: Never used  Substance and Sexual Activity  . Alcohol use: Yes    Alcohol/week: 1.0 standard drink    Types: 1 Shots of liquor per week    Comment: socially  . Drug use: No  . Sexual activity: Not on file

## 2020-04-24 NOTE — Telephone Encounter (Signed)
Attempted to contact patient to complete transition of care assessment. Line busy. Unable to contact patient.  Lenor Coffin, RN, BSN, [place] Patient [place] [phone]

## 2020-04-24 NOTE — Telephone Encounter (Signed)
Transition Care Management Follow-up Telephone Call Date of discharge and from where:04/21/2020, [facility]  Calls placed to patient x3 # [phone], the phone rang busy each attempt.   Patient needs to schedule follow up appointment with PCP

## 2020-04-25 ENCOUNTER — Inpatient Hospital Stay: Payer: Medicaid Other | Admitting: Family

## 2020-04-25 ENCOUNTER — Telehealth: Payer: Self-pay

## 2020-04-25 NOTE — Anesthesia Postprocedure Evaluation (Signed)
Anesthesia Post Note  Patient: Angel Hebert  Procedure(s) Performed: INTRAMEDULLARY (IM) RETROGRADE FEMORAL NAILING (Left Leg Upper)     Patient location during evaluation: PACU Anesthesia Type: General Level of consciousness: awake and alert Pain management: pain level controlled Vital Signs Assessment: post-procedure vital signs reviewed and stable Respiratory status: spontaneous breathing, nonlabored ventilation, respiratory function stable and patient connected to nasal cannula oxygen Cardiovascular status: blood pressure returned to baseline and stable Postop Assessment: no apparent nausea or vomiting Anesthetic complications: no   No complications documented.  Last Vitals:  Vitals:   04/21/20 0822 04/21/20 1302  BP: (!) 172/95 (!) 154/81  Pulse: 78 81  Resp: 16 17  Temp: 36.9 C 37.1 C  SpO2: 100% 98%    Last Pain:  Vitals:   04/21/20 1302  TempSrc: Oral  PainSc:                  Terianna Peggs COKER

## 2020-04-25 NOTE — Telephone Encounter (Signed)
Transition Care Management Follow-up Telephone Call Attempt # 2 Date of discharge and from where:04/21/2020, [facility]  Calls placed to patient x3 # [phone], the phone rang busy each attempt.   Patient needs to schedule follow up appointment with PCP. Letter sent to patient requesting she call the clinic to schedule the appointment

## 2020-04-27 ENCOUNTER — Telehealth: Payer: Self-pay

## 2020-04-27 NOTE — Telephone Encounter (Signed)
IC LMVM for patient advising forms for [facility] completed and put at front desk for her to pick up.

## 2020-04-27 NOTE — Discharge Summary (Signed)
Physician Discharge Summary      Patient ID: Angel Hebert MRN: [id number] DOB/AGE: [birth year] 63 y.o.  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 female who was admitted 04/16/2020 with a chief complaint of left knee pain, and found to have a diagnosis of left distal femur fracture above knee prosthesis.  They were brought to the operating room on 04/17/2020 and underwent the above named procedures.  Pt awoke from anesthesia without complication and was transferred to the floor. On POD1, patient's pain was controlled and she maintained her nonweightbearing status.  Highly recommended skilled nursing throughout her stay in addition to physical therapy recommending skilled nursing.  Patient was adamant that she wanted to return home where she would be taken care by her boyfriend.  She does have a history of alcohol abuse so there was a lot of concern with that history.  Despite multiple days spent trying to convince her, she adamantly refused and was discharged on 04/21/2020 to her home [place].  She did have to undergo transfusion of RBC with restoration and improvement in her hemoglobin while she was staying in the hospital.  She will maintain her nonweightbearing status and knee immobilizer for several weeks following her discharge.  Pt will f/u with Dr. Marlou Sa in clinic in ~1 weeks.   Antibiotics given:  Anti-infectives (From admission, onward)   Start     Dose/Rate Route Frequency Ordered Stop   04/17/20 2130  ceFAZolin (ANCEF) IVPB 2g/100 mL premix        2 g 200 mL/hr over 30 Minutes Intravenous  Every 6 hours 04/17/20 2120 04/18/20 0444   04/17/20 0915  ceFAZolin (ANCEF) IVPB 2g/100 mL premix        2 g 200 mL/hr over 30 Minutes Intravenous To [facility]Stay [facility] 04/17/20 0903 04/17/20 1825    .  Recent vital signs:  Vitals:   04/21/20 0822 04/21/20 1302  BP: (!) 172/95 (!) 154/81  Pulse: 78 81  Resp: 16 17  Temp: 98.5 F (36.9 C) 98.7 F (37.1 C)  SpO2: 100% 98%    Recent laboratory studies:  Results for orders placed or performed during the hospital encounter of 04/16/20  SARS Coronavirus 2 by RT PCR (hospital order, performed in [facility] hospital lab) Nasopharyngeal Nasopharyngeal Swab   Specimen: Nasopharyngeal Swab  Result Value Ref Range   SARS Coronavirus 2 NEGATIVE NEGATIVE  Surgical pcr screen   Specimen: Nasal Mucosa; Nasal Swab  Result Value Ref Range   MRSA, PCR NEGATIVE NEGATIVE   Staphylococcus aureus NEGATIVE NEGATIVE  Basic metabolic panel  Result Value Ref Range   Sodium 137 135 - 145 mmol/L   Potassium 3.7 3.5 - 5.1 mmol/L   Chloride 102 98 - 111 mmol/L   CO2 23 22 - 32 mmol/L   Glucose, Bld 114 (H) 70 - 99 mg/dL   BUN 6 (L) 8 - 23 mg/dL   Creatinine, Ser 0.56 0.44 - 1.00 mg/dL   Calcium 8.5 (L) 8.9 - 10.3 mg/dL   GFR calc non Af Amer >60 >60 mL/min   GFR calc Af Amer >60 >60 mL/min   Anion gap 12 5 - 15  CBC  with Differential  Result Value Ref Range   WBC 5.4 4.0 - 10.5 K/uL   RBC 3.10 (L) 3.87 - 5.11 MIL/uL   Hemoglobin 9.6 (L) 12.0 - 15.0 g/dL   HCT 29.0 (L) 36 - 46 %   MCV 93.5 80.0 - 100.0 fL   MCH 31.0 26.0 - 34.0 pg   MCHC 33.1 30.0 - 36.0 g/dL   RDW 19.2 (H) 11.5 - 15.5 %   Platelets 223 150 - 400 K/uL   nRBC 0.0 0.0 - 0.2 %   Neutrophils Relative % 74 %   Neutro Abs 4.0 1.7 - 7.7 K/uL   Lymphocytes Relative 16 %   Lymphs Abs 0.8 0.7 - 4.0 K/uL   Monocytes Relative 9 %   Monocytes Absolute 0.5 0 - 1 K/uL   Eosinophils Relative 0 %   Eosinophils Absolute 0.0 0 - 0 K/uL   Basophils Relative 1 %   Basophils Absolute 0.0  0 - 0 K/uL   Immature Granulocytes 0 %   Abs Immature Granulocytes 0.01 0.00 - 0.07 K/uL  Hepatic function panel  Result Value Ref Range   Total Protein 6.8 6.5 - 8.1 g/dL   Albumin 2.8 (L) 3.5 - 5.0 g/dL   AST 73 (H) 15 - 41 U/L   ALT 33 0 - 44 U/L   Alkaline Phosphatase 111 38 - 126 U/L   Total Bilirubin 1.7 (H) 0.3 - 1.2 mg/dL   Bilirubin, Direct 0.4 (H) 0.0 - 0.2 mg/dL   Indirect Bilirubin 1.3 (H) 0.3 - 0.9 mg/dL  Ethanol  Result Value Ref Range   Alcohol, Ethyl (B) <10 <10 mg/dL  Basic metabolic panel  Result Value Ref Range   Sodium 137 135 - 145 mmol/L   Potassium 3.5 3.5 - 5.1 mmol/L   Chloride 104 98 - 111 mmol/L   CO2 25 22 - 32 mmol/L   Glucose, Bld 105 (H) 70 - 99 mg/dL   BUN 7 (L) 8 - 23 mg/dL   Creatinine, Ser 0.60 0.44 - 1.00 mg/dL   Calcium 8.5 (L) 8.9 - 10.3 mg/dL   GFR calc non Af Amer >60 >60 mL/min   GFR calc Af Amer >60 >60 mL/min   Anion gap 8 5 - 15  CBC  Result Value Ref Range   WBC 3.9 (L) 4.0 - 10.5 K/uL   RBC 2.65 (L) 3.87 - 5.11 MIL/uL   Hemoglobin 8.3 (L) 12.0 - 15.0 g/dL   HCT 24.8 (L) 36 - 46 %   MCV 93.6 80.0 - 100.0 fL   MCH 31.3 26.0 - 34.0 pg   MCHC 33.5 30.0 - 36.0 g/dL   RDW 18.8 (H) 11.5 - 15.5 %   Platelets 191 150 - 400 K/uL   nRBC 0.5 (H) 0.0 - 0.2 %  Basic metabolic panel  Result Value Ref Range   Sodium 135 135 - 145 mmol/L   Potassium 3.4 (L) 3.5 - 5.1 mmol/L   Chloride 102 98 - 111 mmol/L   CO2 20 (L) 22 - 32 mmol/L   Glucose, Bld 183 (H) 70 - 99 mg/dL   BUN 9 8 - 23 mg/dL   Creatinine, Ser 0.79 0.44 - 1.00 mg/dL   Calcium 8.7 (L) 8.9 - 10.3 mg/dL   GFR calc non Af Amer >60 >60 mL/min   GFR calc Af Amer >60 >60 mL/min   Anion gap 13 5 - 15  CBC  Result Value Ref Range   WBC 7.8 4.0 - 10.5  K/uL   RBC 2.63 (L) 3.87 - 5.11 MIL/uL   Hemoglobin 8.2 (L) 12.0 - 15.0 g/dL   HCT 25.9 (L) 36 - 46 %   MCV 98.5 80.0 - 100.0 fL   MCH 31.2 26.0 - 34.0 pg   MCHC 31.7 30.0 - 36.0 g/dL   RDW 18.8 (H) 11.5 - 15.5 %   Platelets  195 150 - 400 K/uL   nRBC 0.6 (H) 0.0 - 0.2 %  CBC  Result Value Ref Range   WBC 6.9 4.0 - 10.5 K/uL   RBC 2.73 (L) 3.87 - 5.11 MIL/uL   Hemoglobin 8.4 (L) 12.0 - 15.0 g/dL   HCT 26.3 (L) 36 - 46 %   MCV 96.3 80.0 - 100.0 fL   MCH 30.8 26.0 - 34.0 pg   MCHC 31.9 30.0 - 36.0 g/dL   RDW 18.8 (H) 11.5 - 15.5 %   Platelets 173 150 - 400 K/uL   nRBC 0.4 (H) 0.0 - 0.2 %  Creatinine, serum  Result Value Ref Range   Creatinine, Ser 0.75 0.44 - 1.00 mg/dL   GFR calc non Af Amer >60 >60 mL/min   GFR calc Af Amer >60 >60 mL/min  Basic metabolic panel  Result Value Ref Range   Sodium 134 (L) 135 - 145 mmol/L   Potassium 2.9 (L) 3.5 - 5.1 mmol/L   Chloride 99 98 - 111 mmol/L   CO2 26 22 - 32 mmol/L   Glucose, Bld 88 70 - 99 mg/dL   BUN 6 (L) 8 - 23 mg/dL   Creatinine, Ser 0.50 0.44 - 1.00 mg/dL   Calcium 8.5 (L) 8.9 - 10.3 mg/dL   GFR calc non Af Amer >60 >60 mL/min   GFR calc Af Amer >60 >60 mL/min   Anion gap 9 5 - 15  CBC  Result Value Ref Range   WBC 6.4 4.0 - 10.5 K/uL   RBC 2.24 (L) 3.87 - 5.11 MIL/uL   Hemoglobin 7.2 (L) 12.0 - 15.0 g/dL   HCT 21.8 (L) 36 - 46 %   MCV 97.3 80.0 - 100.0 fL   MCH 32.1 26.0 - 34.0 pg   MCHC 33.0 30.0 - 36.0 g/dL   RDW 18.6 (H) 11.5 - 15.5 %   Platelets 205 150 - 400 K/uL   nRBC 0.0 0.0 - 0.2 %  CBC  Result Value Ref Range   WBC 5.1 4.0 - 10.5 K/uL   RBC 2.04 (L) 3.87 - 5.11 MIL/uL   Hemoglobin 6.3 (LL) 12.0 - 15.0 g/dL   HCT 19.3 (L) 36 - 46 %   MCV 94.6 80.0 - 100.0 fL   MCH 30.9 26.0 - 34.0 pg   MCHC 32.6 30.0 - 36.0 g/dL   RDW 18.6 (H) 11.5 - 15.5 %   Platelets 183 150 - 400 K/uL   nRBC 0.6 (H) 0.0 - 0.2 %  CBC  Result Value Ref Range   WBC 5.0 4.0 - 10.5 K/uL   RBC 3.36 (L) 3.87 - 5.11 MIL/uL   Hemoglobin 10.5 (L) 12.0 - 15.0 g/dL   HCT 31.4 (L) 36 - 46 %   MCV 93.5 80.0 - 100.0 fL   MCH 31.3 26.0 - 34.0 pg   MCHC 33.4 30.0 - 36.0 g/dL   RDW 18.7 (H) 11.5 - 15.5 %   Platelets 224 150 - 400 K/uL   nRBC 0.6 (H) 0.0 - 0.2 %   Type and screen [place]  Result Value  Ref Range   ABO/RH(D) B POS    Antibody Screen NEG    Sample Expiration      04/20/2020,2359 Performed at [facility], [place] [address], [place], [place] [address]    Unit Number [id number]    Blood Component Type RED CELLS,LR    Unit division 00    Status of Unit REL FROM [facility]    Transfusion Status OK TO TRANSFUSE    Crossmatch Result Compatible    Unit Number [id number]    Blood Component Type RED CELLS,LR    Unit division 00    Status of Unit REL FROM [facility]    Transfusion Status OK TO TRANSFUSE    Crossmatch Result Compatible   Prepare RBC (crossmatch)  Result Value Ref Range   Order Confirmation      ORDER PROCESSED BY BLOOD BANK Performed at [facility], [address], [place], [place] [address]   Type and screen [place]  Result Value Ref Range   ABO/RH(D) B POS    Antibody Screen NEG    Sample Expiration 04/23/2020,2359    Unit Number [id number]    Blood Component Type RED CELLS,LR    Unit division 00    Status of Unit ISSUED,FINAL    Transfusion Status OK TO TRANSFUSE    Crossmatch Result Compatible    Unit Number [id number]    Blood Component Type RED CELLS,LR    Unit division 00    Status of Unit ISSUED,FINAL    Transfusion Status OK TO TRANSFUSE    Crossmatch Result      Compatible Performed at [facility], [place] [address], [place], [place] [address]   BPAM [facility]  Result Value Ref Range   Blood Product Unit Number [id number]    PRODUCT CODE [id number]    Unit Type and Rh 7300    Blood Product Expiration Date 812751700174    Blood Product Unit Number [id number]    PRODUCT CODE [id number]    Unit Type and Rh 7300    Blood Product Expiration Date 017793903009   BPAM RBC  Result Value Ref Range   ISSUE DATE / TIME 233007622633    Blood Product Unit Number [id number]    PRODUCT CODE [id number]    Unit Type and Rh 7300    Blood Product  Expiration Date 157262035597    ISSUE DATE / TIME 416384536468    Blood Product Unit Number [id number]    PRODUCT CODE [id number]    Unit Type and Rh 7300    Blood Product Expiration Date 694503888280     Discharge Medications:   Allergies as of 04/21/2020   No Known Allergies     Medication List    STOP taking these medications   All Day Allergy 10 MG tablet Generic drug: cetirizine   HYDROcodone-acetaminophen 5-325 MG tablet Commonly known as: NORCO/VICODIN   naproxen 500 MG tablet Commonly known as: NAPROSYN   ondansetron 4 MG tablet Commonly known as: ZOFRAN     TAKE these medications   albuterol (2.5 MG/3ML) 0.083% nebulizer solution Commonly known as: PROVENTIL Take 3 mLs (2.5 mg total) by nebulization every 6 (six) hours as needed for wheezing or shortness of breath.   ProAir HFA 108 (90 Base) MCG/ACT inhaler Generic drug: albuterol Inhale 2 puffs into the lungs every 6 (six) hours as needed for wheezing or shortness of breath.   atorvastatin 10 MG tablet Commonly known as: LIPITOR Take 1 tablet (10 mg total)  by mouth daily at 6 PM.   enoxaparin 30 MG/0.3ML injection Commonly known as: LOVENOX Inject 0.3 mLs (30 mg total) into the skin daily.   ferrous sulfate 325 (65 FE) MG EC tablet Take 1 tablet (325 mg total) by mouth 2 (two) times daily.   fluticasone 50 MCG/ACT nasal spray Commonly known as: FLONASE Place 2 sprays into both nostrils daily.   Fluticasone-Salmeterol 500-50 MCG/DOSE Aepb Commonly known as: Advair Diskus INHALE 1 PUFF INTO THE LUNGS EVERY 12 (TWELVE) HOURS.   furosemide 40 MG tablet Commonly known as: Lasix Take 1 tablet (40 mg total) by mouth daily.   losartan-hydrochlorothiazide 100-25 MG tablet Commonly known as: HYZAAR Take 1 tablet by mouth daily.   metoprolol succinate 25 MG 24 hr tablet Commonly known as: Toprol XL Take 1 tablet (25 mg total) by mouth daily.   montelukast 10 MG tablet Commonly known as:  SINGULAIR Take 1 tablet (10 mg total) by mouth at bedtime.   oxyCODONE 5 MG immediate release tablet Commonly known as: Oxy IR/ROXICODONE Take 1 tablet (5 mg total) by mouth every 6 (six) hours as needed for moderate pain (pain score 4-6).   pantoprazole 20 MG tablet Commonly known as: PROTONIX Take 1 tablet (20 mg total) by mouth daily.   potassium chloride SA 20 MEQ tablet Commonly known as: KLOR-CON Take 1 tablet (20 mEq total) by mouth daily.     ASK your doctor about these medications   enoxaparin Kit Commonly known as: LOVENOX 1 kit by Does not apply route once for 1 dose. Ask about: Should I take this medication?       Diagnostic Studies: DG Chest 1 View  Result Date: 04/16/2020 CLINICAL DATA:  Left knee pain and swelling. EXAM: CHEST  1 VIEW COMPARISON:  July 20, 2019 FINDINGS: There is no evidence of acute infiltrate, pleural effusion or pneumothorax. The heart size and mediastinal contours are within normal limits. The visualized skeletal structures are unremarkable. IMPRESSION: No active disease. Electronically Signed   By: Virgina Norfolk M.D.   On: 04/16/2020 17:14   CT Head Wo Contrast  Result Date: 04/16/2020 CLINICAL DATA:  Trauma, falls. EXAM: CT HEAD WITHOUT CONTRAST TECHNIQUE: Contiguous axial images were obtained from the base of the skull through the vertex without intravenous contrast. COMPARISON:  07/17/2019 head CT. FINDINGS: Brain: No acute infarct or intracranial hemorrhage. No mass lesion. No midline shift, ventriculomegaly or extra-axial fluid collection. Mild cerebral atrophy with ex vacuo dilatation. Chronic microvascular ischemic changes are similar to prior exam. Vascular: No hyperdense vessel. Bilateral skull base atherosclerotic calcifications. Skull: Negative for fracture or focal lesion. Sinuses/Orbits: Normal orbits. Pansinus disease. No mastoid effusion. Other: None. IMPRESSION: No acute intracranial process. Mild cerebral atrophy and chronic  microvascular ischemic changes. Electronically Signed   By: Primitivo Gauze M.D.   On: 04/16/2020 18:24   CT KNEE LEFT WO CONTRAST  Result Date: 04/16/2020 CLINICAL DATA:  Fall knee pain and swelling EXAM: CT OF THE left KNEE WITHOUT CONTRAST TECHNIQUE: Multidetector CT imaging of the left knee was performed according to the standard protocol. Multiplanar CT image reconstructions were also generated. COMPARISON:  None. FINDINGS: Bones/Joint/Cartilage The patient is status post left total knee arthroplasty. There is a comminuted slightly impacted fracture seen through the distal femur. There is slight anterior angulation of the femoral shaft on the distal femur. There is a small fracture fragment seen anteriorly. The tibial prosthesis is still appears to be intact. There is slight patellar [place] present. A small  lipohemarthrosis is present. Ligaments Suboptimally assessed by CT. Muscles and Tendons There is mild edema within the muscles surrounding the knee, however they appear to be grossly intact. The patellar and quadriceps tendon appear to be intact. Soft tissues Diffuse prepatellar subcutaneous edema is noted. IMPRESSION: Comminuted slightly impacted periprosthetic distal femoral fracture. Slight patellar [place] Small lipohemarthrosis Electronically Signed   By: Prudencio Pair M.D.   On: 04/16/2020 18:44   DG Knee Complete 4 Views Left  Result Date: 04/16/2020 CLINICAL DATA:  Left knee pain and swelling EXAM: LEFT KNEE - COMPLETE 4+ VIEW COMPARISON:  None. FINDINGS: The patient is status post left total knee arthroplasty. There is a comminuted periprosthetic distal femur fracture with slight anterior angulation of the femoral staffed. A moderate knee joint effusion and diffuse soft tissue swelling seen around the knee. IMPRESSION: Comminuted mildly angulated distal femoral periprosthetic fracture. Electronically Signed   By: Prudencio Pair M.D.   On: 04/16/2020 17:16   DG C-Arm 1-60 Min  Result Date:  04/17/2020 CLINICAL DATA:  Intramedullary retrograde femoral nail. EXAM: LEFT FEMUR 2 VIEWS; DG C-ARM 1-60 MIN COMPARISON:  Preoperative radiographs yesterday. FINDINGS: Eight fluoroscopic spot views of the left femur in frontal and lateral projections. Placement of intramedullary nail with distal and proximal locking screws traversing periprosthetic distal femur fracture. Knee arthroplasty visualized. Total fluoroscopy time 2 minutes 43 seconds. Total dose 6.28 mGy. IMPRESSION: Intraoperative fluoroscopy during ORIF of periprosthetic distal femur fracture. Electronically Signed   By: Keith Rake M.D.   On: 04/17/2020 19:55   DG FEMUR MIN 2 VIEWS LEFT  Result Date: 04/17/2020 CLINICAL DATA:  Intramedullary retrograde femoral nail. EXAM: LEFT FEMUR 2 VIEWS; DG C-ARM 1-60 MIN COMPARISON:  Preoperative radiographs yesterday. FINDINGS: Eight fluoroscopic spot views of the left femur in frontal and lateral projections. Placement of intramedullary nail with distal and proximal locking screws traversing periprosthetic distal femur fracture. Knee arthroplasty visualized. Total fluoroscopy time 2 minutes 43 seconds. Total dose 6.28 mGy. IMPRESSION: Intraoperative fluoroscopy during ORIF of periprosthetic distal femur fracture. Electronically Signed   By: Keith Rake M.D.   On: 04/17/2020 19:55   DG Femur Min 2 Views Left  Result Date: 04/16/2020 CLINICAL DATA:  Atraumatic left knee pain and swelling. EXAM: LEFT FEMUR 2 VIEWS COMPARISON:  None. FINDINGS: Acute fracture is seen involving the distal shaft of the left femur. Approximately 1/2 shaft width medial displacement of the distal fracture site is seen. A left knee replacement is noted without evidence of surrounding lucency to suggest the presence of hardware loosening or infection. A moderate-sized patellofemoral joint effusion is noted. IMPRESSION: 1. Acute fracture of the distal left femur. 2. Left knee replacement without evidence of hardware  loosening or infection. Electronically Signed   By: Virgina Norfolk M.D.   On: 04/16/2020 17:15   DG FEMUR PORT MIN 2 VIEWS LEFT  Result Date: 04/17/2020 CLINICAL DATA:  64 year old female with left femoral intramedullary nail. EXAM: LEFT FEMUR PORTABLE 2 VIEWS COMPARISON:  Intraoperative fluoroscopic study dated 04/17/2020. FINDINGS: There is a displaced and comminuted periprostatic fracture of the distal femur. Left femoral diaphysis intramedullary nail noted. The nail is intact. Interval decrease in the posterior angulation compared to the prior radiograph. There is no dislocation. The bones are osteopenic. Degenerative changes of the left hip. There is diffuse subcutaneous edema. IMPRESSION: Displaced and comminuted periprostatic fracture of the distal femur status post internal fixation. Electronically Signed   By: Anner Crete M.D.   On: 04/17/2020 20:52   XR  FEMUR MIN 2 VIEWS LEFT  Result Date: 04/24/2020 AP, lateral views of left femur reviewed.  No significant shifting of fracture site compared with prior radiographs.  Hardware in good position.  There is a small amount of displacement of the very small anterior fracture fragment.  No significant lucency surrounding screws or backing out of the screws.  No change in position of the nail.  No callus formation noted yet.   Disposition: Discharge disposition: 01-Home or Self Care       Discharge Instructions    Ambulatory referral to Physical Therapy   Complete by: As directed    Call MD / Call 911   Complete by: As directed    If you experience chest pain or shortness of breath, CALL 911 and be transported to the hospital emergency room.  If you develope a fever above 101 F, pus (white drainage) or increased drainage or redness at the wound, or calf pain, call your surgeon's office.   Constipation Prevention   Complete by: As directed    Drink plenty of fluids.  Prune juice may be helpful.  You may use a stool softener, such as  Colace (over the counter) 100 mg twice a day.  Use MiraLax (over the counter) for constipation as needed.   Diet - low sodium heart healthy   Complete by: As directed    Discharge instructions   Complete by: As directed    Do not put any weight on the operative leg.  Keep leg straight in the knee immobilizer at all times.  Avoid knee bending past 90 degrees.  You may shower, dressing is waterproof.  Do not submerge under water as in a pool or tub.  Please call the office at [phone] to schedule a follow-up appointment with Dr. Marlou Sa in one week.  Do not take pain medication while drinking alcohol.   Increase activity slowly as tolerated   Complete by: As directed          Signed: Donella Stade 04/27/2020, 10:45 AM

## 2020-04-28 ENCOUNTER — Telehealth: Payer: Self-pay

## 2020-04-28 ENCOUNTER — Ambulatory Visit: Payer: Medicaid Other | Admitting: Surgical

## 2020-04-28 NOTE — Telephone Encounter (Signed)
Tried calling patient to see reason for rescheduling appt. No answer. LMVM for her stating she had to come back because she still has her sutures in.

## 2020-05-03 DIAGNOSIS — R531 Weakness: Secondary | ICD-10-CM | POA: Diagnosis not present

## 2020-05-03 DIAGNOSIS — M25562 Pain in left knee: Secondary | ICD-10-CM | POA: Diagnosis not present

## 2020-05-11 ENCOUNTER — Ambulatory Visit (INDEPENDENT_AMBULATORY_CARE_PROVIDER_SITE_OTHER): Payer: Medicaid Other | Admitting: Surgical

## 2020-05-11 ENCOUNTER — Ambulatory Visit: Payer: Self-pay

## 2020-05-11 DIAGNOSIS — M978XXA Periprosthetic fracture around other internal prosthetic joint, initial encounter: Secondary | ICD-10-CM

## 2020-05-11 DIAGNOSIS — M25552 Pain in left hip: Secondary | ICD-10-CM | POA: Diagnosis not present

## 2020-05-11 DIAGNOSIS — Z96659 Presence of unspecified artificial knee joint: Secondary | ICD-10-CM

## 2020-05-11 MED ORDER — OXYCODONE HCL 5 MG PO TABS: 5.0000 mg | ORAL_TABLET | Freq: Every day | ORAL | 0 refills | Status: DC | PRN

## 2020-05-12 ENCOUNTER — Other Ambulatory Visit: Payer: Self-pay

## 2020-05-12 ENCOUNTER — Ambulatory Visit (HOSPITAL_COMMUNITY)
Admission: RE | Admit: 2020-05-12 | Discharge: 2020-05-12 | Disposition: A | Discharge status: Home / Self Care | Payer: Medicaid Other | Source: Ambulatory Visit | Attending: Surgical | Admitting: Surgical

## 2020-05-12 ENCOUNTER — Ambulatory Visit: Payer: Medicaid Other | Admitting: Surgical

## 2020-05-12 DIAGNOSIS — Z96659 Presence of unspecified artificial knee joint: Secondary | ICD-10-CM

## 2020-05-12 DIAGNOSIS — M978XXA Periprosthetic fracture around other internal prosthetic joint, initial encounter: Secondary | ICD-10-CM

## 2020-05-15 NOTE — Progress Notes (Signed)
Post-Op Visit Note   Patient: Angel Hebert           Date of Birth: 01-Sep-1956           MRN: [id number] Visit Date: 05/11/2020 PCP: Charlott Rakes, MD   Assessment & Plan:  Chief Complaint:  Chief Complaint  Patient presents with  . Post-op Follow-up   Visit Diagnoses:  1. Periprosthetic supracondylar fracture of femur, initial encounter     Plan: Patient is a 64 year old female presents s/p retrograde femoral IM nail for periprosthetic femur fracture of the left lower extremity.  She is about 3 and half weeks out.  She is ambulating toe-touch weightbearing with a walker.  She takes pain medication about once per day.  She is sleeping well.  She has run out of Lovenox and has not pursued a Lovenox refill or transition to aspirin.  On exam she is able to perform a straight leg raise and has 0 degrees of extension to 90 degrees of flexion.  She still has continued tenderness to palpation over the fracture site.  She does have some mild calf tenderness on exam and a negative Homans' sign.  Plan to refill pain medication.  Continue with toe-touch weightbearing.  Ordered ultrasound of the left lower extremity to evaluate for DVT.  This was found to be negative for DVT.  Plan to transition from Lovenox to aspirin with negative ultrasound.  Radiographs of the left femur taken today reveal intramedullary nail in good position with no lucency surrounding the screws.  There is early callus formation surrounding the fracture site.  Slightly increased posterior tilt of the distal femur compared with prior radiographs.  Plan for patient to follow-up on 06/01/2020.  Follow-Up Instructions: No follow-ups on file.   Orders:  Orders Placed This Encounter  Procedures  . XR FEMUR MIN 2 VIEWS LEFT  . VAS [place] LOWER EXTREMITY VENOUS (DVT)   Meds ordered this encounter  Medications  . oxyCODONE (OXY IR/ROXICODONE) 5 MG immediate release tablet    Sig: Take 1 tablet (5 mg total) by mouth daily as  needed for moderate pain (pain score 4-6).    Dispense:  30 tablet    Refill:  0    Imaging: No results found.  PMFS History: Patient Active Problem List   Diagnosis Date Noted  . Periprosthetic fracture around internal prosthetic left knee joint 04/17/2020  . Closed fracture of left distal femur ([place]) 04/16/2020  . Symptomatic anemia 07/12/2019  . Lobar pneumonia ([place]) 07/12/2019  . Insomnia 03/03/2017  . 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 Arthritis of knee 10/01/2016  . Physical deconditioning   . Anemia, iron deficiency   . Pressure ulcer 10/14/2015  . CAP (community acquired pneumonia)   . Sepsis due to Streptococcus pneumoniae ([place])   . Hyperlipidemia 03/02/2007  . Essential hypertension 03/02/2007  . Allergic rhinitis 03/02/2007  . Asthma 03/02/2007  . GERD 03/02/2007   Past Medical History:  Diagnosis Date  . Arthritis   . Asthma    last year in December  . Dyspnea   . GERD (gastroesophageal reflux disease)   . High cholesterol   . Hypertension   . Pneumonia 01/2017    Family History  Problem Relation Age of Onset  . Diabetes Sister   . Healthy Mother   . Healthy Father   . Colon cancer Neg Hx   . Breast cancer Neg Hx     Past Surgical History:  Procedure Laterality Date  .  ABDOMINAL HYSTERECTOMY    . FEMUR IM NAIL Left 04/17/2020   Procedure: INTRAMEDULLARY (IM) RETROGRADE FEMORAL NAILING;  Surgeon: Meredith Pel, MD;  Location: [facility];  Service: Orthopedics;  Laterality: Left;  . nasal polyps     removed just this past tuesday at [facility] facility over by [facility]  . TOTAL KNEE ARTHROPLASTY Left 10/01/2016   Procedure: LEFT TOTAL KNEE ARTHROPLASTY;  Surgeon: Meredith Pel, MD;  Location: [facility];  Service: Orthopedics;  Laterality: Left;   Social History   Occupational History  . Not on file  Tobacco Use  . Smoking status: Never Smoker  . Smokeless tobacco: Never Used  Vaping Use  . Vaping Use: Never used    Substance and Sexual Activity  . Alcohol use: Yes    Alcohol/week: 1.0 standard drink    Types: 1 Shots of liquor per week    Comment: socially  . Drug use: No  . Sexual activity: Not on file

## 2020-05-24 ENCOUNTER — Telehealth: Payer: Self-pay | Admitting: Orthopedic Surgery

## 2020-05-24 NOTE — Telephone Encounter (Signed)
Patient called asked if I could call social security office to inquire what exactly they need from [place]. She states she did not need records and she hasn't filed for SSD. I called a Ms. Hassell Done at [employer] ([place] office), lmvm asking for a return call as I am trying to assist a patient with what they are needing from her. Ms Hassell Done [phone] ext [phone].

## 2020-05-31 ENCOUNTER — Ambulatory Visit: Payer: Medicaid Other | Admitting: Orthopedic Surgery

## 2020-06-02 DIAGNOSIS — R531 Weakness: Secondary | ICD-10-CM | POA: Diagnosis not present

## 2020-06-02 DIAGNOSIS — M25562 Pain in left knee: Secondary | ICD-10-CM | POA: Diagnosis not present

## 2020-07-03 DIAGNOSIS — M25562 Pain in left knee: Secondary | ICD-10-CM | POA: Diagnosis not present

## 2020-07-03 DIAGNOSIS — R531 Weakness: Secondary | ICD-10-CM | POA: Diagnosis not present

## 2020-07-21 ENCOUNTER — Other Ambulatory Visit: Payer: Self-pay | Admitting: Family Medicine

## 2020-07-21 DIAGNOSIS — G4709 Other insomnia: Secondary | ICD-10-CM

## 2020-08-02 DIAGNOSIS — M25562 Pain in left knee: Secondary | ICD-10-CM | POA: Diagnosis not present

## 2020-08-02 DIAGNOSIS — R531 Weakness: Secondary | ICD-10-CM | POA: Diagnosis not present

## 2020-08-16 ENCOUNTER — Other Ambulatory Visit: Payer: Self-pay | Admitting: Family Medicine

## 2020-08-16 DIAGNOSIS — J4520 Mild intermittent asthma, uncomplicated: Secondary | ICD-10-CM

## 2020-08-16 MED ORDER — ALBUTEROL SULFATE (2.5 MG/3ML) 0.083% IN NEBU: 2.5000 mg | INHALATION_SOLUTION | Freq: Four times a day (QID) | RESPIRATORY_TRACT | 3 refills | Status: DC | PRN

## 2020-08-16 NOTE — Telephone Encounter (Signed)
albuterol (PROVENTIL) (2.5 MG/3ML) 0.083% nebulizer solution 75 mL 3 10/19/2018    Sig - Route: Take 3 mLs (2.5 mg total) by nebulization every 6 (six) hours as needed for wheezing or shortness of breath. - Nebulization   Patient not taking: Reported on 04/16/2020       Sent to pharmacy as: albuterol (PROVENTIL) (2.5 MG/3ML) 0.083% nebulizer solution   E-Prescribing Status: Receipt confirmed by pharmacy (10/19/2018 2:33 PM EST)    [id number] [place], [place] [address] Phone:  [phone]  Fax:  [phone]      Pt made nxt available appt 10/04/2020

## 2020-09-27 IMAGING — CR DG CHEST 2V
2 series · 2 of 2 positions shown · non-contrast
Comparison: 07/16/2019

CLINICAL DATA: Multifocal pneumonia

EXAM:
CHEST - 2 VIEW

[chest ap]
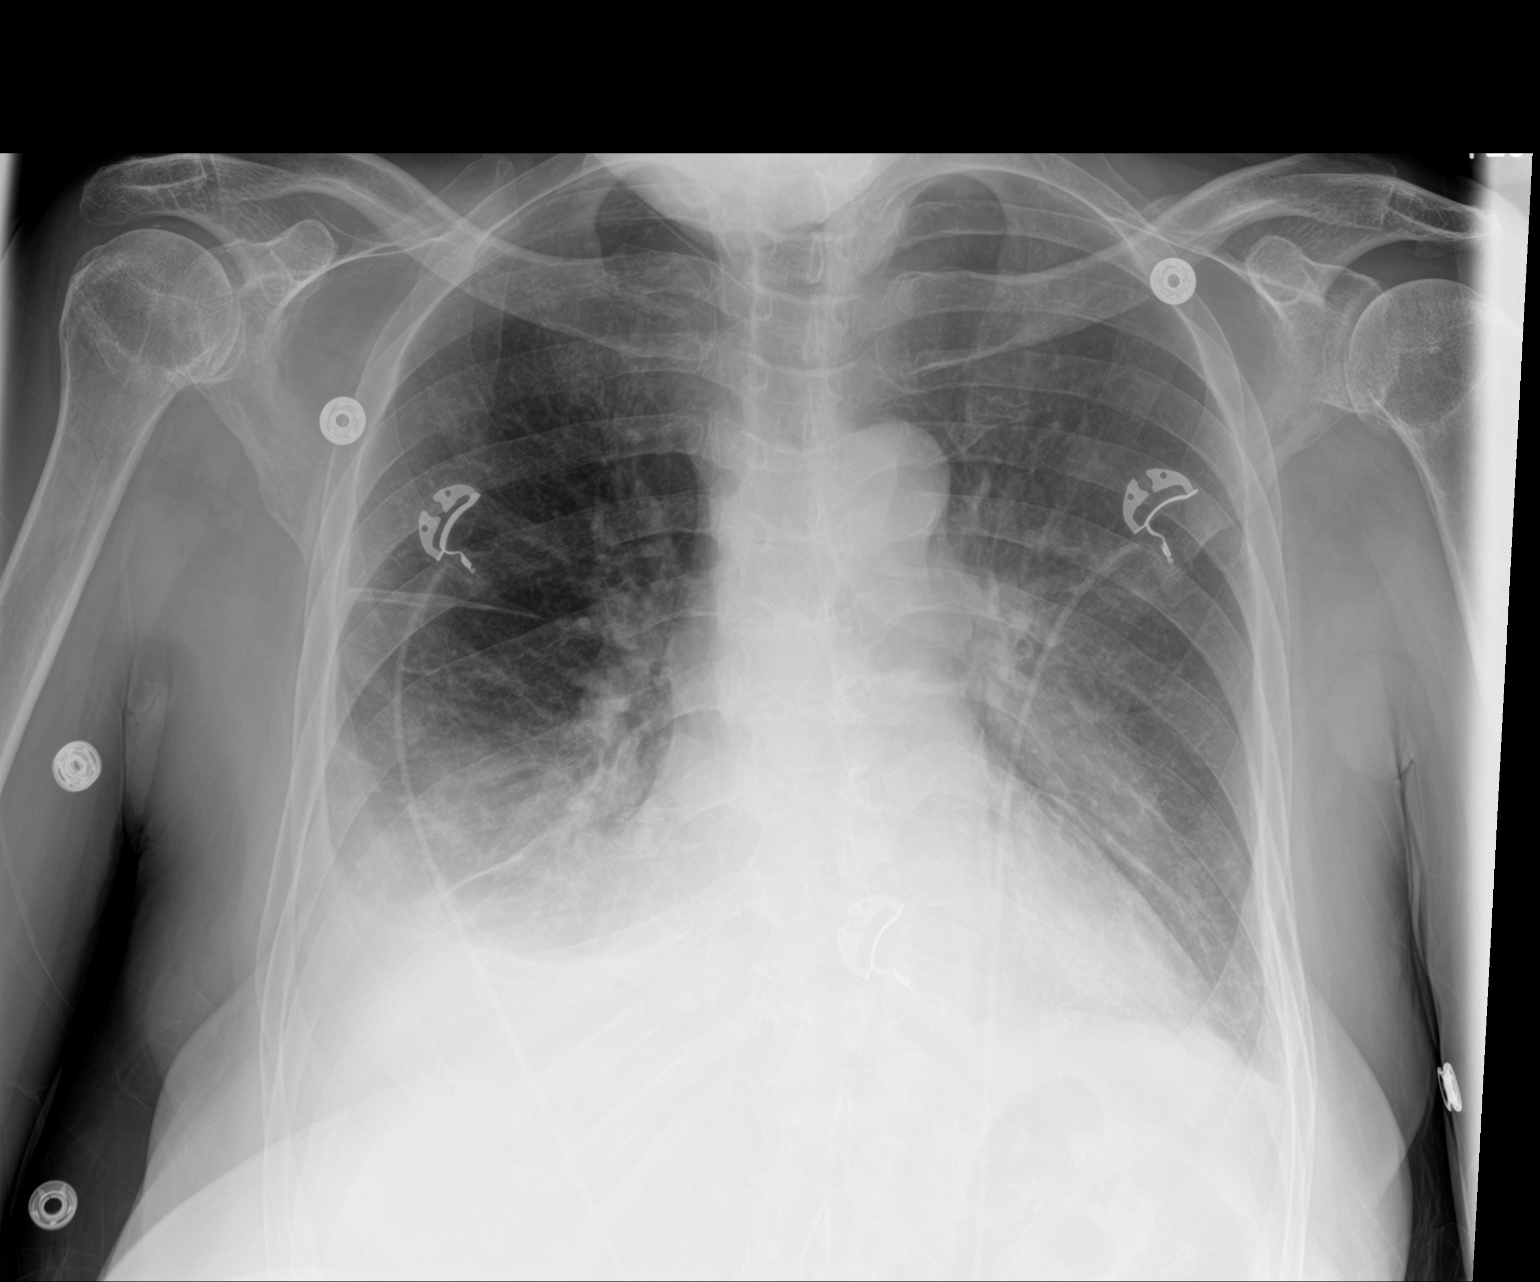

[chest lat]
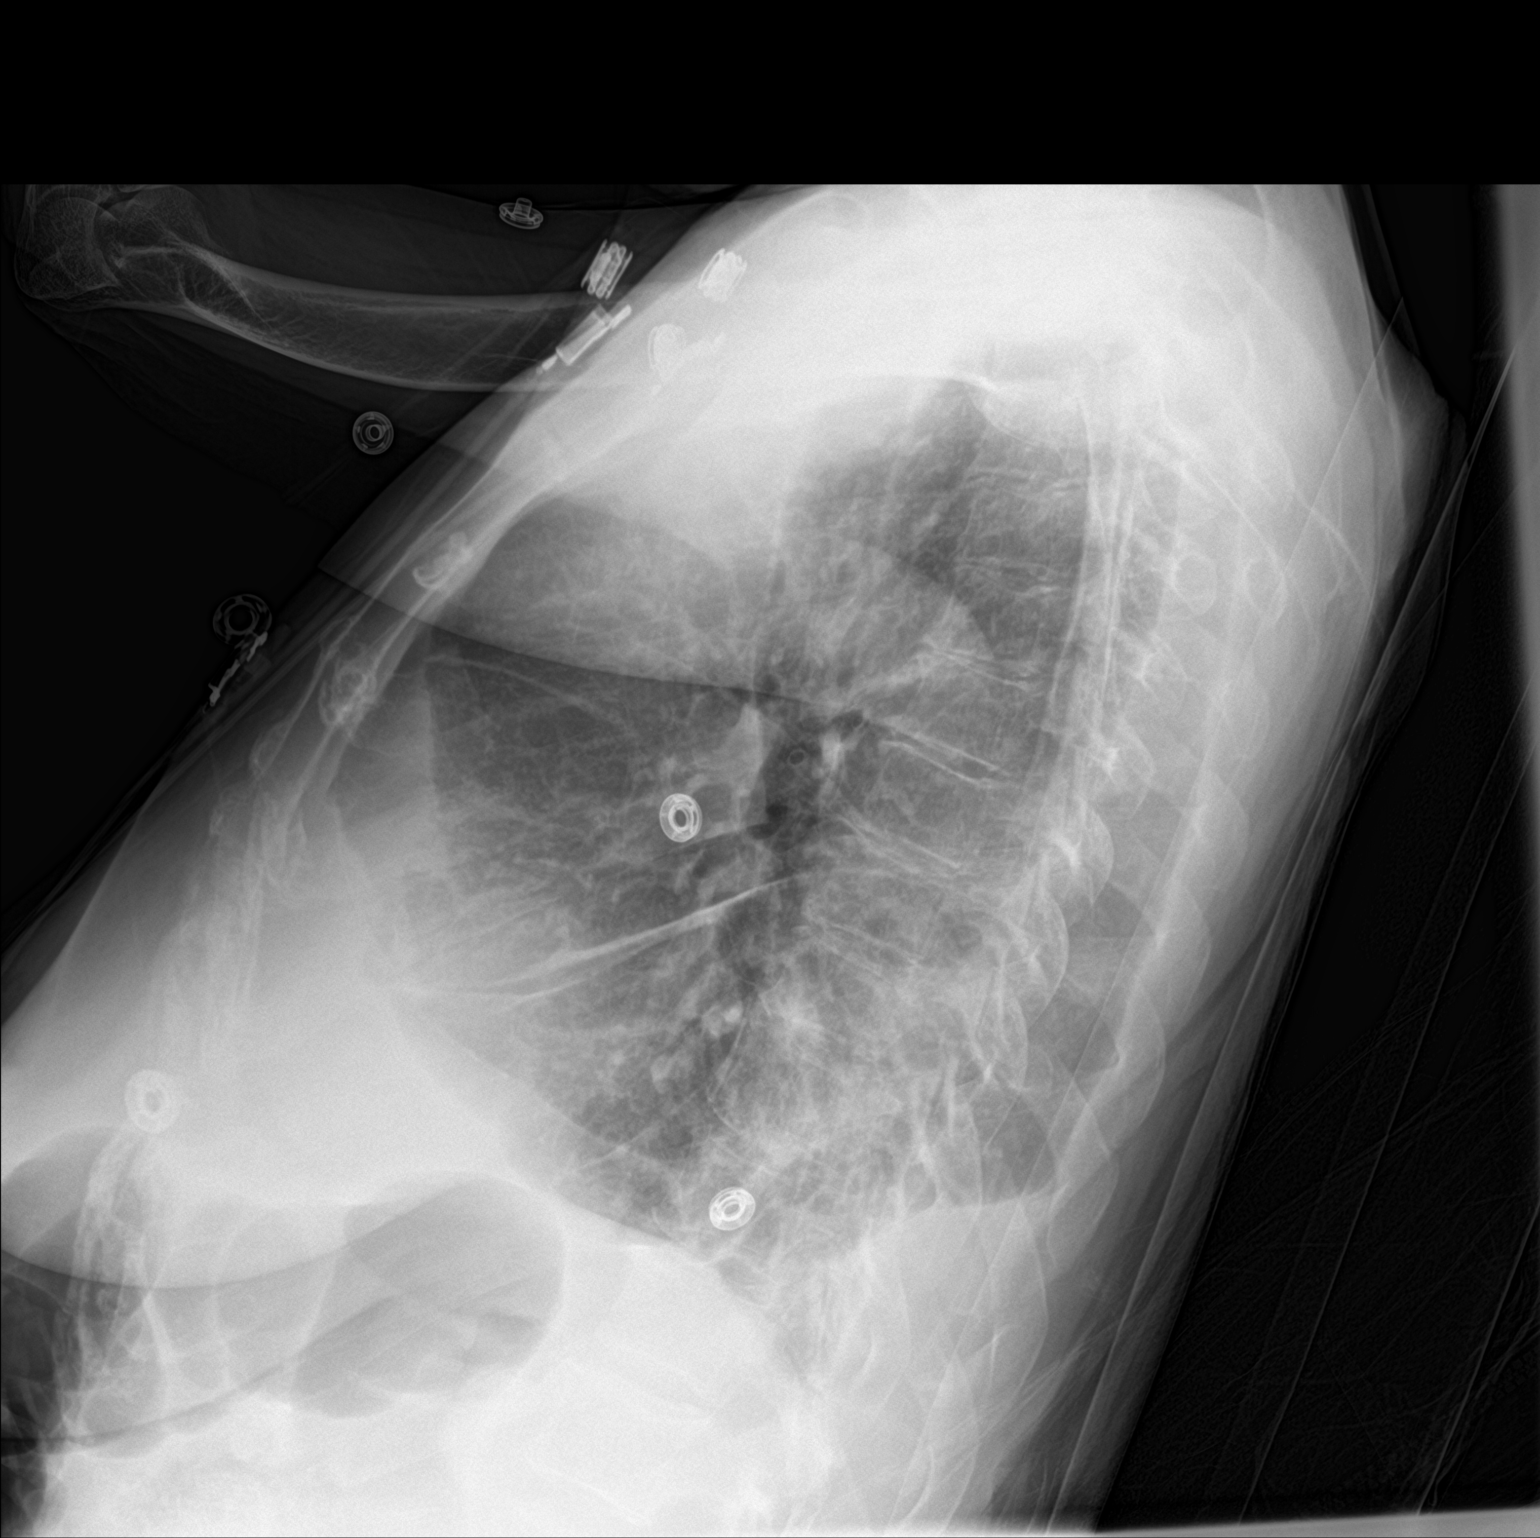

[2 of 2 positions shown; findings below may reference images not displayed]

FINDINGS: Decreased right pleural effusion and adjacent
atelectasis/consolidation. Decreased left pleural effusion and
improved aeration at the left lung base. Stable to decreased patchy
perihilar opacity. No pneumothorax. Stable cardiomediastinal
contours.
IMPRESSION: Decreased pleural effusions with improved aeration at the lung
bases.

## 2020-10-03 DIAGNOSIS — R531 Weakness: Secondary | ICD-10-CM | POA: Diagnosis not present

## 2020-10-03 DIAGNOSIS — M25562 Pain in left knee: Secondary | ICD-10-CM | POA: Diagnosis not present

## 2020-10-04 ENCOUNTER — Ambulatory Visit: Payer: Medicaid Other | Admitting: Family Medicine

## 2020-10-31 DIAGNOSIS — R531 Weakness: Secondary | ICD-10-CM | POA: Diagnosis not present

## 2020-10-31 DIAGNOSIS — M25562 Pain in left knee: Secondary | ICD-10-CM | POA: Diagnosis not present

## 2020-12-01 DIAGNOSIS — R531 Weakness: Secondary | ICD-10-CM | POA: Diagnosis not present

## 2020-12-01 DIAGNOSIS — M25562 Pain in left knee: Secondary | ICD-10-CM | POA: Diagnosis not present

## 2020-12-31 DIAGNOSIS — R531 Weakness: Secondary | ICD-10-CM | POA: Diagnosis not present

## 2020-12-31 DIAGNOSIS — M25562 Pain in left knee: Secondary | ICD-10-CM | POA: Diagnosis not present

## 2021-03-12 ENCOUNTER — Encounter (HOSPITAL_COMMUNITY): Payer: Self-pay

## 2021-03-12 ENCOUNTER — Emergency Department (HOSPITAL_COMMUNITY)
Admission: EM | Admit: 2021-03-12 | Discharge: 2021-03-12 | Disposition: A | Discharge status: Home / Self Care | Payer: Medicaid Other | Attending: Emergency Medicine | Admitting: Emergency Medicine

## 2021-03-12 ENCOUNTER — Other Ambulatory Visit: Payer: Self-pay

## 2021-03-12 ENCOUNTER — Telehealth: Payer: Self-pay

## 2021-03-12 ENCOUNTER — Ambulatory Visit (HOSPITAL_COMMUNITY)
Admission: EM | Admit: 2021-03-12 | Discharge: 2021-03-12 | Disposition: A | Discharge status: Home / Self Care | Payer: Medicaid Other

## 2021-03-12 DIAGNOSIS — Z5321 Procedure and treatment not carried out due to patient leaving prior to being seen by health care provider: Secondary | ICD-10-CM | POA: Diagnosis not present

## 2021-03-12 NOTE — Telephone Encounter (Signed)
Pts SGO came into the office stating that the pt fell and hurt her right hand it is very swollen and discolored and she is also having extreme pain in her leg that she had a total knee replacement on.   Luke nor dr dean have any openings available please advise

## 2021-03-12 NOTE — ED Triage Notes (Signed)
Pt presents with swelling and pain in the right hand and left knee after falling. Pt reports she had a left knee surgery 5 months ago.

## 2021-03-12 NOTE — ED Notes (Signed)
Has been called in lobby multiple times with no response

## 2021-03-13 ENCOUNTER — Encounter: Payer: Self-pay | Admitting: Surgical

## 2021-03-13 ENCOUNTER — Ambulatory Visit (INDEPENDENT_AMBULATORY_CARE_PROVIDER_SITE_OTHER): Payer: Medicaid Other

## 2021-03-13 ENCOUNTER — Ambulatory Visit (INDEPENDENT_AMBULATORY_CARE_PROVIDER_SITE_OTHER): Payer: Medicaid Other | Admitting: Surgical

## 2021-03-13 DIAGNOSIS — Z96659 Presence of unspecified artificial knee joint: Secondary | ICD-10-CM | POA: Diagnosis not present

## 2021-03-13 DIAGNOSIS — M79641 Pain in right hand: Secondary | ICD-10-CM | POA: Diagnosis not present

## 2021-03-13 DIAGNOSIS — M978XXA Periprosthetic fracture around other internal prosthetic joint, initial encounter: Secondary | ICD-10-CM | POA: Diagnosis not present

## 2021-03-13 DIAGNOSIS — S62609A Fracture of unspecified phalanx of unspecified finger, initial encounter for closed fracture: Secondary | ICD-10-CM

## 2021-03-13 DIAGNOSIS — M79605 Pain in left leg: Secondary | ICD-10-CM | POA: Diagnosis not present

## 2021-03-13 NOTE — Progress Notes (Addendum)
Office Visit Note   Patient: Angel Hebert           Date of Birth: 06/19/1956           MRN: [id number] Visit Date: 03/13/2021 Requested by: Charlott Rakes, MD [place],  [place] [address] PCP: Charlott Rakes, MD  Subjective: Chief Complaint  Patient presents with   Left Leg - Pain   Right Hand - Pain    HPI: Angel Hebert is a 65 y.o. female who presents to the office complaining of left leg and right hand pain.  Patient complains of continued left knee instability.  She has history of of prior left periprosthetic distal femur fracture that was treated with retrograde intramedullary nail on 04/17/2020 by Dr. Marlou Sa.  She followed up until 3 weeks after procedure and had a little bit of increased tilting of the femoral prosthesis relative to the femoral shaft compared with prior radiographs and was instructed to remain toe-touch weightbearing and follow-up 3 weeks later for new x-ray check.  She did not ever follow-up and now returns today complaining of left knee instability and multiple falls.  The left knee gives out on her when she walks especially when she is walking downhill.  She feels the leg hyperextends and buckles on her and she feels she has to walk with a walker due to the knee instability.  She denies any fevers, chills, night sweats, drainage from the incision from recent left knee surgery.  She does not really have a lot of left knee pain or any increased pain since her recent fall on Saturday and the left knee but she is mostly concerned with the recurrent instability.  She denies any groin pain or hip pain  She did sustain most recent fall from her knee giving out on her on Saturday.  She fell and caught her self with her right hand.  She notes swelling and pain to the right fourth finger that she was not experiencing prior to the fall.  She denies any wrist pain or elbow pain.  Localizes all of her pain to the right ring finger.  She is not able to  make a fist whereas she was able to prior.  Denies any previous surgery to her right hand.              ROS: All systems reviewed are negative as they relate to the chief complaint within the history of present illness.  Patient denies fevers or chills.  Assessment & Plan: Visit Diagnoses:  1. Periprosthetic supracondylar fracture of femur, initial encounter   2. Pain in left leg   3. Pain in right hand   4. Closed fractures of multiple sites of phalanx of finger of right hand, initial encounter     Plan: Patient is a 65 year old female who presents complaining of left knee instability and right hand pain.  She has history of left knee periprosthetic distal femur fracture that was treated with retrograde intramedullary nail by Dr. Marlou Sa on 04/17/2020.  She was lost to follow-up despite strict recommendations and precautions and began full weightbearing before recommended timeframe.  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  She is not the optimal surgical candidate so plan to have patient  fitted for Bledsoe brace to set from 10 degrees of extension to unrestricted of knee flexion and plan to have her referred to physical therapy to focus on quadricep strengthening exercises and gait training.  Hopefully this brace will prevent any more falls by giving a barrier to the hyperextension and providing some varus/valgus stability.  She will follow-up in 4 to 6 weeks for clinical recheck to evaluate her gait and see how she is progressing.  If no improvement and continuing to fall, consider distal femur replacement.  She does have history of alcohol abuse which may be contributing to some of these falls and she has left AMA at her prior hospitalization  following her knee surgery.  Regarding the right hand, radiographs today show severe arthritis of the right wrist but no observable acute injury.  There are fractures of the base of the proximal phalanges of the ring and small fingers that do not extend into the articular surface.  Proximal phalanx fracture of the ring finger in particular has about 27 degrees of angulation in the PA radiograph and 16 degrees of angulation on the lateral view.  Plan to refer patient to hand surgery for their input and expert management.  Follow-up with Dr. Marlou Sa in 6 weeks for clinical recheck on her left knee instability  Follow-Up Instructions: No follow-ups on file.   Orders:  Orders Placed This Encounter  Procedures   XR Knee 1-2 Views Left   XR FEMUR MIN 2 VIEWS LEFT   XR Hand Complete Right   Ambulatory referral to Orthopedic Surgery   Ambulatory referral to Physical Therapy   No orders of the defined types were placed in this encounter.     Procedures: No procedures performed   Clinical Data: No additional findings.  Objective: Vital Signs: There were no vitals taken for this visit.  Physical Exam:  Constitutional: Patient appears well-developed HEENT:  Head: Normocephalic Eyes:EOM are normal Neck: Normal range of motion Cardiovascular: Normal rate Pulmonary/chest: Effort normal Neurologic: Patient is alert Skin: Skin is warm Psychiatric: Patient has normal mood and affect  Ortho Exam: Ortho exam demonstrates left knee with well-healed incision from prior surgery.  No pain with hip range of motion (internal/external rotation and hip flexion passively).  She is able to perform straight leg raise but does have some mild weakness with isolated quadricep strength testing.  No calf tenderness.  Negative Homans' sign.  She hyperextends to about 10 degrees that is asymmetric compared with contralateral side.  She flexes to greater than 90 degrees.  She has laxity of the left knee with valgus  stressing at 0 and 30 degrees as not present on the contralateral knee.  No laxity to varus stress testing.  Fairly stable at 90 degrees with anterior and posterior drawer testing.  She is able to ambulate without a walker but she does have posterior thrust with hyperextension while she is walking and does seem somewhat unstable while walking.  No tenderness through the right wrist with no tenderness over the ulnar fovea, anatomic snuffbox, 3-4 portal, 4-5 portal, scaphoid tubercle, first CMC joint.  No pain with thumb circumduction.  Swelling observed in the ulnar aspect of the left wrist but no swelling noted through the radial aspect of the wrist.  Active wrist extension, EPL, finger abduction, finger adduction intact.  Swelling noted through the right ring finger.  Tenderness through the fourth MCP joint and the shaft of the proximal phalanx of the ring finger.  She is not able to  make composite fist due to pain and swelling.    Specialty Comments:  No specialty comments available.  Imaging: No results found.   PMFS History: Patient Active Problem List   Diagnosis Date Noted   Periprosthetic fracture around internal prosthetic left knee joint 04/17/2020   Closed fracture of left distal femur ([place]) 04/16/2020   Symptomatic anemia 07/12/2019   Lobar pneumonia ([place]) 07/12/2019   Insomnia 03/03/2017   History of total knee arthroplasty, left 11/27/2016   Arthritis of knee 10/01/2016   Physical deconditioning    Anemia, iron deficiency    Pressure ulcer 10/14/2015   CAP (community acquired pneumonia)    Sepsis due to Streptococcus pneumoniae ([place])    Hyperlipidemia 03/02/2007   Essential hypertension 03/02/2007   Allergic rhinitis 03/02/2007   Asthma 03/02/2007   GERD 03/02/2007   Past Medical History:  Diagnosis Date   Arthritis    Asthma    last year in December   Dyspnea    GERD (gastroesophageal reflux disease)    High cholesterol    Hypertension    Pneumonia 01/2017     Family History  Problem Relation Age of Onset   Diabetes Sister    Healthy Mother    Healthy Father    Colon cancer Neg Hx    Breast cancer Neg Hx     Past Surgical History:  Procedure Laterality Date   ABDOMINAL HYSTERECTOMY     FEMUR IM NAIL Left 04/17/2020   Procedure: INTRAMEDULLARY (IM) RETROGRADE FEMORAL NAILING;  Surgeon: Meredith Pel, MD;  Location: [facility];  Service: Orthopedics;  Laterality: Left;   nasal polyps     removed just this past tuesday at [facility] facility over by [place] [facility] Left 10/01/2016   Procedure: LEFT TOTAL KNEE ARTHROPLASTY;  Surgeon: Meredith Pel, MD;  Location: [facility];  Service: Orthopedics;  Laterality: Left;   Social History   Occupational History   Not on file  Tobacco Use   Smoking status: Never   Smokeless tobacco: Never  Vaping Use   Vaping Use: Never used  Substance and Sexual Activity   Alcohol use: Yes    Alcohol/week: 1.0 standard drink    Types: 1 Shots of liquor per week    Comment: socially   Drug use: No   Sexual activity: Not on file

## 2021-03-16 DIAGNOSIS — M2352 Chronic instability of knee, left knee: Secondary | ICD-10-CM | POA: Diagnosis not present

## 2021-03-26 ENCOUNTER — Telehealth: Payer: Self-pay | Admitting: Orthopedic Surgery

## 2021-03-26 NOTE — Telephone Encounter (Signed)
Patient called. Says she needs a disc with her xray's on it to take to her appointment with the hand surgeon. Her call back number is [phone]

## 2021-03-27 ENCOUNTER — Telehealth: Payer: Self-pay | Admitting: Orthopedic Surgery

## 2021-03-27 NOTE — Telephone Encounter (Signed)
I called and advised, CD at front desk for pick up.

## 2021-03-27 NOTE — Telephone Encounter (Signed)
See other note. Betsy to help with this and reach out to patient once complete.

## 2021-03-27 NOTE — Telephone Encounter (Signed)
PT needs xray ready by tomorrow (03/27/2021) because her appt is thursday the 28th!   CB [phone]

## 2021-03-27 NOTE — Telephone Encounter (Signed)
I dont know how to burn disc yet

## 2021-03-29 ENCOUNTER — Telehealth (INDEPENDENT_AMBULATORY_CARE_PROVIDER_SITE_OTHER): Payer: Self-pay

## 2021-03-29 DIAGNOSIS — S52591P Other fractures of lower end of right radius, subsequent encounter for closed fracture with malunion: Secondary | ICD-10-CM | POA: Diagnosis not present

## 2021-03-29 DIAGNOSIS — M1811 Unilateral primary osteoarthritis of first carpometacarpal joint, right hand: Secondary | ICD-10-CM | POA: Diagnosis not present

## 2021-03-29 DIAGNOSIS — S62614A Displaced fracture of proximal phalanx of right ring finger, initial encounter for closed fracture: Secondary | ICD-10-CM | POA: Diagnosis not present

## 2021-03-29 NOTE — Telephone Encounter (Signed)
Patient is aware that unable to print photo. No copy of photo id on file. Nat Christen, CMA     Copied from [place] [phone]. Topic: General - Other >> Mar 29, 2021  1:12 PM Yvette Rack wrote: Reason for CRM: Pt significant other Jonah stated pt is having surgery tomorrow and they need a copy of pt photo that is on file with [facility]. Jonah requests call back. Cb# [phone]

## 2021-03-30 DIAGNOSIS — X58XXXA Exposure to other specified factors, initial encounter: Secondary | ICD-10-CM | POA: Diagnosis not present

## 2021-03-30 DIAGNOSIS — S62614A Displaced fracture of proximal phalanx of right ring finger, initial encounter for closed fracture: Secondary | ICD-10-CM | POA: Diagnosis not present

## 2021-03-30 DIAGNOSIS — Y999 Unspecified external cause status: Secondary | ICD-10-CM | POA: Diagnosis not present

## 2021-03-31 ENCOUNTER — Ambulatory Visit: Payer: Medicaid Other | Attending: Surgical | Admitting: Physical Therapy

## 2021-04-03 DIAGNOSIS — S62614A Displaced fracture of proximal phalanx of right ring finger, initial encounter for closed fracture: Secondary | ICD-10-CM | POA: Diagnosis not present

## 2021-04-03 DIAGNOSIS — Z4789 Encounter for other orthopedic aftercare: Secondary | ICD-10-CM | POA: Diagnosis not present

## 2021-04-11 ENCOUNTER — Ambulatory Visit: Payer: Medicaid Other | Admitting: Orthopedic Surgery

## 2021-04-17 DIAGNOSIS — S62614A Displaced fracture of proximal phalanx of right ring finger, initial encounter for closed fracture: Secondary | ICD-10-CM | POA: Diagnosis not present

## 2021-04-17 DIAGNOSIS — Z4789 Encounter for other orthopedic aftercare: Secondary | ICD-10-CM | POA: Diagnosis not present

## 2021-04-19 ENCOUNTER — Encounter: Payer: Self-pay | Admitting: Orthopedic Surgery

## 2021-04-19 ENCOUNTER — Other Ambulatory Visit: Payer: Self-pay

## 2021-04-19 ENCOUNTER — Ambulatory Visit (INDEPENDENT_AMBULATORY_CARE_PROVIDER_SITE_OTHER): Payer: Medicaid Other | Admitting: Orthopedic Surgery

## 2021-04-19 DIAGNOSIS — M79605 Pain in left leg: Secondary | ICD-10-CM

## 2021-04-19 MED ORDER — OXYCODONE-ACETAMINOPHEN 5-325 MG PO TABS: 1.0000 | ORAL_TABLET | Freq: Two times a day (BID) | ORAL | 0 refills | Status: DC | PRN

## 2021-04-19 NOTE — Progress Notes (Signed)
Office Visit Note   Patient: Angel Hebert           Date of Birth: 1956-07-09           MRN: [id number] Visit Date: 04/19/2021 Requested by: Charlott Rakes, MD [place],  [place] [id number] PCP: Charlott Rakes, MD  Subjective: Chief Complaint  Patient presents with   Left Leg - Follow-up    HPI: Patient presents for follow-up of left leg.  She had IM nail 04/17/2020 for periprosthetic distal femur fracture.  She did not come back after her 1 week postop visit.  I think it is likely she was walking on it during that time and it has since healed in extension.  She has been ambulating in a Bledsoe brace.  The Bledsoe brace has been set at 50 which is a little bit uncomfortable for her.  She did fall the other day.  Requesting pain medicine.              ROS: All systems reviewed are negative as they relate to the chief complaint within the history of present illness.  Patient denies  fevers or chills.   Assessment & Plan: Visit Diagnoses:  1. 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  Essentially her options are to live with this brace the rest of her life versus nail removal and osteotomy versus distal femur replacement.  Hard to know exactly which of those choices is the best 1.  The brace is adjusted today to go to 30 degrees which will allow her a little bit more comfortable type ambulation.  Since she has been in the brace and knows what it is like I think she has enough information to decide whether or not she wants to pursue operative intervention.  That could result in loss of limb if there is complication and this is discussed with Angel Hebert and her partner today.  Follow-up with my partner Dr. Erlinda Hong in 1 to 2 weeks.  Follow-Up Instructions: No follow-ups on file.   Orders:  No orders of the defined types were placed in this encounter.  Meds ordered this encounter  Medications    DISCONTD: oxyCODONE-acetaminophen (PERCOCET/ROXICET) 5-325 MG tablet    Sig: Take 1 tablet by mouth every 12 (twelve) hours as needed for severe pain.    Dispense:  30 tablet    Refill:  0   oxyCODONE-acetaminophen (PERCOCET/ROXICET) 5-325 MG tablet    Sig: Take 1 tablet by mouth every 12 (twelve) hours as needed for severe pain.    Dispense:  30 tablet    Refill:  0      Procedures: No procedures performed   Clinical Data: No additional findings.  Objective: Vital Signs: There were no vitals taken for this visit.  Physical Exam:   Constitutional: Patient appears well-developed HEENT:  Head: Normocephalic Eyes:EOM are normal Neck: Normal range of motion Cardiovascular: Normal rate Pulmonary/chest: Effort normal Neurologic: Patient is alert Skin: Skin is warm Psychiatric: Patient has normal mood and affect   Ortho Exam: Ortho exam demonstrates intact ankle dorsiflexion plantarflexion in both feet.  Pedal pulses palpable.  Patient has hyperextension deformity of that left knee.  Flexes to about 95.  With the brace at 30 she can walk with the leg relatively straight but the brace does have a tendency to slide down.  Extensor strength is pretty good and there is no effusion in the  knee.  Specialty Comments:  No specialty comments available.  Imaging: No results found.   PMFS History: Patient Active Problem List   Diagnosis Date Noted   Periprosthetic fracture around internal prosthetic left knee joint 04/17/2020   Closed fracture of left distal femur ([place]) 04/16/2020   Symptomatic anemia 07/12/2019   Lobar pneumonia ([place]) 07/12/2019   Insomnia 03/03/2017   History of total knee arthroplasty, left 11/27/2016   Arthritis of knee 10/01/2016   Physical deconditioning    Anemia, iron deficiency    Pressure ulcer 10/14/2015   CAP (community acquired pneumonia)    Sepsis due to Streptococcus pneumoniae ([place])    Hyperlipidemia 03/02/2007   Essential hypertension  03/02/2007   Allergic rhinitis 03/02/2007   Asthma 03/02/2007   GERD 03/02/2007   Past Medical History:  Diagnosis Date   Arthritis    Asthma    last year in December   Dyspnea    GERD (gastroesophageal reflux disease)    High cholesterol    Hypertension    Pneumonia 01/2017    Family History  Problem Relation Age of Onset   Diabetes Sister    Healthy Mother    Healthy Father    Colon cancer Neg Hx    Breast cancer Neg Hx     Past Surgical History:  Procedure Laterality Date   ABDOMINAL HYSTERECTOMY     FEMUR IM NAIL Left 04/17/2020   Procedure: INTRAMEDULLARY (IM) RETROGRADE FEMORAL NAILING;  Surgeon: Meredith Pel, MD;  Location: [facility];  Service: Orthopedics;  Laterality: Left;   nasal polyps     removed just this past tuesday at [facility] facility over by [facility] Left 10/01/2016   Procedure: LEFT TOTAL KNEE ARTHROPLASTY;  Surgeon: Meredith Pel, MD;  Location: [facility];  Service: Orthopedics;  Laterality: Left;   Social History   Occupational History   Not on file  Tobacco Use   Smoking status: Never   Smokeless tobacco: Never  Vaping Use   Vaping Use: Never used  Substance and Sexual Activity   Alcohol use: Yes    Alcohol/week: 1.0 standard drink    Types: 1 Shots of liquor per week    Comment: socially   Drug use: No   Sexual activity: Not on file

## 2021-04-20 ENCOUNTER — Telehealth: Payer: Self-pay | Admitting: Orthopedic Surgery

## 2021-04-20 NOTE — Telephone Encounter (Signed)
Attempted to call pt 2x today to set an appt with Dr. Erlinda Hong referred from Dr. Marlou Sa. Pt phone number was invalid. Will try again another time.

## 2021-04-26 ENCOUNTER — Telehealth: Payer: Self-pay | Admitting: Orthopaedic Surgery

## 2021-04-26 NOTE — Telephone Encounter (Signed)
Called pt 1X and was unable to leave a vm for on pt voicemail . VM is not set up. Per Dr. Marlou Sa he would like for pt to see Dr. Erlinda Hong.

## 2021-05-01 DIAGNOSIS — S62614D Displaced fracture of proximal phalanx of right ring finger, subsequent encounter for fracture with routine healing: Secondary | ICD-10-CM | POA: Diagnosis not present

## 2021-05-21 ENCOUNTER — Ambulatory Visit: Payer: Self-pay

## 2021-05-21 NOTE — Telephone Encounter (Signed)
Tried to call patient to schedule appointment. Unable to reach patient and no voicemail.

## 2021-05-21 NOTE — Telephone Encounter (Signed)
Pt. Reports she has had abdominal pain x 2 weeks.Pain comes and goes. Pain is at her belly button. No diarrhea, vomiting, constipation. No fever. Declines mobile unit. Requests appointment with Dr. Margarita Rana. Please advise.    Answer Assessment - Initial Assessment Questions 1. LOCATION: "Where does it hurt?"      Belly button 2. RADIATION: "Does the pain shoot anywhere else?" (e.g., chest, back)     No 3. ONSET: "When did the pain begin?" (e.g., minutes, hours or days ago)       2 weeks ago 4. SUDDEN: "Gradual or sudden onset?"     Gradual 5. PATTERN "Does the pain come and go, or is it constant?"    - If constant: "Is it getting better, staying the same, or worsening?"      (Note: Constant means the pain never goes away completely; most serious pain is constant and it progresses)     - If intermittent: "How long does it last?" "Do you have pain now?"     (Note: Intermittent means the pain goes away completely between bouts)     Comes and goes 6. SEVERITY: "How bad is the pain?"  (e.g., Scale 1-10; mild, moderate, or severe)   - MILD (1-3): doesn't interfere with normal activities, abdomen soft and not tender to touch    - MODERATE (4-7): interferes with normal activities or awakens from sleep, abdomen tender to touch    - SEVERE (8-10): excruciating pain, doubled over, unable to do any normal activities      Now - 5 7. RECURRENT SYMPTOM: "Have you ever had this type of stomach pain before?" If Yes, ask: "When was the last time?" and "What happened that time?"      No 8. CAUSE: "What do you think is causing the stomach pain?"     No 9. RELIEVING/AGGRAVATING FACTORS: "What makes it better or worse?" (e.g., movement, antacids, bowel movement)     No 10. OTHER SYMPTOMS: "Do you have any other symptoms?" (e.g., back pain, diarrhea, fever, urination pain, vomiting)       No 11. PREGNANCY: "Is there any chance you are pregnant?" "When was your last menstrual period?"       No  Protocols  used: Abdominal Pain - [facility]

## 2021-05-22 ENCOUNTER — Encounter (HOSPITAL_BASED_OUTPATIENT_CLINIC_OR_DEPARTMENT_OTHER): Payer: Self-pay | Admitting: Emergency Medicine

## 2021-05-22 ENCOUNTER — Ambulatory Visit (HOSPITAL_COMMUNITY)
Admission: EM | Admit: 2021-05-22 | Discharge: 2021-05-22 | Disposition: A | Discharge status: Home / Self Care | Payer: Medicaid Other

## 2021-05-22 ENCOUNTER — Other Ambulatory Visit: Payer: Self-pay

## 2021-05-22 ENCOUNTER — Emergency Department (HOSPITAL_BASED_OUTPATIENT_CLINIC_OR_DEPARTMENT_OTHER): Payer: Medicaid Other

## 2021-05-22 ENCOUNTER — Emergency Department (HOSPITAL_BASED_OUTPATIENT_CLINIC_OR_DEPARTMENT_OTHER)
Admission: EM | Admit: 2021-05-22 | Discharge: 2021-05-22 | Disposition: A | Discharge status: Home / Self Care | Payer: Medicaid Other | Attending: Emergency Medicine | Admitting: Emergency Medicine

## 2021-05-22 ENCOUNTER — Ambulatory Visit: Payer: Self-pay | Admitting: *Deleted

## 2021-05-22 DIAGNOSIS — R1033 Periumbilical pain: Secondary | ICD-10-CM

## 2021-05-22 DIAGNOSIS — Z96652 Presence of left artificial knee joint: Secondary | ICD-10-CM | POA: Diagnosis not present

## 2021-05-22 DIAGNOSIS — J45909 Unspecified asthma, uncomplicated: Secondary | ICD-10-CM | POA: Insufficient documentation

## 2021-05-22 DIAGNOSIS — I10 Essential (primary) hypertension: Secondary | ICD-10-CM | POA: Insufficient documentation

## 2021-05-22 DIAGNOSIS — Z79899 Other long term (current) drug therapy: Secondary | ICD-10-CM | POA: Insufficient documentation

## 2021-05-22 DIAGNOSIS — K838 Other specified diseases of biliary tract: Secondary | ICD-10-CM | POA: Diagnosis not present

## 2021-05-22 DIAGNOSIS — K8689 Other specified diseases of pancreas: Secondary | ICD-10-CM | POA: Diagnosis not present

## 2021-05-22 DIAGNOSIS — R109 Unspecified abdominal pain: Secondary | ICD-10-CM | POA: Diagnosis not present

## 2021-05-22 LAB — CBC
HCT: 43.5 % (ref 36.0–46.0)
Hemoglobin: 14.7 g/dL (ref 12.0–15.0)
MCH: 32.1 pg (ref 26.0–34.0)
MCHC: 33.8 g/dL (ref 30.0–36.0)
MCV: 95 fL (ref 80.0–100.0)
Platelets: 382 10*3/uL (ref 150–400)
RBC: 4.58 MIL/uL (ref 3.87–5.11)
RDW: 15.6 % — ABNORMAL HIGH (ref 11.5–15.5)
WBC: 5.5 10*3/uL (ref 4.0–10.5)
nRBC: 0 % (ref 0.0–0.2)

## 2021-05-22 LAB — URINALYSIS, ROUTINE W REFLEX MICROSCOPIC
Bilirubin Urine: NEGATIVE
Glucose, UA: NEGATIVE mg/dL
Hgb urine dipstick: NEGATIVE
Ketones, ur: NEGATIVE mg/dL
Nitrite: NEGATIVE
Specific Gravity, Urine: 1.02 (ref 1.005–1.030)
pH: 8 (ref 5.0–8.0)

## 2021-05-22 LAB — COMPREHENSIVE METABOLIC PANEL
ALT: 5 U/L (ref 0–44)
AST: 17 U/L (ref 15–41)
Albumin: 3.8 g/dL (ref 3.5–5.0)
Alkaline Phosphatase: 106 U/L (ref 38–126)
Anion gap: 10 (ref 5–15)
BUN: 9 mg/dL (ref 8–23)
CO2: 28 mmol/L (ref 22–32)
Calcium: 9.8 mg/dL (ref 8.9–10.3)
Chloride: 99 mmol/L (ref 98–111)
Creatinine, Ser: 0.55 mg/dL (ref 0.44–1.00)
GFR, Estimated: >60 mL/min (ref >60)
Glucose, Bld: 102 mg/dL — ABNORMAL HIGH (ref 70–99)
Potassium: 3.6 mmol/L (ref 3.5–5.1)
Sodium: 137 mmol/L (ref 135–145)
Total Bilirubin: 0.6 mg/dL (ref 0.3–1.2)
Total Protein: 7.8 g/dL (ref 6.5–8.1)

## 2021-05-22 LAB — LIPASE, BLOOD: Lipase: 33 U/L (ref 11–51)

## 2021-05-22 LAB — OCCULT BLOOD X 1 CARD TO LAB, STOOL: Fecal Occult Bld: NEGATIVE

## 2021-05-22 MED ORDER — DICYCLOMINE HCL 20 MG PO TABS: 20.0000 mg | ORAL_TABLET | Freq: Two times a day (BID) | ORAL | 0 refills | Status: DC

## 2021-05-22 MED ORDER — DICYCLOMINE HCL 10 MG/ML IM SOLN
20.0000 mg | Freq: Once | INTRAMUSCULAR | Status: AC
  Administered 2021-05-22: 20 mg via INTRAMUSCULAR
  Filled 2021-05-22: qty 2

## 2021-05-22 MED ORDER — MORPHINE SULFATE (PF) 4 MG/ML IV SOLN
4.0000 mg | Freq: Once | INTRAVENOUS | Status: AC
  Administered 2021-05-22: 4 mg via INTRAVENOUS
  Filled 2021-05-22: qty 1

## 2021-05-22 MED ORDER — IOHEXOL 350 MG/ML SOLN
75.0000 mL | Freq: Once | INTRAVENOUS | Status: AC | PRN
  Administered 2021-05-22: 75 mL via INTRAVENOUS

## 2021-05-22 NOTE — ED Triage Notes (Addendum)
Pt arrives to ED with c/o of generalized abdominal pain x2 weeks. The pain started while she was at rest. No alleviating factors. When pt eats it makes the subside for a short time. Pt reports taking ibuprofen every day for the past two weeks, every 8 hours for right hand pain. Pt also states new onset of melena. The stools are dark, BM q other day. Pt has been taking Pepto Bislol x5 days. No N/V/D. No vaginal bleeding. No dysuria. Pt has currently stopped taking all prescription medication months back.

## 2021-05-22 NOTE — ED Notes (Signed)
At bedside w/ PA for exam at this time.

## 2021-05-22 NOTE — ED Provider Notes (Signed)
[facility] EMERGENCY DEPT Provider Note   CSN: [id number] Arrival date & time: 05/22/21  1226     History Chief Complaint  Patient presents with   Abdominal Pain    Angel Hebert is a 65 y.o. female with a past medical history significant for asthma, GERD, hyperlipidemia, and hypertension who presents to the ED due to persistent abdominal pain x2 weeks.  Abdominal pain is located around her umbilicus.  Denies associated nausea, vomiting, and diarrhea.  Patient admits to dark stool that started yesterday.  She has been taking chronic ibuprofen for her right hand.  She has also been taking Pepto-Bismol.  No chest pain or shortness of breath.  Denies lightheadedness and palpitations.  No previous GI bleeds.  No previous abdominal operations.  No vaginal or urinary symptoms.  No aggravating or alleviating factors.  History obtained from patient and past medical records. No interpreter used during encounter.       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   Arthritis of knee 10/01/2016   Physical deconditioning    Anemia, iron deficiency    Pressure ulcer 10/14/2015   CAP (community acquired pneumonia)    Sepsis due to Streptococcus pneumoniae ([place])    Hyperlipidemia 03/02/2007   Essential hypertension 03/02/2007   Allergic rhinitis 03/02/2007   Asthma 03/02/2007   GERD 03/02/2007    Past Surgical History:  Procedure Laterality Date   ABDOMINAL HYSTERECTOMY     FEMUR  IM NAIL Left 04/17/2020   Procedure: INTRAMEDULLARY (IM) RETROGRADE FEMORAL NAILING;  Surgeon: Meredith Pel, MD;  Location: [facility];  Service: Orthopedics;  Laterality: Left;   nasal polyps     removed just this past tuesday at [facility] facility over by [place] Left 10/01/2016   Procedure: LEFT TOTAL KNEE ARTHROPLASTY;  Surgeon: Meredith Pel, MD;  Location: [facility];  Service: Orthopedics;  Laterality: Left;     OB History   No obstetric history on file.     Family History  Problem Relation Age of Onset   Diabetes Sister    Healthy Mother    Healthy Father    Colon cancer Neg Hx    Breast cancer Neg Hx     Social History   Tobacco Use   Smoking status: Never   Smokeless tobacco: Never  Vaping Use   Vaping Use: Never used  Substance Use Topics   Alcohol use: Yes    Alcohol/week: 1.0 standard drink    Types: 1 Shots of liquor per week    Comment: socially   Drug use: No    Home Medications Prior to Admission medications   Medication Sig Start Date End Date Taking? Authorizing Provider  dicyclomine (BENTYL) 20 MG tablet Take 1 tablet (20 mg total) by mouth 2 (two) times daily. 05/22/21  Yes Warda Mcqueary C, PA-C  albuterol (PROVENTIL) (2.5 MG/3ML) 0.083% nebulizer solution Take 3 mLs (2.5 mg total) by  nebulization every 6 (six) hours as needed for wheezing or shortness of breath. 08/16/20   Charlott Rakes, MD  atorvastatin (LIPITOR) 10 MG tablet Take 1 tablet (10 mg total) by mouth daily at 6 PM. Patient not taking: No sig reported 01/21/20   Charlott Rakes, MD  enoxaparin (LOVENOX) 30 MG/0.3ML injection Inject 0.3 mLs (30 mg total) into the skin daily. 04/21/20   Magnant, Charles L, PA-C  ferrous sulfate 325 (65 FE) MG EC tablet Take 1 tablet (325 mg total) by mouth 2 (two) times daily. Patient not taking: Reported on 04/16/2020 01/21/20   Charlott Rakes, MD  fluticasone (FLONASE) 50 MCG/ACT nasal spray Place 2 sprays into both nostrils  daily. Patient not taking: No sig reported 01/21/20   Charlott Rakes, MD  Fluticasone-Salmeterol (ADVAIR DISKUS) 500-50 MCG/DOSE AEPB INHALE 1 PUFF INTO THE LUNGS EVERY 12 (TWELVE) HOURS. Patient not taking: No sig reported 01/21/20   Charlott Rakes, MD  Fluticasone-Salmeterol (ADVAIR) 500-50 MCG/DOSE AEPB INHALE 1 PUFF INTO THE LUNGS EVERY 12 (TWELVE) HOURS. 01/21/20 01/20/21  Charlott Rakes, MD  furosemide (LASIX) 40 MG tablet Take 1 tablet (40 mg total) by mouth daily. 08/03/19 10/06/19  Charlott Rakes, MD  losartan-hydrochlorothiazide (HYZAAR) 100-25 MG tablet Take 1 tablet by mouth daily. Patient not taking: No sig reported 01/21/20   Charlott Rakes, MD  metoprolol succinate (TOPROL XL) 25 MG 24 hr tablet Take 1 tablet (25 mg total) by mouth daily. Patient not taking: Reported on 04/16/2020 01/21/20   Charlott Rakes, MD  montelukast (SINGULAIR) 10 MG tablet Take 1 tablet (10 mg total) by mouth at bedtime. Patient not taking: No sig reported 01/21/20   Charlott Rakes, MD  oxyCODONE (OXY IR/ROXICODONE) 5 MG immediate release tablet Take 1 tablet (5 mg total) by mouth daily as needed for moderate pain (pain score 4-6). 05/11/20   Magnant, Gerrianne Scale, PA-C  oxyCODONE-acetaminophen (PERCOCET/ROXICET) 5-325 MG tablet Take 1 tablet by mouth every 12 (twelve) hours as needed for severe pain. 04/19/21   Meredith Pel, MD  pantoprazole (PROTONIX) 20 MG tablet Take 1 tablet (20 mg total) by mouth daily. Patient not taking: No sig reported 01/21/20   Charlott Rakes, MD  potassium chloride SA (KLOR-CON) 20 MEQ tablet Take 1 tablet (20 mEq total) by mouth daily. Patient not taking: No sig reported 01/21/20   Charlott Rakes, MD  PROAIR HFA 108 (90 Base) MCG/ACT inhaler Inhale 2 puffs into the lungs every 6 (six) hours as needed for wheezing or shortness of breath. Patient not taking: No sig reported 01/21/20   Charlott Rakes, MD  PROAIR HFA 108 (90 Base) MCG/ACT inhaler INHALE 2 PUFFS INTO THE LUNGS EVERY 6  (SIX) HOURS AS NEEDED FOR WHEEZING OR SHORTNESS OF BREATH. 01/21/20 01/20/21  Charlott Rakes, MD  traZODone (DESYREL) 50 MG tablet Take 1 tablet (50 mg total) by mouth at bedtime as needed for sleep. 07/21/19 09/16/19  Aline August, MD    Allergies    Patient has no known allergies.  Review of Systems   Review of Systems  Constitutional:  Negative for chills and fever.  Respiratory:  Negative for shortness of breath.   Cardiovascular:  Negative for chest pain.  Gastrointestinal:  Positive for abdominal pain and blood in stool (dark stool). Negative for diarrhea, nausea and vomiting.  Genitourinary:  Negative for dysuria.  All other systems reviewed and are negative.  Physical Exam Updated Vital Signs BP (!) 163/96   Pulse 80   Temp 98.3 F (36.8 C) (Oral)  Resp 16   Ht 5\' 7"  (1.702 m)   Wt 65.8 kg   SpO2 100%   BMI 22.71 kg/m   Physical Exam Vitals and nursing note reviewed.  Constitutional:      General: She is not in acute distress.    Appearance: She is not ill-appearing.  HENT:     Head: Normocephalic.  Eyes:     Pupils: Pupils are equal, round, and reactive to light.  Cardiovascular:     Rate and Rhythm: Normal rate and regular rhythm.     Pulses: Normal pulses.     Heart sounds: Normal heart sounds. No murmur heard.   No friction rub. No gallop.  Pulmonary:     Effort: Pulmonary effort is normal.     Breath sounds: Normal breath sounds.  Abdominal:     General: Abdomen is flat. There is no distension.     Palpations: Abdomen is soft.     Tenderness: There is abdominal tenderness. There is no guarding or rebound.     Comments: Periumbilical tenderness without rebound or guarding. No obvious hernia.  Genitourinary:    Comments: Rectal exam performed with chaperone in room. Dark stool.  Musculoskeletal:        General: Normal range of motion.     Cervical back: Neck supple.  Skin:    General: Skin is warm and dry.  Neurological:     General: No focal  deficit present.     Mental Status: She is alert.  Psychiatric:        Mood and Affect: Mood normal.        Behavior: Behavior normal.    ED Results / Procedures / Treatments   Labs (all labs ordered are listed, but only abnormal results are displayed) Labs Reviewed  COMPREHENSIVE METABOLIC PANEL - Abnormal; Notable for the following components:      Result Value   Glucose, Bld 102 (*)    All other components within normal limits  CBC - Abnormal; Notable for the following components:   RDW 15.6 (*)    All other components within normal limits  URINALYSIS, ROUTINE W REFLEX MICROSCOPIC - Abnormal; Notable for the following components:   APPearance HAZY (*)    Protein, ur TRACE (*)    Leukocytes,Ua TRACE (*)    Bacteria, UA MANY (*)    All other components within normal limits  URINE CULTURE  LIPASE, BLOOD  OCCULT BLOOD X 1 CARD TO LAB, STOOL  POC OCCULT BLOOD, ED    EKG None  Radiology CT ABDOMEN PELVIS W CONTRAST  Result Date: 05/22/2021 CLINICAL DATA:  Acute abdominal pain. EXAM: CT ABDOMEN AND PELVIS WITH CONTRAST TECHNIQUE: Multidetector CT imaging of the abdomen and pelvis was performed using the standard protocol following bolus administration of intravenous contrast. CONTRAST:  39mL OMNIPAQUE IOHEXOL 350 MG/ML SOLN COMPARISON:  CT abdomen and pelvis 07/12/2019. FINDINGS: Lower chest: There is some atelectasis in the lung bases. Hepatobiliary: No focal liver abnormality is seen. No gallstones, gallbladder wall thickening, or biliary dilatation. Pancreas: There is mild diffuse pancreatic ductal prominence which appears similar to the prior study. No focal mass identified. No inflammatory changes are seen. Spleen: Small in size, unchanged. Adrenals/Urinary Tract: Adrenal glands are unremarkable. Kidneys are normal, without renal calculi, focal lesion, or hydronephrosis. Bladder is unremarkable. Stomach/Bowel: Stomach is within normal limits. Appendix is not seen. There are no  focal inflammatory changes. Stomach is within normal limits. There is colonic diverticulosis without evidence for acute diverticulitis. Vascular/Lymphatic:  Aortic atherosclerosis. No enlarged abdominal or pelvic lymph nodes. Reproductive: Status post hysterectomy. No adnexal masses. Other: There is a small fat containing umbilical hernia. There is no free fluid/ascites. Musculoskeletal: Radiopaque density is again seen in the right iliac bone, likely from prior trauma. Left femoral nail is partially visualized. Chronic compression deformity of L1 is stable. There is new compression deformity of the superior endplate of L2 with 25% loss vertebral body height. This is favored as chronic. L3 compression deformity has mildly progressed compared to the prior examination and also appears chronic. IMPRESSION: 1. No acute localizing process in the abdomen or pelvis. 2. Colonic diverticulosis without evidence for acute diverticulitis. 3. Stable prominence of the pancreatic duct, indeterminate. This can be further evaluated with dedicated pancreatic MRI to exclude focal lesion. 4. New L2 compression deformity and progression of L3 compression deformity compared to 2020. These appear chronic. Correlate for point tenderness. 5.  Aortic Atherosclerosis (ICD10-I70.0). Electronically Signed   By: Ronney Asters M.D.   On: 05/22/2021 19:08    Procedures Procedures   Medications Ordered in ED Medications  morphine 4 MG/ML injection 4 mg (4 mg Intravenous Given 05/22/21 1740)  iohexol (OMNIPAQUE) 350 MG/ML injection 75 mL (75 mLs Intravenous Contrast Given 05/22/21 1759)  dicyclomine (BENTYL) injection 20 mg (20 mg Intramuscular Given 05/22/21 2047)    ED Course  I have reviewed the triage vital signs and the nursing notes.  Pertinent labs & imaging results that were available during my care of the patient were reviewed by me and considered in my medical decision making (see chart for details).  Clinical Course as of  05/22/21 2109  Tue May 22, 2021  1818 Fecal Occult Blood, POC: NEGATIVE [CA]  2041 Leukocytes,Ua(!): TRACE [CA]  2106 Bacteria, UA(!): MANY [CA]    Clinical Course User Index [CA] Suzy Bouchard, PA-C   MDM Rules/Calculators/A&P                           65 year old female presents to the ED due to periumbilical abdominal pain x2 weeks.  She endorses dark stool that started yesterday.  She has been taking chronic ibuprofen for right hand pain and Pepto-Bismol for her abdominal pain.  Upon arrival, stable vitals.  Patient is afebrile, not tachycardic or hypoxic.  Patient in no acute distress.  Abdomen soft, nondistended with periumbilical tenderness without rebound or guarding.  No obvious hernia.  Rectal exam performed with chaperone which was positive for dark stool. Abdominal labs ordered. CT abdomen to rule out emergent etiologies. IV morphine given.   CBC reassuring with no leukocytosis. Normal hemoglobin. CMP with normal renal function.  No major electrolyte derangements.  Lipase normal.  Low suspicion for pancreatitis.  Fecal occult negative.  Suspect dark stool related to Pepto-Bismol.  Low suspicion for GI bleed.  UA significant for proteinuria and trace leukocytes.  Patient denies any dysuria.  Urine culture pending.  Given patient is asymptomatic, will hold off on antibiotics for acute cystitis.  CT abdomen personally reviewed which demonstrates:   IMPRESSION:  1. No acute localizing process in the abdomen or pelvis.  2. Colonic diverticulosis without evidence for acute diverticulitis.  3. Stable prominence of the pancreatic duct, indeterminate. This can  be further evaluated with dedicated pancreatic MRI to exclude focal  lesion.  4. New L2 compression deformity and progression of L3 compression  deformity compared to 2020. These appear chronic. Correlate for  point tenderness.  5.  Aortic Atherosclerosis (ICD10-I70.0).   Patient is aware of prominence of pancreatic duct.   GI number given to patient for further evaluation. Patient also aware of chronic compression deformities. Patient discharged with bentyl for abdominal pain. Low suspicion for cardiac etiology of abdominal pain. Strict ED precautions discussed with patient. Patient states understanding and agrees to plan. Patient discharged home in no acute distress and stable vitals Final Clinical Impression(s) / ED Diagnoses Final diagnoses:  Periumbilical abdominal pain  Pancreatic duct dilated    Rx / DC Orders ED Discharge Orders          Ordered    dicyclomine (BENTYL) 20 MG tablet  2 times daily        05/22/21 2046             Karie Kirks 05/22/21 2112    Isla Pence, MD 05/22/21 2116

## 2021-05-22 NOTE — Telephone Encounter (Signed)
Reason for Disposition  Black or tarry bowel movements (Exception: chronic-unchanged black-grey bowel movements AND is taking iron pills or Pepto-bismol)  Answer Assessment - Initial Assessment Questions 1. COLOR: "What color is it?" "Is that color in part or all of the stool?"     Dark black, all stool 2. ONSET: "When was the unusual color first noted?"     yesterday 3. CAUSE: "Have you eaten any food or taken any medicine of this color?" (See listing in BACKGROUND)     Recent surgery on hand- ibuprofen use recently- pepto use 4. OTHER SYMPTOMS: "Do you have any other symptoms?" (e.g., diarrhea, jaundice, abdominal pain, fever).     Abdominal pain  Protocols used: Stools - Unusual Color-A-AH, Rectal Bleeding-A-AH

## 2021-05-22 NOTE — Discharge Instructions (Signed)
It was a pleasure taking care of you today. As discussed, your CT scan showed a prominent pancreatic duct.  I have included the number of the GI doctor.  Please call tomorrow to schedule an appointment for further evaluation.  I am sending you home with pain medication for your belly pain.  Take as needed.  Please follow-up with PCP within 1 week for further evaluation.  Return to the ER for new or worsening symptoms.

## 2021-05-23 ENCOUNTER — Telehealth: Payer: Self-pay

## 2021-05-23 NOTE — Telephone Encounter (Signed)
Transition Care Management Unsuccessful Follow-up Telephone Call  Date of discharge and from where:  05/22/2021-[facility]  Attempts:  1st Attempt  Reason for unsuccessful TCM follow-up call:  No answer/busy

## 2021-05-23 NOTE — Telephone Encounter (Signed)
FYI

## 2021-05-24 NOTE — Telephone Encounter (Signed)
Transition Care Management Unsuccessful Follow-up Telephone Call  Date of discharge and from where:  05/22/2021-[facility]  Attempts:  2nd Attempt  Reason for unsuccessful TCM follow-up call:  No answer/busy

## 2021-05-25 LAB — URINE CULTURE: Culture: >=100000 — AB

## 2021-05-25 NOTE — Telephone Encounter (Signed)
Transition Care Management Unsuccessful Follow-up Telephone Call  Date of discharge and from where:  05/22/2021-[facility]   Attempts:  3rd Attempt  Reason for unsuccessful TCM follow-up call:  No answer/busy

## 2021-05-26 NOTE — Progress Notes (Signed)
ED Antimicrobial Stewardship Positive Culture Follow Up   Angel Hebert is an 65 y.o. female who presented to [facility] on 05/22/2021 with a chief complaint of  Chief Complaint  Patient presents with   Abdominal Pain    Recent Results (from the past 720 hour(s))  Urine Culture     Status: Abnormal   Collection Time: 05/22/21  8:37 PM   Specimen: Urine, Clean Catch  Result Value Ref Range Status   Specimen Description   Final    URINE, CLEAN CATCH Performed at [facility], [address] [place], [place] [address]    Special Requests   Final    NONE Performed at [facility], [address], [place], [place] [address]    Culture >=100,000 COLONIES/mL ESCHERICHIA COLI (A)  Final   Report Status 05/25/2021 FINAL  Final   Organism ID, Bacteria ESCHERICHIA COLI (A)  Final      Susceptibility   Escherichia coli - MIC*    AMPICILLIN <=2 SENSITIVE Sensitive     CEFAZOLIN <=4 SENSITIVE Sensitive     CEFEPIME <=0.12 SENSITIVE Sensitive     CEFTRIAXONE <=0.25 SENSITIVE Sensitive     CIPROFLOXACIN <=0.25 SENSITIVE Sensitive     GENTAMICIN <=1 SENSITIVE Sensitive     IMIPENEM <=0.25 SENSITIVE Sensitive     NITROFURANTOIN <=16 SENSITIVE Sensitive     TRIMETH/SULFA <=20 SENSITIVE Sensitive     AMPICILLIN/SULBACTAM <=2 SENSITIVE Sensitive     PIP/TAZO <=4 SENSITIVE Sensitive     * >=100,000 COLONIES/mL ESCHERICHIA COLI    [x]  Patient discharged originally without antimicrobial agent and treatment is now indicated  New antibiotic prescription: Cephalexin 500 mg q12h x 5 days (Qty 10; Refills 0)  ED Provider: Sabra Heck, PharmD, BCPS 05/26/2021 12:00 PM ED Clinical Pharmacist -  [phone]

## 2021-05-27 ENCOUNTER — Telehealth: Payer: Self-pay | Admitting: Emergency Medicine

## 2021-05-27 NOTE — Telephone Encounter (Signed)
Post ED Visit - Positive Culture Follow-up: Unsuccessful Patient Follow-up  Culture assessed and recommendations reviewed by:  []  Elenor Quinones, Pharm.D. []  Heide Guile, Pharm.D., BCPS AQ-ID []  Parks Neptune, Pharm.D., BCPS []  Alycia Rossetti, Pharm.D., BCPS []  C[place], Pharm.D., BCPS, AAHIVP []  Legrand Como, Pharm.D., BCPS, AAHIVP [x]  Lorelei Pont, PharmD []  Vincenza Hews, PharmD, BCPS  Positive urine culture  [x]  Patient discharged without antimicrobial prescription and treatment is now indicated []  Organism is resistant to prescribed ED discharge antimicrobial []  Patient with positive blood cultures   Unable to contact patient at phone number on file, letter will be sent to address on file  Plan: Cefuroxime 500 mg BID for five days - Lavenia Atlas MD  Milus Mallick 05/27/2021, 6:31 PM

## 2021-06-11 ENCOUNTER — Ambulatory Visit: Payer: Medicaid Other | Attending: Family Medicine | Admitting: Family Medicine

## 2021-06-11 ENCOUNTER — Encounter: Payer: Self-pay | Admitting: Family Medicine

## 2021-06-11 ENCOUNTER — Other Ambulatory Visit: Payer: Self-pay

## 2021-06-11 VITALS — BP 179/108 | HR 82 | Ht 67.0 in | Wt 129.4 lb

## 2021-06-11 DIAGNOSIS — R1084 Generalized abdominal pain: Secondary | ICD-10-CM | POA: Insufficient documentation

## 2021-06-11 DIAGNOSIS — E785 Hyperlipidemia, unspecified: Secondary | ICD-10-CM | POA: Insufficient documentation

## 2021-06-11 DIAGNOSIS — Z9114 Patient's other noncompliance with medication regimen: Secondary | ICD-10-CM | POA: Diagnosis not present

## 2021-06-11 DIAGNOSIS — Z7182 Exercise counseling: Secondary | ICD-10-CM | POA: Insufficient documentation

## 2021-06-11 DIAGNOSIS — M170 Bilateral primary osteoarthritis of knee: Secondary | ICD-10-CM | POA: Diagnosis not present

## 2021-06-11 DIAGNOSIS — E7800 Pure hypercholesterolemia, unspecified: Secondary | ICD-10-CM | POA: Insufficient documentation

## 2021-06-11 DIAGNOSIS — I10 Essential (primary) hypertension: Secondary | ICD-10-CM | POA: Diagnosis not present

## 2021-06-11 DIAGNOSIS — K219 Gastro-esophageal reflux disease without esophagitis: Secondary | ICD-10-CM | POA: Diagnosis not present

## 2021-06-11 DIAGNOSIS — Z96652 Presence of left artificial knee joint: Secondary | ICD-10-CM | POA: Insufficient documentation

## 2021-06-11 DIAGNOSIS — I700 Atherosclerosis of aorta: Secondary | ICD-10-CM | POA: Diagnosis not present

## 2021-06-11 DIAGNOSIS — K5900 Constipation, unspecified: Secondary | ICD-10-CM | POA: Diagnosis not present

## 2021-06-11 DIAGNOSIS — I5042 Chronic combined systolic (congestive) and diastolic (congestive) heart failure: Secondary | ICD-10-CM | POA: Insufficient documentation

## 2021-06-11 DIAGNOSIS — K5730 Diverticulosis of large intestine without perforation or abscess without bleeding: Secondary | ICD-10-CM | POA: Insufficient documentation

## 2021-06-11 DIAGNOSIS — Z713 Dietary counseling and surveillance: Secondary | ICD-10-CM | POA: Diagnosis not present

## 2021-06-11 DIAGNOSIS — Z79899 Other long term (current) drug therapy: Secondary | ICD-10-CM | POA: Insufficient documentation

## 2021-06-11 DIAGNOSIS — Z7951 Long term (current) use of inhaled steroids: Secondary | ICD-10-CM | POA: Insufficient documentation

## 2021-06-11 DIAGNOSIS — K921 Melena: Secondary | ICD-10-CM | POA: Insufficient documentation

## 2021-06-11 DIAGNOSIS — Z7901 Long term (current) use of anticoagulants: Secondary | ICD-10-CM | POA: Diagnosis not present

## 2021-06-11 DIAGNOSIS — J4520 Mild intermittent asthma, uncomplicated: Secondary | ICD-10-CM | POA: Diagnosis not present

## 2021-06-11 DIAGNOSIS — I110 Hypertensive heart disease with heart failure: Secondary | ICD-10-CM | POA: Diagnosis not present

## 2021-06-11 MED ORDER — PROAIR HFA 108 (90 BASE) MCG/ACT IN AERS: 2.0000 | INHALATION_SPRAY | Freq: Four times a day (QID) | RESPIRATORY_TRACT | 5 refills | Status: DC | PRN

## 2021-06-11 MED ORDER — LOSARTAN POTASSIUM-HCTZ 100-25 MG PO TABS: 1.0000 | ORAL_TABLET | Freq: Every day | ORAL | 6 refills | Status: DC

## 2021-06-11 MED ORDER — FLUTICASONE-SALMETEROL 500-50 MCG/ACT IN AEPB: 1.0000 | INHALATION_SPRAY | Freq: Two times a day (BID) | RESPIRATORY_TRACT | 6 refills | Status: DC

## 2021-06-11 MED ORDER — METOPROLOL SUCCINATE ER 25 MG PO TB24: 25.0000 mg | ORAL_TABLET | Freq: Every day | ORAL | 6 refills | Status: DC

## 2021-06-11 MED ORDER — ATORVASTATIN CALCIUM 10 MG PO TABS: 10.0000 mg | ORAL_TABLET | Freq: Every day | ORAL | 6 refills | Status: DC

## 2021-06-11 MED ORDER — PANTOPRAZOLE SODIUM 20 MG PO TBEC: 20.0000 mg | DELAYED_RELEASE_TABLET | Freq: Every day | ORAL | 1 refills | Status: DC

## 2021-06-11 MED ORDER — MONTELUKAST SODIUM 10 MG PO TABS: 10.0000 mg | ORAL_TABLET | Freq: Every day | ORAL | 6 refills | Status: DC

## 2021-06-11 MED ORDER — POLYETHYLENE GLYCOL 3350 17 GM/SCOOP PO POWD
17.0000 g | Freq: Every day | ORAL | 1 refills | Status: DC
Start: 2021-06-11 — End: 2022-10-21

## 2021-06-11 NOTE — Patient Instructions (Signed)
Rectal Bleeding Rectal bleeding is when blood comes out of the opening of the butt (anus). People with this kind of bleeding may notice bright red blood in their underwear or in the toilet after they poop (have a bowel movement). They may also have blood mixed with their poop (stool), or dark red or black poop. Rectal bleeding is often a sign that something is wrong. This condition can be caused by many things. It needs to be checked by a doctor. Your doctor will do tests to know what is causing your condition. Follow these instructions at home: Watch for any changes in your condition. Take these actions to help with bleeding and discomfort: Medicines Take over-the-counter and prescription medicines only as told by your doctor. Ask your doctor about changing or stopping your normal medicines. This is important if you are taking blood thinners. Medicines that thin the blood can make rectal bleeding worse. Managing constipation Your condition may cause trouble pooping (constipation). To prevent or treat trouble pooping, or to help make your poop soft, you may need to: Drink enough fluid to keep your pee (urine) pale yellow. Take over-the-counter or prescription medicines. Eat foods that are high in fiber. These include beans, whole grains, and fresh fruits and vegetables. Limit foods that are high in fat and sugar. These include fried or sweet foods.  General instructions Try not to strain when you poop. Try taking a warm bath. This may help with pain. Keep all follow-up visits as told by your doctor. This is important. Contact a doctor if: You have pain or swelling in your belly (abdomen). You have a fever. You feel weak. You feel like you may vomit. You cannot poop. Get help right away if: You have new bleeding. You have more bleeding than before. You have black or dark red poop. You vomit blood or something that looks like coffee grounds. You pass out (faint). You have very bad pain  in your butt. Summary Rectal bleeding is when blood comes out of the opening of the butt. This bleeding is often a sign that something is wrong. Eat a diet that is high in fiber. This will help to keep your poop soft. Talk to your doctor if you take medicines that thin the blood. These medicines can make bleeding worse. Get help right away if you have new or more bleeding, black or dark red poop, or blood in your vomit. Also, get help if you pass out or have very bad pain in your butt. This information is not intended to replace advice given to you by your health care provider. Make sure you discuss any questions you have with your health care provider. Document Revised: 07/21/2019 Document Reviewed: 07/21/2019 Elsevier Patient Education  2022 [employer].

## 2021-06-11 NOTE — Progress Notes (Signed)
Subjective:  Patient ID: Angel Hebert, female    DOB: 1956-07-26  Age: 65 y.o. MRN: [id number]  CC: Hospitalization Follow-up   HPI Syniyah Hebert is a 65 y.o. year old female with a history of hypertension, hyperlipidemia, asthma, allergic rhinitis, nasal polyps (Status post sinus surgery in 10/2016), GERD, bilateral knee osteoarthritis (status post left total knee arthroplasty in 09/2016), CHF (EF 45 to 50%) here for follow-up visit.  Interval History: She complains of periumbilical pain x 1.5 month.  Seen at the ED on 05/22/2021 for this and placed on Bentyl.  ED notes reveal she had been on Pepto-Bismol.  She did Peptobismol prior to her ED visit but had stopped after that visit but her stool remains black. She has been taking Ibuprofen q 8 hrs for pain. Last BM was 2 days ago and she denies having to strain. Pain is not associated with nausea but prevents her from eating and is described as severe.  Pain is relieved after she has moved her bowels. Oxycodone appears on her med list but she stopped it 2-3 weeks ago Denies presence of dyspepsia. Pain is 10/10 CT Abdomen:   IMPRESSION: 1. No acute localizing process in the abdomen or pelvis. 2. Colonic diverticulosis without evidence for acute diverticulitis. 3. Stable prominence of the pancreatic duct, indeterminate. This can be further evaluated with dedicated pancreatic MRI to exclude focal lesion. 4. New L2 compression deformity and progression of L3 compression deformity compared to 2020. These appear chronic. Correlate for point tenderness. 5.  Aortic Atherosclerosis (ICD10-I70.0).  BP is elevated and she has been out of her medications. Past Medical History:  Diagnosis Date   Arthritis    Asthma    last year in December   Dyspnea    GERD (gastroesophageal reflux disease)    High cholesterol    Hypertension    Pneumonia 01/2017    Past Surgical History:  Procedure Laterality Date   ABDOMINAL HYSTERECTOMY      FEMUR IM NAIL Left 04/17/2020   Procedure: INTRAMEDULLARY (IM) RETROGRADE FEMORAL NAILING;  Surgeon: Meredith Pel, MD;  Location: [place];  Service: Orthopedics;  Laterality: Left;   nasal polyps     removed just this past tuesday at [place] [facility] facility over by [place] Left 10/01/2016   Procedure: LEFT TOTAL KNEE ARTHROPLASTY;  Surgeon: Meredith Pel, MD;  Location: [place] [facility];  Service: Orthopedics;  Laterality: Left;    Family History  Problem Relation Age of Onset   Diabetes Sister    Healthy Mother    Healthy Father    Colon cancer Neg Hx    Breast cancer Neg Hx     No Known Allergies  Outpatient Medications Prior to Visit  Medication Sig Dispense Refill   albuterol (PROVENTIL) (2.5 MG/3ML) 0.083% nebulizer solution Take 3 mLs (2.5 mg total) by nebulization every 6 (six) hours as needed for wheezing or shortness of breath. 75 mL 3   dicyclomine (BENTYL) 20 MG tablet Take 1 tablet (20 mg total) by mouth 2 (two) times daily. 20 tablet 0   enoxaparin (LOVENOX) 30 MG/0.3ML injection Inject 0.3 mLs (30 mg total) into the skin daily. 4 mL 0   atorvastatin (LIPITOR) 10 MG tablet Take 1 tablet (10 mg total) by mouth daily at 6 PM. 30 tablet 6   Fluticasone-Salmeterol (ADVAIR DISKUS) 500-50 MCG/DOSE AEPB INHALE 1 PUFF INTO THE LUNGS EVERY 12 (TWELVE) HOURS. 60 each 6   losartan-hydrochlorothiazide (HYZAAR) 100-25 MG tablet Take  1 tablet by mouth daily. 30 tablet 6   metoprolol succinate (TOPROL XL) 25 MG 24 hr tablet Take 1 tablet (25 mg total) by mouth daily. 30 tablet 6   PROAIR HFA 108 (90 Base) MCG/ACT inhaler Inhale 2 puffs into the lungs every 6 (six) hours as needed for wheezing or shortness of breath. 8.5 g 5   ferrous sulfate 325 (65 FE) MG EC tablet Take 1 tablet (325 mg total) by mouth 2 (two) times daily. (Patient not taking: No sig reported) 60 tablet 3   fluticasone (FLONASE) 50 MCG/ACT nasal spray Place 2 sprays into both nostrils daily.  (Patient not taking: No sig reported) 16 g 6   furosemide (LASIX) 40 MG tablet Take 1 tablet (40 mg total) by mouth daily. 30 tablet 3   oxyCODONE (OXY IR/ROXICODONE) 5 MG immediate release tablet Take 1 tablet (5 mg total) by mouth daily as needed for moderate pain (pain score 4-6). (Patient not taking: Reported on 06/11/2021) 30 tablet 0   oxyCODONE-acetaminophen (PERCOCET/ROXICET) 5-325 MG tablet Take 1 tablet by mouth every 12 (twelve) hours as needed for severe pain. (Patient not taking: Reported on 06/11/2021) 30 tablet 0   potassium chloride SA (KLOR-CON) 20 MEQ tablet Take 1 tablet (20 mEq total) by mouth daily. (Patient not taking: No sig reported) 30 tablet 6   Fluticasone-Salmeterol (ADVAIR) 500-50 MCG/DOSE AEPB INHALE 1 PUFF INTO THE LUNGS EVERY 12 (TWELVE) HOURS. 60 each 6   montelukast (SINGULAIR) 10 MG tablet Take 1 tablet (10 mg total) by mouth at bedtime. (Patient not taking: No sig reported) 30 tablet 6   pantoprazole (PROTONIX) 20 MG tablet Take 1 tablet (20 mg total) by mouth daily. (Patient not taking: No sig reported) 30 tablet 5   PROAIR HFA 108 (90 Base) MCG/ACT inhaler INHALE 2 PUFFS INTO THE LUNGS EVERY 6 (SIX) HOURS AS NEEDED FOR WHEEZING OR SHORTNESS OF BREATH. 8.5 g 5   No facility-administered medications prior to visit.     ROS Review of Systems  Constitutional:  Negative for activity change, appetite change and fatigue.  HENT:  Negative for congestion, sinus pressure and sore throat.   Eyes:  Negative for visual disturbance.  Respiratory:  Negative for cough, chest tightness, shortness of breath and wheezing.   Cardiovascular:  Negative for chest pain and palpitations.  Gastrointestinal:  Positive for abdominal pain. Negative for abdominal distention and constipation.  Endocrine: Negative for polydipsia.  Genitourinary:  Negative for dysuria and frequency.  Musculoskeletal:  Negative for arthralgias and back pain.  Skin:  Negative for rash.  Neurological:   Negative for tremors, light-headedness and numbness.  Hematological:  Does not bruise/bleed easily.  Psychiatric/Behavioral:  Negative for agitation and behavioral problems.    Objective:  BP (!) 179/108   Pulse 82   Ht 5\' 7"  (1.702 m)   Wt 129 lb 6.4 oz (58.7 kg)   SpO2 100%   BMI 20.27 kg/m   BP/Weight 06/11/2021 05/22/2021 9/56/3875  Systolic BP 643 329 518  Diastolic BP 841 95 660  Wt. (Lbs) 129.4 145 -  BMI 20.27 22.71 -      Physical Exam Constitutional:      Appearance: She is well-developed.  Cardiovascular:     Rate and Rhythm: Normal rate.     Heart sounds: Normal heart sounds. No murmur heard. Pulmonary:     Effort: Pulmonary effort is normal.     Breath sounds: Normal breath sounds. No wheezing or rales.  Chest:  Chest wall: No tenderness.  Abdominal:     General: Bowel sounds are normal. There is no distension.     Palpations: Abdomen is soft. There is no mass.     Tenderness: There is abdominal tenderness (generalized).  Musculoskeletal:        General: Normal range of motion.     Right lower leg: No edema.     Left lower leg: No edema.  Neurological:     Mental Status: She is alert and oriented to person, place, and time.  Psychiatric:        Mood and Affect: Mood normal.    CMP Latest Ref Rng & Units 05/22/2021 04/19/2020 04/18/2020  Glucose 70 - 99 mg/dL 102(H) 88 183(H)  BUN 8 - 23 mg/dL 9 6(L) 9  Creatinine 0.44 - 1.00 mg/dL 0.55 0.50 0.79  Sodium 135 - 145 mmol/L 137 134(L) 135  Potassium 3.5 - 5.1 mmol/L 3.6 2.9(L) 3.4(L)  Chloride 98 - 111 mmol/L 99 99 102  CO2 22 - 32 mmol/L 28 26 20(L)  Calcium 8.9 - 10.3 mg/dL 9.8 8.5(L) 8.7(L)  Total Protein 6.5 - 8.1 g/dL 7.8 - -  Total Bilirubin 0.3 - 1.2 mg/dL 0.6 - -  Alkaline Phos 38 - 126 U/L 106 - -  AST 15 - 41 U/L 17 - -  ALT 0 - 44 U/L 5 - -    Lipid Panel     Component Value Date/Time   CHOL 185 10/22/2018 0920   TRIG 129 10/22/2018 0920   HDL 81 10/22/2018 0920   CHOLHDL 2.3  10/22/2018 0920   CHOLHDL 2.5 05/20/2016 1452   VLDL 26 05/20/2016 1452   LDLCALC 78 10/22/2018 0920    CBC    Component Value Date/Time   WBC 5.5 05/22/2021 1405   RBC 4.58 05/22/2021 1405   HGB 14.7 05/22/2021 1405   HGB 11.1 08/31/2019 1043   HCT 43.5 05/22/2021 1405   HCT 35.9 08/31/2019 1043   PLT 382 05/22/2021 1405   PLT 414 08/31/2019 1043   MCV 95.0 05/22/2021 1405   MCV 82 08/31/2019 1043   MCH 32.1 05/22/2021 1405   MCHC 33.8 05/22/2021 1405   RDW 15.6 (H) 05/22/2021 1405   RDW 22.2 (H) 08/31/2019 1043   LYMPHSABS 0.8 04/16/2020 1712   LYMPHSABS 1.5 08/31/2019 1043   MONOABS 0.5 04/16/2020 1712   EOSABS 0.0 04/16/2020 1712   EOSABS 0.2 08/31/2019 1043   BASOSABS 0.0 04/16/2020 1712   BASOSABS 0.1 08/31/2019 1043    Lab Results  Component Value Date   HGBA1C 5.3 10/26/2015    Assessment & Plan:  1. Melena Last CB C revealed normal hemoglobin of 14.7, three weeks ago We will check CBC again She is due for colonoscopy so we will go ahead and place referral to GI - Ambulatory referral to Gastroenterology - CBC with Differential/Platelet  2. Gastroesophageal reflux disease without esophagitis Previous history of GERD Refilled PPI - pantoprazole (PROTONIX) 20 MG tablet; Take 1 tablet (20 mg total) by mouth daily.  Dispense: 30 tablet; Refill: 1  3. Mild intermittent asthma without complication Stable - montelukast (SINGULAIR) 10 MG tablet; Take 1 tablet (10 mg total) by mouth at bedtime.  Dispense: 30 tablet; Refill: 6 - fluticasone-salmeterol (ADVAIR) 500-50 MCG/ACT AEPB; Inhale 1 puff into the lungs in the morning and at bedtime.  Dispense: 1 each; Refill: 6  4. Hypertensive heart disease with chronic combined systolic and diastolic congestive heart failure (HCC) Euvolemic, EF of 45  to 50% from 07/2019 Continue guideline directed medical therapy - metoprolol succinate (TOPROL XL) 25 MG 24 hr tablet; Take 1 tablet (25 mg total) by mouth daily.   Dispense: 30 tablet; Refill: 6  5. Essential hypertension Uncontrolled due to not taking antihypertensive which I have refilled Counseled on blood pressure goal of less than 130/80, low-sodium, DASH diet, medication compliance, 150 minutes of moderate intensity exercise per week. Discussed medication compliance, adverse effects. - losartan-hydrochlorothiazide (HYZAAR) 100-25 MG tablet; Take 1 tablet by mouth daily.  Dispense: 30 tablet; Refill: 6  6. Pure hypercholesterolemia Controlled Low-cholesterol diet - Lipid panel - atorvastatin (LIPITOR) 10 MG tablet; Take 1 tablet (10 mg total) by mouth daily at 6 PM.  Dispense: 30 tablet; Refill: 6  7. Generalized abdominal pain Unclear etiology Bentyl was ineffective Will treat constipation with a laxative CT abdomen unrevealing She has been referred to GI - polyethylene glycol powder (GLYCOLAX/MIRALAX) 17 GM/SCOOP powder; Take 17 g by mouth daily.  Dispense: 3350 g; Refill: 1 - H. pylori breath test   Meds ordered this encounter  Medications   polyethylene glycol powder (GLYCOLAX/MIRALAX) 17 GM/SCOOP powder    Sig: Take 17 g by mouth daily.    Dispense:  3350 g    Refill:  1   PROAIR HFA 108 (90 Base) MCG/ACT inhaler    Sig: INHALE 2 PUFFS INTO THE LUNGS EVERY 6 (SIX) HOURS AS NEEDED FOR WHEEZING OR SHORTNESS OF BREATH.    Dispense:  8.5 g    Refill:  5   pantoprazole (PROTONIX) 20 MG tablet    Sig: Take 1 tablet (20 mg total) by mouth daily.    Dispense:  30 tablet    Refill:  1   montelukast (SINGULAIR) 10 MG tablet    Sig: Take 1 tablet (10 mg total) by mouth at bedtime.    Dispense:  30 tablet    Refill:  6   metoprolol succinate (TOPROL XL) 25 MG 24 hr tablet    Sig: Take 1 tablet (25 mg total) by mouth daily.    Dispense:  30 tablet    Refill:  6   losartan-hydrochlorothiazide (HYZAAR) 100-25 MG tablet    Sig: Take 1 tablet by mouth daily.    Dispense:  30 tablet    Refill:  6   atorvastatin (LIPITOR) 10 MG tablet     Sig: Take 1 tablet (10 mg total) by mouth daily at 6 PM.    Dispense:  30 tablet    Refill:  6   fluticasone-salmeterol (ADVAIR) 500-50 MCG/ACT AEPB    Sig: Inhale 1 puff into the lungs in the morning and at bedtime.    Dispense:  1 each    Refill:  6    Follow-up: Return in about 1 month (around 07/12/2021) for Hypertension and abdominal pain.       Charlott Rakes, MD, FAAFP. [facility], [place]   06/11/2021, 1:42 PM

## 2021-06-11 NOTE — Progress Notes (Signed)
Still having abdominal pain. Black stool.

## 2021-06-12 LAB — CBC WITH DIFFERENTIAL/PLATELET
Basophils Absolute: 0 10*3/uL (ref 0.0–0.2)
Basos: 1 %
EOS (ABSOLUTE): 0.3 10*3/uL (ref 0.0–0.4)
Eos: 6 %
Hematocrit: 39.4 % (ref 34.0–46.6)
Hemoglobin: 13.3 g/dL (ref 11.1–15.9)
Immature Grans (Abs): 0 10*3/uL (ref 0.0–0.1)
Immature Granulocytes: 0 %
Lymphocytes Absolute: 1.4 10*3/uL (ref 0.7–3.1)
Lymphs: 26 %
MCH: 32.1 pg (ref 26.6–33.0)
MCHC: 33.8 g/dL (ref 31.5–35.7)
MCV: 95 fL (ref 79–97)
Monocytes Absolute: 0.5 10*3/uL (ref 0.1–0.9)
Monocytes: 9 %
Neutrophils Absolute: 3.1 10*3/uL (ref 1.4–7.0)
Neutrophils: 58 %
Platelets: 413 10*3/uL (ref 150–450)
RBC: 4.14 x10E6/uL (ref 3.77–5.28)
RDW: 14.5 % (ref 11.7–15.4)
WBC: 5.3 10*3/uL (ref 3.4–10.8)

## 2021-06-12 LAB — LIPID PANEL
Chol/HDL Ratio: 2.1 ratio (ref 0.0–4.4)
Cholesterol, Total: 226 mg/dL — ABNORMAL HIGH (ref 100–199)
HDL: 108 mg/dL (ref >39)
LDL Chol Calc (NIH): 103 mg/dL — ABNORMAL HIGH (ref 0–99)
Triglycerides: 87 mg/dL (ref 0–149)
VLDL Cholesterol Cal: 15 mg/dL (ref 5–40)

## 2021-06-12 LAB — H. PYLORI BREATH TEST: H pylori Breath Test: POSITIVE — AB

## 2021-06-13 ENCOUNTER — Other Ambulatory Visit: Payer: Self-pay | Admitting: Family Medicine

## 2021-06-13 DIAGNOSIS — K2901 Acute gastritis with bleeding: Secondary | ICD-10-CM

## 2021-06-13 DIAGNOSIS — E7800 Pure hypercholesterolemia, unspecified: Secondary | ICD-10-CM

## 2021-06-13 MED ORDER — AMOXICILLIN 500 MG PO CAPS: 1000.0000 mg | ORAL_CAPSULE | Freq: Two times a day (BID) | ORAL | 0 refills | Status: DC

## 2021-06-13 MED ORDER — CLARITHROMYCIN 500 MG PO TABS: 500.0000 mg | ORAL_TABLET | Freq: Two times a day (BID) | ORAL | 0 refills | Status: DC

## 2021-06-13 MED ORDER — ATORVASTATIN CALCIUM 20 MG PO TABS: 20.0000 mg | ORAL_TABLET | Freq: Every day | ORAL | 6 refills | Status: DC

## 2021-06-15 ENCOUNTER — Telehealth: Payer: Self-pay

## 2021-06-15 NOTE — Telephone Encounter (Signed)
-----   Message from Charlott Rakes, MD sent at 06/13/2021  8:37 AM EDT ----- Please inform her that she tested positive for H. pylori which could explain her GI symptoms.  I have sent a prescription for 2 antibiotics which she will take in addition to Protonix for 2 weeks.  Repeat breath test recommended in 1 month.  Cholesterol is elevated and I have increased her dose of atorvastatin.  Please advised to comply with a low-cholesterol diet.

## 2021-06-15 NOTE — Telephone Encounter (Signed)
Patient name and DOB has been verified Patient was informed of lab results. Patient had no questions.   Pt has been scheduled for repeat breath test.

## 2021-07-16 ENCOUNTER — Ambulatory Visit: Payer: Medicaid Other | Attending: Family Medicine

## 2021-07-16 ENCOUNTER — Other Ambulatory Visit: Payer: Self-pay

## 2021-07-16 DIAGNOSIS — K2901 Acute gastritis with bleeding: Secondary | ICD-10-CM

## 2021-07-18 ENCOUNTER — Telehealth: Payer: Self-pay

## 2021-07-18 LAB — H. PYLORI BREATH TEST: H pylori Breath Test: NEGATIVE

## 2021-07-18 NOTE — Telephone Encounter (Signed)
Pt was called and message states that call can not be completed at this time.  CRM created.

## 2021-07-18 NOTE — Telephone Encounter (Signed)
-----   Message from Charlott Rakes, MD sent at 07/18/2021 12:29 PM EST ----- Please inform the patient that labs are normal. Thank you.

## 2021-07-24 ENCOUNTER — Ambulatory Visit: Payer: Self-pay | Admitting: *Deleted

## 2021-07-24 NOTE — Telephone Encounter (Signed)
Reason for Disposition . Shock suspected (e.g., cold/pale/clammy skin, too weak to stand, low BP, rapid pulse)    Pt cold and ashen looking per friend that called in, Mr. Angel Hebert, losing weight, and not eating.  Answer Assessment - Initial Assessment Questions 1. COLOR: "What color is it?" "Is that color in part or all of the stool?"     Husband calling in.  Pt with him.    She was having black stools and was seen for it and given medication.  She has a f/u appt 12/30.   She drank a lot of beer every day in the past.   She is still drinking beer.   Her stomach is still hurting for 3 months and she's not eating.   Dr.Newlin gave her some stomach medicine.   I don't know why she won't eat nothing.   She complains that her stomach hurts. Day before yesterday she didn't her breakfast until after 2:00.  She ate a little [place] and a banana.    I'm giving her stuff but she's not eating.   She's not eating nothing. She lost some weight.   Her stomach hurts all the time.   I think she needs to see the dr.    She is no longer having black stools.   They are brown. 2. ONSET: "When was the unusual color first noted?"     No longer having black stools. She doesn't look good.   She looks gray looking.  She feels cold.   2 days ago was the last time she ate something.   I gave her a bath.    I was washing her back and she is so skinny.     3. CAUSE: "Have you eaten any food or taken any medicine of this color?" (See listing in BACKGROUND)     *No Answer* 4. OTHER SYMPTOMS: "Do you have any other symptoms?" (e.g., diarrhea, jaundice, abdominal pain, fever).     *No Answer*  Answer Assessment - Initial Assessment Questions 1. LOCATION: "Where does it hurt?"      Mr. Angel Hebert calling in for pt but pt there with him. She is c/o her stomach hurting all the time.   She did have black stools a while back and Dr. [name] treated her for that.   No longer having black stools but continues to c/o stomach pain every  day and she's not eating.  Mr. Angel Hebert is fixing her stuff to eat but she's not eating much at all.  He mentioned she has lost weight and looks skinny.   She also looks ashen colored and is cold. 2. RADIATION: "Does the pain shoot anywhere else?" (e.g., chest, back)     C/o continued stomach pain 3. ONSET: "When did the pain begin?" (e.g., minutes, hours or days ago)      When she was having black stools and saw Dr. [name] for it.   The pain has continued.   Mr. Angel Hebert is concerned because she is not eating. 4. SUDDEN: "Gradual or sudden onset?"     Not asked since on going 5. PATTERN "Does the pain come and go, or is it constant?"    - If constant: "Is it getting better, staying the same, or worsening?"      (Note: Constant means the pain never goes away completely; most serious pain is constant and it progresses)     - If intermittent: "How long does it last?" "Do you have pain now?"     (  Note: Intermittent means the pain goes away completely between bouts)     Constant 6. SEVERITY: "How bad is the pain?"  (e.g., Scale 1-10; mild, moderate, or severe)   - MILD (1-3): doesn't interfere with normal activities, abdomen soft and not tender to touch    - MODERATE (4-7): interferes with normal activities or awakens from sleep, abdomen tender to touch    - SEVERE (8-10): excruciating pain, doubled over, unable to do any normal activities      "She complains about her stomach hurting all the time".   7. RECURRENT SYMPTOM: "Have you ever had this type of stomach pain before?" If Yes, ask: "When was the last time?" and "What happened that time?"      She was treated for black stools by Dr. [name] and had stomach pain then that has continued to the point she's not eating and not looking good to me.    "I think she needs to see the dr". 8. CAUSE: "What do you think is causing the stomach pain?"     "I don't know" 9. RELIEVING/AGGRAVATING FACTORS: "What makes it better or worse?" (e.g., movement, antacids,  bowel movement)     Not asked 10. OTHER SYMPTOMS: "Do you have any other symptoms?" (e.g., back pain, diarrhea, fever, urination pain, vomiting)       No longer having black stools.   "It's now brown colored like normal".  11. PREGNANCY: "Is there any chance you are pregnant?" "When was your last menstrual period?"       NA due to age  Protocols used: Stools - Unusual Color-A-AH, Abdominal Pain - [facility]

## 2021-07-24 NOTE — Telephone Encounter (Signed)
Mr. Angel Hebert called in to make an appt with Dr. Margarita Rana for pt.   She is c/o stomach pain all the time and not eating.   She was treated for black stools by Dr. Margarita Rana which she is no longer having.  During the triage Mr. Angel Hebert mentioned she has not been eating for several days, looks ashen colored and is c/o being very cold.   He said she has lost a lot of weight recently.   At this point I instructed him to call 911 and have her transported to the hospital that she doesn't sound like she is doing very well.    He was agreeable to this plan.   I asked if he could call 911 and he said he would.  I sent this information to Dr. Margarita Rana at [facility].

## 2021-07-25 ENCOUNTER — Encounter (HOSPITAL_COMMUNITY): Payer: Self-pay

## 2021-07-25 ENCOUNTER — Observation Stay (HOSPITAL_COMMUNITY): Payer: Medicaid Other

## 2021-07-25 ENCOUNTER — Other Ambulatory Visit: Payer: Self-pay

## 2021-07-25 ENCOUNTER — Observation Stay (HOSPITAL_COMMUNITY)
Admission: EM | Admit: 2021-07-25 | Discharge: 2021-07-26 | Disposition: A | Discharge status: Home / Self Care | Payer: Medicaid Other | Attending: Internal Medicine | Admitting: Internal Medicine

## 2021-07-25 ENCOUNTER — Telehealth: Payer: Self-pay | Admitting: Family Medicine

## 2021-07-25 DIAGNOSIS — K852 Alcohol induced acute pancreatitis without necrosis or infection: Secondary | ICD-10-CM | POA: Diagnosis present

## 2021-07-25 DIAGNOSIS — K2970 Gastritis, unspecified, without bleeding: Principal | ICD-10-CM | POA: Diagnosis present

## 2021-07-25 DIAGNOSIS — E871 Hypo-osmolality and hyponatremia: Secondary | ICD-10-CM | POA: Diagnosis not present

## 2021-07-25 DIAGNOSIS — N390 Urinary tract infection, site not specified: Secondary | ICD-10-CM | POA: Diagnosis not present

## 2021-07-25 DIAGNOSIS — I10 Essential (primary) hypertension: Secondary | ICD-10-CM | POA: Insufficient documentation

## 2021-07-25 DIAGNOSIS — E876 Hypokalemia: Secondary | ICD-10-CM | POA: Insufficient documentation

## 2021-07-25 DIAGNOSIS — J45909 Unspecified asthma, uncomplicated: Secondary | ICD-10-CM | POA: Insufficient documentation

## 2021-07-25 DIAGNOSIS — K819 Cholecystitis, unspecified: Secondary | ICD-10-CM

## 2021-07-25 DIAGNOSIS — Z79899 Other long term (current) drug therapy: Secondary | ICD-10-CM | POA: Diagnosis not present

## 2021-07-25 DIAGNOSIS — K860 Alcohol-induced chronic pancreatitis: Secondary | ICD-10-CM | POA: Diagnosis not present

## 2021-07-25 DIAGNOSIS — N179 Acute kidney failure, unspecified: Secondary | ICD-10-CM | POA: Diagnosis not present

## 2021-07-25 DIAGNOSIS — R1013 Epigastric pain: Secondary | ICD-10-CM | POA: Diagnosis present

## 2021-07-25 DIAGNOSIS — Z96652 Presence of left artificial knee joint: Secondary | ICD-10-CM | POA: Diagnosis not present

## 2021-07-25 DIAGNOSIS — R109 Unspecified abdominal pain: Secondary | ICD-10-CM | POA: Diagnosis not present

## 2021-07-25 DIAGNOSIS — Z20822 Contact with and (suspected) exposure to covid-19: Secondary | ICD-10-CM | POA: Diagnosis not present

## 2021-07-25 DIAGNOSIS — K810 Acute cholecystitis: Secondary | ICD-10-CM | POA: Diagnosis not present

## 2021-07-25 DIAGNOSIS — K219 Gastro-esophageal reflux disease without esophagitis: Secondary | ICD-10-CM

## 2021-07-25 LAB — CBC WITH DIFFERENTIAL/PLATELET
Abs Immature Granulocytes: 0.02 10*3/uL (ref 0.00–0.07)
Basophils Absolute: 0 10*3/uL (ref 0.0–0.1)
Basophils Relative: 1 %
Eosinophils Absolute: 0.1 10*3/uL (ref 0.0–0.5)
Eosinophils Relative: 1 %
HCT: 30.1 % — ABNORMAL LOW (ref 36.0–46.0)
Hemoglobin: 10.6 g/dL — ABNORMAL LOW (ref 12.0–15.0)
Immature Granulocytes: 0 %
Lymphocytes Relative: 43 %
Lymphs Abs: 2.4 10*3/uL (ref 0.7–4.0)
MCH: 31.9 pg (ref 26.0–34.0)
MCHC: 35.2 g/dL (ref 30.0–36.0)
MCV: 90.7 fL (ref 80.0–100.0)
Monocytes Absolute: 0.5 10*3/uL (ref 0.1–1.0)
Monocytes Relative: 9 %
Neutro Abs: 2.6 10*3/uL (ref 1.7–7.7)
Neutrophils Relative %: 46 %
Platelets: 233 10*3/uL (ref 150–400)
RBC: 3.32 MIL/uL — ABNORMAL LOW (ref 3.87–5.11)
RDW: 14.3 % (ref 11.5–15.5)
WBC: 5.6 10*3/uL (ref 4.0–10.5)
nRBC: 0 % (ref 0.0–0.2)

## 2021-07-25 LAB — COMPREHENSIVE METABOLIC PANEL
ALT: 27 U/L (ref 0–44)
ALT: 28 U/L (ref 0–44)
AST: 53 U/L — ABNORMAL HIGH (ref 15–41)
AST: 51 U/L — ABNORMAL HIGH (ref 15–41)
Albumin: 3.3 g/dL — ABNORMAL LOW (ref 3.5–5.0)
Albumin: 3.4 g/dL — ABNORMAL LOW (ref 3.5–5.0)
Alkaline Phosphatase: 108 U/L (ref 38–126)
Alkaline Phosphatase: 100 U/L (ref 38–126)
Anion gap: 15 (ref 5–15)
Anion gap: 10 (ref 5–15)
BUN: 55 mg/dL — ABNORMAL HIGH (ref 8–23)
BUN: 41 mg/dL — ABNORMAL HIGH (ref 8–23)
CO2: 22 mmol/L (ref 22–32)
CO2: 22 mmol/L (ref 22–32)
Calcium: 8.8 mg/dL — ABNORMAL LOW (ref 8.9–10.3)
Calcium: 8.8 mg/dL — ABNORMAL LOW (ref 8.9–10.3)
Chloride: 88 mmol/L — ABNORMAL LOW (ref 98–111)
Chloride: 98 mmol/L (ref 98–111)
Creatinine, Ser: 2.52 mg/dL — ABNORMAL HIGH (ref 0.44–1.00)
Creatinine, Ser: 1.71 mg/dL — ABNORMAL HIGH (ref 0.44–1.00)
GFR, Estimated: 21 mL/min — ABNORMAL LOW (ref >60)
GFR, Estimated: 33 mL/min — ABNORMAL LOW (ref >60)
Glucose, Bld: 142 mg/dL — ABNORMAL HIGH (ref 70–99)
Glucose, Bld: 113 mg/dL — ABNORMAL HIGH (ref 70–99)
Potassium: 2.3 mmol/L — CL (ref 3.5–5.1)
Potassium: 2.7 mmol/L — CL (ref 3.5–5.1)
Sodium: 125 mmol/L — ABNORMAL LOW (ref 135–145)
Sodium: 130 mmol/L — ABNORMAL LOW (ref 135–145)
Total Bilirubin: 1.6 mg/dL — ABNORMAL HIGH (ref 0.3–1.2)
Total Bilirubin: 1.2 mg/dL (ref 0.3–1.2)
Total Protein: 6.7 g/dL (ref 6.5–8.1)
Total Protein: 7.1 g/dL (ref 6.5–8.1)

## 2021-07-25 LAB — VITAMIN B12: Vitamin B-12: 1053 pg/mL — ABNORMAL HIGH (ref 180–914)

## 2021-07-25 LAB — URINALYSIS, ROUTINE W REFLEX MICROSCOPIC
Glucose, UA: NEGATIVE mg/dL
Ketones, ur: NEGATIVE mg/dL
Nitrite: NEGATIVE
Protein, ur: NEGATIVE mg/dL
Specific Gravity, Urine: <1.005 — ABNORMAL LOW (ref 1.005–1.030)
pH: 7 (ref 5.0–8.0)

## 2021-07-25 LAB — LIPASE, BLOOD: Lipase: 176 U/L — ABNORMAL HIGH (ref 11–51)

## 2021-07-25 LAB — URINALYSIS, MICROSCOPIC (REFLEX)

## 2021-07-25 LAB — HIV ANTIBODY (ROUTINE TESTING W REFLEX): HIV Screen 4th Generation wRfx: NONREACTIVE

## 2021-07-25 LAB — MAGNESIUM
Magnesium: 1.7 mg/dL (ref 1.7–2.4)
Magnesium: 1.6 mg/dL — ABNORMAL LOW (ref 1.7–2.4)

## 2021-07-25 LAB — PROTIME-INR
INR: 1.1 (ref 0.8–1.2)
Prothrombin Time: 13.9 seconds (ref 11.4–15.2)

## 2021-07-25 LAB — PHOSPHORUS: Phosphorus: 2.6 mg/dL (ref 2.5–4.6)

## 2021-07-25 LAB — RESP PANEL BY RT-PCR (FLU A&B, COVID) ARPGX2
Influenza A by PCR: NEGATIVE
Influenza B by PCR: NEGATIVE
SARS Coronavirus 2 by RT PCR: NEGATIVE

## 2021-07-25 LAB — FERRITIN: Ferritin: 1096 ng/mL — ABNORMAL HIGH (ref 11–307)

## 2021-07-25 LAB — OSMOLALITY: Osmolality: 287 mOsm/kg (ref 275–295)

## 2021-07-25 LAB — FOLATE: Folate: 13.9 ng/mL (ref >5.9)

## 2021-07-25 LAB — CK: Total CK: 48 U/L (ref 38–234)

## 2021-07-25 LAB — HEMOGLOBIN A1C
Hgb A1c MFr Bld: 5.5 % (ref 4.8–5.6)
Mean Plasma Glucose: 111.15 mg/dL

## 2021-07-25 MED ORDER — POTASSIUM CHLORIDE 10 MEQ/100ML IV SOLN
10.0000 meq | INTRAVENOUS | Status: AC
  Administered 2021-07-25 (×2): 10 meq via INTRAVENOUS
  Filled 2021-07-25: qty 100

## 2021-07-25 MED ORDER — LORAZEPAM 1 MG PO TABS
0.0000 mg | ORAL_TABLET | Freq: Four times a day (QID) | ORAL | Status: DC
  Filled 2021-07-25: qty 1

## 2021-07-25 MED ORDER — ACETAMINOPHEN 325 MG PO TABS: 650.0000 mg | ORAL_TABLET | Freq: Four times a day (QID) | ORAL | Status: DC | PRN

## 2021-07-25 MED ORDER — LORAZEPAM 2 MG/ML IJ SOLN: 1.0000 mg | INTRAMUSCULAR | Status: DC | PRN

## 2021-07-25 MED ORDER — ONDANSETRON HCL 4 MG PO TABS: 4.0000 mg | ORAL_TABLET | Freq: Four times a day (QID) | ORAL | Status: DC | PRN

## 2021-07-25 MED ORDER — POTASSIUM CHLORIDE 20 MEQ PO PACK
40.0000 meq | PACK | Freq: Two times a day (BID) | ORAL | Status: DC
  Administered 2021-07-25: 40 meq via ORAL
  Filled 2021-07-25: qty 2

## 2021-07-25 MED ORDER — SODIUM CHLORIDE 0.9 % IV BOLUS
1000.0000 mL | Freq: Once | INTRAVENOUS | Status: AC
  Administered 2021-07-25: 1000 mL via INTRAVENOUS

## 2021-07-25 MED ORDER — LACTATED RINGERS IV BOLUS: 1000.0000 mL | Freq: Once | INTRAVENOUS | Status: DC

## 2021-07-25 MED ORDER — ACETAMINOPHEN 650 MG RE SUPP: 650.0000 mg | Freq: Four times a day (QID) | RECTAL | Status: DC | PRN

## 2021-07-25 MED ORDER — LIDOCAINE VISCOUS HCL 2 % MT SOLN
15.0000 mL | Freq: Once | OROMUCOSAL | Status: AC
  Administered 2021-07-25: 15 mL via ORAL
  Filled 2021-07-25: qty 15

## 2021-07-25 MED ORDER — SODIUM CHLORIDE 0.9 % IV SOLN
1.0000 g | Freq: Once | INTRAVENOUS | Status: AC
  Administered 2021-07-25: 1 g via INTRAVENOUS
  Filled 2021-07-25: qty 10

## 2021-07-25 MED ORDER — LORAZEPAM 1 MG PO TABS: 0.0000 mg | ORAL_TABLET | Freq: Two times a day (BID) | ORAL | Status: DC

## 2021-07-25 MED ORDER — ONDANSETRON HCL 4 MG/2ML IJ SOLN: 4.0000 mg | Freq: Four times a day (QID) | INTRAMUSCULAR | Status: DC | PRN

## 2021-07-25 MED ORDER — LORAZEPAM 1 MG PO TABS: 1.0000 mg | ORAL_TABLET | ORAL | Status: DC | PRN

## 2021-07-25 MED ORDER — POTASSIUM CHLORIDE 10 MEQ/100ML IV SOLN: 10.0000 meq | INTRAVENOUS | Status: DC

## 2021-07-25 MED ORDER — THIAMINE HCL 100 MG/ML IJ SOLN
100.0000 mg | Freq: Every day | INTRAMUSCULAR | Status: DC
  Administered 2021-07-25: 100 mg via INTRAVENOUS
  Filled 2021-07-25: qty 2

## 2021-07-25 MED ORDER — FOLIC ACID 1 MG PO TABS
1.0000 mg | ORAL_TABLET | Freq: Every day | ORAL | Status: DC
  Administered 2021-07-26: 1 mg via ORAL
  Filled 2021-07-25: qty 1

## 2021-07-25 MED ORDER — ALUM & MAG HYDROXIDE-SIMETH 200-200-20 MG/5ML PO SUSP
30.0000 mL | Freq: Once | ORAL | Status: AC
  Administered 2021-07-25: 30 mL via ORAL
  Filled 2021-07-25: qty 30

## 2021-07-25 MED ORDER — THIAMINE HCL 100 MG/ML IJ SOLN: 100.0000 mg | Freq: Every day | INTRAMUSCULAR | Status: DC

## 2021-07-25 MED ORDER — SUCRALFATE 1 GM/10ML PO SUSP: 1.0000 g | Freq: Two times a day (BID) | ORAL | Status: DC | PRN

## 2021-07-25 MED ORDER — MAGNESIUM SULFATE 2 GM/50ML IV SOLN
2.0000 g | Freq: Once | INTRAVENOUS | Status: AC
  Administered 2021-07-25: 2 g via INTRAVENOUS
  Filled 2021-07-25: qty 50

## 2021-07-25 MED ORDER — SUCRALFATE 1 G PO TABS
1.0000 g | ORAL_TABLET | Freq: Once | ORAL | Status: AC
  Administered 2021-07-25: 1 g via ORAL
  Filled 2021-07-25: qty 1

## 2021-07-25 MED ORDER — FENTANYL CITRATE PF 50 MCG/ML IJ SOSY
50.0000 ug | PREFILLED_SYRINGE | Freq: Once | INTRAMUSCULAR | Status: AC
  Administered 2021-07-25: 50 ug via INTRAVENOUS
  Filled 2021-07-25: qty 1

## 2021-07-25 MED ORDER — PANTOPRAZOLE SODIUM 40 MG PO TBEC
40.0000 mg | DELAYED_RELEASE_TABLET | Freq: Every day | ORAL | Status: DC
  Administered 2021-07-26: 40 mg via ORAL
  Filled 2021-07-25: qty 1

## 2021-07-25 MED ORDER — MONTELUKAST SODIUM 10 MG PO TABS
10.0000 mg | ORAL_TABLET | Freq: Every day | ORAL | Status: DC
  Administered 2021-07-25: 10 mg via ORAL
  Filled 2021-07-25: qty 1

## 2021-07-25 MED ORDER — POTASSIUM CHLORIDE 10 MEQ/100ML IV SOLN
10.0000 meq | INTRAVENOUS | Status: AC
  Administered 2021-07-25: 10 meq via INTRAVENOUS
  Filled 2021-07-25: qty 100

## 2021-07-25 MED ORDER — ONDANSETRON HCL 4 MG/2ML IJ SOLN
4.0000 mg | Freq: Once | INTRAMUSCULAR | Status: AC
  Administered 2021-07-25: 4 mg via INTRAVENOUS
  Filled 2021-07-25: qty 2

## 2021-07-25 MED ORDER — MOMETASONE FURO-FORMOTEROL FUM 200-5 MCG/ACT IN AERO
2.0000 | INHALATION_SPRAY | Freq: Two times a day (BID) | RESPIRATORY_TRACT | Status: DC
  Administered 2021-07-25 – 2021-07-26 (×2): 2 via RESPIRATORY_TRACT
  Filled 2021-07-25: qty 8.8

## 2021-07-25 MED ORDER — ADULT MULTIVITAMIN W/MINERALS CH
1.0000 | ORAL_TABLET | Freq: Every day | ORAL | Status: DC
  Administered 2021-07-26: 1 via ORAL
  Filled 2021-07-25: qty 1

## 2021-07-25 MED ORDER — ALBUTEROL SULFATE (2.5 MG/3ML) 0.083% IN NEBU: 2.5000 mg | INHALATION_SOLUTION | Freq: Four times a day (QID) | RESPIRATORY_TRACT | Status: DC | PRN

## 2021-07-25 MED ORDER — FLUTICASONE PROPIONATE 50 MCG/ACT NA SUSP: 2.0000 | Freq: Every day | NASAL | Status: DC

## 2021-07-25 MED ORDER — THIAMINE HCL 100 MG PO TABS
100.0000 mg | ORAL_TABLET | Freq: Every day | ORAL | Status: DC
  Administered 2021-07-26: 100 mg via ORAL
  Filled 2021-07-25: qty 1

## 2021-07-25 MED ORDER — POTASSIUM CHLORIDE 10 MEQ/100ML IV SOLN
10.0000 meq | INTRAVENOUS | Status: AC
  Administered 2021-07-25 (×4): 10 meq via INTRAVENOUS
  Filled 2021-07-25 (×4): qty 100

## 2021-07-25 MED ORDER — PANTOPRAZOLE SODIUM 40 MG IV SOLR
40.0000 mg | Freq: Once | INTRAVENOUS | Status: AC
  Administered 2021-07-25: 40 mg via INTRAVENOUS
  Filled 2021-07-25: qty 40

## 2021-07-25 MED ORDER — ENOXAPARIN SODIUM 30 MG/0.3ML IJ SOSY: 30.0000 mg | PREFILLED_SYRINGE | INTRAMUSCULAR | Status: DC

## 2021-07-25 MED ORDER — LACTATED RINGERS IV SOLN: INTRAVENOUS | Status: AC

## 2021-07-25 NOTE — ED Provider Notes (Signed)
[facility] DEPART[facility] Provider Note   CSN: [id number] Arrival date & time: 07/25/21  0941     History Chief Complaint  Patient presents with   Abdominal Pain    Angel Hebert is a 65 y.o. female with past medical history of hypertension, hyperlipidemia, chronic alcohol misuse disorder who presents the emergency department with chief complaint of epigastric abdominal pain.  Patient states that she has 3 months of chronic epigastric abdominal pain and decreased appetite.  Her significant other at bedside states that patient has been able to continue drinking alcohol.  Patient contends that she has not had any alcohol for the past 2 days because her stomach is hurt so bad.  He states that she has not eaten any solid foods in the past 3 days.  She has managed to take a small V8 in the past several days.  Patient denies any melena, hematochezia.  She states she was up all night due to gripping epigastric abdominal pain.  She has stopped taking any NSAIDs at the instruction of her PCP.  Her pain is worse after eating.  She denies vomiting, diarrhea, fevers, chills, chest pain.   Abdominal Pain     Past Medical History:  Diagnosis Date   Arthritis    Asthma    last year in December   Dyspnea    GERD (gastroesophageal reflux disease)    High cholesterol    Hypertension    Pneumonia 01/2017    Patient Active Problem List   Diagnosis Date Noted   Periprosthetic fracture around internal prosthetic left knee joint 04/17/2020   Closed fracture of left distal femur ([place]) 04/16/2020   Symptomatic anemia 07/12/2019   Lobar pneumonia ([place]) 07/12/2019   Insomnia 03/03/2017   History of total knee arthroplasty, left 11/27/2016   Arthritis of knee 10/01/2016   Physical deconditioning    Anemia, iron deficiency    Pressure ulcer 10/14/2015   CAP (community acquired pneumonia)    Sepsis due to Streptococcus pneumoniae ([place])    Hyperlipidemia 03/02/2007    Essential hypertension 03/02/2007   Allergic rhinitis 03/02/2007   Asthma 03/02/2007   GERD 03/02/2007    Past Surgical History:  Procedure Laterality Date   ABDOMINAL HYSTERECTOMY     FEMUR IM NAIL Left 04/17/2020   Procedure: INTRAMEDULLARY (IM) RETROGRADE FEMORAL NAILING;  Surgeon: Meredith Pel, MD;  Location: [facility];  Service: Orthopedics;  Laterality: Left;   nasal polyps     removed just this past tuesday at [facility] facility over by [facility] Left 10/01/2016   Procedure: LEFT TOTAL KNEE ARTHROPLASTY;  Surgeon: Meredith Pel, MD;  Location: [facility];  Service: Orthopedics;  Laterality: Left;     OB History   No obstetric history on file.     Family History  Problem Relation Age of Onset   Diabetes Sister    Healthy Mother    Healthy Father    Colon cancer Neg Hx    Breast cancer Neg Hx     Social History   Tobacco Use   Smoking status: Never   Smokeless tobacco: Never  Vaping Use   Vaping Use: Never used  Substance Use Topics   Alcohol use: Yes    Alcohol/week: 1.0 standard drink    Types: 1 Shots of liquor per week    Comment: socially   Drug use: No    Home Medications Prior to Admission medications   Medication Sig Start Date  End Date Taking? Authorizing Provider  albuterol (PROVENTIL) (2.5 MG/3ML) 0.083% nebulizer solution Take 3 mLs (2.5 mg total) by nebulization every 6 (six) hours as needed for wheezing or shortness of breath. 08/16/20   Charlott Rakes, MD  amoxicillin (AMOXIL) 500 MG capsule Take 2 capsules (1,000 mg total) by mouth 2 (two) times daily. 06/13/21   Charlott Rakes, MD  atorvastatin (LIPITOR) 20 MG tablet Take 1 tablet (20 mg total) by mouth daily at 6 PM. 06/13/21   Charlott Rakes, MD  clarithromycin (BIAXIN) 500 MG tablet Take 1 tablet (500 mg total) by mouth 2 (two) times daily. Hold atorvastatin while on clarithromycin 06/13/21   Charlott Rakes, MD  dicyclomine (BENTYL) 20 MG tablet Take 1 tablet  (20 mg total) by mouth 2 (two) times daily. 05/22/21   Suzy Bouchard, PA-C  enoxaparin (LOVENOX) 30 MG/0.3ML injection Inject 0.3 mLs (30 mg total) into the skin daily. 04/21/20   Magnant, Charles L, PA-C  ferrous sulfate 325 (65 FE) MG EC tablet Take 1 tablet (325 mg total) by mouth 2 (two) times daily. Patient not taking: No sig reported 01/21/20   Charlott Rakes, MD  fluticasone (FLONASE) 50 MCG/ACT nasal spray Place 2 sprays into both nostrils daily. Patient not taking: No sig reported 01/21/20   Charlott Rakes, MD  fluticasone-salmeterol (ADVAIR) 500-50 MCG/ACT AEPB Inhale 1 puff into the lungs in the morning and at bedtime. 06/11/21   Charlott Rakes, MD  furosemide (LASIX) 40 MG tablet Take 1 tablet (40 mg total) by mouth daily. 08/03/19 10/06/19  Charlott Rakes, MD  losartan-hydrochlorothiazide (HYZAAR) 100-25 MG tablet Take 1 tablet by mouth daily. 06/11/21   Charlott Rakes, MD  metoprolol succinate (TOPROL XL) 25 MG 24 hr tablet Take 1 tablet (25 mg total) by mouth daily. 06/11/21   Charlott Rakes, MD  montelukast (SINGULAIR) 10 MG tablet Take 1 tablet (10 mg total) by mouth at bedtime. 06/11/21   Charlott Rakes, MD  oxyCODONE (OXY IR/ROXICODONE) 5 MG immediate release tablet Take 1 tablet (5 mg total) by mouth daily as needed for moderate pain (pain score 4-6). Patient not taking: Reported on 06/11/2021 05/11/20   Magnant, Gerrianne Scale, PA-C  oxyCODONE-acetaminophen (PERCOCET/ROXICET) 5-325 MG tablet Take 1 tablet by mouth every 12 (twelve) hours as needed for severe pain. Patient not taking: Reported on 06/11/2021 04/19/21   Meredith Pel, MD  pantoprazole (PROTONIX) 20 MG tablet Take 1 tablet (20 mg total) by mouth daily. 06/11/21   Charlott Rakes, MD  polyethylene glycol powder (GLYCOLAX/MIRALAX) 17 GM/SCOOP powder Take 17 g by mouth daily. 06/11/21   Charlott Rakes, MD  potassium chloride SA (KLOR-CON) 20 MEQ tablet Take 1 tablet (20 mEq total) by mouth daily. Patient not  taking: No sig reported 01/21/20   Charlott Rakes, MD  PROAIR HFA 108 (90 Base) MCG/ACT inhaler INHALE 2 PUFFS INTO THE LUNGS EVERY 6 (SIX) HOURS AS NEEDED FOR WHEEZING OR SHORTNESS OF BREATH. 06/11/21 06/11/22  Charlott Rakes, MD  traZODone (DESYREL) 50 MG tablet Take 1 tablet (50 mg total) by mouth at bedtime as needed for sleep. 07/21/19 09/16/19  Aline August, MD    Allergies    Patient has no known allergies.  Review of Systems   Review of Systems  Gastrointestinal:  Positive for abdominal pain.  Ten systems reviewed and are negative for acute change, except as noted in the HPI.   Physical Exam Updated Vital Signs BP 91/66 (BP Location: Right Arm)   Pulse 89   Temp 98.4  F (36.9 C) (Oral)   Resp 18   Ht 5\' 7"  (1.702 m)   Wt 52.2 kg   SpO2 99%   BMI 18.01 kg/m   Physical Exam Vitals and nursing note reviewed.  Constitutional:      General: She is not in acute distress.    Appearance: She is well-developed and underweight. She is not diaphoretic.  HENT:     Head: Normocephalic and atraumatic.     Right Ear: External ear normal.     Left Ear: External ear normal.     Nose: Nose normal.     Mouth/Throat:     Mouth: Mucous membranes are moist.  Eyes:     General: No scleral icterus.    Conjunctiva/sclera: Conjunctivae normal.  Cardiovascular:     Rate and Rhythm: Normal rate and regular rhythm.     Heart sounds: Normal heart sounds. No murmur heard.   No friction rub. No gallop.  Pulmonary:     Effort: Pulmonary effort is normal. No respiratory distress.     Breath sounds: Normal breath sounds.  Abdominal:     General: Bowel sounds are normal. There is no distension.     Palpations: Abdomen is soft. There is no mass.     Tenderness: There is abdominal tenderness in the epigastric area. There is no guarding.  Musculoskeletal:     Cervical back: Normal range of motion.     Comments: Knee immobilizer on the left leg  Skin:    General: Skin is warm and dry.   Neurological:     Mental Status: She is alert and oriented to person, place, and time.  Psychiatric:        Behavior: Behavior normal.    ED Results / Procedures / Treatments   Labs (all labs ordered are listed, but only abnormal results are displayed) Labs Reviewed - No data to display  EKG None  Radiology No results found.  Procedures .Critical Care Performed by: Margarita Mail, PA-C Authorized by: Margarita Mail, PA-C   Critical care provider statement:    Critical care time (minutes):  50   Critical care time was exclusive of:  Separately billable procedures and treating other patients   Critical care was necessary to treat or prevent imminent or life-threatening deterioration of the following conditions:  Metabolic crisis and renal failure   Critical care was time spent personally by me on the following activities:  Development of treatment plan with patient or surrogate, discussions with consultants, evaluation of patient's response to treatment, examination of patient, ordering and review of laboratory studies, ordering and review of radiographic studies, ordering and performing treatments and interventions, pulse oximetry, re-evaluation of patient's condition and review of old charts   Medications Ordered in ED Medications - No data to display  ED Course  I have reviewed the triage vital signs and the nursing notes.  Pertinent labs & imaging results that were available during my care of the patient were reviewed by me and considered in my medical decision making (see chart for details).    MDM Rules/Calculators/A&P KD:[id number] abdominal VS:  Vitals:   07/25/21 1100 07/25/21 1130 07/25/21 1230 07/25/21 1330  BP: (!) 89/66 (!) 84/53 97/65 (!) 83/72  Pulse:  80 70 73  Resp: 16 12 18 11   Temp:      TempSrc:      SpO2:  100% 100% 93%  Weight:      Height:        DX:[id number] is gathered  by patient and emr. Previous records obtained and reviewed. DDX:The  patient's complaint of epigastric abdominal involves an extensive number of diagnostic and treatment options, and is a complaint that carries with it a high risk of complications, morbidity, and potential mortality. Given the large differential diagnosis, medical decision making is of high complexity. Differential diagnosis of epigastric pain includes: Functional or nonulcer dyspepsia  PUD, GERD, Gastritis, (NSAIDs, alcohol, stress, H. pylori, pernicious anemia), pancreatitis or pancreatic cancer, overeating indigestion (high-fat foods, coffee), drugs (aspirin, antibiotics (eg, macrolides, metronidazole), corticosteroids, digoxin, narcotics, theophylline), gastroparesis, lactose intolerance, malabsorption gastric cancer, parasitic infection, (Giardia, Strongyloides, Ascaris) cholelithiasis, choledocholithiasis, or cholangitis, ACS, pericarditis, pneumonia, abdominal hernia, pregnancy, intestinal ischemia, esophageal rupture, gastric volvulus, hepatitis.  Labs: I ordered reviewed and interpreted labs which include CBC which shows mild anemia of insignificant value, CMP with hyponatremia, hypokalemia, significant increase in creatinine with AKI up to 2.52 from a baseline of 0.55.  Urine is positive for infection, lipase 176, mag within normal limits.  Imaging:  Consults: Internal medicine teaching service MDM: Patient here with complaint of epigastric abdominal pain.  Patient appears to have acute pancreatitis.  I also suppose that her epigastric pain is likely multifactorial as patient did have significant relief of her symptoms with PPI and GI cocktail.  She may have chronic gastritis or peptic ulcer disease secondary to her chronic alcohol use.  Patient will be admitted for her AKI and electrolyte abnormalities.  I have started her on potassium drip, fluids.  She received IV pain medication as well.  Patient is stable for admission. Patient disposition:The patient appears reasonably stabilized for admission  considering the current resources, flow, and capabilities available in the ED at this time, and I doubt any other [facility] requiring further screening and/or treatment in the ED prior to admission.       Final Clinical Impression(s) / ED Diagnoses Final diagnoses:  None    Rx / DC Orders ED Discharge Orders     None        Margarita Mail, PA-C 07/25/21 1424    Lajean Saver, MD 07/25/21 1530

## 2021-07-25 NOTE — ED Notes (Signed)
Admitting paged to Charge RN regarding bed request

## 2021-07-25 NOTE — ED Notes (Signed)
Pt given gingerale and crackers 

## 2021-07-25 NOTE — ED Notes (Signed)
When original urine specimen was collected, I also collected a urine culture and sent it with the urine sample. I just called the micro lab and spoke with Manuela Schwartz and requested to add the the culture in with the original sample. She stated she would do that.

## 2021-07-25 NOTE — Telephone Encounter (Signed)
Dr Margarita Rana unavailable on 12/30. Left vm to call [phone] to reschedule.

## 2021-07-25 NOTE — H&P (Signed)
Date: 07/25/2021               Patient Name:  Angel Hebert MRN: [id number]  DOB: 06/16/1956 Age / Sex: 65 y.o., female   PCP: Charlott Rakes, MD         Medical Service: Internal Medicine Teaching Service         Attending Physician: Dr. Lucious Groves, DO    First Contact: Scarlett Presto, MD Pager: AD [phone]  Second Contact: Linwood Dibbles, MD Pager: PA [phone]       After Hours (After 5p/  First Contact Pager: [phone]  weekends / holidays): Second Contact Pager: [phone]   SUBJECTIVE  Chief Complaint: Abdominal pain  History of Present Illness: Angel Hebert is a 65 y.o. female with a pertinent PMH of gastritis secondary to H. pylori, hypertension, hyperlipidemia, asthma, anemia, who presents to [facility] with a 3 day history of central/periumbilical abdominal pain.  Patient states that she developed abdominal pain located in the periumbilical region roughly 3 days prior.  During this time she has not been able to eat any food secondary to pain.  She states that she had a history of melanotic stools roughly a month ago but has not since had any hematochezia or melena.  She does have a longstanding history of pain after eating but denies any recent nausea, emesis, hematemesis, or diarrhea.  She is unsure if she has had a history of acid reflux but was recently positive for H. pylori and finished treatment with a negative repeat/follow-up H. pylori test.  She states that she has a significant alcohol use history and drinks multiple 40 ounce beers and several shots of liquor daily.  She states that she has never had a history of withdrawal but this is the longest that she is gone without drinking.  In the ED she was given a GI cocktail which significantly improved her pain and feels as though she can eat again.  She denies any chest pain, shortness of breath, orthopnea, palpitations, fevers, chills, night sweats, dysuria, hematuria sick contacts, recent travel.  She is not had any  recent changes in her medication nor she participated in IV drug use/substance use.  Medications: No current facility-administered medications on file prior to encounter.   Current Outpatient Medications on File Prior to Encounter  Medication Sig Dispense Refill   albuterol (PROVENTIL) (2.5 MG/3ML) 0.083% nebulizer solution Take 3 mLs (2.5 mg total) by nebulization every 6 (six) hours as needed for wheezing or shortness of breath. 75 mL 3   atorvastatin (LIPITOR) 20 MG tablet Take 1 tablet (20 mg total) by mouth daily at 6 PM. 30 tablet 6   fluticasone-salmeterol (ADVAIR) 500-50 MCG/ACT AEPB Inhale 1 puff into the lungs in the morning and at bedtime. 1 each 6   losartan-hydrochlorothiazide (HYZAAR) 100-25 MG tablet Take 1 tablet by mouth daily. 30 tablet 6   montelukast (SINGULAIR) 10 MG tablet Take 1 tablet (10 mg total) by mouth at bedtime. 30 tablet 6   PROAIR HFA 108 (90 Base) MCG/ACT inhaler INHALE 2 PUFFS INTO THE LUNGS EVERY 6 (SIX) HOURS AS NEEDED FOR WHEEZING OR SHORTNESS OF BREATH. 8.5 g 5   amoxicillin (AMOXIL) 500 MG capsule Take 2 capsules (1,000 mg total) by mouth 2 (two) times daily. (Patient not taking: Reported on 07/25/2021) 56 capsule 0   clarithromycin (BIAXIN) 500 MG tablet Take 1 tablet (500 mg total) by mouth 2 (two) times daily. Hold atorvastatin while on clarithromycin (Patient not taking: Reported on  07/25/2021) 28 tablet 0   dicyclomine (BENTYL) 20 MG tablet Take 1 tablet (20 mg total) by mouth 2 (two) times daily. (Patient not taking: Reported on 07/25/2021) 20 tablet 0   enoxaparin (LOVENOX) 30 MG/0.3ML injection Inject 0.3 mLs (30 mg total) into the skin daily. (Patient not taking: Reported on 07/25/2021) 4 mL 0   ferrous sulfate 325 (65 FE) MG EC tablet Take 1 tablet (325 mg total) by mouth 2 (two) times daily. (Patient not taking: Reported on 04/16/2020) 60 tablet 3   fluticasone (FLONASE) 50 MCG/ACT nasal spray Place 2 sprays into both nostrils daily. (Patient not  taking: Reported on 04/16/2020) 16 g 6   furosemide (LASIX) 40 MG tablet Take 1 tablet (40 mg total) by mouth daily. (Patient not taking: Reported on 07/25/2021) 30 tablet 3   metoprolol succinate (TOPROL XL) 25 MG 24 hr tablet Take 1 tablet (25 mg total) by mouth daily. (Patient not taking: Reported on 07/25/2021) 30 tablet 6   oxyCODONE (OXY IR/ROXICODONE) 5 MG immediate release tablet Take 1 tablet (5 mg total) by mouth daily as needed for moderate pain (pain score 4-6). (Patient not taking: Reported on 06/11/2021) 30 tablet 0   oxyCODONE-acetaminophen (PERCOCET/ROXICET) 5-325 MG tablet Take 1 tablet by mouth every 12 (twelve) hours as needed for severe pain. (Patient not taking: Reported on 06/11/2021) 30 tablet 0   pantoprazole (PROTONIX) 20 MG tablet Take 1 tablet (20 mg total) by mouth daily. (Patient not taking: Reported on 07/25/2021) 30 tablet 1   polyethylene glycol powder (GLYCOLAX/MIRALAX) 17 GM/SCOOP powder Take 17 g by mouth daily. (Patient not taking: Reported on 07/25/2021) 3350 g 1   potassium chloride SA (KLOR-CON) 20 MEQ tablet Take 1 tablet (20 mEq total) by mouth daily. (Patient not taking: Reported on 04/16/2020) 30 tablet 6   [DISCONTINUED] traZODone (DESYREL) 50 MG tablet Take 1 tablet (50 mg total) by mouth at bedtime as needed for sleep.      Past Medical History: Past Medical History:  Diagnosis Date   Arthritis    Asthma    last year in December   Dyspnea    GERD (gastroesophageal reflux disease)    High cholesterol    Hypertension    Pneumonia 01/2017    Social:  Lives - [place] Occupation -unemployed Support -partner/boyfriend Level of function -independent PCP -[facility] Substance use -significant alcohol use disorder  Family History: Family History  Problem Relation Age of Onset   Diabetes Sister    Healthy Mother    Healthy Father    Colon cancer Neg Hx    Breast cancer Neg Hx     Allergies: Allergies as of 07/25/2021   (No  Known Allergies)    Review of Systems: A complete ROS was negative except as per HPI.   OBJECTIVE:  Physical Exam: Blood pressure (!) 83/72, pulse 73, temperature 98.4 F (36.9 C), temperature source Oral, resp. rate 11, height 5\' 7"  (1.702 m), weight 52.2 kg, SpO2 93 %. Physical Exam Constitutional:      General: She is not in acute distress.    Appearance: Normal appearance. She is not ill-appearing.  HENT:     Mouth/Throat:     Mouth: Mucous membranes are dry.  Eyes:     Extraocular Movements: Extraocular movements intact.     Pupils: Pupils are equal, round, and reactive to light.  Cardiovascular:     Rate and Rhythm: Normal rate and regular rhythm.     Pulses: Normal pulses.  Heart sounds: Normal heart sounds.  Pulmonary:     Effort: Pulmonary effort is normal. No respiratory distress.     Breath sounds: Normal breath sounds.  Abdominal:     General: Bowel sounds are normal. There is no distension.     Palpations: Abdomen is soft.     Tenderness: There is abdominal tenderness. Guarding: periumbilical.  Musculoskeletal:        General: No swelling or tenderness. Normal range of motion.     Cervical back: Normal range of motion.  Skin:    General: Skin is warm and dry.  Neurological:     General: No focal deficit present.     Mental Status: She is alert and oriented to person, place, and time. Mental status is at baseline.     Motor: Weakness present. No tremor.  Psychiatric:        Mood and Affect: Mood normal.        Behavior: Behavior normal.    Pertinent Labs: CBC    Component Value Date/Time   WBC 5.6 07/25/2021 1000   RBC 3.32 (L) 07/25/2021 1000   HGB 10.6 (L) 07/25/2021 1000   HGB 13.3 06/11/2021 1037   HCT 30.1 (L) 07/25/2021 1000   HCT 39.4 06/11/2021 1037   PLT 233 07/25/2021 1000   PLT 413 06/11/2021 1037   MCV 90.7 07/25/2021 1000   MCV 95 06/11/2021 1037   MCH 31.9 07/25/2021 1000   MCHC 35.2 07/25/2021 1000   RDW 14.3 07/25/2021 1000    RDW 14.5 06/11/2021 1037   LYMPHSABS 2.4 07/25/2021 1000   LYMPHSABS 1.4 06/11/2021 1037   MONOABS 0.5 07/25/2021 1000   EOSABS 0.1 07/25/2021 1000   EOSABS 0.3 06/11/2021 1037   BASOSABS 0.0 07/25/2021 1000   BASOSABS 0.0 06/11/2021 1037     CMP     Component Value Date/Time   NA 125 (L) 07/25/2021 1000   NA 138 08/11/2019 1045   K 2.3 (LL) 07/25/2021 1000   CL 88 (L) 07/25/2021 1000   CO2 22 07/25/2021 1000   GLUCOSE 142 (H) 07/25/2021 1000   BUN 55 (H) 07/25/2021 1000   BUN 9 08/11/2019 1045   CREATININE 2.52 (H) 07/25/2021 1000   CREATININE 0.55 05/20/2016 1452   CALCIUM 8.8 (L) 07/25/2021 1000   PROT 6.7 07/25/2021 1000   PROT 7.7 10/22/2018 0920   ALBUMIN 3.3 (L) 07/25/2021 1000   ALBUMIN 4.2 10/22/2018 0920   AST 53 (H) 07/25/2021 1000   ALT 27 07/25/2021 1000   ALKPHOS 108 07/25/2021 1000   BILITOT 1.6 (H) 07/25/2021 1000   BILITOT <0.2 10/22/2018 0920   GFRNONAA 21 (L) 07/25/2021 1000   GFRNONAA >89 05/20/2016 1452   GFRAA >60 04/19/2020 0325   GFRAA >89 05/20/2016 1452    Pertinent Imaging: No results found.  EKG: pending  ASSESSMENT & PLAN:  Assessment: Principal Problem:   Gastritis   Angel Hebert is a 65 y.o. with pertinent PMH of gastritis secondary to H. pylori, hypertension, hyperlipidemia, asthma, anemia who presented with abdominal pain in the setting of alcohol use disorder and admit for gastritis and at risk for alcohol withdrawal syndrome on hospital day 0  Plan: #Gastritis, Hx. Of H. pylori #Acute on Chronic Pancreatitis  Patient recently had history of H. pylori gastritis and has a chronic history of alcohol use disorder.  Patient's pain improved with GI cocktail.  No high risk features for endoscopy at this time and symptoms well controlled on oral therapy.  Patient  does have a history of pancreatitis and presents with an elevated lipase of 263 and periumbilical abdominal pain concerning for acute on chronic pancreatitis. She  may benefit from Creon administration due to postprandial pain. Will get RUQ to rule out biliary pathology as the patient does have an elevation of her bilirubin and AST.  Although this could be secondary to alcohol use as well. -Continue PPI 40 mg daily -Carafate twice daily as needed -Advance diet as tolerated and moderate fluid resuscitation due to risk for chronic pancreatitis.  - RUQ [place] pending, consider f/u MRCP -Counseled regarding alcohol cessation -GI follow-up for upper endoscopy - PT/INR pending  #Alcohol use disorder Patient has a significant history of alcohol use disorder consuming multiple 40 ounce beers and several shots of liquor daily.  She has been without alcohol for 3 days.  She did not have any signs or symptoms of withdrawal currently but is high risk for alcohol withdrawal syndrome. -CIWA protocol with Ativan every 4 hours -Thiamine, folic acid, multivitamin - Will monitor electrolytes Mg and Phos  #Hyponatremia: Patient likely has decreased solute intake hyponatremia in the setting of significant alcohol use history, gastritis, and decreased p.o. intake. Hyponatremia is mild to moderate  -Urine sodium and osmolality pending -Serum sodium and osmolality pending -We will likely need to fluid resuscitate with crystalloid as she appears dry on exam  Hypokalemia: Patient has hypokalemia of 2.3 and denies any significant symptoms. -Replete potassium and repeat BMP in the morning - EKG pending  #AKI: Presents with significant AKI in the setting of significant abdominal pain and decreased oral intake.  Likely secondary to prerenal azotemia.  Creatinine of 2.52 from a baseline of 0.55 and a new GFR of 21 from greater than 60.  UA showed large leuks, bacteria and hemoglobin.  Patient denies any lower urinary tract symptoms or urinary retention. - Urine Na and Cr pending - UA performed, Urine culture pending - CK pending due to Hgb in the urine - Will consider renal US -  Will give patient 1 liter bolus of LR and start maintenance fluids overnight.  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  At that time she had left ventricular global hypokinesis with at least a grade 1 diastolic dysfunction no significant valvular pathology was noted.  She does not appear volume over loaded on exam and she denies any signs or symptoms concerning for CHF exacerbation. -We will get an EKG -Consider repeating echocardiogram in the near future.  #Normocytic Anemia Has a history of iron deficiency anemia with a new normocytic anemia and MCV of 98.7. -B12, folate, and ferritin level pending -We will get methylmalonic acid if B12 and folate are inconclusive.  #Asthma: -Restart home albuterol, Advair, and Singulair  #HLD: - Will hold atorvastatin in the setting of possible mild alcohol hepatitis  Hyperglycemia: Patient has an elevated blood sugar of 142.  Has a history of an elevated random blood sugar of 183. -Open A1c pending  #HTN: - hold home BP medications.  Best Practice: Diet: advance as tolerated  IVF: Fluids: LR, Rate:  100 cc/hr x 12 hrs VTE: enoxaparin (LOVENOX) injection 30 mg Start: 07/25/21 1600 Code: Full AB: none Status: Observation with expected length of stay less than 2 midnights. Anticipated Discharge Location: Home Barriers to Discharge: Medical stability Family Contact: partner, at bedside.  Signature:  Lawerance Cruel, D.O.  Internal Medicine Resident, PGY-3 [facility]  Pager: [phone] 3:27 PM, 07/25/2021   Please contact the on call pager after 5 pm and on weekends at [phone].

## 2021-07-25 NOTE — ED Notes (Signed)
ED TO INPATIENT HANDOFF REPORT  ED Nurse Name and Phone #: Baxter Flattery, RN   S Name/Age/Gender Angel Hebert 65 y.o. female Room/Bed: 031C/031C  Code Status   Code Status: Full Code  Home/SNF/Other Home Patient oriented to: self, place, time, and situation Is this baseline? Yes   Triage Complete: Triage complete  Chief Complaint Gastritis [K29.70]  Triage Note Husband reports pt has not been eating and been having abdominal pain for the past 3 months. Pt has lost a bunch of weight. Denies any n/v.   Allergies No Known Allergies  Level of Care/Admitting Diagnosis ED Disposition     ED Disposition  Admit   Condition  --   Comment  Hospital Area: [facility] [[id number]]  Level of Care: Med-Surg [16]  May place patient in observation at [facility] or [facility] if equivalent level of care is available:: Yes  Covid Evaluation: Asymptomatic Screening Protocol (No Symptoms)  Diagnosis: Gastritis [786767]  Admitting Physician: Bosie Helper  Attending Physician: Bosie Helper          B Medical/Surgery History Past Medical History:  Diagnosis Date   Arthritis    Asthma    last year in December   Dyspnea    GERD (gastroesophageal reflux disease)    High cholesterol    Hypertension    Pneumonia 01/2017   Past Surgical History:  Procedure Laterality Date   ABDOMINAL HYSTERECTOMY     FEMUR IM NAIL Left 04/17/2020   Procedure: INTRAMEDULLARY (IM) RETROGRADE FEMORAL NAILING;  Surgeon: Meredith Pel, MD;  Location: [facility];  Service: Orthopedics;  Laterality: Left;   nasal polyps     removed just this past tuesday at [facility] facility over by [facility] Left 10/01/2016   Procedure: LEFT TOTAL KNEE ARTHROPLASTY;  Surgeon: Meredith Pel, MD;  Location: [facility];  Service: Orthopedics;  Laterality: Left;     A IV Location/Drains/Wounds Patient Lines/Drains/Airways Status     Active  Line/Drains/Airways     Name Placement date Placement time Site Days   Peripheral IV 07/25/21 20 G 1" Anterior;Left Forearm 07/25/21  1005  Forearm  less than 1   Incision (Closed) 04/17/20 Leg Left 04/17/20  1830  -- 464            Intake/Output Last 24 hours  Intake/Output Summary (Last 24 hours) at 07/25/2021 1549 Last data filed at 07/25/2021 1418 Gross per 24 hour  Intake 2094.46 ml  Output --  Net 2094.46 ml    Labs/Imaging Results for orders placed or performed during the hospital encounter of 07/25/21 (from the past 48 hour(s))  CBC with Differential     Status: Abnormal   Collection Time: 07/25/21 10:00 AM  Result Value Ref Range   WBC 5.6 4.0 - 10.5 K/uL   RBC 3.32 (L) 3.87 - 5.11 MIL/uL   Hemoglobin 10.6 (L) 12.0 - 15.0 g/dL   HCT 30.1 (L) 36.0 - 46.0 %   MCV 90.7 80.0 - 100.0 fL   MCH 31.9 26.0 - 34.0 pg   MCHC 35.2 30.0 - 36.0 g/dL   RDW 14.3 11.5 - 15.5 %   Platelets 233 150 - 400 K/uL   nRBC 0.0 0.0 - 0.2 %   Neutrophils Relative % 46 %   Neutro Abs 2.6 1.7 - 7.7 K/uL   Lymphocytes Relative 43 %   Lymphs Abs 2.4 0.7 - 4.0 K/uL   Monocytes Relative 9 %  Monocytes Absolute 0.5 0.1 - 1.0 K/uL   Eosinophils Relative 1 %   Eosinophils Absolute 0.1 0.0 - 0.5 K/uL   Basophils Relative 1 %   Basophils Absolute 0.0 0.0 - 0.1 K/uL   Immature Granulocytes 0 %   Abs Immature Granulocytes 0.02 0.00 - 0.07 K/uL    Comment: Performed at [facility], [place] [address] [place], [place] [address]  Comprehensive metabolic panel     Status: Abnormal   Collection Time: 07/25/21 10:00 AM  Result Value Ref Range   Sodium 125 (L) 135 - 145 mmol/L   Potassium 2.3 (LL) 3.5 - 5.1 mmol/L    Comment: CRITICAL RESULT CALLED TO, READ BACK BY AND VERIFIED WITH: Ester Rink, RN 1118 07/25/21 L. KLAR    Chloride 88 (L) 98 - 111 mmol/L   CO2 22 22 - 32 mmol/L   Glucose, Bld 142 (H) 70 - 99 mg/dL    Comment: Glucose reference range applies only to samples taken after fasting  for at least 8 hours.   BUN 55 (H) 8 - 23 mg/dL   Creatinine, Ser 2.52 (H) 0.44 - 1.00 mg/dL   Calcium 8.8 (L) 8.9 - 10.3 mg/dL   Total Protein 6.7 6.5 - 8.1 g/dL   Albumin 3.3 (L) 3.5 - 5.0 g/dL   AST 53 (H) 15 - 41 U/L   ALT 27 0 - 44 U/L   Alkaline Phosphatase 108 38 - 126 U/L   Total Bilirubin 1.6 (H) 0.3 - 1.2 mg/dL   GFR, Estimated 21 (L) >60 mL/min    Comment: (NOTE) Calculated using the CKD-EPI Creatinine Equation (2021)    Anion gap 15 5 - 15    Comment: Performed at [facility], [place] [address], [place], [place] [address]  Lipase, blood     Status: Abnormal   Collection Time: 07/25/21 10:00 AM  Result Value Ref Range   Lipase 176 (H) 11 - 51 U/L    Comment: Performed at [facility], [place] [address], [place], [place] [address]  Urinalysis, Routine w reflex microscopic Urine, Clean Catch     Status: Abnormal   Collection Time: 07/25/21 10:00 AM  Result Value Ref Range   Color, Urine YELLOW YELLOW   APPearance TURBID (A) CLEAR   Specific Gravity, Urine <1.005 (L) 1.005 - 1.030   pH 7.0 5.0 - 8.0   Glucose, UA NEGATIVE NEGATIVE mg/dL   Hgb urine dipstick MODERATE (A) NEGATIVE   Bilirubin Urine SMALL (A) NEGATIVE   Ketones, ur NEGATIVE NEGATIVE mg/dL   Protein, ur NEGATIVE NEGATIVE mg/dL   Nitrite NEGATIVE NEGATIVE   Leukocytes,Ua LARGE (A) NEGATIVE    Comment: Performed at [facility] [address] [place], [place] [address]  Urinalysis, Microscopic (reflex)     Status: Abnormal   Collection Time: 07/25/21 10:00 AM  Result Value Ref Range   RBC / HPF 0-5 0 - 5 RBC/hpf   WBC, UA 11-20 0 - 5 WBC/hpf   Bacteria, UA MANY (A) NONE SEEN   Squamous Epithelial / LPF 0-5 0 - 5   Mucus PRESENT     Comment: Performed at [facility], [place] [address], [place], [place] [address]  Resp Panel by RT-PCR (Flu A&B, Covid) Nasopharyngeal Swab     Status: None   Collection Time: 07/25/21 10:01 AM   Specimen: Nasopharyngeal Swab; Nasopharyngeal(NP) swabs in  vial transport medium  Result Value Ref Range   SARS Coronavirus 2  by RT PCR NEGATIVE NEGATIVE    Comment: (NOTE) SARS-CoV-2 target nucleic acids are NOT DETECTED.  The SARS-CoV-2 RNA is generally detectable in upper respiratory specimens during the acute phase of infection. The lowest concentration of SARS-CoV-2 viral copies this assay can detect is 138 copies/mL. A negative result does not preclude SARS-Cov-2 infection and should not be used as the sole basis for treatment or other patient management decisions. A negative result may occur with  improper specimen collection/handling, submission of specimen other than nasopharyngeal swab, presence of viral mutation(s) within the areas targeted by this assay, and inadequate number of viral copies(<138 copies/mL). A negative result must be combined with clinical observations, patient history, and epidemiological information. The expected result is Negative.  Fact Sheet for Patients:  EntrepreneurPulse.com.au  Fact Sheet for Healthcare Providers:  IncredibleEmployment.be  This test is no t yet approved or cleared by the [place] FDA and  has been authorized for detection and/or diagnosis of SARS-CoV-2 by FDA under an Emergency Use Authorization (EUA). This EUA will remain  in effect (meaning this test can be used) for the duration of the COVID-19 declaration under Section 564(b)(1) of the Act, 21 U.S.C.section 360bbb-3(b)(1), unless the authorization is terminated  or revoked sooner.       Influenza A by PCR NEGATIVE NEGATIVE   Influenza B by PCR NEGATIVE NEGATIVE    Comment: (NOTE) The Xpert Xpress SARS-CoV-2/FLU/RSV plus assay is intended as an aid in the diagnosis of influenza from Nasopharyngeal swab specimens and should not be used as a sole basis for treatment. Nasal washings and aspirates are unacceptable for Xpert Xpress SARS-CoV-2/FLU/RSV testing.  Fact Sheet for  Patients: EntrepreneurPulse.com.au  Fact Sheet for Healthcare Providers: IncredibleEmployment.be  This test is not yet approved or cleared by the [place] FDA and has been authorized for detection and/or diagnosis of SARS-CoV-2 by FDA under an Emergency Use Authorization (EUA). This EUA will remain in effect (meaning this test can be used) for the duration of the COVID-19 declaration under Section 564(b)(1) of the Act, 21 U.S.C. section 360bbb-3(b)(1), unless the authorization is terminated or revoked.  Performed at [facility], [place] [address], [place], [place] [address]   Magnesium     Status: None   Collection Time: 07/25/21 11:58 AM  Result Value Ref Range   Magnesium 1.7 1.7 - 2.4 mg/dL    Comment: Performed at [facility] [address], [place], [place] [address]   No results found.  Pending Labs Unresulted Labs (From admission, onward)     Start     Ordered   07/26/21 3875  Basic metabolic panel  Tomorrow morning,   R        07/25/21 1524   07/25/21 1546  Creatinine, urine, random  Add-on,   AD        07/25/21 1545   07/25/21 1545  CK  Once,   R        07/25/21 1544   07/25/21 1524  Hemoglobin A1c  Once,   R        07/25/21 1524   07/25/21 1524  Sodium, urine, random  Add-on,   AD        07/25/21 1524   07/25/21 1524  Osmolality, urine  Add-on,   AD        07/25/21 1524   07/25/21 1524  Osmolality  Once,   R        07/25/21 1524   07/25/21 1523  Phosphorus  Once,   R        07/25/21 1524   07/25/21 1523  Magnesium  Once,   R        07/25/21 1524   07/25/21 1523  Comprehensive metabolic panel  Once,   R        07/25/21 1524   07/25/21 1520  HIV Antibody (routine testing w rflx)  (HIV Antibody (Routine testing w reflex) panel)  Once,   R        07/25/21 1524   07/25/21 1325  Urine Culture  Once,   STAT       Question:  Indication  Answer:  Dysuria   07/25/21 1324            Vitals/Pain Today's  Vitals   07/25/21 1100 07/25/21 1130 07/25/21 1230 07/25/21 1330  BP: (!) 89/66 (!) 84/53 97/65 (!) 83/72  Pulse:  80 70 73  Resp: 16 12 18 11   Temp:      TempSrc:      SpO2:  100% 100% 93%  Weight:      Height:      PainSc:        Isolation Precautions No active isolations  Medications Medications  potassium chloride 10 mEq in 100 mL IVPB (10 mEq Intravenous New Bag/Given 07/25/21 1545)  enoxaparin (LOVENOX) injection 30 mg (has no administration in time range)  acetaminophen (TYLENOL) tablet 650 mg (has no administration in time range)    Or  acetaminophen (TYLENOL) suppository 650 mg (has no administration in time range)  ondansetron (ZOFRAN) tablet 4 mg (has no administration in time range)    Or  ondansetron (ZOFRAN) injection 4 mg (has no administration in time range)  LORazepam (ATIVAN) tablet 1-4 mg (has no administration in time range)    Or  LORazepam (ATIVAN) injection 1-4 mg (has no administration in time range)  thiamine tablet 100 mg (has no administration in time range)    Or  thiamine (B-1) injection 100 mg (has no administration in time range)  folic acid (FOLVITE) tablet 1 mg (has no administration in time range)  multivitamin with minerals tablet 1 tablet (has no administration in time range)  LORazepam (ATIVAN) tablet 0-4 mg (has no administration in time range)    Followed by  LORazepam (ATIVAN) tablet 0-4 mg (has no administration in time range)  sucralfate (CARAFATE) 1 GM/10ML suspension 1 g (has no administration in time range)  pantoprazole (PROTONIX) EC tablet 40 mg (has no administration in time range)  sodium chloride 0.9 % bolus 1,000 mL (0 mLs Intravenous Stopped 07/25/21 1213)  alum & mag hydroxide-simeth (MAALOX/MYLANTA) 200-200-20 MG/5ML suspension 30 mL (30 mLs Oral Given 07/25/21 1011)    And  lidocaine (XYLOCAINE) 2 % viscous mouth solution 15 mL (15 mLs Oral Given 07/25/21 1011)  pantoprazole (PROTONIX) injection 40 mg (40 mg Intravenous  Given 07/25/21 1010)  sucralfate (CARAFATE) tablet 1 g (1 g Oral Given 07/25/21 1010)  sodium chloride 0.9 % bolus 1,000 mL (0 mLs Intravenous Stopped 07/25/21 1344)  fentaNYL (SUBLIMAZE) injection 50 mcg (50 mcg Intravenous Given 07/25/21 1212)  ondansetron (ZOFRAN) injection 4 mg (4 mg Intravenous Given 07/25/21 1212)  cefTRIAXone (ROCEPHIN) 1 g in sodium chloride 0.9 % 100 mL IVPB (0 g Intravenous Stopped 07/25/21 1418)    Mobility walks Low fall risk   Focused Assessments Pulmonary Assessment Handoff:  Lung sounds:   O2 Device: Room Air      R Recommendations: See Admitting Provider Note  Report given to:   Additional Notes:

## 2021-07-25 NOTE — Discharge Instructions (Addendum)
Angel Hebert  You were recently admitted at [facility] for pancreatitis likely caused by alcohol.  We gave you fluids and some electrolytes along with pain medication to help you feel better.  Continue taking your home medication with the following changes  Increase your Protonix dose to 40 mg daily. Sulcrafate- take this up to two times daily as needed for abdominal pain.   Please schedule a follow up appointment with your primary care provider in one week and ask them to get you scheduled to see the stomach doctors as well.  We are so glad you are feeling better.

## 2021-07-25 NOTE — ED Triage Notes (Signed)
Husband reports pt has not been eating and been having abdominal pain for the past 3 months. Pt has lost a bunch of weight. Denies any n/v.

## 2021-07-26 DIAGNOSIS — K852 Alcohol induced acute pancreatitis without necrosis or infection: Secondary | ICD-10-CM | POA: Diagnosis present

## 2021-07-26 LAB — BASIC METABOLIC PANEL
Anion gap: 10 (ref 5–15)
BUN: 31 mg/dL — ABNORMAL HIGH (ref 8–23)
CO2: 22 mmol/L (ref 22–32)
Calcium: 8.9 mg/dL (ref 8.9–10.3)
Chloride: 101 mmol/L (ref 98–111)
Creatinine, Ser: 1.26 mg/dL — ABNORMAL HIGH (ref 0.44–1.00)
GFR, Estimated: 48 mL/min — ABNORMAL LOW (ref >60)
Glucose, Bld: 110 mg/dL — ABNORMAL HIGH (ref 70–99)
Potassium: 3.2 mmol/L — ABNORMAL LOW (ref 3.5–5.1)
Sodium: 133 mmol/L — ABNORMAL LOW (ref 135–145)

## 2021-07-26 LAB — CBC
HCT: 26.5 % — ABNORMAL LOW (ref 36.0–46.0)
Hemoglobin: 9.3 g/dL — ABNORMAL LOW (ref 12.0–15.0)
MCH: 32.6 pg (ref 26.0–34.0)
MCHC: 35.1 g/dL (ref 30.0–36.0)
MCV: 93 fL (ref 80.0–100.0)
Platelets: 215 10*3/uL (ref 150–400)
RBC: 2.85 MIL/uL — ABNORMAL LOW (ref 3.87–5.11)
RDW: 14.7 % (ref 11.5–15.5)
WBC: 5 10*3/uL (ref 4.0–10.5)
nRBC: 0 % (ref 0.0–0.2)

## 2021-07-26 LAB — HEPATIC FUNCTION PANEL
ALT: 24 U/L (ref 0–44)
AST: 45 U/L — ABNORMAL HIGH (ref 15–41)
Albumin: 2.9 g/dL — ABNORMAL LOW (ref 3.5–5.0)
Alkaline Phosphatase: 78 U/L (ref 38–126)
Bilirubin, Direct: 0.3 mg/dL — ABNORMAL HIGH (ref 0.0–0.2)
Indirect Bilirubin: 0.6 mg/dL (ref 0.3–0.9)
Total Bilirubin: 0.9 mg/dL (ref 0.3–1.2)
Total Protein: 6.1 g/dL — ABNORMAL LOW (ref 6.5–8.1)

## 2021-07-26 LAB — PHOSPHORUS: Phosphorus: 1.4 mg/dL — ABNORMAL LOW (ref 2.5–4.6)

## 2021-07-26 LAB — MAGNESIUM: Magnesium: 1.9 mg/dL (ref 1.7–2.4)

## 2021-07-26 MED ORDER — LACTATED RINGERS IV SOLN: INTRAVENOUS | Status: DC

## 2021-07-26 MED ORDER — POTASSIUM CHLORIDE 10 MEQ/100ML IV SOLN
10.0000 meq | INTRAVENOUS | Status: AC
  Administered 2021-07-26: 10 meq via INTRAVENOUS
  Filled 2021-07-26 (×3): qty 100

## 2021-07-26 MED ORDER — POTASSIUM CHLORIDE 20 MEQ PO PACK
40.0000 meq | PACK | Freq: Two times a day (BID) | ORAL | Status: DC
  Administered 2021-07-26: 40 meq via ORAL
  Filled 2021-07-26: qty 2

## 2021-07-26 MED ORDER — PANTOPRAZOLE SODIUM 20 MG PO TBEC: 40.0000 mg | DELAYED_RELEASE_TABLET | Freq: Every day | ORAL | 1 refills | Status: DC

## 2021-07-26 MED ORDER — SUCRALFATE 1 GM/10ML PO SUSP: 1.0000 g | Freq: Two times a day (BID) | ORAL | 0 refills | Status: DC | PRN

## 2021-07-26 NOTE — Progress Notes (Signed)
HD#0 Subjective:  Overnight Events: NAEO   Patient feeling well this morning. She has no more abdominal pain and has been feeling hungry today. She and her significant other other understand that the alcohol intake could be related to the abdominal pain.   Objective:  Vital signs in last 24 hours: Vitals:   07/25/21 1720 07/25/21 2133 07/26/21 0317 07/26/21 0543  BP: (!) 120/94 111/82 106/65 108/67  Pulse: (!) 107 (!) 55 72 96  Resp: 14 16  16   Temp: 97.8 F (36.6 C) 98.1 F (36.7 C)  98.9 F (37.2 C)  TempSrc:  Oral  Oral  SpO2: 92%   100%  Weight:      Height:       Supplemental O2: Room Air SpO2: 100 %   Physical Exam:  Constitutional: cachetic appearing chronically ill woman in no acute distress Cardiovascular: regular rate and rhythm, no m/r/g Pulmonary/Chest: normal work of breathing on room air, lungs clear to auscultation bilaterally Abdominal: soft, non-tender, non-distended MSK: normal bulk and tone Neurological: alert and answering questions appropriately Skin: warm and dry Psych: normal affect  Filed Weights   07/25/21 0955  Weight: 52.2 kg     Intake/Output Summary (Last 24 hours) at 07/26/2021 0645 Last data filed at 07/26/2021 0263 Gross per 24 hour  Intake 3119.69 ml  Output --  Net 3119.69 ml   Net IO Since Admission: 3,119.69 mL [07/26/21 0645]  Pertinent Labs: CBC Latest Ref Rng & Units 07/25/2021 06/11/2021 05/22/2021  WBC 4.0 - 10.5 K/uL 5.6 5.3 5.5  Hemoglobin 12.0 - 15.0 g/dL 10.6(L) 13.3 14.7  Hematocrit 36.0 - 46.0 % 30.1(L) 39.4 43.5  Platelets 150 - 400 K/uL 233 413 382    CMP Latest Ref Rng & Units 07/26/2021 07/25/2021 07/25/2021  Glucose 70 - 99 mg/dL 110(H) 113(H) 142(H)  BUN 8 - 23 mg/dL 31(H) 41(H) 55(H)  Creatinine 0.44 - 1.00 mg/dL 1.26(H) 1.71(H) 2.52(H)  Sodium 135 - 145 mmol/L 133(L) 130(L) 125(L)  Potassium 3.5 - 5.1 mmol/L 3.2(L) 2.7(LL) 2.3(LL)  Chloride 98 - 111 mmol/L 101 98 88(L)  CO2 22 - 32 mmol/L 22  22 22   Calcium 8.9 - 10.3 mg/dL 8.9 8.8(L) 8.8(L)  Total Protein 6.5 - 8.1 g/dL - 7.1 6.7  Total Bilirubin 0.3 - 1.2 mg/dL - 1.2 1.6(H)  Alkaline Phos 38 - 126 U/L - 100 108  AST 15 - 41 U/L - 51(H) 53(H)  ALT 0 - 44 U/L - 28 27    Imaging: US Abdomen Limited RUQ (LIVER/GB)  Result Date: 07/25/2021 CLINICAL DATA:  Periumbilical abdominal pain EXAM: ULTRASOUND ABDOMEN LIMITED RIGHT UPPER QUADRANT COMPARISON:  CT 05/22/2021 FINDINGS: Gallbladder: Small amount of gallbladder sludge but no shadowing stones. The gallbladder is elongated. Normal wall thickness. Negative sonographic Murphy. Common bile duct: Diameter: 5.1 mm Liver: Slightly echogenic. No focal hepatic abnormality. Portal vein is patent on color Doppler imaging with normal direction of blood flow towards the liver. Other: None. IMPRESSION: 1. Small amount of gallbladder sludge but no other sonographic features to suggest acute cholecystitis 2. Slightly echogenic liver suggesting steatosis Electronically Signed   By: Donavan Foil M.D.   On: 07/25/2021 16:51    Assessment/Plan:   Principal Problem:   Gastritis   Patient Summary: Angel Hebert is a 65 y.o. F with pertinent PMH of gastritis secondary to H. pylori, hypertension, hyperlipidemia, asthma, anemia who presented with abdominal pain in the setting of alcohol use disorder and admitted for acute on chronic pancreatitis  and gastritis and is at risk for alcohol withdrawal syndrome.  #Gastritis, Hx. Of H. pylori #Acute on Chronic Pancreatitis  Patient had RUQ ultrasound yesterday which showed some biliary sludge but no other signs of cholecystitis. Her pain is improved today and she wants to eat today. Will advance her diet today and see how she does. If she tolerates food she will likely be good to go home. Discussed staying away from alcohol as that likely contributed to her pain. -Continue PPI 40 mg daily -Carafate twice daily as needed -Advance diet as  tolerated -Counseled regarding alcohol cessation -GI follow-up for upper endoscopy   #Alcohol use disorder Patient has a significant history of alcohol use disorder consuming multiple 40 ounce beers and several shots of liquor daily.  She has been without alcohol for 3 days.  She did not have any signs or symptoms of withdrawal currently but is high risk for alcohol withdrawal syndrome. She has not required ativan so far. -CIWA protocol with Ativan every 4 hours -Thiamine, folic acid, multivitamin   #Hyponatremia;improved Patient likely has decreased solute intake hyponatremia in the setting of significant alcohol use history, gastritis, and decreased p.o. intake.  With administration of fluids this has improved today to 133. -Urine sodium and osmolality pending -Serum sodium and osmolality pending -We will likely need to fluid resuscitate with crystalloid as she appears dry on exam   #Hypokalemia: improving #Hypomagnesemia Patient had significant hypokalemia on admission to 2.3 with mildly low mag. These have been repleted. Mag is now WNL and K has improved to 3.2. Giving more potassium today. -daily BMP   #AKI: improving Presented with significant AKI in the setting of significant abdominal pain and decreased oral intake.  Likely secondary to prerenal azotemia.  Creatinine of 2.52 from a baseline of 0.55 and a new GFR of 21 from greater than 60.  UA showed large leuks, bacteria and hemoglobin.  Patient denies any lower urinary tract symptoms or urinary retention. This has improved to 1.26 on 11/24. Given improvement in fluids likely do not need renal ultrasound at this time.  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  At that time she had left ventricular global hypokinesis with at least a grade 1 diastolic  dysfunction no significant valvular pathology was noted.  She does not appear volume over loaded on exam and she denies any signs or symptoms concerning for CHF exacerbation.EKG showed sinus rhythm with new right axis deviation -Consider repeating echocardiogram in the near future as an outpatient   #Normocytic Anemia Has a history of iron deficiency anemia with a new normocytic anemia and MCV of 98.7. She is not deficient in B12 or folate - daily cbc  #Asthma: -Restart home albuterol, Advair, and Singulair   #HLD: - Will hold atorvastatin in the setting of possible mild alcohol hepatitis   #HTN: - hold home BP medications  Diet: NPO Fluids: LR 100/hr 12 hours VTE: lovenox  Scarlett Presto, MD Internal Medicine Resident PGY-1 Pager [phone] Please contact the on call pager after 5 pm and on weekends at [phone].

## 2021-07-26 NOTE — Progress Notes (Signed)
DISCHARGE NOTE HOME Angel Hebert to be discharged Home per MD order. Discussed prescriptions and follow up appointments with the patient. Prescriptions given to patient; medication list explained in detail. Patient verbalized understanding.  Skin clean, dry and intact without evidence of skin break down, no evidence of skin tears noted. IV catheter discontinued intact. Site without signs and symptoms of complications. Dressing and pressure applied. Pt denies pain at the site currently. No complaints noted.  Patient free of lines, drains, and wounds.   An After Visit Summary (AVS) was printed and given to the patient. Patient escorted via wheelchair, and discharged home via private auto.  Berneta Levins, RN

## 2021-07-26 NOTE — Discharge Summary (Signed)
Name: Angel Hebert MRN: [id number] DOB: September 11, 1955 65 y.o. PCP: Charlott Rakes, MD  Date of Admission: 07/25/2021  9:45 AM Date of Discharge: 07/26/21 Attending Physician: Lucious Groves, DO  Discharge Diagnosis: 1. Gastritis; Acute on chronic pancreatitis 2. Alcohol use disorder 3. Hyponatremia 4. Hypokalemia 5. Hypomagnesemia 6. AKI 7. HFmrEF 8. Normocytic Anemia 9. Asthma 10. HLD 11. HTN  Discharge Medications: Allergies as of 07/26/2021   No Known Allergies      Medication List     STOP taking these medications    amoxicillin 500 MG capsule Commonly known as: AMOXIL   clarithromycin 500 MG tablet Commonly known as: BIAXIN   enoxaparin 30 MG/0.3ML injection Commonly known as: LOVENOX   ferrous sulfate 325 (65 FE) MG EC tablet   oxyCODONE 5 MG immediate release tablet Commonly known as: Oxy IR/ROXICODONE   oxyCODONE-acetaminophen 5-325 MG tablet Commonly known as: PERCOCET/ROXICET   potassium chloride SA 20 MEQ tablet Commonly known as: KLOR-CON       TAKE these medications    albuterol (2.5 MG/3ML) 0.083% nebulizer solution Commonly known as: PROVENTIL Take 3 mLs (2.5 mg total) by nebulization every 6 (six) hours as needed for wheezing or shortness of breath.   ProAir HFA 108 (90 Base) MCG/ACT inhaler Generic drug: albuterol INHALE 2 PUFFS INTO THE LUNGS EVERY 6 (SIX) HOURS AS NEEDED FOR WHEEZING OR SHORTNESS OF BREATH.   atorvastatin 20 MG tablet Commonly known as: LIPITOR Take 1 tablet (20 mg total) by mouth daily at 6 PM.   dicyclomine 20 MG tablet Commonly known as: BENTYL Take 1 tablet (20 mg total) by mouth 2 (two) times daily.   fluticasone 50 MCG/ACT nasal spray Commonly known as: FLONASE Place 2 sprays into both nostrils daily.   fluticasone-salmeterol 500-50 MCG/ACT Aepb Commonly known as: ADVAIR Inhale 1 puff into the lungs in the morning and at bedtime.   furosemide 40 MG tablet Commonly known as: Lasix Take  1 tablet (40 mg total) by mouth daily.   losartan-hydrochlorothiazide 100-25 MG tablet Commonly known as: HYZAAR Take 1 tablet by mouth daily.   metoprolol succinate 25 MG 24 hr tablet Commonly known as: Toprol XL Take 1 tablet (25 mg total) by mouth daily.   montelukast 10 MG tablet Commonly known as: SINGULAIR Take 1 tablet (10 mg total) by mouth at bedtime.   pantoprazole 20 MG tablet Commonly known as: PROTONIX Take 2 tablets (40 mg total) by mouth daily. What changed: how much to take   polyethylene glycol powder 17 GM/SCOOP powder Commonly known as: GLYCOLAX/MIRALAX Take 17 g by mouth daily.   sucralfate 1 GM/10ML suspension Commonly known as: CARAFATE Take 10 mLs (1 g total) by mouth 2 (two) times daily as needed (abdominal pain).        Disposition and follow-up:   [name] was discharged from [facility] in Stable condition.  At the hospital follow up visit please address:  1.  Alcohol use disorder/pancreatitis/gastritis: Ensure patient has been tolerating diet and continue cessation counseling. Also follow up on whether patient has GI follow up scheduled  2. HFmrEF- new right axis deviation, consider ECHO  3. AKI- recheck creatinine  4. Electrolytes- recheck Na, K, and Mg  2.  Labs / imaging needed at time of follow-up: cbc cmp  3.  Pending labs/ test needing follow-up: none  Follow-up Appointments:   Hospital Course by problem list: 1. #Gastritis, Hx. Of H. pylori #Acute on Chronic Pancreatitis  Patient recently had history  of H. pylori gastritis and has a chronic history of alcohol use disorder.  Patient's pain improved with GI cocktail.  Patient has a history of pancreatitis and presents with an elevated lipase of 175 and periumbilical abdominal pain concerning for acute on chronic pancreatitis. RUQ ultrasound which showed some biliary sludge but no other signs of cholecystitis. Her pain improved with fluids. By 11/24 she  was able to eat and her pain was well controlled. She was deemed stable for discharge and will continue carafate BID PRN and PPI 40 daily. Encouraged cessation and instructed patient to schedule a follow up appointment with GI.    #Alcohol use disorder Patient has a significant history of alcohol use disorder consuming multiple 40 ounce beers and several shots of liquor daily.  She was without alcohol for 3 day on admission.  She did not have any signs or symptoms of withdrawal but was high risk for alcohol withdrawal syndrome. She was put on CIWA with ativan but did not require any doses during hospital stay.   #Hyponatremia;improved Patient likely has decreased solute intake hyponatremia in the setting of significant alcohol use history, gastritis, and decreased p.o. intake.  With administration of fluids this improved to 133.  #Hypokalemia:  #Hypomagnesemia Patient had significant hypokalemia on admission to 2.3 with mildly low mag. These were repleted and should normalize now that patient is able to take PO.   #AKI Presented with significant AKI in the setting of significant abdominal pain and decreased oral intake.  Likely secondary to prerenal azotemia.  Creatinine of 2.52 from a baseline of 0.55 and a new GFR of 21 from greater than 60.  UA showed large leuks, bacteria and hemoglobin.  Patient denied any lower urinary tract symptoms or urinary retention. This improved to 1.26 on 11/24 after administration of fluids which fits with an overall prerenal picture. Expect this will remain stable now that patient can tolerate PO.  #HFmrEF: Patient has a history of heart failure with mildly reduced ejection fraction of 45% from an echo back in 2020.  At that time she had left ventricular global hypokinesis with at least a grade 1 diastolic dysfunction no significant valvular pathology was noted.  She did not appear volume over loaded on exam and she denied any signs or symptoms concerning for CHF  exacerbation. EKG showed sinus rhythm with new right axis deviation. Consider repeating echocardiogram in the near future as an outpatient.   #Normocytic Anemia Has a history of iron deficiency anemia with a new normocytic anemia and MCV of 98.7. She was not deficient in B12 or folate and required no transfusions.    #Asthma: Continued home albuterol, Advair, and Singulair   #HLD: Hold atorvastatin in the setting of possible mild alcohol hepatitis but restarted upon discharge.   #HTN: Held home BP medications in setting of hypovolemia but ok to restart on discharge.   Discharge Exam:   BP 92/64 (BP Location: Left Arm)   Pulse 83   Temp 98.1 F (36.7 C) (Oral)   Resp 16   Ht 5\' 7"  (1.702 m)   Wt 52.2 kg   SpO2 100%   BMI 18.01 kg/m  Discharge exam:  Constitutional: cachetic appearing chronically ill woman in no acute distress Cardiovascular: regular rate and rhythm, no m/r/g Pulmonary/Chest: normal work of breathing on room air, lungs clear to auscultation bilaterally Abdominal: soft, non-tender, non-distended MSK: normal bulk and tone Neurological: alert and answering questions appropriately Skin: warm and dry Psych: normal affect  Pertinent Labs,  Studies, and Procedures:  US Abdomen Limited RUQ (LIVER/GB)  Result Date: 07/25/2021 CLINICAL DATA:  Periumbilical abdominal pain EXAM: ULTRASOUND ABDOMEN LIMITED RIGHT UPPER QUADRANT COMPARISON:  CT 05/22/2021 FINDINGS: Gallbladder: Small amount of gallbladder sludge but no shadowing stones. The gallbladder is elongated. Normal wall thickness. Negative sonographic Murphy. Common bile duct: Diameter: 5.1 mm Liver: Slightly echogenic. No focal hepatic abnormality. Portal vein is patent on color Doppler imaging with normal direction of blood flow towards the liver. Other: None. IMPRESSION: 1. Small amount of gallbladder sludge but no other sonographic features to suggest acute cholecystitis 2. Slightly echogenic liver suggesting  steatosis Electronically Signed   By: Donavan Foil M.D.   On: 07/25/2021 16:51    CBC Latest Ref Rng & Units 07/26/2021 07/25/2021 06/11/2021  WBC 4.0 - 10.5 K/uL 5.0 5.6 5.3  Hemoglobin 12.0 - 15.0 g/dL 9.3(L) 10.6(L) 13.3  Hematocrit 36.0 - 46.0 % 26.5(L) 30.1(L) 39.4  Platelets 150 - 400 K/uL 215 233 413   CMP Latest Ref Rng & Units 07/26/2021 07/25/2021 07/25/2021  Glucose 70 - 99 mg/dL 110(H) 113(H) 142(H)  BUN 8 - 23 mg/dL 31(H) 41(H) 55(H)  Creatinine 0.44 - 1.00 mg/dL 1.26(H) 1.71(H) 2.52(H)  Sodium 135 - 145 mmol/L 133(L) 130(L) 125(L)  Potassium 3.5 - 5.1 mmol/L 3.2(L) 2.7(LL) 2.3(LL)  Chloride 98 - 111 mmol/L 101 98 88(L)  CO2 22 - 32 mmol/L 22 22 22   Calcium 8.9 - 10.3 mg/dL 8.9 8.8(L) 8.8(L)  Total Protein 6.5 - 8.1 g/dL 6.1(L) 7.1 6.7  Total Bilirubin 0.3 - 1.2 mg/dL 0.9 1.2 1.6(H)  Alkaline Phos 38 - 126 U/L 78 100 108  AST 15 - 41 U/L 45(H) 51(H) 53(H)  ALT 0 - 44 U/L 24 28 27      Discharge Instructions: Kourtnee Lahey  You were recently admitted at [facility] for pancreatitis likely caused by alcohol.  We gave you fluids and some electrolytes along with pain medication to help you feel better.  Continue taking your home medication with the following changes  Increase your Protonix dose to 40 mg daily. Sulcrafate- take this up to two times daily as needed for abdominal pain.   Please schedule a follow up appointment with your primary care provider in one week and ask them to get you scheduled to see the stomach doctors as well.  We are so glad you are feeling better.  Signed: Scarlett Presto, MD 07/26/2021, 10:28 AM   Pager: [phone]

## 2021-07-27 ENCOUNTER — Telehealth: Payer: Self-pay

## 2021-07-27 DIAGNOSIS — N179 Acute kidney failure, unspecified: Secondary | ICD-10-CM

## 2021-07-27 NOTE — Telephone Encounter (Signed)
Transition Care Management Unsuccessful Follow-up Telephone Call  Date of discharge and from where:  07/26/2021-[facility]  Attempts:  1st Attempt  Reason for unsuccessful TCM follow-up call:  Left voice message

## 2021-07-29 LAB — URINE CULTURE: Culture: >=100000 — AB

## 2021-07-30 ENCOUNTER — Telehealth: Payer: Self-pay

## 2021-07-30 NOTE — Telephone Encounter (Signed)
Transition Care Management Unsuccessful Follow-up Telephone Call  Date of discharge and from where:  07/26/2021, [facility]  Attempts:  1st Attempt  Reason for unsuccessful TCM follow-up call:  Unable to reach patient - called # [phone] and [phone] and the recording stated that the call could not be completed at this time. Call placed to # [phone], the phone rings then goes to fast busy.   Need to discuss scheduling a hospital follow up appointment.

## 2021-07-31 ENCOUNTER — Telehealth: Payer: Self-pay

## 2021-07-31 ENCOUNTER — Other Ambulatory Visit: Payer: Self-pay | Admitting: Family Medicine

## 2021-07-31 MED ORDER — NITROFURANTOIN MONOHYD MACRO 100 MG PO CAPS: 100.0000 mg | ORAL_CAPSULE | Freq: Two times a day (BID) | ORAL | 0 refills | Status: DC

## 2021-07-31 NOTE — Telephone Encounter (Signed)
Transition Care Management Unsuccessful Follow-up Telephone Call  Date of discharge and from where:  07/26/2021, [facility]   Attempts:  2nd Attempt  Reason for unsuccessful TCM follow-up call:  Unable to reach patient- called # [phone] and the recording stated that the call could not be completed at this time. Call placed to # [phone], the phone rings then goes to fast busy.  Called #  [phone] and the phone just rings, no option to leave a message   Need to discuss scheduling a hospital follow up appointment.

## 2021-07-31 NOTE — Telephone Encounter (Signed)
Pt was called and there is no vm set up to leave a message. Called all number on file  Letter has been mailed to patient.

## 2021-07-31 NOTE — Telephone Encounter (Signed)
-----   Message from Charlott Rakes, MD sent at 07/31/2021  5:10 AM EST ----- Urine Culture result from hospitalization showed a UTI. I have sent an antibiotic to her Pharmacy for this.

## 2021-08-01 ENCOUNTER — Telehealth: Payer: Self-pay

## 2021-08-01 NOTE — Telephone Encounter (Signed)
Transition Care Management Unsuccessful Follow-up Telephone Call  Date of discharge and from where:  07/26/2021, [facility]   Attempts:  3rd Attempt  Reason for unsuccessful TCM follow-up call:  Unable to reach patient- - called # [phone] and the recording stated that the call could not be completed at this time. Call placed to # [phone], the phone rings then goes to fast busy.  Called #  [phone] and the phone just rings, no option to leave a message   Letter sent to patient requesting she call [place] to schedule a follow up appointment as we have not been able to reach her.

## 2021-08-07 ENCOUNTER — Telehealth: Payer: Self-pay

## 2021-08-07 NOTE — Telephone Encounter (Signed)
-----   Message from Charlott Rakes, MD sent at 07/31/2021  5:10 AM EST ----- Urine Culture result from hospitalization showed a UTI. I have sent an antibiotic to her Pharmacy for this.

## 2021-08-07 NOTE — Telephone Encounter (Signed)
Pt was called and message states that call can not be completed at this time.

## 2021-08-14 ENCOUNTER — Other Ambulatory Visit: Payer: Self-pay | Admitting: Internal Medicine

## 2021-08-15 ENCOUNTER — Other Ambulatory Visit: Payer: Self-pay | Admitting: Family Medicine

## 2021-08-15 NOTE — Telephone Encounter (Signed)
[facility] was called and notified to send prescription refill request to  patient's PCP.  Pharmacy to do so.

## 2021-08-15 NOTE — Telephone Encounter (Signed)
Requested Prescriptions  Pending Prescriptions Disp Refills   sucralfate (CARAFATE) 1 GM/10ML suspension [Pharmacy Med Name: SUCRALFATE 1 GM/10 ML SUSP] 420 mL 0    Sig: TAKE 10 MLS (1 G TOTAL) BY MOUTH 2 (TWO) TIMES DAILY AS NEEDED (ABDOMINAL PAIN).     Gastroenterology: Antiacids Passed - 08/15/2021  3:20 PM      Passed - Valid encounter within last 12 months    Recent Outpatient Visits          2 months ago [place], MD   1 year ago Pure hypercholesterolemia   [place], Charlane Ferretti, MD   1 year ago Hypertensive heart disease with chronic combined systolic and diastolic congestive heart failure [facility])   [place], Charlane Ferretti, MD   2 years ago Anemia due to other cause, not classified   Hope, Enobong, MD   2 years ago Need for influenza vaccination   [place], RPH-CPP      Future Appointments            In 1 week Thereasa Solo, Dionne Bucy, PA-C [place]

## 2021-08-15 NOTE — Telephone Encounter (Signed)
Not a Clinic patient was seen in hospital.  No follow up appointment with [facility].  Has appointment scheduled with PCP on 08/22/2021.

## 2021-08-22 ENCOUNTER — Encounter: Payer: Self-pay | Admitting: Physician Assistant

## 2021-08-22 ENCOUNTER — Other Ambulatory Visit: Payer: Self-pay

## 2021-08-22 ENCOUNTER — Ambulatory Visit: Payer: Medicaid Other | Attending: Physician Assistant | Admitting: Physician Assistant

## 2021-08-22 VITALS — BP 106/74 | HR 96 | Ht 67.0 in | Wt 117.0 lb

## 2021-08-22 DIAGNOSIS — I10 Essential (primary) hypertension: Secondary | ICD-10-CM | POA: Diagnosis not present

## 2021-08-22 DIAGNOSIS — K2970 Gastritis, unspecified, without bleeding: Secondary | ICD-10-CM | POA: Diagnosis not present

## 2021-08-22 DIAGNOSIS — E876 Hypokalemia: Secondary | ICD-10-CM

## 2021-08-22 DIAGNOSIS — Z09 Encounter for follow-up examination after completed treatment for conditions other than malignant neoplasm: Secondary | ICD-10-CM | POA: Diagnosis not present

## 2021-08-22 DIAGNOSIS — E785 Hyperlipidemia, unspecified: Secondary | ICD-10-CM

## 2021-08-22 DIAGNOSIS — D508 Other iron deficiency anemias: Secondary | ICD-10-CM | POA: Diagnosis not present

## 2021-08-22 DIAGNOSIS — N179 Acute kidney failure, unspecified: Secondary | ICD-10-CM | POA: Diagnosis not present

## 2021-08-22 DIAGNOSIS — I5023 Acute on chronic systolic (congestive) heart failure: Secondary | ICD-10-CM | POA: Diagnosis not present

## 2021-08-22 DIAGNOSIS — K852 Alcohol induced acute pancreatitis without necrosis or infection: Secondary | ICD-10-CM | POA: Diagnosis not present

## 2021-08-22 MED ORDER — PANTOPRAZOLE SODIUM 20 MG PO TBEC
40.0000 mg | DELAYED_RELEASE_TABLET | Freq: Every day | ORAL | 1 refills | Status: DC
Start: 2021-08-22 — End: 2022-10-21

## 2021-08-22 NOTE — Progress Notes (Signed)
Patient ID: Angel Hebert, female   DOB: 14-Sep-1955, 65 y.o.   MRN: [id number]    Alynna Hargrove, is a 65 y.o. female  GGY:[id number]  OJJ:[id number]  DOB - 17-Sep-1955  Chief Complaint  Patient presents with   Hospitalization Follow-up       Subjective:   Angel Hebert is a 65 y.o. female here today for a follow up visit  after hospitalization with pancreatitis and gastritis related to alcohol intake.  She is still drinking "about a 40 ounce beer a day."  She says this is much less than she used to drink.  Appetite is good.  No abdominal pain.  No fever.  Bowels moving normally.    It was recommended she f/up with cardiology/echo.     Date of Admission: 07/25/2021   Date of Discharge: 07/26/21 Attending Physician: Lucious Groves, DO   Discharge Diagnosis: 1. Gastritis; Acute on chronic pancreatitis 2. Alcohol use disorder 3. Hyponatremia 4. Hypokalemia 5. Hypomagnesemia 6. AKI 7. HFmrEF 8. Normocytic Anemia 9. Asthma 10. HLD 11. HTN   1.  Alcohol use disorder/pancreatitis/gastritis: Ensure patient has been tolerating diet and continue cessation counseling. Also follow up on whether patient has GI follow up scheduled   2. HFmrEF- new right axis deviation, consider ECHO   3. AKI- recheck creatinine   4. Electrolytes- recheck Na, K, and Mg   2.  Labs / imaging needed at time of follow-up: cbc cmp   3.  Pending labs/ test needing follow-up: none   Follow-up Appointments:     Hospital Course by problem list: 1. #Gastritis, Hx. Of H. pylori #Acute on Chronic Pancreatitis  Patient recently had history of H. pylori gastritis and has a chronic history of alcohol use disorder.  Patient's pain improved with GI cocktail.  Patient has a history of pancreatitis and presents with an elevated lipase of 937 and periumbilical abdominal pain concerning for acute on chronic pancreatitis. RUQ ultrasound which showed some biliary sludge but no other signs of  cholecystitis. Her pain improved with fluids. By 11/24 she was able to eat and her pain was well controlled. She was deemed stable for discharge and will continue carafate BID PRN and PPI 40 daily. Encouraged cessation and instructed patient to schedule a follow up appointment with GI.    #Alcohol use disorder Patient has a significant history of alcohol use disorder consuming multiple 40 ounce beers and several shots of liquor daily.  She was without alcohol for 3 day on admission.  She did not have any signs or symptoms of withdrawal but was high risk for alcohol withdrawal syndrome. She was put on CIWA with ativan but did not require any doses during hospital stay.   #Hyponatremia;improved Patient likely has decreased solute intake hyponatremia in the setting of significant alcohol use history, gastritis, and decreased p.o. intake.  With administration of fluids this improved to 133.   #Hypokalemia:  #Hypomagnesemia Patient had significant hypokalemia on admission to 2.3 with mildly low mag. These were repleted and should normalize now that patient is able to take PO.   #AKI Presented with significant AKI in the setting of significant abdominal pain and decreased oral intake.  Likely secondary to prerenal azotemia.  Creatinine of 2.52 from a baseline of 0.55 and a new GFR of 21 from greater than 60.  UA showed large leuks, bacteria and hemoglobin.  Patient denied any lower urinary tract symptoms or urinary retention. This improved to 1.26 on 11/24 after administration of fluids which fits with  an overall prerenal picture. Expect this will remain stable now that patient can tolerate PO.   #HFmrEF: Patient has a history of heart failure with mildly reduced ejection fraction of 45% from an echo back in 2020.  At that time she had left ventricular global hypokinesis with at least a grade 1 diastolic dysfunction no significant valvular pathology was noted.  She did not appear volume over loaded on exam  and she denied any signs or symptoms concerning for CHF exacerbation. EKG showed sinus rhythm with new right axis deviation. Consider repeating echocardiogram in the near future as an outpatient.   #Normocytic Anemia Has a history of iron deficiency anemia with a new normocytic anemia and MCV of 98.7. She was not deficient in B12 or folate and required no transfusions.    #Asthma: Continued home albuterol, Advair, and Singulair   #HLD: Hold atorvastatin in the setting of possible mild alcohol hepatitis but restarted upon discharge.   #HTN: Held home BP medications in setting of hypovolemia but ok to restart on discharge.   You were recently admitted at [facility] for pancreatitis likely caused by alcohol.   We gave you fluids and some electrolytes along with pain medication to help you feel better.   Continue taking your home medication with the following changes   Increase your Protonix dose to 40 mg daily. Sulcrafate- take this up to two times daily as needed for abdominal pain.   Please schedule a follow up appointment with your primary care provider in one week and ask them to get you scheduled to see the stomach doctors as well.Patient has No headache, No chest pain, No abdominal pain - No Nausea, No new weakness tingling or numbness, No Cough - SOB.  No problems updated.  ALLERGIES: No Known Allergies  PAST MEDICAL HISTORY: Past Medical History:  Diagnosis Date   Arthritis    Asthma    last year in December   Dyspnea    GERD (gastroesophageal reflux disease)    High cholesterol    Hypertension    Pneumonia 01/2017    MEDICATIONS AT HOME: Prior to Admission medications   Medication Sig Start Date End Date Taking? Authorizing Provider  albuterol (PROVENTIL) (2.5 MG/3ML) 0.083% nebulizer solution Take 3 mLs (2.5 mg total) by nebulization every 6 (six) hours as needed for wheezing or shortness of breath. 08/16/20  Yes Charlott Rakes, MD  atorvastatin  (LIPITOR) 20 MG tablet Take 1 tablet (20 mg total) by mouth daily at 6 PM. 06/13/21  Yes Newlin, Enobong, MD  dicyclomine (BENTYL) 20 MG tablet Take 1 tablet (20 mg total) by mouth 2 (two) times daily. 05/22/21  Yes Aberman, Caroline C, PA-C  fluticasone (FLONASE) 50 MCG/ACT nasal spray Place 2 sprays into both nostrils daily. 01/21/20  Yes Charlott Rakes, MD  fluticasone-salmeterol (ADVAIR) 500-50 MCG/ACT AEPB Inhale 1 puff into the lungs in the morning and at bedtime. 06/11/21  Yes Newlin, Charlane Ferretti, MD  losartan-hydrochlorothiazide (HYZAAR) 100-25 MG tablet Take 1 tablet by mouth daily. 06/11/21  Yes Charlott Rakes, MD  metoprolol succinate (TOPROL XL) 25 MG 24 hr tablet Take 1 tablet (25 mg total) by mouth daily. 06/11/21  Yes Newlin, Charlane Ferretti, MD  montelukast (SINGULAIR) 10 MG tablet Take 1 tablet (10 mg total) by mouth at bedtime. 06/11/21  Yes Newlin, Charlane Ferretti, MD  polyethylene glycol powder (GLYCOLAX/MIRALAX) 17 GM/SCOOP powder Take 17 g by mouth daily. 06/11/21  Yes Charlott Rakes, MD  PROAIR HFA 108 (90 Base) MCG/ACT inhaler INHALE 2  PUFFS INTO THE LUNGS EVERY 6 (SIX) HOURS AS NEEDED FOR WHEEZING OR SHORTNESS OF BREATH. 06/11/21 06/11/22 Yes Newlin, Enobong, MD  sucralfate (CARAFATE) 1 GM/10ML suspension Take 10 mLs (1 g total) by mouth 2 (two) times daily as needed (abdominal pain). 07/26/21  Yes Demaio, Alexa, MD  furosemide (LASIX) 40 MG tablet Take 1 tablet (40 mg total) by mouth daily. Patient not taking: Reported on 07/25/2021 08/03/19 07/25/21  Charlott Rakes, MD  pantoprazole (PROTONIX) 20 MG tablet Take 2 tablets (40 mg total) by mouth daily. 08/22/21   Argentina Donovan, PA-C  traZODone (DESYREL) 50 MG tablet Take 1 tablet (50 mg total) by mouth at bedtime as needed for sleep. 07/21/19 09/16/19  Aline August, MD    ROS: Neg HEENT Neg resp Neg cardiac Neg GI Neg GU Neg MS Neg psych Neg neuro  Objective:   Vitals:   08/22/21 1343  BP: 106/74  Pulse: 96  SpO2: 99%   Weight: 117 lb (53.1 kg)  Height: 5\' 7"  (1.702 m)   Exam General appearance : Awake, alert, not in any distress. Speech Clear. Not toxic looking;  poor dentition HEENT: Atraumatic and Normocephalic Neck: Supple, no JVD. No cervical lymphadenopathy.  Chest: Good air entry bilaterally, CTAB.  No rales/rhonchi/wheezing CVS: S1 S2 regular, no murmurs.  Extremities: B/L Lower Ext shows no edema, both legs are warm to touch Neurology: Awake alert, and oriented X 3, CN II-XII intact, Non focal Skin: No Rash  Data Review Lab Results  Component Value Date   HGBA1C 5.5 07/25/2021   HGBA1C 5.3 10/26/2015    Assessment & Plan   1. Hypokalemia - Comprehensive metabolic panel  2. Gastritis, presence of bleeding unspecified, unspecified chronicity, unspecified gastritis type - Comprehensive metabolic panel - pantoprazole (PROTONIX) 20 MG tablet; Take 2 tablets (40 mg total) by mouth daily.  Dispense: 30 tablet; Refill: 1  3. Other iron deficiency anemia - CBC with Differential/Platelet  4. Alcohol-induced acute pancreatitis without infection or necrosis [place].org is the website for narcotics anonymous [place] (website) or 9368592067 is the information for alcoholics anonymous Both are free and immediately available for help with alcohol and drug use I have counseled the patient at length about substance abuse and addiction.  12 step meetings/recovery recommended.  Local 12 step meeting lists were given and attendance was encouraged.  Patient expresses understanding.  - Lipase - CBC with Differential/Platelet - Magnesium  5. Hyperlipidemia, unspecified hyperlipidemia type Continue atorvastatin  6. Essential hypertension Controlled-Continue metoprolol and losartan/HCTZ  7. AKI (acute kidney injury) ([place]) - Comprehensive metabolic panel  8. Acute on chronic HFrEF (heart failure with reduced ejection fraction) ([place]) - Ambulatory referral to Cardiology  9. Hospital  discharge follow-up Much improved    Patient have been counseled extensively about nutrition and exercise. Other issues discussed during this visit include: low cholesterol diet, weight control and daily exercise, foot care, annual eye examinations at Ophthalmology, importance of adherence with medications and regular follow-up. We also discussed long term complications of uncontrolled diabetes and hypertension.   Return in about 3 months (around 11/20/2021) for PCP for chronic conditions.  The patient was given clear instructions to go to ER or return to medical center if symptoms don't improve, worsen or new problems develop. The patient verbalized understanding. The patient was told to call to get lab results if they haven't heard anything in the next week.      Freeman Caldron, PA-C [facility] and [facility] [place], [place]  08/22/2021, 1:59 PM

## 2021-08-22 NOTE — Patient Instructions (Signed)
[place].org is the website for narcotics anonymous [employer] (website) or 336-854-4278 is the information for alcoholics anonymous Both are free and immediately available for help with alcohol and drug use  

## 2021-08-23 ENCOUNTER — Other Ambulatory Visit: Payer: Self-pay | Admitting: Physician Assistant

## 2021-08-23 LAB — COMPREHENSIVE METABOLIC PANEL
ALT: 21 IU/L (ref 0–32)
AST: 59 IU/L — ABNORMAL HIGH (ref 0–40)
Albumin/Globulin Ratio: 1.5 (ref 1.2–2.2)
Albumin: 4.3 g/dL (ref 3.8–4.8)
Alkaline Phosphatase: 103 IU/L (ref 44–121)
BUN/Creatinine Ratio: 11 — ABNORMAL LOW (ref 12–28)
BUN: 11 mg/dL (ref 8–27)
Bilirubin Total: 1.4 mg/dL — ABNORMAL HIGH (ref 0.0–1.2)
CO2: 23 mmol/L (ref 20–29)
Calcium: 9 mg/dL (ref 8.7–10.3)
Chloride: 103 mmol/L (ref 96–106)
Creatinine, Ser: 0.97 mg/dL (ref 0.57–1.00)
Globulin, Total: 2.9 g/dL (ref 1.5–4.5)
Glucose: 130 mg/dL — ABNORMAL HIGH (ref 70–99)
Potassium: 3.7 mmol/L (ref 3.5–5.2)
Sodium: 143 mmol/L (ref 134–144)
Total Protein: 7.2 g/dL (ref 6.0–8.5)
eGFR: 65 mL/min/{1.73_m2} (ref >59)

## 2021-08-23 LAB — LIPASE: Lipase: 140 U/L — ABNORMAL HIGH (ref 14–72)

## 2021-08-23 LAB — CBC WITH DIFFERENTIAL/PLATELET
Basophils Absolute: 0 10*3/uL (ref 0.0–0.2)
Basos: 1 %
EOS (ABSOLUTE): 0.1 10*3/uL (ref 0.0–0.4)
Eos: 2 %
Hematocrit: 29.4 % — ABNORMAL LOW (ref 34.0–46.6)
Hemoglobin: 9.9 g/dL — ABNORMAL LOW (ref 11.1–15.9)
Immature Grans (Abs): 0 10*3/uL (ref 0.0–0.1)
Immature Granulocytes: 0 %
Lymphocytes Absolute: 2.2 10*3/uL (ref 0.7–3.1)
Lymphs: 45 %
MCH: 32.1 pg (ref 26.6–33.0)
MCHC: 33.7 g/dL (ref 31.5–35.7)
MCV: 96 fL (ref 79–97)
Monocytes Absolute: 0.5 10*3/uL (ref 0.1–0.9)
Monocytes: 11 %
Neutrophils Absolute: 1.9 10*3/uL (ref 1.4–7.0)
Neutrophils: 41 %
Platelets: 310 10*3/uL (ref 150–450)
RBC: 3.08 x10E6/uL — ABNORMAL LOW (ref 3.77–5.28)
RDW: 14.8 % (ref 11.7–15.4)
WBC: 4.7 10*3/uL (ref 3.4–10.8)

## 2021-08-23 LAB — MAGNESIUM: Magnesium: 1 mg/dL — ABNORMAL LOW (ref 1.6–2.3)

## 2021-08-23 MED ORDER — MAGNESIUM 300 MG PO CAPS: 1.0000 | ORAL_CAPSULE | Freq: Every day | ORAL | 0 refills | Status: DC

## 2021-08-23 MED ORDER — IRON (FERROUS SULFATE) 325 (65 FE) MG PO TABS: 325.0000 mg | ORAL_TABLET | Freq: Every day | ORAL | 3 refills | Status: DC

## 2021-08-31 ENCOUNTER — Ambulatory Visit: Payer: Medicaid Other | Admitting: Family Medicine

## 2021-08-31 ENCOUNTER — Other Ambulatory Visit: Payer: Self-pay | Admitting: Internal Medicine

## 2021-08-31 DIAGNOSIS — K2970 Gastritis, unspecified, without bleeding: Secondary | ICD-10-CM

## 2021-09-10 ENCOUNTER — Ambulatory Visit: Payer: Self-pay

## 2021-09-10 NOTE — Telephone Encounter (Signed)
Called patient to assess chest pain and appt need. Unable to reach. No answer.

## 2021-09-10 NOTE — Telephone Encounter (Signed)
°  Chief Complaint: chest pain and soreness  Symptoms: chest hurts with movement and with deep breath Frequency: 2 days ago Pertinent Negatives: denies radiation, dizziness, N/V, sweating, difficulty breathing Disposition: [] ED /[] Urgent Care (no appt availability in office) / [x] Appointment(In office/virtual)/ []  [place] [facility]/ [] Home Care/ [] Refused Recommended Disposition /[] [place] [facility]/ []  Follow-up with PCP Additional Notes: pt needing appt.      Reason for Disposition  Taking a deep breath makes pain worse  Answer Assessment - Initial Assessment Questions 1. LOCATION: "Where does it hurt?"      Top of chest and to the right 2. RADIATION: "Does the pain go anywhere else?" (e.g., into neck, jaw, arms, back)     no 3. ONSET: "When did the chest pain begin?" (Minutes, hours or days)       2 days ago 4. PATTERN "Does the pain come and go, or has it been constant since it started?"  "Does it get worse with exertion?"      Worse when moving 5. DURATION: "How long does it last" (e.g., seconds, minutes, hours)     *No Answer* 6. SEVERITY: "How bad is the pain?"  (e.g., Scale 1-10; mild, moderate, or severe)    - MILD (1-3): doesn't interfere with normal activities     - MODERATE (4-7): interferes with normal activities or awakens from sleep    - SEVERE (8-10): excruciating pain, unable to do any normal activities       *No Answer* 7. CARDIAC RISK FACTORS: "Do you have any history of heart problems or risk factors for heart disease?" (e.g., angina, prior heart attack; diabetes, high blood pressure, high cholesterol, smoker, or strong family history of heart disease)     *No Answer* 8. PULMONARY RISK FACTORS: "Do you have any history of lung disease?"  (e.g., blood clots in lung, asthma, emphysema, birth control pills)     *No Answer* 9. CAUSE: "What do you think is causing the chest pain?"     *No Answer* 10. OTHER SYMPTOMS: "Do you have any other symptoms?" (e.g.,  dizziness, nausea, vomiting, sweating, fever, difficulty breathing, cough)       no 11. PREGNANCY: "Is there any chance you are pregnant?" "When was your last menstrual period?"       *No Answer*  Protocols used: Chest Pain-A-AH

## 2021-09-11 NOTE — Telephone Encounter (Signed)
Attempt to call patient. No answer at either contact listed in chart.

## 2021-11-01 ENCOUNTER — Ambulatory Visit: Payer: Medicaid Other | Admitting: Orthopedic Surgery

## 2021-11-16 ENCOUNTER — Ambulatory Visit: Payer: Self-pay | Admitting: Orthopedic Surgery

## 2021-11-20 ENCOUNTER — Ambulatory Visit: Payer: Medicaid Other | Admitting: Family Medicine

## 2021-12-26 ENCOUNTER — Other Ambulatory Visit: Payer: Self-pay | Admitting: Family Medicine

## 2021-12-26 DIAGNOSIS — I10 Essential (primary) hypertension: Secondary | ICD-10-CM

## 2021-12-26 DIAGNOSIS — J4520 Mild intermittent asthma, uncomplicated: Secondary | ICD-10-CM

## 2021-12-26 NOTE — Telephone Encounter (Signed)
Should I refill Losartan-HCTZ ?

## 2022-01-19 ENCOUNTER — Other Ambulatory Visit: Payer: Self-pay | Admitting: Family Medicine

## 2022-01-19 DIAGNOSIS — J4520 Mild intermittent asthma, uncomplicated: Secondary | ICD-10-CM

## 2022-01-19 DIAGNOSIS — I10 Essential (primary) hypertension: Secondary | ICD-10-CM

## 2022-01-21 ENCOUNTER — Ambulatory Visit: Payer: Self-pay | Admitting: *Deleted

## 2022-01-21 NOTE — Telephone Encounter (Signed)
Noted  

## 2022-01-21 NOTE — Telephone Encounter (Signed)
  Chief Complaint: Angel Butts, NP with [facility] there doing a visit.   Pt is having abd pain around navel and having difficulty eating.   Been drinking alcohol daily. Symptoms: abd pain around navel area and not eating for about a week. Frequency: A week  Pertinent Negatives: Patient denies Urinary symptoms or diarrhea or vomiting.   Just the abd pain. Disposition: '[x]'$ ED /'[]'$ Urgent Care (no appt availability in office) / '[]'$ Appointment(In office/virtual)/ '[]'$  [place] [facility]/ '[]'$ Home Care/ '[]'$ Refused Recommended Disposition /'[]'$ [place] [facility]/ '[]'$  Follow-up with PCP Additional Notes: Pt agreeable to going to the ED at [facility].  I made an appt with Dr. Margarita Rana at [facility] for 03/19/2022 at 3:10 PM.

## 2022-01-21 NOTE — Telephone Encounter (Signed)
Reason for Disposition  [1] SEVERE pain AND [2] age > 60 years  Answer Assessment - Initial Assessment Questions 1. LOCTION: "Where does it hurt?"      Pt having abd pain.    Pain around my navel. 2. RADIATION: "Does the pain shoot anywhere else?" (e.g., chest, back)     No She has a nurse with her now.  I thought it came from drinking beer.   Angel Butts, NP with [facility] got on phone line.      Lower abd pain, not eating well.   I talked with her about daily intake of alcohol and how it affects her.   She can't remember things.     Pt got back on line with me.  No vomiting or diarrhea.   Not tender to palpation.   No urinary symptoms or diarrhea or constipation. 3. ONSET: "When did the pain begin?" (e.g., minutes, hours or days ago)      3-4 weeks ago.   She can't eat too good.    She had this problem before.  Dr. Margarita Rana gave her some liquid stuff to take every day.    Her BMs were black at that time.   Not having them now. 4. SUDDEN: "Gradual or sudden onset?"      5. PATTERN "Does the pain come and go, or is it constant?"    - If constant: "Is it getting better, staying the same, or worsening?"      (Note: Constant means the pain never goes away completely; most serious pain is constant and it progresses)     - If intermittent: "How long does it last?" "Do you have pain now?"     (Note: Intermittent means the pain goes away completely between bouts)      6. SEVERITY: "How bad is the pain?"  (e.g., Scale 1-10; mild, moderate, or severe)   - MILD (1-3): doesn't interfere with normal activities, abdomen soft and not tender to touch    - MODERATE (4-7): interferes with normal activities or awakens from sleep, abdomen tender to touch    - SEVERE (8-10): excruciating pain, doubled over, unable to do any normal activities       7. RECURRENT SYMPTOM: "Have you ever had this type of stomach pain before?" If Yes, ask: "When was the last time?" and "What happened that time?"      Yes   8. CAUSE:  "What do you think is causing the stomach pain?"     Per NP Angel Hebert she drinks alcohol daily 9. RELIEVING/AGGRAVATING FACTORS: "What makes it better or worse?" (e.g., movement, antacids, bowel movement)      10. OTHER SYMPTOMS: "Do you have any other symptoms?" (e.g., back pain, diarrhea, fever, urination pain, vomiting)       Denies diarrhea or urinary symptoms. 11. PREGNANCY: "Is there any chance you are pregnant?" "When was your last menstrual period?"       N/A  Protocols used: Abdominal Pain - [facility]

## 2022-01-22 NOTE — Telephone Encounter (Signed)
Requested medication (s) are due for refill today:   Yes for both  Requested medication (s) are on the active medication list:   Yes for both  Future visit scheduled:   Yes in July  No Show for 11/20/2021 appt.   Last ordered: 12/27/2021 #30, 0 refills for both.  Returned for provider to review for refills    Requested Prescriptions  Pending Prescriptions Disp Refills   montelukast (SINGULAIR) 10 MG tablet [Pharmacy Med Name: MONTELUKAST SOD 10 MG TABLET] 30 tablet 0    Sig: TAKE 1 TABLET BY MOUTH EVERYDAY AT BEDTIME     Pulmonology:  Leukotriene Inhibitors Passed - 01/19/2022  2:33 PM      Passed - Valid encounter within last 12 months    Recent Outpatient Visits           5 months ago Hypokalemia   [place], [place], [place]   7 months ago [place], [place], MD   2 years ago Pure hypercholesterolemia   [place], Charlane Ferretti, MD   2 years ago Hypertensive heart disease with chronic combined systolic and diastolic congestive heart failure ([place])   [place], Charlane Ferretti, MD   2 years ago Anemia due to other cause, not classified   [place], Charlane Ferretti, MD       Future Appointments             In 1 month Charlott Rakes, MD T[place] (HYZAAR) 100-25 MG tablet [Pharmacy Med Name: LOSARTAN-HCTZ 100-25 MG TAB] 30 tablet 0    Sig: TAKE 1 TABLET BY MOUTH EVERY DAY     Cardiovascular: ARB + Diuretic Combos Passed - 01/19/2022  2:33 PM      Passed - K in normal range and within 180 days    Potassium  Date Value Ref Range Status  08/22/2021 3.7 3.5 - 5.2 mmol/L Final         Passed - Na in normal range and within 180 days    Sodium  Date Value Ref Range Status  08/22/2021 143 134 - 144 mmol/L Final          Passed - Cr in normal range and within 180 days    Creat  Date Value Ref Range Status  05/20/2016 0.55 0.50 - 1.05 mg/dL Final    Comment:      For patients > or = 65 years of age: The upper reference limit for Creatinine is approximately 13% higher for people identified as African-American.      Creatinine, Ser  Date Value Ref Range Status  08/22/2021 0.97 0.57 - 1.00 mg/dL Final         Passed - eGFR is 10 or above and within 180 days    GFR, Est African American  Date Value Ref Range Status  05/20/2016 >89 >=60 mL/min Final   GFR calc Af Amer  Date Value Ref Range Status  04/19/2020 >60 >60 mL/min Final   GFR, Est Non African American  Date Value Ref Range Status  05/20/2016 >89 >=60 mL/min Final   GFR, Estimated  Date Value Ref Range Status  07/26/2021 48 (L) >60 mL/min Final    Comment:    (NOTE) Calculated using the CKD-EPI Creatinine Equation (2021)  eGFR  Date Value Ref Range Status  08/22/2021 65 >59 mL/min/1.73 Final         Passed - Patient is not pregnant      Passed - Last BP in normal range    BP Readings from Last 1 Encounters:  08/22/21 106/74         Passed - Valid encounter within last 6 months    Recent Outpatient Visits           5 months ago Hypokalemia   [place], [place], [place]   7 months ago [place], Charlane Ferretti, MD   2 years ago Pure hypercholesterolemia   [place], Charlane Ferretti, MD   2 years ago Hypertensive heart disease with chronic combined systolic and diastolic congestive heart failure [facility])   [place], Charlane Ferretti, MD   2 years ago Anemia due to other cause, not classified   [place], MD       Future Appointments             In 1 month Charlott Rakes, MD [place]

## 2022-02-21 ENCOUNTER — Other Ambulatory Visit: Payer: Self-pay | Admitting: Family Medicine

## 2022-02-21 DIAGNOSIS — I10 Essential (primary) hypertension: Secondary | ICD-10-CM

## 2022-02-21 DIAGNOSIS — J4520 Mild intermittent asthma, uncomplicated: Secondary | ICD-10-CM

## 2022-02-23 ENCOUNTER — Other Ambulatory Visit: Payer: Self-pay | Admitting: Family Medicine

## 2022-02-23 DIAGNOSIS — E7800 Pure hypercholesterolemia, unspecified: Secondary | ICD-10-CM

## 2022-03-07 ENCOUNTER — Other Ambulatory Visit: Payer: Self-pay | Admitting: Physician Assistant

## 2022-03-07 DIAGNOSIS — K2970 Gastritis, unspecified, without bleeding: Secondary | ICD-10-CM

## 2022-03-12 ENCOUNTER — Other Ambulatory Visit: Payer: Self-pay | Admitting: Family Medicine

## 2022-03-12 DIAGNOSIS — J4520 Mild intermittent asthma, uncomplicated: Secondary | ICD-10-CM

## 2022-03-12 DIAGNOSIS — I10 Essential (primary) hypertension: Secondary | ICD-10-CM

## 2022-03-19 ENCOUNTER — Ambulatory Visit: Payer: Medicaid Other | Admitting: Family Medicine

## 2022-05-08 ENCOUNTER — Other Ambulatory Visit: Payer: Self-pay | Admitting: Family Medicine

## 2022-05-08 DIAGNOSIS — J4520 Mild intermittent asthma, uncomplicated: Secondary | ICD-10-CM

## 2022-05-08 DIAGNOSIS — E7800 Pure hypercholesterolemia, unspecified: Secondary | ICD-10-CM

## 2022-05-08 MED ORDER — ATORVASTATIN CALCIUM 20 MG PO TABS: 20.0000 mg | ORAL_TABLET | Freq: Every day | ORAL | 0 refills | Status: DC

## 2022-05-13 ENCOUNTER — Other Ambulatory Visit: Payer: Self-pay

## 2022-05-14 ENCOUNTER — Other Ambulatory Visit: Payer: Self-pay | Admitting: Family Medicine

## 2022-05-14 DIAGNOSIS — I10 Essential (primary) hypertension: Secondary | ICD-10-CM

## 2022-05-14 NOTE — Telephone Encounter (Signed)
Copied from [place] [id number]. Topic: General - Other >> May 14, 2022  4:25 PM Everette C wrote: Reason for CRM: Medication Refill - Medication: losartan-hydrochlorothiazide (HYZAAR) 100-25 MG tablet [[id number]]   Has the patient contacted their pharmacy? Yes.  The patient's [place] representative has made contact on the patient's behalf  (Agent: If no, request that the patient contact the pharmacy for the refill. If patient does not wish to contact the pharmacy document the reason why and proceed with request.) (Agent: If yes, when and what did the pharmacy advise?)  Preferred Pharmacy (with phone number or street name): [id number]Phone: [phone]: [phone]: Not open 24 hours  Has the patient been seen for an appointment in the last year OR does the patient have an upcoming appointment? Yes.    Agent: Please be advised that RX refills may take up to 3 business days. We ask that you follow-up with your pharmacy.

## 2022-05-15 ENCOUNTER — Ambulatory Visit: Payer: Medicaid Other | Admitting: Physician Assistant

## 2022-05-16 MED ORDER — LOSARTAN POTASSIUM-HCTZ 100-25 MG PO TABS: 1.0000 | ORAL_TABLET | Freq: Every day | ORAL | 0 refills | Status: DC

## 2022-05-16 NOTE — Telephone Encounter (Signed)
Requested medication (s) are due for refill today: yes  Requested medication (s) are on the active medication list: yes    Last refill: 03/13/22  #30  0 refills  Future visit scheduled yes 06/06/22  Notes to clinic:  Labs due.  Pt has multiple appt  no shows and cancellations, please review.  Requested Prescriptions  Pending Prescriptions Disp Refills   losartan-hydrochlorothiazide (HYZAAR) 100-25 MG tablet 30 tablet 0    Sig: Take 1 tablet by mouth daily.     Cardiovascular: ARB + Diuretic Combos Failed - 05/14/2022  5:21 PM      Failed - K in normal range and within 180 days    Potassium  Date Value Ref Range Status  08/22/2021 3.7 3.5 - 5.2 mmol/L Final         Failed - Na in normal range and within 180 days    Sodium  Date Value Ref Range Status  08/22/2021 143 134 - 144 mmol/L Final         Failed - Cr in normal range and within 180 days    Creat  Date Value Ref Range Status  05/20/2016 0.55 0.50 - 1.05 mg/dL Final    Comment:      For patients > or = 65 years of age: The upper reference limit for Creatinine is approximately 13% higher for people identified as African-American.      Creatinine, Ser  Date Value Ref Range Status  08/22/2021 0.97 0.57 - 1.00 mg/dL Final         Failed - eGFR is 10 or above and within 180 days    GFR, Est African American  Date Value Ref Range Status  05/20/2016 >89 >=60 mL/min Final   GFR calc Af Amer  Date Value Ref Range Status  04/19/2020 >60 >60 mL/min Final   GFR, Est Non African American  Date Value Ref Range Status  05/20/2016 >89 >=60 mL/min Final   GFR, Estimated  Date Value Ref Range Status  07/26/2021 48 (L) >60 mL/min Final    Comment:    (NOTE) Calculated using the CKD-EPI Creatinine Equation (2021)    eGFR  Date Value Ref Range Status  08/22/2021 65 >59 mL/min/1.73 Final         Failed - Valid encounter within last 6 months    Recent Outpatient Visits           8 months ago Hypokalemia   [place], [place], [place]   11 months ago [place], MD   2 years ago Pure hypercholesterolemia   [place], Charlane Ferretti, MD   2 years ago Hypertensive heart disease with chronic combined systolic and diastolic congestive heart failure [facility])   [place], Charlane Ferretti, MD   2 years ago Anemia due to other cause, not classified   [place], [place], MD       Future Appointments             In 3 weeks Thereasa Solo, [place]   In 1 month Wyn[place] Emery, Dalbert Batman, MD [place] - Patient is not pregnant      Passed - Last BP in normal range    BP Readings from  Last 1 Encounters:  08/22/21 106/74

## 2022-05-30 ENCOUNTER — Other Ambulatory Visit: Payer: Self-pay | Admitting: Family Medicine

## 2022-05-30 DIAGNOSIS — I10 Essential (primary) hypertension: Secondary | ICD-10-CM

## 2022-05-30 NOTE — Telephone Encounter (Signed)
Requested medication (s) are due for refill today:   Yes  Requested medication (s) are on the active medication list:   Yes  Future visit scheduled:   Yes   Last ordered: 05/16/2022 #30, 0 refills  Returned because a 90 day supply is being requested.   Requested Prescriptions  Pending Prescriptions Disp Refills   losartan-hydrochlorothiazide (HYZAAR) 100-25 MG tablet [Pharmacy Med Name: LOSARTAN-HCTZ 100-25 MG TAB] 90 tablet 1    Sig: TAKE 1 TABLET BY MOUTH EVERY DAY     Cardiovascular: ARB + Diuretic Combos Failed - 05/30/2022 12:43 PM      Failed - K in normal range and within 180 days    Potassium  Date Value Ref Range Status  08/22/2021 3.7 3.5 - 5.2 mmol/L Final         Failed - Na in normal range and within 180 days    Sodium  Date Value Ref Range Status  08/22/2021 143 134 - 144 mmol/L Final         Failed - Cr in normal range and within 180 days    Creat  Date Value Ref Range Status  05/20/2016 0.55 0.50 - 1.05 mg/dL Final    Comment:      For patients > or = 65 years of age: The upper reference limit for Creatinine is approximately 13% higher for people identified as African-American.      Creatinine, Ser  Date Value Ref Range Status  08/22/2021 0.97 0.57 - 1.00 mg/dL Final         Failed - eGFR is 10 or above and within 180 days    GFR, Est African American  Date Value Ref Range Status  05/20/2016 >89 >=60 mL/min Final   GFR calc Af Amer  Date Value Ref Range Status  04/19/2020 >60 >60 mL/min Final   GFR, Est Non African American  Date Value Ref Range Status  05/20/2016 >89 >=60 mL/min Final   GFR, Estimated  Date Value Ref Range Status  07/26/2021 48 (L) >60 mL/min Final    Comment:    (NOTE) Calculated using the CKD-EPI Creatinine Equation (2021)    eGFR  Date Value Ref Range Status  08/22/2021 65 >59 mL/min/1.73 Final         Failed - Valid encounter within last 6 months    Recent Outpatient Visits           9 months ago  Hypokalemia   [place], [place], [place]   11 months ago [place], MD   2 years ago Pure hypercholesterolemia   [place], Charlane Ferretti, MD   2 years ago Hypertensive heart disease with chronic combined systolic and diastolic congestive heart failure [facility])   [place], Charlane Ferretti, MD   2 years ago Anemia due to other cause, not classified   [place], [place], MD       Future Appointments             In 1 week Thereasa Solo, Dionne Bucy, PA-C [place]   In 3 weeks Ladell Pier, MD [place] - Patient is not pregnant      Passed - Last BP in normal range    BP Readings  from Last 1 Encounters:  08/22/21 106/74

## 2022-06-06 ENCOUNTER — Ambulatory Visit: Payer: Medicaid Other | Admitting: Physician Assistant

## 2022-06-24 ENCOUNTER — Ambulatory Visit: Payer: Medicaid Other | Admitting: Internal Medicine

## 2022-07-12 ENCOUNTER — Other Ambulatory Visit: Payer: Self-pay | Admitting: Family Medicine

## 2022-07-12 DIAGNOSIS — I10 Essential (primary) hypertension: Secondary | ICD-10-CM

## 2022-07-12 NOTE — Telephone Encounter (Signed)
Requested medication (s) are due for refill today - yes  Requested medication (s) are on the active medication list -yes  Future visit scheduled -no  Last refill: 05/16/22 #30  Notes to clinic: Patient has already had courtesy RF- sent for review   Requested Prescriptions  Pending Prescriptions Disp Refills   losartan-hydrochlorothiazide (HYZAAR) 100-25 MG tablet [Pharmacy Med Name: LOSARTAN-HCTZ 100-25 MG TAB] 30 tablet 0    Sig: TAKE 1 TABLET BY MOUTH EVERY DAY     Cardiovascular: ARB + Diuretic Combos Failed - 07/12/2022  4:14 PM      Failed - K in normal range and within 180 days    Potassium  Date Value Ref Range Status  08/22/2021 3.7 3.5 - 5.2 mmol/L Final         Failed - Na in normal range and within 180 days    Sodium  Date Value Ref Range Status  08/22/2021 143 134 - 144 mmol/L Final         Failed - Cr in normal range and within 180 days    Creat  Date Value Ref Range Status  05/20/2016 0.55 0.50 - 1.05 mg/dL Final    Comment:      For patients > or = 65 years of age: The upper reference limit for Creatinine is approximately 13% higher for people identified as African-American.      Creatinine, Ser  Date Value Ref Range Status  08/22/2021 0.97 0.57 - 1.00 mg/dL Final         Failed - eGFR is 10 or above and within 180 days    GFR, Est African American  Date Value Ref Range Status  05/20/2016 >89 >=60 mL/min Final   GFR calc Af Amer  Date Value Ref Range Status  04/19/2020 >60 >60 mL/min Final   GFR, Est Non African American  Date Value Ref Range Status  05/20/2016 >89 >=60 mL/min Final   GFR, Estimated  Date Value Ref Range Status  07/26/2021 48 (L) >60 mL/min Final    Comment:    (NOTE) Calculated using the CKD-EPI Creatinine Equation (2021)    eGFR  Date Value Ref Range Status  08/22/2021 65 >59 mL/min/1.73 Final         Failed - Valid encounter within last 6 months    Recent Outpatient Visits           10 months ago Hypokalemia    [place], [place], [place]   1 year ago [place], Charlane Ferretti, MD   2 years ago Pure hypercholesterolemia   [place], Charlane Ferretti, MD   2 years ago Hypertensive heart disease with chronic combined systolic and diastolic congestive heart failure [facility])   [place], [place], MD   2 years ago Anemia due to other cause, not classified   [place], [place], MD              Passed - Patient is not pregnant      Passed - Last BP in normal range    BP Readings from Last 1 Encounters:  08/22/21 106/74            Requested Prescriptions  Pending Prescriptions Disp Refills   losartan-hydrochlorothiazide (HYZAAR) 100-25 MG tablet [Pharmacy Med Name: LOSARTAN-HCTZ 100-25 MG TAB] 30 tablet 0    Sig: TAKE 1 TABLET BY  MOUTH EVERY DAY     Cardiovascular: ARB + Diuretic Combos Failed - 07/12/2022  4:14 PM      Failed - K in normal range and within 180 days    Potassium  Date Value Ref Range Status  08/22/2021 3.7 3.5 - 5.2 mmol/L Final         Failed - Na in normal range and within 180 days    Sodium  Date Value Ref Range Status  08/22/2021 143 134 - 144 mmol/L Final         Failed - Cr in normal range and within 180 days    Creat  Date Value Ref Range Status  05/20/2016 0.55 0.50 - 1.05 mg/dL Final    Comment:      For patients > or = 65 years of age: The upper reference limit for Creatinine is approximately 13% higher for people identified as African-American.      Creatinine, Ser  Date Value Ref Range Status  08/22/2021 0.97 0.57 - 1.00 mg/dL Final         Failed - eGFR is 10 or above and within 180 days    GFR, Est African American  Date Value Ref Range Status  05/20/2016 >89 >=60 mL/min Final   GFR calc Af Amer  Date Value Ref Range Status  04/19/2020 >60 >60  mL/min Final   GFR, Est Non African American  Date Value Ref Range Status  05/20/2016 >89 >=60 mL/min Final   GFR, Estimated  Date Value Ref Range Status  07/26/2021 48 (L) >60 mL/min Final    Comment:    (NOTE) Calculated using the CKD-EPI Creatinine Equation (2021)    eGFR  Date Value Ref Range Status  08/22/2021 65 >59 mL/min/1.73 Final         Failed - Valid encounter within last 6 months    Recent Outpatient Visits           10 months ago Hypokalemia   [place], [place], [place]   1 year ago [place], Charlane Ferretti, MD   2 years ago Pure hypercholesterolemia   [place], Charlane Ferretti, MD   2 years ago Hypertensive heart disease with chronic combined systolic and diastolic congestive heart failure [facility])   [place], Charlane Ferretti, MD   2 years ago Anemia due to other cause, not classified   [place], Charlane Ferretti, MD              Passed - Patient is not pregnant      Passed - Last BP in normal range    BP Readings from Last 1 Encounters:  08/22/21 106/74

## 2022-07-30 ENCOUNTER — Ambulatory Visit: Payer: Self-pay

## 2022-07-30 NOTE — Telephone Encounter (Signed)
Noted  

## 2022-07-30 NOTE — Telephone Encounter (Addendum)
  Chief Complaint: right knee pain Symptoms: severe pain Frequency: 3 months Pertinent Negatives: Patient denies swelling or redness or calf pain Disposition: '[]'$ ED /'[]'$ Urgent Care (no appt availability in office) / '[x]'$ Appointment(In office/virtual)/ '[]'$  [place] [facility]/ '[]'$ Home Care/ '[]'$ Refused Recommended Disposition /'[]'$ [place] [facility]/ '[]'$  Follow-up with PCP Additional Notes: Pt given appt as per Vashti Hey ordered her to have- no transportation to go to [facility] [facility] or [facility]- advised to call 911 for worsening pain. Boyfriend Engineering geologist quite domineering during call.  Reason for Disposition  [1] SEVERE pain (e.g., excruciating, unable to walk) AND [2] not improved after 2 hours of pain medicine  Answer Assessment - Initial Assessment Questions 1. LOCATION and RADIATION: "Where is the pain located?"      Right knee- 2. QUALITY: "What does the pain feel like?"  (e.g., sharp, dull, aching, burning)     sharp 3. SEVERITY: "How bad is the pain?" "What does it keep you from doing?"   (Scale 1-10; or mild, moderate, severe)   -  MILD (1-3): doesn't interfere with normal activities    -  MODERATE (4-7): interferes with normal activities (e.g., work or school) or awakens from sleep, limping    -  SEVERE (8-10): excruciating pain, unable to do any normal activities, unable to walk     severe 4. ONSET: "When did the pain start?" "Does it come and go, or is it there all the time?"     3 months 5. RECURRENT: "Have you had this pain before?" If Yes, ask: "When, and what happened then?"    no 6. SETTING: "Has there been any recent work, exercise or other activity that involved that part of the body?"      no 7. AGGRAVATING FACTORS: "What makes the knee pain worse?" (e.g., walking, climbing stairs, running)     Hurts all the time 8. ASSOCIATED SYMPTOMS: "Is there any swelling or redness of the knee?"     no 9. OTHER SYMPTOMS: "Do you have any other symptoms?" (e.g., chest pain, difficulty  breathing, fever, calf pain)     no 10. PREGNANCY: "Is there any chance you are pregnant?" "When was your last menstrual period?"       N/a  Protocols used: Knee Pain-A-AH

## 2022-07-31 ENCOUNTER — Other Ambulatory Visit: Payer: Self-pay | Admitting: Family Medicine

## 2022-07-31 DIAGNOSIS — I10 Essential (primary) hypertension: Secondary | ICD-10-CM

## 2022-08-07 ENCOUNTER — Other Ambulatory Visit: Payer: Self-pay | Admitting: Family Medicine

## 2022-08-07 DIAGNOSIS — E7800 Pure hypercholesterolemia, unspecified: Secondary | ICD-10-CM

## 2022-10-21 ENCOUNTER — Emergency Department (HOSPITAL_COMMUNITY): Payer: 59

## 2022-10-21 ENCOUNTER — Inpatient Hospital Stay (HOSPITAL_COMMUNITY)
Admission: EM | Admit: 2022-10-21 | Discharge: 2022-10-25 | DRG: 377 | Disposition: A | Discharge status: Home Health | Payer: 59 | Attending: Internal Medicine | Admitting: Internal Medicine

## 2022-10-21 ENCOUNTER — Other Ambulatory Visit: Payer: Self-pay

## 2022-10-21 ENCOUNTER — Encounter (HOSPITAL_COMMUNITY): Payer: Self-pay | Admitting: Internal Medicine

## 2022-10-21 DIAGNOSIS — Z96652 Presence of left artificial knee joint: Secondary | ICD-10-CM | POA: Diagnosis not present

## 2022-10-21 DIAGNOSIS — Z1152 Encounter for screening for COVID-19: Secondary | ICD-10-CM | POA: Diagnosis not present

## 2022-10-21 DIAGNOSIS — M8448XA Pathological fracture, other site, initial encounter for fracture: Secondary | ICD-10-CM | POA: Diagnosis not present

## 2022-10-21 DIAGNOSIS — E8809 Other disorders of plasma-protein metabolism, not elsewhere classified: Secondary | ICD-10-CM | POA: Diagnosis not present

## 2022-10-21 DIAGNOSIS — F1010 Alcohol abuse, uncomplicated: Secondary | ICD-10-CM | POA: Diagnosis not present

## 2022-10-21 DIAGNOSIS — R55 Syncope and collapse: Secondary | ICD-10-CM | POA: Diagnosis present

## 2022-10-21 DIAGNOSIS — K860 Alcohol-induced chronic pancreatitis: Secondary | ICD-10-CM | POA: Diagnosis present

## 2022-10-21 DIAGNOSIS — Z7951 Long term (current) use of inhaled steroids: Secondary | ICD-10-CM

## 2022-10-21 DIAGNOSIS — K922 Gastrointestinal hemorrhage, unspecified: Secondary | ICD-10-CM | POA: Diagnosis not present

## 2022-10-21 DIAGNOSIS — E872 Acidosis, unspecified: Secondary | ICD-10-CM | POA: Diagnosis not present

## 2022-10-21 DIAGNOSIS — I7091 Generalized atherosclerosis: Secondary | ICD-10-CM | POA: Diagnosis not present

## 2022-10-21 DIAGNOSIS — Z90710 Acquired absence of both cervix and uterus: Secondary | ICD-10-CM

## 2022-10-21 DIAGNOSIS — R54 Age-related physical debility: Secondary | ICD-10-CM | POA: Diagnosis not present

## 2022-10-21 DIAGNOSIS — E785 Hyperlipidemia, unspecified: Secondary | ICD-10-CM | POA: Diagnosis present

## 2022-10-21 DIAGNOSIS — Z79899 Other long term (current) drug therapy: Secondary | ICD-10-CM

## 2022-10-21 DIAGNOSIS — R944 Abnormal results of kidney function studies: Secondary | ICD-10-CM | POA: Diagnosis present

## 2022-10-21 DIAGNOSIS — Z8719 Personal history of other diseases of the digestive system: Secondary | ICD-10-CM

## 2022-10-21 DIAGNOSIS — E43 Unspecified severe protein-calorie malnutrition: Secondary | ICD-10-CM | POA: Diagnosis not present

## 2022-10-21 DIAGNOSIS — E7800 Pure hypercholesterolemia, unspecified: Secondary | ICD-10-CM | POA: Diagnosis not present

## 2022-10-21 DIAGNOSIS — K264 Chronic or unspecified duodenal ulcer with hemorrhage: Secondary | ICD-10-CM | POA: Diagnosis not present

## 2022-10-21 DIAGNOSIS — E871 Hypo-osmolality and hyponatremia: Secondary | ICD-10-CM | POA: Diagnosis present

## 2022-10-21 DIAGNOSIS — D62 Acute posthemorrhagic anemia: Secondary | ICD-10-CM | POA: Diagnosis not present

## 2022-10-21 DIAGNOSIS — K219 Gastro-esophageal reflux disease without esophagitis: Secondary | ICD-10-CM | POA: Diagnosis present

## 2022-10-21 DIAGNOSIS — M5136 Other intervertebral disc degeneration, lumbar region: Secondary | ICD-10-CM | POA: Diagnosis not present

## 2022-10-21 DIAGNOSIS — K625 Hemorrhage of anus and rectum: Secondary | ICD-10-CM | POA: Diagnosis not present

## 2022-10-21 DIAGNOSIS — J45909 Unspecified asthma, uncomplicated: Secondary | ICD-10-CM | POA: Diagnosis not present

## 2022-10-21 DIAGNOSIS — K269 Duodenal ulcer, unspecified as acute or chronic, without hemorrhage or perforation: Secondary | ICD-10-CM

## 2022-10-21 DIAGNOSIS — T502X5A Adverse effect of carbonic-anhydrase inhibitors, benzothiadiazides and other diuretics, initial encounter: Secondary | ICD-10-CM | POA: Diagnosis present

## 2022-10-21 DIAGNOSIS — E876 Hypokalemia: Secondary | ICD-10-CM | POA: Diagnosis present

## 2022-10-21 DIAGNOSIS — K8681 Exocrine pancreatic insufficiency: Secondary | ICD-10-CM | POA: Diagnosis present

## 2022-10-21 DIAGNOSIS — I110 Hypertensive heart disease with heart failure: Secondary | ICD-10-CM | POA: Diagnosis present

## 2022-10-21 DIAGNOSIS — I5042 Chronic combined systolic (congestive) and diastolic (congestive) heart failure: Secondary | ICD-10-CM | POA: Diagnosis present

## 2022-10-21 DIAGNOSIS — Z86010 Personal history of colonic polyps: Secondary | ICD-10-CM

## 2022-10-21 DIAGNOSIS — M1990 Unspecified osteoarthritis, unspecified site: Secondary | ICD-10-CM | POA: Diagnosis not present

## 2022-10-21 DIAGNOSIS — F1020 Alcohol dependence, uncomplicated: Secondary | ICD-10-CM | POA: Diagnosis present

## 2022-10-21 DIAGNOSIS — K3189 Other diseases of stomach and duodenum: Secondary | ICD-10-CM | POA: Diagnosis not present

## 2022-10-21 DIAGNOSIS — Y900 Blood alcohol level of less than 20 mg/100 ml: Secondary | ICD-10-CM | POA: Diagnosis present

## 2022-10-21 DIAGNOSIS — I10 Essential (primary) hypertension: Secondary | ICD-10-CM | POA: Diagnosis present

## 2022-10-21 DIAGNOSIS — Z6824 Body mass index (BMI) 24.0-24.9, adult: Secondary | ICD-10-CM

## 2022-10-21 LAB — CBC WITH DIFFERENTIAL/PLATELET
Abs Immature Granulocytes: 0.03 10*3/uL (ref 0.00–0.07)
Basophils Absolute: 0 10*3/uL (ref 0.0–0.1)
Basophils Relative: 1 %
Eosinophils Absolute: 0 10*3/uL (ref 0.0–0.5)
Eosinophils Relative: 0 %
HCT: 21.3 % — ABNORMAL LOW (ref 36.0–46.0)
Hemoglobin: 7.5 g/dL — ABNORMAL LOW (ref 12.0–15.0)
Immature Granulocytes: 1 %
Lymphocytes Relative: 28 %
Lymphs Abs: 1.4 10*3/uL (ref 0.7–4.0)
MCH: 34.6 pg — ABNORMAL HIGH (ref 26.0–34.0)
MCHC: 35.2 g/dL (ref 30.0–36.0)
MCV: 98.2 fL (ref 80.0–100.0)
Monocytes Absolute: 0.5 10*3/uL (ref 0.1–1.0)
Monocytes Relative: 9 %
Neutro Abs: 3.1 10*3/uL (ref 1.7–7.7)
Neutrophils Relative %: 61 %
Platelets: 153 10*3/uL (ref 150–400)
RBC: 2.17 MIL/uL — ABNORMAL LOW (ref 3.87–5.11)
RDW: 13.1 % (ref 11.5–15.5)
WBC: 5 10*3/uL (ref 4.0–10.5)
nRBC: 0 % (ref 0.0–0.2)

## 2022-10-21 LAB — HEMOGLOBIN AND HEMATOCRIT, BLOOD
HCT: 24.6 % — ABNORMAL LOW (ref 36.0–46.0)
Hemoglobin: 8.8 g/dL — ABNORMAL LOW (ref 12.0–15.0)

## 2022-10-21 LAB — COMPREHENSIVE METABOLIC PANEL
ALT: 10 U/L (ref 0–44)
AST: 33 U/L (ref 15–41)
Albumin: 2.3 g/dL — ABNORMAL LOW (ref 3.5–5.0)
Alkaline Phosphatase: 50 U/L (ref 38–126)
Anion gap: 12 (ref 5–15)
BUN: 34 mg/dL — ABNORMAL HIGH (ref 8–23)
CO2: 22 mmol/L (ref 22–32)
Calcium: 7.8 mg/dL — ABNORMAL LOW (ref 8.9–10.3)
Chloride: 88 mmol/L — ABNORMAL LOW (ref 98–111)
Creatinine, Ser: 0.93 mg/dL (ref 0.44–1.00)
GFR, Estimated: >60 mL/min (ref >60)
Glucose, Bld: 189 mg/dL — ABNORMAL HIGH (ref 70–99)
Potassium: 2.4 mmol/L — CL (ref 3.5–5.1)
Sodium: 122 mmol/L — ABNORMAL LOW (ref 135–145)
Total Bilirubin: 0.5 mg/dL (ref 0.3–1.2)
Total Protein: 5.1 g/dL — ABNORMAL LOW (ref 6.5–8.1)

## 2022-10-21 LAB — LIPASE, BLOOD: Lipase: 37 U/L (ref 11–51)

## 2022-10-21 LAB — RESP PANEL BY RT-PCR (RSV, FLU A&B, COVID)  RVPGX2
Influenza A by PCR: NEGATIVE
Influenza B by PCR: NEGATIVE
Resp Syncytial Virus by PCR: NEGATIVE
SARS Coronavirus 2 by RT PCR: NEGATIVE

## 2022-10-21 LAB — ETHANOL: Alcohol, Ethyl (B): <10 mg/dL (ref <10)

## 2022-10-21 LAB — MAGNESIUM: Magnesium: 1.1 mg/dL — ABNORMAL LOW (ref 1.7–2.4)

## 2022-10-21 LAB — PROTIME-INR
INR: 1.1 (ref 0.8–1.2)
Prothrombin Time: 14.4 seconds (ref 11.4–15.2)

## 2022-10-21 LAB — LACTIC ACID, PLASMA
Lactic Acid, Venous: 5.3 mmol/L (ref 0.5–1.9)
Lactic Acid, Venous: 3.2 mmol/L (ref 0.5–1.9)

## 2022-10-21 LAB — PHOSPHORUS: Phosphorus: 11.1 mg/dL — ABNORMAL HIGH (ref 2.5–4.6)

## 2022-10-21 LAB — PREPARE RBC (CROSSMATCH)

## 2022-10-21 MED ORDER — LACTATED RINGERS IV BOLUS
1000.0000 mL | Freq: Once | INTRAVENOUS | Status: AC
  Administered 2022-10-21: 1000 mL via INTRAVENOUS

## 2022-10-21 MED ORDER — IOHEXOL 350 MG/ML SOLN
100.0000 mL | Freq: Once | INTRAVENOUS | Status: AC | PRN
  Administered 2022-10-21: 100 mL via INTRAVENOUS

## 2022-10-21 MED ORDER — POTASSIUM CHLORIDE 10 MEQ/100ML IV SOLN
10.0000 meq | INTRAVENOUS | Status: AC
  Administered 2022-10-21 (×2): 10 meq via INTRAVENOUS
  Filled 2022-10-21 (×2): qty 100

## 2022-10-21 MED ORDER — MAGNESIUM SULFATE 2 GM/50ML IV SOLN
2.0000 g | Freq: Once | INTRAVENOUS | Status: AC
  Administered 2022-10-21: 2 g via INTRAVENOUS
  Filled 2022-10-21: qty 50

## 2022-10-21 MED ORDER — PANTOPRAZOLE 80MG IVPB - SIMPLE MED
80.0000 mg | Freq: Once | INTRAVENOUS | Status: AC
  Administered 2022-10-21: 80 mg via INTRAVENOUS
  Filled 2022-10-21: qty 100

## 2022-10-21 MED ORDER — POTASSIUM CHLORIDE IN NACL 40-0.9 MEQ/L-% IV SOLN
INTRAVENOUS | Status: DC
  Filled 2022-10-21 (×2): qty 1000

## 2022-10-21 MED ORDER — POTASSIUM CHLORIDE 10 MEQ/100ML IV SOLN: 10.0000 meq | INTRAVENOUS | Status: AC

## 2022-10-21 MED ORDER — SODIUM CHLORIDE 0.9% IV SOLUTION: Freq: Once | INTRAVENOUS | Status: DC

## 2022-10-21 MED ORDER — PANTOPRAZOLE INFUSION (NEW) - SIMPLE MED
8.0000 mg/h | INTRAVENOUS | Status: AC
  Administered 2022-10-21 – 2022-10-23 (×6): 8 mg/h via INTRAVENOUS
  Filled 2022-10-21 (×8): qty 100

## 2022-10-21 MED ORDER — MAGNESIUM SULFATE 4 GM/100ML IV SOLN
4.0000 g | Freq: Once | INTRAVENOUS | Status: AC
  Administered 2022-10-21: 4 g via INTRAVENOUS
  Filled 2022-10-21: qty 100

## 2022-10-21 NOTE — ED Triage Notes (Signed)
Pt from home via EMS. Husband stated to ems pt sat up in bed and then fainted, went to bathroom and states dark tarry liquid stool. Ems says pt had syncope 2 times while in their care. Hx htn and gi issues. Bs 254. No food appitite in last 24h.. no thinners

## 2022-10-21 NOTE — Consult Note (Addendum)
Consultation Note   Referring Provider:  [facility] PCP: Charlott Rakes, MD Primary Gastroenterologist: Harl Bowie, MD Reason for consultation: anemia, black stool   Hospital Day: 1  Assessment    # 67 yo female with severe anemia ( ? Acute on chronic)  / reported black stool and elevated BUN in setting of recent Aleve.  HGb 7.5, down from 9.9 in December 2022. Baseline hgb unclear. Seems like an upper GI bleed but she reports lower abdominal pain and currently on bedpan passing dark red liquid from rectum. Lower GI bleeding not excluded with certainty   # Electrolyte imbalances with hyponatremia / hypokalemia. Total Ca+ 7.8 ( albumin 2.3). Na+ / K+ repletion in progress.   # History of Etoh abuse. No reported cirrhosis / steatosis on CTA . AST to ALT ratio > 3 but both number within normal range so elevated ratio may be clinically insignificant. Normal INR. Has hypoalbuminemia but could be nutritional.   # Reported weight loss. Cannot quantify. She is 150 pounds, up from 117 in Dec 2022 but no weight available in the interim .   Plan   Eventual EGD, when electrolytes improve. Will need repeat BMET prior to any endoscopic procedures. The risks and benefits of EGD with possible biopsies were discussed with the patient who agrees to proceed.  NPO for now. If not going to do EGD today will give clear liquids later.  Continue PPI Infusion Continue to monitor hgb and transfuse as needed Need CIWA? H+P by TR[facility] in progress  HPI   Patient is a 67 y.o. year old female with a past medical history of adenomatous colon polyps, diverticulosis, hypertension, hyperlipidemia, asthma, Etoh abuse, GERD, bilateral knee osteoarthritis  CHF (EF 45 to 50%)     See PMH for any additional medical problems.  Patient presented to ED early this am via EMS for evaluation of black stool and syncope. She was mildly tachycardic but otherwise  hemodynamically stable.She reports black stool for the last week. She has taken pepto bismol but stools were already black. No upper abdominal pain or N/V but she has been having lower abdominal pain over the last week. Pain gets worse with eating. CTA negative for gi bleeding. Celiac / SMA / IMA patent without narrowing.or limiting stenosis, mild pneumobilia present. Labs notable for hgb of 7.5, BUN 34, sodium 122, K+ 2.4, Cl- 88, albumin 2.3.   positive for for mild pneumobilia. Has gotten a unit of blood, another unit transfusing, Got IV runs of K+ this am and getting K+ in IVF. PPI infusion in progress.  EKG Date/Time:                  Monday October 21 2022 03:32:39 EST Ventricular Rate:         84 PR Interval:                 168 QRS Duration: 122 QT Interval:                 424 QTC Calculation:        502 R Axis:  41 Text Interpretation:      Sinus rhythm Interpretation limited secondary to artifact Confirmed by Ripley Fraise [phone]) on 10/21/2022 4:27:47 AM   [place] S[place] consumes about 80 oz of beer a day. She drinks also drinks liquor about three times a week. This has been her pattern of Etoh intake for a long time.    Previous GI Evaluation:  May 2017 colonoscopy   Surgical [P], sigmoid, transverse, and cecum, polyps(3) - TUBULAR ADENOMA (X6 FRAGMENTS). - NO HIGH GRADE DYSPLASIA OR MALIGNANCY.  Recent Labs and Imaging CT ANGIO GI BLEED  Result Date: 10/21/2022 CLINICAL DATA:  Hemorrhage from the rectum. EXAM: CTA ABDOMEN AND PELVIS WITHOUT AND WITH CONTRAST TECHNIQUE: Multidetector CT imaging of the abdomen and pelvis was performed using the standard protocol during bolus administration of intravenous contrast. Multiplanar reconstructed images and MIPs were obtained and reviewed to evaluate the vascular anatomy. RADIATION DOSE REDUCTION: This exam was performed according to the departmental dose-optimization program which includes automated exposure control,  adjustment of the mA and/or kV according to patient size and/or use of iterative reconstruction technique. CONTRAST:  175m OMNIPAQUE IOHEXOL 350 MG/ML SOLN COMPARISON:  05/22/2021 FINDINGS: VASCULAR Suboptimal arterial opacification due to timing or interruption of contrast based on right heart opacification. Aorta: Atheromatous plaque.  No aneurysm or dissection Celiac: No branch occlusion, beading, or narrowing. SMA: No emergent finding or flow limiting stenosis Renals: Symmetric enhancement without evidence of stenosis IMA: Patent Inflow: Scattered atheromatous calcified plaques. Proximal Outflow: Negative Veins: Negative Review of the MIP images confirms the above findings. NON-VASCULAR Lower chest: Scarring or atelectasis in the left lower lung. Coronary atherosclerosis. Hepatobiliary: No focal liver abnormality. Small volume Litt tubular gas at the left lobe liver, likely pneumobilia in usually related to prior intervention. No gas seen within the portal venous system. No evidence of biliary obstruction or stone. Pancreas: Unremarkable. Spleen: Unremarkable. Adrenals/Urinary Tract: Negative adrenals. No hydronephrosis or stone. Unremarkable bladder. Stomach/Bowel: No evidence of active intraluminal bleeding. No bowel obstruction or inflammation. Lymphatic: No mass or adenopathy. Reproductive:Symmetric appearance of the adnexa.  Hysterectomy. Other: No ascites or pneumoperitoneum. Musculoskeletal: No acute abnormalities. Metallic foreign body at the right iliac fossa. Lumbar spine degeneration with L2 and L3 chronic superior endplate fractures. IMPRESSION: VASCULAR 1. No localized source of bleeding.  No sign of active GI bleeding. 2. Generalized atherosclerosis. NON-VASCULAR Mild pneumobilia, correlate for sphincterotomy history. Otherwise stable exam since 2022. Electronically Signed   By: JJorje GuildM.D.   On: 10/21/2022 06:16   DG Chest Portable 1 View  Result Date: 10/21/2022 CLINICAL DATA:   aloc.  syncope EXAM: PORTABLE CHEST 1 VIEW COMPARISON:  Chest x-ray 04/16/2020, CT angio chest 07/13/2019 FINDINGS: The heart and mediastinal contours are within normal limits. Aortic calcification. No focal consolidation. Bibasilar interstitial markings. No pleural effusion. No pneumothorax. No acute osseous abnormality. IMPRESSION: 1. Bibasilar interstitial markings. Query underlying infection or inflammation. 2.  Aortic Atherosclerosis (ICD10-I70.0). Electronically Signed   By: MIven FinnM.D.   On: 10/21/2022 03:59    Labs:  Recent Labs    10/21/22 0333  WBC 5.0  HGB 7.5*  HCT 21.3*  PLT 153   Recent Labs    10/21/22 0333  NA 122*  K 2.4*  CL 88*  CO2 22  GLUCOSE 189*  BUN 34*  CREATININE 0.93  CALCIUM 7.8*   Recent Labs    10/21/22 0333  PROT 5.1*  ALBUMIN 2.3*  AST 33  ALT 10  ALKPHOS 50  BILITOT 0.5  No results for input(s): "HEPBSAG", "HCVAB", "HEPAIGM", "HEPBIGM" in the last 72 hours. Recent Labs    10/21/22 0333  LABPROT 14.4  INR 1.1    Past Medical History:  Diagnosis Date   Arthritis    Asthma    last year in December   Dyspnea    GERD (gastroesophageal reflux disease)    High cholesterol    Hypertension    Pneumonia 01/2017    Past Surgical History:  Procedure Laterality Date   ABDOMINAL HYSTERECTOMY     FEMUR IM NAIL Left 04/17/2020   Procedure: INTRAMEDULLARY (IM) RETROGRADE FEMORAL NAILING;  Surgeon: Meredith Pel, MD;  Location: [facility];  Service: Orthopedics;  Laterality: Left;   nasal polyps     removed just this past tuesday at [facility] facility over by [place] Left 10/01/2016   Procedure: LEFT TOTAL KNEE ARTHROPLASTY;  Surgeon: Meredith Pel, MD;  Location: [facility];  Service: Orthopedics;  Laterality: Left;    Family History  Problem Relation Age of Onset   Diabetes Sister    Healthy Mother    Healthy Father    Colon cancer Neg Hx    Breast cancer Neg Hx     Prior to Admission  medications   Medication Sig Start Date End Date Taking? Authorizing Provider  atorvastatin (LIPITOR) 20 MG tablet TAKE 1 TABLET BY MOUTH DAILY AT 6PM Patient taking differently: Take 20 mg by mouth daily. 08/07/22  Yes Newlin, Enobong, MD  fluticasone-salmeterol (WIXELA INHUB) 500-50 MCG/ACT AEPB INHALE 1 PUFF INTO THE LUNGS IN THE MORNING AND AT BEDTIME. Patient taking differently: Inhale 1 puff into the lungs in the morning and at bedtime. 05/08/22  Yes Newlin, Charlane Ferretti, MD  losartan-hydrochlorothiazide (HYZAAR) 100-25 MG tablet TAKE 1 TABLET BY MOUTH EVERY DAY 07/31/22  Yes Newlin, Enobong, MD  PROAIR HFA 108 (90 Base) MCG/ACT inhaler INHALE 2 PUFFS INTO THE LUNGS EVERY 6 (SIX) HOURS AS NEEDED FOR WHEEZING OR SHORTNESS OF BREATH. 06/11/21 10/22/23 Yes Newlin, Enobong, MD  albuterol (PROVENTIL) (2.5 MG/3ML) 0.083% nebulizer solution Take 3 mLs (2.5 mg total) by nebulization every 6 (six) hours as needed for wheezing or shortness of breath. Patient not taking: Reported on 10/21/2022 08/16/20   Charlott Rakes, MD  traZODone (DESYREL) 50 MG tablet Take 1 tablet (50 mg total) by mouth at bedtime as needed for sleep. 07/21/19 09/16/19  Aline August, MD    Current Facility-Administered Medications  Medication Dose Route Frequency Provider Last Rate Last Admin   0.9 %  sodium chloride infusion (Manually program via Guardrails IV Fluids)   Intravenous Once Sponseller, Rebekah R, PA-C       0.9 % NaCl with KCl 40 mEq / L  infusion   Intravenous Continuous Reubin Milan, MD       pantoprozole (PROTONIX) 80 mg /NS 100 mL infusion  8 mg/hr Intravenous Continuous Sponseller, Rebekah R, PA-C   Stopped at 10/21/22 [id number]   potassium chloride 10 mEq in 100 mL IVPB  10 mEq Intravenous Q1 Hr x 3 Reubin Milan, MD       Current Outpatient Medications  Medication Sig Dispense Refill   atorvastatin (LIPITOR) 20 MG tablet TAKE 1 TABLET BY MOUTH DAILY AT 6PM (Patient taking differently: Take 20 mg by mouth  daily.) 90 tablet 0   fluticasone-salmeterol (WIXELA INHUB) 500-50 MCG/ACT AEPB INHALE 1 PUFF INTO THE LUNGS IN THE MORNING AND AT BEDTIME. (Patient taking differently: Inhale 1 puff into the lungs  in the morning and at bedtime.) 60 each 1   losartan-hydrochlorothiazide (HYZAAR) 100-25 MG tablet TAKE 1 TABLET BY MOUTH EVERY DAY 90 tablet 0   PROAIR HFA 108 (90 Base) MCG/ACT inhaler INHALE 2 PUFFS INTO THE LUNGS EVERY 6 (SIX) HOURS AS NEEDED FOR WHEEZING OR SHORTNESS OF BREATH. 8.5 g 5   albuterol (PROVENTIL) (2.5 MG/3ML) 0.083% nebulizer solution Take 3 mLs (2.5 mg total) by nebulization every 6 (six) hours as needed for wheezing or shortness of breath. (Patient not taking: Reported on 10/21/2022) 75 mL 3    Allergies as of 10/21/2022   (No Known Allergies)    Social History   Socioeconomic History   Marital status: Single    Spouse name: Not on file   Number of children: Not on file   Years of education: Not on file   Highest education level: Not on file  Occupational History   Not on file  Tobacco Use   Smoking status: Never   Smokeless tobacco: Never  Vaping Use   Vaping Use: Never used  Substance and Sexual Activity   Alcohol use: Yes    Alcohol/week: 1.0 standard drink of alcohol    Types: 1 Shots of liquor per week    Comment: socially   Drug use: No   Sexual activity: Not on file  Other Topics Concern   Not on file  Social History Narrative   Not on file   Social Determinants of Health   Financial Resource Strain: Not on file  Food Insecurity: Not on file  Transportation Needs: Not on file  Physical Activity: Not on file  Stress: Not on file  Social Connections: Not on file  Intimate Partner Violence: Not on file    Review of Systems: All systems reviewed and negative except where noted in HPI.  Physical Exam: Vital signs in last 24 hours: Temp:  [97.3 F (36.3 C)-98 F (36.7 C)] 97.9 F (36.6 C) (02/19 0852) Pulse Rate:  [82-104] 104 (02/19  0852) Resp:  [18-21] 20 (02/19 0852) BP: (93-126)/(59-97) 120/97 (02/19 0852) SpO2:  [94 %-100 %] 96 % (02/19 0852) Weight:  [68 kg] 68 kg (02/19 0328)    General:  Alert female in NAD Psych:  Pleasant, cooperative. Normal mood and affect Eyes: Pupils equal Ears:  Normal auditory acuity Nose: No deformity, discharge or lesions Neck:  Supple, no masses felt Lungs:  Clear to auscultation.  Heart:  Regular rate, regular rhythm.  Abdomen:  Soft, nondistended, nontender, active bowel sounds, no masses felt Rectal :  Deferred. On bedpan. Dried dark red blood in perineal area. Dark red liquid in bedpan.  Msk: Symmetrical without gross deformities.  Neurologic:  Alert, oriented, grossly normal neurologically Extremities : No edema Skin:  Intact without significant lesions.    Intake/Output from previous day: No intake/output data recorded. Intake/Output this shift:  No intake/output data recorded.    Principal Problem:   Acute GI bleeding Active Problems:   Hyperlipidemia   Essential hypertension   GERD   Hypokalemia   GI bleed   Protein-calorie malnutrition, severe (HCC)   Acute on chronic blood loss anemia   Hyponatremia    Tye Savoy, NP-C @  10/21/2022, 9:15 AM

## 2022-10-21 NOTE — H&P (Signed)
History and Physical    Patient: Angel Hebert [id number] DOB: [birth year] DOA: 10/21/2022 DOS: the patient was seen and examined on 10/21/2022 PCP: Charlott Rakes, MD  Patient coming from: Home  Chief Complaint:  Chief Complaint  Patient presents with   Melena   HPI: Angel Hebert is a 67 y.o. female with medical history significant of alcohol abuse, alcohol induced pancreatitis, osteoarthritis, asthma, allergic rhinitis, CAP, chronic pansinusitis, dyspnea, GERD, history of GI bleed, iron deficiency anemia, hyperlipidemia, hypertension, pneumonia who presented to the emergency department with complaints of syncopal episodes in the setting of acute on chronic blood loss anemia secondary to GI bleed with melena associated with epigastric abdominal pain.  According to her husband she drinks beer daily.  She has been using over-the-counter naproxen recently. He denied fever, chills, rhinorrhea, sore throat, wheezing or hemoptysis.  No chest pain, palpitations, diaphoresis, PND, orthopnea or pitting edema of the lower extremities.  No abdominal pain, nausea, emesis, diarrhea, constipation, melena or hematochezia.  No flank pain, dysuria, frequency or hematuria.  No polyuria, polydipsia, polyphagia or blurred vision.   Lab work: CBC showed a white count 5.0, hemoglobin 7.5 g/dL platelets 153.  Normal PT and INR.  Normal lipase and alcohol level.  Magnesium was 1.1 and phosphorus 11.1 mg/dL.  Lactic acid 5.3 then 3.2 mmol/L.  Sodium 122, potassium 3.4, chloride 88 and CO2 22 mmol/L.  Glucose 189, BUN 34 and creatinine 0.93 mg/dL.  Calcium was normal after correction.  Total protein 5.1 and albumin 2.3 g/dL.  The rest of the LFTs were normal.  Imaging: Portable chest radiograph showed bibasilar interstitial markings and aortic atherosclerosis.  CTA GI bleed with no localized source of bleeding.  Generalized atherosclerosis.  There is mild pneumobilia.  ED course: Initial vital signs were  temperature 97.3 F, pulse 82, respirations 21, BP 126/86 mmHg O2 sat 94% on room air.  The patient received 2000 mL of normal saline bolus and was started on a pantoprazole infusion.  2 units of PRBC ordered.  Magnesium sulfate supplementation also ordered.   Review of Systems: As mentioned in the history of present illness. All other systems reviewed and are negative. Past Medical History:  Diagnosis Date   Arthritis    Asthma    last year in December   Dyspnea    GERD (gastroesophageal reflux disease)    High cholesterol    Hypertension    Pneumonia 01/2017   Past Surgical History:  Procedure Laterality Date   ABDOMINAL HYSTERECTOMY     FEMUR IM NAIL Left 04/17/2020   Procedure: INTRAMEDULLARY (IM) RETROGRADE FEMORAL NAILING;  Surgeon: Meredith Pel, MD;  Location: [facility];  Service: Orthopedics;  Laterality: Left;   nasal polyps     removed just this past tuesday at [facility] facility over by [facility] Left 10/01/2016   Procedure: LEFT TOTAL KNEE ARTHROPLASTY;  Surgeon: Meredith Pel, MD;  Location: [facility];  Service: Orthopedics;  Laterality: Left;   Social History:  reports that she has never smoked. She has never used smokeless tobacco. She reports current alcohol use of about 1.0 standard drink of alcohol per week. She reports that she does not use drugs.  No Known Allergies  Family History  Problem Relation Age of Onset   Diabetes Sister    Healthy Mother    Healthy Father    Colon cancer Neg Hx    Breast cancer Neg Hx     Prior to Admission medications  Medication Sig Start Date End Date Taking? Authorizing Provider  atorvastatin (LIPITOR) 20 MG tablet TAKE 1 TABLET BY MOUTH DAILY AT 6PM Patient taking differently: Take 20 mg by mouth daily. 08/07/22  Yes Newlin, Enobong, MD  fluticasone-salmeterol (WIXELA INHUB) 500-50 MCG/ACT AEPB INHALE 1 PUFF INTO THE LUNGS IN THE MORNING AND AT BEDTIME. Patient taking differently: Inhale 1 puff  into the lungs in the morning and at bedtime. 05/08/22  Yes Newlin, Charlane Ferretti, MD  losartan-hydrochlorothiazide (HYZAAR) 100-25 MG tablet TAKE 1 TABLET BY MOUTH EVERY DAY 07/31/22  Yes Newlin, Enobong, MD  PROAIR HFA 108 (90 Base) MCG/ACT inhaler INHALE 2 PUFFS INTO THE LUNGS EVERY 6 (SIX) HOURS AS NEEDED FOR WHEEZING OR SHORTNESS OF BREATH. 06/11/21 10/22/23 Yes Newlin, Enobong, MD  albuterol (PROVENTIL) (2.5 MG/3ML) 0.083% nebulizer solution Take 3 mLs (2.5 mg total) by nebulization every 6 (six) hours as needed for wheezing or shortness of breath. Patient not taking: Reported on 10/21/2022 08/16/20   Charlott Rakes, MD  traZODone (DESYREL) 50 MG tablet Take 1 tablet (50 mg total) by mouth at bedtime as needed for sleep. 07/21/19 09/16/19  Aline August, MD    Physical Exam: Vitals:   10/21/22 0326 10/21/22 0328 10/21/22 0646 10/21/22 0700  BP: 126/86  (!) 101/59 93/62  Pulse: 82  (!) 101   Resp: (!) 21  19   Temp: (!) 97.3 F (36.3 C)  97.8 F (36.6 C)   TempSrc: Oral  Oral   SpO2: 94%     Weight:  68 kg    Height:  5' 6"$  (1.676 m)     Physical Exam Vitals and nursing note reviewed.  Constitutional:      General: She is awake.     Appearance: Normal appearance. She is ill-appearing.  HENT:     Head: Normocephalic.     Nose: No rhinorrhea.     Mouth/Throat:     Mouth: Mucous membranes are moist.  Eyes:     General: No scleral icterus.    Pupils: Pupils are equal, round, and reactive to light.  Neck:     Vascular: No JVD.  Cardiovascular:     Rate and Rhythm: Normal rate and regular rhythm.     Heart sounds: S1 normal and S2 normal.  Pulmonary:     Effort: Pulmonary effort is normal.     Breath sounds: Normal breath sounds. No wheezing, rhonchi or rales.  Abdominal:     General: Bowel sounds are normal. There is no distension.     Palpations: Abdomen is soft.     Tenderness: There is abdominal tenderness. There is no guarding.  Musculoskeletal:     Cervical back: Neck  supple.     Right lower leg: No edema.     Left lower leg: No edema.  Skin:    General: Skin is warm and dry.  Neurological:     General: No focal deficit present.     Mental Status: She is alert and oriented to person, place, and time.  Psychiatric:        Mood and Affect: Mood normal.        Behavior: Behavior normal. Behavior is cooperative.     Data Reviewed:  Results are pending, will review when available.  Assessment and Plan: Principal Problem:   Acute on chronic blood loss anemia Secondary to:   Acute GI bleeding Admit to stepdown/inpatient. Keep NPO for now. Continue IV fluids. Continue pantoprazole infusion. Monitor H&H. Transfuse further as needed. GI consult  greatly appreciated.  Active Problems:   Hypokalemia Replacing. Magnesium supplemented. Follow potassium level.    Hyponatremia Likely due to HCTZ use/beer [place] mania Hold hydrochlorothiazide for now. Continue normal saline infusion. Follow-up sodium level.    Hypomagnesemia Due to alcoholism. Replacement ordered. Follow level as needed.    Hyperphosphatemia Continue IV fluids. Recheck phosphorus in AM.    Hyperlipidemia Hold atorvastatin for now.    Essential hypertension Hold antihypertensives.    GERD On PPI infusion.    Protein-calorie malnutrition, severe ([facility]) In the setting of alcoholism and acute blood loss anemia. Alcohol cessation advised. Protein supplementation as needed. Follow-up total protein and albumin level.    Advance Care Planning:   Code Status: Full Code   Consults: [facility].  Family Communication:   Severity of Illness: The appropriate patient status for this patient is INPATIENT. Inpatient status is judged to be reasonable and necessary in order to provide the required intensity of service to ensure the patient's safety. The patient's presenting symptoms, physical exam findings, and initial radiographic and laboratory data in the  context of their chronic comorbidities is felt to place them at high risk for further clinical deterioration. Furthermore, it is not anticipated that the patient will be medically stable for discharge from the hospital within 2 midnights of admission.   * I certify that at the point of admission it is my clinical judgment that the patient will require inpatient hospital care spanning beyond 2 midnights from the point of admission due to high intensity of service, high risk for further deterioration and high frequency of surveillance required.*  Author: Reubin Milan, MD 10/21/2022 7:13 AM  For on call review [email or web address].   This document was prepared using Dragon voice recognition software and may contain some unintended transcription errors.

## 2022-10-21 NOTE — ED Notes (Signed)
Provider told this RN to hold off on potassium for now.

## 2022-10-21 NOTE — ED Notes (Signed)
ED TO INPATIENT HANDOFF REPORT  ED Nurse Name and Phone #:  [phone]  S Name/Age/Gender [name] 67 y.o. female Room/Bed: 003C/003C  Code Status   Code Status: Full Code  Home/SNF/Other Home Patient oriented to: self, place, time, and situation Is this baseline? Yes   Triage Complete: Triage complete  Chief Complaint Acute GI bleeding [K92.2] GI bleed [K92.2]  Triage Note Pt from home via EMS. Husband stated to ems pt sat up in bed and then fainted, went to bathroom and states dark tarry liquid stool. Ems says pt had syncope 2 times while in their care. Hx htn and gi issues. Bs 254. No food appitite in last 24h.. no thinners   Allergies No Known Allergies  Level of Care/Admitting Diagnosis ED Disposition     ED Disposition  Admit   Condition  --   Comment  Hospital Area: [facility] [[id number]]  Level of Care: Progressive [102]  Admit to Progressive based on following criteria: GI, ENDOCRINE disease patients with GI bleeding, acute liver failure or pancreatitis, stable with diabetic ketoacidosis or thyrotoxicosis (hypothyroid) state.  May admit patient to [facility] or [facility] if equivalent level of care is available:: No  Covid Evaluation: Asymptomatic - no recent exposure (last 10 days) testing not required  Diagnosis: GI bleed [id number]  Admitting Physician: Reubin Milan [id number]  Attending Physician: Reubin Milan [id number]  Certification:: I certify this patient will need inpatient services for at least 2 midnights  Estimated Length of Stay: 2          B Medical/Surgery History Past Medical History:  Diagnosis Date   Arthritis    Asthma    last year in December   Dyspnea    GERD (gastroesophageal reflux disease)    High cholesterol    Hypertension    Pneumonia 01/2017   Past Surgical History:  Procedure Laterality Date   ABDOMINAL HYSTERECTOMY     FEMUR IM NAIL Left 04/17/2020   Procedure:  INTRAMEDULLARY (IM) RETROGRADE FEMORAL NAILING;  Surgeon: Meredith Pel, MD;  Location: [facility];  Service: Orthopedics;  Laterality: Left;   nasal polyps     removed just this past tuesday at [facility] facility over by [facility] Left 10/01/2016   Procedure: LEFT TOTAL KNEE ARTHROPLASTY;  Surgeon: Meredith Pel, MD;  Location: [facility];  Service: Orthopedics;  Laterality: Left;     A IV Location/Drains/Wounds Patient Lines/Drains/Airways Status     Active Line/Drains/Airways     Name Placement date Placement time Site Days   Peripheral IV 10/21/22 20 G Anterior;Proximal;Right Forearm 10/21/22  0919  Forearm  less than 1   Peripheral IV 10/21/22 20 G Posterior;Right Forearm 10/21/22  1010  Forearm  less than 1   Incision (Closed) 04/17/20 Leg Left 04/17/20  1830  -- 917            Intake/Output Last 24 hours No intake or output data in the 24 hours ending 10/21/22 1328  Labs/Imaging Results for orders placed or performed during the hospital encounter of 10/21/22 (from the past 48 hour(s))  Comprehensive metabolic panel     Status: Abnormal   Collection Time: 10/21/22  3:33 AM  Result Value Ref Range   Sodium 122 (L) 135 - 145 mmol/L   Potassium 2.4 (LL) 3.5 - 5.1 mmol/L    Comment: CRITICAL RESULT CALLED TO, READ BACK BY AND VERIFIED WITH ERIC TURNBOW RN 10/21/22 [place]  Chloride 88 (L) 98 - 111 mmol/L   CO2 22 22 - 32 mmol/L   Glucose, Bld 189 (H) 70 - 99 mg/dL    Comment: Glucose reference range applies only to samples taken after fasting for at least 8 hours.   BUN 34 (H) 8 - 23 mg/dL   Creatinine, Ser 0.93 0.44 - 1.00 mg/dL   Calcium 7.8 (L) 8.9 - 10.3 mg/dL   Total Protein 5.1 (L) 6.5 - 8.1 g/dL   Albumin 2.3 (L) 3.5 - 5.0 g/dL   AST 33 15 - 41 U/L   ALT 10 0 - 44 U/L   Alkaline Phosphatase 50 38 - 126 U/L   Total Bilirubin 0.5 0.3 - 1.2 mg/dL   GFR, Estimated >60 >60 mL/min    Comment: (NOTE) Calculated using the CKD-EPI  Creatinine Equation (2021)    Anion gap 12 5 - 15    Comment: Performed at [facility], [place] [address] [place], [place] [address]  CBC with Differential/Platelet     Status: Abnormal   Collection Time: 10/21/22  3:33 AM  Result Value Ref Range   WBC 5.0 4.0 - 10.5 K/uL   RBC 2.17 (L) 3.87 - 5.11 MIL/uL   Hemoglobin 7.5 (L) 12.0 - 15.0 g/dL   HCT 21.3 (L) 36.0 - 46.0 %   MCV 98.2 80.0 - 100.0 fL   MCH 34.6 (H) 26.0 - 34.0 pg   MCHC 35.2 30.0 - 36.0 g/dL   RDW 13.1 11.5 - 15.5 %   Platelets 153 150 - 400 K/uL   nRBC 0.0 0.0 - 0.2 %   Neutrophils Relative % 61 %   Neutro Abs 3.1 1.7 - 7.7 K/uL   Lymphocytes Relative 28 %   Lymphs Abs 1.4 0.7 - 4.0 K/uL   Monocytes Relative 9 %   Monocytes Absolute 0.5 0.1 - 1.0 K/uL   Eosinophils Relative 0 %   Eosinophils Absolute 0.0 0.0 - 0.5 K/uL   Basophils Relative 1 %   Basophils Absolute 0.0 0.0 - 0.1 K/uL   Immature Granulocytes 1 %   Abs Immature Granulocytes 0.03 0.00 - 0.07 K/uL    Comment: Performed at [facility] [address] [place], [place] [address]  Lactic acid, plasma     Status: Abnormal   Collection Time: 10/21/22  3:33 AM  Result Value Ref Range   Lactic Acid, Venous 5.3 (HH) 0.5 - 1.9 mmol/L    Comment: CRITICAL RESULT CALLED TO, READ BACK BY AND VERIFIED WITH ERIC TURNBOW RN 10/21/22 0429 Wiliam Ke Performed at [facility], [place] [address], [place], [place] [address]   Protime-INR     Status: None   Collection Time: 10/21/22  3:33 AM  Result Value Ref Range   Prothrombin Time 14.4 11.4 - 15.2 seconds   INR 1.1 0.8 - 1.2    Comment: (NOTE) INR goal varies based on device and disease states. Performed at [facility], [place] [address], [place], [place] [address]   Lipase, blood     Status: None   Collection Time: 10/21/22  3:33 AM  Result Value Ref Range   Lipase 37 11 - 51 U/L    Comment: Performed at [facility], [place] [address] [place], [place] [address]  Type and screen  Ordered by PROVIDER DEFAULT     Status: None (Preliminary result)   Collection Time: 10/21/22  4:12 AM  Result Value Ref Range   ABO/RH(D)  B POS    Antibody Screen NEG    Sample Expiration 10/24/2022,2359    Unit Number [id number]    Blood Component Type RED CELLS,LR    Unit division 00    Status of Unit ISSUED    Transfusion Status OK TO TRANSFUSE    Crossmatch Result      Compatible Performed at [facility], [address] [place], [place] [address]    Unit Number [id number]    Blood Component Type RED CELLS,LR    Unit division 00    Status of Unit ISSUED    Transfusion Status OK TO TRANSFUSE    Crossmatch Result Compatible   Ethanol     Status: None   Collection Time: 10/21/22  4:18 AM  Result Value Ref Range   Alcohol, Ethyl (B) <10 <10 mg/dL    Comment: (NOTE) Lowest detectable limit for serum alcohol is 10 mg/dL.  For medical purposes only. Performed at [facility], [place] [address], [place], [place] [address]   Prepare RBC (crossmatch)     Status: None   Collection Time: 10/21/22  4:28 AM  Result Value Ref Range   Order Confirmation      ORDER PROCESSED BY BLOOD BANK Performed at [facility], [place] [address], [place], [place] [address]   Resp panel by RT-PCR (RSV, Flu A&B, Covid) Anterior Nasal Swab     Status: None   Collection Time: 10/21/22  5:15 AM   Specimen: Anterior Nasal Swab  Result Value Ref Range   SARS Coronavirus 2 by RT PCR NEGATIVE NEGATIVE   Influenza A by PCR NEGATIVE NEGATIVE   Influenza B by PCR NEGATIVE NEGATIVE    Comment: (NOTE) The Xpert Xpress SARS-CoV-2/FLU/RSV plus assay is intended as an aid in the diagnosis of influenza from Nasopharyngeal swab specimens and should not be used as a sole basis for treatment. Nasal washings and aspirates are unacceptable for Xpert Xpress SARS-CoV-2/FLU/RSV testing.  Fact Sheet for Patients: EntrepreneurPulse.com.au  Fact Sheet for Healthcare  Providers: IncredibleEmployment.be  This test is not yet approved or cleared by the [place] FDA and has been authorized for detection and/or diagnosis of SARS-CoV-2 by FDA under an Emergency Use Authorization (EUA). This EUA will remain in effect (meaning this test can be used) for the duration of the COVID-19 declaration under Section 564(b)(1) of the Act, 21 U.S.C. section 360bbb-3(b)(1), unless the authorization is terminated or revoked.     Resp Syncytial Virus by PCR NEGATIVE NEGATIVE    Comment: (NOTE) Fact Sheet for Patients: EntrepreneurPulse.com.au  Fact Sheet for Healthcare Providers: IncredibleEmployment.be  This test is not yet approved or cleared by the [place] FDA and has been authorized for detection and/or diagnosis of SARS-CoV-2 by FDA under an Emergency Use Authorization (EUA). This EUA will remain in effect (meaning this test can be used) for the duration of the COVID-19 declaration under Section 564(b)(1) of the Act, 21 U.S.C. section 360bbb-3(b)(1), unless the authorization is terminated or revoked.  Performed at [facility], [place] [address] [place], [place] [address]   Lactic acid, plasma     Status: Abnormal   Collection Time: 10/21/22  7:19 AM  Result Value Ref Range   Lactic Acid, Venous 3.2 (HH) 0.5 - 1.9 mmol/L    Comment: CRITICAL VALUE NOTED. VALUE IS CONSISTENT WITH PREVIOUSLY REPORTED/CALLED VALUE Performed at [facility], [place] [address] [place],  [address]   Magnesium     Status:  Abnormal   Collection Time: 10/21/22 10:25 AM  Result Value Ref Range   Magnesium 1.1 (L) 1.7 - 2.4 mg/dL    Comment: Performed at [facility] [address] [place], [place] [address]  Phosphorus     Status: Abnormal   Collection Time: 10/21/22 10:25 AM  Result Value Ref Range   Phosphorus 11.1 (H) 2.5 - 4.6 mg/dL    Comment: Performed at [facility] [address] [place], [place] [address]  Hemoglobin and hematocrit, blood     Status: Abnormal   Collection Time: 10/21/22 10:30 AM  Result Value Ref Range   Hemoglobin 8.8 (L) 12.0 - 15.0 g/dL   HCT 24.6 (L) 36.0 - 46.0 %    Comment: Performed at [facility], [place] [address] [place], [place] [address]   CT ANGIO GI BLEED  Result Date: 10/21/2022 CLINICAL DATA:  Hemorrhage from the rectum. EXAM: CTA ABDOMEN AND PELVIS WITHOUT AND WITH CONTRAST TECHNIQUE: Multidetector CT imaging of the abdomen and pelvis was performed using the standard protocol during bolus administration of intravenous contrast. Multiplanar reconstructed images and MIPs were obtained and reviewed to evaluate the vascular anatomy. RADIATION DOSE REDUCTION: This exam was performed according to the departmental dose-optimization program which includes automated exposure control, adjustment of the mA and/or kV according to patient size and/or use of iterative reconstruction technique. CONTRAST:  157m OMNIPAQUE IOHEXOL 350 MG/ML SOLN COMPARISON:  05/22/2021 FINDINGS: VASCULAR Suboptimal arterial opacification due to timing or interruption of contrast based on right heart opacification. Aorta: Atheromatous plaque.  No aneurysm or dissection Celiac: No branch occlusion, beading, or narrowing. SMA: No emergent finding or flow limiting stenosis Renals: Symmetric enhancement without evidence of stenosis IMA: Patent Inflow: Scattered atheromatous calcified plaques. Proximal Outflow: Negative Veins: Negative Review of the MIP images confirms the above findings. NON-VASCULAR Lower chest: Scarring or atelectasis in the left lower lung. Coronary atherosclerosis. Hepatobiliary: No focal liver abnormality. Small volume Litt tubular gas at the left lobe liver, likely pneumobilia in usually related to prior intervention. No gas seen within the portal venous system. No evidence of biliary obstruction or stone. Pancreas: Unremarkable. Spleen: Unremarkable.  Adrenals/Urinary Tract: Negative adrenals. No hydronephrosis or stone. Unremarkable bladder. Stomach/Bowel: No evidence of active intraluminal bleeding. No bowel obstruction or inflammation. Lymphatic: No mass or adenopathy. Reproductive:Symmetric appearance of the adnexa.  Hysterectomy. Other: No ascites or pneumoperitoneum. Musculoskeletal: No acute abnormalities. Metallic foreign body at the right iliac fossa. Lumbar spine degeneration with L2 and L3 chronic superior endplate fractures. IMPRESSION: VASCULAR 1. No localized source of bleeding.  No sign of active GI bleeding. 2. Generalized atherosclerosis. NON-VASCULAR Mild pneumobilia, correlate for sphincterotomy history. Otherwise stable exam since 2022. Electronically Signed   By: JJorje GuildM.D.   On: 10/21/2022 06:16   DG Chest Portable 1 View  Result Date: 10/21/2022 CLINICAL DATA:  aloc.  syncope EXAM: PORTABLE CHEST 1 VIEW COMPARISON:  Chest x-ray 04/16/2020, CT angio chest 07/13/2019 FINDINGS: The heart and mediastinal contours are within normal limits. Aortic calcification. No focal consolidation. Bibasilar interstitial markings. No pleural effusion. No pneumothorax. No acute osseous abnormality. IMPRESSION: 1. Bibasilar interstitial markings. Query underlying infection or inflammation. 2.  Aortic Atherosclerosis (ICD10-I70.0). Electronically Signed   By: MIven FinnM.D.   On: 10/21/2022 03:59    Pending Labs Unresulted Labs (From admission, onward)     Start     Ordered   10/22/22 0500  HIV Antibody (routine testing w rflx)  (HIV  Antibody (Routine testing w reflex) panel)  Tomorrow morning,   R        10/21/22 0717   10/22/22 0500  CBC  Tomorrow morning,   R        10/21/22 0717   10/22/22 0500  Comprehensive metabolic panel  Tomorrow morning,   R        10/21/22 0717   10/22/22 0500  Magnesium  Tomorrow morning,   R        10/21/22 1206   10/22/22 0500  Phosphorus  Tomorrow morning,   R        10/21/22 1206   10/21/22  0330  Urinalysis, Routine w reflex microscopic -Urine, Clean Catch  Once,   URGENT       Question:  Specimen Source  Answer:  Urine, Clean Catch   10/21/22 0330   10/21/22 0330  Rapid urine drug screen (hospital performed)  Once,   STAT        10/21/22 0330            Vitals/Pain Today's Vitals   10/21/22 0825 10/21/22 0836 10/21/22 0852 10/21/22 1051  BP: 104/70 111/70 (!) 120/97 122/78  Pulse: 98 (!) 104 (!) 104 92  Resp: 19 18 20 16  $ Temp: 97.9 F (36.6 C) 98 F (36.7 C) 97.9 F (36.6 C) 98.3 F (36.8 C)  TempSrc: Oral Oral Oral Oral  SpO2: 100% 100% 96% 100%  Weight:      Height:      PainSc:        Isolation Precautions Airborne and Contact precautions  Medications Medications  pantoprozole (PROTONIX) 80 mg /NS 100 mL infusion (0 mg/hr Intravenous Stopped 10/21/22 0657)  0.9 %  sodium chloride infusion (Manually program via Guardrails IV Fluids) (has no administration in time range)  potassium chloride 10 mEq in 100 mL IVPB (10 mEq Intravenous Not Given 10/21/22 0937)  potassium chloride 10 mEq in 100 mL IVPB (has no administration in time range)  0.9 % NaCl with KCl 40 mEq / L  infusion ( Intravenous New Bag/Given 10/21/22 1013)  magnesium sulfate IVPB 2 g 50 mL (2 g Intravenous New Bag/Given 10/21/22 1245)  magnesium sulfate IVPB 4 g 100 mL (has no administration in time range)  pantoprazole (PROTONIX) 80 mg /NS 100 mL IVPB (0 mg Intravenous Stopped 10/21/22 0504)  lactated ringers bolus 1,000 mL (0 mLs Intravenous Stopped 10/21/22 0503)  lactated ringers bolus 1,000 mL (0 mLs Intravenous Stopped 10/21/22 0657)  iohexol (OMNIPAQUE) 350 MG/ML injection 100 mL (100 mLs Intravenous Contrast Given 10/21/22 0548)    Mobility walks     Focused Assessments GI bleed. Last Hgb 8.8. Lactic 3.2   R Recommendations: See Admitting Provider Note  Report given to:   Additional Notes:  Clear liquid diet at this time. Will be NPO at midnight.

## 2022-10-21 NOTE — ED Provider Notes (Signed)
Care transferred from [facility], PA-C at time of sign out. See their note for full assessment.   Briefly: Patient is 67 y.o. female who presents to the ED with concerns for melena and syncopal episodes.    Plan: Plan per previous PA-C: Consult with hospitalist for admission for GI bleed.  Previous PA-C reached out to GI specialist, Dr. Fuller Plan with [facility] who is aware of patient.  Labs Reviewed  COMPREHENSIVE METABOLIC PANEL - Abnormal; Notable for the following components:      Result Value   Sodium 122 (*)    Potassium 2.4 (*)    Chloride 88 (*)    Glucose, Bld 189 (*)    BUN 34 (*)    Calcium 7.8 (*)    Total Protein 5.1 (*)    Albumin 2.3 (*)    All other components within normal limits  CBC WITH DIFFERENTIAL/PLATELET - Abnormal; Notable for the following components:   RBC 2.17 (*)    Hemoglobin 7.5 (*)    HCT 21.3 (*)    MCH 34.6 (*)    All other components within normal limits  LACTIC ACID, PLASMA - Abnormal; Notable for the following components:   Lactic Acid, Venous 5.3 (*)    All other components within normal limits  RESP PANEL BY RT-PCR (RSV, FLU A&B, COVID)  [id number]  ETHANOL  PROTIME-INR  LIPASE, BLOOD  URINALYSIS, ROUTINE W REFLEX MICROSCOPIC  RAPID URINE DRUG SCREEN, HOSP PERFORMED  CBG MONITORING, ED  POC OCCULT BLOOD, ED  TYPE AND SCREEN  PREPARE RBC (CROSSMATCH)    Clinical Course as of 10/21/22 0644  Mon Oct 21, 2022  0535 Significant delay in getting CTA. Code medical activated. Patient's RN made aware by this provider; I personally called CT who is ready for the patient in CT 2.  [RS]  0601 Patient returned from CT.  [RS]    Clinical Course User Index [RS] Sponseller, Gypsy Balsam, PA-C     Consult with hospitalist, Dr. Olevia Bowens who agrees with admission and will evaluate the patient for admission at this time.   This chart was dictated using voice recognition software, Dragon. Despite the best efforts of this provider to proofread and correct  errors, errors may still occur which can change documentation meaning.   Kelena Garrow A, PA-C 10/21/22 1451    Pattricia Boss, MD 10/21/22 1537

## 2022-10-21 NOTE — ED Provider Notes (Signed)
[facility] Provider Note   CSN: [id number] Arrival date & time: 10/21/22  0315     History  Chief Complaint  Patient presents with   Melena    Angel Hebert is a 67 y.o. female who presents via with concern for  AMS and GI bleeding. Per EMS patient's husband said she sat up in the bed, then syncopized. Subsequently had melanotic / bloody bowel movement, prompting husband's EMS call. Per EMS, patient syncopized x 2 with them, disoriented.   LEVEL 5 caveat due to acuity of presentation on arrival.  Per chart review, CHF, ETOH abuse, asthma, hyperlipidemia,  HTN, and deconditioning. No anticoagulation.  Per chart review Colonoscopy in 2017 with [place] with polyp in the cecum, transverse colon, and sigmoid colon symptoms diverticula, internal hemorrhoids.  HPI     Home Medications Prior to Admission medications   Medication Sig Start Date End Date Taking? Authorizing Provider  atorvastatin (LIPITOR) 20 MG tablet TAKE 1 TABLET BY MOUTH DAILY AT 6PM Patient taking differently: Take 20 mg by mouth daily. 08/07/22  Yes Newlin, Enobong, MD  fluticasone-salmeterol (WIXELA INHUB) 500-50 MCG/ACT AEPB INHALE 1 PUFF INTO THE LUNGS IN THE MORNING AND AT BEDTIME. Patient taking differently: Inhale 1 puff into the lungs in the morning and at bedtime. 05/08/22  Yes Newlin, Charlane Ferretti, MD  losartan-hydrochlorothiazide (HYZAAR) 100-25 MG tablet TAKE 1 TABLET BY MOUTH EVERY DAY 07/31/22  Yes Newlin, Enobong, MD  PROAIR HFA 108 (90 Base) MCG/ACT inhaler INHALE 2 PUFFS INTO THE LUNGS EVERY 6 (SIX) HOURS AS NEEDED FOR WHEEZING OR SHORTNESS OF BREATH. 06/11/21 10/22/23 Yes Newlin, Enobong, MD  albuterol (PROVENTIL) (2.5 MG/3ML) 0.083% nebulizer solution Take 3 mLs (2.5 mg total) by nebulization every 6 (six) hours as needed for wheezing or shortness of breath. Patient not taking: Reported on 10/21/2022 08/16/20   Charlott Rakes, MD  traZODone (DESYREL) 50 MG  tablet Take 1 tablet (50 mg total) by mouth at bedtime as needed for sleep. 07/21/19 09/16/19  Aline August, MD      Allergies    Patient has no known allergies.    Review of Systems   Review of Systems  Unable to perform ROS: Mental status change    Physical Exam Updated Vital Signs BP (!) 101/59   Pulse (!) 101   Temp 97.8 F (36.6 C) (Oral)   Resp 19   Ht 5' 6"$  (1.676 m)   Wt 68 kg   SpO2 94%   BMI 24.21 kg/m  Physical Exam Vitals and nursing note reviewed. Exam conducted with a chaperone present (ED RN stephanie).  Constitutional:      Appearance: She is ill-appearing. She is not toxic-appearing.  HENT:     Head: Normocephalic and atraumatic.     Mouth/Throat:     Mouth: Mucous membranes are moist.     Pharynx: No oropharyngeal exudate or posterior oropharyngeal erythema.  Eyes:     General:        Right eye: No discharge.        Left eye: No discharge.     Conjunctiva/sclera: Conjunctivae normal.  Cardiovascular:     Rate and Rhythm: Normal rate and regular rhythm.     Pulses: Normal pulses.     Heart sounds: Normal heart sounds. No murmur heard. Pulmonary:     Effort: Pulmonary effort is normal. No respiratory distress.     Breath sounds: Normal breath sounds. No wheezing or rales.  Abdominal:  General: Bowel sounds are normal. There is no distension.     Palpations: Abdomen is soft.     Tenderness: There is abdominal tenderness in the epigastric area and periumbilical area. There is no guarding or rebound.  Genitourinary:    Comments: Frank maroon colored blood and melena from rectum in the patient's brief, Musculoskeletal:        General: No deformity.     Cervical back: Neck supple.  Skin:    General: Skin is warm and dry.     Capillary Refill: Capillary refill takes less than 2 seconds.  Neurological:     Mental Status: She is alert. She is disoriented.     ED Results / Procedures / Treatments   Labs (all labs ordered are listed, but only  abnormal results are displayed) Labs Reviewed  COMPREHENSIVE METABOLIC PANEL - Abnormal; Notable for the following components:      Result Value   Sodium 122 (*)    Potassium 2.4 (*)    Chloride 88 (*)    Glucose, Bld 189 (*)    BUN 34 (*)    Calcium 7.8 (*)    Total Protein 5.1 (*)    Albumin 2.3 (*)    All other components within normal limits  CBC WITH DIFFERENTIAL/PLATELET - Abnormal; Notable for the following components:   RBC 2.17 (*)    Hemoglobin 7.5 (*)    HCT 21.3 (*)    MCH 34.6 (*)    All other components within normal limits  LACTIC ACID, PLASMA - Abnormal; Notable for the following components:   Lactic Acid, Venous 5.3 (*)    All other components within normal limits  RESP PANEL BY RT-PCR (RSV, FLU A&B, COVID)  RVPGX2  ETHANOL  PROTIME-INR  LIPASE, BLOOD  URINALYSIS, ROUTINE W REFLEX MICROSCOPIC  RAPID URINE DRUG SCREEN, HOSP PERFORMED  CBG MONITORING, ED  POC OCCULT BLOOD, ED  TYPE AND SCREEN  PREPARE RBC (CROSSMATCH)    EKG EKG Interpretation  Date/Time:  Monday October 21 2022 03:32:39 EST Ventricular Rate:  84 PR Interval:  168 QRS Duration: 122 QT Interval:  424 QTC Calculation: 502 R Axis:   41 Text Interpretation: Sinus rhythm Interpretation limited secondary to artifact Confirmed by Ripley Fraise [id number]) on 10/21/2022 4:27:47 AM  Radiology CT ANGIO GI BLEED  Result Date: 10/21/2022 CLINICAL DATA:  Hemorrhage from the rectum. EXAM: CTA ABDOMEN AND PELVIS WITHOUT AND WITH CONTRAST TECHNIQUE: Multidetector CT imaging of the abdomen and pelvis was performed using the standard protocol during bolus administration of intravenous contrast. Multiplanar reconstructed images and MIPs were obtained and reviewed to evaluate the vascular anatomy. RADIATION DOSE REDUCTION: This exam was performed according to the departmental dose-optimization program which includes automated exposure control, adjustment of the mA and/or kV according to patient size and/or  use of iterative reconstruction technique. CONTRAST:  168m OMNIPAQUE IOHEXOL 350 MG/ML SOLN COMPARISON:  05/22/2021 FINDINGS: VASCULAR Suboptimal arterial opacification due to timing or interruption of contrast based on right heart opacification. Aorta: Atheromatous plaque.  No aneurysm or dissection Celiac: No branch occlusion, beading, or narrowing. SMA: No emergent finding or flow limiting stenosis Renals: Symmetric enhancement without evidence of stenosis IMA: Patent Inflow: Scattered atheromatous calcified plaques. Proximal Outflow: Negative Veins: Negative Review of the MIP images confirms the above findings. NON-VASCULAR Lower chest: Scarring or atelectasis in the left lower lung. Coronary atherosclerosis. Hepatobiliary: No focal liver abnormality. Small volume Litt tubular gas at the left lobe liver, likely pneumobilia in usually related to  prior intervention. No gas seen within the portal venous system. No evidence of biliary obstruction or stone. Pancreas: Unremarkable. Spleen: Unremarkable. Adrenals/Urinary Tract: Negative adrenals. No hydronephrosis or stone. Unremarkable bladder. Stomach/Bowel: No evidence of active intraluminal bleeding. No bowel obstruction or inflammation. Lymphatic: No mass or adenopathy. Reproductive:Symmetric appearance of the adnexa.  Hysterectomy. Other: No ascites or pneumoperitoneum. Musculoskeletal: No acute abnormalities. Metallic foreign body at the right iliac fossa. Lumbar spine degeneration with L2 and L3 chronic superior endplate fractures. IMPRESSION: VASCULAR 1. No localized source of bleeding.  No sign of active GI bleeding. 2. Generalized atherosclerosis. NON-VASCULAR Mild pneumobilia, correlate for sphincterotomy history. Otherwise stable exam since 2022. Electronically Signed   By: Jorje Guild M.D.   On: 10/21/2022 06:16   DG Chest Portable 1 View  Result Date: 10/21/2022 CLINICAL DATA:  aloc.  syncope EXAM: PORTABLE CHEST 1 VIEW COMPARISON:  Chest x-ray  04/16/2020, CT angio chest 07/13/2019 FINDINGS: The heart and mediastinal contours are within normal limits. Aortic calcification. No focal consolidation. Bibasilar interstitial markings. No pleural effusion. No pneumothorax. No acute osseous abnormality. IMPRESSION: 1. Bibasilar interstitial markings. Query underlying infection or inflammation. 2.  Aortic Atherosclerosis (ICD10-I70.0). Electronically Signed   By: Iven Finn M.D.   On: 10/21/2022 03:59    Procedures .Critical Care  Performed by: Emeline Darling, PA-C Authorized by: Emeline Darling, PA-C   Critical care provider statement:    Critical care time (minutes):  60   Critical care was time spent personally by me on the following activities:  Development of treatment plan with patient or surrogate, discussions with consultants, evaluation of patient's response to treatment, examination of patient, obtaining history from patient or surrogate, ordering and performing treatments and interventions, ordering and review of laboratory studies, ordering and review of radiographic studies, pulse oximetry and re-evaluation of patient's condition     Medications Ordered in ED Medications  pantoprozole (PROTONIX) 80 mg /NS 100 mL infusion (8 mg/hr Intravenous New Bag/Given 10/21/22 0459)  0.9 %  sodium chloride infusion (Manually program via Guardrails IV Fluids) (has no administration in time range)  potassium chloride 10 mEq in 100 mL IVPB (10 mEq Intravenous New Bag/Given 10/21/22 0511)  pantoprazole (PROTONIX) 80 mg /NS 100 mL IVPB (0 mg Intravenous Stopped 10/21/22 0504)  lactated ringers bolus 1,000 mL (0 mLs Intravenous Stopped 10/21/22 0503)  lactated ringers bolus 1,000 mL (1,000 mLs Intravenous New Bag/Given 10/21/22 0508)  iohexol (OMNIPAQUE) 350 MG/ML injection 100 mL (100 mLs Intravenous Contrast Given 10/21/22 0548)    ED Course/ Medical Decision Making/ A&P Clinical Course as of 10/21/22 0651  Mon Oct 21, 2022  0535  Significant delay in getting CTA. Code medical activated. Patient's RN made aware by this provider; I personally called CT who is ready for the patient in CT 2.  [RS]  0601 Patient returned from CT.  [RS]    Clinical Course User Index [RS] Denita Lun, Gypsy Balsam, PA-C                            Medical Decision Making 67 year old female who presents with  GI bleeding and AMS.   Tachypneic on intake, A&O x 2. Cardiopulmonary exam is normal, abdominal exam is with central abdominal  TTP. GU exam with frank blood and melena per rectum. Ill-appearing.   DDX includes bu tis not limited to variceal bleeding, peptic ulcer disease, gastritis, aortoenteric fistula, coagulopathy, mesenteric ischemia, IBD, malignancy, hemorrhoids.  Amount and/or Complexity of Data Reviewed Labs: ordered.    Details: CBC with anemia with hbg of 7.5, decreased from patient's baseline near 9. CMP with hypokalemia to 2.4, hyponatremia of 122.  Lactic acid significant limited to 5.3.,  Alcohol level normal, INR is normal. Radiology: ordered.    Details: Chest x-ray with bibasilar interstitial markings, question infection versus inflammation patient without respiratory symptoms at this time. Visualized by this provider. CTA negative.   Risk Prescription drug management. Decision regarding hospitalization.   [name] will require admission to the hospital for further stabilization and management of her GI bleed.  Secure chat has been sent to Dr. Fuller Plan, on-call by her gastroenterologist (Patient has had colonoscopy with [facility] in 2017).  Patient hemodynamically stable, mental status improved following fluids.  Blood products ordered though not yet started.  Patient has 3 runs of potassium IV ordered but will require further repletion.  Will hold off on oral repletion pending CT angiogram of the belly.  CTA negative. No ongoing hemorrhage here. GI aware via secure chat per protocol. Patient hemodynamically stable at this  time.  Consult to hospital medicine provider pending at time of shift change. Signed out to oncoming ED provider S. Blue, PA-C. Pending only consult call from daytime hospitalist.  [name] and her boyfriend  voiced understanding of her medical evaluation and treatment plan. Each of their questions answered to their expressed satisfaction.  They are amenable to plan for admission at this time.   This chart was dictated using voice recognition software, Dragon. Despite the best efforts of this provider to proofread and correct errors, errors may still occur which can change documentation meaning. Final Clinical Impression(s) / ED Diagnoses Final diagnoses:  Gastrointestinal hemorrhage, unspecified gastrointestinal hemorrhage type    Rx / DC Orders ED Discharge Orders     None         Aura Dials 10/21/22 [id number]    Ripley Fraise, MD 10/21/22 [phone]

## 2022-10-21 NOTE — ED Notes (Signed)
Patients IV infiltrated took out both IVs on left arm. Will notify MD

## 2022-10-21 NOTE — H&P (View-Only) (Signed)
Consultation Note   Referring Provider:  [facility] PCP: Angel Rakes, MD Primary Gastroenterologist: Angel Bowie, MD Reason for consultation: anemia, black stool   Hospital Day: 1  Assessment    # 67 yo female with severe anemia ( ? Acute on chronic)  / reported black stool and elevated BUN in setting of recent Aleve.  HGb 7.5, down from 9.9 in December 2022. Baseline hgb unclear. Seems like an upper GI bleed but she reports lower abdominal pain and currently on bedpan passing dark red liquid from rectum. Lower GI bleeding not excluded with certainty   # Electrolyte imbalances with hyponatremia / hypokalemia. Total Ca+ 7.8 ( albumin 2.3). Na+ / K+ repletion in progress.   # History of Etoh abuse. No reported cirrhosis / steatosis on CTA . AST to ALT ratio > 3 but both number within normal range so elevated ratio may be clinically insignificant. Normal INR. Has hypoalbuminemia but could be nutritional.   # Reported weight loss. Cannot quantify. She is 150 pounds, up from 117 in Dec 2022 but no weight available in the interim .   Plan   Eventual EGD, when electrolytes improve. Will need repeat BMET prior to any endoscopic procedures. The risks and benefits of EGD with possible biopsies were discussed with the patient who agrees to proceed.  NPO for now. If not going to do EGD today will give clear liquids later.  Continue PPI Infusion Continue to monitor hgb and transfuse as needed Need CIWA? H+P by TRH in progress  HPI   Patient is a 67 y.o. year old female with a past medical history of adenomatous colon polyps, diverticulosis, hypertension, hyperlipidemia, asthma, Etoh abuse, GERD, bilateral knee osteoarthritis  CHF (EF 45 to 50%)     See PMH for any additional medical problems.  Patient presented to ED early this am via EMS for evaluation of black stool and syncope. She was mildly tachycardic but otherwise  hemodynamically stable.She reports black stool for the last week. She has taken pepto bismol but stools were already black. No upper abdominal pain or N/V but she has been having lower abdominal pain over the last week. Pain gets worse with eating. CTA negative for gi bleeding. Celiac / SMA / IMA patent without narrowing.or limiting stenosis, mild pneumobilia present. Labs notable for hgb of 7.5, BUN 34, sodium 122, K+ 2.4, Cl- 88, albumin 2.3.   positive for for mild pneumobilia. Has gotten a unit of blood, another unit transfusing, Got IV runs of K+ this am and getting K+ in IVF. PPI infusion in progress.  EKG Date/Time:                  Monday October 21 2022 03:32:39 EST Ventricular Rate:         84 PR Interval:                 168 QRS Duration: 122 QT Interval:                 424 QTC Calculation:        502 R Axis:  41 Text Interpretation:      Sinus rhythm Interpretation limited secondary to artifact Confirmed by Ripley Fraise [phone]) on 10/21/2022 4:27:47 AM   Angel Hebert consumes about 80 oz of beer a day. She drinks also drinks liquor about three times a week. This has been her pattern of Etoh intake for a long time.    Previous GI Evaluation:  May 2017 colonoscopy   Surgical [P], sigmoid, transverse, and cecum, polyps(3) - TUBULAR ADENOMA (X6 FRAGMENTS). - NO HIGH GRADE DYSPLASIA OR MALIGNANCY.  Recent Labs and Imaging CT ANGIO GI BLEED  Result Date: 10/21/2022 CLINICAL DATA:  Hemorrhage from the rectum. EXAM: CTA ABDOMEN AND PELVIS WITHOUT AND WITH CONTRAST TECHNIQUE: Multidetector CT imaging of the abdomen and pelvis was performed using the standard protocol during bolus administration of intravenous contrast. Multiplanar reconstructed images and MIPs were obtained and reviewed to evaluate the vascular anatomy. RADIATION DOSE REDUCTION: This exam was performed according to the departmental dose-optimization program which includes automated exposure control,  adjustment of the mA and/or kV according to patient size and/or use of iterative reconstruction technique. CONTRAST:  151m OMNIPAQUE IOHEXOL 350 MG/ML SOLN COMPARISON:  05/22/2021 FINDINGS: VASCULAR Suboptimal arterial opacification due to timing or interruption of contrast based on right heart opacification. Aorta: Atheromatous plaque.  No aneurysm or dissection Celiac: No branch occlusion, beading, or narrowing. SMA: No emergent finding or flow limiting stenosis Renals: Symmetric enhancement without evidence of stenosis IMA: Patent Inflow: Scattered atheromatous calcified plaques. Proximal Outflow: Negative Veins: Negative Review of the MIP images confirms the above findings. NON-VASCULAR Lower chest: Scarring or atelectasis in the left lower lung. Coronary atherosclerosis. Hepatobiliary: No focal liver abnormality. Small volume Litt tubular gas at the left lobe liver, likely pneumobilia in usually related to prior intervention. No gas seen within the portal venous system. No evidence of biliary obstruction or stone. Pancreas: Unremarkable. Spleen: Unremarkable. Adrenals/Urinary Tract: Negative adrenals. No hydronephrosis or stone. Unremarkable bladder. Stomach/Bowel: No evidence of active intraluminal bleeding. No bowel obstruction or inflammation. Lymphatic: No mass or adenopathy. Reproductive:Symmetric appearance of the adnexa.  Hysterectomy. Other: No ascites or pneumoperitoneum. Musculoskeletal: No acute abnormalities. Metallic foreign body at the right iliac fossa. Lumbar spine degeneration with L2 and L3 chronic superior endplate fractures. IMPRESSION: VASCULAR 1. No localized source of bleeding.  No sign of active GI bleeding. 2. Generalized atherosclerosis. NON-VASCULAR Mild pneumobilia, correlate for sphincterotomy history. Otherwise stable exam since 2022. Electronically Signed   By: JJorje GuildM.D.   On: 10/21/2022 06:16   DG Chest Portable 1 View  Result Date: 10/21/2022 CLINICAL DATA:   aloc.  syncope EXAM: PORTABLE CHEST 1 VIEW COMPARISON:  Chest x-ray 04/16/2020, CT angio chest 07/13/2019 FINDINGS: The heart and mediastinal contours are within normal limits. Aortic calcification. No focal consolidation. Bibasilar interstitial markings. No pleural effusion. No pneumothorax. No acute osseous abnormality. IMPRESSION: 1. Bibasilar interstitial markings. Query underlying infection or inflammation. 2.  Aortic Atherosclerosis (ICD10-I70.0). Electronically Signed   By: MIven FinnM.D.   On: 10/21/2022 03:59    Labs:  Recent Labs    10/21/22 0333  WBC 5.0  HGB 7.5*  HCT 21.3*  PLT 153   Recent Labs    10/21/22 0333  NA 122*  K 2.4*  CL 88*  CO2 22  GLUCOSE 189*  BUN 34*  CREATININE 0.93  CALCIUM 7.8*   Recent Labs    10/21/22 0333  PROT 5.1*  ALBUMIN 2.3*  AST 33  ALT 10  ALKPHOS 50  BILITOT 0.5  No results for input(s): "HEPBSAG", "HCVAB", "HEPAIGM", "HEPBIGM" in the last 72 hours. Recent Labs    10/21/22 0333  LABPROT 14.4  INR 1.1    Past Medical History:  Diagnosis Date   Arthritis    Asthma    last year in December   Dyspnea    GERD (gastroesophageal reflux disease)    High cholesterol    Hypertension    Pneumonia 01/2017    Past Surgical History:  Procedure Laterality Date   ABDOMINAL HYSTERECTOMY     FEMUR IM NAIL Left 04/17/2020   Procedure: INTRAMEDULLARY (IM) RETROGRADE FEMORAL NAILING;  Surgeon: Meredith Pel, MD;  Location: [facility];  Service: Orthopedics;  Laterality: Left;   nasal polyps     removed just this past tuesday at [facility] facility over by [facility] Left 10/01/2016   Procedure: LEFT TOTAL KNEE ARTHROPLASTY;  Surgeon: Meredith Pel, MD;  Location: [facility];  Service: Orthopedics;  Laterality: Left;    Family History  Problem Relation Age of Onset   Diabetes Sister    Healthy Mother    Healthy Father    Colon cancer Neg Hx    Breast cancer Neg Hx     Prior to Admission  medications   Medication Sig Start Date End Date Taking? Authorizing Provider  atorvastatin (LIPITOR) 20 MG tablet TAKE 1 TABLET BY MOUTH DAILY AT 6PM Patient taking differently: Take 20 mg by mouth daily. 08/07/22  Yes Newlin, Enobong, MD  fluticasone-salmeterol (WIXELA INHUB) 500-50 MCG/ACT AEPB INHALE 1 PUFF INTO THE LUNGS IN THE MORNING AND AT BEDTIME. Patient taking differently: Inhale 1 puff into the lungs in the morning and at bedtime. 05/08/22  Yes Newlin, Charlane Ferretti, MD  losartan-hydrochlorothiazide (HYZAAR) 100-25 MG tablet TAKE 1 TABLET BY MOUTH EVERY DAY 07/31/22  Yes Newlin, Enobong, MD  PROAIR HFA 108 (90 Base) MCG/ACT inhaler INHALE 2 PUFFS INTO THE LUNGS EVERY 6 (SIX) HOURS AS NEEDED FOR WHEEZING OR SHORTNESS OF BREATH. 06/11/21 10/22/23 Yes Newlin, Enobong, MD  albuterol (PROVENTIL) (2.5 MG/3ML) 0.083% nebulizer solution Take 3 mLs (2.5 mg total) by nebulization every 6 (six) hours as needed for wheezing or shortness of breath. Patient not taking: Reported on 10/21/2022 08/16/20   Angel Rakes, MD  traZODone (DESYREL) 50 MG tablet Take 1 tablet (50 mg total) by mouth at bedtime as needed for sleep. 07/21/19 09/16/19  Aline August, MD    Current Facility-Administered Medications  Medication Dose Route Frequency Provider Last Rate Last Admin   0.9 %  sodium chloride infusion (Manually program via Guardrails IV Fluids)   Intravenous Once Sponseller, Rebekah R, PA-C       0.9 % NaCl with KCl 40 mEq / L  infusion   Intravenous Continuous Reubin Milan, MD       pantoprozole (PROTONIX) 80 mg /NS 100 mL infusion  8 mg/hr Intravenous Continuous Sponseller, Rebekah R, PA-C   Stopped at 10/21/22 [id number]   potassium chloride 10 mEq in 100 mL IVPB  10 mEq Intravenous Q1 Hr x 3 Reubin Milan, MD       Current Outpatient Medications  Medication Sig Dispense Refill   atorvastatin (LIPITOR) 20 MG tablet TAKE 1 TABLET BY MOUTH DAILY AT 6PM (Patient taking differently: Take 20 mg by mouth  daily.) 90 tablet 0   fluticasone-salmeterol (WIXELA INHUB) 500-50 MCG/ACT AEPB INHALE 1 PUFF INTO THE LUNGS IN THE MORNING AND AT BEDTIME. (Patient taking differently: Inhale 1 puff into the lungs  in the morning and at bedtime.) 60 each 1   losartan-hydrochlorothiazide (HYZAAR) 100-25 MG tablet TAKE 1 TABLET BY MOUTH EVERY DAY 90 tablet 0   PROAIR HFA 108 (90 Base) MCG/ACT inhaler INHALE 2 PUFFS INTO THE LUNGS EVERY 6 (SIX) HOURS AS NEEDED FOR WHEEZING OR SHORTNESS OF BREATH. 8.5 g 5   albuterol (PROVENTIL) (2.5 MG/3ML) 0.083% nebulizer solution Take 3 mLs (2.5 mg total) by nebulization every 6 (six) hours as needed for wheezing or shortness of breath. (Patient not taking: Reported on 10/21/2022) 75 mL 3    Allergies as of 10/21/2022   (No Known Allergies)    Social History   Socioeconomic History   Marital status: Single    Spouse name: Not on file   Number of children: Not on file   Years of education: Not on file   Highest education level: Not on file  Occupational History   Not on file  Tobacco Use   Smoking status: Never   Smokeless tobacco: Never  Vaping Use   Vaping Use: Never used  Substance and Sexual Activity   Alcohol use: Yes    Alcohol/week: 1.0 standard drink of alcohol    Types: 1 Shots of liquor per week    Comment: socially   Drug use: No   Sexual activity: Not on file  Other Topics Concern   Not on file  Social History Narrative   Not on file   Social Determinants of Health   Financial Resource Strain: Not on file  Food Insecurity: Not on file  Transportation Needs: Not on file  Physical Activity: Not on file  Stress: Not on file  Social Connections: Not on file  Intimate Partner Violence: Not on file    Review of Systems: All systems reviewed and negative except where noted in HPI.  Physical Exam: Vital signs in last 24 hours: Temp:  [97.3 F (36.3 C)-98 F (36.7 C)] 97.9 F (36.6 C) (02/19 0852) Pulse Rate:  [82-104] 104 (02/19  0852) Resp:  [18-21] 20 (02/19 0852) BP: (93-126)/(59-97) 120/97 (02/19 0852) SpO2:  [94 %-100 %] 96 % (02/19 0852) Weight:  [68 kg] 68 kg (02/19 0328)    General:  Alert female in NAD Psych:  Pleasant, cooperative. Normal mood and affect Eyes: Pupils equal Ears:  Normal auditory acuity Nose: No deformity, discharge or lesions Neck:  Supple, no masses felt Lungs:  Clear to auscultation.  Heart:  Regular rate, regular rhythm.  Abdomen:  Soft, nondistended, nontender, active bowel sounds, no masses felt Rectal :  Deferred. On bedpan. Dried dark red blood in perineal area. Dark red liquid in bedpan.  Msk: Symmetrical without gross deformities.  Neurologic:  Alert, oriented, grossly normal neurologically Extremities : No edema Skin:  Intact without significant lesions.    Intake/Output from previous day: No intake/output data recorded. Intake/Output this shift:  No intake/output data recorded.    Principal Problem:   Acute GI bleeding Active Problems:   Hyperlipidemia   Essential hypertension   GERD   Hypokalemia   GI bleed   Protein-calorie malnutrition, severe (HCC)   Acute on chronic blood loss anemia   Hyponatremia    Tye Savoy, NP-C @  10/21/2022, 9:15 AM

## 2022-10-22 ENCOUNTER — Encounter (HOSPITAL_COMMUNITY)
Admission: EM | Disposition: A | Discharge status: Home Health | Payer: Self-pay | Source: Home / Self Care | Attending: Internal Medicine

## 2022-10-22 ENCOUNTER — Inpatient Hospital Stay (HOSPITAL_COMMUNITY): Payer: 59 | Admitting: Certified Registered Nurse Anesthetist

## 2022-10-22 ENCOUNTER — Encounter (HOSPITAL_COMMUNITY): Payer: Self-pay | Admitting: Internal Medicine

## 2022-10-22 DIAGNOSIS — I10 Essential (primary) hypertension: Secondary | ICD-10-CM | POA: Diagnosis not present

## 2022-10-22 DIAGNOSIS — E871 Hypo-osmolality and hyponatremia: Secondary | ICD-10-CM

## 2022-10-22 DIAGNOSIS — K269 Duodenal ulcer, unspecified as acute or chronic, without hemorrhage or perforation: Secondary | ICD-10-CM

## 2022-10-22 DIAGNOSIS — K219 Gastro-esophageal reflux disease without esophagitis: Secondary | ICD-10-CM | POA: Diagnosis not present

## 2022-10-22 DIAGNOSIS — D62 Acute posthemorrhagic anemia: Secondary | ICD-10-CM

## 2022-10-22 DIAGNOSIS — F1010 Alcohol abuse, uncomplicated: Secondary | ICD-10-CM

## 2022-10-22 DIAGNOSIS — K922 Gastrointestinal hemorrhage, unspecified: Secondary | ICD-10-CM | POA: Diagnosis not present

## 2022-10-22 DIAGNOSIS — J45909 Unspecified asthma, uncomplicated: Secondary | ICD-10-CM

## 2022-10-22 DIAGNOSIS — E876 Hypokalemia: Secondary | ICD-10-CM

## 2022-10-22 DIAGNOSIS — K3189 Other diseases of stomach and duodenum: Secondary | ICD-10-CM

## 2022-10-22 DIAGNOSIS — K860 Alcohol-induced chronic pancreatitis: Secondary | ICD-10-CM

## 2022-10-22 HISTORY — PX: ESOPHAGOGASTRODUODENOSCOPY (EGD) WITH PROPOFOL: SHX5813

## 2022-10-22 HISTORY — PX: BIOPSY: SHX5522

## 2022-10-22 LAB — CBC
HCT: 21.7 % — ABNORMAL LOW (ref 36.0–46.0)
Hemoglobin: 7.6 g/dL — ABNORMAL LOW (ref 12.0–15.0)
MCH: 31.5 pg (ref 26.0–34.0)
MCHC: 35 g/dL (ref 30.0–36.0)
MCV: 90 fL (ref 80.0–100.0)
Platelets: 194 10*3/uL (ref 150–400)
RBC: 2.41 MIL/uL — ABNORMAL LOW (ref 3.87–5.11)
RDW: 16.2 % — ABNORMAL HIGH (ref 11.5–15.5)
WBC: 6 10*3/uL (ref 4.0–10.5)
nRBC: 0 % (ref 0.0–0.2)

## 2022-10-22 LAB — COMPREHENSIVE METABOLIC PANEL
ALT: 8 U/L (ref 0–44)
AST: 24 U/L (ref 15–41)
Albumin: 2.3 g/dL — ABNORMAL LOW (ref 3.5–5.0)
Alkaline Phosphatase: 46 U/L (ref 38–126)
Anion gap: 8 (ref 5–15)
BUN: 15 mg/dL (ref 8–23)
CO2: 23 mmol/L (ref 22–32)
Calcium: 8 mg/dL — ABNORMAL LOW (ref 8.9–10.3)
Chloride: 100 mmol/L (ref 98–111)
Creatinine, Ser: 0.65 mg/dL (ref 0.44–1.00)
GFR, Estimated: >60 mL/min (ref >60)
Glucose, Bld: 90 mg/dL (ref 70–99)
Potassium: 3.3 mmol/L — ABNORMAL LOW (ref 3.5–5.1)
Sodium: 131 mmol/L — ABNORMAL LOW (ref 135–145)
Total Bilirubin: 0.7 mg/dL (ref 0.3–1.2)
Total Protein: 5.2 g/dL — ABNORMAL LOW (ref 6.5–8.1)

## 2022-10-22 LAB — LACTIC ACID, PLASMA: Lactic Acid, Venous: 0.9 mmol/L (ref 0.5–1.9)

## 2022-10-22 LAB — IRON AND TIBC
Iron: 112 ug/dL (ref 28–170)
Saturation Ratios: 42 % — ABNORMAL HIGH (ref 10.4–31.8)
TIBC: 265 ug/dL (ref 250–450)
UIBC: 153 ug/dL

## 2022-10-22 LAB — HEMOGLOBIN AND HEMATOCRIT, BLOOD
HCT: 22.6 % — ABNORMAL LOW (ref 36.0–46.0)
Hemoglobin: 8 g/dL — ABNORMAL LOW (ref 12.0–15.0)

## 2022-10-22 LAB — MAGNESIUM: Magnesium: 2.1 mg/dL (ref 1.7–2.4)

## 2022-10-22 LAB — VITAMIN B12: Vitamin B-12: 347 pg/mL (ref 180–914)

## 2022-10-22 LAB — FERRITIN: Ferritin: 135 ng/mL (ref 11–307)

## 2022-10-22 LAB — RETICULOCYTES
Immature Retic Fract: 31.3 % — ABNORMAL HIGH (ref 2.3–15.9)
RBC.: 2.47 MIL/uL — ABNORMAL LOW (ref 3.87–5.11)
Retic Count, Absolute: 57.1 10*3/uL (ref 19.0–186.0)
Retic Ct Pct: 2.3 % (ref 0.4–3.1)

## 2022-10-22 LAB — HIV ANTIBODY (ROUTINE TESTING W REFLEX): HIV Screen 4th Generation wRfx: NONREACTIVE

## 2022-10-22 LAB — FOLATE: Folate: 9.9 ng/mL (ref >5.9)

## 2022-10-22 LAB — PHOSPHORUS: Phosphorus: 2 mg/dL — ABNORMAL LOW (ref 2.5–4.6)

## 2022-10-22 SURGERY — ESOPHAGOGASTRODUODENOSCOPY (EGD) WITH PROPOFOL
Anesthesia: Monitor Anesthesia Care

## 2022-10-22 MED ORDER — MOMETASONE FURO-FORMOTEROL FUM 200-5 MCG/ACT IN AERO
2.0000 | INHALATION_SPRAY | Freq: Two times a day (BID) | RESPIRATORY_TRACT | Status: DC
  Administered 2022-10-22 – 2022-10-25 (×7): 2 via RESPIRATORY_TRACT
  Filled 2022-10-22: qty 8.8

## 2022-10-22 MED ORDER — LIDOCAINE 2% (20 MG/ML) 5 ML SYRINGE
INTRAMUSCULAR | Status: DC | PRN
  Administered 2022-10-22: 40 mg via INTRAVENOUS
  Administered 2022-10-22: 20 mg via INTRAVENOUS

## 2022-10-22 MED ORDER — POTASSIUM CHLORIDE CRYS ER 20 MEQ PO TBCR
40.0000 meq | EXTENDED_RELEASE_TABLET | ORAL | Status: AC
  Administered 2022-10-22 (×2): 40 meq via ORAL
  Filled 2022-10-22 (×2): qty 2

## 2022-10-22 MED ORDER — LORAZEPAM 1 MG PO TABS: 1.0000 mg | ORAL_TABLET | ORAL | Status: AC | PRN

## 2022-10-22 MED ORDER — LACTATED RINGERS IV SOLN: INTRAVENOUS | Status: DC

## 2022-10-22 MED ORDER — ADULT MULTIVITAMIN W/MINERALS CH
1.0000 | ORAL_TABLET | Freq: Every day | ORAL | Status: DC
  Administered 2022-10-22 – 2022-10-25 (×4): 1 via ORAL
  Filled 2022-10-22 (×4): qty 1

## 2022-10-22 MED ORDER — ALBUTEROL SULFATE (2.5 MG/3ML) 0.083% IN NEBU: 2.5000 mg | INHALATION_SOLUTION | Freq: Four times a day (QID) | RESPIRATORY_TRACT | Status: DC | PRN

## 2022-10-22 MED ORDER — PROPOFOL 10 MG/ML IV BOLUS
INTRAVENOUS | Status: DC | PRN
  Administered 2022-10-22 (×5): 10 mg via INTRAVENOUS

## 2022-10-22 MED ORDER — SODIUM CHLORIDE 0.9 % IV SOLN: INTRAVENOUS | Status: DC

## 2022-10-22 MED ORDER — LORAZEPAM 2 MG/ML IJ SOLN: 1.0000 mg | INTRAMUSCULAR | Status: AC | PRN

## 2022-10-22 MED ORDER — FOLIC ACID 1 MG PO TABS
1.0000 mg | ORAL_TABLET | Freq: Every day | ORAL | Status: DC
  Administered 2022-10-22 – 2022-10-25 (×4): 1 mg via ORAL
  Filled 2022-10-22 (×4): qty 1

## 2022-10-22 MED ORDER — THIAMINE MONONITRATE 100 MG PO TABS
100.0000 mg | ORAL_TABLET | Freq: Every day | ORAL | Status: DC
  Administered 2022-10-22 – 2022-10-25 (×4): 100 mg via ORAL
  Filled 2022-10-22 (×4): qty 1

## 2022-10-22 MED ORDER — SIMETHICONE 40 MG/0.6ML PO SUSP
ORAL | Status: AC
  Filled 2022-10-22: qty 0.6

## 2022-10-22 MED ORDER — PHENYLEPHRINE 80 MCG/ML (10ML) SYRINGE FOR IV PUSH (FOR BLOOD PRESSURE SUPPORT)
PREFILLED_SYRINGE | INTRAVENOUS | Status: DC | PRN
  Administered 2022-10-22 (×4): 80 ug via INTRAVENOUS

## 2022-10-22 MED ORDER — THIAMINE HCL 100 MG/ML IJ SOLN: 100.0000 mg | Freq: Every day | INTRAMUSCULAR | Status: DC

## 2022-10-22 MED ORDER — PROPOFOL 500 MG/50ML IV EMUL
INTRAVENOUS | Status: DC | PRN
  Administered 2022-10-22: 150 ug/kg/min via INTRAVENOUS

## 2022-10-22 SURGICAL SUPPLY — 15 items

## 2022-10-22 NOTE — Interval H&P Note (Signed)
History and Physical Interval Note:  10/22/2022 7:31 AM  Angel Hebert  has presented today for surgery, with the diagnosis of anemia, gastrointestinal bleeding.  The various methods of treatment have been discussed with the patient and family. After consideration of risks, benefits and other options for treatment, the patient has consented to  Procedure(s): ESOPHAGOGASTRODUODENOSCOPY (EGD) WITH PROPOFOL (N/A) as a surgical intervention.  The patient's history has been reviewed, patient examined, no change in status, stable for surgery.  I have reviewed the patient's chart and labs.  Questions were answered to the patient's satisfaction.     [place]

## 2022-10-22 NOTE — Progress Notes (Signed)
PROGRESS NOTE  Angel Hebert [id number] DOB: 1956/07/02   PCP: Charlott Rakes, MD  Patient is from: Home  DOA: 10/21/2022 LOS: 1  Chief complaints Chief Complaint  Patient presents with   Melena     Brief Narrative / Interim history: 67 year old F with PMH of alcohol abuse, alcoholic pancreatitis, GIB, GERD, IDA, osteoarthritis, NSAID use, asthma, HTN and HLD presenting with possible syncopal episode, epigastric abdominal pain and melanotic stool, and admitted for acute blood loss anemia in the setting of acute GI bleed, and significant electrolyte derangement with hyponatremia, hypomagnesemia, hypokalemia and hypophosphatemia.  Hgb 7.5 (baseline 9-10).  She also had markedly elevated lactic acidosis likely from alcohol.  It seems 2 units of blood ordered but she never been transfused.  GI consulted.  Patient underwent EGD that showed normal stomach (biopsied) and nonbleeding duodenal ulcer with no stigmata of bleeding.  GI recommended continuing PPI drip for 24 hours and changing to twice daily, advancing diet as tolerated, serial H&H, following pathology, avoiding NSAIDs and repeat endoscopy in 8 weeks  Subjective: Seen and examined earlier this morning after she returned from EGD.  No major events this morning.  No complaints other than "a little" abdominal pain.  She denies nausea or vomiting.  She denies drinking alcohol recently but not a great historian.  She seems to have cognitive impairment.  She is awake alert and oriented to self, place and person but not time.  Objective: Vitals:   10/22/22 0805 10/22/22 0830 10/22/22 1112 10/22/22 1140  BP: 107/73 (!) 109/93  117/88  Pulse: 96   90  Resp: 20   15  Temp: 98.5 F (36.9 C)   97.8 F (36.6 C)  TempSrc:    Oral  SpO2: 100% 96% 96% 94%  Weight:      Height:        Examination:  GENERAL: No apparent distress.  Nontoxic. HEENT: MMM.  Vision and hearing grossly intact.  NECK: Supple.  No apparent JVD.  RESP:   No IWOB.  Fair aeration bilaterally. CVS:  RRR. Heart sounds normal.  ABD/GI/GU: BS+. Abd soft, NTND.  MSK/EXT:  Moves extremities. No apparent deformity. No edema.  SKIN: no apparent skin lesion or wound NEURO: Awake, alert and oriented to self and place but not time.  No apparent focal neuro deficit. PSYCH: Calm. Normal affect.   Procedures:  2/20-EGD-normal esophagus, normal stomach (biopsied) and nonbleeding duodenal ulcer with no stigmata of bleeding.    Microbiology summarized: [id number], influenza and RSV PCR nonreactive.  Assessment and plan: Principal Problem:   Acute GI bleeding Active Problems:   Hyperlipidemia   Essential hypertension   GERD   Hypokalemia   Acute on chronic blood loss anemia   Hyponatremia   Hypomagnesemia   Hyperphosphatemia   Duodenal ulcer  Acute on chronic blood loss anemia due to upper GI bleed: Baseline Hgb 9-10.  Reportedly uses naproxen daily for pain.  2 units of blood ordered on admission but she has not received.  EGD shows nonbleeding duodenal ulcer. Recent Labs    10/21/22 0333 10/21/22 1030 10/22/22 0319  HGB 7.5* 8.8* 7.6*  -GI recommended continuing PPI drip for 24 hours and changing to twice daily, advancing diet as tolerated, serial H&H, following pathology, avoiding NSAIDs and repeat endoscopy in 8 weeks -Transfuse for Hgb less than 7.0. -Check anemia panel -Advised to avoid NSAIDs.   Hyponatremia/hypokalemia/hypomagnesemia/hypophosphatemia: Due to alcohol?  Also on Hyzaar. -Monitor replenish as appropriate. -Continue holding Hyzaar.  Hypophosphatemia: Resolved.  Lactic acidosis: Likely due to alcohol.  Resolved.  Low suspicion for infection.   Essential hypertension: Normotensive off home antihypertensive meds. -Continue holding Hyzaar.  Cognitive impairment?  She is awake and alert but only oriented to self, place and person but not time.  Limited insight -Reorientation and delirium precautions.  EtOH  abuse/chronic alcoholic pancreatitis: Reportedly drinks beer daily.  Patient was not able to quantify. -Closely monitor for withdrawal symptoms. -CIWA with as needed Ativan, multivitamin, folic acid and thiamine  History of asthma: Stable. -Continue home inhalers   Body mass index is 24.21 kg/m.          DVT prophylaxis:  SCDs Start: 10/21/22 [id number]  Code Status: Full code. Family Communication: None at bedside Level of care: Med-Surg Status is: Inpatient Remains inpatient appropriate because: Acute GI bleed and electrolyte derangement   Final disposition: TBD Consultants:  Gastroenterology  55 minutes with more than 50% spent in reviewing records, counseling patient/family and coordinating care.   Sch Meds:  Scheduled Meds:  sodium chloride   Intravenous Once   folic acid  1 mg Oral Daily   mometasone-formoterol  2 puff Inhalation BID   multivitamin with minerals  1 tablet Oral Daily   thiamine  100 mg Oral Daily   Or   thiamine  100 mg Intravenous Daily   Continuous Infusions:  pantoprazole 8 mg/hr (10/22/22 0407)   PRN Meds:.  Antimicrobials: Anti-infectives (From admission, onward)    None        I have personally reviewed the following labs and images: CBC: Recent Labs  Lab 10/21/22 0333 10/21/22 1030 10/22/22 0319  WBC 5.0  --  6.0  NEUTROABS 3.1  --   --   HGB 7.5* 8.8* 7.6*  HCT 21.3* 24.6* 21.7*  MCV 98.2  --  90.0  PLT 153  --  194   BMP &GFR Recent Labs  Lab 10/21/22 0333 10/21/22 1025 10/22/22 0319  NA 122*  --  131*  K 2.4*  --  3.3*  CL 88*  --  100  CO2 22  --  23  GLUCOSE 189*  --  90  BUN 34*  --  15  CREATININE 0.93  --  0.65  CALCIUM 7.8*  --  8.0*  MG  --  1.1* 2.1  PHOS  --  11.1* 2.0*   Estimated Creatinine Clearance: 65.6 mL/min (by C-G formula based on SCr of 0.65 mg/dL). Liver & Pancreas: Recent Labs  Lab 10/21/22 0333 10/22/22 0319  AST 33 24  ALT 10 8  ALKPHOS 50 46  BILITOT 0.5 0.7  PROT 5.1*  5.2*  ALBUMIN 2.3* 2.3*   Recent Labs  Lab 10/21/22 0333  LIPASE 37   No results for input(s): "AMMONIA" in the last 168 hours. Diabetic: No results for input(s): "HGBA1C" in the last 72 hours. No results for input(s): "GLUCAP" in the last 168 hours. Cardiac Enzymes: No results for input(s): "CKTOTAL", "CKMB", "CKMBINDEX", "TROPONINI" in the last 168 hours. No results for input(s): "PROBNP" in the last 8760 hours. Coagulation Profile: Recent Labs  Lab 10/21/22 0333  INR 1.1   Thyroid Function Tests: No results for input(s): "TSH", "T4TOTAL", "FREET4", "T3FREE", "THYROIDAB" in the last 72 hours. Lipid Profile: No results for input(s): "CHOL", "HDL", "LDLCALC", "TRIG", "CHOLHDL", "LDLDIRECT" in the last 72 hours. Anemia Panel: Recent Labs    10/22/22 1002  VITAMINB12 347  FOLATE 9.9  FERRITIN 135  TIBC 265  IRON 112  RETICCTPCT 2.3   Urine  analysis:    Component Value Date/Time   COLORURINE YELLOW 07/25/2021 1000   APPEARANCEUR TURBID (A) 07/25/2021 1000   LABSPEC <1.005 (L) 07/25/2021 1000   PHURINE 7.0 07/25/2021 1000   GLUCOSEU NEGATIVE 07/25/2021 1000   HGBUR MODERATE (A) 07/25/2021 1000   BILIRUBINUR SMALL (A) 07/25/2021 1000   KETONESUR NEGATIVE 07/25/2021 1000   PROTEINUR NEGATIVE 07/25/2021 1000   UROBILINOGEN 0.2 10/08/2012 1224   NITRITE NEGATIVE 07/25/2021 1000   LEUKOCYTESUR LARGE (A) 07/25/2021 1000   Sepsis Labs: Invalid input(s): "PROCALCITONIN", "LACTICIDVEN"  Microbiology: Recent Results (from the past 240 hour(s))  Resp panel by RT-PCR (RSV, Flu A&B, Covid) Anterior Nasal Swab     Status: None   Collection Time: 10/21/22  5:15 AM   Specimen: Anterior Nasal Swab  Result Value Ref Range Status   SARS Coronavirus 2 by RT PCR NEGATIVE NEGATIVE Final   Influenza A by PCR NEGATIVE NEGATIVE Final   Influenza B by PCR NEGATIVE NEGATIVE Final    Comment: (NOTE) The Xpert Xpress SARS-CoV-2/FLU/RSV plus assay is intended as an aid in the  diagnosis of influenza from Nasopharyngeal swab specimens and should not be used as a sole basis for treatment. Nasal washings and aspirates are unacceptable for Xpert Xpress SARS-CoV-2/FLU/RSV testing.  Fact Sheet for Patients: EntrepreneurPulse.com.au  Fact Sheet for Healthcare Providers: IncredibleEmployment.be  This test is not yet approved or cleared by the [place] FDA and has been authorized for detection and/or diagnosis of SARS-CoV-2 by FDA under an Emergency Use Authorization (EUA). This EUA will remain in effect (meaning this test can be used) for the duration of the COVID-19 declaration under Section 564(b)(1) of the Act, 21 U.S.C. section 360bbb-3(b)(1), unless the authorization is terminated or revoked.     Resp Syncytial Virus by PCR NEGATIVE NEGATIVE Final    Comment: (NOTE) Fact Sheet for Patients: EntrepreneurPulse.com.au  Fact Sheet for Healthcare Providers: IncredibleEmployment.be  This test is not yet approved or cleared by the [place] FDA and has been authorized for detection and/or diagnosis of SARS-CoV-2 by FDA under an Emergency Use Authorization (EUA). This EUA will remain in effect (meaning this test can be used) for the duration of the COVID-19 declaration under Section 564(b)(1) of the Act, 21 U.S.C. section 360bbb-3(b)(1), unless the authorization is terminated or revoked.  Performed at [facility], [place] [address] [place], [place] [address]     Radiology Studies: No results found.    Journey Castonguay T. Hatton  If 7PM-7AM, please contact night-coverage [email or web address] 10/22/2022, 1:06 PM

## 2022-10-22 NOTE — Anesthesia Procedure Notes (Addendum)
Procedure Name: MAC Date/Time: 10/22/2022 7:37 AM  Performed by: Janene Harvey, CRNAPre-anesthesia Checklist: Patient identified, Emergency Drugs available, Suction available and Patient being monitored Patient Re-evaluated:Patient Re-evaluated prior to induction Oxygen Delivery Method: Nasal cannula Induction Type: IV induction Placement Confirmation: positive ETCO2 Dental Injury: Teeth and Oropharynx as per pre-operative assessment  Comments: Heated high flow cannula

## 2022-10-22 NOTE — Anesthesia Postprocedure Evaluation (Signed)
Anesthesia Post Note  Patient: Christol Razvi  Procedure(s) Performed: ESOPHAGOGASTRODUODENOSCOPY (EGD) WITH PROPOFOL BIOPSY     Patient location during evaluation: Endoscopy Anesthesia Type: MAC Level of consciousness: awake and alert Pain management: pain level controlled Vital Signs Assessment: post-procedure vital signs reviewed and stable Respiratory status: spontaneous breathing, nonlabored ventilation, respiratory function stable and patient connected to nasal cannula oxygen Cardiovascular status: stable and blood pressure returned to baseline Postop Assessment: no apparent nausea or vomiting Anesthetic complications: no   No notable events documented.  Last Vitals:  Vitals:   10/22/22 1112 10/22/22 1140  BP:  117/88  Pulse:  90  Resp:  15  Temp:  36.6 C  SpO2: 96% 94%    Last Pain:  Vitals:   10/22/22 1140  TempSrc: Oral  PainSc:                  [place]

## 2022-10-22 NOTE — Transfer of Care (Signed)
Immediate Anesthesia Transfer of Care Note  Patient: Angel Hebert  Procedure(s) Performed: ESOPHAGOGASTRODUODENOSCOPY (EGD) WITH PROPOFOL BIOPSY  Patient Location: PACU  Anesthesia Type:MAC  Level of Consciousness: drowsy and patient cooperative  Airway & Oxygen Therapy: Patient Spontanous Breathing  Post-op Assessment: Report given to RN and Post -op Vital signs reviewed and stable  Post vital signs: Reviewed and stable  Last Vitals:  Vitals Value Taken Time  BP    Temp    Pulse    Resp    SpO2      Last Pain:  Vitals:   10/22/22 0657  TempSrc: Temporal  PainSc: 0-No pain         Complications: No notable events documented.

## 2022-10-22 NOTE — Plan of Care (Signed)
  Problem: Education: Goal: Knowledge of General Education information will improve Description: Including pain rating scale, medication(s)/side effects and non-pharmacologic comfort measures Outcome: Progressing   Problem: Clinical Measurements: Goal: Ability to maintain clinical measurements within normal limits will improve Outcome: Progressing   

## 2022-10-22 NOTE — Op Note (Addendum)
[facility] Patient Name: Angel Hebert Procedure Date : 10/22/2022 MRN: [id number] Attending MD: Thornton Park MD, MD, [id number] Date of Birth: November 10, 1955 CSN: [id number] Age: 67 Admit Type: Inpatient Procedure:                Upper GI endoscopy Indications:              Suspected upper gastrointestinal bleeding Providers:                Thornton Park MD, MD, Fransico Setters Mbumina,                            Jerrye Noble Referring MD:              Medicines:                Monitored Anesthesia Care Complications:            No immediate complications. Estimated Blood Loss:     Estimated blood loss was minimal. Procedure:                Pre-Anesthesia Assessment:                           - Prior to the procedure, a History and Physical                            was performed, and patient medications and                            allergies were reviewed. The patient's tolerance of                            previous anesthesia was also reviewed. The risks                            and benefits of the procedure and the sedation                            options and risks were discussed with the patient.                            All questions were answered, and informed consent                            was obtained. Prior Anticoagulants: The patient has                            taken no anticoagulant or antiplatelet agents. ASA                            Grade Assessment: III - A patient with severe                            systemic disease. After reviewing the risks and  benefits, the patient was deemed in satisfactory                            condition to undergo the procedure.                           After obtaining informed consent, the endoscope was                            passed under direct vision. Throughout the                            procedure, the patient's blood pressure, pulse, and                             oxygen saturations were monitored continuously. The                            [id number]-[id number] Olympus endoscope was introduced                            through the mouth, and advanced to the second part                            of duodenum. The upper GI endoscopy was                            accomplished without difficulty. The patient                            tolerated the procedure well. Photographs taken                            during the procedure were not completely captured                            due to an issue with the software. The pictures of                            the duodenal ulcer present in this report show                            blood - but that was a result of the gastric                            biopsies taken prior to the photograph as the                            duodenal ulcer photos captured prior to the                            biopsies were not stored by the computer. Scope In: Scope Out: Findings:      The esophagus was  normal.      Localized mildly erythematous mucosa without bleeding was found in the       prepyloric region of the stomach. Biopsies were taken from the antrum,       body, and fundus with a cold forceps for histology. Estimated blood loss       was minimal.      One non-bleeding cratered duodenal ulcer with no stigmata of bleeding       was found in the duodenal bulb. The lesion was 10 mm in largest       dimension. The photo shows blood near the ulcer, but, this blood was a       result of the gastric biopsies not bleeding from the ulcer itself. There       was some mild scope trauma that occurred superior to the ulcer when the       scope popped through the pylorus into the duodenum.      The cardia and gastric fundus were normal on retroflexion.      The exam was otherwise without abnormality. Impression:               - Normal esophagus.                           - Normal stomach. Biopsied.                            - Non-bleeding duodenal ulcer with no stigmata of                            bleeding.                           - The examination was otherwise normal. Recommendation:           - Return patient to hospital ward for ongoing care.                           - Advance diet as tolerated.                           - Continue present medications.                           - Continue pantoprazole continuous infusion for 24                            hours, then start BID dosing schedule.                           - Continue serial hgb/hct with transfusion as                            indicated.                           - Await pathology results.                           - Avoid all NSAIDs.                           -  Repeat upper endoscopy in 8 weeks to check                            healing.                           - The findings and recommendations were discussed                            with the patient. I was unable to reach her family                            by phone. Procedure Code(s):        --- Professional ---                           [phone], Esophagogastroduodenoscopy, flexible,                            transoral; with biopsy, single or multiple Diagnosis Code(s):        --- Professional ---                           K26.9, Duodenal ulcer, unspecified as acute or                            chronic, without hemorrhage or perforation CPT copyright 2022 American [employer] Association. All rights reserved. The codes documented in this report are preliminary and upon coder review may  be revised to meet current compliance requirements. Thornton Park MD, MD 10/22/2022 8:15:00 AM This report has been signed electronically. Number of Addenda: 0

## 2022-10-22 NOTE — Anesthesia Preprocedure Evaluation (Signed)
Anesthesia Evaluation  Patient identified by MRN, date of birth, ID band Patient awake    Reviewed: Allergy & Precautions, H&P , NPO status , Patient's Chart, lab work & pertinent test results  Airway Mallampati: II   Neck ROM: full    Dental   Pulmonary shortness of breath, asthma    breath sounds clear to auscultation       Cardiovascular hypertension,  Rhythm:regular Rate:Normal     Neuro/Psych    GI/Hepatic ,GERD  ,,  Endo/Other    Renal/GU      Musculoskeletal  (+) Arthritis ,    Abdominal   Peds  Hematology  (+) Blood dyscrasia, anemia Hemoglobin 7.6   Anesthesia Other Findings   Reproductive/Obstetrics                             Anesthesia Physical Anesthesia Plan  ASA: 3  Anesthesia Plan: MAC   Post-op Pain Management:    Induction: Intravenous  PONV Risk Score and Plan: 2 and Propofol infusion and Treatment may vary due to age or medical condition  Airway Management Planned: Nasal Cannula  Additional Equipment:   Intra-op Plan:   Post-operative Plan:   Informed Consent: I have reviewed the patients History and Physical, chart, labs and discussed the procedure including the risks, benefits and alternatives for the proposed anesthesia with the patient or authorized representative who has indicated his/her understanding and acceptance.     Dental advisory given  Plan Discussed with: CRNA, Anesthesiologist and Surgeon  Anesthesia Plan Comments:        Anesthesia Quick Evaluation

## 2022-10-23 ENCOUNTER — Encounter (HOSPITAL_COMMUNITY): Payer: Self-pay | Admitting: Gastroenterology

## 2022-10-23 ENCOUNTER — Other Ambulatory Visit: Payer: Self-pay | Admitting: Nurse Practitioner

## 2022-10-23 ENCOUNTER — Ambulatory Visit: Payer: Medicaid Other | Admitting: Family Medicine

## 2022-10-23 DIAGNOSIS — K922 Gastrointestinal hemorrhage, unspecified: Secondary | ICD-10-CM | POA: Diagnosis not present

## 2022-10-23 DIAGNOSIS — D62 Acute posthemorrhagic anemia: Secondary | ICD-10-CM | POA: Diagnosis not present

## 2022-10-23 DIAGNOSIS — D649 Anemia, unspecified: Secondary | ICD-10-CM

## 2022-10-23 LAB — COMPREHENSIVE METABOLIC PANEL
ALT: 11 U/L (ref 0–44)
AST: 29 U/L (ref 15–41)
Albumin: 2.5 g/dL — ABNORMAL LOW (ref 3.5–5.0)
Alkaline Phosphatase: 47 U/L (ref 38–126)
Anion gap: 10 (ref 5–15)
BUN: 7 mg/dL — ABNORMAL LOW (ref 8–23)
CO2: 18 mmol/L — ABNORMAL LOW (ref 22–32)
Calcium: 8.3 mg/dL — ABNORMAL LOW (ref 8.9–10.3)
Chloride: 105 mmol/L (ref 98–111)
Creatinine, Ser: 0.71 mg/dL (ref 0.44–1.00)
GFR, Estimated: >60 mL/min (ref >60)
Glucose, Bld: 89 mg/dL (ref 70–99)
Potassium: 4.3 mmol/L (ref 3.5–5.1)
Sodium: 133 mmol/L — ABNORMAL LOW (ref 135–145)
Total Bilirubin: 0.5 mg/dL (ref 0.3–1.2)
Total Protein: 5.1 g/dL — ABNORMAL LOW (ref 6.5–8.1)

## 2022-10-23 LAB — CBC
HCT: 22.7 % — ABNORMAL LOW (ref 36.0–46.0)
Hemoglobin: 7.4 g/dL — ABNORMAL LOW (ref 12.0–15.0)
MCH: 31.6 pg (ref 26.0–34.0)
MCHC: 32.6 g/dL (ref 30.0–36.0)
MCV: 97 fL (ref 80.0–100.0)
Platelets: 204 10*3/uL (ref 150–400)
RBC: 2.34 MIL/uL — ABNORMAL LOW (ref 3.87–5.11)
RDW: 17.1 % — ABNORMAL HIGH (ref 11.5–15.5)
WBC: 4.6 10*3/uL (ref 4.0–10.5)
nRBC: 0 % (ref 0.0–0.2)

## 2022-10-23 LAB — MAGNESIUM: Magnesium: 1.6 mg/dL — ABNORMAL LOW (ref 1.7–2.4)

## 2022-10-23 LAB — PHOSPHORUS: Phosphorus: 2.3 mg/dL — ABNORMAL LOW (ref 2.5–4.6)

## 2022-10-23 MED ORDER — PANTOPRAZOLE SODIUM 40 MG PO TBEC
40.0000 mg | DELAYED_RELEASE_TABLET | Freq: Two times a day (BID) | ORAL | Status: DC
  Administered 2022-10-24 – 2022-10-25 (×3): 40 mg via ORAL
  Filled 2022-10-23 (×3): qty 1

## 2022-10-23 MED ORDER — MAGNESIUM SULFATE 2 GM/50ML IV SOLN
2.0000 g | Freq: Once | INTRAVENOUS | Status: AC
  Administered 2022-10-23: 2 g via INTRAVENOUS
  Filled 2022-10-23: qty 50

## 2022-10-23 MED ORDER — HYDROCODONE-ACETAMINOPHEN 5-325 MG PO TABS
1.0000 | ORAL_TABLET | Freq: Four times a day (QID) | ORAL | Status: DC | PRN
  Administered 2022-10-23 – 2022-10-24 (×2): 1 via ORAL
  Filled 2022-10-23 (×2): qty 1

## 2022-10-23 MED ORDER — SODIUM PHOSPHATES 45 MMOLE/15ML IV SOLN
15.0000 mmol | Freq: Once | INTRAVENOUS | Status: AC
  Administered 2022-10-23: 15 mmol via INTRAVENOUS
  Filled 2022-10-23: qty 5

## 2022-10-23 NOTE — Evaluation (Signed)
Occupational Therapy Evaluation Patient Details Name: Angel Hebert MRN: [id number] DOB: 1956-05-29 Today's Date: 10/23/2022   History of Present Illness 67 y/o F admitted to [facility] on 2/19 for melena and possible syncopal episode. Pt underwent EGD revelaing nonbleeding duodenal ulcer. PMHx: alcohol abuse, alcoholic pancreatitis, GIB, GERD, IDA, OA, NSAID use, asthma, HTN, and HLD.   Clinical Impression   PTA, pt lives with significant other in a boarding house, typically [place] with ADLs and mobility with intermittent Rollator use. Pt presents now with minor deficits in strength, endurance and standing balance. Pt reliant on UE support for mobility at this time with min guard for safety. Pt requires Setup for UB ADL, Min guard for LB ADLs and Min A for simulated tub transfer. Provided UE HEP during session w/ plans to review in next session. Anticipate no OT needs at DC with significant other to assist as needed (present during session).      Recommendations for follow up therapy are one component of a multi-disciplinary discharge planning process, led by the attending physician.  Recommendations may be updated based on patient status, additional functional criteria and insurance authorization.   Follow Up Recommendations  No OT follow up     Assistance Recommended at Discharge Intermittent Supervision/Assistance  Patient can return home with the following A little help with bathing/dressing/bathroom;Assistance with cooking/housework    Functional Status Assessment  Patient has had a recent decline in their functional status and demonstrates the ability to make significant improvements in function in a reasonable and predictable amount of time.  Equipment Recommendations  None recommended by OT    Recommendations for Other Services       Precautions / Restrictions Precautions Precautions: Fall Restrictions Weight Bearing Restrictions: No      Mobility Bed  Mobility Overal bed mobility: Modified Independent                  Transfers Overall transfer level: Needs assistance Equipment used: None Transfers: Sit to/from Stand Sit to Stand: Min guard           General transfer comment: able to stand without AD but requires at least one UE support with dynamic tasks      Balance Overall balance assessment: Needs assistance Sitting-balance support: No upper extremity supported, Feet supported Sitting balance-Leahy Scale: Good     Standing balance support: No upper extremity supported, Single extremity supported, During functional activity Standing balance-Leahy Scale: Fair Standing balance comment: at least one UE support for dynamic tasks needed                           ADL either performed or assessed with clinical judgement   ADL Overall ADL's : Needs assistance/impaired Eating/Feeding: Independent   Grooming: Min guard;Standing   Upper Body Bathing: Set up;Sitting   Lower Body Bathing: Min guard;Sit to/from stand   Upper Body Dressing : Set up;Sitting   Lower Body Dressing: Min guard;Sit to/from stand Lower Body Dressing Details (indicate cue type and reason): able to easily bend to reach B feet for sock mgmt Toilet Transfer: Min guard;Ambulation   Toileting- Clothing Manipulation and Hygiene: Min guard   Tub/ Shower Transfer: Minimal assistance;Tub transfer Tub/Shower Transfer Details (indicate cue type and reason): simulated stepping over tub w/ handheld assist, BUE support required. able to lift LEs pretty high for task Functional mobility during ADLs: Min guard;Rolling walker (2 wheels) General ADL Comments: Minor deficits in endurance and overall strength, new  reliance on DME for mobility for short distances currently     Vision Ability to See in Adequate Light: 0 Adequate Patient Visual Report: No change from baseline Vision Assessment?: No apparent visual deficits     Perception      Praxis      Pertinent Vitals/Pain Pain Assessment Pain Assessment: No/denies pain     Hand Dominance Right   Extremity/Trunk Assessment Upper Extremity Assessment Upper Extremity Assessment: Generalized weakness   Lower Extremity Assessment Lower Extremity Assessment: Defer to PT evaluation   Cervical / Trunk Assessment Cervical / Trunk Assessment: Normal   Communication Communication Communication: No difficulties   Cognition Arousal/Alertness: Awake/alert Behavior During Therapy: WFL for tasks assessed/performed Overall Cognitive Status: Within Functional Limits for tasks assessed                                 General Comments: overall WFL; noted per chart, questionable baseline cognitive impairment     General Comments  Pt's significant other present and supportive    Exercises Exercises: Other exercises Other Exercises Other Exercises: Demonstrated 4/4 UE exercises w/ level 1 theraband with pt able to return demo 2/4 today. significant other present, able to demo 4/4 and plans to assist pt   Shoulder Instructions      Home Living Family/patient expects to be discharged to:: Other (Comment) (boarding house) Living Arrangements: Spouse/significant other;Non-relatives/Friends (4 other people living in house as well) Available Help at Discharge: Family;Available 24 hours/day Type of Home: House Home Access: Level entry     Home Layout: One level     Bathroom Shower/Tub: Teacher, early years/pre: Standard     Home Equipment: Shower seat;Wheelchair Probation officer (4 wheels);Rolling Walker (2 wheels)   Additional Comments: pt resides with boyfriend who works, reports they live in a room in a house, report other people are around but no further information about them is given      Prior Functioning/Environment Prior Level of Function : Independent/Modified Independent             Mobility Comments: Rollator outside of the  home, when walking in park ADLs Comments: Overall MOD I for ADLs, intermittent assist for stepping over tub.        OT Problem List: Decreased strength;Decreased activity tolerance;Impaired balance (sitting and/or standing)      OT Treatment/Interventions: Self-care/ADL training;Therapeutic exercise;Energy conservation;DME and/or AE instruction;Therapeutic activities;Patient/family education    OT Goals(Current goals can be found in the care plan section) Acute Rehab OT Goals Patient Stated Goal: home soon, increase strength OT Goal Formulation: With patient/family Time For Goal Achievement: 11/06/22 Potential to Achieve Goals: Good  OT Frequency: Min 2X/week    Co-evaluation              AM-PAC OT "6 Clicks" Daily Activity     Outcome Measure Help from another person eating meals?: None Help from another person taking care of personal grooming?: A Little Help from another person toileting, which includes using toliet, bedpan, or urinal?: A Little Help from another person bathing (including washing, rinsing, drying)?: A Little Help from another person to put on and taking off regular upper body clothing?: A Little Help from another person to put on and taking off regular lower body clothing?: A Little 6 Click Score: 19   End of Session    Activity Tolerance: Patient tolerated treatment well Patient left: in bed;with call bell/phone within reach;with  bed alarm set;with family/visitor present  OT Visit Diagnosis: Unsteadiness on feet (R26.81);Muscle weakness (generalized) (M62.81)                Time: [id number] OT Time Calculation (min): 24 min Charges:  OT General Charges $OT Visit: 1 Visit OT Evaluation $OT Eval Moderate Complexity: 1 Mod OT Treatments $Self Care/Home Management : 8-22 mins  Malachy Chamber, OTR/L Acute Rehab Services Office: [phone]   Layla Maw 10/23/2022, 2:11 PM

## 2022-10-23 NOTE — Evaluation (Signed)
Physical Therapy Evaluation Patient Details Name: Angel Hebert MRN: [id number] DOB: April 27, 1956 Today's Date: 10/23/2022  History of Present Illness  67 y/o F admitted to [facility] on 2/19 for melena and possible syncopal episode. Pt underwent EGD revelaing nonbleeding duodenal ulcer.  PMHx: alcohol abuse, alcoholic pancreatitis, GIB, GERD, IDA, OA, NSAID use, asthma, HTN, and HLD.  Clinical Impression  Pt presents today with impaired functional mobility limited by cognition, balance, and safety awareness. Pt admitted after syncopal episode but no episodes or reports of dizziness during today's session. Pt reports being modified independent with mobility at baseline, utilizing a rollator primarily with intermittent use of a wheelchair for community distances. Pt requiring minG and cueing throughout session for safety and balance with transfers and ambulation with use of RW. Pt tolerated today's session well, but reports fatigue after ambulation trial, reporting she is typically able to walk further than she did today, VSS on room air. Pt will continue to benefit from skilled acute PT to progress mobility, recommend return home at discharge, pt requesting HHPT. Anticipate pt may progress to no PT needs at discharge but will recommend HHPT at this time for safety in home as well as progressing endurance. Acute PT will continue to follow as appropriate.        Recommendations for follow up therapy are one component of a multi-disciplinary discharge planning process, led by the attending physician.  Recommendations may be updated based on patient status, additional functional criteria and insurance authorization.  Follow Up Recommendations Home health PT      Assistance Recommended at Discharge Intermittent Supervision/Assistance  Patient can return home with the following  A little help with walking and/or transfers;Assistance with cooking/housework;Direct supervision/assist for medications  management;Assist for transportation;Help with stairs or ramp for entrance    Equipment Recommendations None recommended by PT  Recommendations for Other Services       Functional Status Assessment Patient has had a recent decline in their functional status and demonstrates the ability to make significant improvements in function in a reasonable and predictable amount of time.     Precautions / Restrictions Precautions Precautions: Fall Restrictions Weight Bearing Restrictions: No      Mobility  Bed Mobility Overal bed mobility: Modified Independent             General bed mobility comments: HOB elevated and use of bed rails but pt managing without assistance    Transfers Overall transfer level: Needs assistance Equipment used: Rolling walker (2 wheels) Transfers: Sit to/from Stand Sit to Stand: Min guard           General transfer comment: minG provided for safety, pt able to stand from bed and from toilet without physical assist, utilizing grab bar to stand from toilet    Ambulation/Gait Ambulation/Gait assistance: Min guard Gait Distance (Feet): 200 Feet Assistive device: Rolling walker (2 wheels) Gait Pattern/deviations: Step-through pattern, Drifts right/left Gait velocity: decreased     General Gait Details: downward gaze and poor awareness of obstacle negotiation, cueing provided throughout to manage RW around stationary objects in the hallway  Stairs            Wheelchair Mobility    Modified Rankin (Stroke Patients Only)       Balance Overall balance assessment: Needs assistance Sitting-balance support: No upper extremity supported, Feet supported Sitting balance-Leahy Scale: Fair Sitting balance - Comments: stable sitting balance   Standing balance support: Bilateral upper extremity supported, During functional activity Standing balance-Leahy Scale: Poor Standing balance comment: relying  on external support for standing balance and  during ambulation                             Pertinent Vitals/Pain Pain Assessment Pain Assessment: Faces Faces Pain Scale: Hurts a little bit Pain Location: abdomen Pain Descriptors / Indicators: Discomfort Pain Intervention(s): Monitored during session, Limited activity within patient's tolerance    Home Living Family/patient expects to be discharged to:: Other (Comment) (room in home) Living Arrangements: Spouse/significant other Available Help at Discharge: Family;Available PRN/intermittently Type of Home: House Home Access: Level entry       Home Layout: One level Home Equipment: Geographical information systems officer (4 wheels) Additional Comments: pt resides with boyfriend who works, reports they live in a room in a house, report other people are around but no further information about them is given    Prior Function Prior Level of Function : Independent/Modified Independent             Mobility Comments: pt reports she ambualtes with rollator around the home and they utilize a wheelchair in the community, although boyfriend reports ambulating to the store the other day. Pt receives rides from family or transportation services ADLs Comments: pt reports assist from boyfriend for bathing and dressing     Hand Dominance   Dominant Hand: Left    Extremity/Trunk Assessment   Upper Extremity Assessment Upper Extremity Assessment: Defer to OT evaluation    Lower Extremity Assessment Lower Extremity Assessment: Overall WFL for tasks assessed    Cervical / Trunk Assessment Cervical / Trunk Assessment: Normal  Communication   Communication: No difficulties  Cognition Arousal/Alertness: Awake/alert Behavior During Therapy: WFL for tasks assessed/performed Overall Cognitive Status: Impaired/Different from baseline Area of Impairment: Problem solving, Awareness, Safety/judgement, Following commands, Memory                      Memory: Decreased short-term memory Following Commands: Follows one step commands consistently Safety/Judgement: Decreased awareness of safety Awareness: Emergent Problem Solving: Requires verbal cues, Slow processing General Comments: pt A&Ox4, requiring cueing for safety with mobility, especially around obstacles. Pt with dark stool during session and asked throughout session if it was poison, explanation of ulcer provided and medical management        General Comments General comments (skin integrity, edema, etc.): VSS on room air    Exercises     Assessment/Plan    PT Assessment Patient needs continued PT services  PT Problem List Decreased activity tolerance;Decreased balance;Decreased mobility;Decreased cognition;Decreased safety awareness       PT Treatment Interventions DME instruction;Gait training;Functional mobility training;Therapeutic activities;Therapeutic exercise;Balance training;Cognitive remediation;Neuromuscular re-education;Patient/family education    PT Goals (Current goals can be found in the Care Plan section)  Acute Rehab PT Goals Patient Stated Goal: go home PT Goal Formulation: With patient Time For Goal Achievement: 11/06/22 Potential to Achieve Goals: Good    Frequency Min 3X/week     Co-evaluation               AM-PAC PT "6 Clicks" Mobility  Outcome Measure Help needed turning from your back to your side while in a flat bed without using bedrails?: None Help needed moving from lying on your back to sitting on the side of a flat bed without using bedrails?: None Help needed moving to and from a bed to a chair (including a wheelchair)?: A Little Help needed standing up from a chair using your arms (  e.g., wheelchair or bedside chair)?: A Little Help needed to walk in hospital room?: A Little Help needed climbing 3-5 steps with a railing? : A Lot 6 Click Score: 19    End of Session Equipment Utilized During Treatment: Gait  belt Activity Tolerance: Patient tolerated treatment well Patient left: in bed;with call bell/phone within reach;with bed alarm set;with family/visitor present Nurse Communication: Mobility status PT Visit Diagnosis: Difficulty in walking, not elsewhere classified (R26.2)    Time: [id number] PT Time Calculation (min) (ACUTE ONLY): 30 min   Charges:   PT Evaluation $PT Eval Low Complexity: 1 Low PT Treatments $Therapeutic Activity: 8-22 mins        Charlynne Cousins, PT DPT Acute Rehabilitation Services Office [phone]   Luvenia Heller 10/23/2022, 2:09 PM

## 2022-10-23 NOTE — Progress Notes (Signed)
PROGRESS NOTE  Angel Hebert [id number] DOB: December 29, 1955   PCP: Charlott Rakes, MD  Patient is from: Home  DOA: 10/21/2022 LOS: 2  Chief complaints Chief Complaint  Patient presents with   Melena     Brief Narrative / Interim history: 67 year old F with PMH of alcohol abuse, alcoholic pancreatitis, GIB, GERD, IDA, osteoarthritis, NSAID use, asthma, HTN and HLD presenting with possible syncopal episode, epigastric abdominal pain and melanotic stool, and admitted for acute blood loss anemia in the setting of acute GI bleed, and significant electrolyte derangement with hyponatremia, hypomagnesemia, hypokalemia and hypophosphatemia.  Hgb 7.5 (baseline 9-10).  She also had markedly elevated lactic acidosis likely from alcohol.  It seems 2 units of blood ordered but she never been transfused.  GI consulted.  Patient underwent EGD that showed normal stomach (biopsied) and nonbleeding duodenal ulcer with no stigmata of bleeding.  GI recommended continuing PPI drip for 24 hours and changing to twice daily, advancing diet as tolerated, serial H&H, following pathology, avoiding NSAIDs and repeat endoscopy in 8 weeks  Subjective:  Patient was seen and examined, reports BM yesterday, she does report some mild epigastric pain.   Objective: Vitals:   10/23/22 0000 10/23/22 0400 10/23/22 0734 10/23/22 1159  BP: 114/75 112/83  121/88  Pulse:  81  92  Resp: 17 19  18  $ Temp:    97.9 F (36.6 C)  TempSrc:    Axillary  SpO2:  97% 96% 98%  Weight:      Height:        Examination:  Awake Alert, frail, chronically ill-appearing Symmetrical Chest wall movement, Good air movement bilaterally, CTAB RRR,No Gallops,Rubs or new Murmurs, No Parasternal Heave +ve B.Sounds, Abd Soft, mild epigastric tenderness, No rebound - guarding or rigidity. No Cyanosis, Clubbing or edema, No new Rash or bruise     Procedures:  2/20-EGD-normal esophagus, normal stomach (biopsied) and nonbleeding duodenal  ulcer with no stigmata of bleeding.    Microbiology summarized: [id number], influenza and RSV PCR nonreactive.  Assessment and plan: Principal Problem:   Acute GI bleeding Active Problems:   Hyperlipidemia   Essential hypertension   GERD   Hypokalemia   Acute on chronic blood loss anemia   Hyponatremia   Hypomagnesemia   Hyperphosphatemia   Duodenal ulcer  Acute on chronic blood loss anemia due to upper GI bleed:  - Baseline Hgb 9-10.  Reportedly uses naproxen daily for pain.  2 units of blood ordered on admission but she has not received.  EGD shows nonbleeding duodenal ulcer. Recent Labs    10/21/22 0333 10/21/22 1030 10/22/22 0319 10/22/22 1331 10/23/22 0458  HGB 7.5* 8.8* 7.6* 8.0* 7.4*  -GI recommended continuing PPI drip for 24 hours and changing to twice daily, advancing diet as tolerated, serial H&H, following pathology, avoiding NSAIDs and repeat endoscopy in 8 weeks -Transfuse for Hgb less than 7.0.  Hemoglobin stable at 7.9 this morning. -Check anemia panel -Advised to avoid NSAIDs.   Hyponatremia/hypokalemia/hypomagnesemia/hypophosphatemia: Due to alcohol?  Also on Hyzaar. -Phosphorus and magnesium low today, being repleted.   Lactic acidosis: Likely due to alcohol.  Resolved.  Low suspicion for infection.   Essential hypertension: Normotensive off home antihypertensive meds. -Continue holding Hyzaar.  Cognitive impairment?  She is awake and alert but only oriented to self, place and person but not time.  Limited insight -Reorientation and delirium precautions.  EtOH abuse/chronic alcoholic pancreatitis: Reportedly drinks beer daily.  Patient was not able to quantify. -Closely monitor for withdrawal symptoms. -She was  counseled -CIWA with as needed Ativan, multivitamin, folic acid and thiamine  History of asthma: Stable. -Continue home inhalers   Body mass index is 24.21 kg/m.          DVT prophylaxis:  SCDs Start: 10/21/22 [id number]  Code Status:  Full code. Family Communication: None at bedside Level of care: Med-Surg Status is: Inpatient Remains inpatient appropriate because: Acute GI bleed and electrolyte derangement   Final disposition: TBD Consultants:  Gastroenterology  55 minutes with more than 50% spent in reviewing records, counseling patient/family and coordinating care.   Sch Meds:  Scheduled Meds:  sodium chloride   Intravenous Once   folic acid  1 mg Oral Daily   mometasone-formoterol  2 puff Inhalation BID   multivitamin with minerals  1 tablet Oral Daily   thiamine  100 mg Oral Daily   Or   thiamine  100 mg Intravenous Daily   Continuous Infusions:  pantoprazole 8 mg/hr (10/23/22 0300)   PRN Meds:.  Antimicrobials: Anti-infectives (From admission, onward)    None        I have personally reviewed the following labs and images: CBC: Recent Labs  Lab 10/21/22 0333 10/21/22 1030 10/22/22 0319 10/22/22 1331 10/23/22 0458  WBC 5.0  --  6.0  --  4.6  NEUTROABS 3.1  --   --   --   --   HGB 7.5* 8.8* 7.6* 8.0* 7.4*  HCT 21.3* 24.6* 21.7* 22.6* 22.7*  MCV 98.2  --  90.0  --  97.0  PLT 153  --  194  --  204   BMP &GFR Recent Labs  Lab 10/21/22 0333 10/21/22 1025 10/22/22 0319 10/23/22 0458  NA 122*  --  131* 133*  K 2.4*  --  3.3* 4.3  CL 88*  --  100 105  CO2 22  --  23 18*  GLUCOSE 189*  --  90 89  BUN 34*  --  15 7*  CREATININE 0.93  --  0.65 0.71  CALCIUM 7.8*  --  8.0* 8.3*  MG  --  1.1* 2.1 1.6*  PHOS  --  11.1* 2.0* 2.3*   Estimated Creatinine Clearance: 65.6 mL/min (by C-G formula based on SCr of 0.71 mg/dL). Liver & Pancreas: Recent Labs  Lab 10/21/22 0333 10/22/22 0319 10/23/22 0458  AST 33 24 29  ALT 10 8 11  $ ALKPHOS 50 46 47  BILITOT 0.5 0.7 0.5  PROT 5.1* 5.2* 5.1*  ALBUMIN 2.3* 2.3* 2.5*   Recent Labs  Lab 10/21/22 0333  LIPASE 37   No results for input(s): "AMMONIA" in the last 168 hours. Diabetic: No results for input(s): "HGBA1C" in the last 72  hours. No results for input(s): "GLUCAP" in the last 168 hours. Cardiac Enzymes: No results for input(s): "CKTOTAL", "CKMB", "CKMBINDEX", "TROPONINI" in the last 168 hours. No results for input(s): "PROBNP" in the last 8760 hours. Coagulation Profile: Recent Labs  Lab 10/21/22 0333  INR 1.1   Thyroid Function Tests: No results for input(s): "TSH", "T4TOTAL", "FREET4", "T3FREE", "THYROIDAB" in the last 72 hours. Lipid Profile: No results for input(s): "CHOL", "HDL", "LDLCALC", "TRIG", "CHOLHDL", "LDLDIRECT" in the last 72 hours. Anemia Panel: Recent Labs    10/22/22 1002  VITAMINB12 347  FOLATE 9.9  FERRITIN 135  TIBC 265  IRON 112  RETICCTPCT 2.3   Urine analysis:    Component Value Date/Time   COLORURINE YELLOW 07/25/2021 1000   APPEARANCEUR TURBID (A) 07/25/2021 1000   LABSPEC <1.005 (L) 07/25/2021  1000   PHURINE 7.0 07/25/2021 1000   GLUCOSEU NEGATIVE 07/25/2021 1000   HGBUR MODERATE (A) 07/25/2021 1000   BILIRUBINUR SMALL (A) 07/25/2021 1000   KETONESUR NEGATIVE 07/25/2021 1000   PROTEINUR NEGATIVE 07/25/2021 1000   UROBILINOGEN 0.2 10/08/2012 1224   NITRITE NEGATIVE 07/25/2021 1000   LEUKOCYTESUR LARGE (A) 07/25/2021 1000   Sepsis Labs: Invalid input(s): "PROCALCITONIN", "LACTICIDVEN"  Microbiology: Recent Results (from the past 240 hour(s))  Resp panel by RT-PCR (RSV, Flu A&B, Covid) Anterior Nasal Swab     Status: None   Collection Time: 10/21/22  5:15 AM   Specimen: Anterior Nasal Swab  Result Value Ref Range Status   SARS Coronavirus 2 by RT PCR NEGATIVE NEGATIVE Final   Influenza A by PCR NEGATIVE NEGATIVE Final   Influenza B by PCR NEGATIVE NEGATIVE Final    Comment: (NOTE) The Xpert Xpress SARS-CoV-2/FLU/RSV plus assay is intended as an aid in the diagnosis of influenza from Nasopharyngeal swab specimens and should not be used as a sole basis for treatment. Nasal washings and aspirates are unacceptable for Xpert Xpress  SARS-CoV-2/FLU/RSV testing.  Fact Sheet for Patients: EntrepreneurPulse.com.au  Fact Sheet for Healthcare Providers: IncredibleEmployment.be  This test is not yet approved or cleared by the [place] FDA and has been authorized for detection and/or diagnosis of SARS-CoV-2 by FDA under an Emergency Use Authorization (EUA). This EUA will remain in effect (meaning this test can be used) for the duration of the COVID-19 declaration under Section 564(b)(1) of the Act, 21 U.S.C. section 360bbb-3(b)(1), unless the authorization is terminated or revoked.     Resp Syncytial Virus by PCR NEGATIVE NEGATIVE Final    Comment: (NOTE) Fact Sheet for Patients: EntrepreneurPulse.com.au  Fact Sheet for Healthcare Providers: IncredibleEmployment.be  This test is not yet approved or cleared by the [place] FDA and has been authorized for detection and/or diagnosis of SARS-CoV-2 by FDA under an Emergency Use Authorization (EUA). This EUA will remain in effect (meaning this test can be used) for the duration of the COVID-19 declaration under Section 564(b)(1) of the Act, 21 U.S.C. section 360bbb-3(b)(1), unless the authorization is terminated or revoked.  Performed at [facility], [place] [address], [place], [place] [address]     Radiology Studies: No results found.    Phillips Climes MD [facility]  If 7PM-7AM, please contact night-coverage [email or web address] 10/23/2022, 1:11 PM

## 2022-10-23 NOTE — TOC Progression Note (Signed)
Transition of Care [facility]) - Progression Note    Patient Details  Name: Angel Hebert MRN: [id number] Date of Birth: Jul 02, 1956  Transition of Care [facility]) CM/SW [place], [place] Phone Number: 10/23/2022, 2:41 PM  Clinical Narrative:     CSW met with pt in response to [facility] consult for substance use. Pt agreeable to discuss with husband present. She states she drinks one 40oz beer per day and denies any other substance use. She states the doctor told her she needed to stop drinking and expressed an understanding that it negatively impacts her health. She plans to stop drinking and husband is supportive of this. He states he plans to stop drinking as well. CSW provides substance use tx resource list. Pt and spouse are familiar with AA meeting locations if needed as well.         Social Determinants of Health (SDOH) Interventions SDOH Screenings   Depression (PHQ2-9): Low Risk  (08/22/2021)  Tobacco Use: Low Risk  (10/22/2022)    Readmission Risk Interventions    04/18/2020    2:10 PM  Readmission Risk Prevention Plan  Post Dischage Appt Complete  Medication Screening Complete  Transportation Screening Complete

## 2022-10-23 NOTE — Discharge Instructions (Signed)
Please go to [facility] Gastroenterology office at [place],  [id number] on 10/30/22 between the hours of 7:30 am and 4:00 pm to have labs drawn. Our lab is located in the basement of the building.

## 2022-10-23 NOTE — Progress Notes (Signed)
Daily Progress Note  Hospital Day: 3  Chief Complaint: GI bleed   Assessment   Brief Narrative:  Angel Hebert is a 67 y.o. female with a pmh not limited to ETOH abuse, adenomatous colon polyps, diverticulosis, hypertension, hyperlipidemia, asthma, Etoh abuse, GERD, bilateral knee osteoarthritis,  CHF (EF 39 to 50%)   # 67 yo female with severe anemia ( ? Acute on chronic)  / reported black stool and elevated BUN in setting of recent Aleve.  HGb 7.5, down from 9.9 in December 2022. Baseline hgb unclear. Received 2 u PRBCs with no improvement in hgb which is 7.4 today.  Not iron deficient by labs.   # History of adenomatous colon polyps in 2017. Due for surveillance colonoscopy    # History of Etoh abuse. No reported cirrhosis / steatosis on CTA . Normal INR. Normal platelet count. Has hypoalbuminemia but could be nutritional.    Plan   Avoid NSAIDs Repeat EGD in 8 weeks to check for healing. Can do polyp surveillance colonoscopy at the same time.  Continue BID PPI Gastric biopsies pending CBC at our office in one week - see discharge instructions Follow up appt with me on 3/26 at 10 am.   Subjective    Had a black BM a short while ago.    Objective   Endoscopic studies:   EGD  10/22/22 Normal esophagus. Normal stomach, biopsied. Non-bleeding duodenal ulcer with no stigmata of bleeding.   Imaging:  CT ANGIO GI BLEED  Result Date: 10/21/2022 CLINICAL DATA:  Hemorrhage from the rectum. EXAM: CTA ABDOMEN AND PELVIS WITHOUT AND WITH CONTRAST TECHNIQUE: Multidetector CT imaging of the abdomen and pelvis was performed using the standard protocol during bolus administration of intravenous contrast. Multiplanar reconstructed images and MIPs were obtained and reviewed to evaluate the vascular anatomy. RADIATION DOSE REDUCTION: This exam was performed according to the departmental dose-optimization program which includes automated exposure control, adjustment of the mA  and/or kV according to patient size and/or use of iterative reconstruction technique. CONTRAST:  160m OMNIPAQUE IOHEXOL 350 MG/ML SOLN COMPARISON:  05/22/2021 FINDINGS: VASCULAR Suboptimal arterial opacification due to timing or interruption of contrast based on right heart opacification. Aorta: Atheromatous plaque.  No aneurysm or dissection Celiac: No branch occlusion, beading, or narrowing. SMA: No emergent finding or flow limiting stenosis Renals: Symmetric enhancement without evidence of stenosis IMA: Patent Inflow: Scattered atheromatous calcified plaques. Proximal Outflow: Negative Veins: Negative Review of the MIP images confirms the above findings. NON-VASCULAR Lower chest: Scarring or atelectasis in the left lower lung. Coronary atherosclerosis. Hepatobiliary: No focal liver abnormality. Small volume Litt tubular gas at the left lobe liver, likely pneumobilia in usually related to prior intervention. No gas seen within the portal venous system. No evidence of biliary obstruction or stone. Pancreas: Unremarkable. Spleen: Unremarkable. Adrenals/Urinary Tract: Negative adrenals. No hydronephrosis or stone. Unremarkable bladder. Stomach/Bowel: No evidence of active intraluminal bleeding. No bowel obstruction or inflammation. Lymphatic: No mass or adenopathy. Reproductive:Symmetric appearance of the adnexa.  Hysterectomy. Other: No ascites or pneumoperitoneum. Musculoskeletal: No acute abnormalities. Metallic foreign body at the right iliac fossa. Lumbar spine degeneration with L2 and L3 chronic superior endplate fractures. IMPRESSION: VASCULAR 1. No localized source of bleeding.  No sign of active GI bleeding. 2. Generalized atherosclerosis. NON-VASCULAR Mild pneumobilia, correlate for sphincterotomy history. Otherwise stable exam since 2022. Electronically Signed   By: JJorje GuildM.D.   On: 10/21/2022 06:16   DG Chest Portable 1 View  Result Date: 10/21/2022 CLINICAL  DATA:  aloc.  syncope EXAM:  PORTABLE CHEST 1 VIEW COMPARISON:  Chest x-ray 04/16/2020, CT angio chest 07/13/2019 FINDINGS: The heart and mediastinal contours are within normal limits. Aortic calcification. No focal consolidation. Bibasilar interstitial markings. No pleural effusion. No pneumothorax. No acute osseous abnormality. IMPRESSION: 1. Bibasilar interstitial markings. Query underlying infection or inflammation. 2.  Aortic Atherosclerosis (ICD10-I70.0). Electronically Signed   By: Iven Finn M.D.   On: 10/21/2022 03:59    Lab Results: Recent Labs    10/21/22 0333 10/21/22 1030 10/22/22 0319 10/22/22 1331 10/23/22 0458  WBC 5.0  --  6.0  --  4.6  HGB 7.5*   < > 7.6* 8.0* 7.4*  HCT 21.3*   < > 21.7* 22.6* 22.7*  PLT 153  --  194  --  204   < > = values in this interval not displayed.   BMET Recent Labs    10/21/22 0333 10/22/22 0319 10/23/22 0458  NA 122* 131* 133*  K 2.4* 3.3* 4.3  CL 88* 100 105  CO2 22 23 18*  GLUCOSE 189* 90 89  BUN 34* 15 7*  CREATININE 0.93 0.65 0.71  CALCIUM 7.8* 8.0* 8.3*   LFT Recent Labs    10/23/22 0458  PROT 5.1*  ALBUMIN 2.5*  AST 29  ALT 11  ALKPHOS 47  BILITOT 0.5   PT/INR Recent Labs    10/21/22 0333  LABPROT 14.4  INR 1.1     Scheduled inpatient medications:   sodium chloride   Intravenous Once   folic acid  1 mg Oral Daily   mometasone-formoterol  2 puff Inhalation BID   multivitamin with minerals  1 tablet Oral Daily   [START ON 10/24/2022] pantoprazole  40 mg Oral BID   thiamine  100 mg Oral Daily   Or   thiamine  100 mg Intravenous Daily   Continuous inpatient infusions:   pantoprazole 8 mg/hr (10/23/22 0300)   sodium phosphate 15 mmol in dextrose 5 % 250 mL infusion     PRN inpatient medications: albuterol, HYDROcodone-acetaminophen, LORazepam **OR** LORazepam  Vital signs in last 24 hours: Temp:  [97.9 F (36.6 C)] 97.9 F (36.6 C) (02/21 1159) Pulse Rate:  [81-92] 92 (02/21 1159) Resp:  [17-19] 18 (02/21 1159) BP:  (112-125)/(70-88) 121/88 (02/21 1159) SpO2:  [94 %-98 %] 98 % (02/21 1159) Last BM Date : 10/21/22  Intake/Output Summary (Last 24 hours) at 10/23/2022 1557 Last data filed at 10/22/2022 1620 Gross per 24 hour  Intake 100 ml  Output --  Net 100 ml    Intake/Output from previous day: 02/20 0701 - 02/21 0700 In: 400 [P.O.:100; I.V.:300] Out: -  Intake/Output this shift: No intake/output data recorded.   Physical Exam:  General: Alert female in NAD Heart:  Regular rate and rhythm.  Pulmonary: Normal respiratory effort Abdomen: Soft, nondistended, nontender. Normal bowel sounds. Neurologic: Alert and oriented Psych: Pleasant. Cooperative.    Principal Problem:   Acute GI bleeding Active Problems:   Hyperlipidemia   Essential hypertension   GERD   Hypokalemia   Acute on chronic blood loss anemia   Hyponatremia   Hypomagnesemia   Hyperphosphatemia   Duodenal ulcer     LOS: 2 days   Tye Savoy ,NP 10/23/2022, 3:57 PM

## 2022-10-24 DIAGNOSIS — D62 Acute posthemorrhagic anemia: Secondary | ICD-10-CM | POA: Diagnosis not present

## 2022-10-24 DIAGNOSIS — K922 Gastrointestinal hemorrhage, unspecified: Secondary | ICD-10-CM | POA: Diagnosis not present

## 2022-10-24 LAB — SURGICAL PATHOLOGY

## 2022-10-24 LAB — CBC
HCT: 21.9 % — ABNORMAL LOW (ref 36.0–46.0)
Hemoglobin: 7.1 g/dL — ABNORMAL LOW (ref 12.0–15.0)
MCH: 31.7 pg (ref 26.0–34.0)
MCHC: 32.4 g/dL (ref 30.0–36.0)
MCV: 97.8 fL (ref 80.0–100.0)
Platelets: 251 10*3/uL (ref 150–400)
RBC: 2.24 MIL/uL — ABNORMAL LOW (ref 3.87–5.11)
RDW: 17 % — ABNORMAL HIGH (ref 11.5–15.5)
WBC: 6.3 10*3/uL (ref 4.0–10.5)
nRBC: 0 % (ref 0.0–0.2)

## 2022-10-24 LAB — PREPARE RBC (CROSSMATCH)

## 2022-10-24 MED ORDER — SODIUM CHLORIDE 0.9% IV SOLUTION: Freq: Once | INTRAVENOUS | Status: AC

## 2022-10-24 NOTE — Progress Notes (Signed)
Physical Therapy Treatment Patient Details Name: Angel Hebert MRN: [id number] DOB: Mar 07, 1956 Today's Date: 10/24/2022   History of Present Illness 67 y/o F admitted to [facility] on 2/19 for melena and possible syncopal episode. Pt underwent EGD revelaing nonbleeding duodenal ulcer. PMHx: alcohol abuse, alcoholic pancreatitis, GIB, GERD, IDA, OA, NSAID use, asthma, HTN, and HLD.    PT Comments    Pt agreeable to session with encouragement and demonstrating continued progress towards acute goals. Pt able to complete bed mobility at mod I level with slightly increased time and cues for attention to task. Pt demonstrating functional transfers and gait with min guard assist for safety and RW for support. Pt with improved environmental awareness this date with no instances of running into objects in hall or room. Pt continues to demonstrate some decreased safety awareness and decreased awareness of deficits. Current plan remains appropriate to address deficits and maximize functional independence and safety. Pt continues to benefit from skilled PT services to progress toward functional mobility goals.    Recommendations for follow up therapy are one component of a multi-disciplinary discharge planning process, led by the attending physician.  Recommendations may be updated based on patient status, additional functional criteria and insurance authorization.  Follow Up Recommendations  Home health PT     Assistance Recommended at Discharge Intermittent Supervision/Assistance  Patient can return home with the following A little help with walking and/or transfers;Assistance with cooking/housework;Direct supervision/assist for medications management;Assist for transportation;Help with stairs or ramp for entrance   Equipment Recommendations  None recommended by PT    Recommendations for Other Services       Precautions / Restrictions Precautions Precautions: Fall Restrictions Weight Bearing  Restrictions: No     Mobility  Bed Mobility Overal bed mobility: Modified Independent             General bed mobility comments: HOB elevated and use of bed rails but pt managing without assistance    Transfers Overall transfer level: Needs assistance Equipment used: None Transfers: Sit to/from Stand Sit to Stand: Min guard           General transfer comment: able to stand without AD but requires at least one UE support with dynamic tasks    Ambulation/Gait Ambulation/Gait assistance: Min guard Gait Distance (Feet): 275 Feet Assistive device: Rolling walker (2 wheels) Gait Pattern/deviations: Step-through pattern, Drifts right/left Gait velocity: decreased     General Gait Details: improved posture, pt with improved obstacle negotiation and no instances of running into any, HR up to 133bpm at end of ambulation distance   Stairs             Wheelchair Mobility    Modified Rankin (Stroke Patients Only)       Balance Overall balance assessment: Needs assistance Sitting-balance support: No upper extremity supported, Feet supported Sitting balance-Leahy Scale: Good Sitting balance - Comments: stable sitting balance   Standing balance support: No upper extremity supported, Single extremity supported, During functional activity Standing balance-Leahy Scale: Fair Standing balance comment: at least one UE support for dynamic tasks needed                            Cognition Arousal/Alertness: Awake/alert Behavior During Therapy: WFL for tasks assessed/performed Overall Cognitive Status: Within Functional Limits for tasks assessed  General Comments: overall WFL; noted per chart, questionable baseline cognitive impairment        Exercises      General Comments        Pertinent Vitals/Pain Pain Assessment Pain Assessment: No/denies pain Pain Intervention(s): Monitored during session     Home Living                          Prior Function            PT Goals (current goals can now be found in the care plan section) Acute Rehab PT Goals Patient Stated Goal: go home PT Goal Formulation: With patient Time For Goal Achievement: 11/06/22 Progress towards PT goals: Progressing toward goals    Frequency    Min 3X/week      PT Plan      Co-evaluation              AM-PAC PT "6 Clicks" Mobility   Outcome Measure  Help needed turning from your back to your side while in a flat bed without using bedrails?: None Help needed moving from lying on your back to sitting on the side of a flat bed without using bedrails?: None Help needed moving to and from a bed to a chair (including a wheelchair)?: A Little Help needed standing up from a chair using your arms (e.g., wheelchair or bedside chair)?: A Little Help needed to walk in hospital room?: A Little Help needed climbing 3-5 steps with a railing? : A Lot 6 Click Score: 19    End of Session   Activity Tolerance: Patient tolerated treatment well Patient left: in bed;with call bell/phone within reach Nurse Communication: Mobility status PT Visit Diagnosis: Difficulty in walking, not elsewhere classified (R26.2)     Time: [id number] PT Time Calculation (min) (ACUTE ONLY): 16 min  Charges:  $Therapeutic Activity: 8-22 mins                    Saladin Petrelli R. PTA Acute Rehabilitation Services Office: [place] 10/24/2022, 3:37 PM

## 2022-10-24 NOTE — Progress Notes (Signed)
PROGRESS NOTE  Angel Hebert [id number] DOB: Jun 20, 1956   PCP: Charlott Rakes, MD  Patient is from: Home  DOA: 10/21/2022 LOS: 3  Chief complaints Chief Complaint  Patient presents with   Melena     Brief Narrative / Interim history: 67 year old F with PMH of alcohol abuse, alcoholic pancreatitis, GIB, GERD, IDA, osteoarthritis, NSAID use, asthma, HTN and HLD presenting with possible syncopal episode, epigastric abdominal pain and melanotic stool, and admitted for acute blood loss anemia in the setting of acute GI bleed, and significant electrolyte derangement with hyponatremia, hypomagnesemia, hypokalemia and hypophosphatemia.  Hgb 7.5 (baseline 9-10).  She also had markedly elevated lactic acidosis likely from alcohol.  It seems 2 units of blood ordered but she never been transfused.  GI consulted.  Patient underwent EGD that showed normal stomach (biopsied) and nonbleeding duodenal ulcer with no stigmata of bleeding.  GI recommended continuing PPI drip for 24 hours and changing to twice daily, advancing diet as tolerated, serial H&H, following pathology, avoiding NSAIDs and repeat endoscopy in 8 weeks  Subjective:  Patient was seen and examined, denies any abdominal pain today.     Objective: Vitals:   10/23/22 2043 10/24/22 0028 10/24/22 0432 10/24/22 0800  BP: 121/81 110/62 121/69 122/73  Pulse: (!) 109 94 93 94  Resp: 14 14 (!) 23 17  Temp: 98.1 F (36.7 C) 98 F (36.7 C) 98.8 F (37.1 C) 98.1 F (36.7 C)  TempSrc: Oral Oral Oral Oral  SpO2:      Weight:      Height:        Examination:  Awake Alert, Oriented X 3, frail, deconditioned. Symmetrical Chest wall movement, Good air movement bilaterally, CTAB RRR,No Gallops,Rubs or new Murmurs, No Parasternal Heave +ve B.Sounds, Abd Soft, No tenderness, No rebound - guarding or rigidity. No Cyanosis, Clubbing or edema, No new Rash or bruise     Procedures:  2/20-EGD-normal esophagus, normal stomach  (biopsied) and nonbleeding duodenal ulcer with no stigmata of bleeding.    Microbiology summarized: [id number], influenza and RSV PCR nonreactive.  Assessment and plan: Principal Problem:   Acute GI bleeding Active Problems:   Hyperlipidemia   Essential hypertension   GERD   Hypokalemia   Acute on chronic blood loss anemia   Hyponatremia   Hypomagnesemia   Hyperphosphatemia   Duodenal ulcer  Acute on chronic blood loss anemia due to upper GI bleed:  - Baseline Hgb 9-10.  Reportedly uses naproxen daily for pain.  2 units of blood ordered on admission but she has not received.  EGD shows nonbleeding duodenal ulcer. Recent Labs    10/21/22 0333 10/21/22 1030 10/22/22 0319 10/22/22 1331 10/23/22 0458 10/24/22 0807  HGB 7.5* 8.8* 7.6* 8.0* 7.4* 7.1*  -GI recommended continuing PPI drip for 24 hours and changing to twice daily, advancing diet as tolerated, serial H&H, following pathology, avoiding NSAIDs and repeat endoscopy in 8 weeks -Check anemia panel -Advised to avoid NSAIDs. -Globin dropped to 7.1 this morning, she has no further evidence of GI bleed, but I will go ahead and transfuse 1 unit PRBC and monitor closely for GI bleed.   Hyponatremia/hypokalemia/hypomagnesemia/hypophosphatemia: Due to alcohol?  Also on Hyzaar. -Phosphorus and magnesium low today, being repleted.   Lactic acidosis: Likely due to alcohol.  Resolved.  Low suspicion for infection.   Essential hypertension: Normotensive off home antihypertensive meds. -Continue holding Hyzaar.  Cognitive impairment?  She is awake and alert but only oriented to self, place and person but not time.  Limited insight -Reorientation and delirium precautions.  EtOH abuse/chronic alcoholic pancreatitis: Reportedly drinks beer daily.  Patient was not able to quantify. -Closely monitor for withdrawal symptoms. -She was counseled -CIWA with as needed Ativan, multivitamin, folic acid and thiamine  History of asthma:  Stable. -Continue home inhalers   Body mass index is 24.21 kg/m.          DVT prophylaxis:  SCDs Start: 10/21/22 [id number]  Code Status: Full code. Family Communication: Discussed with husband at bedside Level of care: Med-Surg Status is: Inpatient Remains inpatient appropriate because: Acute GI bleed and electrolyte derangement   Final disposition: Home Consultants:  Gastroenterology    Sch Meds:  Scheduled Meds:  sodium chloride   Intravenous Once   sodium chloride   Intravenous Once   folic acid  1 mg Oral Daily   mometasone-formoterol  2 puff Inhalation BID   multivitamin with minerals  1 tablet Oral Daily   pantoprazole  40 mg Oral BID   thiamine  100 mg Oral Daily   Or   thiamine  100 mg Intravenous Daily   Continuous Infusions:   PRN Meds:.  Antimicrobials: Anti-infectives (From admission, onward)    None        I have personally reviewed the following labs and images: CBC: Recent Labs  Lab 10/21/22 0333 10/21/22 1030 10/22/22 0319 10/22/22 1331 10/23/22 0458 10/24/22 0807  WBC 5.0  --  6.0  --  4.6 6.3  NEUTROABS 3.1  --   --   --   --   --   HGB 7.5* 8.8* 7.6* 8.0* 7.4* 7.1*  HCT 21.3* 24.6* 21.7* 22.6* 22.7* 21.9*  MCV 98.2  --  90.0  --  97.0 97.8  PLT 153  --  194  --  204 251   BMP &GFR Recent Labs  Lab 10/21/22 0333 10/21/22 1025 10/22/22 0319 10/23/22 0458  NA 122*  --  131* 133*  K 2.4*  --  3.3* 4.3  CL 88*  --  100 105  CO2 22  --  23 18*  GLUCOSE 189*  --  90 89  BUN 34*  --  15 7*  CREATININE 0.93  --  0.65 0.71  CALCIUM 7.8*  --  8.0* 8.3*  MG  --  1.1* 2.1 1.6*  PHOS  --  11.1* 2.0* 2.3*   Estimated Creatinine Clearance: 65.6 mL/min (by C-G formula based on SCr of 0.71 mg/dL). Liver & Pancreas: Recent Labs  Lab 10/21/22 0333 10/22/22 0319 10/23/22 0458  AST 33 24 29  ALT 10 8 11  $ ALKPHOS 50 46 47  BILITOT 0.5 0.7 0.5  PROT 5.1* 5.2* 5.1*  ALBUMIN 2.3* 2.3* 2.5*   Recent Labs  Lab 10/21/22 0333   LIPASE 37   No results for input(s): "AMMONIA" in the last 168 hours. Diabetic: No results for input(s): "HGBA1C" in the last 72 hours. No results for input(s): "GLUCAP" in the last 168 hours. Cardiac Enzymes: No results for input(s): "CKTOTAL", "CKMB", "CKMBINDEX", "TROPONINI" in the last 168 hours. No results for input(s): "PROBNP" in the last 8760 hours. Coagulation Profile: Recent Labs  Lab 10/21/22 0333  INR 1.1   Thyroid Function Tests: No results for input(s): "TSH", "T4TOTAL", "FREET4", "T3FREE", "THYROIDAB" in the last 72 hours. Lipid Profile: No results for input(s): "CHOL", "HDL", "LDLCALC", "TRIG", "CHOLHDL", "LDLDIRECT" in the last 72 hours. Anemia Panel: Recent Labs    10/22/22 1002  VITAMINB12 347  FOLATE 9.9  FERRITIN 135  TIBC  265  IRON 112  RETICCTPCT 2.3   Urine analysis:    Component Value Date/Time   COLORURINE YELLOW 07/25/2021 1000   APPEARANCEUR TURBID (A) 07/25/2021 1000   LABSPEC <1.005 (L) 07/25/2021 1000   PHURINE 7.0 07/25/2021 1000   GLUCOSEU NEGATIVE 07/25/2021 1000   HGBUR MODERATE (A) 07/25/2021 1000   BILIRUBINUR SMALL (A) 07/25/2021 1000   KETONESUR NEGATIVE 07/25/2021 1000   PROTEINUR NEGATIVE 07/25/2021 1000   UROBILINOGEN 0.2 10/08/2012 1224   NITRITE NEGATIVE 07/25/2021 1000   LEUKOCYTESUR LARGE (A) 07/25/2021 1000   Sepsis Labs: Invalid input(s): "PROCALCITONIN", "LACTICIDVEN"  Microbiology: Recent Results (from the past 240 hour(s))  Resp panel by RT-PCR (RSV, Flu A&B, Covid) Anterior Nasal Swab     Status: None   Collection Time: 10/21/22  5:15 AM   Specimen: Anterior Nasal Swab  Result Value Ref Range Status   SARS Coronavirus 2 by RT PCR NEGATIVE NEGATIVE Final   Influenza A by PCR NEGATIVE NEGATIVE Final   Influenza B by PCR NEGATIVE NEGATIVE Final    Comment: (NOTE) The Xpert Xpress SARS-CoV-2/FLU/RSV plus assay is intended as an aid in the diagnosis of influenza from Nasopharyngeal swab specimens and should  not be used as a sole basis for treatment. Nasal washings and aspirates are unacceptable for Xpert Xpress SARS-CoV-2/FLU/RSV testing.  Fact Sheet for Patients: EntrepreneurPulse.com.au  Fact Sheet for Healthcare Providers: IncredibleEmployment.be  This test is not yet approved or cleared by the [place] FDA and has been authorized for detection and/or diagnosis of SARS-CoV-2 by FDA under an Emergency Use Authorization (EUA). This EUA will remain in effect (meaning this test can be used) for the duration of the COVID-19 declaration under Section 564(b)(1) of the Act, 21 U.S.C. section 360bbb-3(b)(1), unless the authorization is terminated or revoked.     Resp Syncytial Virus by PCR NEGATIVE NEGATIVE Final    Comment: (NOTE) Fact Sheet for Patients: EntrepreneurPulse.com.au  Fact Sheet for Healthcare Providers: IncredibleEmployment.be  This test is not yet approved or cleared by the [place] FDA and has been authorized for detection and/or diagnosis of SARS-CoV-2 by FDA under an Emergency Use Authorization (EUA). This EUA will remain in effect (meaning this test can be used) for the duration of the COVID-19 declaration under Section 564(b)(1) of the Act, 21 U.S.C. section 360bbb-3(b)(1), unless the authorization is terminated or revoked.  Performed at [facility], [place] [address], [place], [place] [address]     Radiology Studies: No results found.    Phillips Climes MD [facility]  If 7PM-7AM, please contact night-coverage [email or web address] 10/24/2022, 2:42 PM

## 2022-10-24 NOTE — TOC Initial Note (Signed)
Transition of Care [facility]) - Initial/Assessment Note    Patient Details  Name: Angel Hebert MRN: [id number] Date of Birth: [birth year]  Transition of Care [facility]) CM/SW Contact:    Pollie Friar, RN Phone Number: 10/24/2022, 2:54 PM  Clinical Narrative:                 Pt is from home with her boyfriend. She states he is with her most of the time. He provides needed transportation and manages her medications.  Home health recommended. She has no preference for [facility] agency. [place] arranged with [facility]. Information on the AVS. They will contact her for the first home visit.  Pt has transport home when medically ready.  Expected Discharge Plan: [place] Barriers to Discharge: Continued Medical Work up   Patient Goals and CMS Choice   CMS Medicare.gov Compare Post Acute Care list provided to:: Patient Choice offered to / list presented to : Patient      Expected Discharge Plan and Services   Discharge Planning Services: CM Consult Post Acute Care Choice: [place] arrangements for the past 2 months: [place]: PT [place]: [place] Date [place]: 10/24/22   Representative spoke with at [place]: Tommi Rumps  Prior Living Arrangements/Services Living arrangements for the past 2 months: [place] Lives with:: Significant Other Patient language and need for interpreter reviewed:: Yes Do you feel safe going back to the place where you live?: Yes          Current home services: DME (shower seat/ wheelchair/ rollator) Criminal Activity/Legal Involvement Pertinent to Current Situation/Hospitalization: No - Comment as needed  Activities of Daily Living      Permission Sought/Granted                  Emotional Assessment Appearance:: Appears stated age Attitude/Demeanor/Rapport: Engaged Affect (typically observed): Accepting Orientation: : Oriented to Self,  Oriented to Place, Oriented to Situation   Psych Involvement: No (comment)  Admission diagnosis:  GI bleed [K92.2] Acute GI bleeding [K92.2] Gastrointestinal hemorrhage, unspecified gastrointestinal hemorrhage type [K92.2] Patient Active Problem List   Diagnosis Date Noted   Duodenal ulcer 10/22/2022   Acute GI bleeding 10/21/2022   GI bleed 10/21/2022   Protein-calorie malnutrition, severe ([place]) 10/21/2022   Acute on chronic blood loss anemia 10/21/2022   Hyponatremia 10/21/2022   Hypomagnesemia 10/21/2022   Hyperphosphatemia 10/21/2022   AKI (acute kidney injury) ([place])    Alcohol induced acute pancreatitis 07/26/2021   Gastritis 07/25/2021   Periprosthetic fracture around internal prosthetic left knee joint 04/17/2020   Closed fracture of left distal femur ([place]) 04/16/2020   Symptomatic anemia 07/12/2019   Hypokalemia 07/12/2019   Lobar pneumonia ([place]) 07/12/2019   Insomnia 03/03/2017   History of total knee arthroplasty, left 11/27/2016   Arthritis of knee 10/01/2016   Chronic pansinusitis 09/05/2016   Physical deconditioning    Anemia, iron deficiency    Pressure ulcer 10/14/2015   CAP (community acquired pneumonia)    Sepsis due to Streptococcus pneumoniae ([place])    Hyperlipidemia 03/02/2007   Essential hypertension 03/02/2007   Allergic rhinitis 03/02/2007   Asthma 03/02/2007   GERD 03/02/2007   PCP:  Charlott Rakes, MD Pharmacy:   [id number] [address]  [place] [address] Phone: [phone] Fax: [place] [address] [place] [address] Phone: [phone] Fax: [phone]     Social Determinants of Health (SDOH) Social History: SDOH Screenings   Depression (PHQ2-9): Low Risk  (08/22/2021)  Tobacco Use: Low Risk  (10/23/2022)   SDOH Interventions:     Readmission Risk Interventions    04/18/2020     2:10 PM  Readmission Risk Prevention Plan  Post Dischage Appt Complete  Medication Screening Complete  Transportation Screening Complete

## 2022-10-24 NOTE — Progress Notes (Signed)
Mobility Specialist Progress Note   10/24/22 1300  Mobility  Activity Ambulated with assistance in room  Level of Assistance Contact guard assist, steadying assist  Assistive Device Front wheel walker  Distance Ambulated (ft) 30 ft  Range of Motion/Exercises Active;All extremities  Activity Response Tolerated well   Patient received in supine and agreeable to participate. Deferred hallway ambulation for unspecified reasons. Was independent for bed mobility and ambulated in room, supervision to min guard with steady gait. Returned to bed without complaint or incident. Was left in supine with all needs met, call bell in reach.   Angel Hebert, BS EXP Mobility Specialist Please contact via SecureChat or Rehab office at [phone]

## 2022-10-25 ENCOUNTER — Other Ambulatory Visit (HOSPITAL_COMMUNITY): Payer: Self-pay

## 2022-10-25 LAB — CBC
HCT: 25.7 % — ABNORMAL LOW (ref 36.0–46.0)
Hemoglobin: 8.7 g/dL — ABNORMAL LOW (ref 12.0–15.0)
MCH: 32.6 pg (ref 26.0–34.0)
MCHC: 33.9 g/dL (ref 30.0–36.0)
MCV: 96.3 fL (ref 80.0–100.0)
Platelets: 275 10*3/uL (ref 150–400)
RBC: 2.67 MIL/uL — ABNORMAL LOW (ref 3.87–5.11)
RDW: 16.9 % — ABNORMAL HIGH (ref 11.5–15.5)
WBC: 6.4 10*3/uL (ref 4.0–10.5)
nRBC: 0 % (ref 0.0–0.2)

## 2022-10-25 LAB — BPAM RBC
Blood Product Expiration Date: 202402252359
Blood Product Expiration Date: 202403052359
Blood Product Expiration Date: 202403062359
Blood Product Expiration Date: 202403242359
ISSUE DATE / TIME: 202402190541
ISSUE DATE / TIME: 202402190541
ISSUE DATE / TIME: 202402221520
Unit Type and Rh: 7300
Unit Type and Rh: 7300
Unit Type and Rh: 7300
Unit Type and Rh: 7300

## 2022-10-25 LAB — TYPE AND SCREEN
ABO/RH(D): B POS
Antibody Screen: NEGATIVE
Unit division: 0
Unit division: 0
Unit division: 0
Unit division: 0

## 2022-10-25 MED ORDER — HYDROCODONE-ACETAMINOPHEN 5-325 MG PO TABS: 1.0000 | ORAL_TABLET | Freq: Four times a day (QID) | ORAL | 0 refills | Status: DC | PRN

## 2022-10-25 MED ORDER — HYDROCODONE-ACETAMINOPHEN 5-325 MG PO TABS
1.0000 | ORAL_TABLET | Freq: Four times a day (QID) | ORAL | 0 refills | Status: DC
  Filled 2022-10-25: qty 10, 3d supply, fill #0

## 2022-10-25 MED ORDER — FOLIC ACID 1 MG PO TABS
1.0000 mg | ORAL_TABLET | Freq: Every day | ORAL | 0 refills | Status: DC
  Filled 2022-10-25: qty 30, 30d supply, fill #0

## 2022-10-25 MED ORDER — THIAMINE HCL 100 MG PO TABS
100.0000 mg | ORAL_TABLET | Freq: Every day | ORAL | 0 refills | Status: DC
  Filled 2022-10-25: qty 30, 30d supply, fill #0

## 2022-10-25 MED ORDER — LOSARTAN POTASSIUM-HCTZ 100-25 MG PO TABS
1.0000 | ORAL_TABLET | Freq: Every day | ORAL | 0 refills | Status: DC
  Filled 2022-10-25: qty 30, 30d supply, fill #0

## 2022-10-25 MED ORDER — PANTOPRAZOLE SODIUM 40 MG PO TBEC
40.0000 mg | DELAYED_RELEASE_TABLET | Freq: Two times a day (BID) | ORAL | 0 refills | Status: DC
  Filled 2022-10-25: qty 60, 30d supply, fill #0

## 2022-10-25 NOTE — Discharge Summary (Signed)
Physician Discharge Summary  Michille Eagles [id number] DOB: [birth year] DOA: 10/21/2022  PCP: Charlott Rakes, MD  Admit date: 10/21/2022 Discharge date: 10/25/2022  Admitted From: (Home) Disposition:  (Home)  Recommendations for Outpatient Follow-up:  Follow up with PCP in 1-2 weeks Please obtain BMP/CBC in one week Patient to follow-up with [facility] regarding repeat labs on     Discharge Condition: (Stable) CODE STATUS: (FULL) Diet recommendation: Heart Healthy   Brief/Interim Summary:   67 year old F with PMH of alcohol abuse, alcoholic pancreatitis, GIB, GERD, IDA, osteoarthritis, NSAID use, asthma, HTN and HLD presenting with possible syncopal episode, epigastric abdominal pain and melanotic stool, and admitted for acute blood loss anemia in the setting of acute GI bleed, and significant electrolyte derangement with hyponatremia, hypomagnesemia, hypokalemia and hypophosphatemia.  Hgb 7.5 (baseline 9-10).  She also had markedly elevated lactic acidosis likely from alcohol.  It seems 2 units of blood ordered but she never been transfused.  GI consulted.   Patient underwent EGD that showed normal stomach (biopsied) and nonbleeding duodenal ulcer with no stigmata of bleeding.  GI recommended continuing PPI drip for 24 hours and changing to twice daily, advancing diet as tolerated, serial H&H, following pathology, avoiding NSAIDs and repeat endoscopy in 8 weeks  Acute on chronic blood loss anemia due to upper GI bleed:  - Baseline Hgb 9-10.  Reportedly uses naproxen daily for pain.  EGD shows nonbleeding duodenal ulcer.  Treated with Protonix drip initially, currently transitioned  to Protonix twice daily, She received 2 units PRBC yesterday as hemoglobin slowly trending down with most recent value of 7.1, good response to transfusion with hemoglobin of 8.7 on discharge, she was advised to avoid NSAIDs.    Hyponatremia/hypokalemia/hypomagnesemia/hypophosphatemia: Due to alcohol?    -Repleted  Lactic acidosis: Likely due to alcohol.  Resolved.  Low suspicion for infection.   Essential hypertension:  Resume Hyzaar     EtOH abuse/chronic alcoholic pancreatitis: Reportedly drinks beer daily.  Patient was not able to quantify. -She was kept on CIWA protocol -She was counseled -Evidence of withdrawals, will discharge on thiamine and folic acid   History of asthma: Stable. -Continue home inhalers   Body mass index is 24.21 kg/m.     Discharge Diagnoses:  Principal Problem:   Acute GI bleeding Active Problems:   Hyperlipidemia   Essential hypertension   GERD   Hypokalemia   Acute on chronic blood loss anemia   Hyponatremia   Hypomagnesemia   Hyperphosphatemia   Duodenal ulcer    Discharge Instructions  Discharge Instructions     Diet - low sodium heart healthy   Complete by: As directed    Discharge instructions   Complete by: As directed    Follow with Primary MD Charlott Rakes, MD in 7 days   Get CBC, CMP,  checked  by Primary MD next visit.    Activity: As tolerated with Full fall precautions use walker/cane & assistance as needed   Disposition Home    Diet: Heart Healthy   On your next visit with your primary care physician please Get Medicines reviewed and adjusted.   Please request your Prim.MD to go over all Hospital Tests and Procedure/Radiological results at the follow up, please get all Hospital records sent to your Prim MD by signing hospital release before you go home.   If you experience worsening of your admission symptoms, develop shortness of breath, life threatening emergency, suicidal or homicidal thoughts you must seek medical attention immediately by calling 911 or calling  your MD immediately  if symptoms less severe.  You Must read complete instructions/literature along with all the possible adverse reactions/side effects for all the Medicines you take and that have been prescribed to you. Take any new Medicines  after you have completely understood and accpet all the possible adverse reactions/side effects.   Do not drive, operating heavy machinery, perform activities at heights, swimming or participation in water activities or provide baby sitting services if your were admitted for syncope or siezures until you have seen by Primary MD or a Neurologist and advised to do so again.  Do not drive when taking Pain medications.    Do not take more than prescribed Pain, Sleep and Anxiety Medications  Special Instructions: If you have smoked or chewed Tobacco  in the last 2 yrs please stop smoking, stop any regular Alcohol  and or any Recreational drug use.  Wear Seat belts while driving.   Please note  You were cared for by a hospitalist during your hospital stay. If you have any questions about your discharge medications or the care you received while you were in the hospital after you are discharged, you can call the unit and asked to speak with the hospitalist on call if the hospitalist that took care of you is not available. Once you are discharged, your primary care physician will handle any further medical issues. Please note that NO REFILLS for any discharge medications will be authorized once you are discharged, as it is imperative that you return to your primary care physician (or establish a relationship with a primary care physician if you do not have one) for your aftercare needs so that they can reassess your need for medications and monitor your lab values.   Increase activity slowly   Complete by: As directed       Allergies as of 10/25/2022   No Known Allergies      Medication List     TAKE these medications    albuterol (2.5 MG/3ML) 0.083% nebulizer solution Commonly known as: PROVENTIL Take 3 mLs (2.5 mg total) by nebulization every 6 (six) hours as needed for wheezing or shortness of breath.   ProAir HFA 108 (90 Base) MCG/ACT inhaler Generic drug: albuterol INHALE 2 PUFFS  INTO THE LUNGS EVERY 6 (SIX) HOURS AS NEEDED FOR WHEEZING OR SHORTNESS OF BREATH.   atorvastatin 20 MG tablet Commonly known as: LIPITOR TAKE 1 TABLET BY MOUTH DAILY AT 6PM What changed: See the new instructions.   folic acid 1 MG tablet Commonly known as: FOLVITE Take 1 tablet (1 mg total) by mouth daily. Start taking on: October 26, 2022   HYDROcodone-acetaminophen 5-325 MG tablet Commonly known as: NORCO/VICODIN Take 1 tablet by mouth every 6 (six) hours as needed for severe pain.   losartan-hydrochlorothiazide 100-25 MG tablet Commonly known as: HYZAAR Take 1 tablet by mouth daily.   pantoprazole 40 MG tablet Commonly known as: PROTONIX Take 1 tablet (40 mg total) by mouth 2 (two) times daily.   thiamine 100 MG tablet Commonly known as: Vitamin B-1 Take 1 tablet (100 mg total) by mouth daily. Start taking on: October 26, 2022   Wixela Inhub 500-50 MCG/ACT Aepb Generic drug: fluticasone-salmeterol INHALE 1 PUFF INTO THE LUNGS IN THE MORNING AND AT BEDTIME. What changed:  when to take this additional instructions        Follow-up Information     Willia Craze, NP Follow up on 11/26/2022.   Specialty: Gastroenterology Why: at 10: 00  am. Contact information: [place] [address] [phone]         Care, [facility] Follow up.   Specialty: Home Health Services Why: The home health agency will contact you for the first home visit. Contact information: [place] [address] [phone]                No Known Allergies  Consultations: Gastroenterology   Procedures/Studies: CT ANGIO GI BLEED  Result Date: 10/21/2022 CLINICAL DATA:  Hemorrhage from the rectum. EXAM: CTA ABDOMEN AND PELVIS WITHOUT AND WITH CONTRAST TECHNIQUE: Multidetector CT imaging of the abdomen and pelvis was performed using the standard protocol during bolus administration of intravenous contrast. Multiplanar  reconstructed images and MIPs were obtained and reviewed to evaluate the vascular anatomy. RADIATION DOSE REDUCTION: This exam was performed according to the departmental dose-optimization program which includes automated exposure control, adjustment of the mA and/or kV according to patient size and/or use of iterative reconstruction technique. CONTRAST:  19m OMNIPAQUE IOHEXOL 350 MG/ML SOLN COMPARISON:  05/22/2021 FINDINGS: VASCULAR Suboptimal arterial opacification due to timing or interruption of contrast based on right heart opacification. Aorta: Atheromatous plaque.  No aneurysm or dissection Celiac: No branch occlusion, beading, or narrowing. SMA: No emergent finding or flow limiting stenosis Renals: Symmetric enhancement without evidence of stenosis IMA: Patent Inflow: Scattered atheromatous calcified plaques. Proximal Outflow: Negative Veins: Negative Review of the MIP images confirms the above findings. NON-VASCULAR Lower chest: Scarring or atelectasis in the left lower lung. Coronary atherosclerosis. Hepatobiliary: No focal liver abnormality. Small volume Litt tubular gas at the left lobe liver, likely pneumobilia in usually related to prior intervention. No gas seen within the portal venous system. No evidence of biliary obstruction or stone. Pancreas: Unremarkable. Spleen: Unremarkable. Adrenals/Urinary Tract: Negative adrenals. No hydronephrosis or stone. Unremarkable bladder. Stomach/Bowel: No evidence of active intraluminal bleeding. No bowel obstruction or inflammation. Lymphatic: No mass or adenopathy. Reproductive:Symmetric appearance of the adnexa.  Hysterectomy. Other: No ascites or pneumoperitoneum. Musculoskeletal: No acute abnormalities. Metallic foreign body at the right iliac fossa. Lumbar spine degeneration with L2 and L3 chronic superior endplate fractures. IMPRESSION: VASCULAR 1. No localized source of bleeding.  No sign of active GI bleeding. 2. Generalized atherosclerosis.  NON-VASCULAR Mild pneumobilia, correlate for sphincterotomy history. Otherwise stable exam since 2022. Electronically Signed   By: JJorje GuildM.D.   On: 10/21/2022 06:16   DG Chest Portable 1 View  Result Date: 10/21/2022 CLINICAL DATA:  aloc.  syncope EXAM: PORTABLE CHEST 1 VIEW COMPARISON:  Chest x-ray 04/16/2020, CT angio chest 07/13/2019 FINDINGS: The heart and mediastinal contours are within normal limits. Aortic calcification. No focal consolidation. Bibasilar interstitial markings. No pleural effusion. No pneumothorax. No acute osseous abnormality. IMPRESSION: 1. Bibasilar interstitial markings. Query underlying infection or inflammation. 2.  Aortic Atherosclerosis (ICD10-I70.0). Electronically Signed   By: MIven FinnM.D.   On: 10/21/2022 03:59      Subjective:  No significant events overnight, she denies any complaints Discharge Exam: Vitals:   10/25/22 0758 10/25/22 1136  BP: 121/77 110/65  Pulse: 88 90  Resp: 18 18  Temp: 98.3 F (36.8 C) 98.8 F (37.1 C)  SpO2: 99% 99%   Vitals:   10/25/22 0000 10/25/22 0400 10/25/22 0758 10/25/22 1136  BP: 115/68 112/73 121/77 110/65  Pulse: 91 86 88 90  Resp: '16 17 18 18  '$ Temp: 98.3 F (36.8 C) 98.6 F (37 C) 98.3 F (36.8 C) 98.8 F (37.1 C)  TempSrc: Oral Oral Oral Oral  SpO2: 95% 96% 99% 99%  Weight:      Height:        General: Pt is alert, awake, not in acute distress Cardiovascular: RRR, S1/S2 +, no rubs, no gallops Respiratory: CTA bilaterally, no wheezing, no rhonchi Abdominal: Soft, NT, ND, bowel sounds + Extremities: no edema, no cyanosis    The results of significant diagnostics from this hospitalization (including imaging, microbiology, ancillary and laboratory) are listed below for reference.     Microbiology: Recent Results (from the past 240 hour(s))  Resp panel by RT-PCR (RSV, Flu A&B, Covid) Anterior Nasal Swab     Status: None   Collection Time: 10/21/22  5:15 AM   Specimen: Anterior Nasal  Swab  Result Value Ref Range Status   SARS Coronavirus 2 by RT PCR NEGATIVE NEGATIVE Final   Influenza A by PCR NEGATIVE NEGATIVE Final   Influenza B by PCR NEGATIVE NEGATIVE Final    Comment: (NOTE) The Xpert Xpress SARS-CoV-2/FLU/RSV plus assay is intended as an aid in the diagnosis of influenza from Nasopharyngeal swab specimens and should not be used as a sole basis for treatment. Nasal washings and aspirates are unacceptable for Xpert Xpress SARS-CoV-2/FLU/RSV testing.  Fact Sheet for Patients: EntrepreneurPulse.com.au  Fact Sheet for Healthcare Providers: IncredibleEmployment.be  This test is not yet approved or cleared by the [place] FDA and has been authorized for detection and/or diagnosis of SARS-CoV-2 by FDA under an Emergency Use Authorization (EUA). This EUA will remain in effect (meaning this test can be used) for the duration of the COVID-19 declaration under Section 564(b)(1) of the Act, 21 U.S.C. section 360bbb-3(b)(1), unless the authorization is terminated or revoked.     Resp Syncytial Virus by PCR NEGATIVE NEGATIVE Final    Comment: (NOTE) Fact Sheet for Patients: EntrepreneurPulse.com.au  Fact Sheet for Healthcare Providers: IncredibleEmployment.be  This test is not yet approved or cleared by the [place] FDA and has been authorized for detection and/or diagnosis of SARS-CoV-2 by FDA under an Emergency Use Authorization (EUA). This EUA will remain in effect (meaning this test can be used) for the duration of the COVID-19 declaration under Section 564(b)(1) of the Act, 21 U.S.C. section 360bbb-3(b)(1), unless the authorization is terminated or revoked.  Performed at [facility], [place] [address], [place], [place] [address]      Labs: BNP (last 3 results) No results for input(s): "BNP" in the last 8760 hours. Basic Metabolic Panel: Recent Labs  Lab  10/21/22 0333 10/21/22 1025 10/22/22 0319 10/23/22 0458  NA 122*  --  131* 133*  K 2.4*  --  3.3* 4.3  CL 88*  --  100 105  CO2 22  --  23 18*  GLUCOSE 189*  --  90 89  BUN 34*  --  15 7*  CREATININE 0.93  --  0.65 0.71  CALCIUM 7.8*  --  8.0* 8.3*  MG  --  1.1* 2.1 1.6*  PHOS  --  11.1* 2.0* 2.3*   Liver Function Tests: Recent Labs  Lab 10/21/22 0333 10/22/22 0319 10/23/22 0458  AST 33 24 29  ALT '10 8 11  '$ ALKPHOS 50 46 47  BILITOT 0.5 0.7 0.5  PROT 5.1* 5.2* 5.1*  ALBUMIN 2.3* 2.3* 2.5*   Recent Labs  Lab 10/21/22 0333  LIPASE 37   No results for input(s): "AMMONIA" in the last 168 hours. CBC: Recent Labs  Lab 10/21/22 0333 10/21/22 1030 10/22/22 0319 10/22/22 1331  10/23/22 0458 10/24/22 0807 10/25/22 0822  WBC 5.0  --  6.0  --  4.6 6.3 6.4  NEUTROABS 3.1  --   --   --   --   --   --   HGB 7.5*   < > 7.6* 8.0* 7.4* 7.1* 8.7*  HCT 21.3*   < > 21.7* 22.6* 22.7* 21.9* 25.7*  MCV 98.2  --  90.0  --  97.0 97.8 96.3  PLT 153  --  194  --  204 251 275   < > = values in this interval not displayed.   Cardiac Enzymes: No results for input(s): "CKTOTAL", "CKMB", "CKMBINDEX", "TROPONINI" in the last 168 hours. BNP: Invalid input(s): "POCBNP" CBG: No results for input(s): "GLUCAP" in the last 168 hours. D-Dimer No results for input(s): "DDIMER" in the last 72 hours. Hgb A1c No results for input(s): "HGBA1C" in the last 72 hours. Lipid Profile No results for input(s): "CHOL", "HDL", "LDLCALC", "TRIG", "CHOLHDL", "LDLDIRECT" in the last 72 hours. Thyroid function studies No results for input(s): "TSH", "T4TOTAL", "T3FREE", "THYROIDAB" in the last 72 hours.  Invalid input(s): "FREET3" Anemia work up No results for input(s): "VITAMINB12", "FOLATE", "FERRITIN", "TIBC", "IRON", "RETICCTPCT" in the last 72 hours. Urinalysis    Component Value Date/Time   COLORURINE YELLOW 07/25/2021 1000   APPEARANCEUR TURBID (A) 07/25/2021 1000   LABSPEC <1.005 (L)  07/25/2021 1000   PHURINE 7.0 07/25/2021 1000   GLUCOSEU NEGATIVE 07/25/2021 1000   HGBUR MODERATE (A) 07/25/2021 1000   BILIRUBINUR SMALL (A) 07/25/2021 1000   KETONESUR NEGATIVE 07/25/2021 1000   PROTEINUR NEGATIVE 07/25/2021 1000   UROBILINOGEN 0.2 10/08/2012 1224   NITRITE NEGATIVE 07/25/2021 1000   LEUKOCYTESUR LARGE (A) 07/25/2021 1000   Sepsis Labs Recent Labs  Lab 10/22/22 0319 10/23/22 0458 10/24/22 0807 10/25/22 0822  WBC 6.0 4.6 6.3 6.4   Microbiology Recent Results (from the past 240 hour(s))  Resp panel by RT-PCR (RSV, Flu A&B, Covid) Anterior Nasal Swab     Status: None   Collection Time: 10/21/22  5:15 AM   Specimen: Anterior Nasal Swab  Result Value Ref Range Status   SARS Coronavirus 2 by RT PCR NEGATIVE NEGATIVE Final   Influenza A by PCR NEGATIVE NEGATIVE Final   Influenza B by PCR NEGATIVE NEGATIVE Final    Comment: (NOTE) The Xpert Xpress SARS-CoV-2/FLU/RSV plus assay is intended as an aid in the diagnosis of influenza from Nasopharyngeal swab specimens and should not be used as a sole basis for treatment. Nasal washings and aspirates are unacceptable for Xpert Xpress SARS-CoV-2/FLU/RSV testing.  Fact Sheet for Patients: EntrepreneurPulse.com.au  Fact Sheet for Healthcare Providers: IncredibleEmployment.be  This test is not yet approved or cleared by the [place] FDA and has been authorized for detection and/or diagnosis of SARS-CoV-2 by FDA under an Emergency Use Authorization (EUA). This EUA will remain in effect (meaning this test can be used) for the duration of the COVID-19 declaration under Section 564(b)(1) of the Act, 21 U.S.C. section 360bbb-3(b)(1), unless the authorization is terminated or revoked.     Resp Syncytial Virus by PCR NEGATIVE NEGATIVE Final    Comment: (NOTE) Fact Sheet for Patients: EntrepreneurPulse.com.au  Fact Sheet for Healthcare  Providers: IncredibleEmployment.be  This test is not yet approved or cleared by the [place] FDA and has been authorized for detection and/or diagnosis of SARS-CoV-2 by FDA under an Emergency Use Authorization (EUA). This EUA will remain in effect (meaning this test can be used) for the duration  of the COVID-19 declaration under Section 564(b)(1) of the Act, 21 U.S.C. section 360bbb-3(b)(1), unless the authorization is terminated or revoked.  Performed at [facility], [place] [address], [place], [place] [address]      Time coordinating discharge: Over 30 minutes  SIGNED:   Phillips Climes, MD  [facility] 10/25/2022, 11:57 AM Pager   If 7PM-7AM, please contact night-coverage [email or web address]

## 2022-10-25 NOTE — Care Management Important Message (Signed)
Important Message  Patient Details  Name: Angel Hebert MRN: [id number] Date of Birth: [birth year]   Medicare Important Message Given:  Yes     Layli Capshaw [address] 10/25/2022, 2:42 PM

## 2022-10-25 NOTE — Progress Notes (Signed)
Occupational Therapy Treatment Patient Details Name: Angel Hebert MRN: [id number] DOB: 26-Apr-1956 Today's Date: 10/25/2022   History of present illness 67 y/o F admitted to [facility] on 2/19 for melena and possible syncopal episode. Pt underwent EGD revelaing nonbleeding duodenal ulcer. PMHx: alcohol abuse, alcoholic pancreatitis, GIB, GERD, IDA, OA, NSAID use, asthma, HTN, and HLD.   OT comments  Pt progressing well towards OT goals. Focused on dressing tasks in prep for DC with pt overall managing independently. Addressed pt questions regarding return to normal activity levels with no further concerns noted.    Recommendations for follow up therapy are one component of a multi-disciplinary discharge planning process, led by the attending physician.  Recommendations may be updated based on patient status, additional functional criteria and insurance authorization.    Follow Up Recommendations  No OT follow up     Assistance Recommended at Discharge Set up Supervision/Assistance  Patient can return home with the following  A little help with bathing/dressing/bathroom;Assistance with cooking/housework   Equipment Recommendations  None recommended by OT    Recommendations for Other Services      Precautions / Restrictions Precautions Precautions: Fall Restrictions Weight Bearing Restrictions: No       Mobility Bed Mobility Overal bed mobility: Modified Independent                  Transfers Overall transfer level: Independent Equipment used: None Transfers: Sit to/from Stand                   Balance Overall balance assessment: Needs assistance Sitting-balance support: No upper extremity supported, Feet supported Sitting balance-Leahy Scale: Good     Standing balance support: No upper extremity supported, Single extremity supported, During functional activity Standing balance-Leahy Scale: Fair                             ADL either performed  or assessed with clinical judgement   ADL Overall ADL's : Needs assistance/impaired                 Upper Body Dressing : Independent;Sitting   Lower Body Dressing: Modified independent Lower Body Dressing Details (indicate cue type and reason): doffing/donning mesh underwear, pad, sweatpants and shoes. Did ask some questions regarding pad placement though feel pt would have problem solved this if OT not present               General ADL Comments: Dressing in prep for DC; no concerns reported. Pt did inquire about being able to clean room, chores, etc once home and educated pt that there are no restrictions on this and encouraged her to listen to her body when rest breaks or assistance needed.    Extremity/Trunk Assessment Upper Extremity Assessment Upper Extremity Assessment: Overall WFL for tasks assessed   Lower Extremity Assessment Lower Extremity Assessment: Defer to PT evaluation        Vision   Vision Assessment?: No apparent visual deficits   Perception     Praxis      Cognition Arousal/Alertness: Awake/alert Behavior During Therapy: WFL for tasks assessed/performed Overall Cognitive Status: Within Functional Limits for tasks assessed                                 General Comments: overall WFL; noted per chart, questionable baseline cognitive impairment        Exercises  Shoulder Instructions       General Comments Pt inquiring about if drinking one beer per day allowed. encouraged pt to minimize drinking with pt reporting desire to prioritize her health    Pertinent Vitals/ Pain       Pain Assessment Pain Assessment: No/denies pain  Home Living                                          Prior Functioning/Environment              Frequency  Min 2X/week        Progress Toward Goals  OT Goals(current goals can now be found in the care plan section)  Progress towards OT goals: Progressing  toward goals  Acute Rehab OT Goals Patient Stated Goal: home today OT Goal Formulation: With patient/family Time For Goal Achievement: 11/06/22 Potential to Achieve Goals: Good ADL Goals Pt Will Perform Tub/Shower Transfer: with supervision;Tub transfer Pt/caregiver will Perform Home Exercise Program: Increased strength;Both right and left upper extremity;With theraband;Independently;With written HEP provided Additional ADL Goal #1: Pt to demo improved standing activity tolerance > 7 min during ADLs/mobility with no more than one rest break  Plan Discharge plan remains appropriate    Co-evaluation                 AM-PAC OT "6 Clicks" Daily Activity     Outcome Measure   Help from another person eating meals?: None Help from another person taking care of personal grooming?: None Help from another person toileting, which includes using toliet, bedpan, or urinal?: A Little Help from another person bathing (including washing, rinsing, drying)?: A Little Help from another person to put on and taking off regular upper body clothing?: None Help from another person to put on and taking off regular lower body clothing?: None 6 Click Score: 22    End of Session    OT Visit Diagnosis: Unsteadiness on feet (R26.81);Muscle weakness (generalized) (M62.81)   Activity Tolerance Patient tolerated treatment well   Patient Left in bed;with call bell/phone within reach   Nurse Communication          Time: [id number] OT Time Calculation (min): 14 min  Charges: OT General Charges $OT Visit: 1 Visit OT Treatments $Self Care/Home Management : 8-22 mins  Malachy Chamber, OTR/L Acute Rehab Services Office: [phone]   Layla Maw 10/25/2022, 1:29 PM

## 2022-10-25 NOTE — TOC Transition Note (Signed)
Transition of Care [facility]) - CM/SW Discharge Note   Patient Details  Name: Angel Hebert MRN: [id number] Date of Birth: [birth year]  Transition of Care [facility]) CM/SW Contact:  Levonne Lapping, RN Phone Number: 10/25/2022, 2:53 PM   Clinical Narrative:   Patient to  dc to home with Family.  Home Health PT will be with [facility]. No additional needs  .     Barriers to Discharge: Continued Medical Work up   Patient Goals and CMS Choice CMS Medicare.gov Compare Post Acute Care list provided to:: Patient Choice offered to / list presented to : Patient  Discharge Placement                         Discharge Plan and Services Additional resources added to the After Visit Summary for     Discharge Planning Services: CM Consult Post Acute Care Choice: Home Health                    HH Arranged: PT [place]: [place] Date [facility] Contacted: 10/24/22   Representative spoke with at [place]: [place] Determinants of Health ([place]) Interventions SDOH Screenings   Depression (PHQ2-9): Low Risk  (08/22/2021)  Tobacco Use: Low Risk  (10/23/2022)     Readmission Risk Interventions    04/18/2020    2:10 PM  Readmission Risk Prevention Plan  Post Dischage Appt Complete  Medication Screening Complete  Transportation Screening Complete

## 2022-10-28 ENCOUNTER — Telehealth: Payer: Self-pay

## 2022-10-28 NOTE — Transitions of Care (Post Inpatient/ED Visit) (Signed)
   10/28/2022  Name: Angel Hebert MRN: [id number] DOB: 03/03/1956  Today's TOC FU Call Status: Today's TOC FU Call Status:: Unsuccessul Call (1st Attempt) Unsuccessful Call (1st Attempt) Date: 10/28/22  Attempted to reach the patient regarding the most recent Inpatient/ED visit.  Follow Up Plan: Additional outreach attempts will be made to reach the patient to complete the Transitions of Care (Post Inpatient/ED visit) call.     Enzo Montgomery, RN,BSN,CCM [facility] Care Management Care Management Community Coordinator Direct Phone: [phone] Toll Free: [phone] Fax: [phone]

## 2022-10-29 ENCOUNTER — Telehealth: Payer: Self-pay

## 2022-10-29 NOTE — Transitions of Care (Post Inpatient/ED Visit) (Signed)
   10/29/2022  Name: Angel Hebert MRN: [id number] DOB: 11/14/1955  Today's TOC FU Call Status: Today's TOC FU Call Status:: Unsuccessful Call (2nd Attempt) Unsuccessful Call (2nd Attempt) Date: 10/29/22  Attempted to reach the patient regarding the most recent Inpatient/ED visit.  Follow Up Plan: Additional outreach attempts will be made to reach the patient to complete the Transitions of Care (Post Inpatient/ED visit) call.     Enzo Montgomery, RN,BSN,CCM [facility] Care Management Care Management Community Coordinator Direct Phone: [phone] Toll Free: [phone] Fax: [phone]

## 2022-10-29 NOTE — Transitions of Care (Post Inpatient/ED Visit) (Signed)
   10/29/2022  Name: Angel Hebert MRN: [id number] DOB: 1955-12-19  Today's TOC FU Call Status: Today's TOC FU Call Status:: Unsuccessful Call (3rd Attempt) Unsuccessful Call (3rd Attempt) Date: 10/29/22  Attempted to reach the patient regarding the most recent Inpatient/ED visit.  Follow Up Plan: No further outreach attempts will be made at this time. We have been unable to contact the patient.   Enzo Montgomery, RN,BSN,CCM [facility] Care Management Care Management Community Coordinator Direct Phone: [phone] Toll Free: [phone] Fax: [phone]

## 2022-10-31 ENCOUNTER — Encounter: Payer: Self-pay | Admitting: Gastroenterology

## 2022-11-07 ENCOUNTER — Other Ambulatory Visit: Payer: Self-pay

## 2022-11-07 ENCOUNTER — Telehealth: Payer: Self-pay | Admitting: Family Medicine

## 2022-11-07 DIAGNOSIS — Z1231 Encounter for screening mammogram for malignant neoplasm of breast: Secondary | ICD-10-CM

## 2022-11-07 NOTE — Telephone Encounter (Signed)
Referral Request - Has patient seen PCP for this complaint? Received letter *If NO, is insurance requiring patient see PCP for this issue before PCP can refer them? Referral for which specialty: mamogram Preferred provider/office: ? Reason for referral: annal mammo

## 2022-11-07 NOTE — Telephone Encounter (Signed)
Order has been placed.

## 2022-11-26 ENCOUNTER — Ambulatory Visit: Payer: 59 | Admitting: Nurse Practitioner

## 2022-11-26 NOTE — Progress Notes (Deleted)
Assessment   Recent upper GI bleed / anemia   Etoh abuse  History of adenomatous colon polyps.   Three tubular adenomas in 2017. Overdue for surveillance colonoscopy.    Plan   8 week egd Schedule for follow up EGD to document ulcer healing. The risks and benefits of EGD with possible biopsies were discussed with the patient who agrees to proceed.  Schedule for a colonoscopy. The risks and benefits of colonoscopy with possible polypectomy / biopsies were discussed and the patient agrees to proceed.         History of Present Illness   Chief complaint:      Angel Hebert is a 67 y.o. female known to Dr. Silverio Decamp with a past medical history of adenomatous colon polyps, diverticulosis, hypertension, hyperlipidemia, asthma, Etoh abuse, GERD, bilateral knee osteoarthritis  CHF . See PMH / [place] for additional details.   Angel Hebert was hospitalized mid February with a GI bleed /anemia.  Hemoglobin was 7.5 (no recent labs to know what her baseline was).  Hemoglobin improved 8.7 with blood transfusion.  Inpatient EGD was remarkable for nonbleeding duodenal ulcer.  Biopsies negative for H. Pylori.       Interval History:     Imaging:    Labs:     Latest Ref Rng & Units 10/23/2022    4:58 AM 10/22/2022    3:19 AM 10/21/2022    3:33 AM  Hepatic Function  Total Protein 6.5 - 8.1 g/dL 5.1  5.2  5.1   Albumin 3.5 - 5.0 g/dL 2.5  2.3  2.3   AST 15 - 41 U/L 29  24  33   ALT 0 - 44 U/L 11  8  10    Alk Phosphatase 38 - 126 U/L 47  46  50   Total Bilirubin 0.3 - 1.2 mg/dL 0.5  0.7  0.5        Latest Ref Rng & Units 10/25/2022    8:22 AM 10/24/2022    8:07 AM 10/23/2022    4:58 AM  CBC  WBC 4.0 - 10.5 K/uL 6.4  6.3  4.6   Hemoglobin 12.0 - 15.0 g/dL 8.7  7.1  7.4   Hematocrit 36.0 - 46.0 % 25.7  21.9  22.7   Platelets 150 - 400 K/uL 275  251  204       Previous GI Evaluation   10/22/22 EGD Normal esophagus. - Normal stomach. Biopsied. - Non-bleeding duodenal  ulcer with no stigmata of bleeding. - The examination was otherwise normal. Impression: -   Past Medical History:  Diagnosis Date   Arthritis    Asthma    last year in December   Dyspnea    GERD (gastroesophageal reflux disease)    High cholesterol    Hypertension    Pneumonia 01/2017    Past Surgical History:  Procedure Laterality Date   ABDOMINAL HYSTERECTOMY     BIOPSY  10/22/2022   Procedure: BIOPSY;  Surgeon: Thornton Park, MD;  Location: [place];  Service: Gastroenterology;;   ESOPHAGOGASTRODUODENOSCOPY (EGD) WITH PROPOFOL N/A 10/22/2022   Procedure: ESOPHAGOGASTRODUODENOSCOPY (EGD) WITH PROPOFOL;  Surgeon: Thornton Park, MD;  Location: [place];  Service: Gastroenterology;  Laterality: N/A;   FEMUR IM NAIL Left 04/17/2020   Procedure: INTRAMEDULLARY (IM) RETROGRADE FEMORAL NAILING;  Surgeon: Meredith Pel, MD;  Location: [place];  Service: Orthopedics;  Laterality: Left;   nasal polyps     removed just this past tuesday at [place] facility over by  [place]   TOTAL KNEE ARTHROPLASTY Left 10/01/2016   Procedure: LEFT TOTAL KNEE ARTHROPLASTY;  Surgeon: Meredith Pel, MD;  Location: [place];  Service: Orthopedics;  Laterality: Left;    Current Medications, Allergies, Family History and Social History were reviewed in [employer] record.     Current Outpatient Medications  Medication Sig Dispense Refill   albuterol (PROVENTIL) (2.5 MG/3ML) 0.083% nebulizer solution Take 3 mLs (2.5 mg total) by nebulization every 6 (six) hours as needed for wheezing or shortness of breath. (Patient not taking: Reported on 10/21/2022) 75 mL 3   atorvastatin (LIPITOR) 20 MG tablet TAKE 1 TABLET BY MOUTH DAILY AT 6PM (Patient taking differently: Take 20 mg by mouth daily.) 90 tablet 0   fluticasone-salmeterol (WIXELA INHUB) 500-50 MCG/ACT AEPB INHALE 1 PUFF INTO THE LUNGS IN THE MORNING AND AT BEDTIME. (Patient taking differently: Inhale 1 puff into the  lungs in the morning and at bedtime.) 60 each 1   folic acid (FOLVITE) 1 MG tablet Take 1 tablet (1 mg total) by mouth daily. 30 tablet 0   HYDROcodone-acetaminophen (NORCO/VICODIN) 5-325 MG tablet Take 1 tablet by mouth every 6 (six) hours as needed for severe pain. 10 tablet 0   HYDROcodone-acetaminophen (NORCO/VICODIN) 5-325 MG tablet Take 1 tablets by mouth every 6 (six) hours as needed for severe pain. 10 tablet 0   losartan-hydrochlorothiazide (HYZAAR) 100-25 MG tablet Take 1 tablet by mouth daily. 30 tablet 0   pantoprazole (PROTONIX) 40 MG tablet Take 1 tablet (40 mg total) by mouth 2 (two) times daily. 60 tablet 0   PROAIR HFA 108 (90 Base) MCG/ACT inhaler INHALE 2 PUFFS INTO THE LUNGS EVERY 6 (SIX) HOURS AS NEEDED FOR WHEEZING OR SHORTNESS OF BREATH. 8.5 g 5   thiamine (VITAMIN B1) 100 MG tablet Take 1 tablet (100 mg total) by mouth daily. 30 tablet 0   No current facility-administered medications for this visit.    Review of Systems: No chest pain. No shortness of breath. No urinary complaints.    Physical Exam  Wt Readings from Last 3 Encounters:  10/21/22 150 lb (68 kg)  08/22/21 117 lb (53.1 kg)  07/25/21 115 lb (52.2 kg)    There were no vitals taken for this visit. Constitutional:  Pleasant, generally well appearing ***female in no acute distress. Psychiatric: Normal mood and affect. Behavior is normal. EENT: Pupils normal.  Conjunctivae are normal. No scleral icterus. Neck supple.  Cardiovascular: Normal rate, regular rhythm.  Pulmonary/chest: Effort normal and breath sounds normal. No wheezing, rales or rhonchi. Abdominal: Soft, nondistended, nontender. Bowel sounds active throughout. There are no masses palpable. No hepatomegaly. Neurological: Alert and oriented to person place and time.  Extremities: *** edema Skin: Skin is warm and dry. No rashes noted.  Tye Savoy, NP  11/26/2022, 8:22 AM  Cc:  Charlott Rakes, MD

## 2022-11-29 ENCOUNTER — Other Ambulatory Visit (HOSPITAL_COMMUNITY): Payer: Self-pay

## 2022-12-09 ENCOUNTER — Other Ambulatory Visit (HOSPITAL_COMMUNITY): Payer: Self-pay

## 2022-12-09 ENCOUNTER — Other Ambulatory Visit: Payer: Self-pay

## 2022-12-10 ENCOUNTER — Other Ambulatory Visit: Payer: Self-pay | Admitting: Family Medicine

## 2022-12-10 ENCOUNTER — Other Ambulatory Visit: Payer: Self-pay

## 2022-12-10 DIAGNOSIS — I10 Essential (primary) hypertension: Secondary | ICD-10-CM

## 2022-12-12 ENCOUNTER — Other Ambulatory Visit: Payer: Self-pay

## 2022-12-12 MED ORDER — PANTOPRAZOLE SODIUM 40 MG PO TBEC
40.0000 mg | DELAYED_RELEASE_TABLET | Freq: Two times a day (BID) | ORAL | 0 refills | Status: DC
  Filled 2022-12-12: qty 60, 30d supply, fill #0

## 2022-12-12 MED ORDER — LOSARTAN POTASSIUM-HCTZ 100-25 MG PO TABS
1.0000 | ORAL_TABLET | Freq: Every day | ORAL | 0 refills | Status: DC
  Filled 2022-12-12: qty 30, 30d supply, fill #0

## 2022-12-12 MED ORDER — FOLIC ACID 1 MG PO TABS
1.0000 mg | ORAL_TABLET | Freq: Every day | ORAL | 0 refills | Status: DC
  Filled 2022-12-12: qty 30, 30d supply, fill #0

## 2022-12-12 MED ORDER — THIAMINE HCL 100 MG PO TABS
100.0000 mg | ORAL_TABLET | Freq: Every day | ORAL | 0 refills | Status: AC
  Filled 2022-12-12: qty 30, 30d supply, fill #0

## 2022-12-19 ENCOUNTER — Other Ambulatory Visit: Payer: Self-pay

## 2022-12-25 ENCOUNTER — Ambulatory Visit (INDEPENDENT_AMBULATORY_CARE_PROVIDER_SITE_OTHER): Payer: 59 | Admitting: Orthopedic Surgery

## 2022-12-25 ENCOUNTER — Other Ambulatory Visit: Payer: Self-pay | Admitting: Family Medicine

## 2022-12-25 ENCOUNTER — Other Ambulatory Visit (INDEPENDENT_AMBULATORY_CARE_PROVIDER_SITE_OTHER): Payer: 59

## 2022-12-25 DIAGNOSIS — M1711 Unilateral primary osteoarthritis, right knee: Secondary | ICD-10-CM

## 2022-12-25 DIAGNOSIS — M25561 Pain in right knee: Secondary | ICD-10-CM

## 2022-12-25 DIAGNOSIS — M25562 Pain in left knee: Secondary | ICD-10-CM

## 2022-12-27 ENCOUNTER — Encounter: Payer: Self-pay | Admitting: Orthopedic Surgery

## 2022-12-27 MED ORDER — BUPIVACAINE HCL 0.25 % IJ SOLN
4.0000 mL | INTRAMUSCULAR | Status: AC | PRN
  Administered 2022-12-25: 4 mL via INTRA_ARTICULAR

## 2022-12-27 MED ORDER — METHYLPREDNISOLONE ACETATE 40 MG/ML IJ SUSP
40.0000 mg | INTRAMUSCULAR | Status: AC | PRN
  Administered 2022-12-25: 40 mg via INTRA_ARTICULAR

## 2022-12-27 MED ORDER — LIDOCAINE HCL 1 % IJ SOLN
5.0000 mL | INTRAMUSCULAR | Status: AC | PRN
  Administered 2022-12-25: 5 mL

## 2022-12-27 NOTE — Progress Notes (Signed)
Office Visit Note   Patient: Angel Hebert           Date of Birth: December 25, 1955           MRN: [id number] Visit Date: 12/25/2022 Requested by: [name], MD [address] [place],  [place] [address] PCP: [name], MD  Subjective: Chief Complaint  Patient presents with   Other     Bilateral knee pain    HPI: Angel Hebert is a 67 y.o. female who presents to the office reporting bilateral knee pain right worse than left.  She would like to have a Bledsoe brace for her left leg.  The leg hyperextends when she ambulates.  She had a total knee replacement but that was complicated by a postop femur fracture after a fall.  She reports right knee pain with every step but is not quite bad enough yet for knee replacement.  She would like to have that knee aspirated and injected today.  She is here with her husband/boyfriend.                ROS: All systems reviewed are negative as they relate to the chief complaint within the history of present illness.  Patient denies fevers or chills.  Assessment & Plan: Visit Diagnoses:  1. 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  Patient does have instability with hyperextension as well as varus and valgus instability and would benefit from a Bledsoe brace.  This is definitely a better option for her than any type of major revision surgery with constrained implant placed.  Prescription for Bledsoe brace for left knee instability is provided.  Right knee is aspirated and injected.  Follow-up with [place] as needed.  Follow-Up Instructions: No follow-ups on file.   Orders:  Orders Placed This Encounter  Procedures   XR KNEE 3 VIEW RIGHT   XR KNEE 3 VIEW LEFT   No orders of the defined types were placed in this encounter.     Procedures: Large Joint Inj: R knee on 12/25/2022 5:49 PM Indications: diagnostic  evaluation, joint swelling and pain Details: 18 G 1.5 in needle, superolateral approach  Arthrogram: No  Medications: 5 mL lidocaine 1 %; 40 mg methylPREDNISolone acetate 40 MG/ML; 4 mL bupivacaine 0.25 % Outcome: tolerated well, no immediate complications Procedure, treatment alternatives, risks and benefits explained, specific risks discussed. Consent was given by the patient. Immediately prior to procedure a time out was called to verify the correct patient, procedure, equipment, support staff and site/side marked as required. Patient was prepped and draped in the usual sterile fashion.       Clinical Data: No additional findings.  Objective: Vital Signs: There were no vitals taken for this visit.  Physical Exam:  Constitutional: Patient appears well-developed HEENT:  Head: Normocephalic Eyes:EOM are normal Neck: Normal range of motion Cardiovascular: Normal rate Pulmonary/chest: Effort normal Neurologic: Patient is alert Skin: Skin is warm Psychiatric: Patient has normal mood and affect  Ortho Exam: Ortho exam on that right-hand side demonstrates medial greater than joint line tenderness.  Mild effusion is present.  Collateral crucial ligaments are stable.  Patellofemoral crepitus is present.  Pedal pulses palpable bilaterally.  Left knee demonstrates hyperextension to about 10 degrees with no hyperextension on the right-hand side.  She does have a little bit of collateral ligament laxity as well in that left knee.  No effusion in the left knee.  Do not feel any crepitus with range of motion in the left knee.  She is able to flex to about 90 degrees.  Specialty Comments:  No specialty comments available.  Imaging: No results found.   PMFS History: Patient Active Problem List   Diagnosis Date Noted   Duodenal ulcer 10/22/2022   Acute GI bleeding 10/21/2022   GI bleed 10/21/2022   Protein-calorie malnutrition, severe (HCC) 10/21/2022   Acute on chronic blood loss  anemia 10/21/2022   Hyponatremia 10/21/2022   Hypomagnesemia 10/21/2022   Hyperphosphatemia 10/21/2022   AKI (acute kidney injury) (HCC)    Alcohol induced acute pancreatitis 07/26/2021   Gastritis 07/25/2021   Periprosthetic fracture around internal prosthetic left knee joint 04/17/2020   Closed fracture of left distal femur (HCC) 04/16/2020   Symptomatic anemia 07/12/2019   Hypokalemia 07/12/2019   Lobar pneumonia (HCC) 07/12/2019   Insomnia 03/03/2017   History of total knee arthroplasty, left 11/27/2016   Arthritis of knee 10/01/2016   Chronic pansinusitis 09/05/2016   Physical deconditioning    Anemia, iron deficiency    Pressure ulcer 10/14/2015   CAP (community acquired pneumonia)    Sepsis due to Streptococcus pneumoniae (HCC)    Hyperlipidemia 03/02/2007   Essential hypertension 03/02/2007   Allergic rhinitis 03/02/2007   Asthma 03/02/2007   GERD 03/02/2007   Past Medical History:  Diagnosis Date   Arthritis    Asthma    last year in December   Dyspnea    GERD (gastroesophageal reflux disease)    High cholesterol    Hypertension    Pneumonia 01/2017    Family History  Problem Relation Age of Onset   Diabetes Sister    Healthy Mother    Healthy Father    Colon cancer Neg Hx    Breast cancer Neg Hx     Past Surgical History:  Procedure Laterality Date   ABDOMINAL HYSTERECTOMY     BIOPSY  10/22/2022   Procedure: BIOPSY;  Surgeon: Tressia Danas, MD;  Location: [facility] ENDOSCOPY;  Service: Gastroenterology;;   ESOPHAGOGASTRODUODENOSCOPY (EGD) WITH PROPOFOL N/A 10/22/2022   Procedure: ESOPHAGOGASTRODUODENOSCOPY (EGD) WITH PROPOFOL;  Surgeon: Tressia Danas, MD;  Location: [facility] ENDOSCOPY;  Service: Gastroenterology;  Laterality: N/A;   FEMUR IM NAIL Left 04/17/2020   Procedure: INTRAMEDULLARY (IM) RETROGRADE FEMORAL NAILING;  Surgeon: Cammy Copa, MD;  Location: [facility] OR;  Service: Orthopedics;  Laterality: Left;   nasal polyps     removed just this past  tuesday [facility]   TOTAL KNEE ARTHROPLASTY Left 10/01/2016   Procedure: LEFT TOTAL KNEE ARTHROPLASTY;  Surgeon: Cammy Copa, MD;  Location: [facility] OR;  Service: Orthopedics;  Laterality: Left;   Social History   Occupational History   Not on file  Tobacco Use   Smoking status: Never   Smokeless tobacco: Never  Vaping Use   Vaping Use: Never used  Substance and Sexual Activity   Alcohol use: Yes    Alcohol/week: 1.0 standard drink of alcohol    Types: 1 Shots of liquor per week    Comment: socially   Drug use: No   Sexual activity: Not on file

## 2023-01-03 ENCOUNTER — Other Ambulatory Visit: Payer: Self-pay

## 2023-01-11 ENCOUNTER — Emergency Department (HOSPITAL_COMMUNITY): Payer: 59

## 2023-01-11 ENCOUNTER — Encounter (HOSPITAL_COMMUNITY): Payer: Self-pay

## 2023-01-11 ENCOUNTER — Inpatient Hospital Stay (HOSPITAL_COMMUNITY)
Admission: EM | Admit: 2023-01-11 | Discharge: 2023-01-13 | DRG: 377 | Disposition: A | Discharge status: Home / Self Care | Payer: 59 | Attending: Internal Medicine | Admitting: Internal Medicine

## 2023-01-11 ENCOUNTER — Other Ambulatory Visit: Payer: Self-pay

## 2023-01-11 DIAGNOSIS — M170 Bilateral primary osteoarthritis of knee: Secondary | ICD-10-CM | POA: Diagnosis not present

## 2023-01-11 DIAGNOSIS — R109 Unspecified abdominal pain: Secondary | ICD-10-CM

## 2023-01-11 DIAGNOSIS — E876 Hypokalemia: Secondary | ICD-10-CM | POA: Diagnosis not present

## 2023-01-11 DIAGNOSIS — K2970 Gastritis, unspecified, without bleeding: Secondary | ICD-10-CM

## 2023-01-11 DIAGNOSIS — D509 Iron deficiency anemia, unspecified: Secondary | ICD-10-CM | POA: Diagnosis not present

## 2023-01-11 DIAGNOSIS — K2101 Gastro-esophageal reflux disease with esophagitis, with bleeding: Secondary | ICD-10-CM | POA: Diagnosis not present

## 2023-01-11 DIAGNOSIS — I509 Heart failure, unspecified: Secondary | ICD-10-CM

## 2023-01-11 DIAGNOSIS — E875 Hyperkalemia: Secondary | ICD-10-CM

## 2023-01-11 DIAGNOSIS — K648 Other hemorrhoids: Secondary | ICD-10-CM | POA: Diagnosis present

## 2023-01-11 DIAGNOSIS — K922 Gastrointestinal hemorrhage, unspecified: Secondary | ICD-10-CM | POA: Diagnosis not present

## 2023-01-11 DIAGNOSIS — K219 Gastro-esophageal reflux disease without esophagitis: Secondary | ICD-10-CM | POA: Diagnosis not present

## 2023-01-11 DIAGNOSIS — E8889 Other specified metabolic disorders: Secondary | ICD-10-CM

## 2023-01-11 DIAGNOSIS — Z79899 Other long term (current) drug therapy: Secondary | ICD-10-CM

## 2023-01-11 DIAGNOSIS — F1090 Alcohol use, unspecified, uncomplicated: Secondary | ICD-10-CM | POA: Diagnosis not present

## 2023-01-11 DIAGNOSIS — I110 Hypertensive heart disease with heart failure: Secondary | ICD-10-CM | POA: Diagnosis not present

## 2023-01-11 DIAGNOSIS — K269 Duodenal ulcer, unspecified as acute or chronic, without hemorrhage or perforation: Secondary | ICD-10-CM | POA: Diagnosis not present

## 2023-01-11 DIAGNOSIS — I10 Essential (primary) hypertension: Secondary | ICD-10-CM | POA: Diagnosis not present

## 2023-01-11 DIAGNOSIS — E785 Hyperlipidemia, unspecified: Secondary | ICD-10-CM

## 2023-01-11 DIAGNOSIS — Z96652 Presence of left artificial knee joint: Secondary | ICD-10-CM | POA: Diagnosis not present

## 2023-01-11 DIAGNOSIS — K449 Diaphragmatic hernia without obstruction or gangrene: Secondary | ICD-10-CM | POA: Diagnosis present

## 2023-01-11 DIAGNOSIS — F1010 Alcohol abuse, uncomplicated: Secondary | ICD-10-CM | POA: Diagnosis present

## 2023-01-11 DIAGNOSIS — T39395A Adverse effect of other nonsteroidal anti-inflammatory drugs [NSAID], initial encounter: Secondary | ICD-10-CM | POA: Diagnosis present

## 2023-01-11 DIAGNOSIS — Z833 Family history of diabetes mellitus: Secondary | ICD-10-CM | POA: Diagnosis not present

## 2023-01-11 DIAGNOSIS — K264 Chronic or unspecified duodenal ulcer with hemorrhage: Secondary | ICD-10-CM | POA: Diagnosis not present

## 2023-01-11 DIAGNOSIS — E871 Hypo-osmolality and hyponatremia: Secondary | ICD-10-CM

## 2023-01-11 DIAGNOSIS — N179 Acute kidney failure, unspecified: Secondary | ICD-10-CM

## 2023-01-11 DIAGNOSIS — J45909 Unspecified asthma, uncomplicated: Secondary | ICD-10-CM | POA: Diagnosis present

## 2023-01-11 DIAGNOSIS — Z86010 Personal history of colonic polyps: Secondary | ICD-10-CM | POA: Diagnosis not present

## 2023-01-11 DIAGNOSIS — D62 Acute posthemorrhagic anemia: Secondary | ICD-10-CM | POA: Diagnosis not present

## 2023-01-11 DIAGNOSIS — E7800 Pure hypercholesterolemia, unspecified: Secondary | ICD-10-CM | POA: Diagnosis present

## 2023-01-11 DIAGNOSIS — K279 Peptic ulcer, site unspecified, unspecified as acute or chronic, without hemorrhage or perforation: Secondary | ICD-10-CM

## 2023-01-11 DIAGNOSIS — I5032 Chronic diastolic (congestive) heart failure: Secondary | ICD-10-CM | POA: Diagnosis present

## 2023-01-11 DIAGNOSIS — K921 Melena: Secondary | ICD-10-CM

## 2023-01-11 DIAGNOSIS — J4520 Mild intermittent asthma, uncomplicated: Secondary | ICD-10-CM | POA: Diagnosis not present

## 2023-01-11 DIAGNOSIS — E119 Type 2 diabetes mellitus without complications: Secondary | ICD-10-CM | POA: Diagnosis not present

## 2023-01-11 DIAGNOSIS — E8729 Other acidosis: Secondary | ICD-10-CM | POA: Diagnosis not present

## 2023-01-11 DIAGNOSIS — K2951 Unspecified chronic gastritis with bleeding: Secondary | ICD-10-CM | POA: Diagnosis not present

## 2023-01-11 DIAGNOSIS — K5730 Diverticulosis of large intestine without perforation or abscess without bleeding: Secondary | ICD-10-CM | POA: Diagnosis not present

## 2023-01-11 DIAGNOSIS — K860 Alcohol-induced chronic pancreatitis: Secondary | ICD-10-CM | POA: Diagnosis present

## 2023-01-11 DIAGNOSIS — D508 Other iron deficiency anemias: Secondary | ICD-10-CM | POA: Diagnosis not present

## 2023-01-11 DIAGNOSIS — K2971 Gastritis, unspecified, with bleeding: Secondary | ICD-10-CM | POA: Diagnosis present

## 2023-01-11 DIAGNOSIS — D649 Anemia, unspecified: Secondary | ICD-10-CM | POA: Diagnosis not present

## 2023-01-11 DIAGNOSIS — K2950 Unspecified chronic gastritis without bleeding: Secondary | ICD-10-CM | POA: Diagnosis not present

## 2023-01-11 DIAGNOSIS — K209 Esophagitis, unspecified without bleeding: Secondary | ICD-10-CM | POA: Diagnosis not present

## 2023-01-11 LAB — CBC
HCT: 36.4 % (ref 36.0–46.0)
HCT: 25.6 % — ABNORMAL LOW (ref 36.0–46.0)
Hemoglobin: 12.1 g/dL (ref 12.0–15.0)
Hemoglobin: 8.8 g/dL — ABNORMAL LOW (ref 12.0–15.0)
MCH: 33.1 pg (ref 26.0–34.0)
MCH: 33.5 pg (ref 26.0–34.0)
MCHC: 33.2 g/dL (ref 30.0–36.0)
MCHC: 34.4 g/dL (ref 30.0–36.0)
MCV: 99.5 fL (ref 80.0–100.0)
MCV: 97.3 fL (ref 80.0–100.0)
Platelets: 387 10*3/uL (ref 150–400)
Platelets: 304 10*3/uL (ref 150–400)
RBC: 3.66 MIL/uL — ABNORMAL LOW (ref 3.87–5.11)
RBC: 2.63 MIL/uL — ABNORMAL LOW (ref 3.87–5.11)
RDW: 16.9 % — ABNORMAL HIGH (ref 11.5–15.5)
RDW: 17 % — ABNORMAL HIGH (ref 11.5–15.5)
WBC: 8.7 10*3/uL (ref 4.0–10.5)
WBC: 8.2 10*3/uL (ref 4.0–10.5)
nRBC: 0 % (ref 0.0–0.2)
nRBC: 0 % (ref 0.0–0.2)

## 2023-01-11 LAB — I-STAT CHEM 8, ED
BUN: 49 mg/dL — ABNORMAL HIGH (ref 8–23)
Calcium, Ion: 1.13 mmol/L — ABNORMAL LOW (ref 1.15–1.40)
Chloride: 94 mmol/L — ABNORMAL LOW (ref 98–111)
Creatinine, Ser: 1.5 mg/dL — ABNORMAL HIGH (ref 0.44–1.00)
Glucose, Bld: 142 mg/dL — ABNORMAL HIGH (ref 70–99)
HCT: 34 % — ABNORMAL LOW (ref 36.0–46.0)
Hemoglobin: 11.6 g/dL — ABNORMAL LOW (ref 12.0–15.0)
Potassium: 3.2 mmol/L — ABNORMAL LOW (ref 3.5–5.1)
Sodium: 129 mmol/L — ABNORMAL LOW (ref 135–145)
TCO2: 17 mmol/L — ABNORMAL LOW (ref 22–32)

## 2023-01-11 LAB — PROTIME-INR
INR: 1 (ref 0.8–1.2)
Prothrombin Time: 13.8 seconds (ref 11.4–15.2)

## 2023-01-11 LAB — POC OCCULT BLOOD, ED: Fecal Occult Bld: POSITIVE — AB

## 2023-01-11 LAB — URINALYSIS, ROUTINE W REFLEX MICROSCOPIC
Bilirubin Urine: NEGATIVE
Glucose, UA: NEGATIVE mg/dL
Ketones, ur: 80 mg/dL — AB
Leukocytes,Ua: NEGATIVE
Nitrite: NEGATIVE
Protein, ur: NEGATIVE mg/dL
Specific Gravity, Urine: 1.013 (ref 1.005–1.030)
pH: 5 (ref 5.0–8.0)

## 2023-01-11 LAB — COMPREHENSIVE METABOLIC PANEL
ALT: 13 U/L (ref 0–44)
AST: 29 U/L (ref 15–41)
Albumin: 3.5 g/dL (ref 3.5–5.0)
Alkaline Phosphatase: 87 U/L (ref 38–126)
Anion gap: 33 — ABNORMAL HIGH (ref 5–15)
BUN: 46 mg/dL — ABNORMAL HIGH (ref 8–23)
CO2: 15 mmol/L — ABNORMAL LOW (ref 22–32)
Calcium: 9 mg/dL (ref 8.9–10.3)
Chloride: 83 mmol/L — ABNORMAL LOW (ref 98–111)
Creatinine, Ser: 1.65 mg/dL — ABNORMAL HIGH (ref 0.44–1.00)
GFR, Estimated: 34 mL/min — ABNORMAL LOW (ref >60)
Glucose, Bld: 115 mg/dL — ABNORMAL HIGH (ref 70–99)
Potassium: 3 mmol/L — ABNORMAL LOW (ref 3.5–5.1)
Sodium: 131 mmol/L — ABNORMAL LOW (ref 135–145)
Total Bilirubin: 1.4 mg/dL — ABNORMAL HIGH (ref 0.3–1.2)
Total Protein: 7.5 g/dL (ref 6.5–8.1)

## 2023-01-11 LAB — LACTIC ACID, PLASMA: Lactic Acid, Venous: 1.9 mmol/L (ref 0.5–1.9)

## 2023-01-11 LAB — SALICYLATE LEVEL: Salicylate Lvl: <7 mg/dL — ABNORMAL LOW (ref 7.0–30.0)

## 2023-01-11 LAB — TROPONIN I (HIGH SENSITIVITY)
Troponin I (High Sensitivity): 7 ng/L (ref <18)
Troponin I (High Sensitivity): 8 ng/L (ref <18)

## 2023-01-11 LAB — MAGNESIUM: Magnesium: 1.9 mg/dL (ref 1.7–2.4)

## 2023-01-11 LAB — ETHANOL: Alcohol, Ethyl (B): <10 mg/dL (ref <10)

## 2023-01-11 LAB — HEMOGLOBIN AND HEMATOCRIT, BLOOD
HCT: 26.7 % — ABNORMAL LOW (ref 36.0–46.0)
Hemoglobin: 9.1 g/dL — ABNORMAL LOW (ref 12.0–15.0)

## 2023-01-11 LAB — ACETAMINOPHEN LEVEL: Acetaminophen (Tylenol), Serum: 11 ug/mL (ref 10–30)

## 2023-01-11 LAB — LIPASE, BLOOD: Lipase: 37 U/L (ref 11–51)

## 2023-01-11 LAB — TYPE AND SCREEN
ABO/RH(D): B POS
Antibody Screen: NEGATIVE

## 2023-01-11 LAB — BETA-HYDROXYBUTYRIC ACID: Beta-Hydroxybutyric Acid: 7.7 mmol/L — ABNORMAL HIGH (ref 0.05–0.27)

## 2023-01-11 LAB — PHOSPHORUS: Phosphorus: 4.1 mg/dL (ref 2.5–4.6)

## 2023-01-11 MED ORDER — ACETAMINOPHEN 325 MG PO TABS
650.0000 mg | ORAL_TABLET | Freq: Four times a day (QID) | ORAL | Status: DC | PRN
  Administered 2023-01-12: 650 mg via ORAL
  Filled 2023-01-11 (×2): qty 2

## 2023-01-11 MED ORDER — THIAMINE HCL 100 MG PO TABS
100.0000 mg | ORAL_TABLET | Freq: Every day | ORAL | Status: DC
  Administered 2023-01-12 – 2023-01-13 (×2): 100 mg via ORAL
  Filled 2023-01-11 (×4): qty 1

## 2023-01-11 MED ORDER — POTASSIUM CHLORIDE 20 MEQ PO PACK
40.0000 meq | PACK | Freq: Once | ORAL | Status: AC
  Administered 2023-01-11: 40 meq via ORAL
  Filled 2023-01-11: qty 2

## 2023-01-11 MED ORDER — ACETAMINOPHEN 650 MG RE SUPP: 650.0000 mg | Freq: Four times a day (QID) | RECTAL | Status: DC | PRN

## 2023-01-11 MED ORDER — MOMETASONE FURO-FORMOTEROL FUM 200-5 MCG/ACT IN AERO
2.0000 | INHALATION_SPRAY | Freq: Two times a day (BID) | RESPIRATORY_TRACT | Status: DC
  Administered 2023-01-11 – 2023-01-12 (×3): 2 via RESPIRATORY_TRACT
  Filled 2023-01-11: qty 8.8

## 2023-01-11 MED ORDER — THIAMINE HCL 100 MG/ML IJ SOLN
500.0000 mg | Freq: Once | INTRAVENOUS | Status: AC
  Administered 2023-01-11: 500 mg via INTRAVENOUS
  Filled 2023-01-11: qty 5

## 2023-01-11 MED ORDER — HYDROMORPHONE HCL 1 MG/ML IJ SOLN
0.5000 mg | Freq: Once | INTRAMUSCULAR | Status: AC
  Administered 2023-01-11: 0.5 mg via INTRAVENOUS
  Filled 2023-01-11: qty 1

## 2023-01-11 MED ORDER — FOLIC ACID 1 MG PO TABS
1.0000 mg | ORAL_TABLET | Freq: Every day | ORAL | Status: DC
  Administered 2023-01-12 – 2023-01-13 (×2): 1 mg via ORAL
  Filled 2023-01-11 (×2): qty 1

## 2023-01-11 MED ORDER — PANTOPRAZOLE SODIUM 40 MG IV SOLR: 40.0000 mg | Freq: Two times a day (BID) | INTRAVENOUS | Status: DC

## 2023-01-11 MED ORDER — LORAZEPAM 1 MG PO TABS: 1.0000 mg | ORAL_TABLET | ORAL | Status: DC | PRN

## 2023-01-11 MED ORDER — LACTATED RINGERS IV BOLUS
1000.0000 mL | Freq: Once | INTRAVENOUS | Status: AC
  Administered 2023-01-11: 1000 mL via INTRAVENOUS

## 2023-01-11 MED ORDER — PANTOPRAZOLE 80MG IVPB - SIMPLE MED
80.0000 mg | Freq: Once | INTRAVENOUS | Status: AC
  Administered 2023-01-11: 80 mg via INTRAVENOUS
  Filled 2023-01-11: qty 100

## 2023-01-11 MED ORDER — LORAZEPAM 2 MG/ML IJ SOLN: 1.0000 mg | INTRAMUSCULAR | Status: DC | PRN

## 2023-01-11 MED ORDER — OXYCODONE-ACETAMINOPHEN 5-325 MG PO TABS
1.0000 | ORAL_TABLET | Freq: Four times a day (QID) | ORAL | Status: DC | PRN
  Administered 2023-01-11: 1 via ORAL
  Filled 2023-01-11: qty 1

## 2023-01-11 MED ORDER — ALBUTEROL SULFATE (2.5 MG/3ML) 0.083% IN NEBU: 2.5000 mg | INHALATION_SOLUTION | Freq: Four times a day (QID) | RESPIRATORY_TRACT | Status: DC | PRN

## 2023-01-11 MED ORDER — ATORVASTATIN CALCIUM 10 MG PO TABS
20.0000 mg | ORAL_TABLET | Freq: Every day | ORAL | Status: DC
  Administered 2023-01-12 – 2023-01-13 (×2): 20 mg via ORAL
  Filled 2023-01-11 (×2): qty 2

## 2023-01-11 MED ORDER — OXYCODONE-ACETAMINOPHEN 5-325 MG PO TABS
1.0000 | ORAL_TABLET | Freq: Once | ORAL | Status: AC
  Administered 2023-01-11: 1 via ORAL
  Filled 2023-01-11: qty 1

## 2023-01-11 MED ORDER — ADULT MULTIVITAMIN W/MINERALS CH
1.0000 | ORAL_TABLET | Freq: Every day | ORAL | Status: DC
  Administered 2023-01-11 – 2023-01-13 (×3): 1 via ORAL
  Filled 2023-01-11 (×3): qty 1

## 2023-01-11 MED ORDER — METOCLOPRAMIDE HCL 5 MG/ML IJ SOLN
10.0000 mg | Freq: Four times a day (QID) | INTRAMUSCULAR | Status: DC | PRN
  Administered 2023-01-11: 10 mg via INTRAVENOUS
  Filled 2023-01-11: qty 2

## 2023-01-11 MED ORDER — SODIUM CHLORIDE 0.9 % IV SOLN: INTRAVENOUS | Status: DC

## 2023-01-11 MED ORDER — PANTOPRAZOLE INFUSION (NEW) - SIMPLE MED
8.0000 mg/h | INTRAVENOUS | Status: DC
  Administered 2023-01-11: 8 mg/h via INTRAVENOUS
  Filled 2023-01-11 (×2): qty 100

## 2023-01-11 MED ORDER — SODIUM CHLORIDE 0.9% FLUSH
3.0000 mL | Freq: Two times a day (BID) | INTRAVENOUS | Status: DC
  Administered 2023-01-12 – 2023-01-13 (×3): 3 mL via INTRAVENOUS

## 2023-01-11 NOTE — H&P (Addendum)
History and Physical   Angel Hebert ZDG:[id number] DOB: 28-Aug-1956 DOA: 01/11/2023  PCP: [name], MD   Patient coming from: Home  Chief Complaint: Abdominal pain, dark stool  HPI: Angel Hebert is a 67 y.o. female with medical history significant of anemia, hypertension, hyperlipidemia, GERD, gastritis, asthma, pancreatitis, recent GI bleed secondary to duodenal ulcer presenting with abdominal pain and dark stool.  As above patient was admitted in February for GI bleeding and EGD showed duodenal ulcer.  Biopsies were H. pylori negative.  Not bleeding at the time of EGD.  On discharge she was recommended to abstain from alcohol use which was the likely etiology, was prescribed PPI, and plan for follow-up endoscopy 8 weeks later.  Has not yet had follow-up endoscopy done.  She has continued to drink on a daily basis (about a 40oz beer per day).  She is reporting about a week of generalized abdominal pain which is similar to her symptoms that she had prior to her presentation in February.  She denied dark stools initially however her significant other reported that she had black stool this morning.  She reports nausea and vomiting yesterday and today but denies any blood or coffee-ground emesis.  Abdominal pain reportedly as severe as 10 out of 10.  Further denies fevers, chills, chest pain, shortness of breath, constipation, diarrhea.  ED Course: Vital signs in the ED notable for blood pressure in the 90s to 100s systolic with 1 transient reading in the 80s that has since improved, heart rate in the 80s to 100s.  Lab workup included CMP with sodium 131, chloride 83, potassium 3.0, bicarb 15 with a gap of 33, BUN 46, creatinine elevated to 1.65 from baseline 0.9, glucose 115, T. bili 1.4.  CBC with hemoglobin initially 12.1 but recheck 5 hours later after IV fluids showed drop to 8.8.  PT, PTT, INR within normal limits.  Lipase normal.  FOBT positive.  Lactic acid pending.  Beta  hydroxybutyric acid elevated to 7.7.  Aspirin, Tylenol, ethanol level negative.  Patient typed and screened in the ED.  Urinalysis pending.  Magnesium normal.  CT abdomen pelvis without contrast showed no acute abnormality and stable chronic findings.  Patient received Dilaudid, oxycodone in the ED.  Also received Reglan and a liter of IV fluids as well as 40 mill equivalents p.o. potassium and was placed on a PPI drip.  GI consulted and agree with PPI drip, plan to transfuse for hemoglobin less than 7 and plan for EGD tomorrow.  Review of Systems: As per HPI otherwise all other systems reviewed and are negative.  Past Medical History:  Diagnosis Date   Arthritis    Asthma    last year in December   Dyspnea    GERD (gastroesophageal reflux disease)    High cholesterol    Hypertension    Pneumonia 01/2017    Past Surgical History:  Procedure Laterality Date   ABDOMINAL HYSTERECTOMY     BIOPSY  10/22/2022   Procedure: BIOPSY;  Surgeon: Tressia Danas, MD;  Location: [facility] ENDOSCOPY;  Service: Gastroenterology;;   ESOPHAGOGASTRODUODENOSCOPY (EGD) WITH PROPOFOL N/A 10/22/2022   Procedure: ESOPHAGOGASTRODUODENOSCOPY (EGD) WITH PROPOFOL;  Surgeon: Tressia Danas, MD;  Location: [facility] ENDOSCOPY;  Service: Gastroenterology;  Laterality: N/A;   FEMUR IM NAIL Left 04/17/2020   Procedure: INTRAMEDULLARY (IM) RETROGRADE FEMORAL NAILING;  Surgeon: Cammy Copa, MD;  Location: [facility] OR;  Service: Orthopedics;  Laterality: Left;   nasal polyps     removed just this past tuesday  at Outpt facility [facility] by [facility]   TOTAL KNEE ARTHROPLASTY Left 10/01/2016   Procedure: LEFT TOTAL KNEE ARTHROPLASTY;  Surgeon: Cammy Copa, MD;  Location: [facility] OR;  Service: Orthopedics;  Laterality: Left;    Social History  reports that she has never smoked. She has never used smokeless tobacco. She reports current alcohol use of about 1.0 standard drink of alcohol per week. She reports that she does not use  drugs.  No Known Allergies  Family History  Problem Relation Age of Onset   Diabetes Sister    Healthy Mother    Healthy Father    Colon cancer Neg Hx    Breast cancer Neg Hx   Reviewed on admission  Prior to Admission medications   Medication Sig Start Date End Date Taking? Authorizing Provider  albuterol (PROVENTIL) (2.5 MG/3ML) 0.083% nebulizer solution Take 3 mLs (2.5 mg total) by nebulization every 6 (six) hours as needed for wheezing or shortness of breath. Patient not taking: Reported on 10/21/2022 08/16/20   [name], MD  albuterol (VENTOLIN HFA) 108 (90 Base) MCG/ACT inhaler TAKE 2 PUFFS BY MOUTH EVERY 6  HOURS AS NEEDED FOR WHEEZE OR  SHORTNESS OF BREATH 12/25/22   [name], MD  atorvastatin (LIPITOR) 20 MG tablet TAKE 1 TABLET BY MOUTH DAILY AT 6PM Patient taking differently: Take 20 mg by mouth daily. 08/07/22   [name], MD  fluticasone-salmeterol (WIXELA INHUB) 500-50 MCG/ACT AEPB INHALE 1 PUFF INTO THE LUNGS IN THE MORNING AND AT BEDTIME. Patient taking differently: Inhale 1 puff into the lungs in the morning and at bedtime. 05/08/22   [name], MD  folic acid (FOLVITE) 1 MG tablet Take 1 tablet (1 mg total) by mouth daily. 12/12/22   [name], MD  HYDROcodone-acetaminophen (NORCO/VICODIN) 5-325 MG tablet Take 1 tablet by mouth every 6 (six) hours as needed for severe pain. 10/25/22   Elgergawy, Leana Roe, MD  HYDROcodone-acetaminophen (NORCO/VICODIN) 5-325 MG tablet Take 1 tablets by mouth every 6 (six) hours as needed for severe pain. 10/25/22   Elgergawy, Leana Roe, MD  losartan-hydrochlorothiazide (HYZAAR) 100-25 MG tablet Take 1 tablet by mouth daily. 12/12/22   [name], MD  pantoprazole (PROTONIX) 40 MG tablet Take 1 tablet (40 mg total) by mouth 2 (two) times daily. 12/12/22   [name], MD  thiamine (VITAMIN B1) 100 MG tablet Take 1 tablet (100 mg total) by mouth daily. 12/12/22   [name], MD  traZODone (DESYREL) 50 MG  tablet Take 1 tablet (50 mg total) by mouth at bedtime as needed for sleep. 07/21/19 09/16/19  Glade Lloyd, MD    Physical Exam: Vitals:   01/11/23 1605 01/11/23 1715 01/11/23 1730 01/11/23 1745  BP:  96/68 (!) 90/58 90/70  Pulse:  93 92 89  Resp:  17 14 15   Temp: 98.2 F (36.8 C)     TempSrc: Oral     SpO2:  100% 100% 98%  Weight:      Height:        Physical Exam Constitutional:      General: She is not in acute distress.    Appearance: Normal appearance.  HENT:     Head: Normocephalic and atraumatic.     Mouth/Throat:     Mouth: Mucous membranes are moist.     Pharynx: Oropharynx is clear.  Eyes:     Extraocular Movements: Extraocular movements intact.     Pupils: Pupils are equal, round, and reactive to light.  Cardiovascular:  Rate and Rhythm: Normal rate and regular rhythm.     Pulses: Normal pulses.     Heart sounds: Normal heart sounds.  Pulmonary:     Effort: Pulmonary effort is normal. No respiratory distress.     Breath sounds: Normal breath sounds.  Abdominal:     General: Bowel sounds are normal. There is no distension.     Palpations: Abdomen is soft.     Tenderness: There is abdominal tenderness.  Musculoskeletal:        General: No swelling or deformity.  Skin:    General: Skin is warm and dry.  Neurological:     General: No focal deficit present.     Mental Status: Mental status is at baseline.    Labs on Admission: I have personally reviewed following labs and imaging studies  CBC: Recent Labs  Lab 01/11/23 1041 01/11/23 1519 01/11/23 1543  WBC 8.7  --  8.2  HGB 12.1 11.6* 8.8*  HCT 36.4 34.0* 25.6*  MCV 99.5  --  97.3  PLT 387  --  304    Basic Metabolic Panel: Recent Labs  Lab 01/11/23 1041 01/11/23 1500 01/11/23 1519  NA 131*  --  129*  K 3.0*  --  3.2*  CL 83*  --  94*  CO2 15*  --   --   GLUCOSE 115*  --  142*  BUN 46*  --  49*  CREATININE 1.65*  --  1.50*  CALCIUM 9.0  --   --   MG  --  1.9  --      GFR: Estimated Creatinine Clearance: 34.5 mL/min (A) (by C-G formula based on SCr of 1.5 mg/dL (H)).  Liver Function Tests: Recent Labs  Lab 01/11/23 1041  AST 29  ALT 13  ALKPHOS 87  BILITOT 1.4*  PROT 7.5  ALBUMIN 3.5    Urine analysis:    Component Value Date/Time   COLORURINE YELLOW 07/25/2021 1000   APPEARANCEUR TURBID (A) 07/25/2021 1000   LABSPEC <1.005 (L) 07/25/2021 1000   PHURINE 7.0 07/25/2021 1000   GLUCOSEU NEGATIVE 07/25/2021 1000   HGBUR MODERATE (A) 07/25/2021 1000   BILIRUBINUR SMALL (A) 07/25/2021 1000   KETONESUR NEGATIVE 07/25/2021 1000   PROTEINUR NEGATIVE 07/25/2021 1000   UROBILINOGEN 0.2 10/08/2012 1224   NITRITE NEGATIVE 07/25/2021 1000   LEUKOCYTESUR LARGE (A) 07/25/2021 1000    Radiological Exams on Admission: CT ABDOMEN PELVIS WO CONTRAST  Result Date: 01/11/2023 CLINICAL DATA:  Abdominal pain. EXAM: CT ABDOMEN AND PELVIS WITHOUT CONTRAST TECHNIQUE: Multidetector CT imaging of the abdomen and pelvis was performed following the standard protocol without IV contrast. RADIATION DOSE REDUCTION: This exam was performed according to the departmental dose-optimization program which includes automated exposure control, adjustment of the mA and/or kV according to patient size and/or use of iterative reconstruction technique. COMPARISON:  CTA GI bleed 10/21/2022; CT abdomen pelvis 05/22/2021 FINDINGS: Lower chest: No acute abnormality. Subsegmental atelectasis versus scarring at the lung bases. Hepatobiliary: No focal liver abnormality is seen. No gallstones, gallbladder wall thickening, or biliary dilatation. Pancreas: Unremarkable. No pancreatic ductal dilatation or surrounding inflammatory changes. Spleen: Normal in size without focal abnormality. Adrenals/Urinary Tract: Adrenal glands are unremarkable. Kidneys are normal, without renal calculi, focal lesion, or hydronephrosis. Bladder is unremarkable. Stomach/Bowel: Stomach is within normal limits.  Appendix is not definitely visualized, however there are no pericecal inflammatory changes. No evidence of bowel wall thickening, distention, or inflammatory changes. Vascular/Lymphatic: Aortic atherosclerosis. No enlarged abdominal or pelvic lymph nodes.  Reproductive: Status post hysterectomy. No adnexal masses. Other: Small fat containing umbilical hernia. No abdominopelvic ascites. Musculoskeletal: No acute or suspicious osseous findings. Chronic compression fracture of the L2 and L3 vertebral bodies. Severe degenerative disc disease throughout the lumbar spine. Stable grade 1 anterolisthesis of L4 on L5. IMPRESSION: 1. No acute abnormality in the abdomen or pelvis. 2. Stable chronic findings as noted in the body of the report. 3. Aortic atherosclerosis. Aortic Atherosclerosis (ICD10-I70.0). Electronically Signed   By: Sherron Ales M.D.   On: 01/11/2023 15:02    EKG: Not performed this admission  Assessment/Plan Principal Problem:   GI bleed Active Problems:   Hyperlipidemia   Essential hypertension   Asthma   GERD   Anemia, iron deficiency   Hypokalemia   Gastritis   AKI (acute kidney injury) (HCC)   Hyponatremia   Duodenal ulcer   Alcoholic ketosis (HCC)   GI bleed Anemia GERD Gastritis Peptic ulcer disease Abdominal pain > Patient presenting with a week of abdominal pain.  Significant other noted black stool today. > Recent admission.  HPI for bleeding duodenal ulcer that was nonbleeding by the time EGD was done. > Etiology of previous bleeding believed to be alcohol use as it was H. pylori negative and had chronic gastritis. > She was instructed to abstain from alcohol use but continues to drink daily. > Has had nausea and vomiting but no hematemesis. > Hemoglobin initially stable at 12 but on repeat had dropped to 8.8 this was after liter fluids of partial delusional component.  Type and screen performed in the ED.  FOBT positive. > GI consulted and plan for EGD tomorrow.   Patient received pain medication, started on IV PPI drip, Reglan, IV fluids in the ED.  To have transient hypotension which has been fluid responsive. - Monitor on progressive unit - Appreciate GI recommendations - Continue with PPI drip - Continue with IV fluids overnight - Clear liquid diet for now, n.p.o. at midnight - Trend hemoglobin every 6 hours overnight  Hypokalemia Hyponatremia AKI > Sodium 131, potassium 3.0, creatinine elevated to 1.65 from baseline 0.9. > Likely related to decreased p.o. intake and alcohol use as below. > 40 mill equivalents p.o. potassium in the ED.  Magnesium normal.  1L IV Fluids received in the ED.  - Give additional 40 mill equivalents p.o. potassium - Continue with IV fluids overnight. - Trend renal function and electrolytes   Alcohol use Alcoholic ketoacidosis High anion gap metabolic acidosis > Bicarb of 15 with gap of 33.  Noted to have beta hydroxybutyric acid of 7.7.  Lactic acid pending. > All believed to be secondary to alcoholic ketoacidosis.  Even if elevation in lactic acid this may be secondary to alcoholic ketoacidosis as well. > Has continued to be a daily alcohol user despite recent bleeding issues.  Drinks about 1 40oz beer for day. > Some non-severe withdrawal symptoms during prior admission per discharge summary. - Monitoring on progressive as above - MedSurg CIWA with Ativan protocol - 500 mg IV thiamine - Continue daily thiamine, folate, multivitamin - Check phosphorus level - Continue with IV fluids overnight  Asthma - Replace home Wixela with formulary Dulera - As needed albuterol  Hypertension - Holding home losartan-hydrochlorothiazide in the setting of low normal blood pressure  Hyperlipidemia - Continue home atorvastatin  DVT prophylaxis: SCDs Code Status:   Full Family Communication:  Updated at bedside  Disposition Plan:   Patient is from:  Home  Anticipated DC to:  Home  Anticipated DC date:  2 to 5  days  Anticipated DC barriers: None  Consults called:  Gastroenterology Admission status:  Inpatient, progressive  Severity of Illness: The appropriate patient status for this patient is INPATIENT. Inpatient status is judged to be reasonable and necessary in order to provide the required intensity of service to ensure the patient's safety. The patient's presenting symptoms, physical exam findings, and initial radiographic and laboratory data in the context of their chronic comorbidities is felt to place them at high risk for further clinical deterioration. Furthermore, it is not anticipated that the patient will be medically stable for discharge from the hospital within 2 midnights of admission.   * I certify that at the point of admission it is my clinical judgment that the patient will require inpatient hospital care spanning beyond 2 midnights from the point of admission due to high intensity of service, high risk for further deterioration and high frequency of surveillance required.Synetta Fail MD [facility]  How to contact the [facility] Attending or Consulting provider 7A - 7P or covering provider during after hours 7P -7A, for this patient?   Check the care team in [facility] and look for a) attending/consulting [facility] provider listed and b) the [facility] team listed Log into [email or web address] and use [place]'s universal password to access. If you do not have the password, please contact the hospital operator. Locate the [facility] provider you are looking for under [facility] and page to a number that you can be directly reached. If you still have difficulty reaching the provider, please page the [facility] (Director on Call) for the Hospitalists listed on amion for assistance.  01/11/2023, 6:17 PM

## 2023-01-11 NOTE — ED Triage Notes (Signed)
Patient was here in feb for GI bleed.  Patient reports started having abd pain again and noticed blood in stool this am.  Patient drinks a few beers everyday per family.

## 2023-01-11 NOTE — H&P (View-Only) (Signed)
Consultation  Referring Provider:   Dr. Durwin Nora, ER Primary Care Physician:  [name], MD Primary Gastroenterologist:  Dr. Lavon Paganini       Reason for Consultation:     Melena         HPI:   Angel Hebert is a 67 y.o. female with past medical history significant for adenomatous colon polyps, diverticulosis, hypertension, hyperlipidemia, asthma, ETOH abuse, GERD, bilateral knee osteoarthritis  CHF (EF 45 to 50%), acute on chronic anemia with recent admission 10/22/2022 for duodenal ulcer negative H. pylori gastritis, presents to the ER with AKI.   GI consulted for ongoing abdominal pain and report of melenic stools. 10/21/2022 patient admitted with acute on chronic GI bleed, electrolyte imbalances with hyponatremia and hypokalemia and reported weight loss. CTA last admission negative for GI bleed, no notable celiac/CMA or IMA narrowing. 10/22/2022 EGD with Dr. Orvan Falconer normal esophagus, normal stomach, nonbleeding duodenal ulcer with no stigmata of bleeding.  This was to have repeat upper endoscopy in 8 weeks to monitor for healing.  Chronic gastritis negative H. pylori, thought secondary to EtOH use. 12/2015 colonoscopy showed 11 mm polyp cecum, 4 mm polyp transverse colon, 7 mm polyp sigmoid colon, diverticulosis, nonbleeding internal hemorrhoids pathology showed all tubular adenomatous polyps recall colonoscopy 3 years.  Labs in the ER showed AKI with BUN 49, creatinine 1.56 ( baseline bun 15 and Cr.71), sodium 129, potassium 3.2, glucose 142 Total bilirubin slightly elevated at 1.4, AST 29, ALT 13, alk phos 87, lipase 37, albumin 3.5.  INR unremarkable. Patient had intermittent anemia since 2017, with hemoglobin baseline being around 8-9.  10/25/2022 on discharge hemoglobin 8.7. Currently on admission hemoglobin 12.1, MCV 99.5, platelets 37, WBC 8.7.  I am assuming this is hemoconcentrated and will go down with IVF. Positive FOBT. Pending ethanol, acetaminophen level, salicylate  level, lactic acid. CT abdomen pelvis without contrast due to kidney function for abdominal pain shows no focal level abnormality, no gallstones, unremarkable pancreas, normal spleen, stomach is within normal limits.  No evidence of bowel wall thickening, distention or inflammatory changes.  Chronic compression fracture, aortic atherosclerosis.  Fiance Jonny Ruiz is here with her, give the majority of the history. Patient states she was in her normal state of health since discharge.   She did run out of her pantoprazole a month ago.   She has continued drinking at least one 40 ounce beer daily with liquor 3-4 times a week, has also been taking ibuprofen for bilateral leg pain 200 mg 2 pills twice daily. About 3 days ago began to have abdominal discomfort, begin to have loose stools without hematochezia.  About 2 daily.  No nocturnal symptoms. Then yesterday evening began having black watery stools, had 2 more episodes this morning, 1 in the bathroom.  No further bowel movements.. Patient is also started having nausea and vomiting this morning, potentially coffee-ground emesis in the vomit, states it was brown. She has had decreased appetite and poor p.o. intake, states she has had weight loss, difficult to quantify. Patient does deny any hematochezia, rectal pain. She has not had any associated dizziness, shortness of breath or chest pain.  Denies smoking or drug history. Denies family history of stomach or colon cancer. No history of antibiotic use recently, no sick contacts.  Abnormal ED labs: Abnormal Labs Reviewed  COMPREHENSIVE METABOLIC PANEL - Abnormal; Notable for the following components:      Result Value   Sodium 131 (*)    Potassium 3.0 (*)  Chloride 83 (*)    CO2 15 (*)    Glucose, Bld 115 (*)    BUN 46 (*)    Creatinine, Ser 1.65 (*)    Total Bilirubin 1.4 (*)    GFR, Estimated 34 (*)    Anion gap 33 (*)    All other components within normal limits  CBC - Abnormal; Notable  for the following components:   RBC 3.66 (*)    RDW 16.9 (*)    All other components within normal limits  POC OCCULT BLOOD, ED - Abnormal; Notable for the following components:   Fecal Occult Bld POSITIVE (*)    All other components within normal limits  I-STAT CHEM 8, ED - Abnormal; Notable for the following components:   Sodium 129 (*)    Potassium 3.2 (*)    Chloride 94 (*)    BUN 49 (*)    Creatinine, Ser 1.50 (*)    Glucose, Bld 142 (*)    Calcium, Ion 1.13 (*)    TCO2 17 (*)    Hemoglobin 11.6 (*)    HCT 34.0 (*)    All other components within normal limits     Past Medical History:  Diagnosis Date   Arthritis    Asthma    last year in December   Dyspnea    GERD (gastroesophageal reflux disease)    High cholesterol    Hypertension    Pneumonia 01/2017    Surgical History:  She  has a past surgical history that includes Abdominal hysterectomy; nasal polyps; Total knee arthroplasty (Left, 10/01/2016); Femur IM nail (Left, 04/17/2020); Esophagogastroduodenoscopy (egd) with propofol (N/A, 10/22/2022); and biopsy (10/22/2022). Family History:  Her family history includes Diabetes in her sister; Healthy in her father and mother. Social History:   reports that she has never smoked. She has never used smokeless tobacco. She reports current alcohol use of about 1.0 standard drink of alcohol per week. She reports that she does not use drugs.  Prior to Admission medications   Medication Sig Start Date End Date Taking? Authorizing Provider  albuterol (PROVENTIL) (2.5 MG/3ML) 0.083% nebulizer solution Take 3 mLs (2.5 mg total) by nebulization every 6 (six) hours as needed for wheezing or shortness of breath. Patient not taking: Reported on 10/21/2022 08/16/20   [name], MD  albuterol (VENTOLIN HFA) 108 (90 Base) MCG/ACT inhaler TAKE 2 PUFFS BY MOUTH EVERY 6  HOURS AS NEEDED FOR WHEEZE OR  SHORTNESS OF BREATH 12/25/22   [name], MD  atorvastatin (LIPITOR) 20 MG tablet  TAKE 1 TABLET BY MOUTH DAILY AT 6PM Patient taking differently: Take 20 mg by mouth daily. 08/07/22   [name], MD  fluticasone-salmeterol (WIXELA INHUB) 500-50 MCG/ACT AEPB INHALE 1 PUFF INTO THE LUNGS IN THE MORNING AND AT BEDTIME. Patient taking differently: Inhale 1 puff into the lungs in the morning and at bedtime. 05/08/22   [name], MD  folic acid (FOLVITE) 1 MG tablet Take 1 tablet (1 mg total) by mouth daily. 12/12/22   [name], MD  HYDROcodone-acetaminophen (NORCO/VICODIN) 5-325 MG tablet Take 1 tablet by mouth every 6 (six) hours as needed for severe pain. 10/25/22   Elgergawy, Leana Roe, MD  HYDROcodone-acetaminophen (NORCO/VICODIN) 5-325 MG tablet Take 1 tablets by mouth every 6 (six) hours as needed for severe pain. 10/25/22   Elgergawy, Leana Roe, MD  losartan-hydrochlorothiazide (HYZAAR) 100-25 MG tablet Take 1 tablet by mouth daily. 12/12/22   [name], MD  pantoprazole (PROTONIX) 40 MG tablet  Take 1 tablet (40 mg total) by mouth 2 (two) times daily. 12/12/22   [name], MD  thiamine (VITAMIN B1) 100 MG tablet Take 1 tablet (100 mg total) by mouth daily. 12/12/22   [name], MD  traZODone (DESYREL) 50 MG tablet Take 1 tablet (50 mg total) by mouth at bedtime as needed for sleep. 07/21/19 09/16/19  Glade Lloyd, MD    Current Facility-Administered Medications  Medication Dose Route Frequency Provider Last Rate Last Admin   lactated ringers bolus 1,000 mL  1,000 mL Intravenous Once Gloris Manchester, MD       metoCLOPramide (REGLAN) injection 10 mg  10 mg Intravenous Q6H PRN Gloris Manchester, MD   10 mg at 01/11/23 1417   [START ON 01/14/2023] pantoprazole (PROTONIX) injection 40 mg  40 mg Intravenous Q12H Gloris Manchester, MD       pantoprozole (PROTONIX) 80 mg /NS 100 mL infusion  8 mg/hr Intravenous Continuous Gloris Manchester, MD 10 mL/hr at 01/11/23 1507 8 mg/hr at 01/11/23 1507   potassium chloride (KLOR-CON) packet 40 mEq  40 mEq Oral Once Gloris Manchester, MD        Current Outpatient Medications  Medication Sig Dispense Refill   albuterol (PROVENTIL) (2.5 MG/3ML) 0.083% nebulizer solution Take 3 mLs (2.5 mg total) by nebulization every 6 (six) hours as needed for wheezing or shortness of breath. (Patient not taking: Reported on 10/21/2022) 75 mL 3   albuterol (VENTOLIN HFA) 108 (90 Base) MCG/ACT inhaler TAKE 2 PUFFS BY MOUTH EVERY 6  HOURS AS NEEDED FOR WHEEZE OR  SHORTNESS OF BREATH 34 g 2   atorvastatin (LIPITOR) 20 MG tablet TAKE 1 TABLET BY MOUTH DAILY AT 6PM (Patient taking differently: Take 20 mg by mouth daily.) 90 tablet 0   fluticasone-salmeterol (WIXELA INHUB) 500-50 MCG/ACT AEPB INHALE 1 PUFF INTO THE LUNGS IN THE MORNING AND AT BEDTIME. (Patient taking differently: Inhale 1 puff into the lungs in the morning and at bedtime.) 60 each 1   folic acid (FOLVITE) 1 MG tablet Take 1 tablet (1 mg total) by mouth daily. 30 tablet 0   HYDROcodone-acetaminophen (NORCO/VICODIN) 5-325 MG tablet Take 1 tablet by mouth every 6 (six) hours as needed for severe pain. 10 tablet 0   HYDROcodone-acetaminophen (NORCO/VICODIN) 5-325 MG tablet Take 1 tablets by mouth every 6 (six) hours as needed for severe pain. 10 tablet 0   losartan-hydrochlorothiazide (HYZAAR) 100-25 MG tablet Take 1 tablet by mouth daily. 30 tablet 0   pantoprazole (PROTONIX) 40 MG tablet Take 1 tablet (40 mg total) by mouth 2 (two) times daily. 60 tablet 0   thiamine (VITAMIN B1) 100 MG tablet Take 1 tablet (100 mg total) by mouth daily. 30 tablet 0    Allergies as of 01/11/2023   (No Known Allergies)    Review of Systems:    Review of Systems  Constitutional:  Positive for weight loss. Negative for chills, fever and malaise/fatigue.  Respiratory:  Negative for cough and shortness of breath.   Cardiovascular:  Negative for chest pain and leg swelling.  Gastrointestinal:  Positive for abdominal pain, diarrhea, heartburn, melena, nausea and vomiting. Negative for blood in stool and  constipation.  Musculoskeletal:  Positive for myalgias.  Neurological:  Positive for dizziness. Negative for focal weakness.       Physical Exam:  Vital signs in last 24 hours: Temp:  [97.7 F (36.5 C)-98.6 F (37 C)] 98.6 F (37 C) (05/11 1441) Pulse Rate:  [91-111] 91 (05/11 1515) Resp:  [15-20]  15 (05/11 1515) BP: (74-112)/(50-77) 77/54 (05/11 1515) SpO2:  [94 %-100 %] 99 % (05/11 1515) Weight:  [68 kg] 68 kg (05/11 1028)   Last BM recorded by nurses in past 5 days No data recorded  General: Thin, chronically ill-appearing AA female Head:  Normocephalic and atraumatic.  Poor dentition Eyes: sclerae anicteric,conjunctive pink  Heart:  regular rate and rhythm, no murmurs rubs or gallops but loud S2 Pulm: Clear anteriorly; no wheezing Abdomen:  Soft, Obese AB, Active bowel sounds. mild tenderness in the epigastrium. Without guarding and Without rebound, No organomegaly appreciated. Extremities:  Without edema. Msk:  Symmetrical without gross deformities. Peripheral pulses intact.  Neurologic:  Alert and  oriented x4;  No focal deficits.  Skin:   Dry and intact without significant lesions or rashes. Psychiatric:  Cooperative. Normal mood and affect.  LAB RESULTS: Recent Labs    01/11/23 1041 01/11/23 1519  WBC 8.7  --   HGB 12.1 11.6*  HCT 36.4 34.0*  PLT 387  --    BMET Recent Labs    01/11/23 1041 01/11/23 1519  NA 131* 129*  K 3.0* 3.2*  CL 83* 94*  CO2 15*  --   GLUCOSE 115* 142*  BUN 46* 49*  CREATININE 1.65* 1.50*  CALCIUM 9.0  --    LFT Recent Labs    01/11/23 1041  PROT 7.5  ALBUMIN 3.5  AST 29  ALT 13  ALKPHOS 87  BILITOT 1.4*   PT/INR Recent Labs    01/11/23 1500  LABPROT 13.8  INR 1.0    STUDIES: CT ABDOMEN PELVIS WO CONTRAST  Result Date: 01/11/2023 CLINICAL DATA:  Abdominal pain. EXAM: CT ABDOMEN AND PELVIS WITHOUT CONTRAST TECHNIQUE: Multidetector CT imaging of the abdomen and pelvis was performed following the standard  protocol without IV contrast. RADIATION DOSE REDUCTION: This exam was performed according to the departmental dose-optimization program which includes automated exposure control, adjustment of the mA and/or kV according to patient size and/or use of iterative reconstruction technique. COMPARISON:  CTA GI bleed 10/21/2022; CT abdomen pelvis 05/22/2021 FINDINGS: Lower chest: No acute abnormality. Subsegmental atelectasis versus scarring at the lung bases. Hepatobiliary: No focal liver abnormality is seen. No gallstones, gallbladder wall thickening, or biliary dilatation. Pancreas: Unremarkable. No pancreatic ductal dilatation or surrounding inflammatory changes. Spleen: Normal in size without focal abnormality. Adrenals/Urinary Tract: Adrenal glands are unremarkable. Kidneys are normal, without renal calculi, focal lesion, or hydronephrosis. Bladder is unremarkable. Stomach/Bowel: Stomach is within normal limits. Appendix is not definitely visualized, however there are no pericecal inflammatory changes. No evidence of bowel wall thickening, distention, or inflammatory changes. Vascular/Lymphatic: Aortic atherosclerosis. No enlarged abdominal or pelvic lymph nodes. Reproductive: Status post hysterectomy. No adnexal masses. Other: Small fat containing umbilical hernia. No abdominopelvic ascites. Musculoskeletal: No acute or suspicious osseous findings. Chronic compression fracture of the L2 and L3 vertebral bodies. Severe degenerative disc disease throughout the lumbar spine. Stable grade 1 anterolisthesis of L4 on L5. IMPRESSION: 1. No acute abnormality in the abdomen or pelvis. 2. Stable chronic findings as noted in the body of the report. 3. Aortic atherosclerosis. Aortic Atherosclerosis (ICD10-I70.0). Electronically Signed   By: Sherron Ales M.D.   On: 01/11/2023 15:02      Impression    Melena with abdominal pain duodenal ulcer 10/2022, negative H. pylori, thought to be due to EtOH 2017 last colonoscopy 3  adenomatous polyps recall 3 years Hemoglobin likely hemoconcentrated 12.8, will anticipate decreasing after IVF.  Baseline appears to be closer to  9. Patient supposed to have follow-up EGD 8 weeks, has not had follow-up with her office.  Has ran out of pantoprazole in the last month, has continued alcohol use daily, ibuprofen 4 pills daily.  AKI with hyponatremia, hypokalemia Likely secondary to poor p.o. intake and GI losses No history of antibiotic use, no history of sick contacts Continues to have diarrhea via can consider testing stool for C. difficile/GI pathogen panel but I believe this is more due to alcohol use.  Alcohol abuse No evidence of cirrhosis on CT, normal spleen, normal platelets  CHF EF 45 to 50% in 2020, grade 1 diastolic dysfunction mild MR No shortness of breath, no leg swelling.  History of adenomatous polyps Last colonoscopy 2017, recall 2020.  Had 3 adenomatous polyps No hematochezia CT abdomen pelvis last visit this visit without bowel wall thickening or evidence of lymphadenopathy.   LOS: 0 days     Plan   -Continue supportive care with IVF -Protonix infusion with drip -Clear liquid diet, NPO at midnight. -Expecting her hemoglobin to drop with IVF. Recheck H/H now and in 6 hours, CBC in the morning  -Continue to monitor H&H with transfusion as needed to maintain hemoglobin greater than 7. -Therapeutic EGD tomorrow to evaluate for bleeding peptic ulcer disease in setting of NSAIDs and EtOH.  I thoroughly discussed the procedure to include nature, alternatives, benefits, and risks including but not limited to bleeding, perforation, infection, anesthesia/cardiac and pulmonary complications. Patient provides understanding and gave verbal consent to proceed. --Empiric treatment with folic acid, multivitamin and thiamine.  -Alcohol Abstinence counseling discussed with patient -Monitor for withdrawal symptoms while inpatient -At this time I do not believe the  patient would be able to drink the prep, will need colonoscopy for further evaluation as patient is overdue, weight loss, abdominal pain.  Uncertain patient's reliable enough for outpatient colonoscopy, consider inpatient if patient is willing.  Thank you for your kind consultation, we will continue to follow.   Doree Albee  01/11/2023, 3:34 PM    Attending Physician Note   I have taken a history, reviewed the chart and examined the patient. I performed a substantive portion of this encounter, including complete performance of at least one of the key components, in conjunction with the APP. I agree with the APP's note, impression and recommendations with my edits. My additional impressions and recommendations are as follows.   Melena, periumbilical abdominal pain, N/V (without hematemesis). History of a DU, H pylori negative, in Feb 2024. Highly likely recurrent ulcer. R/O MW tear, AVM, other causes. IV pantoprazole bolus and infusion. Patient counseled to discontinue NSAIDs and Etoh. EGD tomorrow.   ABL on chronic anemia. Anticipate further Hgb drop with hydration. Trend CBC. Transfusions to maintain Hgb > 7.   History of adenomatous colon polyps, overdue for surveillance. Colonoscopy after acute problem has stabilized either as inpatient or outpatient.   Etoh abuse. Discontinue Etoh.  AKI, hyponatremia, hypokalemia  - suspect dehydration. IVFs and per primary service.  Claudette Head, MD [facility], for our on call provider

## 2023-01-11 NOTE — ED Provider Notes (Signed)
[place] EMERGENCY DEPARTMENT AT [facility] Provider Note   CSN: [id number] Arrival date & time: 01/11/23  1018     History  Chief Complaint  Patient presents with   Abdominal Pain   GI Bleeding    Angel Hebert is a 67 y.o. female.   Abdominal Pain Associated symptoms: nausea and vomiting   Patient presents for abdominal pain and dark stool.  Medical history includes arthritis, asthma, GERD, HLD, HTN, alcohol induced pancreatitis, duodenal ulcer.  She was hospitalized in February for GI bleeding.  EGD at that time showed a nonbleeding duodenal ulcer.  GI recommendations were for PPI and repeat endoscopy in 8 weeks.  She was advised to discontinue alcohol use.  Currently, she does continue to drink daily.  Over the past week, she has had a generalized abdominal pain.  She describes it as similar to her episode in February.  Although she has not noticed dark stools, her significant other did notice dark stools this morning.  He describes it as black in color.  She is not on any blood thinners.  Patient had nausea and vomiting last night and this morning.  Patient and significant other deny blood or coffee-ground emesis.  She denies any current nausea.  Current pain is 10/10 in severity.     Home Medications Prior to Admission medications   Medication Sig Start Date End Date Taking? Authorizing Provider  albuterol (PROVENTIL) (2.5 MG/3ML) 0.083% nebulizer solution Take 3 mLs (2.5 mg total) by nebulization every 6 (six) hours as needed for wheezing or shortness of breath. Patient not taking: Reported on 10/21/2022 08/16/20   [name], MD  albuterol (VENTOLIN HFA) 108 (90 Base) MCG/ACT inhaler TAKE 2 PUFFS BY MOUTH EVERY 6  HOURS AS NEEDED FOR WHEEZE OR  SHORTNESS OF BREATH 12/25/22   [name], MD  atorvastatin (LIPITOR) 20 MG tablet TAKE 1 TABLET BY MOUTH DAILY AT 6PM Patient taking differently: Take 20 mg by mouth daily. 08/07/22   [name], MD   fluticasone-salmeterol (WIXELA INHUB) 500-50 MCG/ACT AEPB INHALE 1 PUFF INTO THE LUNGS IN THE MORNING AND AT BEDTIME. Patient taking differently: Inhale 1 puff into the lungs in the morning and at bedtime. 05/08/22   [name], MD  folic acid (FOLVITE) 1 MG tablet Take 1 tablet (1 mg total) by mouth daily. 12/12/22   [name], MD  HYDROcodone-acetaminophen (NORCO/VICODIN) 5-325 MG tablet Take 1 tablet by mouth every 6 (six) hours as needed for severe pain. 10/25/22   Elgergawy, Leana Roe, MD  HYDROcodone-acetaminophen (NORCO/VICODIN) 5-325 MG tablet Take 1 tablets by mouth every 6 (six) hours as needed for severe pain. 10/25/22   Elgergawy, Leana Roe, MD  losartan-hydrochlorothiazide (HYZAAR) 100-25 MG tablet Take 1 tablet by mouth daily. 12/12/22   [name], MD  pantoprazole (PROTONIX) 40 MG tablet Take 1 tablet (40 mg total) by mouth 2 (two) times daily. 12/12/22   [name], MD  thiamine (VITAMIN B1) 100 MG tablet Take 1 tablet (100 mg total) by mouth daily. 12/12/22   [name], MD  traZODone (DESYREL) 50 MG tablet Take 1 tablet (50 mg total) by mouth at bedtime as needed for sleep. 07/21/19 09/16/19  Glade Lloyd, MD      Allergies    Patient has no known allergies.    Review of Systems   Review of Systems  Gastrointestinal:  Positive for abdominal pain, blood in stool, nausea and vomiting.  All other systems reviewed and are negative.   Physical  Exam Updated Vital Signs BP 112/65   Pulse 87   Temp 98.2 F (36.8 C) (Oral)   Resp 15   Ht 5\' 6"  (1.676 m)   Wt 68 kg   SpO2 99%   BMI 24.21 kg/m  Physical Exam Vitals and nursing note reviewed.  Constitutional:      General: She is not in acute distress.    Appearance: She is well-developed. She is ill-appearing (Chronically). She is not toxic-appearing or diaphoretic.  HENT:     Head: Normocephalic and atraumatic.     Mouth/Throat:     Mouth: Mucous membranes are moist.  Eyes:     Conjunctiva/sclera:  Conjunctivae normal.  Cardiovascular:     Rate and Rhythm: Normal rate and regular rhythm.  Pulmonary:     Effort: Pulmonary effort is normal. No respiratory distress.  Abdominal:     Palpations: Abdomen is soft.     Tenderness: There is no abdominal tenderness.  Musculoskeletal:        General: No swelling.     Cervical back: Neck supple.  Skin:    General: Skin is warm and dry.     Coloration: Skin is not cyanotic, jaundiced or pale.  Neurological:     General: No focal deficit present.     Mental Status: She is alert and oriented to person, place, and time.  Psychiatric:        Mood and Affect: Mood normal.        Behavior: Behavior normal.     ED Results / Procedures / Treatments   Labs (all labs ordered are listed, but only abnormal results are displayed) Labs Reviewed  COMPREHENSIVE METABOLIC PANEL - Abnormal; Notable for the following components:      Result Value   Sodium 131 (*)    Potassium 3.0 (*)    Chloride 83 (*)    CO2 15 (*)    Glucose, Bld 115 (*)    BUN 46 (*)    Creatinine, Ser 1.65 (*)    Total Bilirubin 1.4 (*)    GFR, Estimated 34 (*)    Anion gap 33 (*)    All other components within normal limits  CBC - Abnormal; Notable for the following components:   RBC 3.66 (*)    RDW 16.9 (*)    All other components within normal limits  BETA-HYDROXYBUTYRIC ACID - Abnormal; Notable for the following components:   Beta-Hydroxybutyric Acid 7.70 (*)    All other components within normal limits  SALICYLATE LEVEL - Abnormal; Notable for the following components:   Salicylate Lvl <7.0 (*)    All other components within normal limits  CBC - Abnormal; Notable for the following components:   RBC 2.63 (*)    Hemoglobin 8.8 (*)    HCT 25.6 (*)    RDW 17.0 (*)    All other components within normal limits  POC OCCULT BLOOD, ED - Abnormal; Notable for the following components:   Fecal Occult Bld POSITIVE (*)    All other components within normal limits  I-STAT  CHEM 8, ED - Abnormal; Notable for the following components:   Sodium 129 (*)    Potassium 3.2 (*)    Chloride 94 (*)    BUN 49 (*)    Creatinine, Ser 1.50 (*)    Glucose, Bld 142 (*)    Calcium, Ion 1.13 (*)    TCO2 17 (*)    Hemoglobin 11.6 (*)    HCT 34.0 (*)  All other components within normal limits  LIPASE, BLOOD  MAGNESIUM  PROTIME-INR  ACETAMINOPHEN LEVEL  ETHANOL  URINALYSIS, ROUTINE W REFLEX MICROSCOPIC  LACTIC ACID, PLASMA  LACTIC ACID, PLASMA  HEMOGLOBIN AND HEMATOCRIT, BLOOD  HEMOGLOBIN AND HEMATOCRIT, BLOOD  CBC  TYPE AND SCREEN  TROPONIN I (HIGH SENSITIVITY)  TROPONIN I (HIGH SENSITIVITY)    EKG None  Radiology CT ABDOMEN PELVIS WO CONTRAST  Result Date: 01/11/2023 CLINICAL DATA:  Abdominal pain. EXAM: CT ABDOMEN AND PELVIS WITHOUT CONTRAST TECHNIQUE: Multidetector CT imaging of the abdomen and pelvis was performed following the standard protocol without IV contrast. RADIATION DOSE REDUCTION: This exam was performed according to the departmental dose-optimization program which includes automated exposure control, adjustment of the mA and/or kV according to patient size and/or use of iterative reconstruction technique. COMPARISON:  CTA GI bleed 10/21/2022; CT abdomen pelvis 05/22/2021 FINDINGS: Lower chest: No acute abnormality. Subsegmental atelectasis versus scarring at the lung bases. Hepatobiliary: No focal liver abnormality is seen. No gallstones, gallbladder wall thickening, or biliary dilatation. Pancreas: Unremarkable. No pancreatic ductal dilatation or surrounding inflammatory changes. Spleen: Normal in size without focal abnormality. Adrenals/Urinary Tract: Adrenal glands are unremarkable. Kidneys are normal, without renal calculi, focal lesion, or hydronephrosis. Bladder is unremarkable. Stomach/Bowel: Stomach is within normal limits. Appendix is not definitely visualized, however there are no pericecal inflammatory changes. No evidence of bowel wall  thickening, distention, or inflammatory changes. Vascular/Lymphatic: Aortic atherosclerosis. No enlarged abdominal or pelvic lymph nodes. Reproductive: Status post hysterectomy. No adnexal masses. Other: Small fat containing umbilical hernia. No abdominopelvic ascites. Musculoskeletal: No acute or suspicious osseous findings. Chronic compression fracture of the L2 and L3 vertebral bodies. Severe degenerative disc disease throughout the lumbar spine. Stable grade 1 anterolisthesis of L4 on L5. IMPRESSION: 1. No acute abnormality in the abdomen or pelvis. 2. Stable chronic findings as noted in the body of the report. 3. Aortic atherosclerosis. Aortic Atherosclerosis (ICD10-I70.0). Electronically Signed   By: Sherron Ales M.D.   On: 01/11/2023 15:02    Procedures Procedures    Medications Ordered in ED Medications  pantoprozole (PROTONIX) 80 mg /NS 100 mL infusion (8 mg/hr Intravenous New Bag/Given 01/11/23 1507)  pantoprazole (PROTONIX) injection 40 mg (has no administration in time range)  metoCLOPramide (REGLAN) injection 10 mg (10 mg Intravenous Given 01/11/23 1417)  HYDROmorphone (DILAUDID) injection 0.5 mg (0.5 mg Intravenous Given 01/11/23 1418)  pantoprazole (PROTONIX) 80 mg /NS 100 mL IVPB (0 mg Intravenous Stopped 01/11/23 1454)  lactated ringers bolus 1,000 mL (0 mLs Intravenous Stopped 01/11/23 1529)  oxyCODONE-acetaminophen (PERCOCET/ROXICET) 5-325 MG per tablet 1 tablet (1 tablet Oral Given 01/11/23 1353)  potassium chloride (KLOR-CON) packet 40 mEq (40 mEq Oral Given 01/11/23 1710)  lactated ringers bolus 1,000 mL (1,000 mLs Intravenous New Bag/Given 01/11/23 1541)    ED Course/ Medical Decision Making/ A&P                             Medical Decision Making Amount and/or Complexity of Data Reviewed Labs: ordered. Radiology: ordered.  Risk Prescription drug management.   This patient presents to the ED for concern of abdominal pain and dark stools, this involves an extensive  number of treatment options, and is a complaint that carries with it a high risk of complications and morbidity.  The differential diagnosis includes PUD, pancreatitis, variceal bleeding, other GI bleeding, enteritis, colitis   Co morbidities that complicate the patient evaluation  arthritis, asthma, GERD, HLD, HTN,  alcohol induced pancreatitis, duodenal ulcer   Additional history obtained:  Additional history obtained from patient's significant other External records from outside source obtained and reviewed including EMR   Lab Tests:  I Ordered, and personally interpreted labs.  The pertinent results include: Initially normal hemoglobin with decreased on repeat; anion gap metabolic acidosis with elevated beta hydroxybutyrate consistent with alcoholic ketoacidosis; hypokalemia; AKI   Imaging Studies ordered:  I ordered imaging studies including CT of abdomen and pelvis I independently visualized and interpreted imaging which showed no acute findings I agree with the radiologist interpretation   Cardiac Monitoring: / EKG:  The patient was maintained on a cardiac monitor.  I personally viewed and interpreted the cardiac monitored which showed an underlying rhythm of: Sinus rhythm   Consultations Obtained:  I requested consultation with the [place] [facility] team, Marchelle Folks,  and discussed lab and imaging findings as well as pertinent plan - they recommend: EGD tomorrow   Problem List / ED Course / Critical interventions / Medication management  Patient presents for abdominal pain and dark stools.  Vital signs on arrival are notable for tachycardia.  On initial assessment, patient is sitting up in bed in efforts to minimize her abdominal pain.  She describes her abdominal pain is generalized.  It is currently 10/10 in severity.  She does not have any associated tenderness.  Although she has had vomiting since last night, she denies any nausea currently.  Dilaudid was ordered for analgesia.   Reglan was ordered for nausea.  Protonix was ordered for empiric treatment of UGIB.  Workup was initiated.  No drop in hemoglobin is noted on initial CBC.  CMP was notable for anion gap metabolic acidosis, AKI with elevated BUN, hypokalemia.  IV fluids and replace potassium were ordered.  On reassessment, patient has resolution of abdominal pain.  Rectal exam does confirm melena.  CT imaging did not show any acute findings.  While in the ED, patient has softening of blood pressures.  Additional IV fluids were ordered.  GI came and evaluated the patient in the ED.  They will plan on EGD tomorrow.  Patient's blood pressures responded to IV fluids.  She is now normotensive.  Repeat CBC does show drop in hemoglobin which is likely secondary to GI bleed in addition to hemodilution from IV fluids.  Patient remained pain-free.  She was admitted to medicine for further management. I ordered medication including IV fluids for hydration; Protonix for empiric treatment of UGIB; potassium chloride for hypokalemia; Dilaudid for analgesia; Reglan for nausea Reevaluation of the patient after these medicines showed that the patient improved I have reviewed the patients home medicines and have made adjustments as needed   Social Determinants of Health:  Ongoing alcohol abuse   CRITICAL CARE Performed by: Gloris Manchester   Total critical care time: 32 minutes  Critical care time was exclusive of separately billable procedures and treating other patients.  Critical care was necessary to treat or prevent imminent or life-threatening deterioration.  Critical care was time spent personally by me on the following activities: development of treatment plan with patient and/or surrogate as well as nursing, discussions with consultants, evaluation of patient's response to treatment, examination of patient, obtaining history from patient or surrogate, ordering and performing treatments and interventions, ordering and review of  laboratory studies, ordering and review of radiographic studies, pulse oximetry and re-evaluation of patient's condition.         Final Clinical Impression(s) / ED Diagnoses Final diagnoses:  Alcoholic ketoacidosis  Melena  AKI (acute kidney injury) [facility])    Rx / DC Orders ED Discharge Orders     None         Gloris Manchester, MD 01/11/23 1713

## 2023-01-11 NOTE — ED Notes (Signed)
ED TO INPATIENT HANDOFF REPORT  ED Nurse Name and Phone #: Vernona Rieger [phone]  S Name/Age/Gender Angel Hebert 67 y.o. female Room/Bed: 002C/002C  Code Status   Code Status: Full Code  Home/SNF/Other Home Patient oriented to: self, place, time, and situation Is this baseline? Yes   Triage Complete: Triage complete  Chief Complaint GI bleed [K92.2]  Triage Note Patient was here in feb for GI bleed.  Patient reports started having abd pain again and noticed blood in stool this am.  Patient drinks a few beers everyday per family.    Allergies No Known Allergies  Level of Care/Admitting Diagnosis ED Disposition     ED Disposition  Admit   Condition  --   Comment  Hospital Area: [facility] [[id number]]  Level of Care: Progressive [102]  Admit to Progressive based on following criteria: GI, ENDOCRINE disease patients with GI bleeding, acute liver failure or pancreatitis, stable with diabetic ketoacidosis or thyrotoxicosis (hypothyroid) state.  May admit patient to [facility] or [facility] if equivalent level of care is available:: No  Covid Evaluation: Asymptomatic - no recent exposure (last 10 days) testing not required  Diagnosis: GI bleed [960454]  Admitting Physician: Synetta Fail [[id number]]  Attending Physician: Synetta Fail [[id number]]  Certification:: I certify this patient will need inpatient services for at least 2 midnights  Estimated Length of Stay: 2          B Medical/Surgery History Past Medical History:  Diagnosis Date   Arthritis    Asthma    last year in December   Dyspnea    GERD (gastroesophageal reflux disease)    High cholesterol    Hypertension    Pneumonia 01/2017   Past Surgical History:  Procedure Laterality Date   ABDOMINAL HYSTERECTOMY     BIOPSY  10/22/2022   Procedure: BIOPSY;  Surgeon: Tressia Danas, MD;  Location: [facility] ENDOSCOPY;  Service: Gastroenterology;;   ESOPHAGOGASTRODUODENOSCOPY (EGD) WITH  PROPOFOL N/A 10/22/2022   Procedure: ESOPHAGOGASTRODUODENOSCOPY (EGD) WITH PROPOFOL;  Surgeon: Tressia Danas, MD;  Location: [facility] ENDOSCOPY;  Service: Gastroenterology;  Laterality: N/A;   FEMUR IM NAIL Left 04/17/2020   Procedure: INTRAMEDULLARY (IM) RETROGRADE FEMORAL NAILING;  Surgeon: Cammy Copa, MD;  Location: [facility] OR;  Service: Orthopedics;  Laterality: Left;   nasal polyps     removed just this past tuesday at Outpt facility over by [facility]   TOTAL KNEE ARTHROPLASTY Left 10/01/2016   Procedure: LEFT TOTAL KNEE ARTHROPLASTY;  Surgeon: Cammy Copa, MD;  Location: [facility] OR;  Service: Orthopedics;  Laterality: Left;     A IV Location/Drains/Wounds Patient Lines/Drains/Airways Status     Active Line/Drains/Airways     Name Placement date Placement time Site Days   Peripheral IV 01/11/23 20 G Anterior;Proximal;Right Forearm 01/11/23  1320  Forearm  less than 1            Intake/Output Last 24 hours  Intake/Output Summary (Last 24 hours) at 01/11/2023 1724 Last data filed at 01/11/2023 1724 Gross per 24 hour  Intake 2097.17 ml  Output --  Net 2097.17 ml    Labs/Imaging Results for orders placed or performed during the hospital encounter of 01/11/23 (from the past 48 hour(s))  Type and screen [facility]     Status: None   Collection Time: 01/11/23 10:40 AM  Result Value Ref Range   ABO/RH(D) B POS    Antibody Screen NEG    Sample Expiration  01/14/2023,2359 Performed at [facility], [address], [place], [place] [address]   Comprehensive metabolic panel     Status: Abnormal   Collection Time: 01/11/23 10:41 AM  Result Value Ref Range   Sodium 131 (L) 135 - 145 mmol/L   Potassium 3.0 (L) 3.5 - 5.1 mmol/L   Chloride 83 (L) 98 - 111 mmol/L   CO2 15 (L) 22 - 32 mmol/L   Glucose, Bld 115 (H) 70 - 99 mg/dL    Comment: Glucose reference range applies only to samples taken after fasting for at least 8 hours.   BUN 46 (H) 8 -  23 mg/dL   Creatinine, Ser 1.19 (H) 0.44 - 1.00 mg/dL   Calcium 9.0 8.9 - 14.7 mg/dL   Total Protein 7.5 6.5 - 8.1 g/dL   Albumin 3.5 3.5 - 5.0 g/dL   AST 29 15 - 41 U/L   ALT 13 0 - 44 U/L   Alkaline Phosphatase 87 38 - 126 U/L   Total Bilirubin 1.4 (H) 0.3 - 1.2 mg/dL   GFR, Estimated 34 (L) >60 mL/min    Comment: (NOTE) Calculated using the CKD-EPI Creatinine Equation (2021)    Anion gap 33 (H) 5 - 15    Comment: ELECTROLYTES REPEATED TO VERIFY Performed at [facility], [address] [place], [place] [address]   CBC     Status: Abnormal   Collection Time: 01/11/23 10:41 AM  Result Value Ref Range   WBC 8.7 4.0 - 10.5 K/uL   RBC 3.66 (L) 3.87 - 5.11 MIL/uL   Hemoglobin 12.1 12.0 - 15.0 g/dL   HCT 21.3 08.6 - 57.8 %   MCV 99.5 80.0 - 100.0 fL   MCH 33.1 26.0 - 34.0 pg   MCHC 33.2 30.0 - 36.0 g/dL   RDW 46.9 (H) 62.9 - 52.8 %   Platelets 387 150 - 400 K/uL   nRBC 0.0 0.0 - 0.2 %    Comment: Performed at [facility], [address], [place], [place] [address]  Lipase, blood     Status: None   Collection Time: 01/11/23  3:00 PM  Result Value Ref Range   Lipase 37 11 - 51 U/L    Comment: Performed at [facility], [address] [place], [place] [address]  Magnesium     Status: None   Collection Time: 01/11/23  3:00 PM  Result Value Ref Range   Magnesium 1.9 1.7 - 2.4 mg/dL    Comment: Performed at [facility], [address] [place], [place] [address]  Protime-INR     Status: None   Collection Time: 01/11/23  3:00 PM  Result Value Ref Range   Prothrombin Time 13.8 11.4 - 15.2 seconds   INR 1.0 0.8 - 1.2    Comment: (NOTE) INR goal varies based on device and disease states. Performed at [facility], [address], [place], [place] [address]   Beta-hydroxybutyric acid     Status: Abnormal   Collection Time: 01/11/23  3:00 PM  Result Value Ref Range   Beta-Hydroxybutyric Acid 7.70 (H) 0.05 - 0.27 mmol/L    Comment: RESULT  CONFIRMED BY MANUAL DILUTION Performed at [facility], [address], [place], [place] [address]   Salicylate level     Status: Abnormal   Collection Time: 01/11/23  3:00 PM  Result Value Ref Range   Salicylate Lvl <7.0 (L) 7.0 - 30.0 mg/dL  Comment: Performed at [facility], [address] [place], [place] [address]  Acetaminophen level     Status: None   Collection Time: 01/11/23  3:00 PM  Result Value Ref Range   Acetaminophen (Tylenol), Serum 11 10 - 30 ug/mL    Comment: (NOTE) Therapeutic concentrations vary significantly. A range of 10-30 ug/mL  may be an effective concentration for many patients. However, some  are best treated at concentrations outside of this range. Acetaminophen concentrations >150 ug/mL at 4 hours after ingestion  and >50 ug/mL at 12 hours after ingestion are often associated with  toxic reactions.  Performed at [facility], [address], [place], [place] [address]   Ethanol     Status: None   Collection Time: 01/11/23  3:00 PM  Result Value Ref Range   Alcohol, Ethyl (B) <10 <10 mg/dL    Comment: (NOTE) Lowest detectable limit for serum alcohol is 10 mg/dL.  For medical purposes only. Performed at [facility], [address] [place], [place] [address]   I-stat chem 8, ED (not at [facility], [facility] or [facility])     Status: Abnormal   Collection Time: 01/11/23  3:19 PM  Result Value Ref Range   Sodium 129 (L) 135 - 145 mmol/L   Potassium 3.2 (L) 3.5 - 5.1 mmol/L   Chloride 94 (L) 98 - 111 mmol/L   BUN 49 (H) 8 - 23 mg/dL   Creatinine, Ser 9.14 (H) 0.44 - 1.00 mg/dL   Glucose, Bld 782 (H) 70 - 99 mg/dL    Comment: Glucose reference range applies only to samples taken after fasting for at least 8 hours.   Calcium, Ion 1.13 (L) 1.15 - 1.40 mmol/L   TCO2 17 (L) 22 - 32 mmol/L   Hemoglobin 11.6 (L) 12.0 - 15.0 g/dL   HCT 95.6 (L) 21.3 - 08.6 %  POC occult blood, ED     Status: Abnormal   Collection Time: 01/11/23  3:24 PM   Result Value Ref Range   Fecal Occult Bld POSITIVE (A) NEGATIVE  CBC     Status: Abnormal   Collection Time: 01/11/23  3:43 PM  Result Value Ref Range   WBC 8.2 4.0 - 10.5 K/uL   RBC 2.63 (L) 3.87 - 5.11 MIL/uL   Hemoglobin 8.8 (L) 12.0 - 15.0 g/dL    Comment: REPEATED TO VERIFY   HCT 25.6 (L) 36.0 - 46.0 %   MCV 97.3 80.0 - 100.0 fL   MCH 33.5 26.0 - 34.0 pg   MCHC 34.4 30.0 - 36.0 g/dL   RDW 57.8 (H) 46.9 - 62.9 %   Platelets 304 150 - 400 K/uL    Comment: REPEATED TO VERIFY   nRBC 0.0 0.0 - 0.2 %    Comment: Performed at [facility], [address], [place], [place] [address]   CT ABDOMEN PELVIS WO CONTRAST  Result Date: 01/11/2023 CLINICAL DATA:  Abdominal pain. EXAM: CT ABDOMEN AND PELVIS WITHOUT CONTRAST TECHNIQUE: Multidetector CT imaging of the abdomen and pelvis was performed following the standard protocol without IV contrast. RADIATION DOSE REDUCTION: This exam was performed according to the departmental dose-optimization program which includes automated exposure control, adjustment of the mA and/or kV according to patient size and/or use of iterative reconstruction technique. COMPARISON:  CTA GI bleed 10/21/2022; CT abdomen pelvis 05/22/2021 FINDINGS: Lower chest: No acute abnormality. Subsegmental atelectasis versus scarring at the lung bases. Hepatobiliary: No focal liver abnormality is seen.  No gallstones, gallbladder wall thickening, or biliary dilatation. Pancreas: Unremarkable. No pancreatic ductal dilatation or surrounding inflammatory changes. Spleen: Normal in size without focal abnormality. Adrenals/Urinary Tract: Adrenal glands are unremarkable. Kidneys are normal, without renal calculi, focal lesion, or hydronephrosis. Bladder is unremarkable. Stomach/Bowel: Stomach is within normal limits. Appendix is not definitely visualized, however there are no pericecal inflammatory changes. No evidence of bowel wall thickening, distention, or inflammatory changes.  Vascular/Lymphatic: Aortic atherosclerosis. No enlarged abdominal or pelvic lymph nodes. Reproductive: Status post hysterectomy. No adnexal masses. Other: Small fat containing umbilical hernia. No abdominopelvic ascites. Musculoskeletal: No acute or suspicious osseous findings. Chronic compression fracture of the L2 and L3 vertebral bodies. Severe degenerative disc disease throughout the lumbar spine. Stable grade 1 anterolisthesis of L4 on L5. IMPRESSION: 1. No acute abnormality in the abdomen or pelvis. 2. Stable chronic findings as noted in the body of the report. 3. Aortic atherosclerosis. Aortic Atherosclerosis (ICD10-I70.0). Electronically Signed   By: Sherron Ales M.D.   On: 01/11/2023 15:02    Pending Labs Unresulted Labs (From admission, onward)     Start     Ordered   01/12/23 0500  CBC  Tomorrow morning,   R        01/11/23 1626   01/12/23 0500  Comprehensive metabolic panel  Tomorrow morning,   R        01/11/23 1715   01/12/23 0500  Beta-hydroxybutyric acid  Tomorrow morning,   R        01/11/23 1715   01/11/23 2000  Hemoglobin and hematocrit, blood  Now then every 6 hours,   R (with STAT occurrences)      01/11/23 1629   01/11/23 1715  Phosphorus  Add-on,   AD        01/11/23 1715   01/11/23 1224  Lactic acid, plasma  Now then every 2 hours,   R (with STAT occurrences)      01/11/23 1223   01/11/23 1128  Urinalysis, Routine w reflex microscopic -Urine, Clean Catch  Once,   URGENT       Question:  Specimen Source  Answer:  Urine, Clean Catch   01/11/23 1129            Vitals/Pain Today's Vitals   01/11/23 1528 01/11/23 1600 01/11/23 1605 01/11/23 1715  BP:  112/65  96/68  Pulse:  87  93  Resp:  15  17  Temp:   98.2 F (36.8 C)   TempSrc:   Oral   SpO2:  99%  100%  Weight:      Height:      PainSc: 4        Isolation Precautions No active isolations  Medications Medications  pantoprozole (PROTONIX) 80 mg /NS 100 mL infusion (8 mg/hr Intravenous New  Bag/Given 01/11/23 1507)  pantoprazole (PROTONIX) injection 40 mg (has no administration in time range)  metoCLOPramide (REGLAN) injection 10 mg (10 mg Intravenous Given 01/11/23 1417)  mometasone-formoterol (DULERA) 200-5 MCG/ACT inhaler 2 puff (has no administration in time range)  albuterol (PROVENTIL) (2.5 MG/3ML) 0.083% nebulizer solution 2.5 mg (has no administration in time range)  folic acid (FOLVITE) tablet 1 mg (has no administration in time range)  thiamine (VITAMIN B1) tablet 100 mg (has no administration in time range)  atorvastatin (LIPITOR) tablet 20 mg (has no administration in time range)  sodium chloride flush (NS) 0.9 % injection 3 mL (has no administration in time range)  acetaminophen (TYLENOL) tablet 650 mg (has no administration  in time range)    Or  acetaminophen (TYLENOL) suppository 650 mg (has no administration in time range)  thiamine (VITAMIN B1) 500 mg in sodium chloride 0.9 % 50 mL IVPB (has no administration in time range)  0.9 %  sodium chloride infusion (has no administration in time range)  LORazepam (ATIVAN) tablet 1-4 mg (has no administration in time range)    Or  LORazepam (ATIVAN) injection 1-4 mg (has no administration in time range)  multivitamin with minerals tablet 1 tablet (has no administration in time range)  HYDROmorphone (DILAUDID) injection 0.5 mg (0.5 mg Intravenous Given 01/11/23 1418)  pantoprazole (PROTONIX) 80 mg /NS 100 mL IVPB (0 mg Intravenous Stopped 01/11/23 1454)  lactated ringers bolus 1,000 mL (0 mLs Intravenous Stopped 01/11/23 1529)  oxyCODONE-acetaminophen (PERCOCET/ROXICET) 5-325 MG per tablet 1 tablet (1 tablet Oral Given 01/11/23 1353)  potassium chloride (KLOR-CON) packet 40 mEq (40 mEq Oral Given 01/11/23 1710)  lactated ringers bolus 1,000 mL (0 mLs Intravenous Stopped 01/11/23 1724)    Mobility walks     Focused Assessments Cardiac Assessment Handoff:    Lab Results  Component Value Date   CKTOTAL 48 07/25/2021    TROPONINI <0.03 10/08/2015   Lab Results  Component Value Date   DDIMER 9.69 (H) 07/13/2019   Does the Patient currently have chest pain? No    R Recommendations: See Admitting Provider Note  Report given to:   Additional Notes: BP was a little low and received 2L bolus. No hypotensive symptoms such as dizziness or syncope.

## 2023-01-11 NOTE — Consult Note (Addendum)
     Consultation  Referring Provider:   Dr. Dixon, ER Primary Care Physician:  Newlin, Enobong, MD Primary Gastroenterologist:  Dr. Nandigam       Reason for Consultation:     Melena         HPI:   Angel Hebert is a 67 y.o. female with past medical history significant for adenomatous colon polyps, diverticulosis, hypertension, hyperlipidemia, asthma, ETOH abuse, GERD, bilateral knee osteoarthritis  CHF (EF 45 to 50%), acute on chronic anemia with recent admission 10/22/2022 for duodenal ulcer negative H. pylori gastritis, presents to the ER with AKI.   GI consulted for ongoing abdominal pain and report of melenic stools. 10/21/2022 patient admitted with acute on chronic GI bleed, electrolyte imbalances with hyponatremia and hypokalemia and reported weight loss. CTA last admission negative for GI bleed, no notable celiac/CMA or IMA narrowing. 10/22/2022 EGD with Dr. Beavers normal esophagus, normal stomach, nonbleeding duodenal ulcer with no stigmata of bleeding.  This was to have repeat upper endoscopy in 8 weeks to monitor for healing.  Chronic gastritis negative H. pylori, thought secondary to EtOH use. 12/2015 colonoscopy showed 11 mm polyp cecum, 4 mm polyp transverse colon, 7 mm polyp sigmoid colon, diverticulosis, nonbleeding internal hemorrhoids pathology showed all tubular adenomatous polyps recall colonoscopy 3 years.  Labs in the ER showed AKI with BUN 49, creatinine 1.56 ( baseline bun 15 and Cr.71), sodium 129, potassium 3.2, glucose 142 Total bilirubin slightly elevated at 1.4, AST 29, ALT 13, alk phos 87, lipase 37, albumin 3.5.  INR unremarkable. Patient had intermittent anemia since 2017, with hemoglobin baseline being around 8-9.  10/25/2022 on discharge hemoglobin 8.7. Currently on admission hemoglobin 12.1, MCV 99.5, platelets 37, WBC 8.7.  I am assuming this is hemoconcentrated and will go down with IVF. Positive FOBT. Pending ethanol, acetaminophen level, salicylate  level, lactic acid. CT abdomen pelvis without contrast due to kidney function for abdominal pain shows no focal level abnormality, no gallstones, unremarkable pancreas, normal spleen, stomach is within normal limits.  No evidence of bowel wall thickening, distention or inflammatory changes.  Chronic compression fracture, aortic atherosclerosis.  Fiance John is here with her, give the majority of the history. Patient states she was in her normal state of health since discharge.   She did run out of her pantoprazole a month ago.   She has continued drinking at least one 40 ounce beer daily with liquor 3-4 times a week, has also been taking ibuprofen for bilateral leg pain 200 mg 2 pills twice daily. About 3 days ago began to have abdominal discomfort, begin to have loose stools without hematochezia.  About 2 daily.  No nocturnal symptoms. Then yesterday evening began having black watery stools, had 2 more episodes this morning, 1 in the bathroom.  No further bowel movements.. Patient is also started having nausea and vomiting this morning, potentially coffee-ground emesis in the vomit, states it was brown. She has had decreased appetite and poor p.o. intake, states she has had weight loss, difficult to quantify. Patient does deny any hematochezia, rectal pain. She has not had any associated dizziness, shortness of breath or chest pain.  Denies smoking or drug history. Denies family history of stomach or colon cancer. No history of antibiotic use recently, no sick contacts.  Abnormal ED labs: Abnormal Labs Reviewed  COMPREHENSIVE METABOLIC PANEL - Abnormal; Notable for the following components:      Result Value   Sodium 131 (*)    Potassium 3.0 (*)      Chloride 83 (*)    CO2 15 (*)    Glucose, Bld 115 (*)    BUN 46 (*)    Creatinine, Ser 1.65 (*)    Total Bilirubin 1.4 (*)    GFR, Estimated 34 (*)    Anion gap 33 (*)    All other components within normal limits  CBC - Abnormal; Notable  for the following components:   RBC 3.66 (*)    RDW 16.9 (*)    All other components within normal limits  POC OCCULT BLOOD, ED - Abnormal; Notable for the following components:   Fecal Occult Bld POSITIVE (*)    All other components within normal limits  I-STAT CHEM 8, ED - Abnormal; Notable for the following components:   Sodium 129 (*)    Potassium 3.2 (*)    Chloride 94 (*)    BUN 49 (*)    Creatinine, Ser 1.50 (*)    Glucose, Bld 142 (*)    Calcium, Ion 1.13 (*)    TCO2 17 (*)    Hemoglobin 11.6 (*)    HCT 34.0 (*)    All other components within normal limits     Past Medical History:  Diagnosis Date   Arthritis    Asthma    last year in December   Dyspnea    GERD (gastroesophageal reflux disease)    High cholesterol    Hypertension    Pneumonia 01/2017    Surgical History:  She  has a past surgical history that includes Abdominal hysterectomy; nasal polyps; Total knee arthroplasty (Left, 10/01/2016); Femur IM nail (Left, 04/17/2020); Esophagogastroduodenoscopy (egd) with propofol (N/A, 10/22/2022); and biopsy (10/22/2022). Family History:  Her family history includes Diabetes in her sister; Healthy in her father and mother. Social History:   reports that she has never smoked. She has never used smokeless tobacco. She reports current alcohol use of about 1.0 standard drink of alcohol per week. She reports that she does not use drugs.  Prior to Admission medications   Medication Sig Start Date End Date Taking? Authorizing Provider  albuterol (PROVENTIL) (2.5 MG/3ML) 0.083% nebulizer solution Take 3 mLs (2.5 mg total) by nebulization every 6 (six) hours as needed for wheezing or shortness of breath. Patient not taking: Reported on 10/21/2022 08/16/20   Newlin, Enobong, MD  albuterol (VENTOLIN HFA) 108 (90 Base) MCG/ACT inhaler TAKE 2 PUFFS BY MOUTH EVERY 6  HOURS AS NEEDED FOR WHEEZE OR  SHORTNESS OF BREATH 12/25/22   Newlin, Enobong, MD  atorvastatin (LIPITOR) 20 MG tablet  TAKE 1 TABLET BY MOUTH DAILY AT 6PM Patient taking differently: Take 20 mg by mouth daily. 08/07/22   Newlin, Enobong, MD  fluticasone-salmeterol (WIXELA INHUB) 500-50 MCG/ACT AEPB INHALE 1 PUFF INTO THE LUNGS IN THE MORNING AND AT BEDTIME. Patient taking differently: Inhale 1 puff into the lungs in the morning and at bedtime. 05/08/22   Newlin, Enobong, MD  folic acid (FOLVITE) 1 MG tablet Take 1 tablet (1 mg total) by mouth daily. 12/12/22   Newlin, Enobong, MD  HYDROcodone-acetaminophen (NORCO/VICODIN) 5-325 MG tablet Take 1 tablet by mouth every 6 (six) hours as needed for severe pain. 10/25/22   Elgergawy, Dawood S, MD  HYDROcodone-acetaminophen (NORCO/VICODIN) 5-325 MG tablet Take 1 tablets by mouth every 6 (six) hours as needed for severe pain. 10/25/22   Elgergawy, Dawood S, MD  losartan-hydrochlorothiazide (HYZAAR) 100-25 MG tablet Take 1 tablet by mouth daily. 12/12/22   Newlin, Enobong, MD  pantoprazole (PROTONIX) 40 MG tablet   Take 1 tablet (40 mg total) by mouth 2 (two) times daily. 12/12/22   Newlin, Enobong, MD  thiamine (VITAMIN B1) 100 MG tablet Take 1 tablet (100 mg total) by mouth daily. 12/12/22   Newlin, Enobong, MD  traZODone (DESYREL) 50 MG tablet Take 1 tablet (50 mg total) by mouth at bedtime as needed for sleep. 07/21/19 09/16/19  Alekh, Kshitiz, MD    Current Facility-Administered Medications  Medication Dose Route Frequency Provider Last Rate Last Admin   lactated ringers bolus 1,000 mL  1,000 mL Intravenous Once Dixon, Ryan, MD       metoCLOPramide (REGLAN) injection 10 mg  10 mg Intravenous Q6H PRN Dixon, Ryan, MD   10 mg at 01/11/23 1417   [START ON 01/14/2023] pantoprazole (PROTONIX) injection 40 mg  40 mg Intravenous Q12H Dixon, Ryan, MD       pantoprozole (PROTONIX) 80 mg /NS 100 mL infusion  8 mg/hr Intravenous Continuous Dixon, Ryan, MD 10 mL/hr at 01/11/23 1507 8 mg/hr at 01/11/23 1507   potassium chloride (KLOR-CON) packet 40 mEq  40 mEq Oral Once Dixon, Ryan, MD        Current Outpatient Medications  Medication Sig Dispense Refill   albuterol (PROVENTIL) (2.5 MG/3ML) 0.083% nebulizer solution Take 3 mLs (2.5 mg total) by nebulization every 6 (six) hours as needed for wheezing or shortness of breath. (Patient not taking: Reported on 10/21/2022) 75 mL 3   albuterol (VENTOLIN HFA) 108 (90 Base) MCG/ACT inhaler TAKE 2 PUFFS BY MOUTH EVERY 6  HOURS AS NEEDED FOR WHEEZE OR  SHORTNESS OF BREATH 34 g 2   atorvastatin (LIPITOR) 20 MG tablet TAKE 1 TABLET BY MOUTH DAILY AT 6PM (Patient taking differently: Take 20 mg by mouth daily.) 90 tablet 0   fluticasone-salmeterol (WIXELA INHUB) 500-50 MCG/ACT AEPB INHALE 1 PUFF INTO THE LUNGS IN THE MORNING AND AT BEDTIME. (Patient taking differently: Inhale 1 puff into the lungs in the morning and at bedtime.) 60 each 1   folic acid (FOLVITE) 1 MG tablet Take 1 tablet (1 mg total) by mouth daily. 30 tablet 0   HYDROcodone-acetaminophen (NORCO/VICODIN) 5-325 MG tablet Take 1 tablet by mouth every 6 (six) hours as needed for severe pain. 10 tablet 0   HYDROcodone-acetaminophen (NORCO/VICODIN) 5-325 MG tablet Take 1 tablets by mouth every 6 (six) hours as needed for severe pain. 10 tablet 0   losartan-hydrochlorothiazide (HYZAAR) 100-25 MG tablet Take 1 tablet by mouth daily. 30 tablet 0   pantoprazole (PROTONIX) 40 MG tablet Take 1 tablet (40 mg total) by mouth 2 (two) times daily. 60 tablet 0   thiamine (VITAMIN B1) 100 MG tablet Take 1 tablet (100 mg total) by mouth daily. 30 tablet 0    Allergies as of 01/11/2023   (No Known Allergies)    Review of Systems:    Review of Systems  Constitutional:  Positive for weight loss. Negative for chills, fever and malaise/fatigue.  Respiratory:  Negative for cough and shortness of breath.   Cardiovascular:  Negative for chest pain and leg swelling.  Gastrointestinal:  Positive for abdominal pain, diarrhea, heartburn, melena, nausea and vomiting. Negative for blood in stool and  constipation.  Musculoskeletal:  Positive for myalgias.  Neurological:  Positive for dizziness. Negative for focal weakness.       Physical Exam:  Vital signs in last 24 hours: Temp:  [97.7 F (36.5 C)-98.6 F (37 C)] 98.6 F (37 C) (05/11 1441) Pulse Rate:  [91-111] 91 (05/11 1515) Resp:  [15-20]   15 (05/11 1515) BP: (74-112)/(50-77) 77/54 (05/11 1515) SpO2:  [94 %-100 %] 99 % (05/11 1515) Weight:  [68 kg] 68 kg (05/11 1028)   Last BM recorded by nurses in past 5 days No data recorded  General: Thin, chronically ill-appearing AA female Head:  Normocephalic and atraumatic.  Poor dentition Eyes: sclerae anicteric,conjunctive pink  Heart:  regular rate and rhythm, no murmurs rubs or gallops but loud S2 Pulm: Clear anteriorly; no wheezing Abdomen:  Soft, Obese AB, Active bowel sounds. mild tenderness in the epigastrium. Without guarding and Without rebound, No organomegaly appreciated. Extremities:  Without edema. Msk:  Symmetrical without gross deformities. Peripheral pulses intact.  Neurologic:  Alert and  oriented x4;  No focal deficits.  Skin:   Dry and intact without significant lesions or rashes. Psychiatric:  Cooperative. Normal mood and affect.  LAB RESULTS: Recent Labs    01/11/23 1041 01/11/23 1519  WBC 8.7  --   HGB 12.1 11.6*  HCT 36.4 34.0*  PLT 387  --    BMET Recent Labs    01/11/23 1041 01/11/23 1519  NA 131* 129*  K 3.0* 3.2*  CL 83* 94*  CO2 15*  --   GLUCOSE 115* 142*  BUN 46* 49*  CREATININE 1.65* 1.50*  CALCIUM 9.0  --    LFT Recent Labs    01/11/23 1041  PROT 7.5  ALBUMIN 3.5  AST 29  ALT 13  ALKPHOS 87  BILITOT 1.4*   PT/INR Recent Labs    01/11/23 1500  LABPROT 13.8  INR 1.0    STUDIES: CT ABDOMEN PELVIS WO CONTRAST  Result Date: 01/11/2023 CLINICAL DATA:  Abdominal pain. EXAM: CT ABDOMEN AND PELVIS WITHOUT CONTRAST TECHNIQUE: Multidetector CT imaging of the abdomen and pelvis was performed following the standard  protocol without IV contrast. RADIATION DOSE REDUCTION: This exam was performed according to the departmental dose-optimization program which includes automated exposure control, adjustment of the mA and/or kV according to patient size and/or use of iterative reconstruction technique. COMPARISON:  CTA GI bleed 10/21/2022; CT abdomen pelvis 05/22/2021 FINDINGS: Lower chest: No acute abnormality. Subsegmental atelectasis versus scarring at the lung bases. Hepatobiliary: No focal liver abnormality is seen. No gallstones, gallbladder wall thickening, or biliary dilatation. Pancreas: Unremarkable. No pancreatic ductal dilatation or surrounding inflammatory changes. Spleen: Normal in size without focal abnormality. Adrenals/Urinary Tract: Adrenal glands are unremarkable. Kidneys are normal, without renal calculi, focal lesion, or hydronephrosis. Bladder is unremarkable. Stomach/Bowel: Stomach is within normal limits. Appendix is not definitely visualized, however there are no pericecal inflammatory changes. No evidence of bowel wall thickening, distention, or inflammatory changes. Vascular/Lymphatic: Aortic atherosclerosis. No enlarged abdominal or pelvic lymph nodes. Reproductive: Status post hysterectomy. No adnexal masses. Other: Small fat containing umbilical hernia. No abdominopelvic ascites. Musculoskeletal: No acute or suspicious osseous findings. Chronic compression fracture of the L2 and L3 vertebral bodies. Severe degenerative disc disease throughout the lumbar spine. Stable grade 1 anterolisthesis of L4 on L5. IMPRESSION: 1. No acute abnormality in the abdomen or pelvis. 2. Stable chronic findings as noted in the body of the report. 3. Aortic atherosclerosis. Aortic Atherosclerosis (ICD10-I70.0). Electronically Signed   By: Laura  Parra M.D.   On: 01/11/2023 15:02      Impression    Melena with abdominal pain duodenal ulcer 10/2022, negative H. pylori, thought to be due to EtOH 2017 last colonoscopy 3  adenomatous polyps recall 3 years Hemoglobin likely hemoconcentrated 12.8, will anticipate decreasing after IVF.  Baseline appears to be closer to   9. Patient supposed to have follow-up EGD 8 weeks, has not had follow-up with her office.  Has ran out of pantoprazole in the last month, has continued alcohol use daily, ibuprofen 4 pills daily.  AKI with hyponatremia, hypokalemia Likely secondary to poor p.o. intake and GI losses No history of antibiotic use, no history of sick contacts Continues to have diarrhea via can consider testing stool for C. difficile/GI pathogen panel but I believe this is more due to alcohol use.  Alcohol abuse No evidence of cirrhosis on CT, normal spleen, normal platelets  CHF EF 45 to 50% in 2020, grade 1 diastolic dysfunction mild MR No shortness of breath, no leg swelling.  History of adenomatous polyps Last colonoscopy 2017, recall 2020.  Had 3 adenomatous polyps No hematochezia CT abdomen pelvis last visit this visit without bowel wall thickening or evidence of lymphadenopathy.   LOS: 0 days     Plan   -Continue supportive care with IVF -Protonix infusion with drip -Clear liquid diet, NPO at midnight. -Expecting her hemoglobin to drop with IVF. Recheck H/H now and in 6 hours, CBC in the morning  -Continue to monitor H&H with transfusion as needed to maintain hemoglobin greater than 7. -Therapeutic EGD tomorrow to evaluate for bleeding peptic ulcer disease in setting of NSAIDs and EtOH.  I thoroughly discussed the procedure to include nature, alternatives, benefits, and risks including but not limited to bleeding, perforation, infection, anesthesia/cardiac and pulmonary complications. Patient provides understanding and gave verbal consent to proceed. --Empiric treatment with folic acid, multivitamin and thiamine.  -Alcohol Abstinence counseling discussed with patient -Monitor for withdrawal symptoms while inpatient -At this time I do not believe the  patient would be able to drink the prep, will need colonoscopy for further evaluation as patient is overdue, weight loss, abdominal pain.  Uncertain patient's reliable enough for outpatient colonoscopy, consider inpatient if patient is willing.  Thank you for your kind consultation, we will continue to follow.   Amanda R Collier  01/11/2023, 3:34 PM    Attending Physician Note   I have taken a history, reviewed the chart and examined the patient. I performed a substantive portion of this encounter, including complete performance of at least one of the key components, in conjunction with the APP. I agree with the APP's note, impression and recommendations with my edits. My additional impressions and recommendations are as follows.   Melena, periumbilical abdominal pain, N/V (without hematemesis). History of a DU, H pylori negative, in Feb 2024. Highly likely recurrent ulcer. R/O MW tear, AVM, other causes. IV pantoprazole bolus and infusion. Patient counseled to discontinue NSAIDs and Etoh. EGD tomorrow.   ABL on chronic anemia. Anticipate further Hgb drop with hydration. Trend CBC. Transfusions to maintain Hgb > 7.   History of adenomatous colon polyps, overdue for surveillance. Colonoscopy after acute problem has stabilized either as inpatient or outpatient.   Etoh abuse. Discontinue Etoh.  AKI, hyponatremia, hypokalemia  - suspect dehydration. IVFs and per primary service.  Taisa Deloria, MD FACG See [facility], [facility], for our on call provider   

## 2023-01-12 ENCOUNTER — Inpatient Hospital Stay (HOSPITAL_COMMUNITY): Payer: 59 | Admitting: Anesthesiology

## 2023-01-12 ENCOUNTER — Encounter (HOSPITAL_COMMUNITY): Payer: Self-pay | Admitting: Internal Medicine

## 2023-01-12 ENCOUNTER — Encounter (HOSPITAL_COMMUNITY)
Admission: EM | Disposition: A | Discharge status: Home / Self Care | Payer: Self-pay | Source: Home / Self Care | Attending: Internal Medicine

## 2023-01-12 DIAGNOSIS — K2950 Unspecified chronic gastritis without bleeding: Secondary | ICD-10-CM

## 2023-01-12 DIAGNOSIS — D508 Other iron deficiency anemias: Secondary | ICD-10-CM

## 2023-01-12 DIAGNOSIS — I10 Essential (primary) hypertension: Secondary | ICD-10-CM | POA: Diagnosis not present

## 2023-01-12 DIAGNOSIS — K921 Melena: Secondary | ICD-10-CM

## 2023-01-12 DIAGNOSIS — N179 Acute kidney failure, unspecified: Secondary | ICD-10-CM

## 2023-01-12 DIAGNOSIS — J45909 Unspecified asthma, uncomplicated: Secondary | ICD-10-CM

## 2023-01-12 DIAGNOSIS — K264 Chronic or unspecified duodenal ulcer with hemorrhage: Secondary | ICD-10-CM

## 2023-01-12 DIAGNOSIS — K449 Diaphragmatic hernia without obstruction or gangrene: Secondary | ICD-10-CM

## 2023-01-12 DIAGNOSIS — K209 Esophagitis, unspecified without bleeding: Secondary | ICD-10-CM

## 2023-01-12 HISTORY — PX: ESOPHAGOGASTRODUODENOSCOPY (EGD) WITH PROPOFOL: SHX5813

## 2023-01-12 HISTORY — PX: BIOPSY: SHX5522

## 2023-01-12 LAB — COMPREHENSIVE METABOLIC PANEL
ALT: 11 U/L (ref 0–44)
AST: 22 U/L (ref 15–41)
Albumin: 2.8 g/dL — ABNORMAL LOW (ref 3.5–5.0)
Alkaline Phosphatase: 58 U/L (ref 38–126)
Anion gap: 10 (ref 5–15)
BUN: 35 mg/dL — ABNORMAL HIGH (ref 8–23)
CO2: 24 mmol/L (ref 22–32)
Calcium: 8.5 mg/dL — ABNORMAL LOW (ref 8.9–10.3)
Chloride: 97 mmol/L — ABNORMAL LOW (ref 98–111)
Creatinine, Ser: 1.03 mg/dL — ABNORMAL HIGH (ref 0.44–1.00)
GFR, Estimated: 60 mL/min — ABNORMAL LOW (ref >60)
Glucose, Bld: 95 mg/dL (ref 70–99)
Potassium: 4.2 mmol/L (ref 3.5–5.1)
Sodium: 131 mmol/L — ABNORMAL LOW (ref 135–145)
Total Bilirubin: 1 mg/dL (ref 0.3–1.2)
Total Protein: 5.5 g/dL — ABNORMAL LOW (ref 6.5–8.1)

## 2023-01-12 LAB — CBC
HCT: 23.6 % — ABNORMAL LOW (ref 36.0–46.0)
HCT: 23.7 % — ABNORMAL LOW (ref 36.0–46.0)
Hemoglobin: 8.3 g/dL — ABNORMAL LOW (ref 12.0–15.0)
Hemoglobin: 8.1 g/dL — ABNORMAL LOW (ref 12.0–15.0)
MCH: 33.7 pg (ref 26.0–34.0)
MCH: 33.5 pg (ref 26.0–34.0)
MCHC: 35.2 g/dL (ref 30.0–36.0)
MCHC: 34.2 g/dL (ref 30.0–36.0)
MCV: 95.9 fL (ref 80.0–100.0)
MCV: 97.9 fL (ref 80.0–100.0)
Platelets: 260 10*3/uL (ref 150–400)
Platelets: 261 10*3/uL (ref 150–400)
RBC: 2.46 MIL/uL — ABNORMAL LOW (ref 3.87–5.11)
RBC: 2.42 MIL/uL — ABNORMAL LOW (ref 3.87–5.11)
RDW: 17.1 % — ABNORMAL HIGH (ref 11.5–15.5)
RDW: 17.4 % — ABNORMAL HIGH (ref 11.5–15.5)
WBC: 6.8 10*3/uL (ref 4.0–10.5)
WBC: 6.6 10*3/uL (ref 4.0–10.5)
nRBC: 0 % (ref 0.0–0.2)
nRBC: 0 % (ref 0.0–0.2)

## 2023-01-12 LAB — BETA-HYDROXYBUTYRIC ACID: Beta-Hydroxybutyric Acid: 1.87 mmol/L — ABNORMAL HIGH (ref 0.05–0.27)

## 2023-01-12 SURGERY — ESOPHAGOGASTRODUODENOSCOPY (EGD) WITH PROPOFOL
Anesthesia: Monitor Anesthesia Care

## 2023-01-12 MED ORDER — PROPOFOL 500 MG/50ML IV EMUL
INTRAVENOUS | Status: DC | PRN
  Administered 2023-01-12: 180 ug/kg/min via INTRAVENOUS

## 2023-01-12 MED ORDER — PANTOPRAZOLE SODIUM 40 MG PO TBEC
40.0000 mg | DELAYED_RELEASE_TABLET | Freq: Two times a day (BID) | ORAL | Status: DC
  Administered 2023-01-12 – 2023-01-13 (×2): 40 mg via ORAL
  Filled 2023-01-12 (×2): qty 1

## 2023-01-12 MED ORDER — PHENYLEPHRINE 80 MCG/ML (10ML) SYRINGE FOR IV PUSH (FOR BLOOD PRESSURE SUPPORT)
PREFILLED_SYRINGE | INTRAVENOUS | Status: DC | PRN
  Administered 2023-01-12 (×2): 80 ug via INTRAVENOUS

## 2023-01-12 MED ORDER — PROPOFOL 10 MG/ML IV BOLUS
INTRAVENOUS | Status: DC | PRN
  Administered 2023-01-12 (×4): 20 mg via INTRAVENOUS

## 2023-01-12 MED ORDER — LACTATED RINGERS IV SOLN: INTRAVENOUS | Status: DC | PRN

## 2023-01-12 MED ORDER — LIDOCAINE 2% (20 MG/ML) 5 ML SYRINGE
INTRAMUSCULAR | Status: DC | PRN
  Administered 2023-01-12: 40 mg via INTRAVENOUS

## 2023-01-12 MED ORDER — SODIUM CHLORIDE 0.9 % IV SOLN: INTRAVENOUS | Status: DC

## 2023-01-12 SURGICAL SUPPLY — 15 items

## 2023-01-12 NOTE — Transfer of Care (Signed)
Immediate Anesthesia Transfer of Care Note  Patient: Angel Hebert  Procedure(s) Performed: ESOPHAGOGASTRODUODENOSCOPY (EGD) WITH PROPOFOL BIOPSY  Patient Location: PACU  Anesthesia Type:MAC  Level of Consciousness: awake, alert , and oriented  Airway & Oxygen Therapy: Patient Spontanous Breathing  Post-op Assessment: Report given to RN and Post -op Vital signs reviewed and stable  Post vital signs: Reviewed and stable  Last Vitals:  Vitals Value Taken Time  BP 105/65 01/12/23 1015  Temp    Pulse 89 01/12/23 1017  Resp 26 01/12/23 1017  SpO2 100 % 01/12/23 1017  Vitals shown include unvalidated device data.  Last Pain:  Vitals:   01/12/23 0940  TempSrc: Temporal  PainSc: 0-No pain      Patients Stated Pain Goal: 0 (01/11/23 2000)  Complications: No notable events documented.

## 2023-01-12 NOTE — Interval H&P Note (Signed)
History and Physical Interval Note: For EGD today to evaluate melena, abdominal pain with nausea and vomiting.  Personal history of peptic ulcer disease/duodenal ulcer The nature of the procedure, as well as the risks, benefits, and alternatives were carefully and thoroughly reviewed with the patient. Ample time for discussion and questions allowed. The patient understood, was satisfied, and agreed to proceed.      Latest Ref Rng & Units 01/12/2023    3:15 AM 01/11/2023    4:00 PM 01/11/2023    3:43 PM  CBC  WBC 4.0 - 10.5 K/uL 6.8   8.2   Hemoglobin 12.0 - 15.0 g/dL 8.3  9.1  8.8   Hematocrit 36.0 - 46.0 % 23.6  26.7  25.6   Platelets 150 - 400 K/uL 260   304      01/12/2023 9:51 AM  [name]  has presented today for surgery, with the diagnosis of melena, anemia.  The various methods of treatment have been discussed with the patient and family. After consideration of risks, benefits and other options for treatment, the patient has consented to  Procedure(s): ESOPHAGOGASTRODUODENOSCOPY (EGD) WITH PROPOFOL (N/A) as a surgical intervention.  The patient's history has been reviewed, patient examined, no change in status, stable for surgery.  I have reviewed the patient's chart and labs.  Questions were answered to the patient's satisfaction.     Angel Hebert

## 2023-01-12 NOTE — Op Note (Signed)
[facility] Patient Name: Angel Hebert Procedure Date : 01/12/2023 MRN: [id number] Attending MD: Beverley Fiedler , MD, [id number] Date of Birth: May 22, 1956 CSN: [id number] Age: 67 Admit Type: Inpatient Procedure:                Upper GI endoscopy Indications:              Epigastric abdominal pain, Melena, ongoing NSAID                            and ETOH use/abuse Providers:                Carie Caddy. Rhea Belton, MD, Adolph Pollack, RN, Rozetta Nunnery, Technician Referring MD:             [facility] Medicines:                Monitored Anesthesia Care Complications:            No immediate complications. Estimated Blood Loss:     Estimated blood loss: none. Procedure:                Pre-Anesthesia Assessment:                           - Prior to the procedure, a History and Physical                            was performed, and patient medications and                            allergies were reviewed. The patient's tolerance of                            previous anesthesia was also reviewed. The risks                            and benefits of the procedure and the sedation                            options and risks were discussed with the patient.                            All questions were answered, and informed consent                            was obtained. Prior Anticoagulants: The patient has                            taken no anticoagulant or antiplatelet agents. ASA                            Grade Assessment: III - A patient with severe  systemic disease. After reviewing the risks and                            benefits, the patient was deemed in satisfactory                            condition to undergo the procedure.                           After obtaining informed consent, the endoscope was                            passed under direct vision. Throughout the                             procedure, the patient's blood pressure, pulse, and                            oxygen saturations were monitored continuously. The                            [id number]-H190 ([id number] Olympus endoscope was introduced                            through the mouth, and advanced to the second part                            of duodenum. The upper GI endoscopy was                            accomplished without difficulty. The patient                            tolerated the procedure well. Scope In: Scope Out: Findings:      LA Grade B (one or more mucosal breaks greater than 5 mm, not extending       between the tops of two mucosal folds) esophagitis with no bleeding was       found at the gastroesophageal junction.      A 2 cm hiatal hernia was present.      Mild inflammation characterized by congestion (edema) and erythema was       found in the gastric antrum. Biopsies were taken with a cold forceps for       histology and Helicobacter pylori testing.      Two non-bleeding cratered duodenal ulcers, one with scant dark pigmented       material were found in the duodenal bulb. The largest lesion was 12 mm       in largest dimension. Impression:               - LA Grade B esophagitis with no bleeding.                           - 2 cm hiatal hernia.                           -  Gastritis. Biopsied, to exclude H. Pylori.                           - Non-bleeding duodenal ulcers. Moderate Sedation:      N/A Recommendation:           - Return patient to hospital ward for ongoing care.                           - Advance diet as tolerated.                           - Continue present medications. I stressed the                            importance on pantoprazole 40 mg BID and no NSAIDs                            or alcohol.                           - No aspirin, ibuprofen, naproxen, or other                            non-steroidal anti-inflammatory drugs.                           - Await  pathology results, though ulcers are likely                            NSAID and absence of PPI.                           - GI followup with Dr. Lavon Paganini is recommended                            including discussion about repeat colonoscopy for                            adenoma surveillance (which remains overdue).                           - GI will sign off, call if questions. Procedure Code(s):        --- Professional ---                           [id number], Esophagogastroduodenoscopy, flexible,                            transoral; with biopsy, single or multiple Diagnosis Code(s):        --- Professional ---                           K20.90, Esophagitis, unspecified without bleeding                           K44.9,  Diaphragmatic hernia without obstruction or                            gangrene                           K29.70, Gastritis, unspecified, without bleeding                           K26.9, Duodenal ulcer, unspecified as acute or                            chronic, without hemorrhage or perforation                           R10.13, Epigastric pain                           K92.1, Melena (includes Hematochezia) CPT copyright 2022 [employer]. All rights reserved. The codes documented in this report are preliminary and upon coder review may  be revised to meet current compliance requirements. Beverley Fiedler, MD 01/12/2023 10:17:00 AM This report has been signed electronically. Number of Addenda: 0

## 2023-01-12 NOTE — Anesthesia Preprocedure Evaluation (Addendum)
Anesthesia Evaluation  Patient identified by MRN, date of birth, ID band Patient awake    Reviewed: Allergy & Precautions, NPO status , Patient's Chart, lab work & pertinent test results  Airway Mallampati: II  TM Distance: >3 FB Neck ROM: Full    Dental  (+) Dental Advisory Given, Poor Dentition, Missing   Pulmonary asthma    Pulmonary exam normal breath sounds clear to auscultation       Cardiovascular hypertension, Pt. on medications Normal cardiovascular exam Rhythm:Regular Rate:Normal     Neuro/Psych negative neurological ROS     GI/Hepatic PUD,GERD  Medicated,,(+)     substance abuse  alcohol use  Endo/Other  diabetes (AKI)    Renal/GU Renal disease     Musculoskeletal  (+) Arthritis ,    Abdominal   Peds  Hematology  (+) Blood dyscrasia, anemia   Anesthesia Other Findings Day of surgery medications reviewed with the patient.  Reproductive/Obstetrics                             Anesthesia Physical Anesthesia Plan  ASA: 3  Anesthesia Plan: MAC   Post-op Pain Management: Minimal or no pain anticipated   Induction: Intravenous  PONV Risk Score and Plan: 2 and TIVA and Treatment may vary due to age or medical condition  Airway Management Planned: Natural Airway and Simple Face Mask  Additional Equipment:   Intra-op Plan:   Post-operative Plan:   Informed Consent: I have reviewed the patients History and Physical, chart, labs and discussed the procedure including the risks, benefits and alternatives for the proposed anesthesia with the patient or authorized representative who has indicated his/her understanding and acceptance.     Dental advisory given  Plan Discussed with: CRNA and Anesthesiologist  Anesthesia Plan Comments:        Anesthesia Quick Evaluation

## 2023-01-12 NOTE — Progress Notes (Signed)
PROGRESS NOTE        PATIENT DETAILS Name: Angel Hebert Age: 67 y.o. Sex: female Date of Birth: [birth year] Admit Date: 01/11/2023 Admitting Physician Synetta Fail, MD SEG:BTDVVO, [name], MD  Brief Summary: Patient is a 67 y.o.  female prior history of upper GI bleed due to duodenal ulcer-EtOH use-who presented with upper GI bleeding with acute blood loss anemia and abdominal pain.   Significant events: 5/11>> admit to [facility]  Significant studies: 5/11>> CT abdomen/pelvis: No acute abnormality.  Significant microbiology data: None  Procedures: None  Consults: GI  Subjective: Lying comfortably in bed-denies any chest pain or shortness of breath.  No abdominal pain.  No melena since yesterday.  Objective: Vitals: Blood pressure 108/61, pulse 83, temperature 98 F (36.7 C), temperature source Temporal, resp. rate 15, height 5\' 6"  (1.676 m), weight 68 kg, SpO2 100 %.   Exam: Gen Exam:Alert awake-not in any distress HEENT:atraumatic, normocephalic Chest: B/L clear to auscultation anteriorly CVS:S1S2 regular Abdomen:soft non tender, non distended Extremities:no edema Neurology: Non focal Skin: no rash  Pertinent Labs/Radiology:    Latest Ref Rng & Units 01/12/2023    3:15 AM 01/11/2023    4:00 PM 01/11/2023    3:43 PM  CBC  WBC 4.0 - 10.5 K/uL 6.8   8.2   Hemoglobin 12.0 - 15.0 g/dL 8.3  9.1  8.8   Hematocrit 36.0 - 46.0 % 23.6  26.7  25.6   Platelets 150 - 400 K/uL 260   304     Lab Results  Component Value Date   NA 131 (L) 01/12/2023   K 4.2 01/12/2023   CL 97 (L) 01/12/2023   CO2 24 01/12/2023      Assessment/Plan: Upper GI bleeding with acute blood loss anemia No further melena overnight Hb relatively stable Continue PPI EGD later today Follow Hb-and transfuse if significant drop  Upper abdominal pain Likely due to EtOH related gastritis/PUD Lipase levels normal-no significant abnormality seen on CT  chest Thankfully abdominal pain is much better with supportive care  AKI Hemodynamically mediated Improved  Hyponatremia/hypokalemia Due to GI bleed/EtOH use Improved Follow electrolytes periodically  HLD Statin  HTN BP stable-losartan/HCTZ on hold  Bronchial asthma Not in flare. Continue bronchodilators  BMI: Estimated body mass index is 24.21 kg/m as calculated from the following:   Height as of this encounter: 5\' 6"  (1.676 m).   Weight as of this encounter: 68 kg.   Code status:   Code Status: Full Code   DVT Prophylaxis: SCDs Start: 01/11/23 1708   Family Communication: Spouse at bedside   Disposition Plan: Status is: Inpatient Remains inpatient appropriate because: Severity of illness   Planned Discharge Destination:Home   Diet: Diet Order             Diet NPO time specified  Diet effective now                     Antimicrobial agents: Anti-infectives (From admission, onward)    None        MEDICATIONS: Scheduled Meds:  [MAR Hold] atorvastatin  20 mg Oral Daily   [MAR Hold] folic acid  1 mg Oral Daily   [MAR Hold] mometasone-formoterol  2 puff Inhalation BID   [MAR Hold] multivitamin with minerals  1 tablet Oral Daily   [MAR Hold] pantoprazole  40 mg Intravenous  Q12H   [MAR Hold] sodium chloride flush  3 mL Intravenous Q12H   [MAR Hold] thiamine  100 mg Oral Daily   Continuous Infusions:  sodium chloride 20 mL/hr at 01/12/23 0431   sodium chloride Stopped (01/12/23 0431)   pantoprazole 8 mg/hr (01/12/23 0520)   PRN Meds:.[MAR Hold] acetaminophen **OR** [MAR Hold] acetaminophen, [MAR Hold] albuterol, [MAR Hold] LORazepam **OR** [MAR Hold] LORazepam, [MAR Hold] metoCLOPramide (REGLAN) injection, [MAR Hold] oxyCODONE-acetaminophen   I have personally reviewed following labs and imaging studies  LABORATORY DATA: CBC: Recent Labs  Lab 01/11/23 1041 01/11/23 1519 01/11/23 1543 01/11/23 1600 01/12/23 0315  WBC 8.7  --  8.2   --  6.8  HGB 12.1 11.6* 8.8* 9.1* 8.3*  HCT 36.4 34.0* 25.6* 26.7* 23.6*  MCV 99.5  --  97.3  --  95.9  PLT 387  --  304  --  260    Basic Metabolic Panel: Recent Labs  Lab 01/11/23 1041 01/11/23 1500 01/11/23 1519 01/11/23 1600 01/12/23 0315  NA 131*  --  129*  --  131*  K 3.0*  --  3.2*  --  4.2  CL 83*  --  94*  --  97*  CO2 15*  --   --   --  24  GLUCOSE 115*  --  142*  --  95  BUN 46*  --  49*  --  35*  CREATININE 1.65*  --  1.50*  --  1.03*  CALCIUM 9.0  --   --   --  8.5*  MG  --  1.9  --   --   --   PHOS  --   --   --  4.1  --     GFR: Estimated Creatinine Clearance: 50.3 mL/min (A) (by C-G formula based on SCr of 1.03 mg/dL (H)).  Liver Function Tests: Recent Labs  Lab 01/11/23 1041 01/12/23 0315  AST 29 22  ALT 13 11  ALKPHOS 87 58  BILITOT 1.4* 1.0  PROT 7.5 5.5*  ALBUMIN 3.5 2.8*   Recent Labs  Lab 01/11/23 1500  LIPASE 37   No results for input(s): "AMMONIA" in the last 168 hours.  Coagulation Profile: Recent Labs  Lab 01/11/23 1500  INR 1.0    Cardiac Enzymes: No results for input(s): "CKTOTAL", "CKMB", "CKMBINDEX", "TROPONINI" in the last 168 hours.  BNP (last 3 results) No results for input(s): "PROBNP" in the last 8760 hours.  Lipid Profile: No results for input(s): "CHOL", "HDL", "LDLCALC", "TRIG", "CHOLHDL", "LDLDIRECT" in the last 72 hours.  Thyroid Function Tests: No results for input(s): "TSH", "T4TOTAL", "FREET4", "T3FREE", "THYROIDAB" in the last 72 hours.  Anemia Panel: No results for input(s): "VITAMINB12", "FOLATE", "FERRITIN", "TIBC", "IRON", "RETICCTPCT" in the last 72 hours.  Urine analysis:    Component Value Date/Time   COLORURINE YELLOW 01/11/2023 1128   APPEARANCEUR CLEAR 01/11/2023 1128   LABSPEC 1.013 01/11/2023 1128   PHURINE 5.0 01/11/2023 1128   GLUCOSEU NEGATIVE 01/11/2023 1128   HGBUR SMALL (A) 01/11/2023 1128   BILIRUBINUR NEGATIVE 01/11/2023 1128   KETONESUR 80 (A) 01/11/2023 1128   PROTEINUR  NEGATIVE 01/11/2023 1128   UROBILINOGEN 0.2 10/08/2012 1224   NITRITE NEGATIVE 01/11/2023 1128   LEUKOCYTESUR NEGATIVE 01/11/2023 1128    Sepsis Labs: Lactic Acid, Venous    Component Value Date/Time   LATICACIDVEN 1.9 01/11/2023 1500    MICROBIOLOGY: No results found for this or any previous visit (from the past 240 hour(s)).  RADIOLOGY STUDIES/RESULTS: CT  ABDOMEN PELVIS WO CONTRAST  Result Date: 01/11/2023 CLINICAL DATA:  Abdominal pain. EXAM: CT ABDOMEN AND PELVIS WITHOUT CONTRAST TECHNIQUE: Multidetector CT imaging of the abdomen and pelvis was performed following the standard protocol without IV contrast. RADIATION DOSE REDUCTION: This exam was performed according to the departmental dose-optimization program which includes automated exposure control, adjustment of the mA and/or kV according to patient size and/or use of iterative reconstruction technique. COMPARISON:  CTA GI bleed 10/21/2022; CT abdomen pelvis 05/22/2021 FINDINGS: Lower chest: No acute abnormality. Subsegmental atelectasis versus scarring at the lung bases. Hepatobiliary: No focal liver abnormality is seen. No gallstones, gallbladder wall thickening, or biliary dilatation. Pancreas: Unremarkable. No pancreatic ductal dilatation or surrounding inflammatory changes. Spleen: Normal in size without focal abnormality. Adrenals/Urinary Tract: Adrenal glands are unremarkable. Kidneys are normal, without renal calculi, focal lesion, or hydronephrosis. Bladder is unremarkable. Stomach/Bowel: Stomach is within normal limits. Appendix is not definitely visualized, however there are no pericecal inflammatory changes. No evidence of bowel wall thickening, distention, or inflammatory changes. Vascular/Lymphatic: Aortic atherosclerosis. No enlarged abdominal or pelvic lymph nodes. Reproductive: Status post hysterectomy. No adnexal masses. Other: Small fat containing umbilical hernia. No abdominopelvic ascites. Musculoskeletal: No acute or  suspicious osseous findings. Chronic compression fracture of the L2 and L3 vertebral bodies. Severe degenerative disc disease throughout the lumbar spine. Stable grade 1 anterolisthesis of L4 on L5. IMPRESSION: 1. No acute abnormality in the abdomen or pelvis. 2. Stable chronic findings as noted in the body of the report. 3. Aortic atherosclerosis. Aortic Atherosclerosis (ICD10-I70.0). Electronically Signed   By: Sherron Ales M.D.   On: 01/11/2023 15:02     LOS: 1 day   Jeoffrey Massed, MD  [facility]    To contact the attending provider between 7A-7P or the covering provider during after hours 7P-7A, please log into the web site [email or web address] and access using universal [place] password for that web site. If you do not have the password, please call the hospital operator.  01/12/2023, 9:59 AM

## 2023-01-13 ENCOUNTER — Other Ambulatory Visit (HOSPITAL_COMMUNITY): Payer: Self-pay

## 2023-01-13 DIAGNOSIS — K921 Melena: Secondary | ICD-10-CM | POA: Diagnosis not present

## 2023-01-13 DIAGNOSIS — K269 Duodenal ulcer, unspecified as acute or chronic, without hemorrhage or perforation: Secondary | ICD-10-CM | POA: Diagnosis not present

## 2023-01-13 DIAGNOSIS — J4520 Mild intermittent asthma, uncomplicated: Secondary | ICD-10-CM | POA: Diagnosis not present

## 2023-01-13 DIAGNOSIS — E876 Hypokalemia: Secondary | ICD-10-CM | POA: Diagnosis not present

## 2023-01-13 LAB — CBC
HCT: 24.6 % — ABNORMAL LOW (ref 36.0–46.0)
Hemoglobin: 8.4 g/dL — ABNORMAL LOW (ref 12.0–15.0)
MCH: 34 pg (ref 26.0–34.0)
MCHC: 34.1 g/dL (ref 30.0–36.0)
MCV: 99.6 fL (ref 80.0–100.0)
Platelets: 241 10*3/uL (ref 150–400)
RBC: 2.47 MIL/uL — ABNORMAL LOW (ref 3.87–5.11)
RDW: 17.7 % — ABNORMAL HIGH (ref 11.5–15.5)
WBC: 6.5 10*3/uL (ref 4.0–10.5)
nRBC: 0 % (ref 0.0–0.2)

## 2023-01-13 LAB — BASIC METABOLIC PANEL
Anion gap: 12 (ref 5–15)
BUN: 9 mg/dL (ref 8–23)
CO2: 26 mmol/L (ref 22–32)
Calcium: 9.1 mg/dL (ref 8.9–10.3)
Chloride: 94 mmol/L — ABNORMAL LOW (ref 98–111)
Creatinine, Ser: 0.72 mg/dL (ref 0.44–1.00)
GFR, Estimated: >60 mL/min (ref >60)
Glucose, Bld: 102 mg/dL — ABNORMAL HIGH (ref 70–99)
Potassium: 3 mmol/L — ABNORMAL LOW (ref 3.5–5.1)
Sodium: 132 mmol/L — ABNORMAL LOW (ref 135–145)

## 2023-01-13 LAB — PHOSPHORUS: Phosphorus: 1.9 mg/dL — ABNORMAL LOW (ref 2.5–4.6)

## 2023-01-13 LAB — MAGNESIUM: Magnesium: 1.4 mg/dL — ABNORMAL LOW (ref 1.7–2.4)

## 2023-01-13 MED ORDER — MAGNESIUM OXIDE -MG SUPPLEMENT 400 (240 MG) MG PO TABS: 400.0000 mg | ORAL_TABLET | Freq: Every day | ORAL | 0 refills | Status: DC

## 2023-01-13 MED ORDER — POTASSIUM CHLORIDE CRYS ER 20 MEQ PO TBCR: 40.0000 meq | EXTENDED_RELEASE_TABLET | Freq: Once | ORAL | Status: DC

## 2023-01-13 MED ORDER — POTASSIUM CHLORIDE CRYS ER 20 MEQ PO TBCR: 40.0000 meq | EXTENDED_RELEASE_TABLET | Freq: Every day | ORAL | 0 refills | Status: DC

## 2023-01-13 MED ORDER — PANTOPRAZOLE SODIUM 40 MG PO TBEC
40.0000 mg | DELAYED_RELEASE_TABLET | Freq: Two times a day (BID) | ORAL | 2 refills | Status: DC
  Filled 2023-01-13: qty 60, 30d supply, fill #0

## 2023-01-13 MED ORDER — K PHOS MONO-SOD PHOS DI & MONO 155-852-130 MG PO TABS: 250.0000 mg | ORAL_TABLET | Freq: Two times a day (BID) | ORAL | 0 refills | Status: AC

## 2023-01-13 MED ORDER — MAGNESIUM SULFATE 2 GM/50ML IV SOLN: 2.0000 g | Freq: Once | INTRAVENOUS | Status: DC

## 2023-01-13 MED ORDER — POTASSIUM PHOSPHATES 15 MMOLE/5ML IV SOLN
30.0000 mmol | Freq: Once | INTRAVENOUS | Status: DC
  Filled 2023-01-13: qty 10

## 2023-01-13 NOTE — Anesthesia Postprocedure Evaluation (Signed)
Anesthesia Post Note  Patient: Catara Stache  Procedure(s) Performed: ESOPHAGOGASTRODUODENOSCOPY (EGD) WITH PROPOFOL BIOPSY     Patient location during evaluation: PACU Anesthesia Type: MAC Level of consciousness: awake and alert Pain management: pain level controlled Vital Signs Assessment: post-procedure vital signs reviewed and stable Respiratory status: spontaneous breathing, nonlabored ventilation, respiratory function stable and patient connected to nasal cannula oxygen Cardiovascular status: stable and blood pressure returned to baseline Postop Assessment: no apparent nausea or vomiting Anesthetic complications: no   No notable events documented.  Last Vitals:  Vitals:   01/13/23 0400 01/13/23 0912  BP: 105/70 103/60  Pulse: 96   Resp: 18 17  Temp:    SpO2: 100%     Last Pain:  Vitals:   01/13/23 0800  TempSrc:   PainSc: 0-No pain                 Collene Schlichter

## 2023-01-13 NOTE — TOC Transition Note (Signed)
Transition of Care [facility]) - CM/SW Discharge Note   Patient Details  Name: Angel Hebert MRN: [id number] Date of Birth: 06-16-1956  [facility] CM/SW Contact:  Gordy Clement, RN Phone Number: 01/13/2023, 9:15 AM   Clinical Narrative:    Patient to DC to home with Family.  There are no recommendations for [facility] PT or OT .  No DME needed . TOC signing off- No TOC needs           Patient Goals and CMS Choice      Discharge Placement                         Discharge Plan and Services Additional resources added to the After Visit Summary for                                       Social Determinants of Health (SDOH) Interventions SDOH Screenings   Food Insecurity: No Food Insecurity (01/11/2023)  Housing: Low Risk  (01/11/2023)  Transportation Needs: Unmet Transportation Needs (01/11/2023)  Utilities: Not At Risk (01/11/2023)  Depression (PHQ2-9): Low Risk  (08/22/2021)  Tobacco Use: Low Risk  (01/12/2023)     Readmission Risk Interventions     No data to display

## 2023-01-13 NOTE — Discharge Summary (Addendum)
PATIENT DETAILS Name: Angel Hebert Age: 67 y.o. Sex: female Date of Birth: [birth year] MRN: [id number]. Admitting Physician: Synetta Fail, MD [id number], [name], MD  Admit Date: 01/11/2023 Discharge date: 01/13/2023  Recommendations for Outpatient Follow-up:  Follow up with PCP in 1-2 weeks Please obtain CMP/CBC in one week  Admitted From:  Home  Disposition: Home   Discharge Condition: good  CODE STATUS:   Code Status: Full Code   Diet recommendation:  Diet Order             Diet - low sodium heart healthy           DIET SOFT Room service appropriate? Yes; Fluid consistency: Thin  Diet effective now                    Brief Summary: Patient is a 67 y.o.  female prior history of upper GI bleed due to duodenal ulcer-EtOH use-who presented with upper GI bleeding with acute blood loss anemia and abdominal pain.    Significant events: 5/11>> admit to [facility]   Significant studies: 5/11>> CT abdomen/pelvis: No acute abnormality.   Significant microbiology data: None   Procedures: 5/12>> EGD: - LA Grade B esophagitis with no bleeding. - 2 cm hiatal hernia. - Gastritis. Biopsied, to exclude H. Pylori. - Non-bleeding duodenal ulcers.   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 Gabriel Carina for discussion of repeat colonoscopy   Upper abdominal pain Likely due to EtOH related gastritis/PUD Lipase levels normal-no significant abnormality seen on CT chest Abdominal pain  has resolved.   AKI Hemodynamically mediated Improved   Hyponatremia/hypokalemia/hypophosphatemia/hypomagnesemia Due to GI bleed/EtOH use Replete K/Mg/pO4 prior to discharge.   HLD Statin   HTN BP stable-losartan/HCTZ on hold in the setting of GI bleed.  Since BP remains stable/soft-continue to hold until seen by outpatient PCP.   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 Continue bronchodilators   BMI: Estimated body mass index is 24.21 kg/m as calculated from the following:   Height as of this encounter: 5\' 6"  (1.676 m).   Weight as of this encounter: 68 k   Discharge Diagnoses:  Principal Problem:   GI bleed Active Problems:   Hyperlipidemia   Essential hypertension   Asthma   GERD   Anemia, iron deficiency   Hypokalemia   Gastritis   AKI (acute kidney injury) (HCC)   Hyponatremia   Duodenal ulcer   Alcoholic ketosis (HCC)   Melena   Discharge Instructions:  Activity:  As tolerated   Discharge Instructions     Call MD for:   Complete by: As directed    Melena   Diet - low sodium heart healthy   Complete by: As directed    Discharge instructions   Complete by: As directed    Follow with Primary MD  [name], MD in 1-2 weeks  Please follow-up with GI-Dr. Rosalia Hammers for outpatient colonoscopy.  Please give the office a call in the next 2-3 weeks.  EGD biopsy results are pending-this will need to be followed by a gastroenterologist or primary care practitioner. Stop all  alcohol/ibuprofen/NSAID use.  Please continue to hold your blood pressure medications-as her blood pressure is still soft/on the lower side-follow-up with your primary care practitioner and resume these medications once her blood pressure has stabilized further.  Please get a complete blood count and chemistry panel checked by your Primary MD at your next visit, and again as instructed by your Primary MD.  Get Medicines reviewed and adjusted: Please take all your medications with you  for your next visit with your Primary MD  Laboratory/radiological data: Please request your Primary MD to go over all hospital tests and procedure/radiological results at the follow up, please ask your Primary MD to get all Hospital records sent to his/her office.  In some cases, they will be blood work, cultures and biopsy results pending at the time of your discharge. Please request that your primary care M.D. follows up on these results.  Also Note the following: If you experience worsening of your admission symptoms, develop shortness of breath, life threatening emergency, suicidal or homicidal thoughts you must seek medical attention immediately by calling 911 or calling your MD immediately  if symptoms less severe.  You must read complete instructions/literature along with all the possible adverse reactions/side effects for all the Medicines you take and that have been prescribed to you. Take any new Medicines after you have completely understood and accpet all the possible adverse reactions/side effects.   Do not drive when taking Pain medications or sleeping medications (Benzodaizepines)  Do not take more than prescribed Pain, Sleep and Anxiety Medications. It is not advisable to combine anxiety,sleep and pain medications without talking with your primary care practitioner  Special Instructions: If you have smoked or chewed Tobacco  in the last 2 yrs please stop smoking, stop any regular Alcohol  and or any Recreational drug use.  Wear Seat belts while driving.  Please note: You were cared for by a hospitalist during your hospital stay. Once you are discharged, your primary care physician will handle any further medical issues. Please note that NO REFILLS for any discharge medications will be authorized once you are discharged, as it is imperative that you return to your primary care physician (or establish a relationship with a primary care physician if you do not have one) for your  post hospital discharge needs so that they can reassess your need for medications and monitor your lab values.   Increase activity slowly   Complete by: As directed       Allergies as of 01/13/2023   No Known Allergies      Medication List     STOP taking these medications    losartan-hydrochlorothiazide 100-25 MG tablet Commonly known as: HYZAAR       TAKE these medications    albuterol (2.5 MG/3ML) 0.083% nebulizer solution Commonly known as: PROVENTIL Take 3 mLs (2.5 mg total) by nebulization every 6 (six) hours as needed for wheezing or shortness of breath.   albuterol 108 (90 Base) MCG/ACT inhaler Commonly known as: VENTOLIN HFA TAKE 2 PUFFS BY MOUTH EVERY 6  HOURS AS NEEDED FOR WHEEZE OR  SHORTNESS OF BREATH   atorvastatin 20 MG tablet Commonly known as: LIPITOR TAKE 1 TABLET BY MOUTH DAILY AT 6PM What changed: See the new instructions.   fluticasone 50 MCG/ACT nasal spray Commonly known as: FLONASE Place 1 spray into both nostrils daily.   folic acid 1 MG tablet Commonly known as: FOLVITE Take 1 tablet (1 mg total) by mouth daily.   HYDROcodone-acetaminophen 5-325  MG tablet Commonly known as: NORCO/VICODIN Take 1 tablet by mouth every 6 (six) hours as needed for severe pain.   magnesium oxide 400 (240 Mg) MG tablet Commonly known as: MAGnesium-Oxide Take 1 tablet (400 mg total) by mouth daily.   pantoprazole 40 MG tablet Commonly known as: PROTONIX Take 1 tablet (40 mg total) by mouth 2 (two) times daily.   phosphorus 155-852-130 MG tablet Commonly known as: K PHOS NEUTRAL Take 1 tablet (250 mg total) by mouth 2 (two) times daily for 5 days.   potassium chloride SA 20 MEQ tablet Commonly known as: KLOR-CON M Take 2 tablets (40 mEq total) by mouth daily.   thiamine 100 MG tablet Commonly known as: VITAMIN B1 Take 1 tablet (100 mg total) by mouth daily.   Wixela Inhub 500-50 MCG/ACT Aepb Generic drug: fluticasone-salmeterol INHALE 1 PUFF INTO  THE LUNGS IN THE MORNING AND AT BEDTIME. What changed:  when to take this additional instructions        Follow-up Information     [name], MD. Schedule an appointment as soon as possible for a visit in 1 week(s).   Specialty: Family Medicine Contact information: [address] [place] [address] [place] [address] [phone]         Napoleon Form, MD. Schedule an appointment as soon as possible for a visit in 2 day(s).   Specialty: Gastroenterology Contact information: [address] [place] [address] [phone]                No Known Allergies   Other Procedures/Studies: CT ABDOMEN PELVIS WO CONTRAST  Result Date: 01/11/2023 CLINICAL DATA:  Abdominal pain. EXAM: CT ABDOMEN AND PELVIS WITHOUT CONTRAST TECHNIQUE: Multidetector CT imaging of the abdomen and pelvis was performed following the standard protocol without IV contrast. RADIATION DOSE REDUCTION: This exam was performed according to the departmental dose-optimization program which includes automated exposure control, adjustment of the mA and/or kV according to patient size and/or use of iterative reconstruction technique. COMPARISON:  CTA GI bleed 10/21/2022; CT abdomen pelvis 05/22/2021 FINDINGS: Lower chest: No acute abnormality. Subsegmental atelectasis versus scarring at the lung bases. Hepatobiliary: No focal liver abnormality is seen. No gallstones, gallbladder wall thickening, or biliary dilatation. Pancreas: Unremarkable. No pancreatic ductal dilatation or surrounding inflammatory changes. Spleen: Normal in size without focal abnormality. Adrenals/Urinary Tract: Adrenal glands are unremarkable. Kidneys are normal, without renal calculi, focal lesion, or hydronephrosis. Bladder is unremarkable. Stomach/Bowel: Stomach is within normal limits. Appendix is not definitely visualized, however there are no pericecal inflammatory changes. No evidence of bowel wall thickening, distention, or  inflammatory changes. Vascular/Lymphatic: Aortic atherosclerosis. No enlarged abdominal or pelvic lymph nodes. Reproductive: Status post hysterectomy. No adnexal masses. Other: Small fat containing umbilical hernia. No abdominopelvic ascites. Musculoskeletal: No acute or suspicious osseous findings. Chronic compression fracture of the L2 and L3 vertebral bodies. Severe degenerative disc disease throughout the lumbar spine. Stable grade 1 anterolisthesis of L4 on L5. IMPRESSION: 1. No acute abnormality in the abdomen or pelvis. 2. Stable chronic findings as noted in the body of the report. 3. Aortic atherosclerosis. Aortic Atherosclerosis (ICD10-I70.0). Electronically Signed   By: Sherron Ales M.D.   On: 01/11/2023 15:02   XR KNEE 3 VIEW RIGHT  Result Date: 12/27/2022 AP lateral merchant radiographs right knee reviewed.  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  No acute fracture.  XR KNEE 3 VIEW LEFT  Result Date: 12/27/2022 AP lateral merchant radiographs left knee  reviewed.  Left total knee prosthesis in good position and alignment.  There has been fracture fixation of distal femur fracture with intramedullary nail.  Alignment intact.  No new fracture.    TODAY-DAY OF DISCHARGE:  Subjective:   Angel Hebert today has no headache,no chest abdominal pain,no new weakness tingling or numbness, feels much better wants to go home today.   Objective:   Blood pressure 103/60, pulse 96, temperature 98.1 F (36.7 C), temperature source Oral, resp. rate 17, height 5\' 6"  (1.676 m), weight 68 kg, SpO2 100 %.  Intake/Output Summary (Last 24 hours) at 01/13/2023 1035 Last data filed at 01/12/2023 1850 Gross per 24 hour  Intake 1178.54 ml  Output 2 ml  Net 1176.54 ml   Filed Weights   01/11/23 1028  Weight: 68 kg    Exam: Awake Alert, Oriented *3, No new F.N deficits, Normal affect [place].AT,PERRAL Supple Neck,No JVD, No cervical lymphadenopathy  appriciated.  Symmetrical Chest wall movement, Good air movement bilaterally, CTAB RRR,No Gallops,Rubs or new Murmurs, No Parasternal Heave +ve B.Sounds, Abd Soft, Non tender, No organomegaly appriciated, No rebound -guarding or rigidity. No Cyanosis, Clubbing or edema, No new Rash or bruise   PERTINENT RADIOLOGIC STUDIES: CT ABDOMEN PELVIS WO CONTRAST  Result Date: 01/11/2023 CLINICAL DATA:  Abdominal pain. EXAM: CT ABDOMEN AND PELVIS WITHOUT CONTRAST TECHNIQUE: Multidetector CT imaging of the abdomen and pelvis was performed following the standard protocol without IV contrast. RADIATION DOSE REDUCTION: This exam was performed according to the departmental dose-optimization program which includes automated exposure control, adjustment of the mA and/or kV according to patient size and/or use of iterative reconstruction technique. COMPARISON:  CTA GI bleed 10/21/2022; CT abdomen pelvis 05/22/2021 FINDINGS: Lower chest: No acute abnormality. Subsegmental atelectasis versus scarring at the lung bases. Hepatobiliary: No focal liver abnormality is seen. No gallstones, gallbladder wall thickening, or biliary dilatation. Pancreas: Unremarkable. No pancreatic ductal dilatation or surrounding inflammatory changes. Spleen: Normal in size without focal abnormality. Adrenals/Urinary Tract: Adrenal glands are unremarkable. Kidneys are normal, without renal calculi, focal lesion, or hydronephrosis. Bladder is unremarkable. Stomach/Bowel: Stomach is within normal limits. Appendix is not definitely visualized, however there are no pericecal inflammatory changes. No evidence of bowel wall thickening, distention, or inflammatory changes. Vascular/Lymphatic: Aortic atherosclerosis. No enlarged abdominal or pelvic lymph nodes. Reproductive: Status post hysterectomy. No adnexal masses. Other: Small fat containing umbilical hernia. No abdominopelvic ascites. Musculoskeletal: No acute or suspicious osseous findings. Chronic  compression fracture of the L2 and L3 vertebral bodies. Severe degenerative disc disease throughout the lumbar spine. Stable grade 1 anterolisthesis of L4 on L5. IMPRESSION: 1. No acute abnormality in the abdomen or pelvis. 2. Stable chronic findings as noted in the body of the report. 3. Aortic atherosclerosis. Aortic Atherosclerosis (ICD10-I70.0). Electronically Signed   By: Sherron Ales M.D.   On: 01/11/2023 15:02     PERTINENT LAB RESULTS: CBC: Recent Labs    01/12/23 1726 01/13/23 0752  WBC 6.6 6.5  HGB 8.1* 8.4*  HCT 23.7* 24.6*  PLT 261 241   CMET CMP     Component Value Date/Time   NA 132 (L) 01/13/2023 0752   NA 143 08/22/2021 1406   K 3.0 (L) 01/13/2023 0752   CL 94 (L) 01/13/2023 0752   CO2 26 01/13/2023 0752   GLUCOSE 102 (H) 01/13/2023 0752   BUN 9 01/13/2023 0752   BUN 11 08/22/2021 1406   CREATININE 0.72 01/13/2023 0752   CREATININE 0.55 05/20/2016 1452   CALCIUM 9.1 01/13/2023 0752  PROT 5.5 (L) 01/12/2023 0315   PROT 7.2 08/22/2021 1406   ALBUMIN 2.8 (L) 01/12/2023 0315   ALBUMIN 4.3 08/22/2021 1406   AST 22 01/12/2023 0315   ALT 11 01/12/2023 0315   ALKPHOS 58 01/12/2023 0315   BILITOT 1.0 01/12/2023 0315   BILITOT 1.4 (H) 08/22/2021 1406   GFRNONAA >60 01/13/2023 0752   GFRNONAA >89 05/20/2016 1452   GFRAA >60 04/19/2020 0325   GFRAA >89 05/20/2016 1452    GFR Estimated Creatinine Clearance: 64.8 mL/min (by C-G formula based on SCr of 0.72 mg/dL). Recent Labs    01/11/23 1500  LIPASE 37   No results for input(s): "CKTOTAL", "CKMB", "CKMBINDEX", "TROPONINI" in the last 72 hours. Invalid input(s): "POCBNP" No results for input(s): "DDIMER" in the last 72 hours. No results for input(s): "HGBA1C" in the last 72 hours. No results for input(s): "CHOL", "HDL", "LDLCALC", "TRIG", "CHOLHDL", "LDLDIRECT" in the last 72 hours. No results for input(s): "TSH", "T4TOTAL", "T3FREE", "THYROIDAB" in the last 72 hours.  Invalid input(s): "FREET3" No  results for input(s): "VITAMINB12", "FOLATE", "FERRITIN", "TIBC", "IRON", "RETICCTPCT" in the last 72 hours. Coags: Recent Labs    01/11/23 1500  INR 1.0   Microbiology: No results found for this or any previous visit (from the past 240 hour(s)).  FURTHER DISCHARGE INSTRUCTIONS:  Get Medicines reviewed and adjusted: Please take all your medications with you for your next visit with your Primary MD  Laboratory/radiological data: Please request your Primary MD to go over all hospital tests and procedure/radiological results at the follow up, please ask your Primary MD to get all Hospital records sent to his/her office.  In some cases, they will be blood work, cultures and biopsy results pending at the time of your discharge. Please request that your primary care M.D. goes through all the records of your hospital data and follows up on these results.  Also Note the following: If you experience worsening of your admission symptoms, develop shortness of breath, life threatening emergency, suicidal or homicidal thoughts you must seek medical attention immediately by calling 911 or calling your MD immediately  if symptoms less severe.  You must read complete instructions/literature along with all the possible adverse reactions/side effects for all the Medicines you take and that have been prescribed to you. Take any new Medicines after you have completely understood and accpet all the possible adverse reactions/side effects.   Do not drive when taking Pain medications or sleeping medications (Benzodaizepines)  Do not take more than prescribed Pain, Sleep and Anxiety Medications. It is not advisable to combine anxiety,sleep and pain medications without talking with your primary care practitioner  Special Instructions: If you have smoked or chewed Tobacco  in the last 2 yrs please stop smoking, stop any regular Alcohol  and or any Recreational drug use.  Wear Seat belts while driving.  Please  note: You were cared for by a hospitalist during your hospital stay. Once you are discharged, your primary care physician will handle any further medical issues. Please note that NO REFILLS for any discharge medications will be authorized once you are discharged, as it is imperative that you return to your primary care physician (or establish a relationship with a primary care physician if you do not have one) for your post hospital discharge needs so that they can reassess your need for medications and monitor your lab values.  Total Time spent coordinating discharge including counseling, education and face to face time equals greater than 30 minutes.  SignedJeoffrey Massed 01/13/2023 10:35 AM

## 2023-01-13 NOTE — Progress Notes (Signed)
Called Angel Hebert at [phone] and left a message stating Dr Jerral Ralph send a prescription to [facility] for magnesium, potassium and potassium phosphate, and gave instruction to fill prescription and take as prescribed. Her blood levels were low and she needed those electrolytes. Also attempted to call sister Angel Hebert who did not pick up her phone, S.O. Angel Hebert's number was not valid. Provided my phone number as a call-back number.

## 2023-01-13 NOTE — Progress Notes (Addendum)
Mrs. Cervantez was discharged without my knowledge by another RN (Consulting civil engineer) before receiving her Magnesium, Phosphorus and Potassium. Dr Jerral Ralph notified.

## 2023-01-14 ENCOUNTER — Telehealth: Payer: Self-pay

## 2023-01-14 ENCOUNTER — Encounter (HOSPITAL_COMMUNITY): Payer: Self-pay | Admitting: Internal Medicine

## 2023-01-14 NOTE — Transitions of Care (Post Inpatient/ED Visit) (Signed)
   01/14/2023  Name: Angel Hebert MRN: [id number] DOB: [birth year]  Today's TOC FU Call Status: Today's TOC FU Call Status:: Unsuccessul Call (1st Attempt) Unsuccessful Call (1st Attempt) Date: 01/14/23  Attempted to reach the patient regarding the most recent Inpatient/ED visit.  Follow Up Plan: Additional outreach attempts will be made to reach the patient to complete the Transitions of Care (Post Inpatient/ED visit) call.     Antionette Fairy, RN,BSN,CCM [facility] Care Management Care Management Community Coordinator Direct Phone: [phone] Toll Free: [phone] Fax: [phone]

## 2023-01-15 ENCOUNTER — Telehealth: Payer: Self-pay

## 2023-01-15 LAB — SURGICAL PATHOLOGY

## 2023-01-15 NOTE — Transitions of Care (Post Inpatient/ED Visit) (Signed)
   01/15/2023  Name: Angel Hebert MRN: [id number] DOB: 1956-06-07  Today's TOC FU Call Status: Today's TOC FU Call Status:: Unsuccessful Call (2nd Attempt) Unsuccessful Call (2nd Attempt) Date: 01/15/23  Attempted to reach the patient regarding the most recent Inpatient/ED visit.  Follow Up Plan: Additional outreach attempts will be made to reach the patient to complete the Transitions of Care (Post Inpatient/ED visit) call.      Antionette Fairy, RN,BSN,CCM [facility] Care Management Care Management Community Coordinator Direct Phone: [phone] Toll Free: [phone] Fax: [phone]

## 2023-01-15 NOTE — Transitions of Care (Post Inpatient/ED Visit) (Signed)
   01/15/2023  Name: Angel Hebert MRN: [id number] DOB: 03/24/1956  Today's TOC FU Call Status: Today's TOC FU Call Status:: Unsuccessful Call (3rd Attempt) Unsuccessful Call (3rd Attempt) Date: 01/15/23  Attempted to reach the patient regarding the most recent Inpatient/ED visit.  Follow Up Plan: No further outreach attempts will be made at this time. We have been unable to contact the patient.    Antionette Fairy, RN,BSN,CCM [facility] Care Management Care Management Community Coordinator Direct Phone: [phone] Toll Free: [phone] Fax: [phone]

## 2023-01-22 ENCOUNTER — Telehealth: Payer: Self-pay

## 2023-01-22 ENCOUNTER — Ambulatory Visit: Payer: 59 | Admitting: Family Medicine

## 2023-01-22 ENCOUNTER — Other Ambulatory Visit: Payer: Self-pay

## 2023-01-22 MED ORDER — METRONIDAZOLE 250 MG PO TABS: 250.0000 mg | ORAL_TABLET | Freq: Four times a day (QID) | ORAL | 0 refills | Status: DC

## 2023-01-22 MED ORDER — BISMUTH 262 MG PO CHEW: 524.0000 mg | CHEWABLE_TABLET | Freq: Four times a day (QID) | ORAL | 0 refills | Status: DC

## 2023-01-22 MED ORDER — AMOXICILLIN 500 MG PO CAPS: 500.0000 mg | ORAL_CAPSULE | Freq: Four times a day (QID) | ORAL | 0 refills | Status: DC

## 2023-01-22 NOTE — Telephone Encounter (Signed)
Pt already scheduled for OV. 

## 2023-01-22 NOTE — Telephone Encounter (Signed)
-----   Message from Doree Albee, [place] sent at 01/12/2023 10:14 AM EDT ----- Regarding: Please set up a hospital follow up! Thanks! Patient of Dr. Lavon Paganini,  Need follow up with our office for hospital visit, duodenal ulcer, needs repeat EGD 8-12 week, possible colon with it.  Thanks!

## 2023-01-26 ENCOUNTER — Other Ambulatory Visit: Payer: Self-pay | Admitting: Family Medicine

## 2023-01-26 DIAGNOSIS — J4520 Mild intermittent asthma, uncomplicated: Secondary | ICD-10-CM

## 2023-01-28 NOTE — Telephone Encounter (Signed)
Requested medications are due for refill today.  Unsure  Requested medications are on the active medications list.  no  Last refill. unsure  Future visit scheduled.   yes  Notes to clinic.  Med not on med list. Please review.    Requested Prescriptions  Pending Prescriptions Disp Refills   losartan-hydrochlorothiazide (HYZAAR) 100-25 MG tablet [Pharmacy Med Name: Losartan Potassium-HCTZ 100-25 MG Oral Tablet] 30 tablet 11    Sig: TAKE 1 TABLET BY MOUTH DAILY     Cardiovascular: ARB + Diuretic Combos Failed - 01/26/2023  8:29 AM      Failed - K in normal range and within 180 days    Potassium  Date Value Ref Range Status  01/13/2023 3.0 (L) 3.5 - 5.1 mmol/L Final         Failed - Na in normal range and within 180 days    Sodium  Date Value Ref Range Status  01/13/2023 132 (L) 135 - 145 mmol/L Final  08/22/2021 143 134 - 144 mmol/L Final         Failed - Valid encounter within last 6 months    Recent Outpatient Visits           1 year ago Hypokalemia   [place] [facility] [place], [place], [place]   1 year ago Melena   [place] [facility] [name], MD   3 years ago Pure hypercholesterolemia   [place] [facility] [place], [name], MD   3 years ago Hypertensive heart disease with chronic combined systolic and diastolic congestive heart failure [facility])   [place] [facility] [place], [place], [place]   3 years ago Anemia due to other cause, not classified   [place] [facility] [name], MD       Future Appointments             In 2 months [name], MD [facility]            Passed - Cr in normal range and within 180 days    Creat  Date Value Ref Range Status  05/20/2016 0.55 0.50 - 1.05 mg/dL Final    Comment:      For patients > or = 66 years of age: The upper reference limit  for Creatinine is approximately 13% higher for people identified as African-American.      Creatinine, Ser  Date Value Ref Range Status  01/13/2023 0.72 0.44 - 1.00 mg/dL Final         Passed - eGFR is 10 or above and within 180 days    GFR, Est African American  Date Value Ref Range Status  05/20/2016 >89 >=60 mL/min Final   GFR calc Af Amer  Date Value Ref Range Status  04/19/2020 >60 >60 mL/min Final   GFR, Est Non African American  Date Value Ref Range Status  05/20/2016 >89 >=60 mL/min Final   GFR, Estimated  Date Value Ref Range Status  01/13/2023 >60 >60 mL/min Final    Comment:    (NOTE) Calculated using the CKD-EPI Creatinine Equation (2021)    eGFR  Date Value Ref Range Status  08/22/2021 65 >59 mL/min/1.73 Final         Passed - Patient is not pregnant      Passed - Last BP in normal range    BP Readings from Last 1 Encounters:  01/13/23 103/60         Signed Prescriptions Disp Refills   fluticasone-salmeterol (ADVAIR) 500-50 MCG/ACT AEPB 60 each 0    Sig: INHALE ONE PUFF BY MOUTH INTO  THE LUNGS IN THE MORNING AND AT  BEDTIME     Pulmonology:  Combination Products Failed - 01/26/2023  8:29 AM      Failed - Valid encounter within last 12 months    Recent Outpatient Visits           1 year ago Hypokalemia   [facility] [place], [place], [place]   1 year ago Melena   [place] [facility] [name], MD   3 years ago Pure hypercholesterolemia   [place] [facility] [place], [name], MD   3 years ago Hypertensive heart disease with chronic combined systolic and diastolic congestive heart failure [facility])   [place] [facility] [place], [name], MD   3 years ago Anemia due to other cause, not classified   [place] [facility] [name], MD       Future Appointments             In 2 months [name], MD [facility]

## 2023-01-28 NOTE — Telephone Encounter (Signed)
Requested Prescriptions  Pending Prescriptions Disp Refills   fluticasone-salmeterol (ADVAIR) 500-50 MCG/ACT AEPB [Pharmacy Med Name: FLUTICASONE/SALMET DIS 500-50MCG] 60 each 0    Sig: INHALE ONE PUFF BY MOUTH INTO  THE LUNGS IN THE MORNING AND AT  BEDTIME     Pulmonology:  Combination Products Failed - 01/26/2023  8:29 AM      Failed - Valid encounter within last 12 months    Recent Outpatient Visits           1 year ago Hypokalemia   [place] [facility] [place], [place], [place]   1 year ago Melena   [place] [facility] [place], [place], [place]   3 years ago Pure hypercholesterolemia   [place] [facility] [place], [name], MD   3 years ago Hypertensive heart disease with chronic combined systolic and diastolic congestive heart failure (HCC)   [place] [facility] [place], [name], MD   3 years ago Anemia due to other cause, not classified   [place] [facility] [place], [name], MD       Future Appointments             In 2 months [name], MD [facility]             losartan-hydrochlorothiazide (HYZAAR) 100-25 MG tablet [Pharmacy Med Name: Losartan Potassium-HCTZ 100-25 MG Oral Tablet] 30 tablet 11    Sig: TAKE 1 TABLET BY MOUTH DAILY     Cardiovascular: ARB + Diuretic Combos Failed - 01/26/2023  8:29 AM      Failed - K in normal range and within 180 days    Potassium  Date Value Ref Range Status  01/13/2023 3.0 (L) 3.5 - 5.1 mmol/L Final         Failed - Na in normal range and within 180 days    Sodium  Date Value Ref Range Status  01/13/2023 132 (L) 135 - 145 mmol/L Final  08/22/2021 143 134 - 144 mmol/L Final         Failed - Valid encounter within last 6 months    Recent Outpatient Visits           1 year ago Hypokalemia   [place] [facility] [place], [place], [place]    1 year ago Melena   [place] [facility] [name], MD   3 years ago Pure hypercholesterolemia   [facility] [place], [name], MD   3 years ago Hypertensive heart disease with chronic combined systolic and diastolic congestive heart failure [facility])   [place] [facility] [place], [place], [place]   3 years ago Anemia due to other cause, not classified    [facility] [name], MD       Future Appointments             In 2 months [name], MD [facility]            Passed - Cr in normal range and within 180 days    Creat  Date Value Ref Range Status  05/20/2016 0.55 0.50 - 1.05 mg/dL Final    Comment:      For patients > or = 66 years of age: The upper reference limit for Creatinine is approximately 13%  higher for people identified as African-American.      Creatinine, Ser  Date Value Ref Range Status  01/13/2023 0.72 0.44 - 1.00 mg/dL Final         Passed - eGFR is 10 or above and within 180 days    GFR, Est African American  Date Value Ref Range Status  05/20/2016 >89 >=60 mL/min Final   GFR calc Af Amer  Date Value Ref Range Status  04/19/2020 >60 >60 mL/min Final   GFR, Est Non African American  Date Value Ref Range Status  05/20/2016 >89 >=60 mL/min Final   GFR, Estimated  Date Value Ref Range Status  01/13/2023 >60 >60 mL/min Final    Comment:    (NOTE) Calculated using the CKD-EPI Creatinine Equation (2021)    eGFR  Date Value Ref Range Status  08/22/2021 65 >59 mL/min/1.73 Final         Passed - Patient is not pregnant      Passed - Last BP in normal range    BP Readings from Last 1 Encounters:  01/13/23 103/60

## 2023-01-30 ENCOUNTER — Ambulatory Visit: Payer: Self-pay | Admitting: *Deleted

## 2023-01-30 NOTE — Telephone Encounter (Signed)
  Chief Complaint: Nose Bleed Symptoms: Small clot only when blowing nose this AM, 2 episodes. States also blood on tissue. Frequency: This AM Pertinent Negatives: Patient denies headache, dizziness Disposition: [] ED /[x] Urgent Care (no appt availability in office) / [] Appointment(In office/virtual)/ []  [place]/ [] Home Care/ [] Refused Recommended Disposition /[] [place] [facility]/ []  Follow-up with PCP Additional Notes: Speaking with pts BF, pt present. Evasive historian. States off BP meds since D/Ced from hospital 01/11/23. Calling requesting new BP med be called in for pt. States was taken off losartan due to potassium. Confusing Protonix with BP meds. Missed appt 01/22/23. Repeats "Just worried her BP is high and she needs a med."  Cannot check BP at home, has no BP values.  Pt states she feels like BP is high, denies any associated symptoms. Advised UC, declines.Assured pt NT would route to practice for PCPs review. Advised ED for worsening symptoms. Reason for Disposition  [1] Bleeding recurs 3 or more times in 24 hours AND [2] direct pressure applied correctly    Only with blowing nose. Pt concerned with BP  Answer Assessment - Initial Assessment Questions 1. AMOUNT OF BLEEDING: "How bad is the bleeding?" "How much blood was lost?" "Has the bleeding stopped?"   - MILD: needed a couple tissues   - MODERATE: needed many tissues   - SEVERE: large blood clots, soaked many tissues, lasted more than 30 minutes       2. ONSET: "When did the nosebleed start?"      This Am 3. FREQUENCY: "How many nosebleeds have you had in the last 24 hours?"      *No Answer* 4. RECURRENT SYMPTOMS: "Have there been other recent nosebleeds?" If Yes, ask: "How long did it take you to stop the bleeding?" "What worked best?"      *No Answer* 5. CAUSE: "What do you think caused this nosebleed?"     *No Answer* 6. LOCAL FACTORS: "Do you have any cold symptoms?", "Have you been rubbing or picking at  your nose?"     *No Answer* 7. SYSTEMIC FACTORS: "Do you have high blood pressure or any bleeding problems?"     *No Answer* 8. BLOOD THINNERS: "Do you take any blood thinners?" (e.g., aspirin, clopidogrel / Plavix, coumadin, heparin). Notes: Other strong blood thinners include: Arixtra (fondaparinux), Eliquis (apixaban), Pradaxa (dabigatran), and Xarelto (rivaroxaban).     *No Answer* 9. OTHER SYMPTOMS: "Do you have any other symptoms?" (e.g., lightheadedness)     Headache  Protocols used: Nosebleed-A-AH

## 2023-01-31 DIAGNOSIS — M2352 Chronic instability of knee, left knee: Secondary | ICD-10-CM | POA: Diagnosis not present

## 2023-01-31 DIAGNOSIS — S83522A Sprain of posterior cruciate ligament of left knee, initial encounter: Secondary | ICD-10-CM | POA: Diagnosis not present

## 2023-01-31 NOTE — Telephone Encounter (Signed)
Routing to PCP for review.

## 2023-01-31 NOTE — Telephone Encounter (Signed)
She needs an urgent care visit for further evaluation.

## 2023-01-31 NOTE — Telephone Encounter (Signed)
Attempted to contact patient and VM was left informing patient to return phone call. 

## 2023-02-13 ENCOUNTER — Telehealth: Payer: Self-pay | Admitting: Internal Medicine

## 2023-02-13 NOTE — Telephone Encounter (Signed)
PT is calling to discuss results per the letter she received. Please advise

## 2023-02-13 NOTE — Telephone Encounter (Signed)
Patient is calling again to follow up on the letter she received in the mail regarding her labs. Please advise

## 2023-02-14 NOTE — Telephone Encounter (Signed)
Left voicemail for patient to call back.  We were trying to reach her regarding the following information: Beverley Fiedler, MD 01/16/2023 12:48 PM EDT     Please let patient know that she has H. pylori gastritis She also has 2 large duodenal ulcers   She has follow-up in place which she should keep   Pylera or Talicia plus twice daily PPI is recommended at this time We can check for H. pylori eradication at follow-up She is to stay on twice daily PPI until her office follow-up She should not drink alcohol or use NSAIDs   Once reached by phone, no pathology letter needed   Patient is also scheduled for a follow up with Nat Math 03/13/23.

## 2023-02-17 ENCOUNTER — Other Ambulatory Visit: Payer: Self-pay

## 2023-02-17 ENCOUNTER — Other Ambulatory Visit: Payer: Self-pay | Admitting: Internal Medicine

## 2023-02-17 MED ORDER — PANTOPRAZOLE SODIUM 40 MG PO TBEC: 40.0000 mg | DELAYED_RELEASE_TABLET | Freq: Two times a day (BID) | ORAL | 0 refills | Status: DC

## 2023-02-17 MED ORDER — BISMUTH/METRONIDAZ/TETRACYCLIN 140-125-125 MG PO CAPS: 3.0000 | ORAL_CAPSULE | Freq: Four times a day (QID) | ORAL | 0 refills | Status: DC

## 2023-02-17 NOTE — Telephone Encounter (Signed)
Spoke with pts fiance and he is aware of results and knows about follow-up appt and results. Scripts sent to pharmacy.

## 2023-02-17 NOTE — Telephone Encounter (Signed)
Left message for pt to call back  °

## 2023-02-20 ENCOUNTER — Telehealth: Payer: Self-pay | Admitting: Pharmacy Technician

## 2023-02-20 ENCOUNTER — Other Ambulatory Visit (HOSPITAL_COMMUNITY): Payer: Self-pay

## 2023-02-20 NOTE — Telephone Encounter (Signed)
PA has been submitted EXPEDITED, and telephone encounter has been created.  

## 2023-02-20 NOTE — Telephone Encounter (Signed)
Patient Advocate Encounter  Received notification from [employer] that prior authorization for Bismuth/Metronidaz/Tetracyclin 140-125-125MG  is required.   PA submitted on 6.20.24 Key [id number]  Status is pending

## 2023-02-21 ENCOUNTER — Other Ambulatory Visit (HOSPITAL_COMMUNITY): Payer: Self-pay

## 2023-02-21 NOTE — Telephone Encounter (Signed)
Patient Advocate Encounter  Prior Authorization for Bismuth/Metronidaz/Tetracyclin 140-125-125MG   has been approved with [employer].    PA# [id number] Effective dates: 6.20.24 through 12.31.24  Per [facility] test claim, copay for 10 days supply is $0

## 2023-03-06 ENCOUNTER — Other Ambulatory Visit: Payer: Self-pay | Admitting: Internal Medicine

## 2023-03-12 NOTE — Progress Notes (Unsigned)
     03/12/2023 Angel Hebert [id number] Apr 14, 1956   Chief Complaint: Hospital follow up  History of Present Illness: Angel Hebert is a 67 year old female with a past medical history of hypertension, hyperlipidemia, CHF with LV  EF 45 - 50%, asthma, alcohol use disorder, anemia, osteoarthritis, GERD, diverticulosis and colon polyps. She is known by Dr. Lavon Paganini.   EGD 01/12/2023 as an inpatient by Dr. Rhea Belton: - LA Grade B esophagitis with no bleeding.  - 2 cm hiatal hernia.  - Gastritis. Biopsied, to exclude H. Pylori.  - Non-bleeding duodenal ulcers A. STOMACH, BIOPSY:  -  Antral and oxyntic mucosa with moderate chronic active gastritis.  Note: While immunohistochemical stain for Helicobacter pylori organisms  is negative, the overall morphologic stains are still concerning for  possible Helicobacter pylori infection.  Clinical correlation with  further testing as clinically indicated.   EGD 10/22/2022 as an inpatient by Dr. Orvan Falconer: - Normal esophagus.  - Normal stomach. Biopsied.  - Non-bleeding duodenal ulcer with no stigmata of bleeding.  - The examination was otherwise normal. - Gastric antral mucosa with chronic gastritis, see comment  Immunohistochemical stain for Helicobacter pylori is negative.   Colonoscopy 01/17/2016: - One 11 mm polyp in the cecum, removed with a hot snare. Resected and retrieved.  - One 4 mm polyp in the transverse colon, removed with a cold biopsy forceps. Resected and retrieved.  - One 7 mm polyp in the sigmoid colon, removed with a cold snare. Resected and retrieved.  - Diverticulosis in the sigmoid colon.  - Non-bleeding internal hemorrhoids. - 3 year recall colonoscopy  TUBULAR ADENOMA (X6 FRAGMENTS). - NO HIGH GRADE DYSPLASIA OR MALIGNANCY.  Current Medications, Allergies, Past Medical History, Past Surgical History, Family History and Social History were reviewed in [employer] record.   Review of  Systems:   Constitutional: Negative for fever, sweats, chills or weight loss.  Respiratory: Negative for shortness of breath.   Cardiovascular: Negative for chest pain, palpitations and leg swelling.  Gastrointestinal: See HPI.  Musculoskeletal: Negative for back pain or muscle aches.  Neurological: Negative for dizziness, headaches or paresthesias.    Physical Exam: There were no vitals taken for this visit. General: in no acute distress. Head: Normocephalic and atraumatic. Eyes: No scleral icterus. Conjunctiva pink . Ears: Normal auditory acuity. Mouth: Dentition intact. No ulcers or lesions.  Lungs: Clear throughout to auscultation. Heart: Regular rate and rhythm, no murmur. Abdomen: Soft, nontender and nondistended. No masses or hepatomegaly. Normal bowel sounds x 4 quadrants.  Rectal: Deferred.  Musculoskeletal: Symmetrical with no gross deformities. Extremities: No edema. Neurological: Alert oriented x 4. No focal deficits.  Psychological: Alert and cooperative. Normal mood and affect  Assessment and Recommendations: ***

## 2023-03-13 ENCOUNTER — Ambulatory Visit: Payer: 59 | Admitting: Nurse Practitioner

## 2023-03-31 ENCOUNTER — Other Ambulatory Visit: Payer: Self-pay | Admitting: Internal Medicine

## 2023-04-08 ENCOUNTER — Encounter: Payer: Self-pay | Admitting: Family Medicine

## 2023-04-08 ENCOUNTER — Ambulatory Visit: Payer: 59 | Attending: Family Medicine | Admitting: Family Medicine

## 2023-04-08 VITALS — BP 190/96 | HR 104 | Wt 106.2 lb

## 2023-04-08 DIAGNOSIS — K279 Peptic ulcer, site unspecified, unspecified as acute or chronic, without hemorrhage or perforation: Secondary | ICD-10-CM

## 2023-04-08 DIAGNOSIS — E876 Hypokalemia: Secondary | ICD-10-CM | POA: Diagnosis not present

## 2023-04-08 DIAGNOSIS — D508 Other iron deficiency anemias: Secondary | ICD-10-CM | POA: Diagnosis not present

## 2023-04-08 DIAGNOSIS — I10 Essential (primary) hypertension: Secondary | ICD-10-CM

## 2023-04-08 DIAGNOSIS — G4709 Other insomnia: Secondary | ICD-10-CM

## 2023-04-08 MED ORDER — HYDROXYZINE HCL 25 MG PO TABS
25.0000 mg | ORAL_TABLET | Freq: Every evening | ORAL | 2 refills | Status: DC | PRN
Start: 2023-04-08 — End: 2023-04-30

## 2023-04-08 MED ORDER — LOSARTAN POTASSIUM 100 MG PO TABS
100.0000 mg | ORAL_TABLET | Freq: Every day | ORAL | 1 refills | Status: DC
Start: 2023-04-08 — End: 2023-10-02

## 2023-04-08 NOTE — Patient Instructions (Signed)
Managing Your Hypertension Hypertension, also called high blood pressure, is when the force of the blood pressing against the walls of the arteries is too strong. Arteries are blood vessels that carry blood from your heart throughout your body. Hypertension forces the heart to work harder to pump blood and may cause the arteries to become narrow or stiff. Understanding blood pressure readings A blood pressure reading includes a higher number over a lower number: The first, or top, number is called the systolic pressure. It is a measure of the pressure in your arteries as your heart beats. The second, or bottom number, is called the diastolic pressure. It is a measure of the pressure in your arteries as the heart relaxes. For most people, a normal blood pressure is below 120/80. Your personal target blood pressure may vary depending on your medical conditions, your age, and other factors. Blood pressure is classified into four stages. Based on your blood pressure reading, your health care provider may use the following stages to determine what type of treatment you need, if any. Systolic pressure and diastolic pressure are measured in a unit called millimeters of mercury (mmHg). Normal Systolic pressure: below 120. Diastolic pressure: below 80. Elevated Systolic pressure: 120-129. Diastolic pressure: below 80. Hypertension stage 1 Systolic pressure: 130-139. Diastolic pressure: 80-89. Hypertension stage 2 Systolic pressure: 140 or above. Diastolic pressure: 90 or above. How can this condition affect me? Managing your hypertension is very important. Over time, hypertension can damage the arteries and decrease blood flow to parts of the body, including the brain, heart, and kidneys. Having untreated or uncontrolled hypertension can lead to: A heart attack. A stroke. A weakened blood vessel (aneurysm). Heart failure. Kidney damage. Eye damage. Memory and concentration problems. Vascular  dementia. What actions can I take to manage this condition? Hypertension can be managed by making lifestyle changes and possibly by taking medicines. Your health care provider will help you make a plan to bring your blood pressure within a normal range. You may be referred for counseling on a healthy diet and physical activity. Nutrition  Eat a diet that is high in fiber and potassium, and low in salt (sodium), added sugar, and fat. An example eating plan is called the DASH diet. DASH stands for Dietary Approaches to Stop Hypertension. To eat this way: Eat plenty of fresh fruits and vegetables. Try to fill one-half of your plate at each meal with fruits and vegetables. Eat whole grains, such as whole-wheat pasta, brown rice, or whole-grain bread. Fill about one-fourth of your plate with whole grains. Eat low-fat dairy products. Avoid fatty cuts of meat, processed or cured meats, and poultry with skin. Fill about one-fourth of your plate with lean proteins such as fish, chicken without skin, beans, eggs, and tofu. Avoid pre-made and processed foods. These tend to be higher in sodium, added sugar, and fat. Reduce your daily sodium intake. Many people with hypertension should eat less than 1,500 mg of sodium a day. Lifestyle  Work with your health care provider to maintain a healthy body weight or to lose weight. Ask what an ideal weight is for you. Get at least 30 minutes of exercise that causes your heart to beat faster (aerobic exercise) most days of the week. Activities may include walking, swimming, or biking. Include exercise to strengthen your muscles (resistance exercise), such as weight lifting, as part of your weekly exercise routine. Try to do these types of exercises for 30 minutes at least 3 days a week. Do   not use any products that contain nicotine or tobacco. These products include cigarettes, chewing tobacco, and vaping devices, such as e-cigarettes. If you need help quitting, ask your  health care provider. Control any long-term (chronic) conditions you have, such as high cholesterol or diabetes. Identify your sources of stress and find ways to manage stress. This may include meditation, deep breathing, or making time for fun activities. Alcohol use Do not drink alcohol if: Your health care provider tells you not to drink. You are pregnant, may be pregnant, or are planning to become pregnant. If you drink alcohol: Limit how much you have to: 0-1 drink a day for women. 0-2 drinks a day for men. Know how much alcohol is in your drink. In the [place] one drink equals one 12 oz bottle of beer (355 mL), one 5 oz glass of wine (148 mL), or one 1 oz glass of hard liquor (44 mL). Medicines Your health care provider may prescribe medicine if lifestyle changes are not enough to get your blood pressure under control and if: Your systolic blood pressure is 130 or higher. Your diastolic blood pressure is 80 or higher. Take medicines only as told by your health care provider. Follow the directions carefully. Blood pressure medicines must be taken as told by your health care provider. The medicine does not work as well when you skip doses. Skipping doses also puts you at risk for problems. Monitoring Before you monitor your blood pressure: Do not smoke, drink caffeinated beverages, or exercise within 30 minutes before taking a measurement. Use the bathroom and empty your bladder (urinate). Sit quietly for at least 5 minutes before taking measurements. Monitor your blood pressure at home as told by your health care provider. To do this: Sit with your back straight and supported. Place your feet flat on the floor. Do not cross your legs. Support your arm on a flat surface, such as a table. Make sure your upper arm is at heart level. Each time you measure, take two or three readings one minute apart and record the results. You may also need to have your blood pressure checked regularly by  your health care provider. General information Talk with your health care provider about your diet, exercise habits, and other lifestyle factors that may be contributing to hypertension. Review all the medicines you take with your health care provider because there may be side effects or interactions. Keep all follow-up visits. Your health care provider can help you create and adjust your plan for managing your high blood pressure. Where to find more information National Heart, Lung, and Blood Institute: www.nhlbi.nih.gov American Heart Association: www.heart.org Contact a health care provider if: You think you are having a reaction to medicines you have taken. You have repeated (recurrent) headaches. You feel dizzy. You have swelling in your ankles. You have trouble with your vision. Get help right away if: You develop a severe headache or confusion. You have unusual weakness or numbness, or you feel faint. You have severe pain in your chest or abdomen. You vomit repeatedly. You have trouble breathing. These symptoms may be an emergency. Get help right away. Call 911. Do not wait to see if the symptoms will go away. Do not drive yourself to the hospital. Summary Hypertension is when the force of blood pumping through your arteries is too strong. If this condition is not controlled, it may put you at risk for serious complications. Your personal target blood pressure may vary depending on your medical conditions,   your age, and other factors. For most people, a normal blood pressure is less than 120/80. Hypertension is managed by lifestyle changes, medicines, or both. Lifestyle changes to help manage hypertension include losing weight, eating a healthy, low-sodium diet, exercising more, stopping smoking, and limiting alcohol. This information is not intended to replace advice given to you by your health care provider. Make sure you discuss any questions you have with your health care  provider. Document Revised: 05/03/2021 Document Reviewed: 05/03/2021 Elsevier Patient Education  2024 [employer]  

## 2023-04-08 NOTE — Progress Notes (Signed)
Subjective:  Patient ID: Angel Hebert, female    DOB: [birth year]  Age: 67 y.o. MRN: [id number]  CC: Insomnia and stomach ulcer   HPI Angel Hebert is a 67 y.o. year old female with a history of hypertension, hyperlipidemia, asthma, allergic rhinitis, nasal polyps (Status post sinus surgery in 10/2016), GERD, bilateral knee osteoarthritis (status post left total knee arthroplasty in 09/2016), CHF (EF 45 to 50% from 2020) here for follow-up visit.  Last OV was in 08/2021  Interval History: Discussed the use of AI scribe software for clinical note transcription with the patient, who gave verbal consent to proceed.   She presents with difficulty sleeping for over a year. She has been using over-the-counter sleep aids and Tylenol to manage her symptoms. She also reports right knee pain, which is worse than her previously replaced left knee; under orthopedic care for this. The patient admits to drinking alcohol and soda, and also takes naps during the day.  She also reports stomach pain and endorses adherence with pantoprazole.     EGD from 01/2023 revealed: Impression: - LA Grade B esophagitis with no bleeding. - 2 cm hiatal hernia. - Gastritis. Biopsied, to exclude H. Pylori. - Non- bleeding duodenal ulcers.   Her blood pressure is significantly elevated and she states during her hospitalization her antihypertensive losartan/HCTZ was discontinued.  She currently does not take any antihypertensive.   Past Medical History:  Diagnosis Date   Arthritis    Asthma    last year in December   Dyspnea    GERD (gastroesophageal reflux disease)    High cholesterol    Hypertension    Pneumonia 01/2017    Past Surgical History:  Procedure Laterality Date   ABDOMINAL HYSTERECTOMY     BIOPSY  10/22/2022   Procedure: BIOPSY;  Surgeon: Tressia Danas, MD;  Location: [facility] ENDOSCOPY;  Service: Gastroenterology;;   BIOPSY  01/12/2023   Procedure: BIOPSY;  Surgeon: Beverley Fiedler, MD;   Location: [facility] ENDOSCOPY;  Service: Gastroenterology;;   ESOPHAGOGASTRODUODENOSCOPY (EGD) WITH PROPOFOL N/A 10/22/2022   Procedure: ESOPHAGOGASTRODUODENOSCOPY (EGD) WITH PROPOFOL;  Surgeon: Tressia Danas, MD;  Location: [facility] ENDOSCOPY;  Service: Gastroenterology;  Laterality: N/A;   ESOPHAGOGASTRODUODENOSCOPY (EGD) WITH PROPOFOL N/A 01/12/2023   Procedure: ESOPHAGOGASTRODUODENOSCOPY (EGD) WITH PROPOFOL;  Surgeon: Beverley Fiedler, MD;  Location: [facility] ENDOSCOPY;  Service: Gastroenterology;  Laterality: N/A;   FEMUR IM NAIL Left 04/17/2020   Procedure: INTRAMEDULLARY (IM) RETROGRADE FEMORAL NAILING;  Surgeon: Cammy Copa, MD;  Location: [facility] OR;  Service: Orthopedics;  Laterality: Left;   nasal polyps     removed just this past tuesday at Outpt facility [facility] by [facility]   TOTAL KNEE ARTHROPLASTY Left 10/01/2016   Procedure: LEFT TOTAL KNEE ARTHROPLASTY;  Surgeon: Cammy Copa, MD;  Location: [facility] OR;  Service: Orthopedics;  Laterality: Left;    Family History  Problem Relation Age of Onset   Diabetes Sister    Healthy Mother    Healthy Father    Colon cancer Neg Hx    Breast cancer Neg Hx     Social History   Socioeconomic History   Marital status: Single    Spouse name: Not on file   Number of children: Not on file   Years of education: Not on file   Highest education level: Not on file  Occupational History   Not on file  Tobacco Use   Smoking status: Never   Smokeless tobacco: Never  Vaping Use   Vaping status: Never  Used  Substance and Sexual Activity   Alcohol use: Yes    Alcohol/week: 1.0 standard drink of alcohol    Types: 1 Shots of liquor per week    Comment: socially   Drug use: No   Sexual activity: Not on file  Other Topics Concern   Not on file  Social History Narrative   Not on file   Social Determinants of Health   Financial Resource Strain: Not on file  Food Insecurity: No Food Insecurity (01/11/2023)   Hunger Vital Sign    Worried About Running  Out of Food in the Last Year: Never true    Ran Out of Food in the Last Year: Never true  Transportation Needs: Unmet Transportation Needs (01/11/2023)   PRAPARE - Administrator, Civil Service (Medical): Yes    Lack of Transportation (Non-Medical): No  Physical Activity: Not on file  Stress: Not on file  Social Connections: Not on file    No Known Allergies  Outpatient Medications Prior to Visit  Medication Sig Dispense Refill   albuterol (PROVENTIL) (2.5 MG/3ML) 0.083% nebulizer solution Take 3 mLs (2.5 mg total) by nebulization every 6 (six) hours as needed for wheezing or shortness of breath. 75 mL 3   albuterol (VENTOLIN HFA) 108 (90 Base) MCG/ACT inhaler TAKE 2 PUFFS BY MOUTH EVERY 6  HOURS AS NEEDED FOR WHEEZE OR  SHORTNESS OF BREATH 34 g 2   atorvastatin (LIPITOR) 20 MG tablet TAKE 1 TABLET BY MOUTH DAILY AT 6PM (Patient taking differently: Take 20 mg by mouth daily.) 90 tablet 0   Bismuth/Metronidaz/Tetracyclin 140-125-125 MG CAPS TAKE 3 CAPSULES BY MOUTH IN THE MORNING, AT NOON, IN THE EVENING, AND AT BEDTIME. 120 capsule 0   fluticasone (FLONASE) 50 MCG/ACT nasal spray Place 1 spray into both nostrils daily.     fluticasone-salmeterol (ADVAIR) 500-50 MCG/ACT AEPB INHALE ONE PUFF BY MOUTH INTO  THE LUNGS IN THE MORNING AND AT  BEDTIME 60 each 0   folic acid (FOLVITE) 1 MG tablet Take 1 tablet (1 mg total) by mouth daily. 30 tablet 0   HYDROcodone-acetaminophen (NORCO/VICODIN) 5-325 MG tablet Take 1 tablet by mouth every 6 (six) hours as needed for severe pain. 10 tablet 0   MAGnesium-Oxide 400 (240 Mg) MG tablet Take 1 tablet (400 mg total) by mouth daily. 5 tablet 0   pantoprazole (PROTONIX) 40 MG tablet Take 1 tablet (40 mg total) by mouth 2 (two) times daily. 60 tablet 2   pantoprazole (PROTONIX) 40 MG tablet TAKE 1 TABLET (40 MG TOTAL) BY MOUTH TWICE A DAY BEFORE MEALS 60 tablet 0   potassium chloride SA (KLOR-CON M) 20 MEQ tablet Take 2 tablets (40 mEq total) by  mouth daily. 2 tablet 0   thiamine (VITAMIN B1) 100 MG tablet Take 1 tablet (100 mg total) by mouth daily. 30 tablet 0   No facility-administered medications prior to visit.     ROS Review of Systems  Constitutional:  Negative for activity change and appetite change.  HENT:  Negative for sinus pressure and sore throat.   Respiratory:  Negative for chest tightness, shortness of breath and wheezing.   Cardiovascular:  Negative for chest pain and palpitations.  Gastrointestinal:  Negative for abdominal distention, abdominal pain and constipation.  Genitourinary: Negative.   Musculoskeletal:        See HPI  Psychiatric/Behavioral:  Positive for sleep disturbance. Negative for behavioral problems and dysphoric mood.     Objective:  BP (!) 190/96  Pulse (!) 104   Wt 106 lb 3.7 oz (48.2 kg)   SpO2 98%   BMI 17.15 kg/m      04/08/2023    4:51 PM 04/08/2023    4:17 PM 01/13/2023    9:12 AM  BP/Weight  Systolic BP 190 198 103  Diastolic BP 96 102 60  Wt. (Lbs)  106.23   BMI  17.15 kg/m2       Physical Exam Constitutional:      Appearance: She is well-developed.  Cardiovascular:     Rate and Rhythm: Normal rate.     Heart sounds: Normal heart sounds. No murmur heard. Pulmonary:     Effort: Pulmonary effort is normal.     Breath sounds: Normal breath sounds. No wheezing or rales.  Chest:     Chest wall: No tenderness.  Abdominal:     General: Bowel sounds are normal. There is no distension.     Palpations: Abdomen is soft. There is no mass.     Tenderness: There is no abdominal tenderness.  Musculoskeletal:        General: Normal range of motion.     Right lower leg: No edema.     Left lower leg: No edema.  Neurological:     Mental Status: She is alert and oriented to person, place, and time.  Psychiatric:        Mood and Affect: Mood normal.        Latest Ref Rng & Units 01/13/2023    7:52 AM 01/12/2023    3:15 AM 01/11/2023    3:19 PM  CMP  Glucose 70 - 99  mg/dL 416  95  606   BUN 8 - 23 mg/dL 9  35  49   Creatinine 0.44 - 1.00 mg/dL 3.01  6.01  0.93   Sodium 135 - 145 mmol/L 132  131  129   Potassium 3.5 - 5.1 mmol/L 3.0  4.2  3.2   Chloride 98 - 111 mmol/L 94  97  94   CO2 22 - 32 mmol/L 26  24    Calcium 8.9 - 10.3 mg/dL 9.1  8.5    Total Protein 6.5 - 8.1 g/dL  5.5    Total Bilirubin 0.3 - 1.2 mg/dL  1.0    Alkaline Phos 38 - 126 U/L  58    AST 15 - 41 U/L  22    ALT 0 - 44 U/L  11      Lipid Panel     Component Value Date/Time   CHOL 226 (H) 06/11/2021 1037   TRIG 87 06/11/2021 1037   HDL 108 06/11/2021 1037   CHOLHDL 2.1 06/11/2021 1037   CHOLHDL 2.5 05/20/2016 1452   VLDL 26 05/20/2016 1452   LDLCALC 103 (H) 06/11/2021 1037    CBC    Component Value Date/Time   WBC 6.5 01/13/2023 0752   RBC 2.47 (L) 01/13/2023 0752   HGB 8.4 (L) 01/13/2023 0752   HGB 9.9 (L) 08/22/2021 1406   HCT 24.6 (L) 01/13/2023 0752   HCT 29.4 (L) 08/22/2021 1406   PLT 241 01/13/2023 0752   PLT 310 08/22/2021 1406   MCV 99.6 01/13/2023 0752   MCV 96 08/22/2021 1406   MCH 34.0 01/13/2023 0752   MCHC 34.1 01/13/2023 0752   RDW 17.7 (H) 01/13/2023 0752   RDW 14.8 08/22/2021 1406   LYMPHSABS 1.4 10/21/2022 0333   LYMPHSABS 2.2 08/22/2021 1406   MONOABS 0.5 10/21/2022 0333  EOSABS 0.0 10/21/2022 0333   EOSABS 0.1 08/22/2021 1406   BASOSABS 0.0 10/21/2022 0333   BASOSABS 0.0 08/22/2021 1406    Lab Results  Component Value Date   HGBA1C 5.5 07/25/2021    Assessment & Plan:      Insomnia Chronic difficulty sleeping for over a year. Contributing factors include daytime napping, late evening caffeine intake, and screen time before bed. -Counseled on sleep hygiene -Advised to avoid daytime napping, limit evening caffeine intake, and reduce screen time before bed. -Prescribe Hydroxyzine for sleep aid.  Peptic ulcers On Pantoprazole with about eight pills left. 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 -Advised to limit alcohol intake due to its contribution to gastric inflammation and ulcer formation.  Hypertension Elevated blood pressure noted during visit. Previously on Losartan/Hydrochlorothiazide, but discontinued due to concerns about low potassium during a previous hospitalization -Resume Losartan without Hydrochlorothiazide. -Check potassium levels today and recheck blood pressure in one month.  Right Knee Arthritis  Patient has had a left knee replacement in the past. -Recommend follow-up with Dr. August Saucer for further management.      Hypokalemia Current not on potassium supplementation -Will check potassium and adjust regimen accordingly  Anemia Hemoglobin of 8.4 from 01/2023 -This probably occurred in the setting of GI losses from gastritis -Will check CBC again today    Meds ordered this encounter  Medications   hydrOXYzine (ATARAX) 25 MG tablet    Sig: Take 1 tablet (25 mg total) by mouth at bedtime as needed.    Dispense:  30 tablet    Refill:  2   losartan (COZAAR) 100 MG tablet    Sig: Take 1 tablet (100 mg total) by mouth daily.    Dispense:  90 tablet    Refill:  1    Follow-up: Return in about 1 month (around 05/09/2023) for Blood Pressure follow-up with PCP.       [name], MD, FAAFP. [facility] [place], [place] [phone]   04/08/2023, 5:50 PM

## 2023-04-24 ENCOUNTER — Ambulatory Visit: Payer: Self-pay | Admitting: *Deleted

## 2023-04-24 NOTE — Telephone Encounter (Signed)
Agent transferred [facility], on DPR over for lab results however no one was on the line. I called her back at [phone] got a recording that the call could not be completed at this time to try the call again later.

## 2023-04-24 NOTE — Telephone Encounter (Signed)
Angel Hebert called back. Share providers' note. No questions.  [name], MD 04/09/2023  8:58 AM EDT     Please inform her that blood work is normal and she is not anemic.  Potassium is normal and she does not need to take potassium supplements.  Thanks

## 2023-04-30 ENCOUNTER — Other Ambulatory Visit: Payer: Self-pay | Admitting: Family Medicine

## 2023-04-30 DIAGNOSIS — G4709 Other insomnia: Secondary | ICD-10-CM

## 2023-05-20 ENCOUNTER — Ambulatory Visit: Payer: 59 | Admitting: Family Medicine

## 2023-07-28 ENCOUNTER — Other Ambulatory Visit: Payer: Self-pay | Admitting: Family Medicine

## 2023-07-28 NOTE — Telephone Encounter (Signed)
Medication Refill -  Most Recent Primary Care Visit:  Provider: [name]  Department: [facility]  Visit Type: OFFICE VISIT  Date: 04/08/2023  Medication: albuterol (VENTOLIN HFA) 108 (90 Base) MCG/ACT inhaler   Has the patient contacted their pharmacy? Yes (Agent: If no, request that the patient contact the pharmacy for the refill. If patient does not wish to contact the pharmacy document the reason why and proceed with request.) (Agent: If yes, when and what did the pharmacy advise?)  Is this the correct pharmacy for this prescription? No If no, delete pharmacy and type the correct one.  This is the patient's preferred pharmacy: [facility] [phone] [place], [place] - [address] [place] [address] Phone: [phone] Fax: [phone]  Has the prescription been filled recently? No  Is the patient out of the medication? Yes  Has the patient been seen for an appointment in the last year OR does the patient have an upcoming appointment? Yes  Can we respond through MyChart? No  Agent: Please be advised that Rx refills may take up to 3 business days. We ask that you follow-up with your pharmacy.

## 2023-07-29 MED ORDER — ALBUTEROL SULFATE HFA 108 (90 BASE) MCG/ACT IN AERS: 2.0000 | INHALATION_SPRAY | Freq: Four times a day (QID) | RESPIRATORY_TRACT | 2 refills | Status: DC | PRN

## 2023-07-29 NOTE — Telephone Encounter (Signed)
Requested Prescriptions  Pending Prescriptions Disp Refills   albuterol (VENTOLIN HFA) 108 (90 Base) MCG/ACT inhaler 34 g 2    Sig: Inhale 2 puffs into the lungs every 6 (six) hours as needed for wheezing or shortness of breath.     Pulmonology:  Beta Agonists 2 Failed - 07/28/2023  1:56 PM      Failed - Last BP in normal range    BP Readings from Last 1 Encounters:  04/08/23 (!) 190/96         Passed - Last Heart Rate in normal range    Pulse Readings from Last 1 Encounters:  04/08/23 (!) 104         Passed - Valid encounter within last 12 months    Recent Outpatient Visits           3 months ago Other insomnia   [facility] - A Dept Of [place]. [facility] [name], MD   1 year ago Hypokalemia   [facility] - A Dept Of [place]. [facility], [place]ella [place], [place]   2 years ago Melena   [facility] - A Dept Of [place]. [facility] [name], MD   3 years ago Pure hypercholesterolemia   [facility] [place] - A Dept Of [place]. [facility] [name], MD   3 years ago Hypertensive heart disease with chronic combined systolic and diastolic congestive heart failure [facility])   [facility] - A Dept Of [place]. [facility] [name], MD       Future Appointments             In 2 months [name], MD [facility] [place] - A Dept Of [place]. [facility]

## 2023-10-02 ENCOUNTER — Other Ambulatory Visit: Payer: Self-pay | Admitting: Family Medicine

## 2023-10-02 DIAGNOSIS — I10 Essential (primary) hypertension: Secondary | ICD-10-CM

## 2023-10-02 NOTE — Telephone Encounter (Signed)
Requested medication (s) are due for refill today: yes   Requested medication (s) are on the active medication list: yes   Last refill:  04/08/23 #90 1 refills  Future visit scheduled: yes in 5 days   Notes to clinic:  protocol failed last labs 01/13/23. Do you want to refill Rx?      Requested Prescriptions  Pending Prescriptions Disp Refills   losartan (COZAAR) 100 MG tablet [Pharmacy Med Name: LOSARTAN POTASSIUM 100 MG TAB] 90 tablet 1    Sig: TAKE 1 TABLET BY MOUTH EVERY DAY     Cardiovascular:  Angiotensin Receptor Blockers Failed - 10/02/2023 11:50 AM      Failed - Cr in normal range and within 180 days    Creat  Date Value Ref Range Status  05/20/2016 0.55 0.50 - 1.05 mg/dL Final    Comment:      For patients > or = 67 years of age: The upper reference limit for Creatinine is approximately 13% higher for people identified as African-American.      Creatinine, Ser  Date Value Ref Range Status  01/13/2023 0.72 0.44 - 1.00 mg/dL Final         Failed - Last BP in normal range    BP Readings from Last 1 Encounters:  04/08/23 (!) 190/96         Passed - K in normal range and within 180 days    Potassium  Date Value Ref Range Status  04/08/2023 3.6 3.5 - 5.2 mmol/L Final         Passed - Patient is not pregnant      Passed - Valid encounter within last 6 months    Recent Outpatient Visits           5 months ago Other insomnia   [facility] - [facility] Dept Of [place]. [facility] [name], MD   2 years ago Hypokalemia   [facility] [place] - A Dept Of [place]. [facility] [place], [place]zella [place], [place]   2 years ago Melena   [facility] [place] - A Dept Of [place]. [facility] [name], MD   3 years ago Pure hypercholesterolemia   [facility] [place] - A Dept Of [place]. [facility] [name], MD   4 years ago Hypertensive heart disease with chronic  combined systolic and diastolic congestive heart failure [facility])   [facility] - A Dept Of [place]. [facility] [name], MD       Future Appointments             In 5 days [name], MD [facility] [place] - A Dept Of [place]. [facility]

## 2023-10-04 DIAGNOSIS — J4540 Moderate persistent asthma, uncomplicated: Secondary | ICD-10-CM | POA: Diagnosis not present

## 2023-10-07 ENCOUNTER — Other Ambulatory Visit: Payer: Self-pay

## 2023-10-07 ENCOUNTER — Emergency Department (HOSPITAL_COMMUNITY)
Admission: EM | Admit: 2023-10-07 | Discharge: 2023-10-08 | Disposition: A | Discharge status: Home / Self Care | Payer: 59 | Attending: Emergency Medicine | Admitting: Emergency Medicine

## 2023-10-07 ENCOUNTER — Ambulatory Visit: Payer: 59 | Admitting: Family Medicine

## 2023-10-07 ENCOUNTER — Encounter (HOSPITAL_COMMUNITY): Payer: Self-pay

## 2023-10-07 ENCOUNTER — Emergency Department (HOSPITAL_COMMUNITY): Payer: 59

## 2023-10-07 DIAGNOSIS — R051 Acute cough: Secondary | ICD-10-CM

## 2023-10-07 DIAGNOSIS — J45909 Unspecified asthma, uncomplicated: Secondary | ICD-10-CM | POA: Diagnosis not present

## 2023-10-07 DIAGNOSIS — G4709 Other insomnia: Secondary | ICD-10-CM

## 2023-10-07 DIAGNOSIS — J45901 Unspecified asthma with (acute) exacerbation: Secondary | ICD-10-CM | POA: Insufficient documentation

## 2023-10-07 DIAGNOSIS — Z20822 Contact with and (suspected) exposure to covid-19: Secondary | ICD-10-CM | POA: Insufficient documentation

## 2023-10-07 DIAGNOSIS — F17210 Nicotine dependence, cigarettes, uncomplicated: Secondary | ICD-10-CM | POA: Diagnosis not present

## 2023-10-07 DIAGNOSIS — I10 Essential (primary) hypertension: Secondary | ICD-10-CM

## 2023-10-07 DIAGNOSIS — R059 Cough, unspecified: Secondary | ICD-10-CM | POA: Diagnosis not present

## 2023-10-07 DIAGNOSIS — R0602 Shortness of breath: Secondary | ICD-10-CM | POA: Diagnosis not present

## 2023-10-07 DIAGNOSIS — I700 Atherosclerosis of aorta: Secondary | ICD-10-CM | POA: Diagnosis not present

## 2023-10-07 DIAGNOSIS — R918 Other nonspecific abnormal finding of lung field: Secondary | ICD-10-CM | POA: Diagnosis not present

## 2023-10-07 LAB — RESP PANEL BY RT-PCR (RSV, FLU A&B, COVID)  RVPGX2
Influenza A by PCR: NEGATIVE
Influenza B by PCR: NEGATIVE
Resp Syncytial Virus by PCR: NEGATIVE
SARS Coronavirus 2 by RT PCR: NEGATIVE

## 2023-10-07 NOTE — ED Triage Notes (Signed)
Pt came for a cough that started a week ago. Hx of asthma. Pt states she was a smoking a cigarette and cough got worse. Denies CP and SHOB.

## 2023-10-07 NOTE — ED Provider Triage Note (Signed)
 Emergency Medicine Provider Triage Evaluation Note  Shatima Zalar , a 68 y.o. female  was evaluated in triage.  Pt complains of dry cough ongoing for the past week.  Denies any shortness of breath or chest pain.  Reports she has a history of asthma.  Denies any other symptoms like fevers, chills, sore throat, abdominal pain.  Review of Systems  Positive: As above Negative: As above  Physical Exam  BP (!) 176/96   Pulse 100   Temp 99 F (37.2 C) (Oral)   Resp 18   Ht 5' 7 (1.702 m)   Wt 54.4 kg   SpO2 96%   BMI 18.79 kg/m  Gen:   Awake, no distress   Resp:  Normal effort  MSK:   Moves extremities without difficulty    Medical Decision Making  Medically screening exam initiated at 5:59 PM.  Appropriate orders placed.  Donyea Gafford was informed that the remainder of the evaluation will be completed by another provider, this initial triage assessment does not replace that evaluation, and the importance of remaining in the ED until their evaluation is complete.     Veta Palma, PA-C 10/07/23 1759

## 2023-10-08 DIAGNOSIS — J45901 Unspecified asthma with (acute) exacerbation: Secondary | ICD-10-CM | POA: Diagnosis not present

## 2023-10-08 MED ORDER — HYDROXYZINE HCL 25 MG PO TABS
25.0000 mg | ORAL_TABLET | Freq: Every evening | ORAL | 0 refills | Status: DC | PRN
Start: 2023-10-08 — End: 2024-05-28

## 2023-10-08 MED ORDER — LOSARTAN POTASSIUM 100 MG PO TABS
100.0000 mg | ORAL_TABLET | Freq: Every day | ORAL | 0 refills | Status: DC
Start: 2023-10-08 — End: 2023-10-31

## 2023-10-08 MED ORDER — BENZONATATE 100 MG PO CAPS: 100.0000 mg | ORAL_CAPSULE | Freq: Three times a day (TID) | ORAL | 0 refills | Status: DC

## 2023-10-08 MED ORDER — ALBUTEROL SULFATE HFA 108 (90 BASE) MCG/ACT IN AERS
2.0000 | INHALATION_SPRAY | RESPIRATORY_TRACT | Status: DC
  Administered 2023-10-08: 2 via RESPIRATORY_TRACT
  Filled 2023-10-08: qty 6.7

## 2023-10-08 NOTE — ED Provider Notes (Signed)
 [place] EMERGENCY DEPARTMENT AT [facility] Provider Note   CSN: [id number] Arrival date & time: 10/07/23  1454     History  Chief Complaint  Patient presents with   Cough    Angel Hebert is a 68 y.o. female.  With a history of asthma who presents to the ED for coughing.  1 week of ongoing coughing.  Still smoking.  No chest pain shortness of breath nausea vomiting diarrhea fevers or chills.   Cough      Home Medications Prior to Admission medications   Medication Sig Start Date End Date Taking? Authorizing Provider  benzonatate  (TESSALON ) 100 MG capsule Take 1 capsule (100 mg total) by mouth every 8 (eight) hours. 10/08/23  Yes Pamella Ozell LABOR, DO  albuterol  (PROVENTIL ) (2.5 MG/3ML) 0.083% nebulizer solution Take 3 mLs (2.5 mg total) by nebulization every 6 (six) hours as needed for wheezing or shortness of breath. 08/16/20   Newlin, Enobong, MD  albuterol  (VENTOLIN  HFA) 108 (90 Base) MCG/ACT inhaler Inhale 2 puffs into the lungs every 6 (six) hours as needed for wheezing or shortness of breath. 07/29/23   Newlin, Enobong, MD  atorvastatin  (LIPITOR) 20 MG tablet TAKE 1 TABLET BY MOUTH DAILY AT 6PM Patient taking differently: Take 20 mg by mouth daily. 08/07/22   Newlin, Enobong, MD  Bismuth /Metronidaz/Tetracyclin [phone] MG CAPS TAKE 3 CAPSULES BY MOUTH IN THE MORNING, AT NOON, IN THE EVENING, AND AT BEDTIME. 02/20/23   Pyrtle, Gordy HERO, MD  fluticasone  (FLONASE ) 50 MCG/ACT nasal spray Place 1 spray into both nostrils daily.    [provider]  fluticasone -salmeterol (ADVAIR) 500-50 MCG/ACT AEPB INHALE ONE PUFF BY MOUTH INTO  THE LUNGS IN THE MORNING AND AT  BEDTIME 01/28/23   Newlin, Enobong, MD  folic acid  (FOLVITE ) 1 MG tablet Take 1 tablet (1 mg total) by mouth daily. 12/12/22   Newlin, Enobong, MD  HYDROcodone -acetaminophen  (NORCO/VICODIN) 5-325 MG tablet Take 1 tablet by mouth every 6 (six) hours as needed for severe pain. 10/25/22   Elgergawy, Brayton RAMAN, MD  hydrOXYzine  (ATARAX ) 25 MG tablet Take 1 tablet (25 mg total) by mouth at bedtime as needed. 10/08/23   Pamella Ozell LABOR, DO  losartan  (COZAAR ) 100 MG tablet Take 1 tablet (100 mg total) by mouth daily. 10/08/23   Pamella Ozell LABOR, DO  MAGnesium -Oxide 400 (240 Mg) MG tablet Take 1 tablet (400 mg total) by mouth daily. 01/13/23   Ghimire, Donalda HERO, MD  pantoprazole  (PROTONIX ) 40 MG tablet Take 1 tablet (40 mg total) by mouth 2 (two) times daily. 01/13/23   Ghimire, Donalda HERO, MD  pantoprazole  (PROTONIX ) 40 MG tablet TAKE 1 TABLET (40 MG TOTAL) BY MOUTH TWICE A DAY BEFORE MEALS 03/07/23   Pyrtle, Gordy HERO, MD  potassium chloride  SA (KLOR-CON  M) 20 MEQ tablet Take 2 tablets (40 mEq total) by mouth daily. 01/13/23   Ghimire, Donalda HERO, MD  thiamine  (VITAMIN B1) 100 MG tablet Take 1 tablet (100 mg total) by mouth daily. 12/12/22   Newlin, Enobong, MD  traZODone  (DESYREL ) 50 MG tablet Take 1 tablet (50 mg total) by mouth at bedtime as needed for sleep. 07/21/19 09/16/19  Cheryle Page, MD      Allergies    Patient has no known allergies.    Review of Systems   Review of Systems  Respiratory:  Positive for cough.     Physical Exam Updated Vital Signs BP (!) 187/110 (BP Location: Right Arm)   Pulse 99  Temp 98.3 F (36.8 C) (Oral)   Resp 18   Ht 5' 7 (1.702 m)   Wt 54.4 kg   SpO2 98%   BMI 18.79 kg/m  Physical Exam Vitals and nursing note reviewed.  HENT:     Head: Normocephalic and atraumatic.  Eyes:     Pupils: Pupils are equal, round, and reactive to light.  Cardiovascular:     Rate and Rhythm: Normal rate and regular rhythm.  Pulmonary:     Effort: Pulmonary effort is normal.     Breath sounds: Normal breath sounds.  Abdominal:     Palpations: Abdomen is soft.     Tenderness: There is no abdominal tenderness.  Skin:    General: Skin is warm and dry.  Neurological:     Mental Status: She is alert.  Psychiatric:        Mood and Affect: Mood normal.     ED Results /  Procedures / Treatments   Labs (all labs ordered are listed, but only abnormal results are displayed) Labs Reviewed  RESP PANEL BY RT-PCR (RSV, FLU A&B, COVID)  [id number]    EKG None  Radiology DG Chest 2 View Result Date: 10/07/2023 CLINICAL DATA:  Cough.  Shortness of breath. EXAM: CHEST - 2 VIEW COMPARISON:  10/21/2022 FINDINGS: Heart size is normal. There is aortic atherosclerosis. Mild chronic interstitial lung markings. There is bronchial thickening but no consolidation, collapse or effusion. No acute bone finding. IMPRESSION: Bronchial thickening. No consolidation, collapse or effusion. Aortic atherosclerosis. Electronically Signed   By: Oneil Officer M.D.   On: 10/07/2023 18:30    Procedures Procedures    Medications Ordered in ED Medications  albuterol  (VENTOLIN  HFA) 108 (90 Base) MCG/ACT inhaler 2 puff (has no administration in time range)    ED Course/ Medical Decision Making/ A&P                                 Medical Decision Making 68 year old female here for coughing.  Cough has been dry.  Afebrile.  Has not had her blood pressure medication (losartan ) in the last month.  No albuterol  at home.  No wheezing or respiratory distress on exam.  Denies chest pain shortness of breath.  Chest x-ray shows no pneumonia and RSV/COVID/influenza all negative.  Will give her albuterol  MDI here and refill antihypertensive medications.  She will follow-up with PCP.  Risk Prescription drug management.           Final Clinical Impression(s) / ED Diagnoses Final diagnoses:  Acute cough  Exacerbation of asthma, unspecified asthma severity, unspecified whether persistent    Rx / DC Orders ED Discharge Orders          Ordered    losartan  (COZAAR ) 100 MG tablet  Daily       Note to Pharmacy: Must have office visit for refills   10/08/23 0906    hydrOXYzine  (ATARAX ) 25 MG tablet  At bedtime PRN        10/08/23 0906    benzonatate  (TESSALON ) 100 MG capsule  Every 8 hours         10/08/23 0911              Pamella Ozell LABOR, DO 10/08/23 [phone]

## 2023-10-08 NOTE — ED Notes (Signed)
 Pt and family member given [place] sandwich and water .

## 2023-10-08 NOTE — Discharge Instructions (Signed)
 You were seen in the Emergency Department for cough Your COVID RSV and influenza test were all negative Your chest x-ray looked okay We gave you an albuterol  inhaler We called in prescription for refills of your blood pressure medications, hydroxyzine  and Tessalon  Perles to help with the cough Follow-up with your primary care doctor in 1 week for reevaluation Return to the emergency department for trouble breathing or any other concerns

## 2023-10-08 NOTE — ED Notes (Signed)
 Pt seen in peds triage for fast track.

## 2023-10-09 ENCOUNTER — Other Ambulatory Visit (HOSPITAL_COMMUNITY): Payer: Self-pay

## 2023-10-22 ENCOUNTER — Telehealth: Payer: Self-pay | Admitting: Family Medicine

## 2023-10-22 NOTE — Telephone Encounter (Signed)
Copied from CRM [id number]. Topic: General - Other >> Oct 22, 2023  3:35 PM Antony Haste wrote: Reason for CRM: PT is wanting an order sent in for a nebulizer machine to help aid her breathing. She is wanting to speak more about this with her PCP or a nurse.  Callback [phone]

## 2023-10-23 NOTE — Telephone Encounter (Signed)
Patient was called and VM was left informing patient that she has upcoming appointment and she can discuss concerns then.

## 2023-10-29 ENCOUNTER — Inpatient Hospital Stay: Payer: 59 | Admitting: Physician Assistant

## 2023-10-30 ENCOUNTER — Inpatient Hospital Stay: Payer: 59 | Admitting: Family Medicine

## 2023-10-31 ENCOUNTER — Encounter: Payer: Self-pay | Admitting: Internal Medicine

## 2023-10-31 ENCOUNTER — Ambulatory Visit: Payer: 59 | Attending: Internal Medicine | Admitting: Internal Medicine

## 2023-10-31 VITALS — BP 150/80 | HR 90 | Ht 67.0 in | Wt 114.0 lb

## 2023-10-31 DIAGNOSIS — J4540 Moderate persistent asthma, uncomplicated: Secondary | ICD-10-CM | POA: Diagnosis not present

## 2023-10-31 DIAGNOSIS — R059 Cough, unspecified: Secondary | ICD-10-CM | POA: Diagnosis not present

## 2023-10-31 DIAGNOSIS — Z23 Encounter for immunization: Secondary | ICD-10-CM | POA: Diagnosis not present

## 2023-10-31 DIAGNOSIS — Z748 Other problems related to care provider dependency: Secondary | ICD-10-CM | POA: Diagnosis not present

## 2023-10-31 DIAGNOSIS — I10 Essential (primary) hypertension: Secondary | ICD-10-CM

## 2023-10-31 MED ORDER — AMLODIPINE BESYLATE 5 MG PO TABS: 5.0000 mg | ORAL_TABLET | Freq: Every day | ORAL | 0 refills | Status: DC

## 2023-10-31 MED ORDER — FLUTICASONE-SALMETEROL 100-50 MCG/ACT IN AEPB: 1.0000 | INHALATION_SPRAY | Freq: Two times a day (BID) | RESPIRATORY_TRACT | 3 refills | Status: DC

## 2023-10-31 MED ORDER — LOSARTAN POTASSIUM 100 MG PO TABS
100.0000 mg | ORAL_TABLET | Freq: Every day | ORAL | 0 refills | Status: DC
Start: 2023-10-31 — End: 2024-02-26

## 2023-10-31 MED ORDER — ALBUTEROL SULFATE HFA 108 (90 BASE) MCG/ACT IN AERS: 2.0000 | INHALATION_SPRAY | Freq: Four times a day (QID) | RESPIRATORY_TRACT | 2 refills | Status: DC | PRN

## 2023-10-31 NOTE — Patient Instructions (Addendum)
 Your blood pressure is not at goal.  Continue losartan 100 mg daily.  We have added another blood pressure medicine called amlodipine 5 mg daily.  Your asthma is not well-controlled.  I have sent a refill on albuterol inhaler.  We have added an inhaler that you were on in the past called Advair.  You should use this inhaler twice a day.  Please rinse your mouth out after each use.  Prescription will be sent to a medical supplier known as [facility] for your nebulizer machine.

## 2023-10-31 NOTE — Progress Notes (Signed)
 Patient ID: Angel Hebert, female    DOB: 1956-05-05  MRN: [id number]  CC: Follow-up (ER f/u. Med refill. Ottis Stain on R leg - fluid filled /Requesting nebulizer machine - experiencing cough /Requesting SCAT transportation/Yes to flu vax. Discuss pneumonia & Tdap vax)   Subjective: Angel Hebert is a 68 y.o. female who presents for ER f/u.  BF, Jonah, is with her Her concerns today include:  Pt with hx of HTN, HL, asthma, GERD, OA knees, CHFrEF, anemia  Patient was seen in the emergency room on 10/08/2023 with cough x 1 week.  Blood pressure was also noted to be elevated at 187/110.  Screening for flu/RSV/COVID was negative.  Chest x-ray revealed no acute findings.  Given albuterol metered-dose inhaler and refill on her blood pressure medication Cozaar 100 mg.  Told to follow-up.  Pt reports cough some better with Albuterol.  Using it 3x/day.Endorses SOB with the cough.  Not sure of wheezing but her BF says she does at nights.   Dry cough.  Endorses drainage at back throat, no nasal congestion.  No itchy throat.  Hx of asthma and suppose to be on Advair and Albuterol.  Looks like she has been out of Advair for some time. Had a neb machine but it was placed in storage 2-3 yrs ago; she loss the storage building due to non-payment.  Request new nebulizer machine -pt does not smoker but her BF does -Yes to flu shot today. Does not want PCV vaccine Missed appt with Dr. Alvis Lemmings yesterday but no show because of lack of transportation.   Request SCAT.  Blood pressure today is noted to be elevated.  She took the Cozaar already for today. No device to check BP. 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   Duodenal ulcer 10/22/2022   Acute GI bleeding 10/21/2022   GI bleed 10/21/2022   Protein-calorie malnutrition, severe (HCC) 10/21/2022   Acute on chronic blood loss anemia 10/21/2022   Hyponatremia 10/21/2022    Hypomagnesemia 10/21/2022   Hyperphosphatemia 10/21/2022   AKI (acute kidney injury) (HCC)    Alcohol induced acute pancreatitis 07/26/2021   Gastritis 07/25/2021   Periprosthetic fracture around internal prosthetic left knee joint 04/17/2020   Closed fracture of left distal femur (HCC) 04/16/2020   Symptomatic anemia 07/12/2019   Hypokalemia 07/12/2019   Lobar pneumonia (HCC) 07/12/2019   Insomnia 03/03/2017   History of total knee arthroplasty, left 11/27/2016   Arthritis of knee 10/01/2016   Chronic pansinusitis 09/05/2016   Physical deconditioning    Anemia, iron deficiency    Pressure ulcer 10/14/2015   CAP (community acquired pneumonia)    Sepsis due to Streptococcus pneumoniae (HCC)    Hyperlipidemia 03/02/2007   Essential hypertension 03/02/2007   Allergic rhinitis 03/02/2007   Asthma 03/02/2007   GERD 03/02/2007     Current Outpatient Medications on File Prior to Visit  Medication Sig Dispense Refill   albuterol (PROVENTIL) (2.5 MG/3ML) 0.083% nebulizer solution Take 3 mLs (2.5 mg total) by nebulization every 6 (six) hours as needed for wheezing or shortness of breath. 75 mL 3   atorvastatin (LIPITOR) 20 MG tablet TAKE 1 TABLET BY MOUTH DAILY AT 6PM (Patient taking differently: Take 20 mg by mouth daily.) 90 tablet 0   benzonatate (TESSALON) 100 MG capsule Take 1 capsule (100 mg total) by mouth every 8 (eight) hours. 21 capsule 0   Bismuth/Metronidaz/Tetracyclin 140-125-125 MG CAPS TAKE 3 CAPSULES  BY MOUTH IN THE MORNING, AT NOON, IN THE EVENING, AND AT BEDTIME. 120 capsule 0   fluticasone (FLONASE) 50 MCG/ACT nasal spray Place 1 spray into both nostrils daily.     fluticasone-salmeterol (ADVAIR) 500-50 MCG/ACT AEPB INHALE ONE PUFF BY MOUTH INTO  THE LUNGS IN THE MORNING AND AT  BEDTIME 60 each 0   folic acid (FOLVITE) 1 MG tablet Take 1 tablet (1 mg total) by mouth daily. 30 tablet 0   HYDROcodone-acetaminophen (NORCO/VICODIN) 5-325 MG tablet Take 1 tablet by mouth every  6 (six) hours as needed for severe pain. 10 tablet 0   hydrOXYzine (ATARAX) 25 MG tablet Take 1 tablet (25 mg total) by mouth at bedtime as needed. 90 tablet 0   MAGnesium-Oxide 400 (240 Mg) MG tablet Take 1 tablet (400 mg total) by mouth daily. 5 tablet 0   pantoprazole (PROTONIX) 40 MG tablet Take 1 tablet (40 mg total) by mouth 2 (two) times daily. 60 tablet 2   pantoprazole (PROTONIX) 40 MG tablet TAKE 1 TABLET (40 MG TOTAL) BY MOUTH TWICE A DAY BEFORE MEALS 60 tablet 0   potassium chloride SA (KLOR-CON M) 20 MEQ tablet Take 2 tablets (40 mEq total) by mouth daily. 2 tablet 0   thiamine (VITAMIN B1) 100 MG tablet Take 1 tablet (100 mg total) by mouth daily. 30 tablet 0   [DISCONTINUED] traZODone (DESYREL) 50 MG tablet Take 1 tablet (50 mg total) by mouth at bedtime as needed for sleep.     No current facility-administered medications on file prior to visit.    No Known Allergies  Social History   Socioeconomic History   Marital status: Single    Spouse name: Not on file   Number of children: Not on file   Years of education: Not on file   Highest education level: Not on file  Occupational History   Not on file  Tobacco Use   Smoking status: Never   Smokeless tobacco: Never  Vaping Use   Vaping status: Never Used  Substance and Sexual Activity   Alcohol use: Yes    Alcohol/week: 1.0 standard drink of alcohol    Types: 1 Shots of liquor per week    Comment: socially   Drug use: No   Sexual activity: Not on file  Other Topics Concern   Not on file  Social History Narrative   Not on file   Social Drivers of Health   Financial Resource Strain: Low Risk  (10/31/2023)   Overall Financial Resource Strain (CARDIA)    Difficulty of Paying Living Expenses: Not hard at all  Food Insecurity: No Food Insecurity (10/31/2023)   Hunger Vital Sign    Worried About Running Out of Food in the Last Year: Never true    Ran Out of Food in the Last Year: Never true  Transportation Needs:  Unmet Transportation Needs (10/31/2023)   PRAPARE - Transportation    Lack of Transportation (Medical): Yes    Lack of Transportation (Non-Medical): Yes  Physical Activity: Insufficiently Active (10/31/2023)   Exercise Vital Sign    Days of Exercise per Week: 2 days    Minutes of Exercise per Session: 20 min  Stress: No Stress Concern Present (10/31/2023)   [employer] of Occupational Health - Occupational Stress Questionnaire    Feeling of Stress : Not at all  Social Connections: Moderately Integrated (10/31/2023)   Social Connection and Isolation Panel [NHANES]    Frequency of Communication with Friends and Family: Once a  week    Frequency of Social Gatherings with Friends and Family: Three times a week    Attends Religious Services: 1 to 4 times per year    Active Member of Clubs or Organizations: Yes    Attends Banker Meetings: 1 to 4 times per year    Marital Status: Widowed  Intimate Partner Violence: Not At Risk (10/31/2023)   Humiliation, Afraid, Rape, and Kick questionnaire    Fear of Current or Ex-Partner: No    Emotionally Abused: No    Physically Abused: No    Sexually Abused: No    Family History  Problem Relation Age of Onset   Diabetes Sister    Healthy Mother    Healthy Father    Colon cancer Neg Hx    Breast cancer Neg Hx     Past Surgical History:  Procedure Laterality Date   ABDOMINAL HYSTERECTOMY     BIOPSY  10/22/2022   Procedure: BIOPSY;  Surgeon: Tressia Danas, MD;  Location: [facility] ENDOSCOPY;  Service: Gastroenterology;;   BIOPSY  01/12/2023   Procedure: BIOPSY;  Surgeon: Beverley Fiedler, MD;  Location: [facility] ENDOSCOPY;  Service: Gastroenterology;;   ESOPHAGOGASTRODUODENOSCOPY (EGD) WITH PROPOFOL N/A 10/22/2022   Procedure: ESOPHAGOGASTRODUODENOSCOPY (EGD) WITH PROPOFOL;  Surgeon: Tressia Danas, MD;  Location: [facility] ENDOSCOPY;  Service: Gastroenterology;  Laterality: N/A;   ESOPHAGOGASTRODUODENOSCOPY (EGD) WITH PROPOFOL N/A 01/12/2023    Procedure: ESOPHAGOGASTRODUODENOSCOPY (EGD) WITH PROPOFOL;  Surgeon: Beverley Fiedler, MD;  Location: [facility] ENDOSCOPY;  Service: Gastroenterology;  Laterality: N/A;   FEMUR IM NAIL Left 04/17/2020   Procedure: INTRAMEDULLARY (IM) RETROGRADE FEMORAL NAILING;  Surgeon: Cammy Copa, MD;  [facility] OR;  Service: Orthopedics;  Laterality: Left;   nasal polyps     removed just this past tuesday at Outpt facility [facility] by [facility]   TOTAL KNEE ARTHROPLASTY Left 10/01/2016   Procedure: LEFT TOTAL KNEE ARTHROPLASTY;  Surgeon: Cammy Copa, MD;  Location: [facility] OR;  Service: Orthopedics;  Laterality: Left;    ROS: Review of Systems Negative except as stated above  PHYSICAL EXAM: BP (!) 150/80   Pulse 90   Ht 5\' 7"  (1.702 m)   Wt 114 lb (51.7 kg)   SpO2 95%   BMI 17.85 kg/m   Physical Exam  General appearance -older African-American female who appears a little underweight.  She is in NAD.  Some of her clothes are soiled. Mental status -patient answers questions appropriately. Neck - supple, no significant adenopathy Chest -breath sounds mildly decreased bilaterally.  No wheezes heard at this time. Heart - normal rate, regular rhythm, normal S1, S2, no murmurs, rubs, clicks or gallops Extremities -no lower extremity edema      Latest Ref Rng & Units 04/08/2023    4:53 PM 01/13/2023    7:52 AM 01/12/2023    3:15 AM  CMP  Glucose 70 - 99 mg/dL  161  95   BUN 8 - 23 mg/dL  9  35   Creatinine 0.96 - 1.00 mg/dL  0.45  4.09   Sodium 811 - 145 mmol/L  132  131   Potassium 3.5 - 5.2 mmol/L 3.6  3.0  4.2   Chloride 98 - 111 mmol/L  94  97   CO2 22 - 32 mmol/L  26  24   Calcium 8.9 - 10.3 mg/dL  9.1  8.5   Total Protein 6.5 - 8.1 g/dL   5.5   Total Bilirubin 0.3 - 1.2 mg/dL   1.0  Alkaline Phos 38 - 126 U/L   58   AST 15 - 41 U/L   22   ALT 0 - 44 U/L   11    Lipid Panel     Component Value Date/Time   CHOL 226 (H) 06/11/2021 1037   TRIG 87 06/11/2021 1037   HDL 108 06/11/2021  1037   CHOLHDL 2.1 06/11/2021 1037   CHOLHDL 2.5 05/20/2016 1452   VLDL 26 05/20/2016 1452   LDLCALC 103 (H) 06/11/2021 1037    CBC    Component Value Date/Time   WBC 5.3 04/08/2023 1653   WBC 6.5 01/13/2023 0752   RBC 3.90 04/08/2023 1653   RBC 2.47 (L) 01/13/2023 0752   HGB 11.4 04/08/2023 1653   HCT 34.3 04/08/2023 1653   PLT 369 04/08/2023 1653   MCV 88 04/08/2023 1653   MCH 29.2 04/08/2023 1653   MCH 34.0 01/13/2023 0752   MCHC 33.2 04/08/2023 1653   MCHC 34.1 01/13/2023 0752   RDW 20.2 (H) 04/08/2023 1653   LYMPHSABS 1.0 04/08/2023 1653   MONOABS 0.5 10/21/2022 0333   EOSABS 0.1 04/08/2023 1653   BASOSABS 0.1 04/08/2023 1653    ASSESSMENT AND PLAN: 1. Essential hypertension (Primary) Not at goal but repeat blood pressure is better.  Continue Cozaar.  Add amlodipine 5 mg daily. - losartan (COZAAR) 100 MG tablet; Take 1 tablet (100 mg total) by mouth daily.  Dispense: 90 tablet; Refill: 0 - amLODipine (NORVASC) 5 MG tablet; Take 1 tablet (5 mg total) by mouth daily.  Dispense: 90 tablet; Refill: 0  2. Moderate persistent asthma without complication Refill given on albuterol.  Will restart Advair inhaler.  Advised to wash mouth out after each use to prevent thrush.  Prescription sent to [facility] for nebulizer machine. - albuterol (VENTOLIN HFA) 108 (90 Base) MCG/ACT inhaler; Inhale 2 puffs into the lungs every 6 (six) hours as needed for wheezing or shortness of breath.  Dispense: 34 g; Refill: 2 - For home use only DME Nebulizer machine - fluticasone-salmeterol (ADVAIR) 100-50 MCG/ACT AEPB; Inhale 1 puff into the lungs 2 (two) times daily.  Dispense: 1 each; Refill: 3  3. Cough, unspecified type See #2 above.  4. Assistance needed with transportation Message sent to case worker to assist with getting scant for them.  5. Need for influenza vaccination - Flu Vaccine Trivalent High Dose (Fluad)    Patient was given the opportunity to ask questions.  Patient  verbalized understanding of the plan and was able to repeat key elements of the plan.   This documentation was completed using Paediatric nurse.  Any transcriptional errors are unintentional.  Orders Placed This Encounter  Procedures   For home use only DME Nebulizer machine   Flu Vaccine Trivalent High Dose (Fluad)     Requested Prescriptions   Signed Prescriptions Disp Refills   losartan (COZAAR) 100 MG tablet 90 tablet 0    Sig: Take 1 tablet (100 mg total) by mouth daily.   albuterol (VENTOLIN HFA) 108 (90 Base) MCG/ACT inhaler 34 g 2    Sig: Inhale 2 puffs into the lungs every 6 (six) hours as needed for wheezing or shortness of breath.   amLODipine (NORVASC) 5 MG tablet 90 tablet 0    Sig: Take 1 tablet (5 mg total) by mouth daily.   fluticasone-salmeterol (ADVAIR) 100-50 MCG/ACT AEPB 1 each 3    Sig: Inhale 1 puff into the lungs 2 (two) times daily.    Return  in about 2 months (around 12/29/2023) for Give f/u with Dr. Alvis Lemmings.  Jonah Blue, MD, FACP

## 2023-11-01 DIAGNOSIS — J4540 Moderate persistent asthma, uncomplicated: Secondary | ICD-10-CM | POA: Diagnosis not present

## 2023-11-12 ENCOUNTER — Telehealth: Payer: Self-pay

## 2023-11-12 NOTE — Telephone Encounter (Signed)
 Attempted to contact patient and no answer.   Copied from CRM [id number]. Topic: General - Other >> Nov 12, 2023  9:35 AM Everette C wrote: Reason for CRM: [name] with [facility] has called requesting assistance from the practice for getting in contact with the patient to complete their verification process   Please provide the patient with Michael's contact number [phone] when possible

## 2023-11-17 DIAGNOSIS — J4540 Moderate persistent asthma, uncomplicated: Secondary | ICD-10-CM | POA: Diagnosis not present

## 2023-11-19 ENCOUNTER — Other Ambulatory Visit: Payer: Self-pay | Admitting: Family Medicine

## 2023-11-19 DIAGNOSIS — J4520 Mild intermittent asthma, uncomplicated: Secondary | ICD-10-CM

## 2023-11-19 NOTE — Telephone Encounter (Unsigned)
 Copied from CRM [id number]. Topic: Clinical - Medication Refill >> Nov 19, 2023  1:43 PM Gildardo Pounds wrote: Most Recent Primary Care Visit:  Provider: Jonah Blue B  Department: [facility]  Visit Type: HOSPITAL FOLLOW UP  Date: 10/31/2023  Medication: albuterol (PROVENTIL) (2.5 MG/3ML) 0.083% nebulizer solution  Has the patient contacted their pharmacy? No (Agent: If no, request that the patient contact the pharmacy for the refill. If patient does not wish to contact the pharmacy document the reason why and proceed with request.) (Agent: If yes, when and what did the pharmacy advise?)  Is this the correct pharmacy for this prescription? Yes If no, delete pharmacy and type the correct one.  This is the patient's preferred pharmacy:  [phone] [place] Otto, [place] - [address] [place] [address] Phone: [phone] Fax: [phone]  Has the prescription been filled recently? No  Is the patient out of the medication? Yes  Has the patient been seen for an appointment in the last year OR does the patient have an upcoming appointment? Yes  Can we respond through MyChart? No  Agent: Please be advised that Rx refills may take up to 3 business days. We ask that you follow-up with your pharmacy.

## 2023-11-20 MED ORDER — ALBUTEROL SULFATE (2.5 MG/3ML) 0.083% IN NEBU: 2.5000 mg | INHALATION_SOLUTION | Freq: Four times a day (QID) | RESPIRATORY_TRACT | 3 refills | Status: DC | PRN

## 2023-11-20 NOTE — Telephone Encounter (Signed)
 Requested medication (s) are due for refill today - expired Rx  Requested medication (s) are on the active medication list -yes  Future visit scheduled -no  Last refill: 08/16/20 75ml 3RF  Notes to clinic: expired Rx- sent for review   Requested Prescriptions  Pending Prescriptions Disp Refills   albuterol (PROVENTIL) (2.5 MG/3ML) 0.083% nebulizer solution 75 mL 3    Sig: Take 3 mLs (2.5 mg total) by nebulization every 6 (six) hours as needed for wheezing or shortness of breath.     Pulmonology:  Beta Agonists 2 Failed - 11/20/2023  2:25 PM      Failed - Last BP in normal range    BP Readings from Last 1 Encounters:  10/31/23 (!) 150/80         Passed - Last Heart Rate in normal range    Pulse Readings from Last 1 Encounters:  10/31/23 90         Passed - Valid encounter within last 12 months    Recent Outpatient Visits           2 weeks ago Essential hypertension   [facility] [place]. [facility] Marcine Matar, MD   7 months ago Other insomnia   [facility] [place] [facility] [place]. [facility] [name], MD   2 years ago Hypokalemia   [facility] [place] [facility] [place]. [facility] [place], Marzella Schlein, [place]   2 years ago Melena   [facility] [place] [facility] [place]. [facility] [name], MD   3 years ago Pure hypercholesterolemia   [facility] [place] [facility] Of [place]. [facility] [name], MD                 Requested Prescriptions  Pending Prescriptions Disp Refills   albuterol (PROVENTIL) (2.5 MG/3ML) 0.083% nebulizer solution 75 mL 3    Sig: Take 3 mLs (2.5 mg total) by nebulization every 6 (six) hours as needed for wheezing or shortness of breath.     Pulmonology:  Beta Agonists 2 Failed - 11/20/2023  2:25 PM      Failed - Last BP in normal range    BP Readings from Last 1  Encounters:  10/31/23 (!) 150/80         Passed - Last Heart Rate in normal range    Pulse Readings from Last 1 Encounters:  10/31/23 90         Passed - Valid encounter within last 12 months    Recent Outpatient Visits           2 weeks ago Essential hypertension   [facility] [place]. [facility] Marcine Matar, MD   7 months ago Other insomnia   [facility] [place] [facility] [place]. [facility] [name], MD   2 years ago Hypokalemia   [facility] [place] [facility] [place]. [facility] [place], Marzella Schlein, [place]   2 years ago Melena   [facility] [place] [facility] [place]. [facility] [name], MD   3 years ago Pure hypercholesterolemia   [facility] [place] - A Dept Of . [facility]  [name], MD

## 2023-12-02 ENCOUNTER — Ambulatory Visit: Attending: Family Medicine | Admitting: Family Medicine

## 2023-12-02 DIAGNOSIS — J4540 Moderate persistent asthma, uncomplicated: Secondary | ICD-10-CM | POA: Diagnosis not present

## 2023-12-11 ENCOUNTER — Ambulatory Visit: Payer: Self-pay

## 2023-12-11 ENCOUNTER — Other Ambulatory Visit: Payer: Self-pay | Admitting: Family Medicine

## 2023-12-11 DIAGNOSIS — J4540 Moderate persistent asthma, uncomplicated: Secondary | ICD-10-CM

## 2023-12-11 MED ORDER — ALBUTEROL SULFATE HFA 108 (90 BASE) MCG/ACT IN AERS: 2.0000 | INHALATION_SPRAY | Freq: Four times a day (QID) | RESPIRATORY_TRACT | 5 refills | Status: DC | PRN

## 2023-12-11 NOTE — Telephone Encounter (Signed)
 Chief Complaint: cough Symptoms: cough and runny nose Frequency: about a month Pertinent Negatives: Patient denies fever or chest pain Disposition: [] ED /[] Urgent Care (no appt availability in office) / [x] Appointment(In office/virtual)/ []  [place] [facility]/ [] Home Care/ [] Refused Recommended Disposition /[] [place] [facility]/ []  Follow-up with PCP Additional Notes: pt's caretaker called states that pt still have a cough and has coughing spells with sob when coughing.  He asked for refills on her inhaler and nebulizer solution, instructed him she has refills available at [facility].   Copied from CRM [id number]. Topic: Clinical - Red Word Triage >> Dec 11, 2023  9:22 AM Ivette P wrote: [facility] that prompted transfer to Nurse Triage: Pt having a hard time breathing, Jonah is very concerned about the pt. Jonah - caretaker and boyfriend Reason for Disposition  [1] Continuous (nonstop) coughing interferes with work or school AND [2] no improvement using cough treatment per Care Advice  Answer Assessment - Initial Assessment Questions 1. ONSET: "When did the cough begin?"      About a month 2. SEVERITY: "How bad is the cough today?"      severe 3. SPUTUM: "Describe the color of your sputum" (none, dry cough; clear, white, yellow, green)     dry 4. HEMOPTYSIS: "Are you coughing up any blood?" If so ask: "How much?" (flecks, streaks, tablespoons, etc.)     no 5. DIFFICULTY BREATHING: "Are you having difficulty breathing?" If Yes, ask: "How bad is it?" (e.g., mild, moderate, severe)    - MILD: No SOB at rest, mild SOB with walking, speaks normally in sentences, can lie down, no retractions, pulse < 100.    - MODERATE: SOB at rest, SOB with minimal exertion and prefers to sit, cannot lie down flat, speaks in phrases, mild retractions, audible wheezing, pulse 100-120.    - SEVERE: Very SOB at rest, speaks in single words, struggling to breathe, sitting hunched forward, retractions, pulse > 120       moderate 6. FEVER: "Do you have a fever?" If Yes, ask: "What is your temperature, how was it measured, and when did it start?"     fever 7. CARDIAC HISTORY: "Do you have any history of heart disease?" (e.g., heart attack, congestive heart failure)      no 8. LUNG HISTORY: "Do you have any history of lung disease?"  (e.g., pulmonary embolus, asthma, emphysema)     asthma 10. OTHER SYMPTOMS: "Do you have any other symptoms?" (e.g., runny nose, wheezing, chest pain)       wheezing  Protocols used: Cough - Chronic-A-AH

## 2023-12-11 NOTE — Telephone Encounter (Signed)
 Copied from CRM [id number]. Topic: Clinical - Medication Refill >> Dec 11, 2023  9:25 AM Bobbye Morton wrote: Most Recent Primary Care Visit:  Provider: Jonah Blue B  [facility]  Visit Type: HOSPITAL FOLLOW UP  Date: 10/31/2023  Medication: albuterol (VENTOLIN HFA) 108 (90 Base) MCG/ACT inhaler  Has the patient contacted their pharmacy? Yes (Agent: If no, request that the patient contact the pharmacy for the refill. If patient does not wish to contact the pharmacy document the reason why and proceed with request.) (Agent: If yes, when and what did the pharmacy advise?)  Is this the correct pharmacy for this prescription? Yes If no, delete pharmacy and type the correct one.  This is the patient's preferred pharmacy:  [phone] [place], [place] - [address] [place] [address] Phone: [phone] Fax: [phone]   Has the prescription been filled recently? Yes, 10/31/2023  Is the patient out of the medication? Yes  Has the patient been seen for an appointment in the last year OR does the patient have an upcoming appointment? No  Can we respond through MyChart? No  Agent: Please be advised that Rx refills may take up to 3 business days. We ask that you follow-up with your pharmacy.

## 2023-12-11 NOTE — Telephone Encounter (Signed)
 Noted patient has appointment scheduled with another office tomorrow.

## 2023-12-11 NOTE — Telephone Encounter (Signed)
 Requested Prescriptions  Pending Prescriptions Disp Refills   albuterol (VENTOLIN HFA) 108 (90 Base) MCG/ACT inhaler 34 g 2    Sig: Inhale 2 puffs into the lungs every 6 (six) hours as needed for wheezing or shortness of breath.     Pulmonology:  Beta Agonists 2 Failed - 12/11/2023  9:44 AM      Failed - Last BP in normal range    BP Readings from Last 1 Encounters:  10/31/23 (!) 150/80         Passed - Last Heart Rate in normal range    Pulse Readings from Last 1 Encounters:  10/31/23 90         Passed - Valid encounter within last 12 months    Recent Outpatient Visits           1 month ago Essential hypertension   [facility] - A Dept Of [place]. [facility] Marcine Matar, MD   8 months ago Other insomnia   [facility] - A Dept Of [place]. [facility] [name], MD   2 years ago Hypokalemia   [facility] - [facility] Dept Of [place]. [facility] [place], Marzella Schlein, [place]   2 years ago Melena   [facility] - A Dept Of [place]. [facility] [name], MD   3 years ago Pure hypercholesterolemia   [facility] - A Dept Of . [facility] [name], MD

## 2023-12-12 ENCOUNTER — Ambulatory Visit (INDEPENDENT_AMBULATORY_CARE_PROVIDER_SITE_OTHER): Payer: Self-pay | Admitting: Family Medicine

## 2023-12-12 ENCOUNTER — Encounter: Payer: Self-pay | Admitting: Family Medicine

## 2023-12-12 VITALS — BP 120/78 | HR 85 | Temp 98.5°F | Ht 67.0 in | Wt 114.0 lb

## 2023-12-12 DIAGNOSIS — R059 Cough, unspecified: Secondary | ICD-10-CM

## 2023-12-12 DIAGNOSIS — J4521 Mild intermittent asthma with (acute) exacerbation: Secondary | ICD-10-CM | POA: Diagnosis not present

## 2023-12-12 DIAGNOSIS — Z2821 Immunization not carried out because of patient refusal: Secondary | ICD-10-CM | POA: Diagnosis not present

## 2023-12-12 DIAGNOSIS — J4520 Mild intermittent asthma, uncomplicated: Secondary | ICD-10-CM

## 2023-12-12 MED ORDER — FLUTICASONE-SALMETEROL 500-50 MCG/ACT IN AEPB: 1.0000 | INHALATION_SPRAY | Freq: Two times a day (BID) | RESPIRATORY_TRACT | 2 refills | Status: DC

## 2023-12-12 MED ORDER — TRIAMCINOLONE ACETONIDE 40 MG/ML IJ SUSP
60.0000 mg | Freq: Once | INTRAMUSCULAR | Status: AC
  Administered 2023-12-12: 60 mg via INTRAMUSCULAR

## 2023-12-12 MED ORDER — PREDNISONE 20 MG PO TABS: 20.0000 mg | ORAL_TABLET | Freq: Every day | ORAL | 0 refills | Status: AC

## 2023-12-12 MED ORDER — MONTELUKAST SODIUM 10 MG PO TABS: 10.0000 mg | ORAL_TABLET | Freq: Every day | ORAL | 3 refills | Status: AC

## 2023-12-12 NOTE — Progress Notes (Signed)
 Del Favia, CMA,acting as a Neurosurgeon for Melodie Spry, NP.,have documented all relevant documentation on the behalf of Melodie Spry, NP,as directed by  Melodie Spry, NP while in the presence of Melodie Spry, NP.  Subjective:  Patient ID: Angel Hebert , female    DOB: Jul 13, 1956 , 68 y.o.   MRN: [id number]  Chief Complaint  Patient presents with   Cough    HPI  Patient is a 68 year old female with a diagnosis of hypertension, asthma who presents today for evaluation of cough that has been ongoing for about a month.She reports that the cough keeps her up at night time. Patient states that she is out of her advair maintenance inhaler. She is exposed to nicotine daily because significant other is smokes cigarettes daily.   Cough This is a recurrent problem. The current episode started 1 to 4 weeks ago. The problem has been rapidly worsening. The cough is Non-productive. Pertinent negatives include no chills, fever or heartburn. Risk factors for lung disease include smoking/tobacco exposure. Her past medical history is significant for asthma.     Past Medical History:  Diagnosis Date   Arthritis    Asthma    last year in December   Dyspnea    GERD (gastroesophageal reflux disease)    High cholesterol    Hypertension    Pneumonia 01/2017     Family History  Problem Relation Age of Onset   Diabetes Sister    Healthy Mother    Healthy Father    Colon cancer Neg Hx    Breast cancer Neg Hx      Current Outpatient Medications:    albuterol  (PROVENTIL ) (2.5 MG/3ML) 0.083% nebulizer solution, Take 3 mLs (2.5 mg total) by nebulization every 6 (six) hours as needed for wheezing or shortness of breath., Disp: 75 mL, Rfl: 3   albuterol  (VENTOLIN  HFA) 108 (90 Base) MCG/ACT inhaler, Inhale 2 puffs into the lungs every 6 (six) hours as needed for wheezing or shortness of breath., Disp: 34 g, Rfl: 5   amLODipine  (NORVASC ) 5 MG tablet, Take 1 tablet (5 mg total) by mouth daily., Disp: 90  tablet, Rfl: 0   atorvastatin  (LIPITOR) 20 MG tablet, TAKE 1 TABLET BY MOUTH DAILY AT 6PM (Patient taking differently: Take 20 mg by mouth daily.), Disp: 90 tablet, Rfl: 0   benzonatate  (TESSALON ) 100 MG capsule, Take 1 capsule (100 mg total) by mouth every 8 (eight) hours., Disp: 21 capsule, Rfl: 0   Bismuth /Metronidaz/Tetracyclin 140-125-125 MG CAPS, TAKE 3 CAPSULES BY MOUTH IN THE MORNING, AT NOON, IN THE EVENING, AND AT BEDTIME., Disp: 120 capsule, Rfl: 0   fluticasone  (FLONASE ) 50 MCG/ACT nasal spray, Place 1 spray into both nostrils daily., Disp: , Rfl:    folic acid  (FOLVITE ) 1 MG tablet, Take 1 tablet (1 mg total) by mouth daily., Disp: 30 tablet, Rfl: 0   HYDROcodone -acetaminophen  (NORCO/VICODIN) 5-325 MG tablet, Take 1 tablet by mouth every 6 (six) hours as needed for severe pain., Disp: 10 tablet, Rfl: 0   hydrOXYzine  (ATARAX ) 25 MG tablet, Take 1 tablet (25 mg total) by mouth at bedtime as needed., Disp: 90 tablet, Rfl: 0   losartan  (COZAAR ) 100 MG tablet, Take 1 tablet (100 mg total) by mouth daily., Disp: 90 tablet, Rfl: 0   MAGnesium -Oxide 400 (240 Mg) MG tablet, Take 1 tablet (400 mg total) by mouth daily., Disp: 5 tablet, Rfl: 0   montelukast  (SINGULAIR ) 10 MG tablet, Take 1 tablet (10 mg total) by mouth  daily., Disp: 30 tablet, Rfl: 3   pantoprazole  (PROTONIX ) 40 MG tablet, Take 1 tablet (40 mg total) by mouth 2 (two) times daily., Disp: 60 tablet, Rfl: 2   pantoprazole  (PROTONIX ) 40 MG tablet, TAKE 1 TABLET (40 MG TOTAL) BY MOUTH TWICE A DAY BEFORE MEALS, Disp: 60 tablet, Rfl: 0   potassium chloride  SA (KLOR-CON  M) 20 MEQ tablet, Take 2 tablets (40 mEq total) by mouth daily., Disp: 2 tablet, Rfl: 0   thiamine  (VITAMIN B1) 100 MG tablet, Take 1 tablet (100 mg total) by mouth daily., Disp: 30 tablet, Rfl: 0   fluticasone -salmeterol (ADVAIR) 500-50 MCG/ACT AEPB, Inhale 1 puff into the lungs in the morning and at bedtime., Disp: 60 each, Rfl: 2   No Known Allergies   Review of  Systems  Constitutional:  Negative for chills and fever.  Respiratory:  Positive for cough.   Gastrointestinal:  Negative for heartburn.     Today's Vitals   12/12/23 1135  BP: 120/78  Pulse: 85  Temp: 98.5 F (36.9 C)  TempSrc: Oral  Weight: 114 lb (51.7 kg)  Height: 5\' 7"  (1.702 m)  PainSc: 0-No pain   Body mass index is 17.85 kg/m.  Wt Readings from Last 3 Encounters:  12/12/23 114 lb (51.7 kg)  10/31/23 114 lb (51.7 kg)  10/07/23 120 lb (54.4 kg)    The ASCVD Risk score (Arnett DK, et al., 2019) failed to calculate for the following reasons:   The valid HDL cholesterol range is 20 to 100 mg/dL  Objective:  Physical Exam      Assessment And Plan:  Mild intermittent asthma with acute exacerbation -     predniSONE ; Take 1 tablet (20 mg total) by mouth daily with breakfast for 5 days.  Dispense: 5 tablet; Refill: 0 -     Fluticasone -Salmeterol; Inhale 1 puff into the lungs in the morning and at bedtime.  Dispense: 60 each; Refill: 2 -     Montelukast  Sodium; Take 1 tablet (10 mg total) by mouth daily.  Dispense: 30 tablet; Refill: 3  Cough, unspecified type  Herpes zoster vaccination declined  COVID-19 vaccination declined -     Triamcinolone  Acetonide    Return for follow up with PCP. Aaron Aas  Patient was given opportunity to ask questions. Patient verbalized understanding of the plan and was able to repeat key elements of the plan. All questions were answered to their satisfaction.    I, Melodie Spry, NP, have reviewed all documentation for this visit. The documentation on 12/23/2023 for the exam, diagnosis, procedures, and orders are all accurate and complete.    IF YOU HAVE BEEN REFERRED TO A SPECIALIST, IT MAY TAKE 1-2 WEEKS TO SCHEDULE/PROCESS THE REFERRAL. IF YOU HAVE NOT HEARD FROM US /SPECIALIST IN TWO WEEKS, PLEASE GIVE US  A CALL AT [phone].

## 2023-12-23 ENCOUNTER — Other Ambulatory Visit: Payer: Self-pay | Admitting: Family Medicine

## 2023-12-23 DIAGNOSIS — Z2821 Immunization not carried out because of patient refusal: Secondary | ICD-10-CM | POA: Insufficient documentation

## 2023-12-23 DIAGNOSIS — R059 Cough, unspecified: Secondary | ICD-10-CM | POA: Insufficient documentation

## 2023-12-23 DIAGNOSIS — J4520 Mild intermittent asthma, uncomplicated: Secondary | ICD-10-CM

## 2023-12-23 NOTE — Telephone Encounter (Unsigned)
 Copied from CRM [id number]. Topic: Clinical - Medication Refill >> Dec 23, 2023  2:52 PM Everette C wrote: Most Recent Primary Care Visit:  Provider: Jarrett Merry, PAT  Department: [facility]  Visit Type: ACUTE  Date: 12/12/2023  Medication: albuterol  (PROVENTIL ) (2.5 MG/3ML) 0.083% nebulizer solution [[id number]]  Has the patient contacted their pharmacy? Yes (Agent: If no, request that the patient contact the pharmacy for the refill. If patient does not wish to contact the pharmacy document the reason why and proceed with request.) (Agent: If yes, when and what did the pharmacy advise?)  Is this the correct pharmacy for this prescription? Yes If no, delete pharmacy and type the correct one.  This is the patient's preferred pharmacy:  [facility] [phone] Jonette Nestle, [place] - [address] [place] [address] Phone: [phone] Fax: [phone]  Has the prescription been filled recently? Yes  Is the patient out of the medication? Yes  Has the patient been seen for an appointment in the last year OR does the patient have an upcoming appointment? No  Can we respond through MyChart? No  Agent: Please be advised that Rx refills may take up to 3 business days. We ask that you follow-up with your pharmacy.

## 2023-12-24 NOTE — Telephone Encounter (Signed)
 Duplicate request, last refill 11/20/23.  Requested Prescriptions  Pending Prescriptions Disp Refills   albuterol  (PROVENTIL ) (2.5 MG/3ML) 0.083% nebulizer solution 75 mL 3    Sig: Take 3 mLs (2.5 mg total) by nebulization every 6 (six) hours as needed for wheezing or shortness of breath.     Pulmonology:  Beta Agonists 2 Passed - 12/24/2023 12:44 PM      Passed - Last BP in normal range    BP Readings from Last 1 Encounters:  12/12/23 120/78         Passed - Last Heart Rate in normal range    Pulse Readings from Last 1 Encounters:  12/12/23 85         Passed - Valid encounter within last 12 months    Recent Outpatient Visits           1 month ago Essential hypertension   [facility] Dept Of [place]. [facility] Lawrance Presume, MD   8 months ago Other insomnia   [facility] Dept Of [place]. [facility] Joaquin Mulberry, MD   2 years ago Hypokalemia   [facility] - [facility] Dept Of [place]. [facility] [place], Stan Eans, [place]   2 years ago Melena   [facility] - A Dept Of [place]. [facility] Joaquin Mulberry, MD   3 years ago Pure hypercholesterolemia   [facility] [place] - A Dept Of [place]. [facility] Joaquin Mulberry, MD

## 2023-12-26 ENCOUNTER — Ambulatory Visit: Attending: Internal Medicine | Admitting: Internal Medicine

## 2023-12-26 ENCOUNTER — Ambulatory Visit: Payer: Self-pay

## 2023-12-26 ENCOUNTER — Encounter: Payer: Self-pay | Admitting: Internal Medicine

## 2023-12-26 VITALS — BP 158/84 | HR 105 | Temp 98.2°F | Ht 67.0 in | Wt 115.0 lb

## 2023-12-26 DIAGNOSIS — R1033 Periumbilical pain: Secondary | ICD-10-CM | POA: Diagnosis not present

## 2023-12-26 DIAGNOSIS — F1090 Alcohol use, unspecified, uncomplicated: Secondary | ICD-10-CM

## 2023-12-26 MED ORDER — PANTOPRAZOLE SODIUM 40 MG PO TBEC: 40.0000 mg | DELAYED_RELEASE_TABLET | Freq: Every day | ORAL | 2 refills | Status: DC

## 2023-12-26 NOTE — Progress Notes (Signed)
 Patient ID: Angel Hebert, female    DOB: [birth year]  MRN: [id number]  CC: Abdominal Pain (Abdominal pain X3-4 days - suspects inhalers causing pain)   Subjective: Angel Hebert is a 68 y.o. female who presents for acute visit.  PCP is Dr. Newlin.  BF, Jonah, is with her and provides some of the history  Her concerns today include:  Pt with hx of HTN, HL, asthma, GERD, OA knees, CHFrEF, anemia   "I have ulcers in my stomach and they are giving me problems."  She points to the mid section of the abdomen and around the umbilicus. Reports aching pain in mid to upper abdomen x 4-5 days; intermittent; last about 30 mins. Drank some almond milk which helps.  No particular foods make it worse.   No N/V/D. No blood in stools or black stools.  Last BM was this morning.  No dysuria or fever Endorses drinking ETOH daily; about two 40 oz beer a day [facility] 01/2023 with GIB due to duodenal ulcers. Was on Pantoprazole  but out of it for a while.   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   Duodenal ulcer 10/22/2022   Acute GI bleeding 10/21/2022   GI bleed 10/21/2022   Protein-calorie malnutrition, severe (HCC) 10/21/2022   Acute on chronic blood loss anemia 10/21/2022   Hyponatremia 10/21/2022   Hypomagnesemia 10/21/2022   Hyperphosphatemia 10/21/2022   AKI (acute kidney injury) (HCC)    Alcohol induced acute pancreatitis 07/26/2021   Gastritis 07/25/2021   Periprosthetic fracture around internal prosthetic left knee joint 04/17/2020   Closed fracture of left distal femur (HCC) 04/16/2020   Symptomatic anemia 07/12/2019   Hypokalemia 07/12/2019   Lobar pneumonia (HCC) 07/12/2019   Insomnia 03/03/2017   History of total knee arthroplasty, left 11/27/2016   Arthritis of knee 10/01/2016   Chronic  pansinusitis 09/05/2016   Physical deconditioning    Anemia, iron  deficiency    Pressure ulcer 10/14/2015   CAP (community acquired pneumonia)    Sepsis due to Streptococcus pneumoniae (HCC)    Hyperlipidemia 03/02/2007   Essential hypertension 03/02/2007   Allergic rhinitis 03/02/2007   Asthma 03/02/2007   GERD 03/02/2007     Current Outpatient Medications on File Prior to Visit  Medication Sig Dispense Refill   albuterol  (PROVENTIL ) (2.5 MG/3ML) 0.083% nebulizer solution Take 3 mLs (2.5 mg total) by nebulization every 6 (six) hours as needed for wheezing or shortness of breath. 75 mL 3   amLODipine  (NORVASC ) 5 MG tablet Take 1 tablet (5 mg total) by mouth daily. 90 tablet 0   benzonatate  (TESSALON ) 100 MG capsule Take 1 capsule (100 mg total) by mouth every 8 (eight) hours. 21 capsule 0   fluticasone  (FLONASE ) 50 MCG/ACT nasal spray Place 1 spray into both nostrils daily.     fluticasone -salmeterol (ADVAIR) 500-50 MCG/ACT AEPB Inhale 1 puff into the lungs in the morning and at bedtime. 60 each 2   folic acid  (FOLVITE ) 1 MG tablet Take 1 tablet (1 mg total) by mouth daily. 30 tablet 0   hydrOXYzine  (ATARAX ) 25 MG tablet Take 1 tablet (25 mg total) by mouth at bedtime as needed. 90 tablet 0   losartan  (COZAAR ) 100 MG tablet Take 1 tablet (100 mg total) by mouth daily. 90 tablet 0   albuterol  (VENTOLIN  HFA) 108 (90 Base)  MCG/ACT inhaler Inhale 2 puffs into the lungs every 6 (six) hours as needed for wheezing or shortness of breath. (Patient not taking: Reported on 12/26/2023) 34 g 5   atorvastatin  (LIPITOR) 20 MG tablet TAKE 1 TABLET BY MOUTH DAILY AT 6PM (Patient not taking: Reported on 12/26/2023) 90 tablet 0   Bismuth /Metronidaz/Tetracyclin 140-125-125 MG CAPS TAKE 3 CAPSULES BY MOUTH IN THE MORNING, AT NOON, IN THE EVENING, AND AT BEDTIME. (Patient not taking: Reported on 12/26/2023) 120 capsule 0   HYDROcodone -acetaminophen  (NORCO/VICODIN) 5-325 MG tablet Take 1 tablet by mouth every 6  (six) hours as needed for severe pain. (Patient not taking: Reported on 12/26/2023) 10 tablet 0   MAGnesium -Oxide 400 (240 Mg) MG tablet Take 1 tablet (400 mg total) by mouth daily. (Patient not taking: Reported on 12/26/2023) 5 tablet 0   montelukast  (SINGULAIR ) 10 MG tablet Take 1 tablet (10 mg total) by mouth daily. (Patient not taking: Reported on 12/26/2023) 30 tablet 3   pantoprazole  (PROTONIX ) 40 MG tablet TAKE 1 TABLET (40 MG TOTAL) BY MOUTH TWICE A DAY BEFORE MEALS (Patient not taking: Reported on 12/26/2023) 60 tablet 0   potassium chloride  SA (KLOR-CON  M) 20 MEQ tablet Take 2 tablets (40 mEq total) by mouth daily. (Patient not taking: Reported on 12/26/2023) 2 tablet 0   thiamine  (VITAMIN B1) 100 MG tablet Take 1 tablet (100 mg total) by mouth daily. (Patient not taking: Reported on 12/26/2023) 30 tablet 0   [DISCONTINUED] traZODone  (DESYREL ) 50 MG tablet Take 1 tablet (50 mg total) by mouth at bedtime as needed for sleep.     No current facility-administered medications on file prior to visit.    No Known Allergies  Social History   Socioeconomic History   Marital status: Single    Spouse name: Not on file   Number of children: Not on file   Years of education: Not on file   Highest education level: Not on file  Occupational History   Not on file  Tobacco Use   Smoking status: Never   Smokeless tobacco: Never  Vaping Use   Vaping status: Never Used  Substance and Sexual Activity   Alcohol use: Yes    Alcohol/week: 1.0 standard drink of alcohol    Types: 1 Shots of liquor per week    Comment: socially   Drug use: No   Sexual activity: Not on file  Other Topics Concern   Not on file  Social History Narrative   Not on file   Social Drivers of Health   Financial Resource Strain: Low Risk  (10/31/2023)   Overall Financial Resource Strain (CARDIA)    Difficulty of Paying Living Expenses: Not hard at all  Food Insecurity: No Food Insecurity (10/31/2023)   Hunger Vital Sign     Worried About Running Out of Food in the Last Year: Never true    Ran Out of Food in the Last Year: Never true  Transportation Needs: Unmet Transportation Needs (10/31/2023)   PRAPARE - Transportation    Lack of Transportation (Medical): Yes    Lack of Transportation (Non-Medical): Yes  Physical Activity: Insufficiently Active (10/31/2023)   Exercise Vital Sign    Days of Exercise per Week: 2 days    Minutes of Exercise per Session: 20 min  Stress: No Stress Concern Present (10/31/2023)   [employer] of Occupational Health - Occupational Stress Questionnaire    Feeling of Stress : Not at all  Social Connections: Moderately Integrated (10/31/2023)   Social Connection  and Isolation Panel [NHANES]    Frequency of Communication with Friends and Family: Once a week    Frequency of Social Gatherings with Friends and Family: Three times a week    Attends Religious Services: 1 to 4 times per year    Active Member of Clubs or Organizations: Yes    Attends Banker Meetings: 1 to 4 times per year    Marital Status: Widowed  Intimate Partner Violence: Not At Risk (10/31/2023)   Humiliation, Afraid, Rape, and Kick questionnaire    Fear of Current or Ex-Partner: No    Emotionally Abused: No    Physically Abused: No    Sexually Abused: No    Family History  Problem Relation Age of Onset   Diabetes Sister    Healthy Mother    Healthy Father    Colon cancer Neg Hx    Breast cancer Neg Hx     Past Surgical History:  Procedure Laterality Date   ABDOMINAL HYSTERECTOMY     BIOPSY  10/22/2022   Procedure: BIOPSY;  Surgeon: Lindle Rhea, MD;  Location: [facility] ENDOSCOPY;  Service: Gastroenterology;;   BIOPSY  01/12/2023   Procedure: BIOPSY;  Surgeon: Nannette Babe, MD;  [facility] END[facility]COPY;  Service: Gastroenterology;;   ESOPHAGOGASTRODUODENOSCOPY (EGD) WITH PROPOFOL  N/A 10/22/2022   Procedure: ESOPHAGOGASTRODUODENOSCOPY (EGD) WITH PROPOFOL ;  Surgeon: Lindle Rhea, MD;   [facility] END[facility];  Service: Gastroenterology;  Laterality: N/A;   ESOPHAGOGASTRODUODENOSCOPY (EGD) WITH PROPOFOL  N/A 01/12/2023   Procedure: ESOPHAGOGASTRODUODENOSCOPY (EGD) WITH PROPOFOL ;  Surgeon: Nannette Babe, MD;  Location: [facility] ENDOSCOPY;  Service: Gastroenterology;  Laterality: N/A;   FEMUR IM NAIL Left 04/17/2020   Procedure: INTRAMEDULLARY (IM) RETROGRADE FEMORAL NAILING;  Surgeon: Jasmine Mesi, MD;  Location: [facility] OR;  Service: Orthopedics;  Laterality: Left;   nasal polyps     removed just this past tuesday at Outpt facility over by [facility]   TOTAL KNEE ARTHROPLASTY Left 10/01/2016   Procedure: LEFT TOTAL KNEE ARTHROPLASTY;  Surgeon: Jasmine Mesi, MD;  [facility];  Service: Orthopedics;  Laterality: Left;    ROS: Review of Systems Negative except as stated above  PHYSICAL EXAM: BP (!) 158/84 (BP Location: Left Arm, Patient Position: Sitting, Cuff Size: Normal)   Pulse (!) 105   Temp 98.2 F (36.8 C) (Oral)   Ht 5\' 7"  (1.702 m)   Wt 115 lb (52.2 kg)   SpO2 99%   BMI 18.01 kg/m   Physical Exam  General appearance -alert slightly unkept elderly African-American female in NAD Mental status -poor historian and has to be helped by her boyfriend Abdomen -normal bowel sounds, nondistended, soft, mild periumbilical tenderness without guarding or rebound.  No hernia appreciated.      Latest Ref Rng & Units 04/08/2023    4:53 PM 01/13/2023    7:52 AM 01/12/2023    3:15 AM  CMP  Glucose 70 - 99 mg/dL  161  95   BUN 8 - 23 mg/dL  9  35   Creatinine 0.96 - 1.00 mg/dL  0.45  4.09   Sodium 811 - 145 mmol/L  132  131   Potassium 3.5 - 5.2 mmol/L 3.6  3.0  4.2   Chloride 98 - 111 mmol/L  94  97   CO2 22 - 32 mmol/L  26  24   Calcium  8.9 - 10.3 mg/dL  9.1  8.5   Total Protein 6.5 - 8.1 g/dL   5.5   Total Bilirubin  0.3 - 1.2 mg/dL   1.0   Alkaline Phos 38 - 126 U/L   58   AST 15 - 41 U/L   22   ALT 0 - 44 U/L   11    Lipid Panel     Component Value  Date/Time   CHOL 226 (H) 06/11/2021 1037   TRIG 87 06/11/2021 1037   HDL 108 06/11/2021 1037   CHOLHDL 2.1 06/11/2021 1037   CHOLHDL 2.5 05/20/2016 1452   VLDL 26 05/20/2016 1452   LDLCALC 103 (H) 06/11/2021 1037    CBC    Component Value Date/Time   WBC 5.3 04/08/2023 1653   WBC 6.5 01/13/2023 0752   RBC 3.90 04/08/2023 1653   RBC 2.47 (L) 01/13/2023 0752   HGB 11.4 04/08/2023 1653   HCT 34.3 04/08/2023 1653   PLT 369 04/08/2023 1653   MCV 88 04/08/2023 1653   MCH 29.2 04/08/2023 1653   MCH 34.0 01/13/2023 0752   MCHC 33.2 04/08/2023 1653   MCHC 34.1 01/13/2023 0752   RDW 20.2 (H) 04/08/2023 1653   LYMPHSABS 1.0 04/08/2023 1653   MONOABS 0.5 10/21/2022 0333   EOSABS 0.1 04/08/2023 1653   BASOSABS 0.1 04/08/2023 1653    ASSESSMENT AND PLAN: 1. Periumbilical abdominal pain (Primary) Patient presenting with intermittent abdominal pain in the midsection and periumbilical over the past several days.  History is significant for EtOH abuse and GI bleed last year due to duodenal ulcer.  Differential diagnoses include pancreatitis, gastritis/ulcer.  We will put her on pantoprazole .  We will check lipase and chemistry.  Strongly advised her to cut back on alcohol use or quit altogether.  Advised that she should drink no more than 1-2 12 oz bees a day.  Discussed health risks associated with alcohol abuse. Advised to be seen in the emergency room if pain gets worse. - Comprehensive metabolic panel with GFR - Lipase - pantoprazole  (PROTONIX ) 40 MG tablet; Take 1 tablet (40 mg total) by mouth daily.  Dispense: 30 tablet; Refill: 2  2. Alcohol use disorder See #1 above   Patient was given the opportunity to ask questions.  Patient verbalized understanding of the plan and was able to repeat key elements of the plan.   This documentation was completed using Paediatric nurse.  Any transcriptional errors are unintentional.  Orders Placed This Encounter  Procedures    Comprehensive metabolic panel with GFR   Lipase     Requested Prescriptions   Signed Prescriptions Disp Refills   pantoprazole  (PROTONIX ) 40 MG tablet 30 tablet 2    Sig: Take 1 tablet (40 mg total) by mouth daily.    Return in about 6 weeks (around 02/06/2024) for Give 6 wks f/u with Dr. Newlin for chronic disease management.  Concetta Dee, MD, FACP

## 2023-12-26 NOTE — Telephone Encounter (Signed)
 Chief Complaint: abdominal pain Symptoms: abdominal pain  Frequency: 2-3 days Pertinent Negatives: Patient denies black, tarry stools, vomiting, vomiting blood, weakness, dizziness, pallor, CP, SOB, diarrhea, constipation, fever Disposition: [] ED /[] Urgent Care (no appt availability in office) / [x] Appointment(In office/virtual)/ []  [facility]/ [] Home Care/ [] Refused Recommended Disposition /[] [facility]/ []  Follow-up with PCP Additional Notes: Pt reports 5/10 intermittent lower abdominal pain for 2-3 days. Pt has a hx of gastric ulcers and a GI bleed. RN spoke to both pt and pt's significant other Jonah. Jonah mentioned he is concerned the pt is "sneaking alcohol." Pt denies weakness, dizziness, pallor, black, tarry stools, hematemesis, vomiting, CP, SOB, fever, diarrhea, constipation. LBM was today and normal. Jonah checks pt's stools every day for a possible bleed. Pt reports she has to sit up in bed and hold her stomach in order to fall asleep. RN advised pt she needs to be seen today within 4 hours. RN scheduled pt for today at 1530. Pt and Jonah agreeable to that plan. RN advised them both that if the pt develops worsening pain, vomiting, hematemesis, dark tarry stools, weakness, dizziness, CP, SOB she needs to go to the ED.    Copied from CRM [id number]. Topic: Clinical - Red Word Triage >> Dec 26, 2023 12:41 PM Bearl Botts E wrote: Red Word that prompted transfer to Nurse Triage: Abdominal pain Reason for Disposition  [1] MILD-MODERATE pain AND [2] constant AND [3] present > 2 hours  Answer Assessment - Initial Assessment Questions 1. LOCATION: "Where does it hurt?"      "When I sit up in the bed I hold my stomach all night long", endorses lower abdominal pain 2. RADIATION: "Does the pain shoot anywhere else?" (e.g., chest, back)     Just her stomach 3. ONSET: "When did the pain begin?" (e.g., minutes, hours or days ago)      2-3 days 4. SUDDEN: "Gradual or sudden  onset?"     Suddenly  5. PATTERN "Does the pain come and go, or is it constant?"    - If it comes and goes: "How long does it last?" "Do you have pain now?"     (Note: Comes and goes means the pain is intermittent. It goes away completely between bouts.)    - If constant: "Is it getting better, staying the same, or getting worse?"      (Note: Constant means the pain never goes away completely; most serious pain is constant and gets worse.)      Comes and goes 6. SEVERITY: "How bad is the pain?"  (e.g., Scale 1-10; mild, moderate, or severe)    - MILD (1-3): Doesn't interfere with normal activities, abdomen soft and not tender to touch.     - MODERATE (4-7): Interferes with normal activities or awakens from sleep, abdomen tender to touch.     - SEVERE (8-10): Excruciating pain, doubled over, unable to do any normal activities.       5/10 7. RECURRENT SYMPTOM: "Have you ever had this type of stomach pain before?" If Yes, ask: "When was the last time?" and "What happened that time?"      Yes - hx of ulcers 8. CAUSE: "What do you think is causing the stomach pain?"     Hx of gastric ulcer 10. OTHER SYMPTOMS: "Do you have any other symptoms?" (e.g., back pain, diarrhea, fever, urination pain, vomiting)       "She sits up in the bed holding her stomach", boyfriend states she  is "sneaking alcohol." Fell in the bathroom "a while back" "that's when we found out she had an ulcer, her BP was real low", they gave her a unit of blood". Hx of ulcers. Denies weakness or dizziness. Denies pallor. Denies black stools. Denies constipation and diarrhea. LBM was this AM - "pretty good one." No vomiting. Denies urinary symptoms. No fever. No CP. No SOB - "but when my stomach hurts I have to sit up in the bed and hold my stomach in order to get my stomach right"  Protocols used: Abdominal Pain - [facility]

## 2023-12-26 NOTE — Telephone Encounter (Signed)
 Noted.

## 2023-12-27 LAB — COMPREHENSIVE METABOLIC PANEL WITH GFR
ALT: 10 IU/L (ref 0–32)
AST: 23 IU/L (ref 0–40)
Albumin: 4.5 g/dL (ref 3.9–4.9)
Alkaline Phosphatase: 79 IU/L (ref 44–121)
BUN/Creatinine Ratio: 17 (ref 12–28)
BUN: 13 mg/dL (ref 8–27)
Bilirubin Total: 0.2 mg/dL (ref 0.0–1.2)
CO2: 19 mmol/L — ABNORMAL LOW (ref 20–29)
Calcium: 9.8 mg/dL (ref 8.7–10.3)
Chloride: 99 mmol/L (ref 96–106)
Creatinine, Ser: 0.77 mg/dL (ref 0.57–1.00)
Globulin, Total: 3.3 g/dL (ref 1.5–4.5)
Glucose: 79 mg/dL (ref 70–99)
Potassium: 4.3 mmol/L (ref 3.5–5.2)
Sodium: 138 mmol/L (ref 134–144)
Total Protein: 7.8 g/dL (ref 6.0–8.5)
eGFR: 84 mL/min/{1.73_m2} (ref >59)

## 2023-12-27 LAB — LIPASE: Lipase: 53 U/L (ref 14–72)

## 2023-12-29 ENCOUNTER — Ambulatory Visit: Payer: 59 | Admitting: Family Medicine

## 2024-01-01 DIAGNOSIS — J4540 Moderate persistent asthma, uncomplicated: Secondary | ICD-10-CM | POA: Diagnosis not present

## 2024-01-26 ENCOUNTER — Other Ambulatory Visit: Payer: Self-pay | Admitting: Internal Medicine

## 2024-01-26 DIAGNOSIS — I10 Essential (primary) hypertension: Secondary | ICD-10-CM

## 2024-02-01 DIAGNOSIS — J4540 Moderate persistent asthma, uncomplicated: Secondary | ICD-10-CM | POA: Diagnosis not present

## 2024-02-24 ENCOUNTER — Ambulatory Visit: Admitting: Family Medicine

## 2024-02-25 ENCOUNTER — Other Ambulatory Visit: Payer: Self-pay | Admitting: Internal Medicine

## 2024-02-25 ENCOUNTER — Other Ambulatory Visit: Payer: Self-pay | Admitting: Family Medicine

## 2024-02-25 DIAGNOSIS — I10 Essential (primary) hypertension: Secondary | ICD-10-CM

## 2024-02-28 ENCOUNTER — Other Ambulatory Visit: Payer: Self-pay | Admitting: Internal Medicine

## 2024-02-28 DIAGNOSIS — R1033 Periumbilical pain: Secondary | ICD-10-CM

## 2024-02-29 ENCOUNTER — Other Ambulatory Visit: Payer: Self-pay | Admitting: Family Medicine

## 2024-02-29 DIAGNOSIS — J4521 Mild intermittent asthma with (acute) exacerbation: Secondary | ICD-10-CM

## 2024-03-02 DIAGNOSIS — J4540 Moderate persistent asthma, uncomplicated: Secondary | ICD-10-CM | POA: Diagnosis not present

## 2024-04-02 ENCOUNTER — Telehealth: Payer: Self-pay | Admitting: Family Medicine

## 2024-04-02 DIAGNOSIS — J4540 Moderate persistent asthma, uncomplicated: Secondary | ICD-10-CM | POA: Diagnosis not present

## 2024-04-02 NOTE — Telephone Encounter (Signed)
 Called patient, no answer. Left voicemail confirming upcoming appointment on 04/05/2024 1:50 pm Provided callback number for any questions or changes.

## 2024-04-05 ENCOUNTER — Ambulatory Visit: Attending: Family Medicine | Admitting: Family Medicine

## 2024-04-27 ENCOUNTER — Other Ambulatory Visit: Payer: Self-pay | Admitting: Family Medicine

## 2024-04-27 DIAGNOSIS — J4521 Mild intermittent asthma with (acute) exacerbation: Secondary | ICD-10-CM

## 2024-05-10 ENCOUNTER — Telehealth: Payer: Self-pay | Admitting: Family Medicine

## 2024-05-10 NOTE — Telephone Encounter (Signed)
 Pt phone not in service to confirmed appt 9/9

## 2024-05-11 ENCOUNTER — Ambulatory Visit: Admitting: Family Medicine

## 2024-05-28 ENCOUNTER — Other Ambulatory Visit: Payer: Self-pay | Admitting: Family Medicine

## 2024-05-28 DIAGNOSIS — G4709 Other insomnia: Secondary | ICD-10-CM

## 2024-05-28 NOTE — Telephone Encounter (Signed)
 Requested medications are due for refill today.  yes  Requested medications are on the active medications list.  yes  Last refill. 10/08/2023 #90 0 rf  Future visit scheduled.   yes  Notes to clinic.  Rx signed by Dr. Pamella    Requested Prescriptions  Pending Prescriptions Disp Refills   hydrOXYzine  (ATARAX ) 25 MG tablet [Pharmacy Med Name: HYDROXYZINE  HCL 25 MG TABLET] 90 tablet 0    Sig: TAKE 1 TABLET BY MOUTH EVERY DAY AT BEDTIME AS NEEDED     Ear, Nose, and Throat:  Antihistamines 2 Passed - 05/28/2024  3:44 PM      Passed - Cr in normal range and within 360 days    Creat  Date Value Ref Range Status  05/20/2016 0.55 0.50 - 1.05 mg/dL Final    Comment:      For patients > or = 67 years of age: The upper reference limit for Creatinine is approximately 13% higher for people identified as African-American.      Creatinine, Ser  Date Value Ref Range Status  12/26/2023 0.77 0.57 - 1.00 mg/dL Final         Passed - Valid encounter within last 12 months    Recent Outpatient Visits           5 months ago Periumbilical abdominal pain   [facility] [place]. [facility] [place] Barnie NOVAK, [place]   7 months ago Essential hypertension   [facility] [place]. [facility] [place] Barnie NOVAK, [place]   1 year ago Other insomnia   [facility] [place]. [facility] Delbert Clam, MD   2 years ago Hypokalemia   [facility] [place]. [facility] [place], Jon HERO, [place]   2 years ago Melena   [facility] [place]. [facility] Delbert Clam, MD

## 2024-06-02 DIAGNOSIS — J4540 Moderate persistent asthma, uncomplicated: Secondary | ICD-10-CM | POA: Diagnosis not present

## 2024-06-03 NOTE — Progress Notes (Signed)
 Angel Hebert                                          MRN: [id number]   06/03/2024   The VBCI Quality Team Specialist reviewed this patient medical record for the purposes of chart review for care gap closure. The following were reviewed: chart review for care gap closure-breast cancer screening.    VBCI Quality Team

## 2024-06-14 ENCOUNTER — Ambulatory Visit: Admitting: Family Medicine

## 2024-06-18 ENCOUNTER — Ambulatory Visit: Attending: Family Medicine | Admitting: Family Medicine

## 2024-06-18 ENCOUNTER — Encounter: Payer: Self-pay | Admitting: Family Medicine

## 2024-06-18 VITALS — BP 152/84 | HR 101 | Temp 98.5°F | Ht 67.0 in | Wt 120.8 lb

## 2024-06-18 DIAGNOSIS — I10 Essential (primary) hypertension: Secondary | ICD-10-CM

## 2024-06-18 DIAGNOSIS — Z1231 Encounter for screening mammogram for malignant neoplasm of breast: Secondary | ICD-10-CM | POA: Diagnosis not present

## 2024-06-18 DIAGNOSIS — R1033 Periumbilical pain: Secondary | ICD-10-CM

## 2024-06-18 DIAGNOSIS — M1711 Unilateral primary osteoarthritis, right knee: Secondary | ICD-10-CM | POA: Diagnosis not present

## 2024-06-18 DIAGNOSIS — J069 Acute upper respiratory infection, unspecified: Secondary | ICD-10-CM

## 2024-06-18 DIAGNOSIS — J4520 Mild intermittent asthma, uncomplicated: Secondary | ICD-10-CM

## 2024-06-18 DIAGNOSIS — M79604 Pain in right leg: Secondary | ICD-10-CM | POA: Diagnosis not present

## 2024-06-18 DIAGNOSIS — G4709 Other insomnia: Secondary | ICD-10-CM | POA: Diagnosis not present

## 2024-06-18 DIAGNOSIS — E2839 Other primary ovarian failure: Secondary | ICD-10-CM

## 2024-06-18 DIAGNOSIS — Z23 Encounter for immunization: Secondary | ICD-10-CM

## 2024-06-18 DIAGNOSIS — Z1211 Encounter for screening for malignant neoplasm of colon: Secondary | ICD-10-CM | POA: Diagnosis not present

## 2024-06-18 MED ORDER — AMLODIPINE BESYLATE 10 MG PO TABS: 10.0000 mg | ORAL_TABLET | Freq: Every day | ORAL | 1 refills | Status: DC

## 2024-06-18 MED ORDER — LOSARTAN POTASSIUM 100 MG PO TABS: 100.0000 mg | ORAL_TABLET | Freq: Every day | ORAL | 1 refills | Status: DC

## 2024-06-18 MED ORDER — HYDROXYZINE HCL 25 MG PO TABS: 25.0000 mg | ORAL_TABLET | Freq: Three times a day (TID) | ORAL | 1 refills | Status: DC | PRN

## 2024-06-18 MED ORDER — MELOXICAM 7.5 MG PO TABS: 7.5000 mg | ORAL_TABLET | Freq: Every day | ORAL | 1 refills | Status: DC

## 2024-06-18 MED ORDER — ALBUTEROL SULFATE (2.5 MG/3ML) 0.083% IN NEBU: 2.5000 mg | INHALATION_SOLUTION | Freq: Four times a day (QID) | RESPIRATORY_TRACT | 3 refills | Status: AC | PRN

## 2024-06-18 MED ORDER — PANTOPRAZOLE SODIUM 40 MG PO TBEC: 40.0000 mg | DELAYED_RELEASE_TABLET | Freq: Every day | ORAL | 1 refills | Status: DC

## 2024-06-18 MED ORDER — BENZONATATE 100 MG PO CAPS: 100.0000 mg | ORAL_CAPSULE | Freq: Three times a day (TID) | ORAL | 0 refills | Status: AC

## 2024-06-18 NOTE — Patient Instructions (Signed)
 VISIT SUMMARY:  During today's visit, we discussed your persistent cough, right knee pain, asthma, hypertension, and cholesterol management. We also reviewed your general health maintenance needs.  YOUR PLAN:  -PROLONGED COUGH WITH PHLEGM: You have a persistent cough with phlegm that may be due to a prolonged upper respiratory infection or possibly pneumonia. We will do a chest x-ray to check for pneumonia and have prescribed Tessalon  Perles to help manage your cough.  -RIGHT KNEE OSTEOARTHRITIS: Your right knee pain is due to advanced arthritis. We are referring you to an orthopedic specialist for further evaluation. In the meantime, you should continue using your knee brace and we have prescribed meloxicam  to help manage the pain.  -ASTHMA: Your asthma is currently managed with an inhaler and nebulizer. We will refill your nebulizer medication to ensure you have it when needed.  -HYPERTENSION: Your blood pressure is still high despite taking amlodipine . We are increasing your dose from 5 mg to 10 mg and will recheck your blood pressure to see if it improves.  -HYPERLIPIDEMIA: You have high cholesterol and have not been taking your prescribed atorvastatin . We will do blood work to check your cholesterol levels and discussed the importance of taking your medication as prescribed.  -GENERAL HEALTH MAINTENANCE: You are due for several routine health screenings. We have ordered a bone density scan, a colonoscopy, and a mammogram to ensure your overall health is monitored.  INSTRUCTIONS:  Please follow up with the chest x-ray and blood work as soon as possible. Schedule appointments for the bone density scan, colonoscopy, and mammogram. We will also need to recheck your blood pressure after you start the increased dose of amlodipine . Make sure to take your atorvastatin  as prescribed and use the knee brace and meloxicam  for your knee pain. Follow up with the orthopedic specialist for your knee  evaluation.

## 2024-06-18 NOTE — Progress Notes (Signed)
 Subjective:  Patient ID: Angel Hebert, female    DOB: [birth year]  Age: 68 y.o. MRN: [id number]  CC: Medical Management of Chronic Issues (Right leg pain)     Discussed the use of AI scribe software for clinical note transcription with the patient, who gave verbal consent to proceed.  History of Present Illness Angel Hebert is a 68 year old female with a history of hypertension, hyperlipidemia, asthma, allergic rhinitis, nasal polyps (Status post sinus surgery in 10/2016), GERD, bilateral knee osteoarthritis (status post left total knee arthroplasty in 09/2016), CHF (EF 45 to 50% from 2020)  who presents with a persistent cough and right knee pain.  She has experienced a persistent productive cough with phlegm since September. There is no wheezing or shortness of breath currently, though these occur occasionally. She denies fever, chills, or body aches. She uses an inhaler as needed for asthma and has a nebulizer without . No over-the-counter medications have been used for the cough.  Right knee pain affects the whole leg. She has arthritis in both knees and previously had surgery on the left knee. An x-ray in April of the previous year confirmed arthritis in the right knee. She has not had surgery on the right knee but uses a knee brace for support.  Current medications include amlodipine , taken this morning but her blood pressure is elevated.  She has been prescribed atorvastatin  for cholesterol but has not been taking it. She is due for blood work to check cholesterol levels. No medication for pain currently.    Past Medical History:  Diagnosis Date   Arthritis    Asthma    last year in December   Dyspnea    GERD (gastroesophageal reflux disease)    High cholesterol    Hypertension    Pneumonia 01/2017    Past Surgical History:  Procedure Laterality Date   ABDOMINAL HYSTERECTOMY     BIOPSY  10/22/2022   Procedure: BIOPSY;  Surgeon: Eda Iha, MD;  Location:  [facility] ENDOSCOPY;  Service: Gastroenterology;;   BIOPSY  01/12/2023   Procedure: BIOPSY;  Surgeon: Albertus Gordy HERO, MD;  Location: [facility] ENDOSCOPY;  Service: Gastroenterology;;   ESOPHAGOGASTRODUODENOSCOPY (EGD) WITH PROPOFOL  N/A 10/22/2022   Procedure: ESOPHAGOGASTRODUODENOSCOPY (EGD) WITH PROPOFOL ;  Surgeon: Eda Iha, MD;  Location: [facility] ENDOSCOPY;  Service: Gastroenterology;  Laterality: N/A;   ESOPHAGOGASTRODUODENOSCOPY (EGD) WITH PROPOFOL  N/A 01/12/2023   Procedure: ESOPHAGOGASTRODUODENOSCOPY (EGD) WITH PROPOFOL ;  Surgeon: Albertus Gordy HERO, MD;  Location: [facility] ENDOSCOPY;  Service: Gastroenterology;  Laterality: N/A;   FEMUR IM NAIL Left 04/17/2020   Procedure: INTRAMEDULLARY (IM) RETROGRADE FEMORAL NAILING;  Surgeon: Addie Cordella Hamilton, MD;  Location: [facility] OR;  Service: Orthopedics;  Laterality: Left;   nasal polyps     removed just this past tuesday at Outpt facility over by [facility]   TOTAL KNEE ARTHROPLASTY Left 10/01/2016   Procedure: LEFT TOTAL KNEE ARTHROPLASTY;  Surgeon: Hamilton Cordella Addie, MD;  Location: [facility] OR;  Service: Orthopedics;  Laterality: Left;    Family History  Problem Relation Age of Onset   Diabetes Sister    Healthy Mother    Healthy Father    Colon cancer Neg Hx    Breast cancer Neg Hx     Social History   Socioeconomic History   Marital status: Single    Spouse name: Not on file   Number of children: Not on file   Years of education: Not on file   Highest education level: Not on file  Occupational  History   Not on file  Tobacco Use   Smoking status: Never   Smokeless tobacco: Never  Vaping Use   Vaping status: Never Used  Substance and Sexual Activity   Alcohol use: Yes    Alcohol/week: 1.0 standard drink of alcohol    Types: 1 Shots of liquor per week    Comment: socially   Drug use: No   Sexual activity: Not on file  Other Topics Concern   Not on file  Social History Narrative   Not on file   Social Drivers of Health   Financial Resource  Strain: Low Risk  (10/31/2023)   Overall Financial Resource Strain (CARDIA)    Difficulty of Paying Living Expenses: Not hard at all  Food Insecurity: No Food Insecurity (10/31/2023)   Hunger Vital Sign    Worried About Running Out of Food in the Last Year: Never true    Ran Out of Food in the Last Year: Never true  Transportation Needs: Unmet Transportation Needs (10/31/2023)   PRAPARE - Transportation    Lack of Transportation (Medical): Yes    Lack of Transportation (Non-Medical): Yes  Physical Activity: Insufficiently Active (10/31/2023)   Exercise Vital Sign    Days of Exercise per Week: 2 days    Minutes of Exercise per Session: 20 min  Stress: No Stress Concern Present (10/31/2023)   [employer] of Occupational Health - Occupational Stress Questionnaire    Feeling of Stress : Not at all  Social Connections: Moderately Integrated (10/31/2023)   Social Connection and Isolation Panel    Frequency of Communication with Friends and Family: Once a week    Frequency of Social Gatherings with Friends and Family: Three times a week    Attends Religious Services: 1 to 4 times per year    Active Member of Clubs or Organizations: Yes    Attends Banker Meetings: 1 to 4 times per year    Marital Status: Widowed    No Known Allergies  Outpatient Medications Prior to Visit  Medication Sig Dispense Refill   albuterol  (VENTOLIN  HFA) 108 (90 Base) MCG/ACT inhaler Inhale 2 puffs into the lungs every 6 (six) hours as needed for wheezing or shortness of breath. 34 g 5   fluticasone  (FLONASE ) 50 MCG/ACT nasal spray Place 1 spray into both nostrils daily.     folic acid  (FOLVITE ) 1 MG tablet Take 1 tablet (1 mg total) by mouth daily. 30 tablet 0   albuterol  (PROVENTIL ) (2.5 MG/3ML) 0.083% nebulizer solution Take 3 mLs (2.5 mg total) by nebulization every 6 (six) hours as needed for wheezing or shortness of breath. 75 mL 3   amLODipine  (NORVASC ) 5 MG tablet TAKE 1 TABLET BY MOUTH  EVERY DAY 90 tablet 1   hydrOXYzine  (ATARAX ) 25 MG tablet TAKE 1 TABLET BY MOUTH EVERY DAY AT BEDTIME AS NEEDED 90 tablet 0   losartan  (COZAAR ) 100 MG tablet TAKE 1 TABLET BY MOUTH EVERY DAY 90 tablet 1   pantoprazole  (PROTONIX ) 40 MG tablet TAKE 1 TABLET BY MOUTH EVERY DAY 90 tablet 1   atorvastatin  (LIPITOR) 20 MG tablet TAKE 1 TABLET BY MOUTH DAILY AT 6PM (Patient not taking: Reported on 06/18/2024) 90 tablet 0   montelukast  (SINGULAIR ) 10 MG tablet Take 1 tablet (10 mg total) by mouth daily. (Patient not taking: Reported on 06/18/2024) 30 tablet 3   thiamine  (VITAMIN B1) 100 MG tablet Take 1 tablet (100 mg total) by mouth daily. (Patient not taking: Reported on 06/18/2024) 30  tablet 0   WIXELA INHUB 500-50 MCG/ACT AEPB INHALE 1 PUFF INTO THE LUNGS IN THE MORNING AND AT BEDTIME. (Patient not taking: Reported on 06/18/2024) 60 each 2   benzonatate  (TESSALON ) 100 MG capsule Take 1 capsule (100 mg total) by mouth every 8 (eight) hours. (Patient not taking: Reported on 06/18/2024) 21 capsule 0   Bismuth /Metronidaz/Tetracyclin 140-125-125 MG CAPS TAKE 3 CAPSULES BY MOUTH IN THE MORNING, AT NOON, IN THE EVENING, AND AT BEDTIME. (Patient not taking: Reported on 06/18/2024) 120 capsule 0   HYDROcodone -acetaminophen  (NORCO/VICODIN) 5-325 MG tablet Take 1 tablet by mouth every 6 (six) hours as needed for severe pain. (Patient not taking: Reported on 06/18/2024) 10 tablet 0   MAGnesium -Oxide 400 (240 Mg) MG tablet Take 1 tablet (400 mg total) by mouth daily. (Patient not taking: Reported on 06/18/2024) 5 tablet 0   potassium chloride  SA (KLOR-CON  M) 20 MEQ tablet Take 2 tablets (40 mEq total) by mouth daily. (Patient not taking: Reported on 06/18/2024) 2 tablet 0   No facility-administered medications prior to visit.     ROS Review of Systems  Constitutional:  Negative for activity change and appetite change.  HENT:  Negative for sinus pressure and sore throat.   Respiratory:  Positive for cough.  Negative for chest tightness, shortness of breath and wheezing.   Cardiovascular:  Negative for chest pain and palpitations.  Gastrointestinal:  Negative for abdominal distention, abdominal pain and constipation.  Genitourinary: Negative.   Musculoskeletal:        See HPI  Psychiatric/Behavioral:  Negative for behavioral problems and dysphoric mood.     Objective:  BP (!) 152/84   Pulse (!) 101   Temp 98.5 F (36.9 C) (Oral)   Ht 5' 7 (1.702 m)   Wt 120 lb 12.8 oz (54.8 kg)   SpO2 98%   BMI 18.92 kg/m      06/18/2024    3:02 PM 06/18/2024    2:36 PM 12/26/2023    3:43 PM  BP/Weight  Systolic BP 152 156 158  Diastolic BP 84 81 84  Wt. (Lbs)  120.8 115  BMI  18.92 kg/m2 18.01 kg/m2      Physical Exam Constitutional:      Appearance: She is well-developed.  Cardiovascular:     Rate and Rhythm: Tachycardia present.     Heart sounds: Normal heart sounds. No murmur heard. Pulmonary:     Effort: Pulmonary effort is normal.     Breath sounds: Normal breath sounds. No wheezing or rales.  Chest:     Chest wall: No tenderness.  Abdominal:     General: Bowel sounds are normal. There is no distension.     Palpations: Abdomen is soft. There is no mass.     Tenderness: There is no abdominal tenderness.  Musculoskeletal:        General: Normal range of motion.     Right lower leg: No edema.     Left lower leg: No edema.  Neurological:     Mental Status: She is alert and oriented to person, place, and time.  Psychiatric:        Mood and Affect: Mood normal.        Latest Ref Rng & Units 12/26/2023    5:08 PM 04/08/2023    4:53 PM 01/13/2023    7:52 AM  CMP  Glucose 70 - 99 mg/dL 79   897   BUN 8 - 27 mg/dL 13   9   Creatinine 9.42 -  1.00 mg/dL 9.22   9.27   Sodium 865 - 144 mmol/L 138   132   Potassium 3.5 - 5.2 mmol/L 4.3  3.6  3.0   Chloride 96 - 106 mmol/L 99   94   CO2 20 - 29 mmol/L 19   26   Calcium  8.7 - 10.3 mg/dL 9.8   9.1   Total Protein 6.0 - 8.5 g/dL  7.8     Total Bilirubin 0.0 - 1.2 mg/dL 0.2     Alkaline Phos 44 - 121 IU/L 79     AST 0 - 40 IU/L 23     ALT 0 - 32 IU/L 10       Lipid Panel     Component Value Date/Time   CHOL 226 (H) 06/11/2021 1037   TRIG 87 06/11/2021 1037   HDL 108 06/11/2021 1037   CHOLHDL 2.1 06/11/2021 1037   CHOLHDL 2.5 05/20/2016 1452   VLDL 26 05/20/2016 1452   LDLCALC 103 (H) 06/11/2021 1037    CBC    Component Value Date/Time   WBC 5.3 04/08/2023 1653   WBC 6.5 01/13/2023 0752   RBC 3.90 04/08/2023 1653   RBC 2.47 (L) 01/13/2023 0752   HGB 11.4 04/08/2023 1653   HCT 34.3 04/08/2023 1653   PLT 369 04/08/2023 1653   MCV 88 04/08/2023 1653   MCH 29.2 04/08/2023 1653   MCH 34.0 01/13/2023 0752   MCHC 33.2 04/08/2023 1653   MCHC 34.1 01/13/2023 0752   RDW 20.2 (H) 04/08/2023 1653   LYMPHSABS 1.0 04/08/2023 1653   MONOABS 0.5 10/21/2022 0333   EOSABS 0.1 04/08/2023 1653   BASOSABS 0.1 04/08/2023 1653    Lab Results  Component Value Date   HGBA1C 5.5 07/25/2021        Assessment & Plan Upper respiratory tract infection Prolonged cough with phlegm Possible prolonged upper respiratory infection. Differential includes pneumonia. - Order chest x-ray to rule out pneumonia. - Prescribe Tessalon  Perles for cough.  Right knee osteoarthritis and right leg pain End-stage arthritis in the right knee per orthopedic notes from 2024. Referred to orthopedics for further evaluation. - Refer to orthopedics for further evaluation. - Prescribe meloxicam  for pain management. - Advise use of knee brace.  Asthma Asthma managed with as-needed inhaler and nebulizer. Needs refill for nebulizer medication. - Refill nebulizer medication.  Hypertension Blood pressure is elevated despite taking amlodipine . - Increase amlodipine  dose from 5 mg to 10 mg. - Recheck blood pressure. -Counseled on blood pressure goal of less than 130/80, low-sodium, DASH diet, medication compliance, 150 minutes of moderate  intensity exercise per week. Discussed medication compliance, adverse effects.   Hyperlipidemia Not taking atorvastatin  as prescribed. Due for blood work to check cholesterol levels. - Order blood work to check cholesterol levels. - Educate on the importance of taking atorvastatin .  General Health Maintenance Estrogen deficiency/screening for breast cancer Due for bone density scan, colonoscopy, and mammogram. Discussed osteoporosis screening and colon cancer screening. - Order bone density scan. - Order colonoscopy. - Order mammogram.      Meds ordered this encounter  Medications   meloxicam  (MOBIC ) 7.5 MG tablet    Sig: Take 1 tablet (7.5 mg total) by mouth daily.    Dispense:  30 tablet    Refill:  1   albuterol  (PROVENTIL ) (2.5 MG/3ML) 0.083% nebulizer solution    Sig: Take 3 mLs (2.5 mg total) by nebulization every 6 (six) hours as needed for wheezing or shortness of breath.  Dispense:  75 mL    Refill:  3   benzonatate  (TESSALON ) 100 MG capsule    Sig: Take 1 capsule (100 mg total) by mouth every 8 (eight) hours.    Dispense:  21 capsule    Refill:  0   hydrOXYzine  (ATARAX ) 25 MG tablet    Sig: Take 1 tablet (25 mg total) by mouth every 8 (eight) hours as needed.    Dispense:  90 tablet    Refill:  1   losartan  (COZAAR ) 100 MG tablet    Sig: Take 1 tablet (100 mg total) by mouth daily.    Dispense:  90 tablet    Refill:  1   pantoprazole  (PROTONIX ) 40 MG tablet    Sig: Take 1 tablet (40 mg total) by mouth daily.    Dispense:  90 tablet    Refill:  1   amLODipine  (NORVASC ) 10 MG tablet    Sig: Take 1 tablet (10 mg total) by mouth daily.    Dispense:  90 tablet    Refill:  1    Follow-up: Return in about 3 months (around 09/18/2024) for Chronic medical conditions.       Corrina Sabin, MD, FAAFP. [facility] and Wellness [place], [place] [phone]   06/18/2024, 5:00 PM

## 2024-07-01 ENCOUNTER — Ambulatory Visit: Admitting: Orthopaedic Surgery

## 2024-07-03 DIAGNOSIS — J4540 Moderate persistent asthma, uncomplicated: Secondary | ICD-10-CM | POA: Diagnosis not present

## 2024-07-05 ENCOUNTER — Ambulatory Visit: Admitting: Family Medicine

## 2024-07-13 ENCOUNTER — Ambulatory Visit: Attending: Family Medicine

## 2024-07-13 VITALS — Ht 67.0 in | Wt 150.0 lb

## 2024-07-13 DIAGNOSIS — Z0000 Encounter for general adult medical examination without abnormal findings: Secondary | ICD-10-CM | POA: Diagnosis not present

## 2024-07-13 NOTE — Patient Instructions (Signed)
 Angel Hebert,  Thank you for taking the time for your Medicare Wellness Visit. I appreciate your continued commitment to your health goals. Please review the care plan we discussed, and feel free to reach out if I can assist you further.  Please note that Annual Wellness Visits do not include a physical exam. Some assessments may be limited, especially if the visit was conducted virtually. If needed, we may recommend an in-person follow-up with your provider.  Ongoing Care Seeing your primary care provider every 3 to 6 months helps us  monitor your health and provide consistent, personalized care.   Referrals If a referral was made during today's visit and you haven't received any updates within two weeks, please contact the referred provider directly to check on the status.  Recommended Screenings:  Health Maintenance  Topic Date Due   Medicare Annual Wellness Visit  Never done   Colon Cancer Screening  01/17/2019   Breast Cancer Screening  12/18/2019   DEXA scan (bone density measurement)  Never done   Zoster (Shingles) Vaccine (2 of 2) 04/27/2024   COVID-19 Vaccine (1 - 2025-26 season) Never done   DTaP/Tdap/Td vaccine (3 - Td or Tdap) 12/11/2024*   Pneumococcal Vaccine for age over 33 (2 of 2 - PCV) 12/11/2024*   Flu Shot  Completed   Hepatitis C Screening  Completed   Meningitis B Vaccine  Aged Out  *Topic was postponed. The date shown is not the original due date.       07/13/2024    9:58 AM  Advanced Directives  Does Patient Have a Medical Advance Directive? No  Would patient like information on creating a medical advance directive? No - Patient declined    Vision: Annual vision screenings are recommended for early detection of glaucoma, cataracts, and diabetic retinopathy. These exams can also reveal signs of chronic conditions such as diabetes and high blood pressure.  Dental: Annual dental screenings help detect early signs of oral cancer, gum disease, and other  conditions linked to overall health, including heart disease and diabetes.  Please see the attached documents for additional preventive care recommendations.

## 2024-07-13 NOTE — Progress Notes (Cosign Needed Addendum)
 I connected with  Angel Hebert on 07/13/24 by a audio enabled telemedicine application and verified that I am speaking with the correct person using two identifiers.  Patient Location: Home  Provider Location: Office/Clinic  Persons Participating in Visit: Patient.  I discussed the limitations of evaluation and management by telemedicine. The patient expressed understanding and agreed to proceed.   Vital Signs: Because this visit was a virtual/telehealth visit, some criteria may be missing or patient reported. Any vitals not documented were not able to be obtained and vitals that have been documented are patient reported.   If you're able to add these things as option in the [employer], we won't need it ... but for those who refuse to use the template, I guess it does need to be updated    Subjective:   Angel Hebert is a 68 y.o. female who presents for a Medicare Annual Wellness Visit.  Allergies (verified) Patient has no known allergies.   History: Past Medical History:  Diagnosis Date   Arthritis    Asthma    last year in December   Dyspnea    GERD (gastroesophageal reflux disease)    High cholesterol    Hypertension    Pneumonia 01/2017   Past Surgical History:  Procedure Laterality Date   ABDOMINAL HYSTERECTOMY     BIOPSY  10/22/2022   Procedure: BIOPSY;  Surgeon: Eda Iha, MD;  Location: [facility] ENDOSCOPY;  Service: Gastroenterology;;   BIOPSY  01/12/2023   Procedure: BIOPSY;  Surgeon: Albertus Gordy HERO, MD;  Location: [facility] ENDOSCOPY;  Service: Gastroenterology;;   ESOPHAGOGASTRODUODENOSCOPY (EGD) WITH PROPOFOL  N/A 10/22/2022   Procedure: ESOPHAGOGASTRODUODENOSCOPY (EGD) WITH PROPOFOL ;  Surgeon: Eda Iha, MD;  Location: [facility] ENDOSCOPY;  Service: Gastroenterology;  Laterality: N/A;   ESOPHAGOGASTRODUODENOSCOPY (EGD) WITH PROPOFOL  N/A 01/12/2023   Procedure: ESOPHAGOGASTRODUODENOSCOPY (EGD) WITH PROPOFOL ;  Surgeon: Albertus Gordy HERO, MD;   Location: [facility] ENDOSCOPY;  Service: Gastroenterology;  Laterality: N/A;   FEMUR IM NAIL Left 04/17/2020   Procedure: INTRAMEDULLARY (IM) RETROGRADE FEMORAL NAILING;  Surgeon: Addie Cordella [facility], MD;  Location: [facility] OR;  Service: Orthopedics;  Laterality: Left;   nasal polyps     removed just this past tuesday at Outpt facility over by [facility]   TOTAL KNEE ARTHROPLASTY Left 10/01/2016   Procedure: LEFT TOTAL KNEE ARTHROPLASTY;  Surgeon: [facility] Cordella Addie, MD;  Location: [facility] OR;  Service: Orthopedics;  Laterality: Left;   Family History  Problem Relation Age of Onset   Diabetes Sister    Healthy Mother    Healthy Father    Colon cancer Neg Hx    Breast cancer Neg Hx    Social History   Occupational History   Not on file  Tobacco Use   Smoking status: Never   Smokeless tobacco: Never  Vaping Use   Vaping status: Never Used  Substance and Sexual Activity   Alcohol use: Yes    Alcohol/week: 1.0 standard drink of alcohol    Types: 1 Shots of liquor per week    Comment: socially   Drug use: No   Sexual activity: Not on file   Tobacco Counseling Counseling given: Not Answered  SDOH Screenings   Food Insecurity: No Food Insecurity (07/13/2024)  Housing: Low Risk  (07/13/2024)  Transportation Needs: Unmet Transportation Needs (07/13/2024)  Utilities: Not At Risk (07/13/2024)  Alcohol Screen: Low Risk  (10/31/2023)  Depression (PHQ2-9): Low Risk  (07/13/2024)  Financial Resource Strain: Low Risk  (10/31/2023)  Physical Activity: Inactive (07/13/2024)  Social Connections: Moderately Integrated (07/13/2024)  Stress: No Stress Concern Present (07/13/2024)  Tobacco Use: Low Risk  (07/13/2024)  Health Literacy: Adequate Health Literacy (07/13/2024)   Depression Screen    07/13/2024   10:04 AM 12/26/2023    3:50 PM 10/31/2023   11:18 AM 04/08/2023    4:25 PM 08/22/2021    1:49 PM 06/11/2021    9:51 AM 01/21/2020   11:10 AM  PHQ 2/9 Scores  PHQ - 2 Score 0 0 2 2 0 1 3  PHQ- 9  Score 0 0  6  8  1  3  3       Data saved with a previous flowsheet row definition     Goals Addressed             This Visit's Progress    07/13/2024: To be able to get some relief from this right knee pain and not to have to use the cane.         Visit info / Clinical Intake: Medicare Wellness Visit Type:: Initial Annual Wellness Visit Persons participating in visit:: patient Medicare Wellness Visit Mode:: Telephone If telephone:: video declined Because this visit was a virtual/telehealth visit:: pt reported vitals If Telephone or Video please confirm:: I connected with the patient using audio enabled telemedicine application and verified that I am speaking with the correct person using two identifiers; I discussed the limitations of evaluation and management by telemedicine; The patient expressed understanding and agreed to proceed Patient Location:: HOME Provider Location:: [facility] Information given by:: patient Interpreter Needed?: No Pre-visit prep was completed: yes AWV questionnaire completed by patient prior to visit?: no Living arrangements:: lives with spouse/significant other Patient's Overall Health Status Rating: very good Typical amount of pain: (!) a lot Does pain affect daily life?: (!) yes Are you currently prescribed opioids?: no  Dietary Habits and Nutritional Risks How many meals a day?: 3 (DRINKS PLENTY OF WATER ) Eats fruit and vegetables daily?: (!) no Most meals are obtained by: having others provide food In the last 2 weeks, have you had any of the following?: none Diabetic:: no  Functional Status Activities of Daily Living (to include ambulation/medication): Independent Feeding: Independent Dressing/Grooming: Independent Bathing: Independent Toileting: Independent Transfer: Independent Ambulation: Independent with device- listed below Home Assistive Devices/Equipment: Walker (specify Type) Medication Administration:  Independent Home Management: Independent Manage your own finances?: yes Primary transportation is: facility / other (TRANSIT BUS) Concerns about vision?: no *vision screening is required for WTM* Concerns about hearing?: no  Fall Screening Falls in the past year?: 0 Number of falls in past year: 0 Was there an injury with Fall?: 0 Fall Risk Category Calculator: 0 Patient Fall Risk Level: Low Fall Risk  Fall Risk Patient at Risk for Falls Due to: No Fall Risks; Impaired balance/gait Fall risk Follow up: Falls evaluation completed; Education provided  Home and Transportation Safety: All rugs have non-skid backing?: N/A, no rugs All stairs or steps have railings?: N/A, no stairs Grab bars in the bathtub or shower?: yes Have non-skid surface in bathtub or shower?: yes Good home lighting?: yes Regular seat belt use?: yes Hospital stays in the last year:: no  Cognitive Assessment Difficulty concentrating, remembering, or making decisions? : no (BSE: NONE) Will 6CIT or Mini Cog be Completed: no 6CIT or Mini Cog Declined: patient alert, oriented, able to answer questions appropriately and recall recent events  Advance Directives (For Healthcare) Does Patient Have a Medical Advance Directive?: No Would patient like  information on creating a medical advance directive?: No - Patient declined  Reviewed/Updated  Reviewed/Updated: Reviewed All (Medical, Surgical, Family, Medications, Allergies, Care Teams, Patient Goals)        Objective:    Today's Vitals   07/13/24 0956  Weight: 150 lb (68 kg)  Height: 5' 7 (1.702 m)  PainSc: 10-Worst pain ever  PainLoc: Leg   Body mass index is 23.49 kg/m.  Current Medications (verified) Outpatient Encounter Medications as of 07/13/2024  Medication Sig   albuterol  (PROVENTIL ) (2.5 MG/3ML) 0.083% nebulizer solution Take 3 mLs (2.5 mg total) by nebulization every 6 (six) hours as needed for wheezing or shortness of breath.   albuterol   (VENTOLIN  HFA) 108 (90 Base) MCG/ACT inhaler Inhale 2 puffs into the lungs every 6 (six) hours as needed for wheezing or shortness of breath.   amLODipine  (NORVASC ) 10 MG tablet Take 1 tablet (10 mg total) by mouth daily.   atorvastatin  (LIPITOR) 20 MG tablet TAKE 1 TABLET BY MOUTH DAILY AT 6PM (Patient not taking: Reported on 06/18/2024)   benzonatate  (TESSALON ) 100 MG capsule Take 1 capsule (100 mg total) by mouth every 8 (eight) hours.   fluticasone  (FLONASE ) 50 MCG/ACT nasal spray Place 1 spray into both nostrils daily.   folic acid  (FOLVITE ) 1 MG tablet Take 1 tablet (1 mg total) by mouth daily.   hydrOXYzine  (ATARAX ) 25 MG tablet Take 1 tablet (25 mg total) by mouth every 8 (eight) hours as needed.   losartan  (COZAAR ) 100 MG tablet Take 1 tablet (100 mg total) by mouth daily.   meloxicam  (MOBIC ) 7.5 MG tablet Take 1 tablet (7.5 mg total) by mouth daily.   montelukast  (SINGULAIR ) 10 MG tablet Take 1 tablet (10 mg total) by mouth daily. (Patient not taking: Reported on 06/18/2024)   pantoprazole  (PROTONIX ) 40 MG tablet Take 1 tablet (40 mg total) by mouth daily.   thiamine  (VITAMIN B1) 100 MG tablet Take 1 tablet (100 mg total) by mouth daily. (Patient not taking: Reported on 06/18/2024)   WIXELA INHUB 500-50 MCG/ACT AEPB INHALE 1 PUFF INTO THE LUNGS IN THE MORNING AND AT BEDTIME. (Patient not taking: Reported on 06/18/2024)   [DISCONTINUED] traZODone  (DESYREL ) 50 MG tablet Take 1 tablet (50 mg total) by mouth at bedtime as needed for sleep.   No facility-administered encounter medications on file as of 07/13/2024.   Hearing/Vision screen Hearing Screening - Comments:: Adequate hearing, no hearing aids. Vision Screening - Comments:: Patient does not wears any eyeglasses. Never been to eye doctor. Immunizations and Health Maintenance Health Maintenance  Topic Date Due   Colonoscopy  01/17/2019   Mammogram  12/18/2019   DEXA SCAN  Never done   Zoster Vaccines- Shingrix (2 of 2)  04/27/2024   COVID-19 Vaccine (1 - 2025-26 season) Never done   DTaP/Tdap/Td (3 - Td or Tdap) 12/11/2024 (Originally 09/06/2022)   Pneumococcal Vaccine: 50+ Years (2 of 2 - PCV) 12/11/2024 (Originally 07/19/2020)   Medicare Annual Wellness (AWV)  07/13/2025   Influenza Vaccine  Completed   Hepatitis C Screening  Completed   Meningococcal B Vaccine  Aged Out        Assessment/Plan:  This is a routine wellness examination for Broseley.  Patient Care Team: Delbert Clam, MD as PCP - General (Family Medicine) Jerri Kay HERO, MD as Attending Physician (Orthopedic Surgery)  I have personally reviewed and noted the following in the patient's chart:   Medical and social history Use of alcohol, tobacco or illicit drugs  Current medications and  supplements including opioid prescriptions. Functional ability and status Nutritional status Physical activity Advanced directives List of other physicians Hospitalizations, surgeries, and ER visits in previous 12 months Vitals Screenings to include cognitive, depression, and falls Referrals and appointments  No orders of the defined types were placed in this encounter.  In addition, I have reviewed and discussed with patient certain preventive protocols, quality metrics, and best practice recommendations. A written personalized care plan for preventive services as well as general preventive health recommendations were provided to patient.   Roz LOISE Fuller, LPN   [id number]/[id number]   Return in about 1 year (around 07/13/2025) for Medicare wellness.  After Visit Summary: (Pick Up) Due to this being a telephonic visit, with patients personalized plan was offered to patient and patient has requested to Pick up at office.  Nurse Notes: This patient is aware of all care gaps.  Patient only wants to see about her knee pain right now.

## 2024-07-14 ENCOUNTER — Ambulatory Visit: Admitting: Orthopaedic Surgery

## 2024-07-18 ENCOUNTER — Other Ambulatory Visit: Payer: Self-pay | Admitting: Family Medicine

## 2024-07-18 DIAGNOSIS — J4540 Moderate persistent asthma, uncomplicated: Secondary | ICD-10-CM

## 2024-07-21 ENCOUNTER — Ambulatory Visit

## 2024-07-27 ENCOUNTER — Ambulatory Visit (INDEPENDENT_AMBULATORY_CARE_PROVIDER_SITE_OTHER): Admitting: Orthopaedic Surgery

## 2024-07-27 ENCOUNTER — Other Ambulatory Visit: Payer: Self-pay

## 2024-07-27 DIAGNOSIS — M1711 Unilateral primary osteoarthritis, right knee: Secondary | ICD-10-CM

## 2024-07-27 MED ORDER — LIDOCAINE HCL 1 % IJ SOLN
2.0000 mL | INTRAMUSCULAR | Status: AC | PRN
  Administered 2024-07-27: 2 mL

## 2024-07-27 MED ORDER — BUPIVACAINE HCL 0.5 % IJ SOLN
2.0000 mL | INTRAMUSCULAR | Status: AC | PRN
  Administered 2024-07-27: 2 mL via INTRA_ARTICULAR

## 2024-07-27 MED ORDER — METHYLPREDNISOLONE ACETATE 40 MG/ML IJ SUSP
40.0000 mg | INTRAMUSCULAR | Status: AC | PRN
  Administered 2024-07-27: 40 mg via INTRA_ARTICULAR

## 2024-07-27 NOTE — Progress Notes (Signed)
 Office Visit Note   Patient: Angel Hebert           Date of Birth: [birth year]           MRN: [id number] Visit Date: 07/27/2024              Requested by: Delbert Clam, MD [address] [place],  [place] [address] PCP: Delbert Clam, MD   Assessment & Plan: Visit Diagnoses:  1. Arthritis of right knee     Plan: History of Present Illness Angel Hebert is a 68 year old female who presents with right knee pain.  She has had persistent right knee pain for six months, worse every night and with touch or pressure. She has used a rollator for mobility for four months. A prior cortisone injection gave only temporary relief, and Tylenol  has not helped her pain.  Right knee exam is shows small effusion.  Joint line tenderness.  Negative mcmurray's.    Assessment and Plan Primary osteoarthritis of the right knee Chronic right knee pain not effectively managed with cortisone injection. Tylenol  ineffective. Discussed surgical options for future consideration based on pain tolerance and quality of life. - Administered cortisone injection to the right knee. - Discussed potential for knee replacement surgery if pain becomes intolerable.  Follow-Up Instructions: No follow-ups on file.   Orders:  Orders Placed This Encounter  Procedures   Large Joint Inj   XR KNEE 3 VIEW RIGHT   No orders of the defined types were placed in this encounter.     Procedures: Large Joint Inj: R knee on 07/27/2024 5:21 PM Indications: pain Details: 22 G needle  Arthrogram: No  Medications: 40 mg methylPREDNISolone  acetate 40 MG/ML; 2 mL lidocaine  1 %; 2 mL bupivacaine  0.5 % Consent was given by the patient. Patient was prepped and draped in the usual sterile fashion.       Clinical Data: No additional findings.   Subjective: Chief Complaint  Patient presents with   Right Knee - Pain    HPI  Review of Systems  Constitutional: Negative.   HENT: Negative.     Eyes: Negative.   Respiratory: Negative.    Cardiovascular: Negative.   Endocrine: Negative.   Musculoskeletal: Negative.   Neurological: Negative.   Hematological: Negative.   Psychiatric/Behavioral: Negative.    All other systems reviewed and are negative.    Objective: Vital Signs: There were no vitals taken for this visit.  Physical Exam Vitals and nursing note reviewed.  Constitutional:      Appearance: She is well-developed.  HENT:     Head: Atraumatic.     Nose: Nose normal.  Eyes:     Extraocular Movements: Extraocular movements intact.  Cardiovascular:     Pulses: Normal pulses.  Pulmonary:     Effort: Pulmonary effort is normal.  Abdominal:     Palpations: Abdomen is soft.  Musculoskeletal:     Cervical back: Neck supple.  Skin:    General: Skin is warm.     Capillary Refill: Capillary refill takes less than 2 seconds.  Neurological:     Mental Status: She is alert. Mental status is at baseline.  Psychiatric:        Behavior: Behavior normal.        Thought Content: Thought content normal.        Judgment: Judgment normal.     Ortho Exam  Specialty Comments:  No specialty comments available.  Imaging: XR KNEE 3 VIEW RIGHT Result  Date: 07/27/2024 X-rays demonstrate severe tricompartmental osteoarthritis.  Bone-on-bone joint space narrowing.  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   Duodenal ulcer 10/22/2022   Acute GI bleeding 10/21/2022   GI bleed 10/21/2022   Protein-calorie malnutrition, severe 10/21/2022   Acute on chronic blood loss anemia 10/21/2022   Hyponatremia 10/21/2022   Hypomagnesemia 10/21/2022   Hyperphosphatemia 10/21/2022   AKI (acute kidney injury)    Alcohol induced acute pancreatitis 07/26/2021    Gastritis 07/25/2021   Periprosthetic fracture around internal prosthetic left knee joint 04/17/2020   Closed fracture of left distal femur (HCC) 04/16/2020   Symptomatic anemia 07/12/2019   Hypokalemia 07/12/2019   Lobar pneumonia 07/12/2019   Insomnia 03/03/2017   History of total knee arthroplasty, left 11/27/2016   Arthritis of knee 10/01/2016   Chronic pansinusitis 09/05/2016   Physical deconditioning    Anemia, iron  deficiency    Pressure ulcer 10/14/2015   CAP (community acquired pneumonia)    Sepsis due to Streptococcus pneumoniae (HCC)    Hyperlipidemia 03/02/2007   Essential hypertension 03/02/2007   Allergic rhinitis 03/02/2007   Asthma 03/02/2007   GERD 03/02/2007   Past Medical History:  Diagnosis Date   Arthritis    Asthma    last year in December   Dyspnea    GERD (gastroesophageal reflux disease)    High cholesterol    Hypertension    Pneumonia 01/2017    Family History  Problem Relation Age of Onset   Diabetes Sister    Healthy Mother    Healthy Father    Colon cancer Neg Hx    Breast cancer Neg Hx     Past Surgical History:  Procedure Laterality Date   ABDOMINAL HYSTERECTOMY     BIOPSY  10/22/2022   Procedure: BIOPSY;  Surgeon: Eda Iha, MD;  Location: [facility] ENDOSCOPY;  Service: Gastroenterology;;   BIOPSY  01/12/2023   Procedure: BIOPSY;  Surgeon: Albertus Gordy HERO, MD;  Location: [facility] ENDOSCOPY;  Service: Gastroenterology;;   ESOPHAGOGASTRODUODENOSCOPY (EGD) WITH PROPOFOL  N/A 10/22/2022   Procedure: ESOPHAGOGASTRODUODENOSCOPY (EGD) WITH PROPOFOL ;  Surgeon: Eda Iha, MD;  Location: [facility] ENDOSCOPY;  Service: Gastroenterology;  Laterality: N/A;   ESOPHAGOGASTRODUODENOSCOPY (EGD) WITH PROPOFOL  N/A 01/12/2023   Procedure: ESOPHAGOGASTRODUODENOSCOPY (EGD) WITH PROPOFOL ;  Surgeon: Albertus Gordy HERO, MD;  Location: [facility] ENDOSCOPY;  Service: Gastroenterology;  Laterality: N/A;   FEMUR IM NAIL Left 04/17/2020   Procedure: INTRAMEDULLARY (IM) RETROGRADE  FEMORAL NAILING;  Surgeon: Addie Cordella [facility], MD;  Location: [facility] OR;  Service: Orthopedics;  Laterality: Left;   nasal polyps     removed just this past tuesday at Outpt facility over by [facility]   TOTAL KNEE ARTHROPLASTY Left 10/01/2016   Procedure: LEFT TOTAL KNEE ARTHROPLASTY;  Surgeon: [facility] Cordella Addie, MD;  Location: [facility] OR;  Service: Orthopedics;  Laterality: Left;   Social History   Occupational History   Not on file  Tobacco Use   Smoking status: Never   Smokeless tobacco: Never  Vaping Use   Vaping status: Never Used  Substance and Sexual Activity   Alcohol use: Yes    Alcohol/week: 1.0 standard drink of alcohol    Types: 1 Shots of liquor per week    Comment: socially   Drug use: No   Sexual activity: Not on  file

## 2024-07-30 ENCOUNTER — Other Ambulatory Visit: Payer: Self-pay | Admitting: Family Medicine

## 2024-07-30 DIAGNOSIS — G4709 Other insomnia: Secondary | ICD-10-CM

## 2024-08-02 ENCOUNTER — Other Ambulatory Visit: Payer: Self-pay | Admitting: Family Medicine

## 2024-08-02 DIAGNOSIS — J4521 Mild intermittent asthma with (acute) exacerbation: Secondary | ICD-10-CM

## 2024-09-04 ENCOUNTER — Other Ambulatory Visit: Payer: Self-pay | Admitting: Family Medicine

## 2024-09-04 DIAGNOSIS — G4709 Other insomnia: Secondary | ICD-10-CM

## 2024-09-20 ENCOUNTER — Telehealth: Payer: Self-pay | Admitting: Family Medicine

## 2024-09-20 NOTE — Telephone Encounter (Signed)
 Call cannot be comp as dial contacted pt  to confirmed appt

## 2024-09-21 ENCOUNTER — Ambulatory Visit: Attending: Family Medicine | Admitting: Family Medicine

## 2024-09-21 ENCOUNTER — Encounter: Payer: Self-pay | Admitting: Family Medicine

## 2024-09-21 VITALS — BP 94/57 | HR 90 | Temp 98.0°F | Ht 67.0 in | Wt 105.8 lb

## 2024-09-21 DIAGNOSIS — R1033 Periumbilical pain: Secondary | ICD-10-CM

## 2024-09-21 DIAGNOSIS — I10 Essential (primary) hypertension: Secondary | ICD-10-CM

## 2024-09-21 DIAGNOSIS — E7800 Pure hypercholesterolemia, unspecified: Secondary | ICD-10-CM

## 2024-09-21 DIAGNOSIS — J4540 Moderate persistent asthma, uncomplicated: Secondary | ICD-10-CM

## 2024-09-21 DIAGNOSIS — M1711 Unilateral primary osteoarthritis, right knee: Secondary | ICD-10-CM | POA: Diagnosis not present

## 2024-09-21 DIAGNOSIS — Z96652 Presence of left artificial knee joint: Secondary | ICD-10-CM

## 2024-09-21 DIAGNOSIS — J4521 Mild intermittent asthma with (acute) exacerbation: Secondary | ICD-10-CM

## 2024-09-21 MED ORDER — AMLODIPINE BESYLATE 5 MG PO TABS: 5.0000 mg | ORAL_TABLET | Freq: Every day | ORAL | 1 refills | Status: AC

## 2024-09-21 MED ORDER — FLUTICASONE-SALMETEROL 500-50 MCG/ACT IN AEPB: 1.0000 | INHALATION_SPRAY | Freq: Two times a day (BID) | RESPIRATORY_TRACT | 2 refills | Status: AC

## 2024-09-21 MED ORDER — FOLIC ACID 1 MG PO TABS: 1.0000 mg | ORAL_TABLET | Freq: Every day | ORAL | 1 refills | Status: AC

## 2024-09-21 MED ORDER — ATORVASTATIN CALCIUM 20 MG PO TABS: 20.0000 mg | ORAL_TABLET | Freq: Every day | ORAL | 1 refills | Status: AC

## 2024-09-21 MED ORDER — PANTOPRAZOLE SODIUM 40 MG PO TBEC: 40.0000 mg | DELAYED_RELEASE_TABLET | Freq: Every day | ORAL | 1 refills | Status: AC

## 2024-09-21 MED ORDER — ALBUTEROL SULFATE HFA 108 (90 BASE) MCG/ACT IN AERS: 2.0000 | INHALATION_SPRAY | Freq: Four times a day (QID) | RESPIRATORY_TRACT | 3 refills | Status: AC | PRN

## 2024-09-21 NOTE — Progress Notes (Signed)
 "  Subjective:  Patient ID: Angel Hebert, female    DOB: 1955/09/20  Age: 69 y.o. MRN: [id number]  CC: Medical Management of Chronic Issues (Right leg pain./Medication refills./Request stomach medication  )     Discussed the use of AI scribe software for clinical note transcription with the patient, who gave verbal consent to proceed.  History of Present Illness Angel Hebert is a 69 year old female with a history of hypertension, hyperlipidemia, asthma, allergic rhinitis, nasal polyps (Status post sinus surgery in 10/2016), GERD, bilateral knee osteoarthritis (status post left total knee arthroplasty in 09/2016), CHF (EF 45 to 50% from 2020)  who presents with abdominal pain.  She has generalized abdominal pain mainly with eating and no pain between meals. She denies nausea and vomiting. She has constipation and acid reflux with sour taste. She uses over-the-counter Pepcid and has a prescription for pantoprazole  that she recently could not fill.  She does have a history of peptic ulcers.  She takes amlodipine  10 mg daily for hypertension but is not taking losartan  and she has a soft blood pressure today.  At her last visit her blood pressure was 152/84 and of note she has lost 15 pounds in the last 2 months.    She uses an inhaler daily for asthma and is not taking her prescribed Singulair  but is using her ICS/LABA.  She has chronic bilateral knee pain, worse in the left knee. She had a left knee replacement and uses Tylenol  for pain.  She is currently under the care of of orthopedics Dr. Jerri and review of his last note indicated x-ray of the right knee revealed presence of bone-on-bone and she has received cortisone injection in that knee with plans for surgery with persisting pain.  She has alcohol use and was prescribed folic acid  and thiamine  for related nutritional deficiencies but is not taking these supplements.    Past Medical History:  Diagnosis Date   Arthritis     Asthma    last year in December   Dyspnea    GERD (gastroesophageal reflux disease)    High cholesterol    Hypertension    Pneumonia 01/2017    Past Surgical History:  Procedure Laterality Date   ABDOMINAL HYSTERECTOMY     BIOPSY  10/22/2022   Procedure: BIOPSY;  Surgeon: Eda Iha, MD;  Location: [facility] ENDOSCOPY;  Service: Gastroenterology;;   BIOPSY  01/12/2023   Procedure: BIOPSY;  Surgeon: Albertus Gordy HERO, MD;  Location: [facility] ENDOSCOPY;  Service: Gastroenterology;;   ESOPHAGOGASTRODUODENOSCOPY (EGD) WITH PROPOFOL  N/A 10/22/2022   Procedure: ESOPHAGOGASTRODUODENOSCOPY (EGD) WITH PROPOFOL ;  Surgeon: Eda Iha, MD;  Location: [facility] ENDOSCOPY;  Service: Gastroenterology;  Laterality: N/A;   ESOPHAGOGASTRODUODENOSCOPY (EGD) WITH PROPOFOL  N/A 01/12/2023   Procedure: ESOPHAGOGASTRODUODENOSCOPY (EGD) WITH PROPOFOL ;  Surgeon: Albertus Gordy HERO, MD;  Location: [facility] ENDOSCOPY;  Service: Gastroenterology;  Laterality: N/A;   FEMUR IM NAIL Left 04/17/2020   Procedure: INTRAMEDULLARY (IM) RETROGRADE FEMORAL NAILING;  Surgeon: Addie Cordella Hamilton, MD;  Location: [facility] OR;  Service: Orthopedics;  Laterality: Left;   nasal polyps     removed just this past tuesday at Outpt facility over by [facility]   TOTAL KNEE ARTHROPLASTY Left 10/01/2016   Procedure: LEFT TOTAL KNEE ARTHROPLASTY;  Surgeon: Hamilton Cordella Addie, MD;  Location: [facility] OR;  Service: Orthopedics;  Laterality: Left;    Family History  Problem Relation Age of Onset   Diabetes Sister    Healthy Mother    Healthy Father  Colon cancer Neg Hx    Breast cancer Neg Hx     Social History   Socioeconomic History   Marital status: Single    Spouse name: Not on file   Number of children: Not on file   Years of education: Not on file   Highest education level: Not on file  Occupational History   Not on file  Tobacco Use   Smoking status: Never   Smokeless tobacco: Never  Vaping Use   Vaping status: Never Used  Substance and Sexual  Activity   Alcohol use: Yes    Alcohol/week: 1.0 standard drink of alcohol    Types: 1 Shots of liquor per week    Comment: socially   Drug use: No   Sexual activity: Not on file  Other Topics Concern   Not on file  Social History Narrative   Not on file   Social Drivers of Health   Tobacco Use: Low Risk (07/13/2024)   Patient History    Smoking Tobacco Use: Never    Smokeless Tobacco Use: Never    Passive Exposure: Not on file  Financial Resource Strain: Low Risk (10/31/2023)   Overall Financial Resource Strain (CARDIA)    Difficulty of Paying Living Expenses: Not hard at all  Food Insecurity: No Food Insecurity (07/13/2024)   Epic    Worried About Programme Researcher, Broadcasting/film/video in the Last Year: Never true    Ran Out of Food in the Last Year: Never true  Transportation Needs: Unmet Transportation Needs (07/13/2024)   Epic    Lack of Transportation (Medical): Yes    Lack of Transportation (Non-Medical): Yes  Physical Activity: Inactive (07/13/2024)   Exercise Vital Sign    Days of Exercise per Week: 0 days    Minutes of Exercise per Session: 0 min  Stress: No Stress Concern Present (07/13/2024)   [employer] of Occupational Health - Occupational Stress Questionnaire    Feeling of Stress: Not at all  Social Connections: Moderately Integrated (07/13/2024)   Social Connection and Isolation Panel    Frequency of Communication with Friends and Family: Once a week    Frequency of Social Gatherings with Friends and Family: Three times a week    Attends Religious Services: 1 to 4 times per year    Active Member of Clubs or Organizations: Yes    Attends Banker Meetings: 1 to 4 times per year    Marital Status: Widowed  Depression (PHQ2-9): Low Risk (07/13/2024)   Depression (PHQ2-9)    PHQ-2 Score: 0  Alcohol Screen: Low Risk (10/31/2023)   Alcohol Screen    Last Alcohol Screening Score (AUDIT): 5  Housing: Low Risk (07/13/2024)   Epic    Unable to Pay for Housing  in the Last Year: No    Number of Times Moved in the Last Year: 0    Homeless in the Last Year: No  Utilities: Not At Risk (07/13/2024)   Epic    Threatened with loss of utilities: No  Health Literacy: Adequate Health Literacy (07/13/2024)   B1300 Health Literacy    Frequency of need for help with medical instructions: Never    Allergies[1]  Outpatient Medications Prior to Visit  Medication Sig Dispense Refill   albuterol  (PROVENTIL ) (2.5 MG/3ML) 0.083% nebulizer solution Take 3 mLs (2.5 mg total) by nebulization every 6 (six) hours as needed for wheezing or shortness of breath. 75 mL 3   benzonatate  (TESSALON ) 100 MG capsule Take 1 capsule (100  mg total) by mouth every 8 (eight) hours. 21 capsule 0   albuterol  (VENTOLIN  HFA) 108 (90 Base) MCG/ACT inhaler TAKE 2 PUFFS BY MOUTH EVERY 6 HOURS AS NEEDED FOR WHEEZE OR SHORTNESS OF BREATH 34 each 0   amLODipine  (NORVASC ) 10 MG tablet Take 1 tablet (10 mg total) by mouth daily. 90 tablet 1   atorvastatin  (LIPITOR) 20 MG tablet TAKE 1 TABLET BY MOUTH DAILY AT 6PM 90 tablet 0   pantoprazole  (PROTONIX ) 40 MG tablet Take 1 tablet (40 mg total) by mouth daily. 90 tablet 1   WIXELA INHUB 500-50 MCG/ACT AEPB INHALE 1 PUFF INTO THE LUNGS IN THE MORNING AND AT BEDTIME. 60 each 2   fluticasone  (FLONASE ) 50 MCG/ACT nasal spray Place 1 spray into both nostrils daily. (Patient not taking: Reported on 09/21/2024)     montelukast  (SINGULAIR ) 10 MG tablet Take 1 tablet (10 mg total) by mouth daily. (Patient not taking: Reported on 06/18/2024) 30 tablet 3   thiamine  (VITAMIN B1) 100 MG tablet Take 1 tablet (100 mg total) by mouth daily. (Patient not taking: Reported on 06/18/2024) 30 tablet 0   folic acid  (FOLVITE ) 1 MG tablet Take 1 tablet (1 mg total) by mouth daily. (Patient not taking: Reported on 09/21/2024) 30 tablet 0   hydrOXYzine  (ATARAX ) 25 MG tablet TAKE 1 TABLET BY MOUTH EVERY DAY AT BEDTIME AS NEEDED (Patient not taking: Reported on 09/21/2024) 90  tablet 0   losartan  (COZAAR ) 100 MG tablet Take 1 tablet (100 mg total) by mouth daily. (Patient not taking: Reported on 09/21/2024) 90 tablet 1   meloxicam  (MOBIC ) 7.5 MG tablet Take 1 tablet (7.5 mg total) by mouth daily. (Patient not taking: Reported on 09/21/2024) 30 tablet 1   No facility-administered medications prior to visit.     ROS Review of Systems  Constitutional:  Positive for appetite change. Negative for activity change.  HENT:  Negative for sinus pressure and sore throat.   Respiratory:  Negative for chest tightness, shortness of breath and wheezing.   Cardiovascular:  Negative for chest pain and palpitations.  Gastrointestinal:  Positive for abdominal pain. Negative for abdominal distention and constipation.  Genitourinary: Negative.   Musculoskeletal:        See HPI  Psychiatric/Behavioral:  Negative for behavioral problems and dysphoric mood.     Objective:  BP (!) 94/57   Pulse 90   Temp 98 F (36.7 C) (Oral)   Ht 5' 7 (1.702 m)   Wt 105 lb 12.8 oz (48 kg)   SpO2 100%   BMI 16.57 kg/m      09/21/2024   11:24 AM 09/21/2024   10:27 AM 07/13/2024    9:56 AM  BP/Weight  Systolic BP 94 93   Diastolic BP 57 54   Wt. (Lbs)  105.8 150  BMI  16.57 kg/m2 23.49 kg/m2    Wt Readings from Last 3 Encounters:  09/21/24 105 lb 12.8 oz (48 kg)  07/13/24 150 lb (68 kg)  06/18/24 120 lb 12.8 oz (54.8 kg)       Physical Exam Constitutional:      Appearance: She is well-developed.  Cardiovascular:     Rate and Rhythm: Normal rate.     Heart sounds: Normal heart sounds. No murmur heard. Pulmonary:     Effort: Pulmonary effort is normal.     Breath sounds: Normal breath sounds. No wheezing or rales.  Chest:     Chest wall: No tenderness.  Abdominal:     General:  Bowel sounds are normal. There is no distension.     Palpations: Abdomen is soft. There is no mass.     Tenderness: There is no abdominal tenderness.  Musculoskeletal:     Right lower leg: No  edema.     Left lower leg: No edema.     Comments: Slight edema of left knee with healed vertical surgical scar.  No tenderness on range of motion of left knee Crepitus on range of motion of right knee with slight tenderness and range of motion  Neurological:     Mental Status: She is alert and oriented to person, place, and time.  Psychiatric:        Mood and Affect: Mood normal.        Latest Ref Rng & Units 12/26/2023    5:08 PM 04/08/2023    4:53 PM 01/13/2023    7:52 AM  CMP  Glucose 70 - 99 mg/dL 79   897   BUN 8 - 27 mg/dL 13   9   Creatinine 9.42 - 1.00 mg/dL 9.22   9.27   Sodium 865 - 144 mmol/L 138   132   Potassium 3.5 - 5.2 mmol/L 4.3  3.6  3.0   Chloride 96 - 106 mmol/L 99   94   CO2 20 - 29 mmol/L 19   26   Calcium  8.7 - 10.3 mg/dL 9.8   9.1   Total Protein 6.0 - 8.5 g/dL 7.8     Total Bilirubin 0.0 - 1.2 mg/dL 0.2     Alkaline Phos 44 - 121 IU/L 79     AST 0 - 40 IU/L 23     ALT 0 - 32 IU/L 10       Lipid Panel     Component Value Date/Time   CHOL 226 (H) 06/11/2021 1037   TRIG 87 06/11/2021 1037   HDL 108 06/11/2021 1037   CHOLHDL 2.1 06/11/2021 1037   CHOLHDL 2.5 05/20/2016 1452   VLDL 26 05/20/2016 1452   LDLCALC 103 (H) 06/11/2021 1037    CBC    Component Value Date/Time   WBC 5.3 04/08/2023 1653   WBC 6.5 01/13/2023 0752   RBC 3.90 04/08/2023 1653   RBC 2.47 (L) 01/13/2023 0752   HGB 11.4 04/08/2023 1653   HCT 34.3 04/08/2023 1653   PLT 369 04/08/2023 1653   MCV 88 04/08/2023 1653   MCH 29.2 04/08/2023 1653   MCH 34.0 01/13/2023 0752   MCHC 33.2 04/08/2023 1653   MCHC 34.1 01/13/2023 0752   RDW 20.2 (H) 04/08/2023 1653   LYMPHSABS 1.0 04/08/2023 1653   MONOABS 0.5 10/21/2022 0333   EOSABS 0.1 04/08/2023 1653   BASOSABS 0.1 04/08/2023 1653    Lab Results  Component Value Date   HGBA1C 5.5 07/25/2021       Assessment & Plan Periumbilical abdominal pain with dyspepsia Intermittent pain suggestive of GERD with possible ulcer  exacerbation. Acid reflux symptoms present. - Refilled pantoprazole  prescription. - Ordered Helicobacter pylori test. - Patient was unable to complete Helicobacter pylori test due to vomiting during procedure -Alcoholic gastritis is also consideration - If symptoms persist consider CT abdomen  Right knee osteoarthritis/history of total left knee replacement Chronic pain. Surgical options discussed if pain becomes intolerable. -She continues to experience left knee pain despite left TKA - Referred to orthopedic surgeon for evaluation.  Moderate persistent asthma Asthma not well controlled, requiring daily albuterol . Previous use of Advair noted. - Prescribed Advair inhaler twice  daily. - Instructed to use albuterol  inhaler as needed. - She has not been adherent with Singulair  and has been advised to use this daily  Essential hypertension Blood pressure low, likely due to weight loss. Currently on amlodipine  10 mg daily. - Reduced amlodipine  dose to 5 mg daily.  Hyperlipidemia Not taking atorvastatin  as prescribed. No recent cholesterol check. - Refilled atorvastatin  prescription. - If lipid panel is elevated will not adjust regimen as she has not been adherent with her statin  Alcohol use disorder Intermittent alcohol use. Not taking prescribed supplements. - Refilled folic acid  and thiamine  prescription. - Advised to quit alcohol use.  General health maintenance Colonoscopy and mammogram screenings pending. Bone density screening ordered in October. - Provided contact information for colonoscopy scheduling. - Provided contact information for mammogram scheduling.      Meds ordered this encounter  Medications   amLODipine  (NORVASC ) 5 MG tablet    Sig: Take 1 tablet (5 mg total) by mouth daily.    Dispense:  90 tablet    Refill:  1    Dose decrease   atorvastatin  (LIPITOR) 20 MG tablet    Sig: Take 1 tablet (20 mg total) by mouth daily.    Dispense:  90 tablet     Refill:  1   pantoprazole  (PROTONIX ) 40 MG tablet    Sig: Take 1 tablet (40 mg total) by mouth daily.    Dispense:  90 tablet    Refill:  1   albuterol  (VENTOLIN  HFA) 108 (90 Base) MCG/ACT inhaler    Sig: Inhale 2 puffs into the lungs every 6 (six) hours as needed for wheezing or shortness of breath.    Dispense:  18 g    Refill:  3   fluticasone -salmeterol (WIXELA INHUB) 500-50 MCG/ACT AEPB    Sig: Inhale 1 puff into the lungs 2 (two) times daily. in the morning and at bedtime.    Dispense:  60 each    Refill:  2   folic acid  (FOLVITE ) 1 MG tablet    Sig: Take 1 tablet (1 mg total) by mouth daily.    Dispense:  90 tablet    Refill:  1    Follow-up: Return in about 6 months (around 03/21/2025) for Chronic medical conditions.       Corrina Sabin, MD, FAAFP. [facility] [place], [place] [phone]   09/21/2024, 1:03 PM    [1] No Known Allergies  "

## 2024-09-21 NOTE — Patient Instructions (Signed)
 Please call to schedule your Colonoscopy: Placed in  [address] [place], [place] [address] PH# [phone]

## 2024-09-23 ENCOUNTER — Ambulatory Visit: Payer: Self-pay | Admitting: Family Medicine

## 2024-09-23 DIAGNOSIS — E876 Hypokalemia: Secondary | ICD-10-CM

## 2024-09-23 MED ORDER — POTASSIUM CHLORIDE CRYS ER 15 MEQ PO TBCR: 30.0000 meq | EXTENDED_RELEASE_TABLET | Freq: Every day | ORAL | 5 refills | Status: AC

## 2024-09-24 ENCOUNTER — Encounter: Payer: Self-pay | Admitting: Internal Medicine

## 2024-09-29 LAB — CMP14+EGFR
ALT: 16 [IU]/L (ref 0–32)
AST: 64 [IU]/L — ABNORMAL HIGH (ref 0–40)
Albumin: 3.8 g/dL — ABNORMAL LOW (ref 3.9–4.9)
Alkaline Phosphatase: 158 [IU]/L — ABNORMAL HIGH (ref 49–135)
BUN/Creatinine Ratio: 6 — ABNORMAL LOW (ref 12–28)
BUN: 6 mg/dL — ABNORMAL LOW (ref 8–27)
Bilirubin Total: 1.1 mg/dL (ref 0.0–1.2)
CO2: 11 mmol/L — ABNORMAL LOW (ref 20–29)
Calcium: 7.6 mg/dL — ABNORMAL LOW (ref 8.7–10.3)
Chloride: 90 mmol/L — ABNORMAL LOW (ref 96–106)
Creatinine, Ser: 1.05 mg/dL — ABNORMAL HIGH (ref 0.57–1.00)
Globulin, Total: 3.5 g/dL (ref 1.5–4.5)
Glucose: 108 mg/dL — ABNORMAL HIGH (ref 70–99)
Potassium: 2.7 mmol/L — ABNORMAL LOW (ref 3.5–5.2)
Sodium: 128 mmol/L — ABNORMAL LOW (ref 134–144)
Total Protein: 7.3 g/dL (ref 6.0–8.5)
eGFR: 58 mL/min/{1.73_m2} — ABNORMAL LOW

## 2024-09-29 LAB — H. PYLORI BREATH COLLECTION

## 2024-09-29 LAB — H. PYLORI BREATH TEST

## 2024-09-29 LAB — LP+NON-HDL CHOLESTEROL
Cholesterol, Total: 174 mg/dL (ref 100–199)
HDL: 115 mg/dL
LDL Chol Calc (NIH): 44 mg/dL (ref 0–99)
Total Non-HDL-Chol (LDL+VLDL): 59 mg/dL (ref 0–129)
Triglycerides: 86 mg/dL (ref 0–149)
VLDL Cholesterol Cal: 15 mg/dL (ref 5–40)

## 2024-10-15 ENCOUNTER — Ambulatory Visit: Payer: Self-pay

## 2025-03-23 ENCOUNTER — Ambulatory Visit: Payer: Self-pay | Admitting: Family Medicine
